# Patient Record
Sex: Female | Born: 1970 | Race: Black or African American | Hispanic: No | Marital: Single
Health system: Midwestern US, Academic
[De-identification: ages and names within clinical notes are randomized; demographics above are authoritative.]

## PROBLEM LIST (undated history)

## (undated) DIAGNOSIS — Z939 Artificial opening status, unspecified: Secondary | ICD-10-CM

## (undated) DIAGNOSIS — C181 Malignant neoplasm of appendix: Secondary | ICD-10-CM

## (undated) DIAGNOSIS — N159 Renal tubulo-interstitial disease, unspecified: Secondary | ICD-10-CM

## (undated) DIAGNOSIS — C19 Malignant neoplasm of rectosigmoid junction: Secondary | ICD-10-CM

## (undated) DIAGNOSIS — C189 Malignant neoplasm of colon, unspecified: Principal | ICD-10-CM

## (undated) HISTORY — PX: RECTOPERITONEAL FISTULA CLOSURE: SHX2314

## (undated) HISTORY — PX: ABDOMINAL SURGERY: SHX537

## (undated) HISTORY — PX: RENAL ARTERY STENT: SHX2321

## (undated) HISTORY — PX: OSTOMY TAKE DOWN: SHX2142

## (undated) HISTORY — DX: Malignant neoplasm of appendix: C18.1

---

## 2009-03-08 ENCOUNTER — Ambulatory Visit: Payer: Self-pay | Admitting: Diagnostic Radiology

## 2009-03-08 ENCOUNTER — Encounter: Payer: Self-pay | Admitting: Emergency Medicine

## 2009-03-09 ENCOUNTER — Inpatient Hospital Stay (HOSPITAL_COMMUNITY): Admission: EM | Admit: 2009-03-09 | Discharge: 2009-03-17 | Payer: Self-pay | Admitting: Emergency Medicine

## 2009-03-20 ENCOUNTER — Ambulatory Visit (HOSPITAL_COMMUNITY): Admission: RE | Admit: 2009-03-20 | Discharge: 2009-03-20 | Payer: Self-pay | Admitting: Surgery

## 2009-03-25 ENCOUNTER — Ambulatory Visit (HOSPITAL_COMMUNITY): Admission: RE | Admit: 2009-03-25 | Discharge: 2009-03-25 | Payer: Self-pay | Admitting: Surgery

## 2009-05-08 ENCOUNTER — Inpatient Hospital Stay (HOSPITAL_COMMUNITY): Admission: RE | Admit: 2009-05-08 | Discharge: 2009-05-18 | Payer: Self-pay | Admitting: Surgery

## 2009-05-08 ENCOUNTER — Encounter (INDEPENDENT_AMBULATORY_CARE_PROVIDER_SITE_OTHER): Payer: Self-pay | Admitting: Surgery

## 2009-05-20 ENCOUNTER — Ambulatory Visit: Payer: Self-pay | Admitting: Hematology and Oncology

## 2009-05-24 ENCOUNTER — Ambulatory Visit: Payer: Self-pay | Admitting: Hematology and Oncology

## 2009-05-30 ENCOUNTER — Ambulatory Visit (HOSPITAL_COMMUNITY): Admission: RE | Admit: 2009-05-30 | Discharge: 2009-05-30 | Payer: Self-pay | Admitting: Hematology and Oncology

## 2009-05-30 LAB — CBC WITH DIFFERENTIAL/PLATELET
HGB: 11.6 g/dL (ref 11.6–15.9)
MCH: 29.1 pg (ref 25.1–34.0)
MCHC: 33.6 g/dL (ref 31.5–36.0)
MCV: 86.4 fL (ref 79.5–101.0)
RBC: 3.98 10*6/uL (ref 3.70–5.45)
RDW: 14.7 % — ABNORMAL HIGH (ref 11.2–14.5)
lymph#: 2.2 10*3/uL (ref 0.9–3.3)

## 2009-05-30 LAB — COMPREHENSIVE METABOLIC PANEL
CO2: 28 mEq/L (ref 19–32)
Creatinine, Ser: 0.68 mg/dL (ref 0.40–1.20)
Glucose, Bld: 93 mg/dL (ref 70–99)
Total Bilirubin: 0.5 mg/dL (ref 0.3–1.2)

## 2009-05-30 LAB — CEA: CEA: 16.6 ng/mL — ABNORMAL HIGH (ref 0.0–5.0)

## 2009-06-03 ENCOUNTER — Ambulatory Visit (HOSPITAL_COMMUNITY): Admission: RE | Admit: 2009-06-03 | Discharge: 2009-06-03 | Payer: Self-pay | Admitting: Hematology and Oncology

## 2009-09-13 ENCOUNTER — Ambulatory Visit: Payer: Self-pay | Admitting: Hematology and Oncology

## 2009-09-17 LAB — COMPREHENSIVE METABOLIC PANEL
ALT: 18 U/L (ref 0–35)
AST: 20 U/L (ref 0–37)
Alkaline Phosphatase: 65 U/L (ref 39–117)
CO2: 26 mEq/L (ref 19–32)
Sodium: 142 mEq/L (ref 135–145)
Total Bilirubin: 0.4 mg/dL (ref 0.3–1.2)
Total Protein: 7.1 g/dL (ref 6.0–8.3)

## 2009-09-17 LAB — CBC WITH DIFFERENTIAL/PLATELET
BASO%: 0.4 % (ref 0.0–2.0)
EOS%: 1.8 % (ref 0.0–7.0)
LYMPH%: 21.6 % (ref 14.0–49.7)
MCH: 28.3 pg (ref 25.1–34.0)
MCHC: 33.1 g/dL (ref 31.5–36.0)
MONO#: 0.5 10*3/uL (ref 0.1–0.9)
MONO%: 10.9 % (ref 0.0–14.0)
Platelets: 398 10*3/uL (ref 145–400)
RBC: 4.32 10*6/uL (ref 3.70–5.45)
WBC: 4.7 10*3/uL (ref 3.9–10.3)

## 2009-09-28 ENCOUNTER — Emergency Department (HOSPITAL_BASED_OUTPATIENT_CLINIC_OR_DEPARTMENT_OTHER): Admission: EM | Admit: 2009-09-28 | Discharge: 2009-09-28 | Payer: Self-pay | Admitting: Emergency Medicine

## 2009-10-02 LAB — COMPREHENSIVE METABOLIC PANEL
Albumin: 4.6 g/dL (ref 3.5–5.2)
BUN: 13 mg/dL (ref 6–23)
CO2: 27 mEq/L (ref 19–32)
Calcium: 10.9 mg/dL — ABNORMAL HIGH (ref 8.4–10.5)
Chloride: 101 mEq/L (ref 96–112)
Creatinine, Ser: 0.85 mg/dL (ref 0.40–1.20)
Glucose, Bld: 122 mg/dL — ABNORMAL HIGH (ref 70–99)
Potassium: 3.8 mEq/L (ref 3.5–5.3)

## 2009-10-03 ENCOUNTER — Ambulatory Visit (HOSPITAL_BASED_OUTPATIENT_CLINIC_OR_DEPARTMENT_OTHER): Admission: RE | Admit: 2009-10-03 | Discharge: 2009-10-03 | Payer: Self-pay | Admitting: Surgery

## 2009-10-05 ENCOUNTER — Emergency Department (HOSPITAL_BASED_OUTPATIENT_CLINIC_OR_DEPARTMENT_OTHER): Admission: EM | Admit: 2009-10-05 | Discharge: 2009-10-05 | Payer: Self-pay | Admitting: Emergency Medicine

## 2009-10-07 ENCOUNTER — Emergency Department (HOSPITAL_BASED_OUTPATIENT_CLINIC_OR_DEPARTMENT_OTHER): Admission: EM | Admit: 2009-10-07 | Discharge: 2009-10-08 | Payer: Self-pay | Admitting: Emergency Medicine

## 2009-10-07 LAB — COMPREHENSIVE METABOLIC PANEL
AST: 12 U/L (ref 0–37)
Albumin: 4.2 g/dL (ref 3.5–5.2)
Alkaline Phosphatase: 61 U/L (ref 39–117)
BUN: 9 mg/dL (ref 6–23)
Calcium: 10.1 mg/dL (ref 8.4–10.5)
Chloride: 101 mEq/L (ref 96–112)
Glucose, Bld: 101 mg/dL — ABNORMAL HIGH (ref 70–99)
Potassium: 4 mEq/L (ref 3.5–5.3)
Sodium: 135 mEq/L (ref 135–145)
Total Protein: 7.5 g/dL (ref 6.0–8.3)

## 2009-10-07 LAB — CBC WITH DIFFERENTIAL/PLATELET
BASO%: 0.4 % (ref 0.0–2.0)
Basophils Absolute: 0 10*3/uL (ref 0.0–0.1)
EOS%: 5 % (ref 0.0–7.0)
HCT: 42 % (ref 34.8–46.6)
HGB: 13.9 g/dL (ref 11.6–15.9)
MONO#: 1 10*3/uL — ABNORMAL HIGH (ref 0.1–0.9)
NEUT%: 60.8 % (ref 38.4–76.8)
RDW: 16.4 % — ABNORMAL HIGH (ref 11.2–14.5)
WBC: 8.4 10*3/uL (ref 3.9–10.3)
lymph#: 1.9 10*3/uL (ref 0.9–3.3)

## 2009-10-15 ENCOUNTER — Ambulatory Visit: Payer: Self-pay | Admitting: Hematology and Oncology

## 2009-10-15 LAB — CBC WITH DIFFERENTIAL/PLATELET
BASO%: 0 % (ref 0.0–2.0)
EOS%: 5.7 % (ref 0.0–7.0)
MCH: 27.7 pg (ref 25.1–34.0)
MCHC: 32.9 g/dL (ref 31.5–36.0)
RDW: 16.2 % — ABNORMAL HIGH (ref 11.2–14.5)
lymph#: 1 10*3/uL (ref 0.9–3.3)

## 2009-10-22 LAB — CBC WITH DIFFERENTIAL/PLATELET
Basophils Absolute: 0 10*3/uL (ref 0.0–0.1)
EOS%: 10.6 % — ABNORMAL HIGH (ref 0.0–7.0)
Eosinophils Absolute: 0.5 10*3/uL (ref 0.0–0.5)
HGB: 12.1 g/dL (ref 11.6–15.9)
NEUT#: 2.8 10*3/uL (ref 1.5–6.5)
RDW: 18.3 % — ABNORMAL HIGH (ref 11.2–14.5)
WBC: 4.3 10*3/uL (ref 3.9–10.3)
lymph#: 0.4 10*3/uL — ABNORMAL LOW (ref 0.9–3.3)

## 2009-10-29 LAB — CBC WITH DIFFERENTIAL/PLATELET
Eosinophils Absolute: 0.2 10*3/uL (ref 0.0–0.5)
LYMPH%: 7.1 % — ABNORMAL LOW (ref 14.0–49.7)
MCV: 83.8 fL (ref 79.5–101.0)
MONO%: 19.2 % — ABNORMAL HIGH (ref 0.0–14.0)
NEUT#: 3.5 10*3/uL (ref 1.5–6.5)
NEUT%: 68.8 % (ref 38.4–76.8)
Platelets: 235 10*3/uL (ref 145–400)
RBC: 4.02 10*6/uL (ref 3.70–5.45)
nRBC: 0 % (ref 0–0)

## 2009-10-29 LAB — BASIC METABOLIC PANEL
BUN: 10 mg/dL (ref 6–23)
Chloride: 104 mEq/L (ref 96–112)
Glucose, Bld: 106 mg/dL — ABNORMAL HIGH (ref 70–99)
Potassium: 3.7 mEq/L (ref 3.5–5.3)

## 2009-11-04 ENCOUNTER — Emergency Department (HOSPITAL_BASED_OUTPATIENT_CLINIC_OR_DEPARTMENT_OTHER): Admission: EM | Admit: 2009-11-04 | Discharge: 2009-11-05 | Payer: Self-pay | Admitting: Emergency Medicine

## 2009-11-05 LAB — CBC WITH DIFFERENTIAL/PLATELET
BASO%: 0.1 % (ref 0.0–2.0)
EOS%: 8.2 % — ABNORMAL HIGH (ref 0.0–7.0)
HCT: 33.5 % — ABNORMAL LOW (ref 34.8–46.6)
LYMPH%: 9.1 % — ABNORMAL LOW (ref 14.0–49.7)
MCH: 29.6 pg (ref 25.1–34.0)
MCHC: 33.7 g/dL (ref 31.5–36.0)
MCV: 87.8 fL (ref 79.5–101.0)
MONO%: 13.5 % (ref 0.0–14.0)
NEUT%: 69.1 % (ref 38.4–76.8)
Platelets: 310 10*3/uL (ref 145–400)
lymph#: 0.5 10*3/uL — ABNORMAL LOW (ref 0.9–3.3)

## 2009-11-05 LAB — COMPREHENSIVE METABOLIC PANEL
ALT: 42 U/L — ABNORMAL HIGH (ref 0–35)
CO2: 24 mEq/L (ref 19–32)
Calcium: 8.6 mg/dL (ref 8.4–10.5)
Chloride: 108 mEq/L (ref 96–112)
Sodium: 138 mEq/L (ref 135–145)
Total Bilirubin: 0.7 mg/dL (ref 0.3–1.2)
Total Protein: 6 g/dL (ref 6.0–8.3)

## 2009-11-09 ENCOUNTER — Emergency Department (HOSPITAL_BASED_OUTPATIENT_CLINIC_OR_DEPARTMENT_OTHER): Admission: EM | Admit: 2009-11-09 | Discharge: 2009-11-09 | Payer: Self-pay | Admitting: Emergency Medicine

## 2009-11-09 ENCOUNTER — Ambulatory Visit: Payer: Self-pay | Admitting: Diagnostic Radiology

## 2009-11-12 ENCOUNTER — Emergency Department (HOSPITAL_BASED_OUTPATIENT_CLINIC_OR_DEPARTMENT_OTHER)
Admission: EM | Admit: 2009-11-12 | Discharge: 2009-11-12 | Payer: Self-pay | Source: Home / Self Care | Admitting: Emergency Medicine

## 2009-11-14 ENCOUNTER — Inpatient Hospital Stay (HOSPITAL_COMMUNITY): Admission: EM | Admit: 2009-11-14 | Discharge: 2009-11-18 | Payer: Self-pay | Admitting: Emergency Medicine

## 2009-12-14 ENCOUNTER — Emergency Department (HOSPITAL_BASED_OUTPATIENT_CLINIC_OR_DEPARTMENT_OTHER): Admission: EM | Admit: 2009-12-14 | Discharge: 2009-12-14 | Payer: Self-pay | Admitting: Emergency Medicine

## 2009-12-19 ENCOUNTER — Ambulatory Visit: Payer: Self-pay | Admitting: Hematology and Oncology

## 2009-12-24 LAB — COMPREHENSIVE METABOLIC PANEL
ALT: 11 U/L (ref 0–35)
CO2: 20 mEq/L (ref 19–32)
Calcium: 10.3 mg/dL (ref 8.4–10.5)
Chloride: 104 mEq/L (ref 96–112)
Creatinine, Ser: 0.81 mg/dL (ref 0.40–1.20)
Glucose, Bld: 129 mg/dL — ABNORMAL HIGH (ref 70–99)
Total Protein: 7.9 g/dL (ref 6.0–8.3)

## 2009-12-24 LAB — CBC WITH DIFFERENTIAL/PLATELET
BASO%: 0.2 % (ref 0.0–2.0)
Eosinophils Absolute: 0 10*3/uL (ref 0.0–0.5)
HCT: 39.5 % (ref 34.8–46.6)
HGB: 13 g/dL (ref 11.6–15.9)
MCHC: 32.8 g/dL (ref 31.5–36.0)
MONO#: 0.8 10*3/uL (ref 0.1–0.9)
NEUT#: 4.4 10*3/uL (ref 1.5–6.5)
NEUT%: 65.8 % (ref 38.4–76.8)
WBC: 6.6 10*3/uL (ref 3.9–10.3)
lymph#: 1.5 10*3/uL (ref 0.9–3.3)

## 2009-12-24 LAB — CEA: CEA: 2.2 ng/mL (ref 0.0–5.0)

## 2010-01-14 LAB — COMPREHENSIVE METABOLIC PANEL
ALT: 12 U/L (ref 0–35)
AST: 15 U/L (ref 0–37)
Albumin: 4 g/dL (ref 3.5–5.2)
Alkaline Phosphatase: 98 U/L (ref 39–117)
BUN: 5 mg/dL — ABNORMAL LOW (ref 6–23)
CO2: 27 mEq/L (ref 19–32)
Calcium: 9.2 mg/dL (ref 8.4–10.5)
Chloride: 103 mEq/L (ref 96–112)
Creatinine, Ser: 0.82 mg/dL (ref 0.40–1.20)
Glucose, Bld: 87 mg/dL (ref 70–99)
Potassium: 3 mEq/L — ABNORMAL LOW (ref 3.5–5.3)
Sodium: 142 mEq/L (ref 135–145)
Total Bilirubin: 0.3 mg/dL (ref 0.3–1.2)
Total Protein: 6.7 g/dL (ref 6.0–8.3)

## 2010-01-14 LAB — CBC WITH DIFFERENTIAL/PLATELET
BASO%: 0 % (ref 0.0–2.0)
Basophils Absolute: 0 10*3/uL (ref 0.0–0.1)
EOS%: 0.3 % (ref 0.0–7.0)
Eosinophils Absolute: 0 10*3/uL (ref 0.0–0.5)
HCT: 37.8 % (ref 34.8–46.6)
HGB: 12.7 g/dL (ref 11.6–15.9)
LYMPH%: 8.8 % — ABNORMAL LOW (ref 14.0–49.7)
MCH: 30.3 pg (ref 25.1–34.0)
MCHC: 33.6 g/dL (ref 31.5–36.0)
MCV: 89.9 fL (ref 79.5–101.0)
MONO#: 0.4 10*3/uL (ref 0.1–0.9)
MONO%: 4.9 % (ref 0.0–14.0)
NEUT#: 7.8 10*3/uL — ABNORMAL HIGH (ref 1.5–6.5)
NEUT%: 86 % — ABNORMAL HIGH (ref 38.4–76.8)
Platelets: 214 10*3/uL (ref 145–400)
RBC: 4.2 10*6/uL (ref 3.70–5.45)
RDW: 14.6 % — ABNORMAL HIGH (ref 11.2–14.5)
WBC: 9.1 10*3/uL (ref 3.9–10.3)
lymph#: 0.8 10*3/uL — ABNORMAL LOW (ref 0.9–3.3)

## 2010-01-21 ENCOUNTER — Ambulatory Visit: Payer: Self-pay | Admitting: Hematology and Oncology

## 2010-01-22 ENCOUNTER — Emergency Department (HOSPITAL_BASED_OUTPATIENT_CLINIC_OR_DEPARTMENT_OTHER): Admission: EM | Admit: 2010-01-22 | Discharge: 2010-01-22 | Payer: Self-pay | Admitting: Emergency Medicine

## 2010-01-29 LAB — COMPREHENSIVE METABOLIC PANEL
ALT: 25 U/L (ref 0–35)
AST: 25 U/L (ref 0–37)
Albumin: 3.7 g/dL (ref 3.5–5.2)
Alkaline Phosphatase: 103 U/L (ref 39–117)
BUN: 7 mg/dL (ref 6–23)
CO2: 29 mEq/L (ref 19–32)
Calcium: 9.4 mg/dL (ref 8.4–10.5)
Chloride: 101 mEq/L (ref 96–112)
Creatinine, Ser: 0.7 mg/dL (ref 0.40–1.20)
Glucose, Bld: 76 mg/dL (ref 70–99)
Potassium: 2.8 mEq/L — ABNORMAL LOW (ref 3.5–5.3)
Sodium: 141 mEq/L (ref 135–145)
Total Bilirubin: 0.4 mg/dL (ref 0.3–1.2)
Total Protein: 5.7 g/dL — ABNORMAL LOW (ref 6.0–8.3)

## 2010-01-29 LAB — CBC WITH DIFFERENTIAL/PLATELET
BASO%: 0.1 % (ref 0.0–2.0)
Basophils Absolute: 0 10*3/uL (ref 0.0–0.1)
EOS%: 0.3 % (ref 0.0–7.0)
Eosinophils Absolute: 0 10*3/uL (ref 0.0–0.5)
HCT: 36.3 % (ref 34.8–46.6)
HGB: 11.6 g/dL (ref 11.6–15.9)
LYMPH%: 8.8 % — ABNORMAL LOW (ref 14.0–49.7)
MCH: 29.8 pg (ref 25.1–34.0)
MCHC: 32 g/dL (ref 31.5–36.0)
MCV: 93.3 fL (ref 79.5–101.0)
MONO#: 0.8 10*3/uL (ref 0.1–0.9)
MONO%: 8.4 % (ref 0.0–14.0)
NEUT#: 7.7 10*3/uL — ABNORMAL HIGH (ref 1.5–6.5)
NEUT%: 82.4 % — ABNORMAL HIGH (ref 38.4–76.8)
Platelets: 156 10*3/uL (ref 145–400)
RBC: 3.89 10*6/uL (ref 3.70–5.45)
RDW: 15.5 % — ABNORMAL HIGH (ref 11.2–14.5)
WBC: 9.4 10*3/uL (ref 3.9–10.3)
lymph#: 0.8 10*3/uL — ABNORMAL LOW (ref 0.9–3.3)

## 2010-02-12 ENCOUNTER — Emergency Department (HOSPITAL_COMMUNITY)
Admission: EM | Admit: 2010-02-12 | Discharge: 2010-02-12 | Payer: Self-pay | Source: Home / Self Care | Admitting: Emergency Medicine

## 2010-02-12 LAB — CBC WITH DIFFERENTIAL/PLATELET
BASO%: 0.2 % (ref 0.0–2.0)
Basophils Absolute: 0 10*3/uL (ref 0.0–0.1)
EOS%: 1.2 % (ref 0.0–7.0)
HGB: 12.1 g/dL (ref 11.6–15.9)
MCH: 30.3 pg (ref 25.1–34.0)
MCHC: 32.3 g/dL (ref 31.5–36.0)
MCV: 93.8 fL (ref 79.5–101.0)
MONO%: 16.9 % — ABNORMAL HIGH (ref 0.0–14.0)
RDW: 15.8 % — ABNORMAL HIGH (ref 11.2–14.5)
lymph#: 0.7 10*3/uL — ABNORMAL LOW (ref 0.9–3.3)

## 2010-02-12 LAB — COMPREHENSIVE METABOLIC PANEL
ALT: 22 U/L (ref 0–35)
AST: 24 U/L (ref 0–37)
Albumin: 3.8 g/dL (ref 3.5–5.2)
Alkaline Phosphatase: 77 U/L (ref 39–117)
BUN: 5 mg/dL — ABNORMAL LOW (ref 6–23)
Calcium: 9.4 mg/dL (ref 8.4–10.5)
Chloride: 104 mEq/L (ref 96–112)
Potassium: 3.4 mEq/L — ABNORMAL LOW (ref 3.5–5.3)
Sodium: 141 mEq/L (ref 135–145)
Total Protein: 6.3 g/dL (ref 6.0–8.3)

## 2010-02-21 ENCOUNTER — Ambulatory Visit: Payer: Self-pay | Admitting: Hematology and Oncology

## 2010-02-25 LAB — CBC WITH DIFFERENTIAL/PLATELET
EOS%: 0.7 % (ref 0.0–7.0)
Eosinophils Absolute: 0 10*3/uL (ref 0.0–0.5)
HGB: 13.2 g/dL (ref 11.6–15.9)
MCH: 31.4 pg (ref 25.1–34.0)
MCV: 92.1 fL (ref 79.5–101.0)
MONO%: 7.8 % (ref 0.0–14.0)
NEUT#: 3.6 10*3/uL (ref 1.5–6.5)
RBC: 4.2 10*6/uL (ref 3.70–5.45)
RDW: 15.8 % — ABNORMAL HIGH (ref 11.2–14.5)
lymph#: 0.4 10*3/uL — ABNORMAL LOW (ref 0.9–3.3)

## 2010-02-25 LAB — COMPREHENSIVE METABOLIC PANEL
ALT: 12 U/L (ref 0–35)
AST: 17 U/L (ref 0–37)
Albumin: 3.8 g/dL (ref 3.5–5.2)
Alkaline Phosphatase: 81 U/L (ref 39–117)
Calcium: 9.5 mg/dL (ref 8.4–10.5)
Chloride: 101 mEq/L (ref 96–112)
Potassium: 3.4 mEq/L — ABNORMAL LOW (ref 3.5–5.3)
Sodium: 139 mEq/L (ref 135–145)
Total Protein: 6.4 g/dL (ref 6.0–8.3)

## 2010-03-11 LAB — CBC WITH DIFFERENTIAL/PLATELET
Basophils Absolute: 0 10*3/uL (ref 0.0–0.1)
EOS%: 0.7 % (ref 0.0–7.0)
HCT: 38.4 % (ref 34.8–46.6)
HGB: 12.8 g/dL (ref 11.6–15.9)
LYMPH%: 15.5 % (ref 14.0–49.7)
MCH: 30.8 pg (ref 25.1–34.0)
MCV: 92.5 fL (ref 79.5–101.0)
MONO%: 12.7 % (ref 0.0–14.0)
NEUT%: 70.9 % (ref 38.4–76.8)
Platelets: 217 10*3/uL (ref 145–400)
lymph#: 0.8 10*3/uL — ABNORMAL LOW (ref 0.9–3.3)

## 2010-03-11 LAB — COMPREHENSIVE METABOLIC PANEL
AST: 20 U/L (ref 0–37)
BUN: 6 mg/dL (ref 6–23)
Calcium: 9.7 mg/dL (ref 8.4–10.5)
Chloride: 102 mEq/L (ref 96–112)
Creatinine, Ser: 0.79 mg/dL (ref 0.40–1.20)
Total Bilirubin: 0.4 mg/dL (ref 0.3–1.2)

## 2010-03-21 ENCOUNTER — Ambulatory Visit: Payer: Self-pay | Admitting: Hematology and Oncology

## 2010-03-25 LAB — COMPREHENSIVE METABOLIC PANEL
AST: 18 U/L (ref 0–37)
Albumin: 4.1 g/dL (ref 3.5–5.2)
BUN: 5 mg/dL — ABNORMAL LOW (ref 6–23)
CO2: 28 mEq/L (ref 19–32)
Calcium: 9.7 mg/dL (ref 8.4–10.5)
Chloride: 100 mEq/L (ref 96–112)
Creatinine, Ser: 0.64 mg/dL (ref 0.40–1.20)
Glucose, Bld: 107 mg/dL — ABNORMAL HIGH (ref 70–99)
Potassium: 3.3 mEq/L — ABNORMAL LOW (ref 3.5–5.3)

## 2010-03-25 LAB — CBC WITH DIFFERENTIAL/PLATELET
Basophils Absolute: 0 10*3/uL (ref 0.0–0.1)
EOS%: 0.3 % (ref 0.0–7.0)
Eosinophils Absolute: 0 10*3/uL (ref 0.0–0.5)
HCT: 42.1 % (ref 34.8–46.6)
HGB: 13.9 g/dL (ref 11.6–15.9)
MCH: 30.3 pg (ref 25.1–34.0)
NEUT#: 4.4 10*3/uL (ref 1.5–6.5)
NEUT%: 80 % — ABNORMAL HIGH (ref 38.4–76.8)
RDW: 16.6 % — ABNORMAL HIGH (ref 11.2–14.5)
lymph#: 0.6 10*3/uL — ABNORMAL LOW (ref 0.9–3.3)

## 2010-03-26 LAB — UA PROTEIN, DIPSTICK - CHCC: Protein, Urine: 300 mg/dL

## 2010-04-15 ENCOUNTER — Ambulatory Visit: Payer: Self-pay | Admitting: Hematology and Oncology

## 2010-04-15 LAB — CBC WITH DIFFERENTIAL/PLATELET
BASO%: 0.5 % (ref 0.0–2.0)
Basophils Absolute: 0 10*3/uL (ref 0.0–0.1)
EOS%: 0.9 % (ref 0.0–7.0)
Eosinophils Absolute: 0 10*3/uL (ref 0.0–0.5)
HCT: 37.9 % (ref 34.8–46.6)
HGB: 12.7 g/dL (ref 11.6–15.9)
LYMPH%: 14.8 % (ref 14.0–49.7)
MCH: 30.5 pg (ref 25.1–34.0)
MCHC: 33.5 g/dL (ref 31.5–36.0)
MCV: 91 fL (ref 79.5–101.0)
MONO#: 0.5 10*3/uL (ref 0.1–0.9)
MONO%: 12.1 % (ref 0.0–14.0)
NEUT#: 2.8 10*3/uL (ref 1.5–6.5)
NEUT%: 71.7 % (ref 38.4–76.8)
Platelets: 326 10*3/uL (ref 145–400)
RBC: 4.16 10*6/uL (ref 3.70–5.45)
RDW: 16.2 % — ABNORMAL HIGH (ref 11.2–14.5)
WBC: 3.9 10*3/uL (ref 3.9–10.3)
lymph#: 0.6 10*3/uL — ABNORMAL LOW (ref 0.9–3.3)

## 2010-04-15 LAB — COMPREHENSIVE METABOLIC PANEL
ALT: 9 U/L (ref 0–35)
AST: 15 U/L (ref 0–37)
Albumin: 3.9 g/dL (ref 3.5–5.2)
Alkaline Phosphatase: 75 U/L (ref 39–117)
BUN: 8 mg/dL (ref 6–23)
CO2: 27 mEq/L (ref 19–32)
Calcium: 10.2 mg/dL (ref 8.4–10.5)
Chloride: 102 mEq/L (ref 96–112)
Creatinine, Ser: 0.62 mg/dL (ref 0.40–1.20)
Glucose, Bld: 105 mg/dL — ABNORMAL HIGH (ref 70–99)
Potassium: 3.8 mEq/L (ref 3.5–5.3)
Sodium: 140 mEq/L (ref 135–145)
Total Bilirubin: 0.5 mg/dL (ref 0.3–1.2)
Total Protein: 6.8 g/dL (ref 6.0–8.3)

## 2010-04-15 LAB — UA PROTEIN, DIPSTICK - CHCC: Protein, Urine: <30 mg/dL

## 2010-04-26 ENCOUNTER — Emergency Department (HOSPITAL_COMMUNITY)
Admission: EM | Admit: 2010-04-26 | Discharge: 2010-04-26 | Payer: Self-pay | Source: Home / Self Care | Admitting: Emergency Medicine

## 2010-04-28 LAB — URINE MICROSCOPIC-ADD ON

## 2010-04-28 LAB — URINALYSIS, ROUTINE W REFLEX MICROSCOPIC
Bilirubin Urine: NEGATIVE
Hgb urine dipstick: NEGATIVE
Ketones, ur: NEGATIVE mg/dL
Leukocytes, UA: NEGATIVE
Nitrite: NEGATIVE
Protein, ur: 30 mg/dL — AB
Specific Gravity, Urine: 1.025 (ref 1.005–1.030)
Urine Glucose, Fasting: NEGATIVE mg/dL
Urobilinogen, UA: 0.2 mg/dL (ref 0.0–1.0)
pH: 6 (ref 5.0–8.0)

## 2010-04-28 LAB — COMPREHENSIVE METABOLIC PANEL
ALT: 11 U/L (ref 0–35)
AST: 20 U/L (ref 0–37)
Albumin: 3.8 g/dL (ref 3.5–5.2)
Alkaline Phosphatase: 99 U/L (ref 39–117)
BUN: 6 mg/dL (ref 6–23)
CO2: 23 mEq/L (ref 19–32)
Calcium: 9.6 mg/dL (ref 8.4–10.5)
Chloride: 103 mEq/L (ref 96–112)
Creatinine, Ser: 0.56 mg/dL (ref 0.4–1.2)
GFR calc Af Amer: >60 mL/min (ref >60)
GFR calc non Af Amer: >60 mL/min (ref >60)
Glucose, Bld: 85 mg/dL (ref 70–99)
Potassium: 3.1 mEq/L — ABNORMAL LOW (ref 3.5–5.1)
Sodium: 140 mEq/L (ref 135–145)
Total Bilirubin: 0.6 mg/dL (ref 0.3–1.2)
Total Protein: 7.5 g/dL (ref 6.0–8.3)

## 2010-04-28 LAB — OCCULT BLOOD, POC DEVICE: Fecal Occult Bld: NEGATIVE

## 2010-04-28 LAB — CBC
HCT: 44.8 % (ref 36.0–46.0)
Hemoglobin: 14.6 g/dL (ref 12.0–15.0)
MCH: 30.5 pg (ref 26.0–34.0)
MCHC: 32.6 g/dL (ref 30.0–36.0)
MCV: 93.7 fL (ref 78.0–100.0)
Platelets: 214 10*3/uL (ref 150–400)
RBC: 4.78 MIL/uL (ref 3.87–5.11)
RDW: 15.2 % (ref 11.5–15.5)
WBC: 6 10*3/uL (ref 4.0–10.5)

## 2010-04-28 LAB — DIFFERENTIAL
Basophils Absolute: 0 10*3/uL (ref 0.0–0.1)
Basophils Relative: 0 % (ref 0–1)
Eosinophils Absolute: 0.1 10*3/uL (ref 0.0–0.7)
Eosinophils Relative: 1 % (ref 0–5)
Lymphocytes Relative: 17 % (ref 12–46)
Lymphs Abs: 1 10*3/uL (ref 0.7–4.0)
Monocytes Absolute: 0.7 10*3/uL (ref 0.1–1.0)
Monocytes Relative: 11 % (ref 3–12)
Neutro Abs: 4.2 10*3/uL (ref 1.7–7.7)
Neutrophils Relative %: 71 % (ref 43–77)

## 2010-04-30 LAB — COMPREHENSIVE METABOLIC PANEL
ALT: <8 U/L (ref 0–35)
AST: 15 U/L (ref 0–37)
Albumin: 3.7 g/dL (ref 3.5–5.2)
Alkaline Phosphatase: 69 U/L (ref 39–117)
BUN: 4 mg/dL — ABNORMAL LOW (ref 6–23)
CO2: 27 mEq/L (ref 19–32)
Calcium: 9.1 mg/dL (ref 8.4–10.5)
Chloride: 101 mEq/L (ref 96–112)
Creatinine, Ser: 0.59 mg/dL (ref 0.40–1.20)
Glucose, Bld: 101 mg/dL — ABNORMAL HIGH (ref 70–99)
Potassium: 3.4 mEq/L — ABNORMAL LOW (ref 3.5–5.3)
Sodium: 139 mEq/L (ref 135–145)
Total Bilirubin: 0.4 mg/dL (ref 0.3–1.2)
Total Protein: 6.3 g/dL (ref 6.0–8.3)

## 2010-04-30 LAB — CBC WITH DIFFERENTIAL/PLATELET
BASO%: 0 % (ref 0.0–2.0)
Basophils Absolute: 0 10*3/uL (ref 0.0–0.1)
EOS%: 0.2 % (ref 0.0–7.0)
Eosinophils Absolute: 0 10*3/uL (ref 0.0–0.5)
HCT: 36.2 % (ref 34.8–46.6)
HGB: 11.7 g/dL (ref 11.6–15.9)
LYMPH%: 8.3 % — ABNORMAL LOW (ref 14.0–49.7)
MCH: 29.4 pg (ref 25.1–34.0)
MCHC: 32.3 g/dL (ref 31.5–36.0)
MCV: 91 fL (ref 79.5–101.0)
MONO#: 0.8 10*3/uL (ref 0.1–0.9)
MONO%: 17 % — ABNORMAL HIGH (ref 0.0–14.0)
NEUT#: 3.6 10*3/uL (ref 1.5–6.5)
NEUT%: 74.5 % (ref 38.4–76.8)
Platelets: 139 10*3/uL — ABNORMAL LOW (ref 145–400)
RBC: 3.98 10*6/uL (ref 3.70–5.45)
RDW: 15 % — ABNORMAL HIGH (ref 11.2–14.5)
WBC: 4.8 10*3/uL (ref 3.9–10.3)
lymph#: 0.4 10*3/uL — ABNORMAL LOW (ref 0.9–3.3)

## 2010-04-30 LAB — UA PROTEIN, DIPSTICK - CHCC: Protein, Urine: 30 mg/dL

## 2010-05-04 ENCOUNTER — Encounter: Payer: Self-pay | Admitting: Hematology and Oncology

## 2010-05-13 LAB — CBC WITH DIFFERENTIAL/PLATELET
Basophils Absolute: 0 10*3/uL (ref 0.0–0.1)
Eosinophils Absolute: 0 10*3/uL (ref 0.0–0.5)
HCT: 36 % (ref 34.8–46.6)
LYMPH%: 12.2 % — ABNORMAL LOW (ref 14.0–49.7)
MCV: 90.7 fL (ref 79.5–101.0)
MONO#: 0.3 10*3/uL (ref 0.1–0.9)
MONO%: 10.2 % (ref 0.0–14.0)
NEUT#: 2.6 10*3/uL (ref 1.5–6.5)
NEUT%: 76.4 % (ref 38.4–76.8)
Platelets: 261 10*3/uL (ref 145–400)
WBC: 3.3 10*3/uL — ABNORMAL LOW (ref 3.9–10.3)

## 2010-05-13 LAB — COMPREHENSIVE METABOLIC PANEL
ALT: 8 U/L (ref 0–35)
BUN: 8 mg/dL (ref 6–23)
CO2: 26 mEq/L (ref 19–32)
Calcium: 9.3 mg/dL (ref 8.4–10.5)
Chloride: 102 mEq/L (ref 96–112)
Creatinine, Ser: 0.61 mg/dL (ref 0.40–1.20)
Glucose, Bld: 91 mg/dL (ref 70–99)

## 2010-05-14 ENCOUNTER — Encounter (HOSPITAL_BASED_OUTPATIENT_CLINIC_OR_DEPARTMENT_OTHER): Payer: Self-pay | Admitting: Hematology and Oncology

## 2010-05-14 DIAGNOSIS — Z5111 Encounter for antineoplastic chemotherapy: Secondary | ICD-10-CM

## 2010-05-14 DIAGNOSIS — C20 Malignant neoplasm of rectum: Secondary | ICD-10-CM

## 2010-05-16 ENCOUNTER — Encounter (HOSPITAL_BASED_OUTPATIENT_CLINIC_OR_DEPARTMENT_OTHER): Payer: Medicaid Other | Admitting: Hematology and Oncology

## 2010-05-16 DIAGNOSIS — Z5189 Encounter for other specified aftercare: Secondary | ICD-10-CM

## 2010-05-16 DIAGNOSIS — C20 Malignant neoplasm of rectum: Secondary | ICD-10-CM

## 2010-05-17 ENCOUNTER — Encounter (HOSPITAL_BASED_OUTPATIENT_CLINIC_OR_DEPARTMENT_OTHER): Payer: Medicaid Other | Admitting: Hematology and Oncology

## 2010-05-17 DIAGNOSIS — C20 Malignant neoplasm of rectum: Secondary | ICD-10-CM

## 2010-05-17 DIAGNOSIS — Z5189 Encounter for other specified aftercare: Secondary | ICD-10-CM

## 2010-05-18 ENCOUNTER — Encounter (HOSPITAL_BASED_OUTPATIENT_CLINIC_OR_DEPARTMENT_OTHER): Payer: Medicaid Other | Admitting: Hematology and Oncology

## 2010-05-18 DIAGNOSIS — Z5189 Encounter for other specified aftercare: Secondary | ICD-10-CM

## 2010-05-18 DIAGNOSIS — C20 Malignant neoplasm of rectum: Secondary | ICD-10-CM

## 2010-05-19 ENCOUNTER — Encounter (HOSPITAL_BASED_OUTPATIENT_CLINIC_OR_DEPARTMENT_OTHER): Payer: Self-pay | Admitting: Hematology and Oncology

## 2010-05-19 DIAGNOSIS — C20 Malignant neoplasm of rectum: Secondary | ICD-10-CM

## 2010-05-19 DIAGNOSIS — Z5189 Encounter for other specified aftercare: Secondary | ICD-10-CM

## 2010-05-20 ENCOUNTER — Encounter (HOSPITAL_BASED_OUTPATIENT_CLINIC_OR_DEPARTMENT_OTHER): Payer: Medicaid Other | Admitting: Hematology and Oncology

## 2010-05-20 DIAGNOSIS — C20 Malignant neoplasm of rectum: Secondary | ICD-10-CM

## 2010-05-20 DIAGNOSIS — Z5189 Encounter for other specified aftercare: Secondary | ICD-10-CM

## 2010-06-06 ENCOUNTER — Emergency Department (HOSPITAL_BASED_OUTPATIENT_CLINIC_OR_DEPARTMENT_OTHER)
Admission: EM | Admit: 2010-06-06 | Discharge: 2010-06-06 | Disposition: A | Discharge status: Home / Self Care | Payer: Medicaid Other | Attending: Emergency Medicine | Admitting: Emergency Medicine

## 2010-06-06 DIAGNOSIS — Z9889 Other specified postprocedural states: Secondary | ICD-10-CM | POA: Insufficient documentation

## 2010-06-06 DIAGNOSIS — Z932 Ileostomy status: Secondary | ICD-10-CM | POA: Insufficient documentation

## 2010-06-06 DIAGNOSIS — K6289 Other specified diseases of anus and rectum: Secondary | ICD-10-CM | POA: Insufficient documentation

## 2010-06-06 DIAGNOSIS — Z85038 Personal history of other malignant neoplasm of large intestine: Secondary | ICD-10-CM | POA: Insufficient documentation

## 2010-06-13 ENCOUNTER — Emergency Department (HOSPITAL_COMMUNITY)
Admission: EM | Admit: 2010-06-13 | Discharge: 2010-06-13 | Disposition: A | Discharge status: Home / Self Care | Payer: Medicaid Other | Attending: Emergency Medicine | Admitting: Emergency Medicine

## 2010-06-13 DIAGNOSIS — Z933 Colostomy status: Secondary | ICD-10-CM | POA: Insufficient documentation

## 2010-06-13 DIAGNOSIS — K6289 Other specified diseases of anus and rectum: Secondary | ICD-10-CM | POA: Insufficient documentation

## 2010-06-13 DIAGNOSIS — C189 Malignant neoplasm of colon, unspecified: Secondary | ICD-10-CM | POA: Insufficient documentation

## 2010-06-13 DIAGNOSIS — Z9221 Personal history of antineoplastic chemotherapy: Secondary | ICD-10-CM | POA: Insufficient documentation

## 2010-06-19 ENCOUNTER — Emergency Department (HOSPITAL_BASED_OUTPATIENT_CLINIC_OR_DEPARTMENT_OTHER)
Admission: EM | Admit: 2010-06-19 | Discharge: 2010-06-19 | Disposition: A | Discharge status: Home / Self Care | Payer: Medicaid Other | Attending: Emergency Medicine | Admitting: Emergency Medicine

## 2010-06-19 DIAGNOSIS — R5383 Other fatigue: Secondary | ICD-10-CM | POA: Insufficient documentation

## 2010-06-19 DIAGNOSIS — A499 Bacterial infection, unspecified: Secondary | ICD-10-CM | POA: Insufficient documentation

## 2010-06-19 DIAGNOSIS — N390 Urinary tract infection, site not specified: Secondary | ICD-10-CM | POA: Insufficient documentation

## 2010-06-19 DIAGNOSIS — N760 Acute vaginitis: Secondary | ICD-10-CM | POA: Insufficient documentation

## 2010-06-19 DIAGNOSIS — D649 Anemia, unspecified: Secondary | ICD-10-CM | POA: Insufficient documentation

## 2010-06-19 DIAGNOSIS — B9689 Other specified bacterial agents as the cause of diseases classified elsewhere: Secondary | ICD-10-CM | POA: Insufficient documentation

## 2010-06-19 DIAGNOSIS — R5381 Other malaise: Secondary | ICD-10-CM | POA: Insufficient documentation

## 2010-06-19 LAB — DIFFERENTIAL
Basophils Absolute: 0 10*3/uL (ref 0.0–0.1)
Basophils Relative: 0 % (ref 0–1)
Eosinophils Absolute: 0 10*3/uL (ref 0.0–0.7)
Neutro Abs: 6.4 10*3/uL (ref 1.7–7.7)
Neutrophils Relative %: 78 % — ABNORMAL HIGH (ref 43–77)

## 2010-06-19 LAB — URINALYSIS, ROUTINE W REFLEX MICROSCOPIC
Bilirubin Urine: NEGATIVE
Glucose, UA: NEGATIVE mg/dL
Ketones, ur: NEGATIVE mg/dL
Protein, ur: NEGATIVE mg/dL
pH: 6 (ref 5.0–8.0)

## 2010-06-19 LAB — BASIC METABOLIC PANEL
BUN: 13 mg/dL (ref 6–23)
CO2: 27 mEq/L (ref 19–32)
Calcium: 9.5 mg/dL (ref 8.4–10.5)
Creatinine, Ser: 0.8 mg/dL (ref 0.4–1.2)
GFR calc Af Amer: >60 mL/min (ref >60)

## 2010-06-19 LAB — CBC
Hemoglobin: 10.2 g/dL — ABNORMAL LOW (ref 12.0–15.0)
Platelets: 398 10*3/uL (ref 150–400)
RBC: 3.72 MIL/uL — ABNORMAL LOW (ref 3.87–5.11)

## 2010-06-19 LAB — URINE MICROSCOPIC-ADD ON

## 2010-06-19 LAB — WET PREP, GENITAL
Trich, Wet Prep: NONE SEEN
Yeast Wet Prep HPF POC: NONE SEEN

## 2010-06-19 LAB — PREGNANCY, URINE: Preg Test, Ur: NEGATIVE

## 2010-06-20 LAB — GC/CHLAMYDIA PROBE AMP, GENITAL: Chlamydia, DNA Probe: NEGATIVE

## 2010-06-23 LAB — URINE CULTURE: Culture  Setup Time: 201203090607

## 2010-06-24 ENCOUNTER — Encounter (HOSPITAL_COMMUNITY): Payer: Self-pay | Admitting: Radiology

## 2010-06-24 ENCOUNTER — Emergency Department (HOSPITAL_COMMUNITY): Payer: Medicaid Other

## 2010-06-24 ENCOUNTER — Inpatient Hospital Stay (HOSPITAL_COMMUNITY)
Admission: EM | Admit: 2010-06-24 | Discharge: 2010-06-29 | DRG: 389 | Disposition: A | Discharge status: Home / Self Care | Payer: Medicaid Other | Attending: Internal Medicine | Admitting: Internal Medicine

## 2010-06-24 DIAGNOSIS — Z923 Personal history of irradiation: Secondary | ICD-10-CM

## 2010-06-24 DIAGNOSIS — R195 Other fecal abnormalities: Secondary | ICD-10-CM | POA: Diagnosis present

## 2010-06-24 DIAGNOSIS — C20 Malignant neoplasm of rectum: Secondary | ICD-10-CM | POA: Diagnosis present

## 2010-06-24 DIAGNOSIS — Z932 Ileostomy status: Secondary | ICD-10-CM

## 2010-06-24 DIAGNOSIS — E876 Hypokalemia: Secondary | ICD-10-CM | POA: Diagnosis present

## 2010-06-24 DIAGNOSIS — K5289 Other specified noninfective gastroenteritis and colitis: Secondary | ICD-10-CM | POA: Diagnosis present

## 2010-06-24 DIAGNOSIS — K560 Paralytic ileus: Principal | ICD-10-CM | POA: Diagnosis present

## 2010-06-24 DIAGNOSIS — N1330 Unspecified hydronephrosis: Secondary | ICD-10-CM | POA: Diagnosis present

## 2010-06-24 DIAGNOSIS — D630 Anemia in neoplastic disease: Secondary | ICD-10-CM | POA: Diagnosis present

## 2010-06-24 DIAGNOSIS — K56609 Unspecified intestinal obstruction, unspecified as to partial versus complete obstruction: Secondary | ICD-10-CM | POA: Diagnosis present

## 2010-06-24 HISTORY — DX: Malignant neoplasm of rectosigmoid junction: C19

## 2010-06-24 LAB — URINALYSIS, ROUTINE W REFLEX MICROSCOPIC
Glucose, UA: NEGATIVE mg/dL
Nitrite: NEGATIVE
Specific Gravity, Urine: 1.017 (ref 1.005–1.030)
pH: 6 (ref 5.0–8.0)

## 2010-06-24 LAB — URINE MICROSCOPIC-ADD ON

## 2010-06-24 LAB — COMPREHENSIVE METABOLIC PANEL
AST: 11 U/L (ref 0–37)
Albumin: 2.4 g/dL — ABNORMAL LOW (ref 3.5–5.2)
Alkaline Phosphatase: 69 U/L (ref 39–117)
BUN: 7 mg/dL (ref 6–23)
Creatinine, Ser: 0.64 mg/dL (ref 0.4–1.2)
GFR calc Af Amer: >60 mL/min (ref >60)
Potassium: 3.3 mEq/L — ABNORMAL LOW (ref 3.5–5.1)
Total Protein: 6.1 g/dL (ref 6.0–8.3)

## 2010-06-24 LAB — CBC
MCV: 87 fL (ref 78.0–100.0)
Platelets: 393 10*3/uL (ref 150–400)
RBC: 3.22 MIL/uL — ABNORMAL LOW (ref 3.87–5.11)
RDW: 16.4 % — ABNORMAL HIGH (ref 11.5–15.5)
WBC: 8 10*3/uL (ref 4.0–10.5)

## 2010-06-24 LAB — DIFFERENTIAL
Basophils Absolute: 0 10*3/uL (ref 0.0–0.1)
Basophils Relative: 0 % (ref 0–1)
Eosinophils Absolute: 0 10*3/uL (ref 0.0–0.7)
Eosinophils Relative: 0 % (ref 0–5)
Lymphs Abs: 0.7 10*3/uL (ref 0.7–4.0)
Neutrophils Relative %: 79 % — ABNORMAL HIGH (ref 43–77)

## 2010-06-24 LAB — PREGNANCY, URINE: Preg Test, Ur: NEGATIVE

## 2010-06-24 MED ORDER — IOHEXOL 300 MG/ML  SOLN
100.0000 mL | Freq: Once | INTRAMUSCULAR | Status: AC | PRN
  Administered 2010-06-24: 100 mL via INTRAVENOUS

## 2010-06-25 ENCOUNTER — Inpatient Hospital Stay (HOSPITAL_COMMUNITY): Payer: Medicaid Other

## 2010-06-25 DIAGNOSIS — C20 Malignant neoplasm of rectum: Secondary | ICD-10-CM

## 2010-06-25 LAB — CBC
HCT: 26.9 % — ABNORMAL LOW (ref 36.0–46.0)
MCH: 26.6 pg (ref 26.0–34.0)
MCV: 87.3 fL (ref 78.0–100.0)
RBC: 3.08 MIL/uL — ABNORMAL LOW (ref 3.87–5.11)
WBC: 7 10*3/uL (ref 4.0–10.5)

## 2010-06-25 LAB — BASIC METABOLIC PANEL
BUN: 5 mg/dL — ABNORMAL LOW (ref 6–23)
Chloride: 106 mEq/L (ref 96–112)
Creatinine, Ser: 0.71 mg/dL (ref 0.4–1.2)
Glucose, Bld: 85 mg/dL (ref 70–99)
Potassium: 3.9 mEq/L (ref 3.5–5.1)

## 2010-06-25 LAB — URINE CULTURE
Colony Count: NO GROWTH
Culture  Setup Time: 201203140113

## 2010-06-25 MED ORDER — GADOBENATE DIMEGLUMINE 529 MG/ML IV SOLN
15.0000 mL | Freq: Once | INTRAVENOUS | Status: AC | PRN
  Administered 2010-06-25: 15 mL via INTRAVENOUS

## 2010-06-25 NOTE — Consult Note (Signed)
Heather Mayer, Heather Mayer                 ACCOUNT NO.:  [id number]  MEDICAL RECORD NO.:  [id number]           PATIENT TYPE:  I  LOCATION:  1621                         FACILITY:  [facility]  PHYSICIAN:  Bertram Millard. Carleena Mires, M.D.DATE OF BIRTH:  [birth year]  DATE OF CONSULTATION:  06/24/2010 DATE OF DISCHARGE:                                CONSULTATION   REASON FOR CONSULTATION:  Right hydronephrosis.  BRIEF HISTORY:  This 40 year old female was admitted today for partial small-bowel obstruction.  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  She initially had a diverting ileostomy, followed by debulking surgery.  She is followed by Dr. [name], Surgical Oncologist at [facility], as well as [name] here.  The patient is status post chemotherapy and radiation. Her radiation was completed around August 2011.  At that time, stents were removed, as they were quite bothersome to the patient.  She has had several CT scans since that time, which she says one was performed in September or October and another one in February.  She does not have the current CT records, however.  Apparently, bilateral stents were placed. She thinks she still has a left-sided stent.  On recent CT scan, today which was performed for her abdominal symptoms, she was noted to have right hydronephrosis.  No left hydronephrosis was noted.  The ureter was seen dilated down to the pelvis, around some surgical clips which had been previously placed.  The patient has a normal creatinine of 0.64.  Her urinalysis is essentially clear.  Urologic consultation is requested for followup/management of the hydronephrosis.  PAST MEDICAL HISTORY:  Her medical history is significant for appendectomy with small-bowel resection, fibroma removal and diverting ileostomy in January of 2011.  In April, she at [facility] underwent her debulking procedure.  She is status post chemo and  radiation.  MEDICATIONS:  Include Zofran p.r.n. nausea, Percocet.  ALLERGIES:  She is allergic to COMPAZINE.  SOCIAL HISTORY:  The patient is not married.  She has a 9-year-old son, Heather Mayer, he is a fourth [employer] attorney.  She does not use tobacco.  FAMILY HISTORY:  Unremarkable.  REVIEW OF SYSTEMS:  The patient has had recent nausea and vomiting.  She also has some abdominal pain.  She denies any flank pain.  She denies any gross hematuria.  She has not had fever or chills.  Otherwise, her review of systems is negative.  PHYSICAL EXAMINATION:  GENERAL:  Exam revealed a pleasant, somewhat listless adult female. VITAL SIGNS:  Blood pressure 125/86, temperature 98.2, pulse was 115, respiratory rate 28. HEENT:  Normal.  Sclerae were anicteric.  Mucous membranes were moist. NECK:  Supple. HEART:  Increased rate, normal rhythm. LUNGS:  Clear. ABDOMEN:  Soft, tender but without specific rebound.  Slightly distended.  She had no CVA tenderness.  LABORATORY DATA:  Review of the patient's admission data is as per HPI.  IMPRESSION: 1. Right hydronephrosis.  I think that this is most likely chronic in     nature.  It seems like it may have been followed at [facility].  Overall, her renal function is normal.  She does not     have an infection or significant flank pain. 2. 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  PLAN: 1. At this point, I do not think there is an urgent need to place the     stent.  Her urine is clear and she is having no flank symptoms. 2. If Oncology feels this is necessary to place a stent in the near     future, I would be more than happy to oblige.  If she is going to     have a laparotomy either here or at [facility], that might be the     best time to place an elective stent. 3. I will be happy to reconsult this lady at your leisure.     Bertram Millard. Sanchez Hemmer, [name]     SMD/MEDQ  D:  06/24/2010   T:  06/25/2010  Job:  [id number]  cc:   [name], [name] Fax: [phone]  Electronically Signed by Marcine Matar [name] on 06/25/2010 10:35:48 AM

## 2010-06-26 LAB — COMPREHENSIVE METABOLIC PANEL
ALT: <6 U/L (ref 0–35)
AST: 19 U/L (ref 0–37)
Alkaline Phosphatase: 77 U/L (ref 39–117)
CO2: 24 mEq/L (ref 19–32)
Calcium: 10.2 mg/dL (ref 8.4–10.5)
GFR calc Af Amer: >60 mL/min (ref >60)
Glucose, Bld: 94 mg/dL (ref 70–99)
Potassium: 3.8 mEq/L (ref 3.5–5.1)
Sodium: 142 mEq/L (ref 135–145)
Total Protein: 7.6 g/dL (ref 6.0–8.3)

## 2010-06-26 LAB — DIFFERENTIAL
Basophils Absolute: 0 10*3/uL (ref 0.0–0.1)
Basophils Relative: 1 % (ref 0–1)
Eosinophils Absolute: 0 10*3/uL (ref 0.0–0.7)
Eosinophils Relative: 0 % (ref 0–5)
Eosinophils Relative: 1 % (ref 0–5)
Lymphs Abs: 1.6 10*3/uL (ref 0.7–4.0)
Monocytes Absolute: 3.6 10*3/uL — ABNORMAL HIGH (ref 0.1–1.0)
Monocytes Relative: 14 % — ABNORMAL HIGH (ref 3–12)
Neutrophils Relative %: 88 % — ABNORMAL HIGH (ref 43–77)
Neutrophils Relative %: 59 % (ref 43–77)

## 2010-06-26 LAB — URINALYSIS, ROUTINE W REFLEX MICROSCOPIC
Bilirubin Urine: NEGATIVE
Glucose, UA: NEGATIVE mg/dL
Glucose, UA: NEGATIVE mg/dL
Ketones, ur: NEGATIVE mg/dL
Ketones, ur: NEGATIVE mg/dL
Protein, ur: 100 mg/dL — AB
Protein, ur: 100 mg/dL — AB
Urobilinogen, UA: 0.2 mg/dL (ref 0.0–1.0)

## 2010-06-26 LAB — URINE MICROSCOPIC-ADD ON

## 2010-06-26 LAB — CBC
HCT: 35.3 % — ABNORMAL LOW (ref 36.0–46.0)
Hemoglobin: 13.4 g/dL (ref 12.0–15.0)
Hemoglobin: 11.9 g/dL — ABNORMAL LOW (ref 12.0–15.0)
MCHC: 34.2 g/dL (ref 30.0–36.0)
MCV: 86.8 fL (ref 78.0–100.0)
Platelets: 384 10*3/uL (ref 150–400)
RBC: 4.32 MIL/uL (ref 3.87–5.11)
RBC: 3.03 MIL/uL — ABNORMAL LOW (ref 3.87–5.11)
WBC: 5.2 10*3/uL (ref 4.0–10.5)
WBC: 6.3 10*3/uL (ref 4.0–10.5)

## 2010-06-26 LAB — BASIC METABOLIC PANEL
BUN: 1 mg/dL — ABNORMAL LOW (ref 6–23)
Calcium: 10.1 mg/dL (ref 8.4–10.5)
Chloride: 106 mEq/L (ref 96–112)
GFR calc Af Amer: >60 mL/min (ref >60)
GFR calc Af Amer: >60 mL/min (ref >60)
GFR calc non Af Amer: >60 mL/min (ref >60)
Potassium: 3.1 mEq/L — ABNORMAL LOW (ref 3.5–5.1)
Sodium: 138 mEq/L (ref 135–145)
Sodium: 140 mEq/L (ref 135–145)

## 2010-06-26 LAB — IRON AND TIBC
Iron: 34 ug/dL — ABNORMAL LOW (ref 42–135)
Saturation Ratios: 22 % (ref 20–55)
TIBC: 156 ug/dL — ABNORMAL LOW (ref 250–470)
UIBC: 122 ug/dL

## 2010-06-26 LAB — URINE CULTURE

## 2010-06-26 LAB — ABO/RH: ABO/RH(D): B POS

## 2010-06-26 LAB — VITAMIN B12: Vitamin B-12: 417 pg/mL (ref 211–911)

## 2010-06-27 LAB — COMPREHENSIVE METABOLIC PANEL WITH GFR
AST: 27 U/L (ref 0–37)
Albumin: 3.7 g/dL (ref 3.5–5.2)
Alkaline Phosphatase: 94 U/L (ref 39–117)
Chloride: 100 meq/L (ref 96–112)
Creatinine, Ser: 0.92 mg/dL (ref 0.4–1.2)
GFR calc Af Amer: >60 mL/min (ref >60)
Potassium: 3.8 meq/L (ref 3.5–5.1)
Total Bilirubin: 1.1 mg/dL (ref 0.3–1.2)

## 2010-06-27 LAB — COMPREHENSIVE METABOLIC PANEL
ALT: 34 U/L (ref 0–35)
Albumin: 3.5 g/dL (ref 3.5–5.2)
BUN: 3 mg/dL — ABNORMAL LOW (ref 6–23)
BUN: 5 mg/dL — ABNORMAL LOW (ref 6–23)
CO2: 26 mEq/L (ref 19–32)
CO2: 28 mEq/L (ref 19–32)
Calcium: 10 mg/dL (ref 8.4–10.5)
Calcium: 9.6 mg/dL (ref 8.4–10.5)
Chloride: 101 mEq/L (ref 96–112)
Creatinine, Ser: 0.7 mg/dL (ref 0.4–1.2)
GFR calc non Af Amer: >60 mL/min (ref >60)
GFR calc non Af Amer: >60 mL/min (ref >60)
Glucose, Bld: 101 mg/dL — ABNORMAL HIGH (ref 70–99)
Sodium: 141 mEq/L (ref 135–145)
Total Bilirubin: 0.9 mg/dL (ref 0.3–1.2)
Total Protein: 7.3 g/dL (ref 6.0–8.3)

## 2010-06-27 LAB — CULTURE, BLOOD (ROUTINE X 2)

## 2010-06-27 LAB — CBC
HCT: 37.2 % (ref 36.0–46.0)
HCT: 30.1 % — ABNORMAL LOW (ref 36.0–46.0)
HCT: 38.8 % (ref 36.0–46.0)
Hemoglobin: 12.4 g/dL (ref 12.0–15.0)
Hemoglobin: 10.1 g/dL — ABNORMAL LOW (ref 12.0–15.0)
Hemoglobin: 13.1 g/dL (ref 12.0–15.0)
MCH: 28.2 pg (ref 26.0–34.0)
MCH: 29.9 pg (ref 26.0–34.0)
MCH: 30.2 pg (ref 26.0–34.0)
MCH: 30.4 pg (ref 26.0–34.0)
MCHC: 33.3 g/dL (ref 30.0–36.0)
MCHC: 33.3 g/dL (ref 30.0–36.0)
MCHC: 33.6 g/dL (ref 30.0–36.0)
MCV: 84.5 fL (ref 78.0–100.0)
MCV: 88.8 fL (ref 78.0–100.0)
MCV: 89.5 fL (ref 78.0–100.0)
MCV: 89.6 fL (ref 78.0–100.0)
MCV: 89.7 fL (ref 78.0–100.0)
Platelets: 268 10*3/uL (ref 150–400)
Platelets: 273 10*3/uL (ref 150–400)
Platelets: 330 10*3/uL (ref 150–400)
Platelets: 377 K/uL (ref 150–400)
RBC: 3.03 MIL/uL — ABNORMAL LOW (ref 3.87–5.11)
RBC: 3.18 MIL/uL — ABNORMAL LOW (ref 3.87–5.11)
RBC: 4.32 MIL/uL (ref 3.87–5.11)
RBC: 4.75 MIL/uL (ref 3.87–5.11)
RDW: 17.5 % — ABNORMAL HIGH (ref 11.5–15.5)
RDW: 20.3 % — ABNORMAL HIGH (ref 11.5–15.5)
WBC: 2.9 10*3/uL — ABNORMAL LOW (ref 4.0–10.5)
WBC: 3.4 10*3/uL — ABNORMAL LOW (ref 4.0–10.5)
WBC: 3.9 10*3/uL — ABNORMAL LOW (ref 4.0–10.5)
WBC: 6.3 K/uL (ref 4.0–10.5)

## 2010-06-27 LAB — DIFFERENTIAL
Basophils Absolute: 0 K/uL (ref 0.0–0.1)
Basophils Absolute: 0.1 10*3/uL (ref 0.0–0.1)
Basophils Relative: 0 % (ref 0–1)
Eosinophils Absolute: 0.2 10*3/uL (ref 0.0–0.7)
Eosinophils Relative: 4 % (ref 0–5)
Lymphocytes Relative: 14 % (ref 12–46)
Lymphocytes Relative: 8 % — ABNORMAL LOW (ref 12–46)
Lymphs Abs: 0.4 10*3/uL — ABNORMAL LOW (ref 0.7–4.0)
Lymphs Abs: 0.9 10*3/uL (ref 0.7–4.0)
Monocytes Absolute: 0.9 K/uL (ref 0.1–1.0)
Monocytes Relative: 15 % — ABNORMAL HIGH (ref 3–12)
Neutro Abs: 3.9 10*3/uL (ref 1.7–7.7)
Neutro Abs: 4.2 10*3/uL (ref 1.7–7.7)
Neutrophils Relative %: 67 % (ref 43–77)

## 2010-06-27 LAB — BASIC METABOLIC PANEL
BUN: 1 mg/dL — ABNORMAL LOW (ref 6–23)
BUN: <1 mg/dL — ABNORMAL LOW (ref 6–23)
CO2: 27 mEq/L (ref 19–32)
CO2: 27 mEq/L (ref 19–32)
Calcium: 8.4 mg/dL (ref 8.4–10.5)
Calcium: 8.2 mg/dL — ABNORMAL LOW (ref 8.4–10.5)
Chloride: 101 mEq/L (ref 96–112)
Chloride: 104 mEq/L (ref 96–112)
Creatinine, Ser: 0.57 mg/dL (ref 0.4–1.2)
Creatinine, Ser: 0.72 mg/dL (ref 0.4–1.2)
GFR calc Af Amer: >60 mL/min (ref >60)
GFR calc Af Amer: >60 mL/min (ref >60)
GFR calc Af Amer: >60 mL/min (ref >60)
GFR calc non Af Amer: >60 mL/min (ref >60)
Glucose, Bld: 98 mg/dL (ref 70–99)
Glucose, Bld: 116 mg/dL — ABNORMAL HIGH (ref 70–99)
Potassium: 2.9 mEq/L — ABNORMAL LOW (ref 3.5–5.1)
Potassium: 3.3 mEq/L — ABNORMAL LOW (ref 3.5–5.1)
Sodium: 140 mEq/L (ref 135–145)
Sodium: 139 mEq/L (ref 135–145)

## 2010-06-27 LAB — URINE MICROSCOPIC-ADD ON

## 2010-06-27 LAB — URINALYSIS, ROUTINE W REFLEX MICROSCOPIC
Glucose, UA: NEGATIVE mg/dL
Ketones, ur: 15 mg/dL — AB
Nitrite: POSITIVE — AB
Protein, ur: >300 mg/dL — AB
Specific Gravity, Urine: 1.034 — ABNORMAL HIGH (ref 1.005–1.030)
Urobilinogen, UA: 1 mg/dL (ref 0.0–1.0)
pH: 6 (ref 5.0–8.0)

## 2010-06-27 LAB — CROSSMATCH: Unit division: 0

## 2010-06-27 LAB — URINE CULTURE
Colony Count: NO GROWTH
Culture  Setup Time: 201108061145
Culture: NO GROWTH

## 2010-06-27 LAB — LIPASE, BLOOD: Lipase: 22 U/L (ref 11–59)

## 2010-06-27 NOTE — Consult Note (Signed)
NAMEVANETA, Heather Mayer                 ACCOUNT NO.:  [id number]  MEDICAL RECORD NO.:  [id number]           PATIENT TYPE:  E  LOCATION:  WLED                         FACILITY:  [facility]  PHYSICIAN:  Khylah Kendra A. Damarea Merkel, M.D.DATE OF BIRTH:  02/17/1971  DATE OF CONSULTATION:  06/24/2010 DATE OF DISCHARGE:                                CONSULTATION   PRIMARY CARE PHYSICIAN:  Mainly Lauretta I. Odogwu, [name] who is her hematologist/oncologist.  PRIMARY SURGEON:  Herbie Saxon, [name] at [facility].  REASON FOR CONSULTATION:  Abdominal pain.  HISTORY OF PRESENT ILLNESS:  [name] is a very unfortunate but very pleasant 39 year old black female with a history of metastatic mucinous adenocarcinoma.  She was initially thought to have a chronic appendicitis.  However, upon excision she was found to have mucinous adenocarcinoma.  The patient was subsequently given an ileostomy and referred to Dr. [name] at [facility] where she underwent a major debulking surgery including a posterior pelvic exoneration, rectal resection, total abdominal hysterectomy, salpingo-oophorectomy, omentectomy and diverting loop ileostomy.  She has been doing fairly well from this since last year.  She last saw Dr. [name] on February 15 of this year. At that time she had a CT scan that revealed essentially stable changes. However, there was a small air-fluid collection along the superior margin of the rectal pouch at the suture line which opacified with rectal contrast.  However, the size of this was unchanged since the prior study, likely indicating a chronic contained leak.  There was also increasing right hydroureteronephrosis with narrowing of the distal ureter at the point of an ill-defined soft tissue which encompasses it as well as internal iliac artery branches.  This could potentially represent scar tissue, although very difficult to exclude residual recurrent carcinoma given the increasing impingement on the  ureter. 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  Last night the patient was sent home.  She noticed that she had a stomachache and some decrease in her ileostomy output.  She complained of a little bit of nausea, but no emesis.  She also complained of some low back pain and difficulty urinating for the last 1 to 2 weeks.  She also noticed some thin, watery, yellow to orange-type vaginal discharge where she is having to change a sanitary napkin approximately every 30 minutes.  The patient denies any sick contacts.  She presented here to [facility] where she had a CT scan, which essentially shows a lot of the same type of things as her prior CT scan with the exception of mild enteritis and possible low-grade bowel obstruction.  Because of these findings we were asked to evaluate the patient.  REVIEW OF SYSTEMS:  Please see HPI.  Otherwise all other systems have been reviewed and are negative.  PAST MEDICAL HISTORY:  Mucinous adenocarcinoma of metastatic disease.  PAST SURGICAL HISTORY: 1. Placement of Port-A-Cath. 2. Laparoscopic appendectomy with open small bowel resection, repair     of rectum, excision of fibroma of the uterus and ileostomy.  She     has also had major debulking surgery  with pelvic exoneration,     omentectomy, total abdominal hysterectomy with bilateral salpingo-     oophorectomy and diverting loop ileostomy along with the rectal     resection by Dr. [name] along with intraperitoneal hyperthermic     chemotherapy.  SOCIAL HISTORY:  The patient is single.  She does have one son who is present with her.  She is currently not able to work.  She denies any alcohol, tobacco or illicit drug abuse.  ALLERGIES:  COMPAZINE.  MEDICATIONS: 1. Zofran 4 mg p.o. p.r.n. 2. Percocet p.r.n. 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  PHYSICAL EXAMINATION:  GENERAL:  Ms.  Goertz is a pleasant 40 year old black female who is very skinny and otherwise in no acute distress. VITAL SIGNS:  Temperature 98.2, pulse between 74 and 114, blood pressure 112/68, respirations 28. HEENT:  Head is normocephalic, atraumatic.  Sclerae are noninjected. Pupils are equal, round and reactive to light.  Ears and nose without any obvious masses or lesions.  No rhinorrhea.  Mouth is pink.  Throat shows no exudate. HEART:  Regular rate and rhythm.  Normal S1, S2.  No murmurs, gallops or rubs are noted.  She does have palpable carotid, radial and pedal pulses bilaterally. LUNGS:  Clear to auscultation bilaterally.  There are no wheezes, rhonchi or rales noted.  Respiratory effort is nonlabored. ABDOMEN:  Soft and nondistended.  She is quite tender in the left lower quadrant, but no peritoneal signs are noted.  She has decreased hypoactive bowel sounds.  Her stoma is pink and viable.  Her ileostomy has currently no output.  No masses or hernias are noted.  She does have a midline scar present from her prior surgery. MUSCULOSKELETAL:  All four extremities are symmetrical with no cyanosis, clubbing or edema. PSYCHIATRIC:  The patient is alert and oriented x3 with an appropriate affect.  LABORATORY DATA:  White blood cell count 8000, hemoglobin 8.7, hematocrit 28.0, platelet count of 393,000.  Sodium 137, potassium 3.3, glucose 108, BUN 7, creatinine 0.64.  LFTs are all normal.  DIAGNOSTICS:  CT scan of the abdomen and pelvis reveals new mucinous adenocarcinoma with cystic 4-feet enhancing mass lesion again seen in the dependent portion of the pelvis, which is increased in size compared to the most recent examination concerning for progressive malignancy. There is also an air-fluid level seen in the mass for which secondary infection cannot be excluded.  There is also some dilated abnormal small bowel loops seen in the central portion of the abdomen with small bowel wall  thickening suggesting enteritis.  These loops are also dilated with decompressed loops of the bowel in the right lower quadrant adjacent to the ileostomy, suggesting underlying obstruction or adhesions.  She also has recurrence of a right-sided hydronephrosis to the level of the pelvic mass.  She also has unchanged hepatic masses.  When compared to her prior CT scan, a lot of these changes are stable.  IMPRESSION: 1. Metastatic mucinous adenocarcinoma status post debulking and     intraperitoneal hyperthermic chemotherapy with enteritis. 2. Right hydronephrosis. 3. Possible low-grade bowel obstruction. 4. Vaginal discharge.  PLAN:  We agree at this time with medical admission.  We would recommend bowel rest initially, as the patient has not had any emesis and I do not feel like requires an NG tube at this time.  If anything surgical were needed, the patient would likely require a transfer to [facility] where her primary  surgeon is.  I will discuss this patient with Dr. Luisa Hart and look at the CT scan to decide if this fluid collection on CT scan is appropriate for drainage percutaneously or not. The patient may also need a Urology consultation given her worsening of her right hydronephrosis.  We will follow along with you.     Letha Cape, PA   ______________________________ Clovis Pu Halli Equihua, [name]    KEO/MEDQ  D:  06/24/2010  T:  06/24/2010  Job:  [id number]  cc:   Bethann Berkshire, MD [address] [place], [place] [address]  [name], [name] Fax: [phone]  Herbie Saxon, [name]  Electronically Signed by Barnetta Chapel PA on 06/25/2010 01:45:21 PM Electronically Signed by Harriette Bouillon [name] on 06/27/2010 09:25:15 AM

## 2010-06-28 LAB — URINALYSIS, ROUTINE W REFLEX MICROSCOPIC
Bilirubin Urine: NEGATIVE
Bilirubin Urine: NEGATIVE
Glucose, UA: NEGATIVE mg/dL
Glucose, UA: NEGATIVE mg/dL
Protein, ur: 30 mg/dL — AB
Specific Gravity, Urine: 1.007 (ref 1.005–1.030)
Urobilinogen, UA: 0.2 mg/dL (ref 0.0–1.0)
pH: 6 (ref 5.0–8.0)

## 2010-06-28 LAB — CBC
HCT: 32.8 % — ABNORMAL LOW (ref 36.0–46.0)
Hemoglobin: 10.5 g/dL — ABNORMAL LOW (ref 12.0–15.0)
MCV: 85.9 fL (ref 78.0–100.0)
MCV: 88.7 fL (ref 78.0–100.0)
Platelets: 318 10*3/uL (ref 150–400)
Platelets: 339 10*3/uL (ref 150–400)
RBC: 3.82 MIL/uL — ABNORMAL LOW (ref 3.87–5.11)
RBC: 3.77 MIL/uL — ABNORMAL LOW (ref 3.87–5.11)
RBC: 4.88 MIL/uL (ref 3.87–5.11)
RDW: 18.6 % — ABNORMAL HIGH (ref 11.5–15.5)
WBC: 6.3 10*3/uL (ref 4.0–10.5)
WBC: 4.2 10*3/uL (ref 4.0–10.5)
WBC: 5.7 10*3/uL (ref 4.0–10.5)

## 2010-06-28 LAB — COMPREHENSIVE METABOLIC PANEL
ALT: 30 U/L (ref 0–35)
AST: 20 U/L (ref 0–37)
AST: 57 U/L — ABNORMAL HIGH (ref 0–37)
Albumin: 3.2 g/dL — ABNORMAL LOW (ref 3.5–5.2)
Albumin: 4.1 g/dL (ref 3.5–5.2)
Alkaline Phosphatase: 107 U/L (ref 39–117)
Alkaline Phosphatase: 93 U/L (ref 39–117)
Chloride: 100 mEq/L (ref 96–112)
Chloride: 102 mEq/L (ref 96–112)
Creatinine, Ser: 0.8 mg/dL (ref 0.4–1.2)
GFR calc Af Amer: >60 mL/min (ref >60)
GFR calc Af Amer: >60 mL/min (ref >60)
Potassium: 2.7 mEq/L — CL (ref 3.5–5.1)
Potassium: 3.3 mEq/L — ABNORMAL LOW (ref 3.5–5.1)
Sodium: 140 mEq/L (ref 135–145)
Total Bilirubin: 0.7 mg/dL (ref 0.3–1.2)
Total Bilirubin: 0.7 mg/dL (ref 0.3–1.2)
Total Protein: 8.2 g/dL (ref 6.0–8.3)

## 2010-06-28 LAB — URINE CULTURE

## 2010-06-28 LAB — DIFFERENTIAL
Basophils Absolute: 0 10*3/uL (ref 0.0–0.1)
Basophils Absolute: 0 10*3/uL (ref 0.0–0.1)
Basophils Relative: 0 % (ref 0–1)
Eosinophils Relative: 10 % — ABNORMAL HIGH (ref 0–5)
Eosinophils Relative: 7 % — ABNORMAL HIGH (ref 0–5)
Lymphocytes Relative: 7 % — ABNORMAL LOW (ref 12–46)
Monocytes Absolute: 0.5 10*3/uL (ref 0.1–1.0)
Monocytes Absolute: 0.8 10*3/uL (ref 0.1–1.0)
Monocytes Relative: 11 % (ref 3–12)
Monocytes Relative: 14 % — ABNORMAL HIGH (ref 3–12)

## 2010-06-28 LAB — URINE MICROSCOPIC-ADD ON

## 2010-06-29 LAB — URINE CULTURE
Colony Count: 30000
Colony Count: NO GROWTH
Culture: NO GROWTH

## 2010-06-29 LAB — URINALYSIS, ROUTINE W REFLEX MICROSCOPIC
Glucose, UA: NEGATIVE mg/dL
Ketones, ur: NEGATIVE mg/dL
Nitrite: NEGATIVE
Nitrite: POSITIVE — AB
Protein, ur: 100 mg/dL — AB
Protein, ur: >300 mg/dL — AB
Specific Gravity, Urine: 1.028 (ref 1.005–1.030)
Urobilinogen, UA: 0.2 mg/dL (ref 0.0–1.0)
Urobilinogen, UA: 1 mg/dL (ref 0.0–1.0)

## 2010-06-29 LAB — CBC
HCT: 33.3 % — ABNORMAL LOW (ref 36.0–46.0)
Hemoglobin: 10.4 g/dL — ABNORMAL LOW (ref 12.0–15.0)
Hemoglobin: 13.2 g/dL (ref 12.0–15.0)
MCHC: 31.2 g/dL (ref 30.0–36.0)
MCHC: 33.3 g/dL (ref 30.0–36.0)
RDW: 17 % — ABNORMAL HIGH (ref 11.5–15.5)
RDW: 17.4 % — ABNORMAL HIGH (ref 11.5–15.5)
WBC: 6.4 10*3/uL (ref 4.0–10.5)
WBC: 7.8 10*3/uL (ref 4.0–10.5)

## 2010-06-29 LAB — BASIC METABOLIC PANEL
BUN: 11 mg/dL (ref 6–23)
Calcium: 8.1 mg/dL — ABNORMAL LOW (ref 8.4–10.5)
Calcium: 10.5 mg/dL (ref 8.4–10.5)
GFR calc Af Amer: >60 mL/min (ref >60)
GFR calc non Af Amer: >60 mL/min (ref >60)
GFR calc non Af Amer: >60 mL/min (ref >60)
Glucose, Bld: 92 mg/dL (ref 70–99)
Glucose, Bld: 87 mg/dL (ref 70–99)
Potassium: 3 mEq/L — ABNORMAL LOW (ref 3.5–5.1)
Potassium: 4.4 mEq/L (ref 3.5–5.1)
Sodium: 138 mEq/L (ref 135–145)
Sodium: 139 mEq/L (ref 135–145)

## 2010-06-29 LAB — DIFFERENTIAL
Basophils Absolute: 0 10*3/uL (ref 0.0–0.1)
Basophils Relative: 0 % (ref 0–1)
Lymphocytes Relative: 19 % (ref 12–46)
Neutro Abs: 5.2 10*3/uL (ref 1.7–7.7)
Neutrophils Relative %: 67 % (ref 43–77)

## 2010-06-29 LAB — URINE MICROSCOPIC-ADD ON

## 2010-06-29 LAB — PREGNANCY, URINE: Preg Test, Ur: NEGATIVE

## 2010-06-29 LAB — MAGNESIUM: Magnesium: 1.4 mg/dL — ABNORMAL LOW (ref 1.5–2.5)

## 2010-06-30 LAB — COMPREHENSIVE METABOLIC PANEL
ALT: 18 U/L (ref 0–35)
AST: 17 U/L (ref 0–37)
Alkaline Phosphatase: 51 U/L (ref 39–117)
CO2: 28 mEq/L (ref 19–32)
Calcium: 9.7 mg/dL (ref 8.4–10.5)
Chloride: 105 mEq/L (ref 96–112)
GFR calc Af Amer: >60 mL/min (ref >60)
GFR calc non Af Amer: >60 mL/min (ref >60)
Potassium: 4.5 mEq/L (ref 3.5–5.1)
Sodium: 139 mEq/L (ref 135–145)
Total Bilirubin: 0.2 mg/dL — ABNORMAL LOW (ref 0.3–1.2)

## 2010-06-30 LAB — CBC
HCT: 35 % — ABNORMAL LOW (ref 36.0–46.0)
Hemoglobin: 14 g/dL (ref 12.0–15.0)
MCV: 86.6 fL (ref 78.0–100.0)
RBC: 4.04 MIL/uL (ref 3.87–5.11)
RBC: 4.86 MIL/uL (ref 3.87–5.11)
WBC: 15.5 10*3/uL — ABNORMAL HIGH (ref 4.0–10.5)
WBC: 6.9 10*3/uL (ref 4.0–10.5)

## 2010-06-30 LAB — DIFFERENTIAL
Basophils Relative: 0 % (ref 0–1)
Eosinophils Absolute: 0.1 10*3/uL (ref 0.0–0.7)
Eosinophils Relative: 1 % (ref 0–5)
Lymphs Abs: 1.7 10*3/uL (ref 0.7–4.0)

## 2010-06-30 LAB — URINE MICROSCOPIC-ADD ON

## 2010-06-30 LAB — BASIC METABOLIC PANEL
Chloride: 101 mEq/L (ref 96–112)
Creatinine, Ser: 0.89 mg/dL (ref 0.4–1.2)
GFR calc Af Amer: >60 mL/min (ref >60)
Potassium: 4.6 mEq/L (ref 3.5–5.1)

## 2010-06-30 LAB — CULTURE, ROUTINE-ABSCESS: Gram Stain: NONE SEEN

## 2010-06-30 LAB — PREGNANCY, URINE: Preg Test, Ur: NEGATIVE

## 2010-06-30 LAB — ANAEROBIC CULTURE

## 2010-06-30 LAB — URINALYSIS, ROUTINE W REFLEX MICROSCOPIC
Bilirubin Urine: NEGATIVE
Ketones, ur: NEGATIVE mg/dL
Nitrite: NEGATIVE
pH: 5.5 (ref 5.0–8.0)

## 2010-07-03 LAB — CBC
HCT: 34.8 % — ABNORMAL LOW (ref 36.0–46.0)
HCT: 38.5 % (ref 36.0–46.0)
MCHC: 33.6 g/dL (ref 30.0–36.0)
MCHC: 33.8 g/dL (ref 30.0–36.0)
MCHC: 34 g/dL (ref 30.0–36.0)
MCV: 86.9 fL (ref 78.0–100.0)
MCV: 87 fL (ref 78.0–100.0)
Platelets: 409 10*3/uL — ABNORMAL HIGH (ref 150–400)
Platelets: 421 10*3/uL — ABNORMAL HIGH (ref 150–400)
RBC: 4.01 MIL/uL (ref 3.87–5.11)
RBC: 4.42 MIL/uL (ref 3.87–5.11)
WBC: 12.8 10*3/uL — ABNORMAL HIGH (ref 4.0–10.5)

## 2010-07-03 LAB — BASIC METABOLIC PANEL
BUN: 4 mg/dL — ABNORMAL LOW (ref 6–23)
CO2: 26 mEq/L (ref 19–32)
Calcium: 8.9 mg/dL (ref 8.4–10.5)
Chloride: 100 mEq/L (ref 96–112)
Creatinine, Ser: 0.58 mg/dL (ref 0.4–1.2)
Creatinine, Ser: 1.09 mg/dL (ref 0.4–1.2)
GFR calc Af Amer: >60 mL/min (ref >60)
GFR calc Af Amer: >60 mL/min (ref >60)

## 2010-07-03 LAB — URINALYSIS, ROUTINE W REFLEX MICROSCOPIC
Bilirubin Urine: NEGATIVE
Glucose, UA: NEGATIVE mg/dL
Ketones, ur: NEGATIVE mg/dL
Nitrite: NEGATIVE
pH: 6.5 (ref 5.0–8.0)

## 2010-07-03 LAB — URINE CULTURE
Colony Count: NO GROWTH
Special Requests: NEGATIVE

## 2010-07-03 LAB — URINE MICROSCOPIC-ADD ON

## 2010-07-03 LAB — CEA: CEA: 11.2 ng/mL — ABNORMAL HIGH (ref 0.0–5.0)

## 2010-07-06 ENCOUNTER — Emergency Department (HOSPITAL_COMMUNITY)
Admission: EM | Admit: 2010-07-06 | Discharge: 2010-07-07 | Disposition: A | Discharge status: Home / Self Care | Payer: Medicaid Other | Attending: Emergency Medicine | Admitting: Emergency Medicine

## 2010-07-06 DIAGNOSIS — K6289 Other specified diseases of anus and rectum: Secondary | ICD-10-CM | POA: Insufficient documentation

## 2010-07-06 DIAGNOSIS — Z85048 Personal history of other malignant neoplasm of rectum, rectosigmoid junction, and anus: Secondary | ICD-10-CM | POA: Insufficient documentation

## 2010-07-07 LAB — COMPREHENSIVE METABOLIC PANEL WITH GFR
ALT: 8 U/L (ref 0–35)
AST: 12 U/L (ref 0–37)
Albumin: 2.5 g/dL — ABNORMAL LOW (ref 3.5–5.2)
Alkaline Phosphatase: 72 U/L (ref 39–117)
BUN: 5 mg/dL — ABNORMAL LOW (ref 6–23)
CO2: 27 meq/L (ref 19–32)
Calcium: 9 mg/dL (ref 8.4–10.5)
Chloride: 101 meq/L (ref 96–112)
Creatinine, Ser: 0.6 mg/dL (ref 0.4–1.2)
GFR calc non Af Amer: >60 mL/min
Glucose, Bld: 92 mg/dL (ref 70–99)
Potassium: 3.1 meq/L — ABNORMAL LOW (ref 3.5–5.1)
Sodium: 137 meq/L (ref 135–145)
Total Bilirubin: 0.4 mg/dL (ref 0.3–1.2)
Total Protein: 6.3 g/dL (ref 6.0–8.3)

## 2010-07-07 LAB — CBC
Hemoglobin: 11.2 g/dL — ABNORMAL LOW (ref 12.0–15.0)
MCH: 26.4 pg (ref 26.0–34.0)
Platelets: 459 10*3/uL — ABNORMAL HIGH (ref 150–400)
RBC: 4.24 MIL/uL (ref 3.87–5.11)
WBC: 5 10*3/uL (ref 4.0–10.5)

## 2010-07-07 LAB — DIFFERENTIAL
Basophils Relative: 0 % (ref 0–1)
Lymphs Abs: 1 10*3/uL (ref 0.7–4.0)
Monocytes Relative: 23 % — ABNORMAL HIGH (ref 3–12)
Neutro Abs: 2.8 10*3/uL (ref 1.7–7.7)
Neutrophils Relative %: 56 % (ref 43–77)

## 2010-07-08 NOTE — Discharge Summary (Signed)
Heather Mayer, Heather Mayer                 ACCOUNT NO.:  [id number]  MEDICAL RECORD NO.:  [id number]           PATIENT TYPE:  I  LOCATION:  1621                         FACILITY:  [facility]  PHYSICIAN:  Erick Blinks, MD     DATE OF BIRTH:  01-Oct-1970  DATE OF ADMISSION:  06/24/2010 DATE OF DISCHARGE:  06/29/2010                              DISCHARGE SUMMARY   PRIMARY CARE PHYSICIAN:  [name]. [name], [name]  GENERAL SURGEON:  Dr. [name] at [facility].  ONCOLOGIST:  [name], [name]  DISCHARGE DIAGNOSES: 1. Ileus versus partial small bowel obstruction, resolved. 2. Abdominal pain secondary to ileus versus partial small bowel     obstruction, improving. 3. Enteritis. 4. Progressive mucinous adenocarcinoma of the rectum, status post     debulking, chemo and radiation to pelvis. 5. Right-sided hydronephrosis. 6. Anemia of chronic disease. 7. Hypokalemia. 8. Hypomagnesemia.  DISCHARGE MEDICATIONS: 1. Bentyl 20 mg p.o. q.6 h. 2. Oxycodone/APAP 5/325 mg 2 tablets p.o. q.4 h. p.r.n. 3. Zofran 8 mg p.o. q.6 h. p.r.n. 4. Potassium chloride 40 mEq by mouth b.i.d. for 7 days. 5. Magnesium oxide 400 mg 1 tablet by mouth b.i.d. for 7 days.  ADMISSION HISTORY:  This is a 40 year old female with a history of rectal cancer, status post debulking surgery and who currently has ileostomy.  The patient received radiation and chemotherapy for this. She comes to the emergency room with complaints of abdominal pain, which she states it is diffuse with worsening in the left lower part of the abdomen.  She had noticed decreased output from her ostomy.  The patient had a CT scan of the abdomen done, which showed a new mucinous adenocarcinoma, but according to her oncologist [name], there was no significant change from the CT scan she had done on the 15th at [facility] and was also noted to have air-fluid levels in the mass and the patient was subsequently was admitted for further  evaluation.  For further details, please refer to the history and physical dictated by [name] on March 13th.  HOSPITAL COURSE: 1. Ileus versus partial small bowel obstruction.  The patient was seen     in consultation from [facility].  She was placed     initially on bowel rest and then started on clear liquids with     conservative management.  Her partial small bowel obstruction     resolved.  She is having a fair amount of output from her ostomy     now and does not have any nausea or vomiting and her abdominal pain     is also improved. 2. Enteritis.  The patient was placed empirically on Unasyn.  She has     completed her antibiotics here in the hospital, is afebrile, and     has a normal WBC count. 3. Progressive adenocarcinoma.  The patient has an MRI of the abdomen     done here in the hospital, which showed peripherally enhancing     fluid gas collection within the posterior pelvis.  This shows     communication with  rectum as well as could not exclude fistulas     communication to the vagina.  This was discussed with surgical     service and it was recommended that the patient follow up with her     primary surgeon at [facility], there did not feel to be any emergent     need to have the patient transfer to [facility]. 4. Right-sided hydroureteronephrosis.  The patient was seen in     consultation by Dr. [name] from Urology and no emergent need for     stent placement was felt to be necessary.  It was recommended that     the patient will follow up with Dr. [name] on elective basis as     an outpatient for possible stent placement.  She may also follow up     at [facility] where she has had a stent placed in the past by     urologist over there.  It will be best if the stent could be placed     during another procedure where she is already under general     anesthesia, we will give her the contact information for Dr.     [name] as well. 5. Abdominal pain.   The patient was initially placed on a PCA.  This     has been transitioned to oral Percocet as well as Bentyl. 6. Anemia of chronic disease.  The patient was transfused a total of 2     units of packed red cells.  Her hemoglobin essentially remained     stable. 7. Electrolyte abnormalities.  The patient is significantly     hypokalemic as well as hypomagnesemic.  It has been replaced during     the hospital and she will be discharged on oral supplementation. 8. Heme-positive stools.  This was felt to be secondary to oozing from     the raw surface of her sigmoid colon.  Her hemoglobin has since     been stable.  She will follow up with her surgeon.  CONSULTATIONS: 1. Oncology, Lauretta I. Odogwu, [name] 2. [facility] Surgery. 3. Bertram Millard. Dahlstedt, [name], Urology.  PROCEDURES:  None.  DIAGNOSTIC IMAGING: 1. Acute abdominal series on March 13th shows no acute cardiopulmonary     disease, air-fluid levels seen in the midportion of the abdomen and     nondilated small bowel, which could be reflective with developing     obstruction or ileus. 2. CT of the abdomen and pelvis on March 13th shows new mucinous     adenocarcinoma with cystic 4 feet enhancing mass lesion.  Again,     seen in the dependent portion of pelvis, increased in size compared     to most recent exam concerning for progressive malignancy.  There     is an air-fluid level seen in the mass, for which secondary     infection cannot be excluded, dilated abnormal small bowel loops     seen in the central portion of the abdomen with small bowel     thickening, suggesting enteritis.  These loops were also dilated     with decompressed loops of bowel in the right lower quadrant     adjacent to the ileoileostomy, suggesting underlying     obstruction/adhesions, recurrence of right-sided hydronephrosis to     the level of the pelvic mass. 3. Acute abdominal series on March 14th, slightly prominent small     bowel loops  in the midabdomen, suggesting small bowel obstruction  because minimally progressing to prior exam. 4. MRI of the abdomen on March 15th shows findings of enteritis and     adynamic ileus versus partial small bowel obstruction, bladder     finding is better evaluated on yesterday's CT, new or increased     small volume abdominal ascites, numerous liver lesions similar to     February 21st, favor to be hemangioma, mild technique degraded     exam, moderate right-sided hydroureteronephrosis. 5. MRI of the pelvis shows peripherally enhancing fluid gas collection     within the posterior pelvis, which shows communication with the     rectum, presumably secondary to breakdown of rectal stump sutures,     cannot exclude fistulous communication to vagina of findings could     all be infectious for residual necrotic tumor cannot be excluded.     This report will be called.  Apparent soft tissue nodularity within     the right lower extremity.  This could be partially due to     prominent veins.  Recommended attention on followup to exclude an     atypical appearance of notable metastases, moderate right-sided     hydronephrosis,  presumably secondary to the pelvic fluid     collection.  DISCHARGE INSTRUCTIONS:  The patient should follow up with [name] as previously scheduled on 20th of this month.  She will need to see her primary care physician within the next 1 week.  She was also recommended to follow up with her surgeon at [facility] in the next 1-2 weeks as well as the Dr. [name] in the next 2-3 weeks.  Should continue on a low- fiber diet, conduct her activities tolerated.  Plan was discussed with the patient who is in agreement.  Condition at the time of discharge is improved.  Time spent on this discharge is 45 minutes.     Erick Blinks, MD     JM/MEDQ  D:  06/29/2010  T:  06/30/2010  Job:  [id number]  cc:   Juanita Laster, MD  [name], [name] Fax:  [phone]  Dr. [name] [facility]  Electronically Signed by [facility]  on 07/08/2010 06:41:03 PM

## 2010-07-09 ENCOUNTER — Other Ambulatory Visit: Payer: Self-pay | Admitting: Hematology and Oncology

## 2010-07-09 ENCOUNTER — Encounter (HOSPITAL_BASED_OUTPATIENT_CLINIC_OR_DEPARTMENT_OTHER): Payer: Self-pay | Admitting: Hematology and Oncology

## 2010-07-09 DIAGNOSIS — Z5189 Encounter for other specified aftercare: Secondary | ICD-10-CM

## 2010-07-09 DIAGNOSIS — Z5111 Encounter for antineoplastic chemotherapy: Secondary | ICD-10-CM

## 2010-07-09 DIAGNOSIS — C20 Malignant neoplasm of rectum: Secondary | ICD-10-CM

## 2010-07-09 LAB — CBC WITH DIFFERENTIAL/PLATELET
BASO%: 0.2 % (ref 0.0–2.0)
Basophils Absolute: 0 10*3/uL (ref 0.0–0.1)
EOS%: 0.5 % (ref 0.0–7.0)
HGB: 12.6 g/dL (ref 11.6–15.9)
MCH: 27.5 pg (ref 25.1–34.0)
MCHC: 32.4 g/dL (ref 31.5–36.0)
MCV: 84.7 fL (ref 79.5–101.0)
MONO%: 12.3 % (ref 0.0–14.0)
RBC: 4.59 10*6/uL (ref 3.70–5.45)
RDW: 17.1 % — ABNORMAL HIGH (ref 11.2–14.5)
lymph#: 0.9 10*3/uL (ref 0.9–3.3)

## 2010-07-09 LAB — COMPREHENSIVE METABOLIC PANEL
ALT: <8 U/L (ref 0–35)
AST: 11 U/L (ref 0–37)
Albumin: 3.5 g/dL (ref 3.5–5.2)
Alkaline Phosphatase: 94 U/L (ref 39–117)
BUN: 6 mg/dL (ref 6–23)
Calcium: 9.4 mg/dL (ref 8.4–10.5)
Chloride: 100 mEq/L (ref 96–112)
Potassium: 3.4 mEq/L — ABNORMAL LOW (ref 3.5–5.3)
Sodium: 140 mEq/L (ref 135–145)

## 2010-07-15 LAB — CULTURE, ROUTINE-ABSCESS

## 2010-07-15 LAB — CBC
MCHC: 34.1 g/dL (ref 30.0–36.0)
RBC: 4.06 MIL/uL (ref 3.87–5.11)
RDW: 14.2 % (ref 11.5–15.5)

## 2010-07-15 LAB — APTT: aPTT: 35 seconds (ref 24–37)

## 2010-07-16 LAB — DIFFERENTIAL
Basophils Absolute: 0 10*3/uL (ref 0.0–0.1)
Basophils Relative: 0 % (ref 0–1)
Basophils Relative: 1 % (ref 0–1)
Eosinophils Absolute: 0.1 10*3/uL (ref 0.0–0.7)
Eosinophils Relative: 1 % (ref 0–5)
Lymphocytes Relative: 26 % (ref 12–46)
Lymphocytes Relative: 27 % (ref 12–46)
Lymphs Abs: 1.6 10*3/uL (ref 0.7–4.0)
Lymphs Abs: 2.9 10*3/uL (ref 0.7–4.0)
Monocytes Absolute: 0.7 10*3/uL (ref 0.1–1.0)
Monocytes Relative: 10 % (ref 3–12)
Monocytes Relative: 12 % (ref 3–12)
Neutro Abs: 3.8 10*3/uL (ref 1.7–7.7)
Neutro Abs: 6.6 10*3/uL (ref 1.7–7.7)
Neutrophils Relative %: 61 % (ref 43–77)
Neutrophils Relative %: 62 % (ref 43–77)

## 2010-07-16 LAB — URINE MICROSCOPIC-ADD ON

## 2010-07-16 LAB — COMPREHENSIVE METABOLIC PANEL
Albumin: 2.9 g/dL — ABNORMAL LOW (ref 3.5–5.2)
Alkaline Phosphatase: 78 U/L (ref 39–117)
BUN: 12 mg/dL (ref 6–23)
BUN: 7 mg/dL (ref 6–23)
Calcium: 9.5 mg/dL (ref 8.4–10.5)
Creatinine, Ser: 0.75 mg/dL (ref 0.4–1.2)
Creatinine, Ser: 0.8 mg/dL (ref 0.4–1.2)
Glucose, Bld: 110 mg/dL — ABNORMAL HIGH (ref 70–99)
Glucose, Bld: 95 mg/dL (ref 70–99)
Total Protein: 6.1 g/dL (ref 6.0–8.3)
Total Protein: 8.1 g/dL (ref 6.0–8.3)

## 2010-07-16 LAB — CBC
HCT: 34.6 % — ABNORMAL LOW (ref 36.0–46.0)
HCT: 37.8 % — ABNORMAL LOW (ref 36.0–46.0)
Hemoglobin: 11.4 g/dL — ABNORMAL LOW (ref 12.0–15.0)
Hemoglobin: 11.6 g/dL — ABNORMAL LOW (ref 12.0–15.0)
Hemoglobin: 12.7 g/dL — ABNORMAL LOW (ref 12.0–15.0)
MCHC: 33.5 g/dL (ref 30.0–36.0)
MCHC: 33.6 g/dL (ref 30.0–36.0)
MCHC: 34.2 g/dL (ref 30.0–36.0)
MCV: 86.7 fL (ref 78.0–100.0)
RBC: 4 MIL/uL (ref 3.87–5.11)
RDW: 13.8 % (ref 11.5–15.5)
RDW: 14.3 % (ref 11.5–15.5)
RDW: 14.6 % (ref 11.5–15.5)

## 2010-07-16 LAB — BASIC METABOLIC PANEL
BUN: <1 mg/dL — ABNORMAL LOW (ref 6–23)
CO2: 27 mEq/L (ref 19–32)
Calcium: 8.8 mg/dL (ref 8.4–10.5)
Chloride: 108 mEq/L (ref 96–112)
Creatinine, Ser: 0.75 mg/dL (ref 0.4–1.2)
GFR calc Af Amer: >60 mL/min (ref >60)
GFR calc non Af Amer: >60 mL/min (ref >60)
Glucose, Bld: 89 mg/dL (ref 70–99)
Potassium: 3.4 mEq/L — ABNORMAL LOW (ref 3.5–5.1)
Sodium: 140 mEq/L (ref 135–145)

## 2010-07-16 LAB — URINALYSIS, ROUTINE W REFLEX MICROSCOPIC
Ketones, ur: NEGATIVE mg/dL
Nitrite: NEGATIVE
Protein, ur: NEGATIVE mg/dL

## 2010-07-16 LAB — WET PREP, GENITAL
Trich, Wet Prep: NONE SEEN
Yeast Wet Prep HPF POC: NONE SEEN

## 2010-07-16 LAB — GC/CHLAMYDIA PROBE AMP, GENITAL: GC Probe Amp, Genital: NEGATIVE

## 2010-07-16 LAB — PREGNANCY, URINE: Preg Test, Ur: NEGATIVE

## 2010-07-20 NOTE — H&P (Signed)
Heather Mayer, SHORE                 ACCOUNT NO.:  [id number]  MEDICAL RECORD NO.:  [id number]           PATIENT TYPE:  E  LOCATION:  WLED                         FACILITY:  [facility]  PHYSICIAN:  Calvert Cantor, [name]     DATE OF BIRTH:  [birth year]  DATE OF ADMISSION:  06/24/2010 DATE OF DISCHARGE:                             HISTORY & PHYSICAL   PRIMARY CARE PHYSICIAN:  [name]. [name], MD but she has not seen him in quite a while.  She essentially sees [name] and her oncologist at [facility].  The patient has a surgical oncologist by the name of Dr. [name] at [facility].  PRESENTING COMPLAINT:  Abdominal pain.  HISTORY OF PRESENT ILLNESS:  This is a 40 year old female with rectal cancer discovered in April 2011.  The patient had debulking surgery for this and has in the ileostomy.  The patient has received radiation and chemotherapy for this.  Last radiation was in August 2011.  Last chemo was on May 14, 2010.  She states that she is in remission.  The patient comes in with a complaint of abdominal pain which she states is diffuse but worse in the left lower part of her abdomen.  This started last night around 9-9:30 p.m.  She also noticed that she has had decreased output from her ostomy since then.  She did eat last night but has not eaten anything today.  She has had nausea but has not had any vomiting.  Pain is quite severe and she has required numerous doses of Dilaudid and fentanyl and is still in quite a bit of pain.  The patient also states that she was seen at [facility] and diagnosed with a UTI.  According to the lab work in the computer this was on March 8.  She was placed on an antibiotic but has not had the opportunity to pick this up yet and therefore has not started it.  She is found to have a urinary tract infection.  She also has a right-sided hydronephrosis, which was present on a previous CT done at [facility] on February 15.  When asked about  having a urologist, she admits to having a stent placed by a urologist in [place].  She actually had bilateral stents in her ureters but the right ureteric stent was removed as it was being irritated by her radiation.  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  The ER PA actually discussed these CT findings with [name] who has reviewed a CT that was performed at [facility] on February 15.  According to [name], there is no significant change from the 15th and she does not believe this is a recurrence of the cancer but rather scar tissue.  In addition, there are air-fluid levels noted in the mass and I have requested surgery to further evaluate this.  The patient may need a PET scan to completely exclude recurrent malignancy.  PAST MEDICAL HISTORY: 1. The patient initially had appendectomy with small bowel resection,  excision of a fibroma of the uterus, and ileostomy in January 2011.     Subsequently in April, the patient had a major debulking surgery     for what was then diagnosed to be a rectal cancer.  She started     chemo and radiation soon afterwards.  The patient also states that she has had stents in bilateral ureters with removal of stent of the right ureter.  She has had a total abdominal hysterectomy.  ALLERGIES:  COMPAZINE causes numbness of her mouth.  HOME MEDICATIONS:  She is on Zofran 8 mg as needed and oxycodone/acetaminophen 7.5/325 as needed.  FAMILY HISTORY:  Mother is healthy.  Father's history is unknown.  SOCIAL HISTORY:  The patient is currently on disability.  She used to be a Engineer, technical sales.  She has a 72-year-old son whom she takes care of. Does not drink or smoke.  Lives alone with her son.  REVIEW OF SYSTEMS:  No recent weight loss or weight gain.  No fever, chills, or sweats.  No significant fatigue.  HEENT:  No frequent headaches.  No blurred vision, double vision.   No sore throat, sinus trouble, earache.  RESPIRATORY:  No cough or shortness of breath. CARDIAC:  No chest pain, palpitations, or pedal edema.  GI:  Positive for nausea and decreased output through her ileostomy and abdominal pain.  GU:  No dysuria, hematuria.  HEMATOLOGIC:  Bruises easily.  SKIN: No rash.  MUSCULOSKELETAL:  No joint pain or back pain.  NEUROLOGIC:  No focal numbness, weakness, tingling.  No history of strokes or seizures. PSYCHOLOGIC:  No anxiety or depression.  PHYSICAL EXAMINATION:  VITAL SIGNS:  Blood pressure 125/86, pulse 115, respiratory rate 28, temperature 98.2, oxygen saturation 98% on room air. HEENT:  Pupils equal, round, reactive to light.  Extraocular movements are intact.  Conjunctivae are pink.  No scleral icterus.  Oral mucosa is moist.  Oropharynx is clear. NECK:  Supple.  No thyromegaly, lymphadenopathy, carotid bruits. HEART:  Regular rate and rhythm.  No murmurs, rubs or gallops. LUNGS:  Clear bilaterally.  Normal respiratory effort.  No use of accessory muscles. ABDOMEN:  Soft.  It is diffusely tender.  Maximum point of tenderness is in the left lower quadrant.  It is actually quite tender to the touch. Bowel sounds are not present.  There is no distention noted. EXTREMITIES:  No cyanosis, clubbing or edema.  Pedal pulses positive. NEUROLOGIC:  Cranial nerves II-XII are intact.  Strength intact in all 4 extremities. PSYCHOLOGIC:  Awake, alert, oriented x3, is in discomfort from pain. SKIN:  Warm, dry.  No rash or bruising.  BLOOD WORK:  Hemoglobin is low at 8.7, hematocrit 28.  Rest of her CBC is normal.  Neutrophil count is barely elevated at 79.  Metabolic panel reveals a potassium of 3.3.  The rest her metabolic panel is normal. Lipase is 18.  UA reveals large leukocyte esterase, 7-10 wbc's, rare bacteria.  CT of the abdomen and pelvis is read as: 1. A new mucinous adenocarcinoma with cystic 4-feet enhancing mass     lesions seen in the  dependent portion of the pelvis.  Increase in     size compared to most recent exam concerning for progressive     malignancy.  There is air-fluid levels in the mass for which     secondary infection cannot be excluded. 2. Dilated abnormal small bowel loops seen in the central portion of     the abdomen with small  bowel wall thickening suggesting enteritis.     These loops are also dilated with decompressed loops of bowel in     the right lower quadrant adjacent to the ileostomy suggest     underlying obstruction/adhesions. 3. Recurrence a right-sided hydronephrosis to the level of the pelvic     mass. 4. Unchanged hepatic masses.  Chest x-ray does not show any acute findings.  Port-A-Cath is in place with tip in the upper SVC.  ASSESSMENT AND PLAN: 1. Abdominal pain.  I suspect this may be due to an obstruction,     although the CT also questions enteritis.  At this point, I will     start her on Unasyn.  I have requested a surgery eval for possible     obstruction and also for the gas and fluid collection in the     pelvis.  As mentioned above, the patient may need a PET scan to     completely rule out recurrence of malignancy.  At this point, she     is in quite a lot of pain and I will have to start a PCA pump. 2. Urinary tract infection with right-sided hydronephrosis up to the     level of the mass.  I requested a urology consult.  She will be     seen by Dr. [name].  We will send a urine culture and keep her     on Unasyn for now. 3. History of rectal cancer. 4. Nausea.  We will control this with Zofran.  The patient is a full code.  DVT prophylaxis with Lovenox.  She will be n.p.o. except for ice chips.  I will give her normal saline at 100 mL an hour.  Time on admission was 65 minutes.     Calvert Cantor, [name]     SR/MEDQ  D:  06/24/2010  T:  06/24/2010  Job:  [id number]  cc:   [name], [name] Fax: [phone]  Juanita Laster, MD  Electronically Signed  by Calvert Cantor [name] on 07/20/2010 03:47:04 PM

## 2010-09-19 ENCOUNTER — Emergency Department (HOSPITAL_COMMUNITY)
Admission: EM | Admit: 2010-09-19 | Discharge: 2010-09-19 | Disposition: A | Discharge status: Home / Self Care | Payer: Medicaid Other | Attending: Emergency Medicine | Admitting: Emergency Medicine

## 2010-09-19 DIAGNOSIS — Z9889 Other specified postprocedural states: Secondary | ICD-10-CM | POA: Insufficient documentation

## 2010-09-19 DIAGNOSIS — R109 Unspecified abdominal pain: Secondary | ICD-10-CM | POA: Insufficient documentation

## 2010-09-19 DIAGNOSIS — R110 Nausea: Secondary | ICD-10-CM | POA: Insufficient documentation

## 2010-09-19 DIAGNOSIS — Z85048 Personal history of other malignant neoplasm of rectum, rectosigmoid junction, and anus: Secondary | ICD-10-CM | POA: Insufficient documentation

## 2010-09-19 DIAGNOSIS — R10819 Abdominal tenderness, unspecified site: Secondary | ICD-10-CM | POA: Insufficient documentation

## 2010-09-19 LAB — CBC
HCT: 29 % — ABNORMAL LOW (ref 36.0–46.0)
MCH: 25.3 pg — ABNORMAL LOW (ref 26.0–34.0)
MCHC: 30.7 g/dL (ref 30.0–36.0)
MCV: 82.4 fL (ref 78.0–100.0)
Platelets: 328 10*3/uL (ref 150–400)
RDW: 15.8 % — ABNORMAL HIGH (ref 11.5–15.5)

## 2010-09-19 LAB — DIFFERENTIAL
Eosinophils Absolute: 0.2 10*3/uL (ref 0.0–0.7)
Eosinophils Relative: 3 % (ref 0–5)
Lymphocytes Relative: 12 % (ref 12–46)
Lymphs Abs: 0.7 10*3/uL (ref 0.7–4.0)
Monocytes Absolute: 0.8 10*3/uL (ref 0.1–1.0)

## 2010-09-19 LAB — COMPREHENSIVE METABOLIC PANEL
AST: 34 U/L (ref 0–37)
Albumin: 2.2 g/dL — ABNORMAL LOW (ref 3.5–5.2)
BUN: 13 mg/dL (ref 6–23)
Calcium: 9.1 mg/dL (ref 8.4–10.5)
Creatinine, Ser: <0.47 mg/dL (ref 0.4–1.2)
Total Bilirubin: 0.5 mg/dL (ref 0.3–1.2)
Total Protein: 7.4 g/dL (ref 6.0–8.3)

## 2010-09-19 LAB — URINALYSIS, ROUTINE W REFLEX MICROSCOPIC
Glucose, UA: NEGATIVE mg/dL
Protein, ur: NEGATIVE mg/dL
Specific Gravity, Urine: 1.013 (ref 1.005–1.030)
pH: 7.5 (ref 5.0–8.0)

## 2010-09-19 LAB — URINE MICROSCOPIC-ADD ON

## 2010-09-21 ENCOUNTER — Emergency Department (HOSPITAL_BASED_OUTPATIENT_CLINIC_OR_DEPARTMENT_OTHER)
Admission: EM | Admit: 2010-09-21 | Discharge: 2010-09-21 | Disposition: A | Discharge status: Home / Self Care | Payer: Medicaid Other | Attending: Emergency Medicine | Admitting: Emergency Medicine

## 2010-09-21 DIAGNOSIS — R109 Unspecified abdominal pain: Secondary | ICD-10-CM | POA: Insufficient documentation

## 2010-09-21 LAB — DIFFERENTIAL
Basophils Relative: 0 % (ref 0–1)
Eosinophils Absolute: 0.3 10*3/uL (ref 0.0–0.7)
Eosinophils Relative: 5 % (ref 0–5)
Lymphs Abs: 0.8 10*3/uL (ref 0.7–4.0)
Monocytes Relative: 14 % — ABNORMAL HIGH (ref 3–12)

## 2010-09-21 LAB — CBC
MCH: 25.5 pg — ABNORMAL LOW (ref 26.0–34.0)
MCV: 82.9 fL (ref 78.0–100.0)
Platelets: 402 10*3/uL — ABNORMAL HIGH (ref 150–400)
RDW: 15.4 % (ref 11.5–15.5)
WBC: 4.8 10*3/uL (ref 4.0–10.5)

## 2010-09-21 LAB — BASIC METABOLIC PANEL
Calcium: 9.8 mg/dL (ref 8.4–10.5)
Creatinine, Ser: <0.47 mg/dL (ref 0.4–1.2)
Sodium: 136 mEq/L (ref 135–145)

## 2010-09-25 ENCOUNTER — Emergency Department (HOSPITAL_COMMUNITY): Payer: Medicaid Other

## 2010-09-25 ENCOUNTER — Inpatient Hospital Stay (HOSPITAL_COMMUNITY)
Admission: EM | Admit: 2010-09-25 | Discharge: 2010-09-26 | DRG: 641 | Disposition: A | Discharge status: Home / Self Care | Payer: Medicaid Other | Attending: Family Medicine | Admitting: Family Medicine

## 2010-09-25 ENCOUNTER — Encounter (HOSPITAL_COMMUNITY): Payer: Self-pay | Admitting: Radiology

## 2010-09-25 DIAGNOSIS — E860 Dehydration: Principal | ICD-10-CM | POA: Diagnosis present

## 2010-09-25 DIAGNOSIS — T82598A Other mechanical complication of other cardiac and vascular devices and implants, initial encounter: Secondary | ICD-10-CM | POA: Diagnosis present

## 2010-09-25 DIAGNOSIS — R0789 Other chest pain: Secondary | ICD-10-CM | POA: Diagnosis present

## 2010-09-25 DIAGNOSIS — R002 Palpitations: Secondary | ICD-10-CM | POA: Diagnosis present

## 2010-09-25 DIAGNOSIS — R0602 Shortness of breath: Secondary | ICD-10-CM | POA: Diagnosis present

## 2010-09-25 DIAGNOSIS — R799 Abnormal finding of blood chemistry, unspecified: Secondary | ICD-10-CM | POA: Diagnosis present

## 2010-09-25 DIAGNOSIS — Y840 Cardiac catheterization as the cause of abnormal reaction of the patient, or of later complication, without mention of misadventure at the time of the procedure: Secondary | ICD-10-CM | POA: Diagnosis present

## 2010-09-25 DIAGNOSIS — Z9221 Personal history of antineoplastic chemotherapy: Secondary | ICD-10-CM

## 2010-09-25 DIAGNOSIS — C20 Malignant neoplasm of rectum: Secondary | ICD-10-CM | POA: Diagnosis present

## 2010-09-25 DIAGNOSIS — D72829 Elevated white blood cell count, unspecified: Secondary | ICD-10-CM | POA: Diagnosis present

## 2010-09-25 DIAGNOSIS — R000 Tachycardia, unspecified: Secondary | ICD-10-CM | POA: Diagnosis present

## 2010-09-25 DIAGNOSIS — Z923 Personal history of irradiation: Secondary | ICD-10-CM

## 2010-09-25 DIAGNOSIS — D638 Anemia in other chronic diseases classified elsewhere: Secondary | ICD-10-CM | POA: Diagnosis present

## 2010-09-25 LAB — URINALYSIS, ROUTINE W REFLEX MICROSCOPIC
Hgb urine dipstick: NEGATIVE
Ketones, ur: NEGATIVE mg/dL
Protein, ur: NEGATIVE mg/dL
Urobilinogen, UA: 0.2 mg/dL (ref 0.0–1.0)

## 2010-09-25 LAB — CBC
MCH: 24.7 pg — ABNORMAL LOW (ref 26.0–34.0)
MCHC: 29.9 g/dL — ABNORMAL LOW (ref 30.0–36.0)
Platelets: 404 10*3/uL — ABNORMAL HIGH (ref 150–400)
RDW: 15.6 % — ABNORMAL HIGH (ref 11.5–15.5)

## 2010-09-25 LAB — DIFFERENTIAL
Basophils Relative: 0 % (ref 0–1)
Eosinophils Absolute: 0 10*3/uL (ref 0.0–0.7)
Eosinophils Relative: 0 % (ref 0–5)
Monocytes Absolute: 1.2 10*3/uL — ABNORMAL HIGH (ref 0.1–1.0)
Monocytes Relative: 9 % (ref 3–12)

## 2010-09-25 LAB — CK TOTAL AND CKMB (NOT AT ARMC)
CK, MB: 1.9 ng/mL (ref 0.3–4.0)
Relative Index: INVALID (ref 0.0–2.5)

## 2010-09-25 LAB — URINE MICROSCOPIC-ADD ON

## 2010-09-25 LAB — BASIC METABOLIC PANEL
Calcium: 10.3 mg/dL (ref 8.4–10.5)
Glucose, Bld: 92 mg/dL (ref 70–99)
Sodium: 135 mEq/L (ref 135–145)

## 2010-09-25 LAB — TYPE AND SCREEN: ABO/RH(D): B POS

## 2010-09-25 LAB — MAGNESIUM: Magnesium: 1.7 mg/dL (ref 1.5–2.5)

## 2010-09-25 LAB — PROTIME-INR: Prothrombin Time: 17.2 seconds — ABNORMAL HIGH (ref 11.6–15.2)

## 2010-09-25 MED ORDER — IOHEXOL 300 MG/ML  SOLN
80.0000 mL | Freq: Once | INTRAMUSCULAR | Status: AC | PRN
  Administered 2010-09-25: 80 mL via INTRAVENOUS

## 2010-09-26 ENCOUNTER — Inpatient Hospital Stay (HOSPITAL_COMMUNITY): Payer: Medicaid Other

## 2010-09-26 LAB — CARDIAC PANEL(CRET KIN+CKTOT+MB+TROPI)
CK, MB: 1.8 ng/mL (ref 0.3–4.0)
CK, MB: 1.6 ng/mL (ref 0.3–4.0)
Relative Index: INVALID (ref 0.0–2.5)
Relative Index: INVALID (ref 0.0–2.5)
Total CK: 24 U/L (ref 7–177)
Troponin I: 0.36 ng/mL (ref <0.30)
Troponin I: <0.3 ng/mL (ref <0.30)

## 2010-09-26 LAB — HEPARIN LEVEL (UNFRACTIONATED)
Heparin Unfractionated: <0.1 IU/mL — ABNORMAL LOW (ref 0.30–0.70)
Heparin Unfractionated: 1.37 IU/mL — ABNORMAL HIGH (ref 0.30–0.70)

## 2010-09-26 LAB — PRO B NATRIURETIC PEPTIDE: Pro B Natriuretic peptide (BNP): 105.5 pg/mL (ref 0–125)

## 2010-09-26 LAB — COMPREHENSIVE METABOLIC PANEL
AST: 88 U/L — ABNORMAL HIGH (ref 0–37)
Albumin: 2.1 g/dL — ABNORMAL LOW (ref 3.5–5.2)
BUN: 13 mg/dL (ref 6–23)
Calcium: 9.6 mg/dL (ref 8.4–10.5)
Chloride: 98 mEq/L (ref 96–112)
Creatinine, Ser: <0.47 mg/dL — ABNORMAL LOW (ref 0.50–1.10)
Total Bilirubin: 0.5 mg/dL (ref 0.3–1.2)
Total Protein: 7 g/dL (ref 6.0–8.3)

## 2010-09-26 LAB — CBC
Hemoglobin: 8.8 g/dL — ABNORMAL LOW (ref 12.0–15.0)
MCH: 25 pg — ABNORMAL LOW (ref 26.0–34.0)
RBC: 3.52 MIL/uL — ABNORMAL LOW (ref 3.87–5.11)
WBC: 5.7 10*3/uL (ref 4.0–10.5)

## 2010-09-26 LAB — LIPID PANEL: Cholesterol: 114 mg/dL (ref 0–200)

## 2010-09-26 LAB — TSH: TSH: 0.339 u[IU]/mL — ABNORMAL LOW (ref 0.350–4.500)

## 2010-09-27 NOTE — Discharge Summary (Signed)
NAMETANARA, TURVEY NO.:  [id number]  MEDICAL RECORD NO.:  [id number]  LOCATION:  1411                         FACILITY:  [facility]  PHYSICIAN:  Brendia Sacks, MD    DATE OF BIRTH:  [birth year]  DATE OF ADMISSION:  09/25/2010 DATE OF DISCHARGE:  09/26/2010                              DISCHARGE SUMMARY   PRIMARY SURGEON:  Herbie Saxon, MD, at [facility].  PRIMARY ONCOLOGIST:  Jamie Brookes. Odogwu, MD  CONDITION ON DISCHARGE:  Improved.  DISPOSITION:  Home with resuming her home health, wound care, and TPN.  DISCHARGE DIAGNOSES: 1. Acute chest pain with shortness of breath, resolved, noncardiac. 2. Dehydration, resolved. 3. Malfunctioning PICC line, replaced. 4. Elevated troponins, clinically insignificant.  HISTORY OF PRESENT ILLNESS:  This is a 40 year old woman who developed chest pain, was sent to the emergency room.  Whe labs were drawn at home from her PICC line she noticed air in the tubing and it seemed to be leaking.   She became quite distressed about this and developed chest pain and shortness  of breath.  In the emergency room, she was found to be tachycardic and to have  a mild elevation of her troponin.  She was admitted for further evaluation.  HOSPITAL COURSE: 1. Elevated troponin.  The patient was admitted, subsequent troponins     did return back to normal.  She had no further chest pain or     shortness of breath.  CT scan was negative for pulmonary embolism.     Her history is very suggestive of anxiety as the etiology, given the onset     of her symptoms in the context of his malfunctioning PICC line.  She was seen      in consultation with     Cardiology.  She declined any invasive evaluation and it was felt     that the sudden rise and then fall of her troponin was not     consistent with ACS.  No further recommendations were made.  The     patient desires no further workup and would like to be  discharged     home. 2. Noncardiac chest pain.  As described above, the patient developed     anxiety after her PICC was noted to be malfunctioning.  She has had     no further chest pain and is stable for discharge. 3. Malfunctioning of PICC line.  This was exchanged today by     Interventional Radiology and it is okay for immediate use. 4. Complex surgical history.  I did discuss the case with the     patient's primary surgeon, Dr. Herbie Saxon.  He did advise that     the patient can follow up with him in the office next week for     drain removal.  He was in agreement with discharge home with     outpatient followup with him in the office.  CONSULTATIONS: 1. Interventional Radiology for PICC line exchange. 2. [facility] recommendations as     above. 3. The patient's oncologist, [name], saw the patient in  consultation.  PROCEDURES:  Exchange of PICC line as described above.  IMAGING: 1. Chest x-ray, June 14th:  No active disease.  PICC line in     appropriate position. 2. CT angiogram of the chest, June 14th:  No evidence of pulmonary     embolism given motion degraded exam.  Development of     borderline/mild right hilar adenopathy indeterminate.  Follow up     with [name] as clinically indicated.  MICROBIOLOGY:  None.  ANCILLARY STUDIES: 1. A 2-D echocardiogram showed left ventricular ejection fraction 55%     to 60%.  Wall motion was normal.  There were no regional wall     motion abnormalities. 2. EKG showed sinus tachycardia with no acute changes.  PERTINENT LABORATORY STUDIES: 1. TSH was mildly depressed.  This can be further evaluated and repeated     in the outpatient setting.  Clinical significance of this is     unclear at this point. 2. Lipid panel was essentially unremarkable. 3. BNP was within normal limits. 4. Urine pregnancy was negative. 5. Hemoglobin was stable at 8.8. 6. Basic metabolic panel was  essentially unremarkable. 7. Hepatic function panel was notable for an AST of 88, ALT of 164,     alkaline phosphatase 195.  This is likely secondary to the     patient's TPN.  Follow up in the outpatient setting as clinically     indicated. 8. Cardiac enzymes notable for troponin of 0.67 on admission and less     than 0.30 (which is within normal limits) on discharge.  PHYSICAL EXAMINATION:  GENERAL:  On discharge, the patient is feeling well.  She has had no chest pain or shortness breath.  She very much wants to go home. VITAL SIGNS:  Temperature is 98.4, pulse 86, respirations 18, blood pressure ranging from 91 to 102 over 61 to 64.  Saturation 99% on room air. CARDIOVASCULAR:  Regular rate and rhythm.  No murmur, rub, or gallop. RESPIRATORY:  Clear to auscultation bilaterally.  No wheezes, rales, or rhonchi.  Normal respiratory effort. EXTREMITIES:  No lower extremity edema.  DISCHARGE INSTRUCTIONS:  The patient will be discharged home.  She should continue her home health wound care, drain care, and TPN as directed.  Follow up with Dr. Herbie Saxon in 3-5 days and follow up with [name] as per her recommendations.  DISCHARGE MEDICATIONS: 1. No changes, may continue TPN as directed. 2. Gabapentin 300 mg p.o. t.i.d. 3. Methadone 5 mg p.o. t.i.d. 4. Zofran 8 mg every 6 hours as needed for nausea or vomiting. 5. Oxycodone 5 mg 5 tablets by mouth every 3 hours as needed for pain.  Time coordinating discharge including discussion with the patient's oncologist and her surgical oncologist is 35 minutes.     Brendia Sacks, MD     DG/MEDQ  D:  09/26/2010  T:  09/27/2010  Job:  [id number]  cc:   Herbie Saxon, MD Fax: [phone]  Electronically Signed by Brendia Sacks  on 09/27/2010 04:25:29 PM

## 2010-09-29 NOTE — Consult Note (Signed)
[name], [name] NO.:  [id number]  MEDICAL RECORD NO.:  [id number]  LOCATION:  1411                         FACILITY:  [facility]  PHYSICIAN:  Landry Corporal, MDDATE OF BIRTH:  1970-06-04  DATE OF CONSULTATION:  09/26/2010 DATE OF DISCHARGE:  09/26/2010                                CONSULTATION   REASON FOR CONSULTATION:  Palpitations, shortness of breath, and mildly elevated troponin.  HISTORY OF PRESENT ILLNESS:  Heather Mayer is very unfortunate 40 year old woman with a history of metastatic rectal cancer, status post multiple debulking surgeries most recently in earlier March 2012 where she still has drains.  She has had a diverting loop ileostomy.  She has had TAH- BSO associated with this, and she has also had chemo-radiation and is still concerned about the possibility of being metastatic still.  She was doing relatively okay, in a normal state of health, getting TPN feeds and when her home health nurse was out visiting her last night, she was working on the access in her PICC and she developed sudden onset of shortness of breath, could not catch her breath, had to open the door to try to get some fresh air.  She knows her heart was racing.  Did not feel like, she is really pass out, woozy or dizzy, but she, associated with this, started feeling some heaviness in her chest that radiated to her back.  This was in conjunction with her other abdominal pains that she has from her operations.  She was quite anxious and stressed out by this, but she does not have any history of panic attacks and has relatively comfortable with having her PICC access.  Upon arrival to the [facility] Emergency Room was noted to be somewhat hemoconcentrated on labs with a increase of 1 unit of her hemoglobin in a couple of days which should be suggestive of dehydration.  She also had a PE protocol CT which had some motion artifact and so therefore not able to really see too  far down, but no large PE noted.  We were contacted because in the setting of this episode, she has some mildly elevated troponin to 0.67, which over the course of the evening have actually gone back down to where the most recent set was still being less than 0.3.  She now is feeling fine.  She had been given some IV fluids and is feeling much better.  She actually felt that her symptoms felt much better after she got some oxygen in the ambulance and has not been having issues with that since.  She was still a little tachycardia when she got to the emergency room, but it has been more down to the 90s now. Upon initial eval in the emergency room, her heart rate was in the 120s, but apparently the EMS said that her heart rate is up in the 140s to 150s.  Her blood pressure were borderline anyway with baseline in the high 90s to low 100s over 50s to 60s.  PAST MEDICAL HISTORY: 1. 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     a.     She  has had initially thought to be chronic appendicitis,      history of appendectomy in the process of doing this, had a      fibroma resection, that was initially in January 11 .  In April      2012, she had debulking surgery which peak of this time, she had      total pelvic excoriation and TAH-BSO, rectal resection,      omentectomy and diverting loop ileostomy.     b.     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     c.     She has had likely small bowel obstruction versus ileus      versus adhesions and a most recent hospitalization was for      enteritis with hypomagnesemia and hypokalemia. 2. Anemia of chronic disease, but her last hemoglobin was not     extremely well.  ALLERGIES:  COMPAZINE causes numbness in the mouth.  HOME MEDICATIONS:  Methadone, Neurontin, oxycodone, TPN.  In the hospital, she is on aspirin and heparin as well as IV fluids.  She is also  on Protonix, Dilaudid, albuterol, Atrovent as well as Zofran.  SOCIAL HISTORY:  She lives in [place] with her 55-year-old son.  She does not smoke or drink, use IV drugs.  She has home health.  She is scheduled to have a very important in mind appointment with Dr. [name] who is her surgical oncologist in [facility] where she is supposed to be evaluated to potentially have her drains pulled.  She would really like to get to do this even if it means transferring of care to [facility].  FAMILY HISTORY:  No significant cardiac history that she can tell of. No premature diabetes, etc.  REVIEW OF SYSTEMS:  No further chest discomfort.  She just has abdominal pain.  All other review of systems essentially by the HPI except for last night, she has not had any chest pain with rest or exertion leading up to this.  No significant dyspnea on exertion besides that she is just kind of exhausted, but no PND, orthopnea, edema, no lightheadedness, dizziness, wooziness, syncope or near syncope and never had any palpitations before.  No claudication symptoms.  Her most of her problems are due to generalized wasting and dehydration, abdominal pains from her surgeries and drains.  PHYSICAL EXAMINATION:  GENERAL:  She is very frail, cachectic-appearing young woman who just seems exhausted and emotionally distress, but not in any acute physical medical distress.  She is awake, alert, and oriented x3.  She is just uncomfortable due to her abdominal pain from her surgeries and back pain from lying in bed. VITAL SIGNS:  Her blood pressures are 99/62, heart rate is now down into the 90s.  She is on 2 liters satting 99%, respiratory rate is 12-16. HEENT:  She has got sunken temporal wasting as well as periorbital wasting.  Extraocular muscles are intact.  No jaundice.  No icterus. NECK:  Supple.  No JVD.  No lymphadenopathy and no carotid bruit. LUNGS:  Clear to auscultation  bilaterally.  Normal respiratory effort. Good air movement. CARDIAC:  Mildly tachycardic, but normal S1 and S2.  No murmurs, rubs, or gallops.  Normal placed PMI. ABDOMEN:  Very thin and frail, mild diffuse discomfort due to all her surgeries.  Did get a colostomy bag in the right lower quadrant.  She has got 2  drains in the left lower quadrant, they are extremely tender. EXTREMITIES:  No clubbing, cyanosis, or edema.  Frail, very thin legs. SKIN:  She is overall is cachectic-appearing, but rashes or ulcers.  LABORATORY DATA: 1. Urinalysis negative, just mild leukocyte esterase. 2. CBC, white count 5.7, hemoglobin is 8.8, and platelets 315. 3. Electrolytes, sodium 134, potassium 4.3, chloride 98 bicarb 27, BUN     and creatinine 13 over 0.47.  Alk phos low at 195, AST is 90 is a little bit of 88 and ALT is 164.  Albumin is 2.1.  Cardiac enzymes, CK-MB and troponin CK has been 24, 26, 24, MB has been 1.9, 1.8 and 1.6.  Troponin went from 0.6 to 0.36 to less than 0.3, which is a rather rapid rise in decline for acute coronary syndrome. 1. EKG is sinus tachy with nonspecific ST-T changes with possible U-     waves. 2. Chest x-ray, which I personally reviewed and had no acute     cardiopulmonary disease, nothing to suggest pulmonary edema, which     causes dyspnea. 3. CTA, was noted to have some motion artifacts, No large PEs, but     there is also right hilar lymphadenopathy, which was concern for     possible nodal metastasis.  IMPRESSION:  Mildly elevated troponin in the setting of rapid heart rate likely due to dehydration and no signs of infection.  I do not think this was a panic attack.  I do not know what it was.  I am still concern that this could possibly be a small PE especially when it was involved accessing her PICC.  Could she have had some embolis from there even an air embolus that could have made her temporarily have rapid heart rate. Certainly, ACS could be  possibility, but talked to her about her wishes and she would really prefer for early conservative therapy.  RECOMMENDATIONS:  Check an echocardiogram just to make sure there are no gross abnormalities and wall motion.  I agree with dehydrating her as you are and would recommend continuing heparin for at least 48 hours and consider checking a V/Q scan to see if there is any subsegmental PE, which would warrant prolonged anticoagulation in a patient with metastatic cancer, which makes PE a much higher likely diagnosis than ACS.  She has no risk factors for ACS.  Also, continue aspirin. 1. We can consider probably doing outpatient stress testing is to just     better evaluate the coronaries, but I referred for an ischemia, but     think that she has tolerated the large operations without any     untoward events.  I think the rapid decline of the troponin would     argue against a true myocardial infarction.  Maybe if there was     ischemia, it was due to demand ischemia with rapid heart rate.  If she would stay in patient at [facility], I would recommend 48-72 hours of heparin as long as she has no bleeding just to be sure and as well as support management.  She does not interested in heart catheterization, so doing a stress test would simply be for risk stratification, so that can be done as an outpatient unless she has any decompensation.  She also did reiterate her desire to make it to this appointment with Dr. [name] and I think if we could facilitate possibly transferring her to [facility] where she could have the rest of her care continued  then may be the best option for her to still see them and be continued to be cared for this current event most likely recent event.  Please do not hesitate to contact if you have any questions or concerns. We will monitor and await the echocardiogram to read.  I did ask the emergency room doctor to get this ordered so hopefully that should be done.   Please do not hesitate to contact [place] with any questions.          ______________________________ Landry Corporal, MD     DWH/MEDQ  D:  09/26/2010  T:  09/27/2010  Job:  [id number]  cc:   [employer].  Electronically Signed by Bryan Lemma MD on 09/29/2010 07:29:18 AM

## 2010-10-03 DIAGNOSIS — C20 Malignant neoplasm of rectum: Secondary | ICD-10-CM

## 2010-10-12 NOTE — H&P (Signed)
Heather Mayer, Heather Mayer                 ACCOUNT NO.:  [id number]  MEDICAL RECORD NO.:  [id number]  LOCATION:  1411                         FACILITY:  [facility]  PHYSICIAN:  Lonia Blood, [name]      DATE OF BIRTH:  06-06-1970  DATE OF ADMISSION:  09/25/2010 DATE OF DISCHARGE:                             HISTORY & PHYSICAL   PRIMARY CARE PHYSICIAN:  Unassigned.  ONCOLOGIST:  Laurice Record, [name]  PRESENTING COMPLAINTS:  Sudden onset of shortness of breath and palpitations.  HISTORY OF PRESENT ILLNESS:  The patient is a 40 year old female with metastatic rectal cancer status post multiple surgeries who was at home with her home health care aide when she suddenly developed shortness of breath and palpitations.  She was also having some chest pain, substernal, radiating to her back.  Her home health nurse did some labs at that time, she was on TPN as they noticed slight leakage from the TPN line.  She was EMS which arrived 50 minutes later and put some oxygen on her and she started feeling better.  On arrival in the ED, she was still tachycardic, however, she was not having any problem, she was not having any hypoxia or any shortness of breath.  She has had continued pain and she is stressed out.  Her last chemo was apparently in February 2012. 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 [name] recently on April 2012 and she has two drains placed which are still active.  The patient was seen in the ED and initially was worried about possibility of PE which has so far been ruled out.  The patient was however noted to have elevated troponin and some nonspecific ST-T wave changes.  She is being admitted for further management.  PAST MEDICAL HISTORY:  Significant mainly for metastatic rectal cancer.  PAST SURGICAL HISTORY:  Status post appendectomy, status post colostomy, status post hysterectomy, status post chemo, status post  Port-A-Cath insertion.  ALLERGIES:  COMPAZINE that causes some numbness in her mouth.  CURRENT MEDICATIONS:  Include methadone, Neurontin, oxycodone and TPN  SOCIAL HISTORY:  She lives in [place].  She denied tobacco, alcohol or IV drug use.  She has home health coming in to help her.  FAMILY HISTORY:  Denied any family history of colon cancer or any significant disease.  REVIEW OF SYSTEMS:  Denied any ongoing chest pain.  Otherwise, all systems reviewed are negative except per HPI.  PHYSICAL EXAMINATION:  VITAL SIGNS:  On her exam, temperature is 98.2 orally.  Initial blood pressure 99/62 with pulse of 113, respiratory rate is 12.  Sats 99% on 2 L. GENERAL:  The patient is awake, alert, oriented.  She seemed to be in mild distress due to pain. HEENT:  PERRL.  EOMI.  No pallor, no jaundice, no rhinorrhea. NECK:  Supple.  No visible JVD, no lymphadenopathy. RESPIRATORY:  She has good air entry bilaterally.  No wheezes, no rales, no crackles. CARDIOVASCULAR SYSTEM:  She is tachycardic. ABDOMEN:  Scaphoid.  Mild diffuse discomfort.  She has a colostomy bag on the right lower quadrant and 2 drains on the left lower quadrants. EXTREMITIES:  No edema, cyanosis or clubbing. SKIN EXAM:  The patient is slightly cachectic, otherwise, no significant rashes or ulcers. MUSCULOSKELETAL:  Again she has some muscle wasting.  Otherwise, no joint swelling, tenderness or disfigurement.  LABORATORY DATA:  Her urinalysis is essentially negative.  Troponin 0.67 first set, second set 0.60.  Her total CK is only 24 with MB of 1.4. Her sodium is sodium 135, potassium 4.6, chloride 95, CO2 of 30, glucose 92, BUN 15, creatinine less than 0.47, calcium 10.3, magnesium 1.7. White count is 12.4 with hemoglobin of 9.6, platelet count 404.  Chest x- ray showed new left PICC line in appropriate position but no active disease otherwise.  Head CT without contrast showed motion degraded exam.  Chest CT with  contrast, no evidence of pulmonary embolism.  There is some borderline mild right hilar adenopathy noted, metastasis cannot not be excluded.  ASSESSMENT:  This is a 40 year old female with metastatic rectal cancer presenting with some dehydration, transient shortness of breath and palpitation.  The palpitation is also associated with mild troponin increase.  More than likely coming from the dehydration.  PLAN: 1. Dehydration.  We will admit the patient and gently hydrate her, use     saline, and follow her BUN and creatinine closely.  Also put her on     tele monitor, heart rate. 2. Palpitations.  Again we will monitor the patient on tele, hydrate     her and continue to follow her heart rate until it is improved.     Anxiety may be playing a role and so we will try some anxiolytics     also. 3. Metastatic rectal cancer.  This will be followed by [name] who     has been her oncologist. 4. Anemia of chronic disease, more than likely from her rectal cancer.     We will follow her H and H closely especially after hydration. 5. Elevated troponins.  Cardiology has been contacted, [facility] to     be exact.  They shall follow the patient closely.  Further     treatment will depend on how the patient responds to these     measures.     Lonia Blood, [name]     Verlin Grills  D:  09/26/2010  T:  09/26/2010  Job:  [id number]  Electronically Signed by Lonia Blood [name] on 10/12/2010 12:32:24 AM

## 2011-01-11 ENCOUNTER — Encounter (HOSPITAL_BASED_OUTPATIENT_CLINIC_OR_DEPARTMENT_OTHER): Payer: Self-pay | Admitting: *Deleted

## 2011-01-11 ENCOUNTER — Emergency Department (HOSPITAL_BASED_OUTPATIENT_CLINIC_OR_DEPARTMENT_OTHER)
Admission: EM | Admit: 2011-01-11 | Discharge: 2011-01-11 | Disposition: A | Discharge status: Home / Self Care | Payer: Medicaid Other | Attending: Emergency Medicine | Admitting: Emergency Medicine

## 2011-01-11 DIAGNOSIS — K5289 Other specified noninfective gastroenteritis and colitis: Secondary | ICD-10-CM | POA: Insufficient documentation

## 2011-01-11 DIAGNOSIS — R110 Nausea: Secondary | ICD-10-CM | POA: Insufficient documentation

## 2011-01-11 DIAGNOSIS — K529 Noninfective gastroenteritis and colitis, unspecified: Secondary | ICD-10-CM

## 2011-01-11 DIAGNOSIS — R109 Unspecified abdominal pain: Secondary | ICD-10-CM | POA: Insufficient documentation

## 2011-01-11 LAB — BASIC METABOLIC PANEL
CO2: 30 mEq/L (ref 19–32)
Calcium: 10.3 mg/dL (ref 8.4–10.5)
Chloride: 100 mEq/L (ref 96–112)
Creatinine, Ser: 0.7 mg/dL (ref 0.50–1.10)
GFR calc Af Amer: >60 mL/min (ref >60)
Sodium: 139 mEq/L (ref 135–145)

## 2011-01-11 LAB — CBC
MCH: 27.6 pg (ref 26.0–34.0)
MCV: 85 fL (ref 78.0–100.0)
Platelets: 314 10*3/uL (ref 150–400)
RBC: 4.46 MIL/uL (ref 3.87–5.11)
RDW: 15.1 % (ref 11.5–15.5)
WBC: 7.8 10*3/uL (ref 4.0–10.5)

## 2011-01-11 LAB — URINALYSIS, ROUTINE W REFLEX MICROSCOPIC
Hgb urine dipstick: NEGATIVE
Nitrite: NEGATIVE
Protein, ur: NEGATIVE mg/dL
Specific Gravity, Urine: 1.015 (ref 1.005–1.030)
Urobilinogen, UA: 0.2 mg/dL (ref 0.0–1.0)

## 2011-01-11 LAB — PREGNANCY, URINE: Preg Test, Ur: NEGATIVE

## 2011-01-11 MED ORDER — HYDROMORPHONE HCL 1 MG/ML IJ SOLN
1.0000 mg | Freq: Once | INTRAMUSCULAR | Status: AC
  Administered 2011-01-11: 1 mg via INTRAVENOUS
  Filled 2011-01-11: qty 1

## 2011-01-11 MED ORDER — SODIUM CHLORIDE 0.9 % IV BOLUS (SEPSIS): 500.0000 mL | Freq: Once | INTRAVENOUS | Status: DC

## 2011-01-11 MED ORDER — ONDANSETRON HCL 4 MG PO TABS: 8.0000 mg | ORAL_TABLET | Freq: Three times a day (TID) | ORAL | Status: AC | PRN

## 2011-01-11 MED ORDER — ONDANSETRON HCL 4 MG/2ML IJ SOLN
4.0000 mg | Freq: Once | INTRAMUSCULAR | Status: AC
  Administered 2011-01-11: 4 mg via INTRAVENOUS
  Filled 2011-01-11: qty 2

## 2011-01-11 NOTE — ED Notes (Signed)
Asked by Nettie Elm, RN to attempt IV access after she was unsuccessful x2 attempts.  Upon inserting the catheter in the skin, the patient began to flail on the stretcher and crying stating, "It hurts too bad, it feels like you are stabbing me."  Catheter was removed.  I did not attempt IV access a fourth time.  Nettie Elm, RN notified.

## 2011-01-11 NOTE — ED Notes (Signed)
Patient states about an hour ago began having sharp abd pain.

## 2011-01-11 NOTE — ED Provider Notes (Signed)
History     CSN: [id number] Arrival date & time: 01/11/2011  3:15 AM  Chief Complaint  Patient presents with  . Abdominal Pain    (Consider location/radiation/quality/duration/timing/severity/associated sxs/prior treatment) Patient is a 40 y.o. female presenting with abdominal pain.  Abdominal Pain The primary symptoms of the illness include abdominal pain.   patient reports the development of acute sharp abdominal pain as well as crampy abdominal pain approximately 2-3 hours ago.  She has a history of rectal carcinoma status post colostomy.  She reports significant increase in colostomy output in the last hour or 2 that has been watery in nature.  There is no evidence of blood in the colostomy.  Her colostomy output is green.  She reports nausea she denies vomiting.  She denies fever or recent sick contacts.  She denies headache.  She denies urinary symptoms and denies vaginal bleeding as well as vaginal discharge.  Nothing improves her symptoms are worsens her symptoms.  Her symptoms are described as mild in severity.  She requests something to treat her abdominal cramping.   Past Medical History  Diagnosis Date  . Colorectal cancer     colo-rectal ca     Past Surgical History  Procedure Date  . Abdominal surgery   . Ostomy take down   . Rectoperitoneal fistula closure     No family history on file.  History  Substance Use Topics  . Smoking status: Never Smoker   . Smokeless tobacco: Not on file  . Alcohol Use: No    OB History    Grav Para Term Preterm Abortions TAB SAB Ect Mult Living                  Review of Systems  Gastrointestinal: Positive for abdominal pain.  All other systems reviewed and are negative.    Allergies  Compazine  Home Medications   Current Outpatient Rx  Name Route Sig Dispense Refill  . METHADONE HCL 10 MG PO TABS Oral Take 5 mg by mouth every 8 (eight) hours as needed.      [name] ONDANSETRON HCL 8 MG PO TABS Oral Take by mouth every 8  (eight) hours as needed.      . OXYCODONE HCL 10 MG PO TB12 Oral Take 10 mg by mouth every 12 (twelve) hours.        BP 98/48  Pulse 77  Temp(Src) 98 F (36.7 C) (Oral)  Resp 18  SpO2 99%  Physical Exam  Nursing note and vitals reviewed. Constitutional: She is oriented to person, place, and time. She appears well-developed and well-nourished. No distress.  HENT:  Head: Normocephalic and atraumatic.  Eyes: EOM are normal.  Neck: Normal range of motion.  Cardiovascular: Normal rate, regular rhythm and normal heart sounds.   Pulmonary/Chest: Effort normal and breath sounds normal.  Abdominal: Soft. She exhibits no distension. There is no tenderness.       Well-healed midline abdominal scar present.  There is a colostomy in her right lower quadrant with green watery output.  The skin surrounding her colostomy is intact without evidence of erythema or tenderness.  Her abdomen is nontender on exam  Musculoskeletal: Normal range of motion.  Neurological: She is alert and oriented to person, place, and time.  Skin: Skin is warm and dry.  Psychiatric: She has a normal mood and affect. Judgment normal.    ED Course  Procedures (including critical care time)   Labs Reviewed  PREGNANCY, URINE  URINALYSIS, ROUTINE W  REFLEX MICROSCOPIC  CBC  BASIC METABOLIC PANEL   No results found.   1. Gastroenteritis       MDM  Suspect viral gastroenteritis at this time with nausea and increased watery colostomy output.  Abdomen is benign on exam.  Baseline labs and fluids at this time.  Will treat abdominal cramping with pain medicine and antiemetics given  6:18 AM Patient feels much better at this time.  She is able to ambulate to the bathroom without difficulty.  Her urine and urine pregnancy are negative.  Her labs are normal as well DC home at this time.  Home with a prescription for anti-emetics        Lyanne Co, MD 01/11/11 [phone]

## 2011-01-14 ENCOUNTER — Encounter (HOSPITAL_BASED_OUTPATIENT_CLINIC_OR_DEPARTMENT_OTHER): Payer: Medicaid Other | Admitting: Hematology and Oncology

## 2011-01-14 ENCOUNTER — Other Ambulatory Visit: Payer: Self-pay | Admitting: Hematology and Oncology

## 2011-01-14 DIAGNOSIS — C20 Malignant neoplasm of rectum: Secondary | ICD-10-CM

## 2011-01-14 LAB — CBC WITH DIFFERENTIAL/PLATELET
BASO%: 0.2 % (ref 0.0–2.0)
EOS%: 0.7 % (ref 0.0–7.0)
HCT: 39.9 % (ref 34.8–46.6)
LYMPH%: 13.6 % — ABNORMAL LOW (ref 14.0–49.7)
MCH: 28.6 pg (ref 25.1–34.0)
MCHC: 33.2 g/dL (ref 31.5–36.0)
MONO#: 0.7 10*3/uL (ref 0.1–0.9)
NEUT%: 75 % (ref 38.4–76.8)
Platelets: 296 10*3/uL (ref 145–400)
RBC: 4.64 10*6/uL (ref 3.70–5.45)
WBC: 6.4 10*3/uL (ref 3.9–10.3)
lymph#: 0.9 10*3/uL (ref 0.9–3.3)

## 2011-01-14 LAB — COMPREHENSIVE METABOLIC PANEL
ALT: 12 U/L (ref 0–35)
AST: 16 U/L (ref 0–37)
Creatinine, Ser: 0.78 mg/dL (ref 0.50–1.10)
Sodium: 139 mEq/L (ref 135–145)
Total Bilirubin: 0.5 mg/dL (ref 0.3–1.2)
Total Protein: 7.4 g/dL (ref 6.0–8.3)

## 2011-02-05 ENCOUNTER — Other Ambulatory Visit (HOSPITAL_COMMUNITY): Payer: Self-pay | Admitting: Hematology and Oncology

## 2011-02-05 DIAGNOSIS — C19 Malignant neoplasm of rectosigmoid junction: Secondary | ICD-10-CM | POA: Insufficient documentation

## 2011-02-14 ENCOUNTER — Other Ambulatory Visit: Payer: Self-pay | Admitting: Hematology and Oncology

## 2011-02-18 ENCOUNTER — Ambulatory Visit (HOSPITAL_BASED_OUTPATIENT_CLINIC_OR_DEPARTMENT_OTHER): Payer: Medicaid Other

## 2011-02-18 DIAGNOSIS — Z452 Encounter for adjustment and management of vascular access device: Secondary | ICD-10-CM

## 2011-02-18 DIAGNOSIS — C20 Malignant neoplasm of rectum: Secondary | ICD-10-CM

## 2011-02-18 MED ORDER — HEPARIN SOD (PORK) LOCK FLUSH 100 UNIT/ML IV SOLN
500.0000 [IU] | Freq: Once | INTRAVENOUS | Status: AC
  Administered 2011-02-18: 500 [IU] via INTRAVENOUS
  Filled 2011-02-18: qty 5

## 2011-02-18 MED ORDER — SODIUM CHLORIDE 0.9 % IJ SOLN
10.0000 mL | INTRAMUSCULAR | Status: DC | PRN
  Administered 2011-02-18: 10 mL via INTRAVENOUS
  Filled 2011-02-18: qty 10

## 2011-03-08 ENCOUNTER — Encounter (HOSPITAL_BASED_OUTPATIENT_CLINIC_OR_DEPARTMENT_OTHER): Payer: Self-pay | Admitting: Emergency Medicine

## 2011-03-08 ENCOUNTER — Emergency Department (HOSPITAL_BASED_OUTPATIENT_CLINIC_OR_DEPARTMENT_OTHER)
Admission: EM | Admit: 2011-03-08 | Discharge: 2011-03-08 | Disposition: A | Discharge status: Home / Self Care | Payer: Self-pay | Attending: Emergency Medicine | Admitting: Emergency Medicine

## 2011-03-08 DIAGNOSIS — F1123 Opioid dependence with withdrawal: Secondary | ICD-10-CM

## 2011-03-08 DIAGNOSIS — F1120 Opioid dependence, uncomplicated: Secondary | ICD-10-CM | POA: Insufficient documentation

## 2011-03-08 DIAGNOSIS — M549 Dorsalgia, unspecified: Secondary | ICD-10-CM | POA: Insufficient documentation

## 2011-03-08 DIAGNOSIS — F19939 Other psychoactive substance use, unspecified with withdrawal, unspecified: Secondary | ICD-10-CM | POA: Insufficient documentation

## 2011-03-08 MED ORDER — OXYCODONE HCL 10 MG PO TB12: 10.0000 mg | ORAL_TABLET | Freq: Two times a day (BID) | ORAL | Status: AC

## 2011-03-08 MED ORDER — OXYCODONE-ACETAMINOPHEN 5-325 MG PO TABS: 2.0000 | ORAL_TABLET | ORAL | Status: DC | PRN

## 2011-03-08 NOTE — ED Provider Notes (Signed)
History     CSN: [id number] Arrival date & time: 03/08/2011  9:55 AM   First MD Initiated Contact with Patient 03/08/11 1037      No chief complaint on file.   (Consider location/radiation/quality/duration/timing/severity/associated sxs/prior treatment) HPI Comments: Has history of rectal ca, s/p surgery, chemo, radiation.  Stopped taking percocet 4 days ago after running out.  Now feels horrible.  Has pain all over, nausea, and no energy.  No fevers, changes in bowel habits or other complaints.  The history is provided by the patient.    Past Medical History  Diagnosis Date  . Colorectal cancer     colo-rectal ca     Past Surgical History  Procedure Date  . Abdominal surgery   . Ostomy take down   . Rectoperitoneal fistula closure     No family history on file.  History  Substance Use Topics  . Smoking status: Never Smoker   . Smokeless tobacco: Not on file  . Alcohol Use: No    OB History    Grav Para Term Preterm Abortions TAB SAB Ect Mult Living                  Review of Systems  All other systems reviewed and are negative.    Allergies  Compazine  Home Medications   Current Outpatient Rx  Name Route Sig Dispense Refill  . METHADONE HCL 10 MG PO TABS Oral Take 5 mg by mouth every 8 (eight) hours as needed.      [name] ONDANSETRON HCL 8 MG PO TABS Oral Take by mouth every 8 (eight) hours as needed.      . OXYCODONE HCL 10 MG PO TB12 Oral Take 10 mg by mouth every 12 (twelve) hours.        There were no vitals taken for this visit.  Physical Exam  Constitutional: She is oriented to person, place, and time. No distress.       Thin cachectic.  HENT:  Head: Normocephalic and atraumatic.  Mouth/Throat: Oropharynx is clear and moist.  Neck: Normal range of motion. Neck supple.  Cardiovascular: Normal rate and regular rhythm.   Pulmonary/Chest: Effort normal and breath sounds normal.  Abdominal: Soft. She exhibits no distension. There is no  tenderness.  Musculoskeletal: Normal range of motion.  Neurological: She is alert and oriented to person, place, and time.  Skin: Skin is warm and dry.    ED Course  Procedures (including critical care time)  Labs Reviewed - No data to display No results found.   No diagnosis found.    MDM  I suspect this is narcotic withdrawal.  I offered to check other tests, however patient does not want to be stuck.  Will prescribe percocet, follow up prn.        Geoffery Lyons, MD 03/08/11 1054

## 2011-03-08 NOTE — ED Notes (Signed)
Upper scapular back pain

## 2011-03-08 NOTE — ED Notes (Signed)
Pt reports upper back pain with stopping Meds oxycodone on weds

## 2011-03-08 NOTE — ED Notes (Signed)
Care plan and safe use of meds reviewed 

## 2011-03-21 DIAGNOSIS — M549 Dorsalgia, unspecified: Secondary | ICD-10-CM | POA: Insufficient documentation

## 2011-03-22 ENCOUNTER — Encounter (HOSPITAL_COMMUNITY): Payer: Self-pay | Admitting: *Deleted

## 2011-03-22 ENCOUNTER — Emergency Department (HOSPITAL_COMMUNITY)
Admission: EM | Admit: 2011-03-22 | Discharge: 2011-03-22 | Discharge status: Against Medical Advice | Payer: Self-pay | Attending: Emergency Medicine | Admitting: Emergency Medicine

## 2011-03-22 NOTE — ED Notes (Signed)
Pt c/o upper back pain radiating to lower back;

## 2011-04-10 ENCOUNTER — Telehealth: Payer: Self-pay | Admitting: Hematology and Oncology

## 2011-04-10 NOTE — Telephone Encounter (Signed)
Pt already on schedule for ct @ [facility] 1/3. Added lb for 1/3 @ 8 am due to pt will arrive @ [facility] 8:30 am for water prep. Next available f/u w/LO is 1/31. Pt given lb appt for 1/3 @ 8 am + f/u for 1/31 @ 11:30 am.

## 2011-04-16 ENCOUNTER — Other Ambulatory Visit: Payer: Self-pay | Admitting: Hematology and Oncology

## 2011-04-16 ENCOUNTER — Ambulatory Visit (HOSPITAL_COMMUNITY)
Admission: RE | Admit: 2011-04-16 | Discharge: 2011-04-16 | Disposition: A | Discharge status: Home / Self Care | Payer: Medicaid Other | Source: Ambulatory Visit | Attending: Hematology and Oncology | Admitting: Hematology and Oncology

## 2011-04-16 ENCOUNTER — Other Ambulatory Visit (HOSPITAL_BASED_OUTPATIENT_CLINIC_OR_DEPARTMENT_OTHER): Payer: Self-pay | Admitting: Lab

## 2011-04-16 DIAGNOSIS — K7689 Other specified diseases of liver: Secondary | ICD-10-CM | POA: Insufficient documentation

## 2011-04-16 DIAGNOSIS — Z9049 Acquired absence of other specified parts of digestive tract: Secondary | ICD-10-CM | POA: Insufficient documentation

## 2011-04-16 DIAGNOSIS — Z932 Ileostomy status: Secondary | ICD-10-CM | POA: Insufficient documentation

## 2011-04-16 DIAGNOSIS — Z9221 Personal history of antineoplastic chemotherapy: Secondary | ICD-10-CM | POA: Insufficient documentation

## 2011-04-16 DIAGNOSIS — L989 Disorder of the skin and subcutaneous tissue, unspecified: Secondary | ICD-10-CM | POA: Insufficient documentation

## 2011-04-16 DIAGNOSIS — D739 Disease of spleen, unspecified: Secondary | ICD-10-CM | POA: Insufficient documentation

## 2011-04-16 DIAGNOSIS — C181 Malignant neoplasm of appendix: Secondary | ICD-10-CM

## 2011-04-16 DIAGNOSIS — C20 Malignant neoplasm of rectum: Secondary | ICD-10-CM

## 2011-04-16 DIAGNOSIS — Z923 Personal history of irradiation: Secondary | ICD-10-CM | POA: Insufficient documentation

## 2011-04-16 DIAGNOSIS — N134 Hydroureter: Secondary | ICD-10-CM | POA: Insufficient documentation

## 2011-04-16 LAB — CMP (CANCER CENTER ONLY)
Albumin: 3.1 g/dL — ABNORMAL LOW (ref 3.3–5.5)
Alkaline Phosphatase: 104 U/L — ABNORMAL HIGH (ref 26–84)
BUN, Bld: 13 mg/dL (ref 7–22)
CO2: 30 mEq/L (ref 18–33)
Calcium: 9.8 mg/dL (ref 8.0–10.3)
Chloride: 102 mEq/L (ref 98–108)
Glucose, Bld: 99 mg/dL (ref 73–118)
Potassium: 3.4 mEq/L (ref 3.3–4.7)
Sodium: 144 mEq/L (ref 128–145)
Total Protein: 7.6 g/dL (ref 6.4–8.1)

## 2011-04-16 LAB — CBC WITH DIFFERENTIAL/PLATELET
Basophils Absolute: 0 10*3/uL (ref 0.0–0.1)
Eosinophils Absolute: 0.1 10*3/uL (ref 0.0–0.5)
HGB: 12.1 g/dL (ref 11.6–15.9)
MCV: 88.7 fL (ref 79.5–101.0)
MONO#: 0.5 10*3/uL (ref 0.1–0.9)
MONO%: 13.6 % (ref 0.0–14.0)
NEUT#: 2 10*3/uL (ref 1.5–6.5)
RBC: 4.13 10*6/uL (ref 3.70–5.45)
RDW: 16.6 % — ABNORMAL HIGH (ref 11.2–14.5)
WBC: 3.7 10*3/uL — ABNORMAL LOW (ref 3.9–10.3)

## 2011-04-16 LAB — CEA: CEA: 1.5 ng/mL (ref 0.0–5.0)

## 2011-04-16 MED ORDER — IOHEXOL 300 MG/ML  SOLN
100.0000 mL | Freq: Once | INTRAMUSCULAR | Status: AC | PRN
  Administered 2011-04-16: 80 mL via INTRAVENOUS

## 2011-04-21 ENCOUNTER — Other Ambulatory Visit: Payer: Self-pay | Admitting: *Deleted

## 2011-04-22 ENCOUNTER — Telehealth: Payer: Self-pay | Admitting: Hematology and Oncology

## 2011-04-22 NOTE — Telephone Encounter (Signed)
lmonvm for pt re appt for 1/15 @ 9:45 am. appt added per 1/8 elec pof to be w/RJ 1/15.

## 2011-04-27 ENCOUNTER — Encounter: Payer: Self-pay | Admitting: *Deleted

## 2011-04-28 ENCOUNTER — Telehealth: Payer: Self-pay | Admitting: Hematology and Oncology

## 2011-04-28 ENCOUNTER — Ambulatory Visit (HOSPITAL_BASED_OUTPATIENT_CLINIC_OR_DEPARTMENT_OTHER): Payer: Self-pay | Admitting: Physician Assistant

## 2011-04-28 VITALS — BP 115/77 | HR 77 | Temp 97.1°F | Ht 62.0 in | Wt 90.2 lb

## 2011-04-28 DIAGNOSIS — C20 Malignant neoplasm of rectum: Secondary | ICD-10-CM

## 2011-04-28 DIAGNOSIS — R109 Unspecified abdominal pain: Secondary | ICD-10-CM

## 2011-04-28 DIAGNOSIS — Z9221 Personal history of antineoplastic chemotherapy: Secondary | ICD-10-CM

## 2011-04-28 NOTE — Progress Notes (Signed)
This office note has been dictated.

## 2011-04-28 NOTE — Telephone Encounter (Signed)
Gv pt appt for [id number].  scheduled pt for ct scan on 09/22/2011 @ [facility]

## 2011-04-28 NOTE — Progress Notes (Signed)
CC:   Herbie Saxon, MD Currie Paris, [name] Caryn Section, MD  IDENTIFYING STATEMENT:  Ms. 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  INTERIM HISTORY:  Ms. Furney reports since her last clinic visit in October 2012 she has had overall normal energy level.  No fevers, chills, or night sweats.  No dyspnea or cough.  She has improved appetite and states that she has been gaining weight.  She has had no issues with nausea or vomiting.  She does have occasional abdominal discomfort but feels this is well controlled.  She is not having any issues with constipation or diarrhea.  No difficulty urinating.  She states her next followup appointment with Dr. [name] at [facility] is in March and she will have repeat scans at that time.  She also just had Port-A-Cath flush when she had her CT scan on April 16, 2011.  CURRENT MEDICATIONS:  Reviewed and recorded.  PHYSICAL EXAMINATION:  Vital Signs:  Temperature was 97.1, heart rate 77, respirations 20, blood pressure 115/77, weight 90.2 pounds. General:  This is a thin white female in no acute distress.  HEENT: Sclerae are nonicteric.  There is no oral thrush or mucositis.  Skin: No rashes or lesions.  Lymph:  No cervical, supraclavicular, axillary, or inguinal lymphadenopathy.  Cardiac:  Regular rate and rhythm without murmurs or gallops.  Peripheral pulses are 2+.  There is a right subclavian Port-A-Cath without signs of infection.  Chest:  Lungs clear to auscultation.  Abdomen:  Positive bowel sounds, soft, nontender, nondistended, no organomegaly.  There is a right-sided ostomy with no stool present in drainage bag.  Extremities:  Without edema, cyanosis, or calf tenderness.  Neuro:  Alert and oriented x3.  Strength, sensation, and coordination all grossly intact.  RADIOGRAPHIC STUDIES:  CT of the chest,  abdomen, and pelvis with contrast from April 16, 2011:  In the chest, no evidence of thoracic metastasis.  Abdomen and pelvis:  Apparent communication between a presacral gas collection and the vaginal cuff.  The gas collection has increased in size compared to prior but there is no longer fluid within the collection as seen on comparison CT.  Right hydronephrosis and hydroureter are stable compared to prior.  Stable hepatic lesions appear benign.  Right lower quadrant ileostomy without evidence of obstruction and no evidence of metastasis.  LABS:  Laboratory data from April 16, 2011:  CBC with diff reveals white blood cell count of 3.7, hemoglobin 12.1, hematocrit 36.7, platelet count 356.  ANC of 2.0 and MCV of 88.7.  Chemistries reveal a sodium of 144, potassium 3.4, chloride 102, BUN 13, creatinine 0.5, glucose of 99, bilirubin 0.4, alkaline phosphatase 104, AST 16, ALT 15, total protein 7.6, albumin 3.1, and calcium 9.8.  Tumor marker CEA of 1.5.  IMPRESSION/PLAN: 1. 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  She     received pelvic external radiation therapy with continuous infusion  5-FU between June 27th and November 10, 2009, followed by adjuvant     FOLFOX between October 22, 2009, with Avastin.  However, Avastin     discontinued with her 9th cycle due to rectal bleeding.  She     completed her 10th cycle on May 14, 2010.  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 Herbie Saxon at [facility]  She is due for repeat followup     appointment with Dr. [name] in March 2013 at which time she states     she will also have restaging CT scans. 2. The patient last had Port-A-Cath flush on April 16, 2011, and will     continue to maintain port flushes q.6 weeks. 3. Per [name], the patient will be scheduled for followup     appointment in June 2013.  A few days before this, we will reassess     CBC with diff, CMET, CEA, and the patient will have Port-A-Cath     flushed.  She will also have CT of the chest, abdomen, and pelvis     with contrast.  The patient was advised to call in the interim if     any questions or problems. 4. Of note, the patient's above labs and radiographic studies have     been reviewed by [name] and were reviewed with the patient     today.    ______________________________ Michail Sermon, NP RH/MEDQ  D:  04/28/2011  T:  04/28/2011  Job:  [id number]

## 2011-04-28 NOTE — Telephone Encounter (Signed)
pt left after new orders for port flush in [id number].  mailed appt to her home

## 2011-04-30 ENCOUNTER — Emergency Department (HOSPITAL_COMMUNITY)
Admission: EM | Admit: 2011-04-30 | Discharge: 2011-04-30 | Disposition: A | Discharge status: Home / Self Care | Payer: Medicaid Other | Attending: Emergency Medicine | Admitting: Emergency Medicine

## 2011-04-30 ENCOUNTER — Emergency Department (HOSPITAL_COMMUNITY): Payer: Medicaid Other

## 2011-04-30 ENCOUNTER — Encounter (HOSPITAL_COMMUNITY): Payer: Self-pay | Admitting: Emergency Medicine

## 2011-04-30 DIAGNOSIS — M542 Cervicalgia: Secondary | ICD-10-CM | POA: Insufficient documentation

## 2011-04-30 DIAGNOSIS — Z85038 Personal history of other malignant neoplasm of large intestine: Secondary | ICD-10-CM | POA: Insufficient documentation

## 2011-04-30 DIAGNOSIS — M549 Dorsalgia, unspecified: Secondary | ICD-10-CM | POA: Insufficient documentation

## 2011-04-30 DIAGNOSIS — Z79899 Other long term (current) drug therapy: Secondary | ICD-10-CM | POA: Insufficient documentation

## 2011-04-30 DIAGNOSIS — Z85048 Personal history of other malignant neoplasm of rectum, rectosigmoid junction, and anus: Secondary | ICD-10-CM | POA: Insufficient documentation

## 2011-04-30 MED ORDER — HYDROMORPHONE HCL PF 1 MG/ML IJ SOLN
1.0000 mg | Freq: Once | INTRAMUSCULAR | Status: AC
  Administered 2011-04-30: 1 mg via INTRAMUSCULAR
  Filled 2011-04-30: qty 1

## 2011-04-30 MED ORDER — HYDROCODONE-ACETAMINOPHEN 5-325 MG PO TABS: 1.0000 | ORAL_TABLET | ORAL | Status: AC | PRN

## 2011-04-30 MED ORDER — DIAZEPAM 5 MG PO TABS: 5.0000 mg | ORAL_TABLET | Freq: Three times a day (TID) | ORAL | Status: AC | PRN

## 2011-04-30 NOTE — ED Notes (Signed)
Rx given x2 D/c instructions reviewed w/ pt and family - pt and family deny any further questions or concerns at present.  

## 2011-04-30 NOTE — ED Notes (Signed)
Pt alert, nad, c/o mid back pain, chronic in nature, hx of rectal cancer, denies trauma or injury, denies changes in bowel or bladder, resp even unlabored skin pwd

## 2011-04-30 NOTE — ED Notes (Signed)
Pt reports mid/upper back pain progressively worse for the past few days - pt w/ hx of colon CA and hx of back pain - pt states she had been on vicodin while receiving aggressive chemotherapy. Pt has not received chemotherapy for approx 15yr. Pt has seen her oncologist in the past few days and was clear of CA at present. Pt in no acute distress.

## 2011-04-30 NOTE — ED Notes (Signed)
Patient transported to X-ray 

## 2011-05-01 NOTE — ED Provider Notes (Signed)
History     CSN: [id number]  Arrival date & time 04/30/11  1919   First MD Initiated Contact with Patient 04/30/11 2017      Chief Complaint  Patient presents with  . Back Pain    (Consider location/radiation/quality/duration/timing/severity/associated sxs/prior treatment) Patient is a 41 y.o. female presenting with back pain. The history is provided by the patient.  Back Pain  This is a chronic problem.  Pt with chronic back pain since intensive surgery for rectal cancer. Pain to entire back. Last chemo 11 months ago. Saw oncology 2 days ago and was given results of chest/abd/pelvic CT scans that indicated to mets to spine in those areas. Reports she has been taking less narcotic than in the past and feels her pain may be secondary to this. No new injury. Pain sharp and twisting, worse with trunk rotation, flexion, extension. Constant, moderate in severity, non-radiating. Denies any fever, new numbness or weakness, perianal numbness, or bowel/bladder incontinence. No tx today for her pain.  Past Medical History  Diagnosis Date  . Colorectal cancer     colo-rectal ca   . Primary appendiceal adenocarcinoma     Past Surgical History  Procedure Date  . Abdominal surgery   . Ostomy take down   . Rectoperitoneal fistula closure     No family history on file.  History  Substance Use Topics  . Smoking status: Never Smoker   . Smokeless tobacco: Not on file  . Alcohol Use: No     Review of Systems  Musculoskeletal: Positive for back pain.  10 systems reviewed and are negative for acute change except as noted in the HPI.   Allergies  Compazine  Home Medications   Current Outpatient Rx  Name Route Sig Dispense Refill  . HYDROCODONE-ACETAMINOPHEN 5-500 MG PO TABS Oral Take 1 tablet by mouth every 6 (six) hours as needed.    [name] DIAZEPAM 5 MG PO TABS Oral Take 1 tablet (5 mg total) by mouth every 8 (eight) hours as needed (for muscle spasm). 12 tablet 0  .  HYDROCODONE-ACETAMINOPHEN 5-325 MG PO TABS Oral Take 1-2 tablets by mouth every 4 (four) hours as needed for pain. 15 tablet 0    BP 118/72  Pulse 83  Temp(Src) 98.7 F (37.1 C) (Oral)  Resp 18  Wt 90 lb (40.824 kg)  SpO2 100%  Physical Exam  Nursing note and vitals reviewed. Constitutional: She is oriented to person, place, and time. She appears well-developed and well-nourished.       Uncomfortable appearing  HENT:  Head: Normocephalic and atraumatic.  Right Ear: External ear normal.  Left Ear: External ear normal.  Eyes: Pupils are equal, round, and reactive to light.  Neck: Normal range of motion. Neck supple.  Cardiovascular: Normal rate, regular rhythm, normal heart sounds and intact distal pulses.   Pulmonary/Chest: Effort normal and breath sounds normal. No respiratory distress. She has no wheezes. She exhibits no tenderness.  Abdominal: Soft. Bowel sounds are normal. She exhibits no distension. There is no tenderness.  Musculoskeletal: Normal range of motion. She exhibits no edema.       Tenderness to palpation of entire spine and all muscles of the back.  Lymphadenopathy:    She has no cervical adenopathy.  Neurological: She is alert and oriented to person, place, and time. She displays normal reflexes. No cranial nerve deficit. Coordination normal.       Gait steady without ataxia.  Skin: Skin is warm and dry. No rash noted.  No erythema.    ED Course  Procedures (including critical care time)  Labs Reviewed - No data to display Dg Cervical Spine Complete  04/30/2011  *RADIOLOGY REPORT*  Clinical Data: Neck pain.  CERVICAL SPINE - 4+ VIEWS  Comparison:  None.  Findings:  There is no evidence of cervical spine fracture or prevertebral soft tissue swelling.  Alignment is normal.  No other significant bone abnormalities are identified.  IMPRESSION: Negative cervical spine radiographs.  Original Report Authenticated By: Reola Calkins, [name]     1. Back pain        MDM  Chronic back pain, no new injury. Recent CT scans with no mets to any areas of chest/abd/pelvis. Imaging of c-spine ordered, no lesions seen on x-ray. Pain controlled in ED. Pt requests no new narcotic rx but has run out of her hydrocodone, would like to try a muscle relaxer.        [address] [place], [place] 05/01/11 [phone]

## 2011-05-04 NOTE — ED Provider Notes (Signed)
Medical screening examination/treatment/procedure(s) were performed by non-physician practitioner and as supervising physician I was immediately available for consultation/collaboration.  Caden Fatica R. Chinyere Galiano, MD 05/04/11 1452 

## 2011-05-09 ENCOUNTER — Encounter (HOSPITAL_BASED_OUTPATIENT_CLINIC_OR_DEPARTMENT_OTHER): Payer: Self-pay | Admitting: *Deleted

## 2011-05-09 DIAGNOSIS — Z79899 Other long term (current) drug therapy: Secondary | ICD-10-CM | POA: Insufficient documentation

## 2011-05-09 DIAGNOSIS — M62838 Other muscle spasm: Secondary | ICD-10-CM | POA: Insufficient documentation

## 2011-05-09 NOTE — ED Notes (Signed)
Pt states she has had mid-lower back pain x 1 year. Bilat ear pain and cold s/s since yesterday.

## 2011-05-10 ENCOUNTER — Emergency Department (HOSPITAL_BASED_OUTPATIENT_CLINIC_OR_DEPARTMENT_OTHER)
Admission: EM | Admit: 2011-05-10 | Discharge: 2011-05-10 | Disposition: A | Discharge status: Home / Self Care | Payer: Medicaid Other | Attending: Emergency Medicine | Admitting: Emergency Medicine

## 2011-05-10 DIAGNOSIS — M62838 Other muscle spasm: Secondary | ICD-10-CM

## 2011-05-10 MED ORDER — METHOCARBAMOL 500 MG PO TABS: 500.0000 mg | ORAL_TABLET | Freq: Two times a day (BID) | ORAL | Status: DC | PRN

## 2011-05-10 MED ORDER — METHOCARBAMOL 500 MG PO TABS: 500.0000 mg | ORAL_TABLET | Freq: Two times a day (BID) | ORAL | Status: AC | PRN

## 2011-05-10 MED ORDER — KETOROLAC TROMETHAMINE 60 MG/2ML IM SOLN
60.0000 mg | Freq: Once | INTRAMUSCULAR | Status: AC
  Administered 2011-05-10: 60 mg via INTRAMUSCULAR
  Filled 2011-05-10: qty 2

## 2011-05-10 MED ORDER — METHOCARBAMOL 500 MG PO TABS
500.0000 mg | ORAL_TABLET | Freq: Once | ORAL | Status: AC
  Administered 2011-05-10: 500 mg via ORAL
  Filled 2011-05-10: qty 1

## 2011-05-10 MED ORDER — NAPROXEN 500 MG PO TABS: 500.0000 mg | ORAL_TABLET | Freq: Two times a day (BID) | ORAL | Status: DC

## 2011-05-10 MED ORDER — AZITHROMYCIN 250 MG PO TABS: 250.0000 mg | ORAL_TABLET | Freq: Every day | ORAL | Status: AC

## 2011-05-10 NOTE — ED Notes (Signed)
MD at bedside. 

## 2011-05-10 NOTE — ED Provider Notes (Signed)
History     CSN: [id number]  Arrival date & time 05/09/11  2256   First MD Initiated Contact with Patient 05/10/11 0136      Chief Complaint  Patient presents with  . Back Pain    (Consider location/radiation/quality/duration/timing/severity/associated sxs/prior treatment) HPI Comments: Patient with a history of rectal cancer status post surgery, chemotherapy, radiation. She presents with the complaint of chronic back pain in the upper left back. This is overall a daily problem for her, sometimes is worse than others and today it is particularly bad. It is worse with movement of her shoulder and palpation of her mid upper back, left under the shoulder blade.  Symptoms are persistent, fluctuating in intensity and are not associated with chest pain, dysuria, diarrhea. She has had Valium prior to arrival other with no improvement. This is a chronic medication for her. In addition she has been having a cough with copious amounts of nasal drainage and phlegm, the cough is productive of yellow sputum. This going on for several days she has bilateral ear pain with this.  Patient is a 41 y.o. female presenting with back pain. The history is provided by the patient and medical records.  Back Pain     Past Medical History  Diagnosis Date  . Colorectal cancer     colo-rectal ca   . Primary appendiceal adenocarcinoma     Past Surgical History  Procedure Date  . Abdominal surgery   . Ostomy take down   . Rectoperitoneal fistula closure     No family history on file.  History  Substance Use Topics  . Smoking status: Never Smoker   . Smokeless tobacco: Not on file  . Alcohol Use: No    OB History    Grav Para Term Preterm Abortions TAB SAB Ect Mult Living                  Review of Systems  Musculoskeletal: Positive for back pain.  All other systems reviewed and are negative.    Allergies  Compazine  Home Medications   Current Outpatient Rx  Name Route Sig Dispense  Refill  . ACETAMINOPHEN 325 MG PO TABS Oral Take 650 mg by mouth once as needed. For headache    . DIAZEPAM 5 MG PO TABS Oral Take 1 tablet (5 mg total) by mouth every 8 (eight) hours as needed (for muscle spasm). 12 tablet 0  . HYDROCODONE-ACETAMINOPHEN 5-325 MG PO TABS Oral Take 1-2 tablets by mouth every 4 (four) hours as needed for pain. 15 tablet 0  . PSEUDOEPH-DOXYLAMINE-DM-APAP 60-12.09-09-998 MG/30ML PO LIQD Oral Take 30 mLs by mouth once as needed. For cold symptoms    . AZITHROMYCIN 250 MG PO TABS Oral Take 1 tablet (250 mg total) by mouth daily. 500mg  PO day 1, then 250mg  PO days 205 6 tablet 0  . METHOCARBAMOL 500 MG PO TABS Oral Take 1 tablet (500 mg total) by mouth 2 (two) times daily as needed. 20 tablet 0  . NAPROXEN 500 MG PO TABS Oral Take 1 tablet (500 mg total) by mouth 2 (two) times daily with a meal. 30 tablet 0    BP 95/59  Pulse 78  Temp(Src) 97.9 F (36.6 C) (Oral)  Resp 18  Ht 5' 2.5" (1.588 m)  Wt 90 lb (40.824 kg)  BMI 16.20 kg/m2  SpO2 100%  Physical Exam  Nursing note and vitals reviewed. Constitutional: She appears well-developed and well-nourished. No distress.  HENT:  Head: Normocephalic and  atraumatic.  Mouth/Throat: Oropharynx is clear and moist. No oropharyngeal exudate.       Mucous memory is moist, nasal drainage present bilaterally, tympanic membranes clear bilaterally  Eyes: Conjunctivae and EOM are normal. Pupils are equal, round, and reactive to light. Right eye exhibits no discharge. Left eye exhibits no discharge. No scleral icterus.  Neck: Normal range of motion. Neck supple. No JVD present. No thyromegaly present.  Cardiovascular: Normal rate, regular rhythm, normal heart sounds and intact distal pulses.  Exam reveals no gallop and no friction rub.   No murmur heard. Pulmonary/Chest: Effort normal and breath sounds normal. No respiratory distress. She has no wheezes. She has no rales.  Abdominal: Soft. Bowel sounds are normal. She exhibits  no distension and no mass. There is no tenderness.  Musculoskeletal: Normal range of motion. She exhibits tenderness ( Tender to palpation in the left rhomboid muscle. Tenderness with range of motion of the shoulder and stretching as muscle.). She exhibits no edema.       No tenderness over the spinal bones of the lumbar, sacral, thoracic, cervical spines  Lymphadenopathy:    She has no cervical adenopathy.  Neurological: She is alert. Coordination normal.  Skin: Skin is warm and dry. No rash noted. No erythema.  Psychiatric: She has a normal mood and affect. Her behavior is normal.    ED Course  Procedures (including critical care time)  Labs Reviewed - No data to display No results found.   1. Muscle spasm       MDM  Patient has muscle spasm in the left rhomboid area. She also has a likely upper respiratory infection with possible sinusitis. We'll treat for same, prescriptions as below. Intramuscular Toradol and oral Robaxin given prior to discharge  Discharge prescriptions  #1 Robaxin #2 Naprosyn #3 Zithromax        Vida Roller, MD 05/10/11 [phone]

## 2011-05-11 ENCOUNTER — Other Ambulatory Visit: Payer: Self-pay

## 2011-05-14 ENCOUNTER — Ambulatory Visit: Payer: Self-pay | Admitting: Hematology and Oncology

## 2011-05-22 ENCOUNTER — Emergency Department (HOSPITAL_COMMUNITY)
Admission: EM | Admit: 2011-05-22 | Discharge: 2011-05-23 | Disposition: A | Discharge status: Home / Self Care | Payer: Medicaid Other | Attending: Emergency Medicine | Admitting: Emergency Medicine

## 2011-05-22 ENCOUNTER — Encounter (HOSPITAL_COMMUNITY): Payer: Self-pay | Admitting: *Deleted

## 2011-05-22 DIAGNOSIS — M62830 Muscle spasm of back: Secondary | ICD-10-CM

## 2011-05-22 DIAGNOSIS — M5380 Other specified dorsopathies, site unspecified: Secondary | ICD-10-CM | POA: Insufficient documentation

## 2011-05-22 DIAGNOSIS — M546 Pain in thoracic spine: Secondary | ICD-10-CM | POA: Insufficient documentation

## 2011-05-22 MED ORDER — HYDROMORPHONE HCL PF 1 MG/ML IJ SOLN
1.0000 mg | Freq: Once | INTRAMUSCULAR | Status: AC
  Administered 2011-05-23: 1 mg via INTRAMUSCULAR
  Filled 2011-05-22: qty 1

## 2011-05-22 NOTE — ED Notes (Signed)
Pt alert, nad, c/o upper back pain, chronic in nature, denies truama or injury, ambulates to room, steady gait noted

## 2011-05-22 NOTE — ED Notes (Signed)
Pt in c/o back pain, states she has had back pain for awhile and has been seen for same, states that pain medication given is not helping

## 2011-05-23 MED ORDER — HYDROCODONE-ACETAMINOPHEN 5-325 MG PO TABS: 2.0000 | ORAL_TABLET | ORAL | Status: DC | PRN

## 2011-05-23 NOTE — ED Provider Notes (Signed)
History     CSN: [id number]  Arrival date & time 05/22/11  2256   First MD Initiated Contact with Patient 05/22/11 2312      Chief Complaint  Patient presents with  . Back Pain    (Consider location/radiation/quality/duration/timing/severity/associated sxs/prior treatment) HPI Comments: Patient with a history of chronic upper back pain - specifically left rhomboid pain - states has been on many different medications, states the only one that seems to work is the hydrocodone - denies weakness, numbness, loss of control of bowels or bladder.  Patient is a 41 y.o. female presenting with back pain. The history is provided by the patient. No language interpreter was used.  Back Pain  This is a recurrent problem. The current episode started 3 to 5 hours ago. The problem occurs constantly. The problem has not changed since onset.The pain is associated with no known injury. The pain is present in the thoracic spine. The quality of the pain is described as stabbing. The pain does not radiate. The pain is at a severity of 6/10. The pain is moderate. The symptoms are aggravated by bending, twisting and certain positions. The pain is the same all the time. Stiffness is present all day. Pertinent negatives include no chest pain, no fever, no numbness, no weight loss, no headaches, no abdominal pain, no abdominal swelling, no bowel incontinence, no perianal numbness, no bladder incontinence, no dysuria, no pelvic pain, no leg pain, no paresthesias, no paresis, no tingling and no weakness. She has tried NSAIDs for the symptoms. The treatment provided no relief.    Past Medical History  Diagnosis Date  . Colorectal cancer     colo-rectal ca   . Primary appendiceal adenocarcinoma     Past Surgical History  Procedure Date  . Abdominal surgery   . Ostomy take down   . Rectoperitoneal fistula closure     History reviewed. No pertinent family history.  History  Substance Use Topics  . Smoking  status: Never Smoker   . Smokeless tobacco: Not on file  . Alcohol Use: No    OB History    Grav Para Term Preterm Abortions TAB SAB Ect Mult Living                  Review of Systems  Constitutional: Negative for fever and weight loss.  Cardiovascular: Negative for chest pain.  Gastrointestinal: Negative for abdominal pain and bowel incontinence.  Genitourinary: Negative for bladder incontinence, dysuria and pelvic pain.  Musculoskeletal: Positive for back pain.  Neurological: Negative for tingling, weakness, numbness, headaches and paresthesias.  All other systems reviewed and are negative.    Allergies  Compazine  Home Medications   Current Outpatient Rx  Name Route Sig Dispense Refill  . ACETAMINOPHEN 325 MG PO TABS Oral Take 650 mg by mouth once as needed. For headache    . DIAZEPAM 5 MG PO TABS Oral Take 5 mg by mouth every 6 (six) hours as needed. Anxiety    . METHOCARBAMOL 500 MG PO TABS Oral Take 500 mg by mouth 4 (four) times daily.    [name] NAPROXEN 500 MG PO TABS Oral Take 1 tablet (500 mg total) by mouth 2 (two) times daily with a meal. 30 tablet 0    BP 96/56  Pulse 98  Temp(Src) 98 F (36.7 C) (Oral)  Resp 20  SpO2 100%  Physical Exam  Nursing note and vitals reviewed. Constitutional: She is oriented to person, place, and time. She appears well-developed and  well-nourished. No distress.  HENT:  Head: Normocephalic and atraumatic.  Right Ear: External ear normal.  Left Ear: External ear normal.  Nose: Nose normal.  Mouth/Throat: Oropharynx is clear and moist. No oropharyngeal exudate.  Eyes: Conjunctivae are normal. Pupils are equal, round, and reactive to light. No scleral icterus.  Neck: Normal range of motion. Neck supple.  Cardiovascular: Normal rate, regular rhythm and normal heart sounds.  Exam reveals no gallop and no friction rub.   No murmur heard. Pulmonary/Chest: Effort normal and breath sounds normal. No respiratory distress. She exhibits  no tenderness.  Abdominal: Soft. Bowel sounds are normal. She exhibits no distension. There is no tenderness.  Musculoskeletal:       Thoracic back: She exhibits tenderness. She exhibits normal range of motion, no bony tenderness, no swelling and no edema.       Back:  Lymphadenopathy:    She has no cervical adenopathy.  Neurological: She is alert and oriented to person, place, and time. No cranial nerve deficit. She exhibits normal muscle tone. Coordination normal.       Bilateral UE 5/5 strength  Skin: Skin is warm and dry. No rash noted. No erythema. No pallor.  Psychiatric: She has a normal mood and affect. Her behavior is normal. Judgment and thought content normal.    ED Course  Procedures (including critical care time)  Labs Reviewed - No data to display No results found.   Chronic upper back pain   MDM  Patient with long history of chronic mid back pain - states she has had evaluations and this is not metatasis of cancer but they believe it to be more MSK back pain - denies any change in the pain and there are no alarming signs for cauda equina or epidural abscess.  Will give short course of hydrocodone.        Izola Price [place], [place] 05/23/11 [phone]

## 2011-05-24 NOTE — ED Provider Notes (Signed)
Medical screening examination/treatment/procedure(s) were performed by non-physician practitioner and as supervising physician I was immediately available for consultation/collaboration.  Larrissa Stivers T Aric Jost, MD 05/24/11 0238 

## 2011-05-29 ENCOUNTER — Emergency Department (HOSPITAL_BASED_OUTPATIENT_CLINIC_OR_DEPARTMENT_OTHER)
Admission: EM | Admit: 2011-05-29 | Discharge: 2011-05-29 | Disposition: A | Discharge status: Home / Self Care | Payer: Medicaid Other | Attending: Emergency Medicine | Admitting: Emergency Medicine

## 2011-05-29 ENCOUNTER — Encounter (HOSPITAL_BASED_OUTPATIENT_CLINIC_OR_DEPARTMENT_OTHER): Payer: Self-pay | Admitting: *Deleted

## 2011-05-29 DIAGNOSIS — IMO0001 Reserved for inherently not codable concepts without codable children: Secondary | ICD-10-CM | POA: Insufficient documentation

## 2011-05-29 DIAGNOSIS — Z85038 Personal history of other malignant neoplasm of large intestine: Secondary | ICD-10-CM | POA: Insufficient documentation

## 2011-05-29 DIAGNOSIS — R109 Unspecified abdominal pain: Secondary | ICD-10-CM | POA: Insufficient documentation

## 2011-05-29 DIAGNOSIS — Z79899 Other long term (current) drug therapy: Secondary | ICD-10-CM | POA: Insufficient documentation

## 2011-05-29 DIAGNOSIS — M791 Myalgia, unspecified site: Secondary | ICD-10-CM

## 2011-05-29 DIAGNOSIS — N390 Urinary tract infection, site not specified: Secondary | ICD-10-CM | POA: Insufficient documentation

## 2011-05-29 LAB — URINE MICROSCOPIC-ADD ON

## 2011-05-29 LAB — URINALYSIS, ROUTINE W REFLEX MICROSCOPIC
Glucose, UA: NEGATIVE mg/dL
Protein, ur: 100 mg/dL — AB
pH: 6 (ref 5.0–8.0)

## 2011-05-29 MED ORDER — OXYCODONE-ACETAMINOPHEN 5-325 MG PO TABS
2.0000 | ORAL_TABLET | Freq: Once | ORAL | Status: AC
  Administered 2011-05-29: 2 via ORAL
  Filled 2011-05-29: qty 2

## 2011-05-29 MED ORDER — HYDROCODONE-ACETAMINOPHEN 5-325 MG PO TABS: 2.0000 | ORAL_TABLET | Freq: Three times a day (TID) | ORAL | Status: AC | PRN

## 2011-05-29 MED ORDER — DIAZEPAM 5 MG PO TABS: 5.0000 mg | ORAL_TABLET | Freq: Every evening | ORAL | Status: AC | PRN

## 2011-05-29 MED ORDER — CIPROFLOXACIN HCL 500 MG PO TABS: 500.0000 mg | ORAL_TABLET | Freq: Two times a day (BID) | ORAL | Status: AC

## 2011-05-29 NOTE — ED Notes (Signed)
Pt seen by [facility], and diagnosed with a UTI a couple days ago. Pt was seen for low back pain that she had for "weeks". Pt was told she had "inflammation around my kidney". Denies urinary symptoms. Denies fevers. Pt reports she is here tonight for worsening pain "and i feel like the antibiotics aren't touching it"

## 2011-05-29 NOTE — ED Provider Notes (Signed)
History     CSN: [id number]  Arrival date & time 05/29/11  0202   First MD Initiated Contact with Patient 05/29/11 [phone]      Chief Complaint  Patient presents with  . Abdominal Pain   This female with a notable history of rectal cancer, with ostomy now presents with abdominal and back pain.  Notably, the patient has a history of chronic back pain as well as persistent discomfort in her abdomen.  She notes that her pain began during therapy, one year ago, has become worse over the past few days.  The pain in her back is described as throbbing, sore, focally in her left upper back.  The abdominal pain is diffuse, greater in the lower abdomen.  She notes that she had emesis 3 or 4 days ago, but has had not no vomiting today or yesterday.  She denies any dysuria vaginal bleeding, or discharge.  She denies any fevers, chills.  She does endorse anorexia.  No relief with ibuprofen.  No clear exacerbating factors. Notably, the patient was at [facility] within the past 24 hours where she had CT scan of her chest, abdomen, pelvis.  She was diagnosed with urinary tract infection, informed that there were no notable findings on her CAT scan beyond peri-nephric inflammation. HPI  Past Medical History  Diagnosis Date  . Colorectal cancer     colo-rectal ca   . Primary appendiceal adenocarcinoma     Past Surgical History  Procedure Date  . Abdominal surgery   . Ostomy take down   . Rectoperitoneal fistula closure     No family history on file.  History  Substance Use Topics  . Smoking status: Never Smoker   . Smokeless tobacco: Not on file  . Alcohol Use: No    OB History    Grav Para Term Preterm Abortions TAB SAB Ect Mult Living                  Review of Systems  Constitutional:       HPI  HENT:       HPI otherwise negative  Eyes: Negative.   Respiratory:       HPI, otherwise negative  Cardiovascular:       HPI, otherwise nmegative  Gastrointestinal: Positive for  vomiting.  Genitourinary:       HPI, otherwise negative  Musculoskeletal:       HPI, otherwise negative  Skin: Negative.   Neurological: Negative for syncope.    Allergies  Compazine  Home Medications   Current Outpatient Rx  Name Route Sig Dispense Refill  . CEFPODOXIME PROXETIL 200 MG PO TABS Oral Take 200 mg by mouth 2 (two) times daily.    [name] NAPROXEN 500 MG PO TABS Oral Take 1 tablet (500 mg total) by mouth 2 (two) times daily with a meal. 30 tablet 0  . ACETAMINOPHEN 325 MG PO TABS Oral Take 650 mg by mouth once as needed. For headache    . DIAZEPAM 5 MG PO TABS Oral Take 5 mg by mouth every 6 (six) hours as needed. Anxiety    . HYDROCODONE-ACETAMINOPHEN 5-325 MG PO TABS Oral Take 2 tablets by mouth every 4 (four) hours as needed for pain. 20 tablet 0  . METHOCARBAMOL 500 MG PO TABS Oral Take 500 mg by mouth 4 (four) times daily.      BP 107/60  Pulse 93  Temp(Src) 97.6 F (36.4 C) (Oral)  Resp 18  Ht 5\' 2"  (  1.575 m)  Wt 92 lb (41.731 kg)  BMI 16.83 kg/m2  SpO2 100%  Physical Exam  Nursing note and vitals reviewed. Constitutional: She is oriented to person, place, and time. She appears well-developed and well-nourished. No distress.  HENT:  Head: Normocephalic and atraumatic.  Eyes: Conjunctivae and EOM are normal.  Cardiovascular: Normal rate and regular rhythm.   Pulmonary/Chest: Effort normal and breath sounds normal. No stridor. No respiratory distress.  Abdominal:       Ostomy in place in the lower abdomen.  Abdomen is soft, with mild discomfort diffusely, no guarding, no rebound tenderness, no peritoneal findings  Musculoskeletal: She exhibits no edema.       Arms: Neurological: She is alert and oriented to person, place, and time. No cranial nerve deficit.  Skin: Skin is warm and dry.  Psychiatric: She has a normal mood and affect.    ED Course  Procedures (including critical care time)  Labs Reviewed  URINALYSIS, ROUTINE W REFLEX MICROSCOPIC -  Abnormal; Notable for the following:    APPearance CLOUDY (*)    Specific Gravity, Urine 1.045 (*)    Hgb urine dipstick SMALL (*)    Ketones, ur 15 (*)    Protein, ur 100 (*)    Leukocytes, UA LARGE (*)    All other components within normal limits  URINE MICROSCOPIC-ADD ON - Abnormal; Notable for the following:    Squamous Epithelial / LPF MANY (*)    Bacteria, UA MANY (*)    All other components within normal limits   No results found.   No diagnosis found.    MDM  This 41 year old female with a notable history of rectal cancer, now with an ostomy presents with worsening of her somewhat typical pain.  On exam the patient is a distress with unremarkable vital signs.  The patient's abdomen is soft.  There is drainage in her ostomy.  Given the patient's endorsement of a negative CAT scan within the past 24 hours, she will not be scanned again.  The patient's urine demonstrates continued urinary tract infection.  The patient has recent history of antibiotics, but notes that this is a new antibiotic for her.  She will be switched to an antibiotic she has been sensitive to in the past.  The patient also requests refill of her medications, Valium and Norco.  This will be provided as well.  Patient has oncology follow up will be discharged to follow up as scheduled.       Gerhard Munch, MD 05/29/11 [phone]

## 2011-05-29 NOTE — ED Notes (Signed)
Pt is currently in remission from rectal cancer, and states that they performed a CT chest/abdomen and back two nights ago "and they looked for mets and other causes of the pain and said they couldn't find anything"

## 2011-07-21 ENCOUNTER — Emergency Department (INDEPENDENT_AMBULATORY_CARE_PROVIDER_SITE_OTHER): Payer: Medicaid Other

## 2011-07-21 ENCOUNTER — Encounter (HOSPITAL_BASED_OUTPATIENT_CLINIC_OR_DEPARTMENT_OTHER): Payer: Self-pay

## 2011-07-21 ENCOUNTER — Emergency Department (HOSPITAL_BASED_OUTPATIENT_CLINIC_OR_DEPARTMENT_OTHER)
Admission: EM | Admit: 2011-07-21 | Discharge: 2011-07-21 | Disposition: A | Discharge status: Home / Self Care | Payer: Medicaid Other | Attending: Emergency Medicine | Admitting: Emergency Medicine

## 2011-07-21 DIAGNOSIS — N390 Urinary tract infection, site not specified: Secondary | ICD-10-CM | POA: Insufficient documentation

## 2011-07-21 DIAGNOSIS — Z85048 Personal history of other malignant neoplasm of rectum, rectosigmoid junction, and anus: Secondary | ICD-10-CM

## 2011-07-21 DIAGNOSIS — R109 Unspecified abdominal pain: Secondary | ICD-10-CM | POA: Insufficient documentation

## 2011-07-21 DIAGNOSIS — Z79899 Other long term (current) drug therapy: Secondary | ICD-10-CM | POA: Insufficient documentation

## 2011-07-21 DIAGNOSIS — Z939 Artificial opening status, unspecified: Secondary | ICD-10-CM

## 2011-07-21 LAB — URINALYSIS, ROUTINE W REFLEX MICROSCOPIC
Ketones, ur: NEGATIVE mg/dL
Nitrite: NEGATIVE
Protein, ur: 30 mg/dL — AB
Urobilinogen, UA: 0.2 mg/dL (ref 0.0–1.0)
pH: 6.5 (ref 5.0–8.0)

## 2011-07-21 LAB — DIFFERENTIAL
Basophils Relative: 0 % (ref 0–1)
Eosinophils Absolute: 0.2 10*3/uL (ref 0.0–0.7)
Lymphs Abs: 1.4 10*3/uL (ref 0.7–4.0)
Monocytes Absolute: 0.8 10*3/uL (ref 0.1–1.0)
Monocytes Relative: 12 % (ref 3–12)

## 2011-07-21 LAB — CBC
HCT: 31.5 % — ABNORMAL LOW (ref 36.0–46.0)
Hemoglobin: 9.9 g/dL — ABNORMAL LOW (ref 12.0–15.0)
MCH: 27.6 pg (ref 26.0–34.0)
MCHC: 31.4 g/dL (ref 30.0–36.0)
MCV: 87.7 fL (ref 78.0–100.0)
RBC: 3.59 MIL/uL — ABNORMAL LOW (ref 3.87–5.11)

## 2011-07-21 LAB — BASIC METABOLIC PANEL
BUN: 13 mg/dL (ref 6–23)
Creatinine, Ser: 0.7 mg/dL (ref 0.50–1.10)
GFR calc Af Amer: >90 mL/min (ref >90)
GFR calc non Af Amer: >90 mL/min (ref >90)
Glucose, Bld: 98 mg/dL (ref 70–99)

## 2011-07-21 MED ORDER — CIPROFLOXACIN HCL 500 MG PO TABS: 500.0000 mg | ORAL_TABLET | Freq: Two times a day (BID) | ORAL | Status: DC

## 2011-07-21 MED ORDER — SODIUM CHLORIDE 0.9 % IV SOLN
1000.0000 mL | Freq: Once | INTRAVENOUS | Status: AC
  Administered 2011-07-21: 1000 mL via INTRAVENOUS

## 2011-07-21 MED ORDER — HEPARIN SOD (PORK) LOCK FLUSH 100 UNIT/ML IV SOLN
INTRAVENOUS | Status: AC
  Filled 2011-07-21: qty 5

## 2011-07-21 MED ORDER — CIPROFLOXACIN HCL 500 MG PO TABS
ORAL_TABLET | ORAL | Status: AC
  Filled 2011-07-21: qty 1

## 2011-07-21 MED ORDER — CIPROFLOXACIN HCL 500 MG PO TABS
500.0000 mg | ORAL_TABLET | Freq: Once | ORAL | Status: AC
  Administered 2011-07-21: 500 mg via ORAL

## 2011-07-21 MED ORDER — HYDROMORPHONE HCL PF 1 MG/ML IJ SOLN
INTRAMUSCULAR | Status: AC
  Filled 2011-07-21: qty 1

## 2011-07-21 MED ORDER — OXYCODONE-ACETAMINOPHEN 5-325 MG PO TABS: 2.0000 | ORAL_TABLET | ORAL | Status: AC | PRN

## 2011-07-21 MED ORDER — HYDROMORPHONE HCL PF 1 MG/ML IJ SOLN
1.0000 mg | Freq: Once | INTRAMUSCULAR | Status: AC
  Administered 2011-07-21: 1 mg via INTRAVENOUS

## 2011-07-21 MED ORDER — CIPROFLOXACIN HCL 500 MG PO TABS: 500.0000 mg | ORAL_TABLET | Freq: Two times a day (BID) | ORAL | Status: AC

## 2011-07-21 MED ORDER — HYDROMORPHONE HCL PF 1 MG/ML IJ SOLN
1.0000 mg | Freq: Once | INTRAMUSCULAR | Status: AC
  Administered 2011-07-21: 1 mg via INTRAVENOUS
  Filled 2011-07-21: qty 1

## 2011-07-21 MED ORDER — ONDANSETRON HCL 4 MG/2ML IJ SOLN
4.0000 mg | Freq: Once | INTRAMUSCULAR | Status: AC
  Administered 2011-07-21: 4 mg via INTRAVENOUS
  Filled 2011-07-21: qty 2

## 2011-07-21 NOTE — ED Notes (Signed)
deaccessed port, flushed with heparin. Pt tolearted well

## 2011-07-21 NOTE — ED Notes (Signed)
Pt c/o R flank pain.  Pt states she had Kidney Stents placed on 3/21 and has had pain since.  Pt attempted to call [facility] but unable to see her for 2 weeks.  Pt taking Ibuprofen, Naproxen with no relief.  Pt denies dysuria, hematuria or increased frequency.

## 2011-07-21 NOTE — Discharge Instructions (Signed)

## 2011-07-21 NOTE — ED Provider Notes (Signed)
History     CSN: [id number]  Arrival date & time 07/21/11  4098   First MD Initiated Contact with Patient 07/21/11 1910      Chief Complaint  Patient presents with  . Flank Pain    (Consider location/radiation/quality/duration/timing/severity/associated sxs/prior treatment) Patient is a 41 y.o. female presenting with flank pain. The history is provided by the patient.  Flank Pain This is a chronic problem. The current episode started more than 1 month ago. The problem occurs constantly. The problem has been unchanged. Associated symptoms include abdominal pain. The symptoms are aggravated by nothing. She has tried nothing for the symptoms. The treatment provided moderate relief.  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  Pt reports continued pain.  Pt reports she feels sick.  Pt reports pain and feeling aggitatted.  Past Medical History  Diagnosis Date  . Colorectal cancer     colo-rectal ca   . Primary appendiceal adenocarcinoma     Past Surgical History  Procedure Date  . Abdominal surgery   . Ostomy take down   . Rectoperitoneal fistula closure   . Renal artery stent     No family history on file.  History  Substance Use Topics  . Smoking status: Never Smoker   . Smokeless tobacco: Not on file  . Alcohol Use: No    OB History    Grav Para Term Preterm Abortions TAB SAB Ect Mult Living                  Review of Systems  Gastrointestinal: Positive for abdominal pain.  Genitourinary: Positive for flank pain.  All other systems reviewed and are negative.    Allergies  Compazine  Home Medications   Current Outpatient Rx  Name Route Sig Dispense Refill  . ACETAMINOPHEN 500 MG PO TABS Oral Take 1,000 mg by mouth every 6 (six) hours as needed. For pain    . DIAZEPAM 5 MG PO TABS Oral Take 5 mg by mouth once as needed. Anxiety    . HYDROCODONE-ACETAMINOPHEN 5-325 MG PO TABS Oral Take 2 tablets by mouth every 6 (six) hours  as needed. For pain    . NAPROXEN 500 MG PO TABS Oral Take 500 mg by mouth 2 (two) times daily with a meal. For pain    . CIPROFLOXACIN HCL 500 MG PO TABS Oral Take 1 tablet (500 mg total) by mouth every 12 (twelve) hours. 14 tablet 0  . OXYCODONE-ACETAMINOPHEN 5-325 MG PO TABS Oral Take 2 tablets by mouth every 4 (four) hours as needed for pain. 16 tablet 0    BP 108/69  Pulse 88  Temp(Src) 98.2 F (36.8 C) (Oral)  Resp 16  Ht 5\' 2"  (1.575 m)  Wt 92 lb (41.731 kg)  BMI 16.83 kg/m2  SpO2 100%  Physical Exam  Nursing note and vitals reviewed. Constitutional: She is oriented to person, place, and time. She appears well-developed and well-nourished.  HENT:  Head: Normocephalic and atraumatic.  Right Ear: External ear normal.  Left Ear: External ear normal.  Nose: Nose normal.  Mouth/Throat: Oropharynx is clear and moist.  Eyes: Conjunctivae and EOM are normal. Pupils are equal, round, and reactive to light.  Neck: Normal range of motion. Neck supple.  Cardiovascular: Normal rate and normal heart sounds.   Pulmonary/Chest: Effort normal and breath sounds normal.  Abdominal: Soft. Bowel sounds are normal.  Musculoskeletal: Normal range of motion.  Neurological: She is alert and  oriented to person, place, and time. She has normal reflexes.  Skin: Skin is warm.  Psychiatric: She has a normal mood and affect.    ED Course  Procedures (including critical care time)  Labs Reviewed  URINALYSIS, ROUTINE W REFLEX MICROSCOPIC - Abnormal; Notable for the following:    APPearance CLOUDY (*)    Hgb urine dipstick MODERATE (*)    Protein, ur 30 (*)    Leukocytes, UA MODERATE (*)    All other components within normal limits  URINE MICROSCOPIC-ADD ON - Abnormal; Notable for the following:    Squamous Epithelial / LPF FEW (*)    Bacteria, UA FEW (*)    All other components within normal limits  CBC - Abnormal; Notable for the following:    RBC 3.59 (*)    Hemoglobin 9.9 (*)    HCT  31.5 (*)    RDW 16.4 (*)    All other components within normal limits  DIFFERENTIAL  BASIC METABOLIC PANEL   Ct Abdomen Pelvis Wo Contrast  07/21/2011  *RADIOLOGY REPORT*  Clinical Data:  Right-sided flank pain.  Renal stents placed on 03/21 and has had pain since.  History of rectal cancer with radiation treatment.  Hysterectomy.  CT ABDOMEN AND PELVIS WITHOUT CONTRAST (CT UROGRAM)  Technique: Contiguous axial images of the abdomen and pelvis without oral or intravenous contrast were obtained.  Comparison: Contrast enhanced CT of 04/16/2011.  Findings:   Clear lung bases.  Normal heart size without pericardial or pleural effusion.  Exam is severely degraded for evaluation of entities other than hydronephrosis and urinary tract calculi, given lack of IV contrast and paucity of abdominal pelvic fat.  There also ill-defined fat planes within the pelvis, secondary prior radiation therapy.  Grossly similar right hepatic lobe subcapsular low density lesion on image 10.  Far lateral segment left liver lobe is grossly similar to on the prior.  Normal uninfused appearance of the spleen.  A small hiatal hernia.  Pancreas, gallbladder, and adrenal glands poorly evaluated.  The left kidney demonstrates no gross hydronephrosis or calculi.  The right kidney demonstrates interval placement of a ureteric stent (since 04/16/2011).  The right-sided urinary tract obstruction has resolved.  There are no right-sided renal calculi. The ureteric stent terminates in the urinary bladder.  The proximal portion of the stent has an atypical course.  Appears looped both within the right renal pelvis and the right upper pole collecting system.  See scout or coronal reformatted series five.  Retroperitoneum not well evaluated.  There is right lower quadrant ostomy, without gross bowel obstruction. No free intraperitoneal air.  Numerous tiny foci of anterior abdominal gas are felt to be intraluminal, but poorly evaluated. The bladder is  thick-walled with surrounding edema, not significantly changed; likely radiation induced.  Surgical changes within the posterior pelvis.  Gas and fluid collection within the presacral space is also suboptimally evaluated.  Grossly similar to the prior, 5.2 x 6.2 cm on image 52. The previously described probable fistulous communication to the vagina is not well evaluated.  No acute osseous abnormality.  IMPRESSION:  1.  Placement of a right ureteric stent since 04/16/2011. Resolution of right-sided hydronephrosis.  Atypical course of proximal portion of stent, appearing to be looped within the right renal pelvis and upper pole collecting system. 2.  No urinary tract calculi. 3.  Otherwise, moderate to severely degraded exam, secondary to multiple factors detailed above. 4.  Grossly similar presacral gas and fluid collection.  Likely postoperative infection.  Previously described fistulous communication to the vagina is not well evaluated today. 5.  Right lower quadrant ostomy, without gross obstruction. 6.  If other causes of right-sided pain (other than the urinary tract obstruction) are suspected, dedicated oral and IV postcontrast CT is recommended.  Original Report Authenticated By: Consuello Bossier, [name]     1. UTI (lower urinary tract infection)       MDM  Pt given iv fluids and pain medication.  UA shows infection.  I will treat with cipro.  Pt advised to see her Md for recheck        Lonia Skinner [place], [place] 07/21/11 2140

## 2011-07-21 NOTE — ED Notes (Signed)
Patient transported to CT 

## 2011-07-22 NOTE — ED Provider Notes (Signed)
Medical screening examination/treatment/procedure(s) were performed by non-physician practitioner and as supervising physician I was immediately available for consultation/collaboration.  Kassidie Hendriks, MD 07/22/11 0024 

## 2011-07-28 IMAGING — CT CT ABD-PELV W/ CM
2 of 4 series · 15 of 46 positions shown, 17 images · IV contrast (agent unspecified)
Comparison: 05/14/2009

***ADDENDUM*** CREATED: 07/02/2010 [DATE]

COMPARISON is made to CT of the abdomen and pelvis dated 05/28/10.
Images show irregular, thick walled pelvic collection filled with
fluid, gas and contrast material unchanged in size.  Adjacent
stranding and presacral edema is unchanged.  No definite fistula is
seen on the prior exam.
No small bowel wall thickening or obstruction is present on the
prior exam.
The right sided hydronephrosis is present but has increased
compared to the exam of [DATE].  The bladder wall is thick but
incompletely distended.
***END ADDENDUM*** SIGNED BY: Elson Daud, [name]
CLINICAL DATA: Pain, reduced ostomy output.  History of mucinous
adenocarcinoma of the rectum with chemotherapy.
CT ABDOMEN AND PELVIS WITH CONTRAST
TECHNIQUE: Multidetector CT imaging of the abdomen and pelvis was
performed following the standard protocol during bolus
administration of intravenous contrast.
Contrast: 100 ml of 6mnipaque-PNN

[Series 2: rtn a/p with · axial · 0.63mm/px · z∈[-394,-58]mm · 12 of 81 slices shown, 14 images]
[im 7/81  soft-tissue]
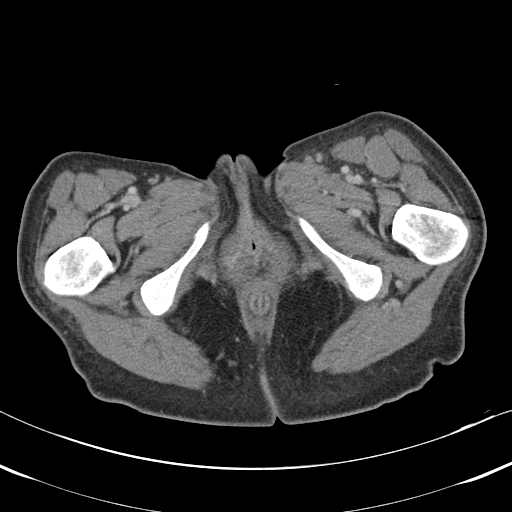
[im 7/81  bone]
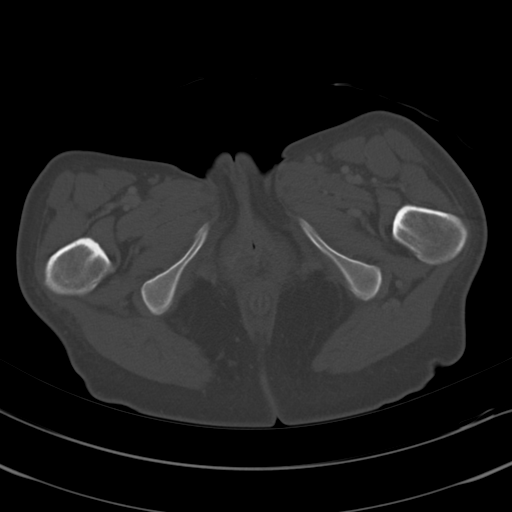
[im 13/81  soft-tissue]
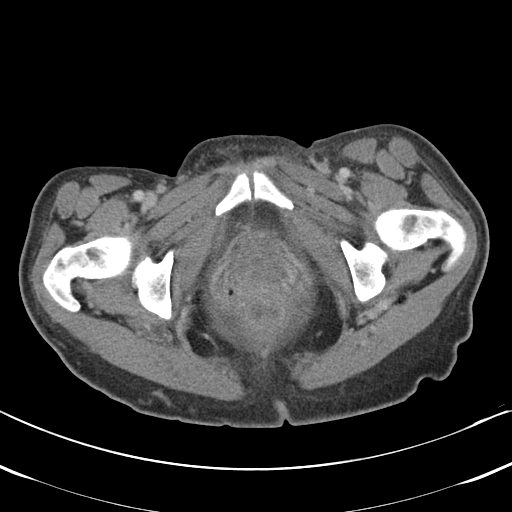
[im 19/81  soft-tissue]
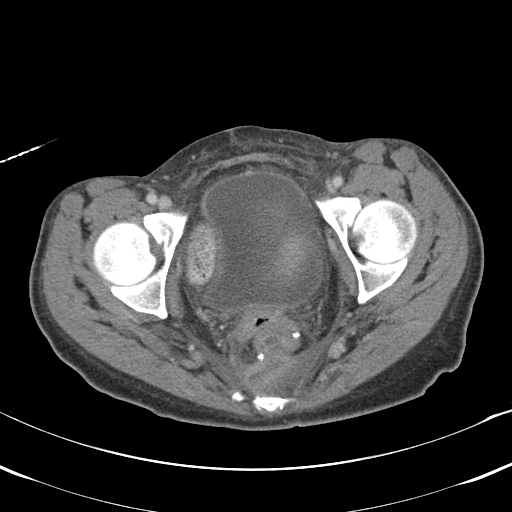
[im 25/81  soft-tissue]
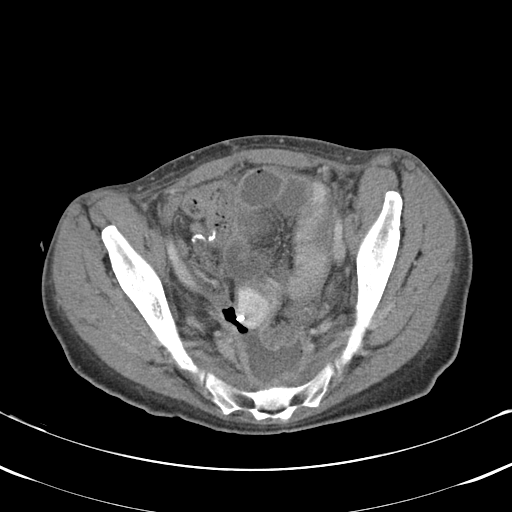
[im 31/81  soft-tissue]
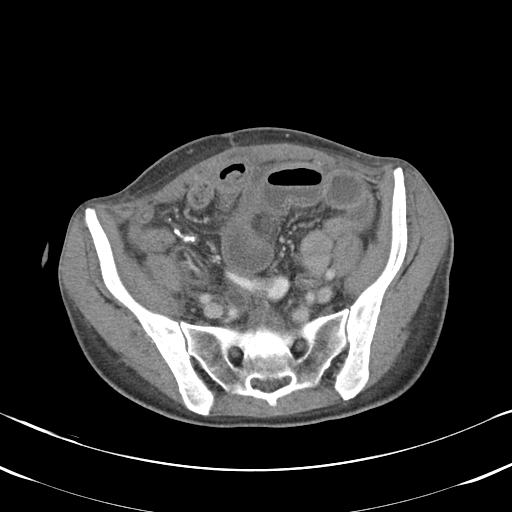
[im 37/81  soft-tissue]
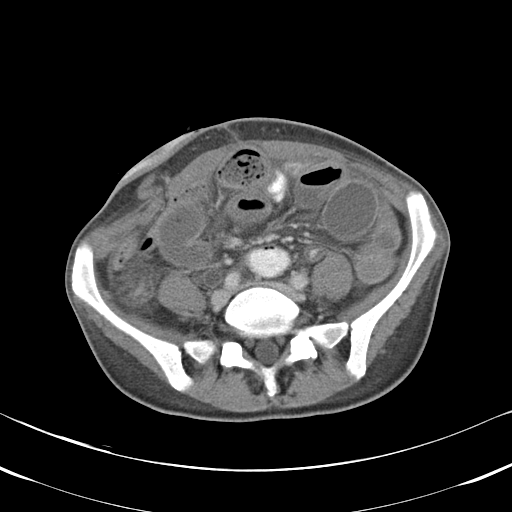
[im 44/81  soft-tissue]
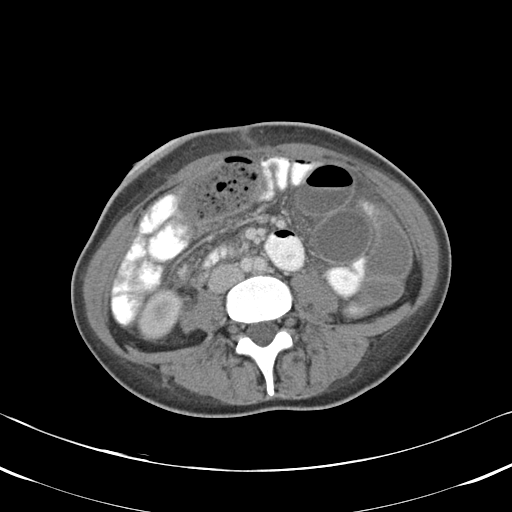
[im 50/81  soft-tissue]
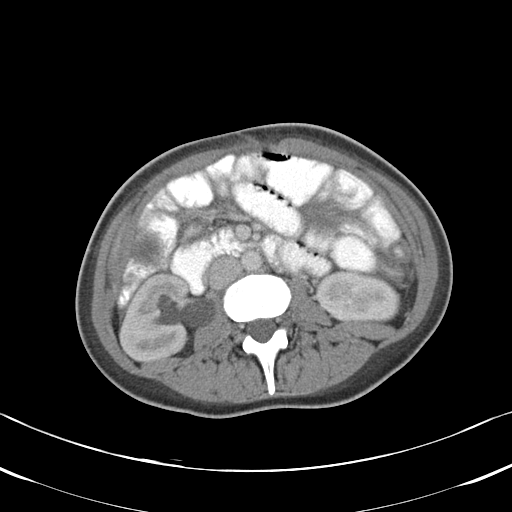
[im 56/81  soft-tissue]
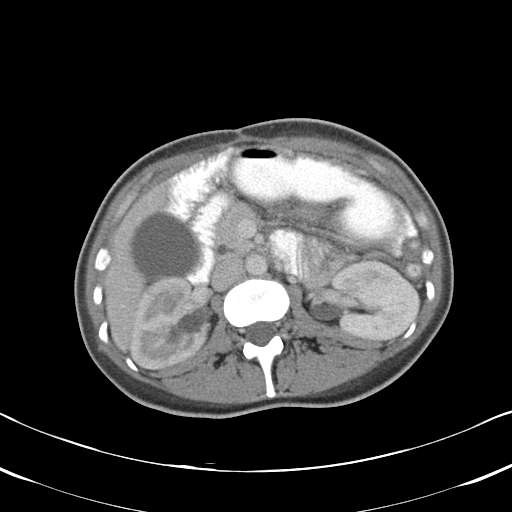
[im 56/81  bone]
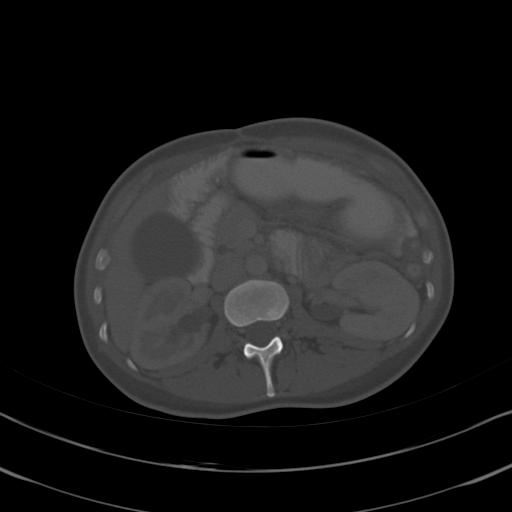
[im 62/81  soft-tissue]
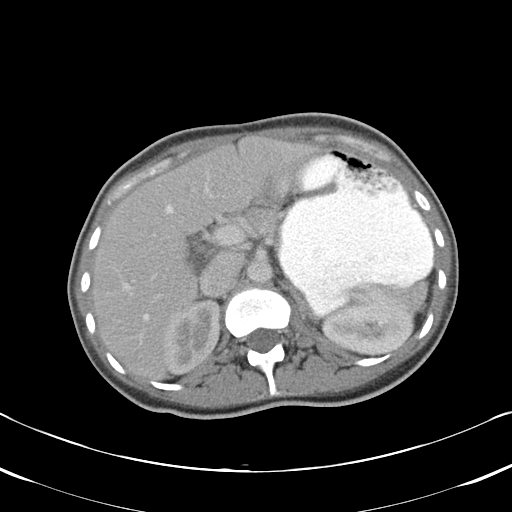
[im 68/81  soft-tissue]
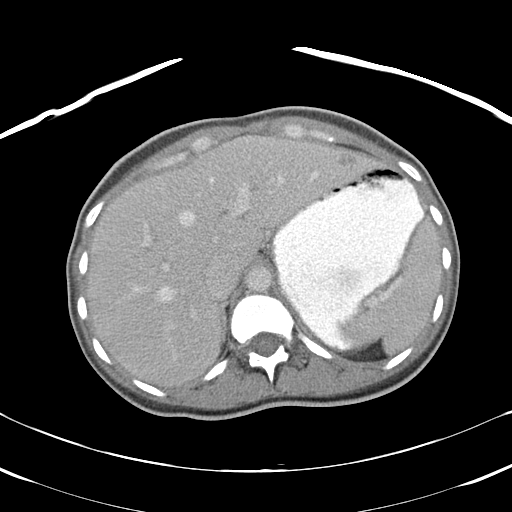
[im 74/81  soft-tissue]
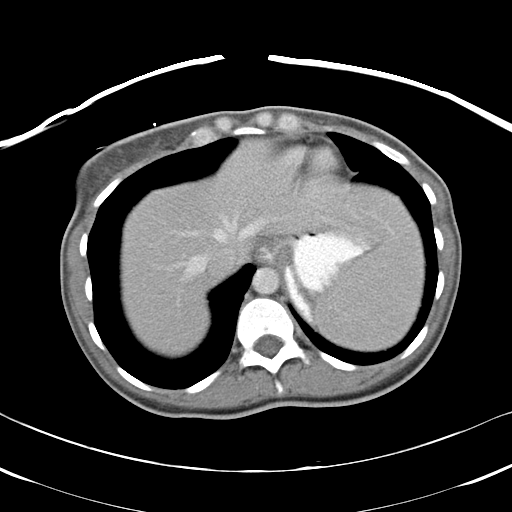

[Series 602: <mpr thick range> · coronal · 0.78mm/px · 3 of 63 slices shown]
[im 21/63  soft-tissue]
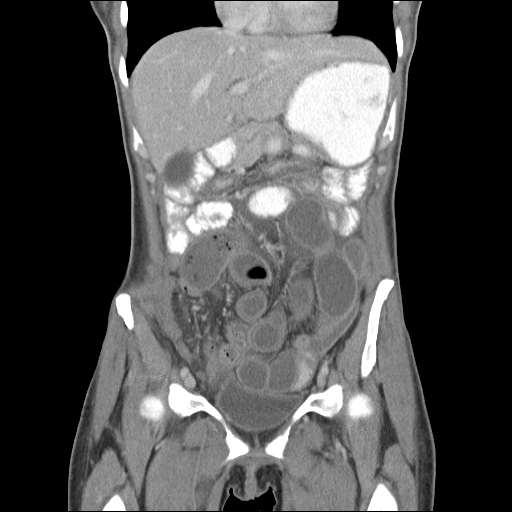
[im 28/63  soft-tissue]
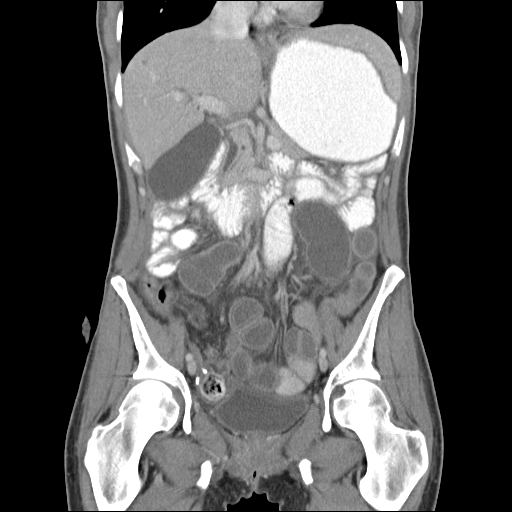
[im 35/63  soft-tissue]
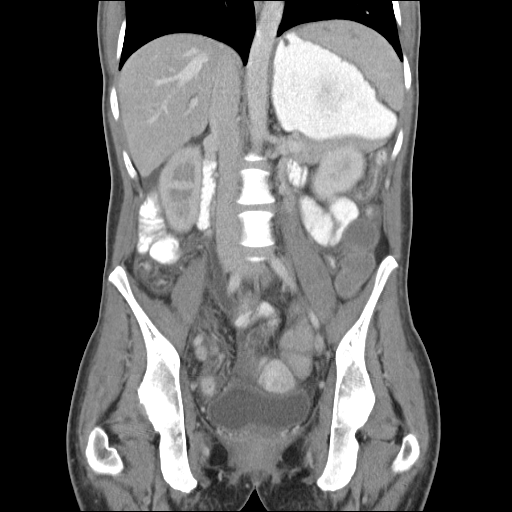

[15 of 46 positions shown; findings below may reference images not displayed]

FINDINGS: The lung bases are clear.  The heart is normal in size
without pericardial effusion.  Several liver lesions are again
identified with the largest in the lateral segment of the left
hepatic lobe measuring 1.7 cm which are incompletely characterized
but unchanged.  The spleen, adrenal glands and pancreas are
unchanged.  The gallbladder is within normal limits.  The left
kidney is normal.  There is a new right-sided hydronephrosis which
is seen into the pelvis, terminating at the level of the surgical
clips in the pelvis.

Oral contrast material is seen extending to the left lower quadrant
and loops of proximal ileum.  There are loops of dilated ileum is
seen in the central portion of the abdomen with submucosal edema
and adjacent stranding.  There is sacralization of the intraluminal
material suggesting slow transit time. The loops of small bowel in
the right lower quadrant are as closely adherent to the intra-
abdominal wall with angulation suggesting adhesions.  Right lower
quadrant ostomy is again noted which is decompressed.  The colon is
decompressed.  A gas and fluid containing collection is again seen
in the dependent portion of the pelvis which measures 6.0 x 4.4 cm
in the adjacent soft tissue stranding. New presacral edema is seen.
The no definite lymphadenopathy is seen.

The uterus is absent and no adnexal mass is seen.  No aggressive
lytic or blastic bone lesions are seen.
IMPRESSION: 1.  No new mucinous adenocarcinoma with cystic, 4 feet enhancing
mass lesion again seen in the dependent portion the pelvis,
increased in size compared to most recent exam concerning for
progressive malignancy.  There is an air-fluid levels seen in the
mass for which secondary infection cannot be excluded.
2.  Dilated, abnormal small bowel loops seen in the central portion
of the abdomen with small bowel wall thickening suggesting
enteritis. These loops are also dilated with decompressed loops of
bowel in the right lower quadrant adjacent to the ileostomy
suggesting underlying obstruction/adhesions.
3.  Recurrence of a right-sided hydronephrosis to the level of the
pelvic mass.

4.  Unchanged hepatic masses.

## 2011-07-29 IMAGING — CR DG ABDOMEN ACUTE W/ 1V CHEST
3 series · 3 of 3 positions shown · non-contrast
Comparison: 06/24/2010

CLINICAL DATA: Lower abdominal pain and nausea. No rectal cancer.

ACUTE ABDOMEN SERIES (ABDOMEN 2 VIEW & CHEST 1 VIEW)

[w chest pa]
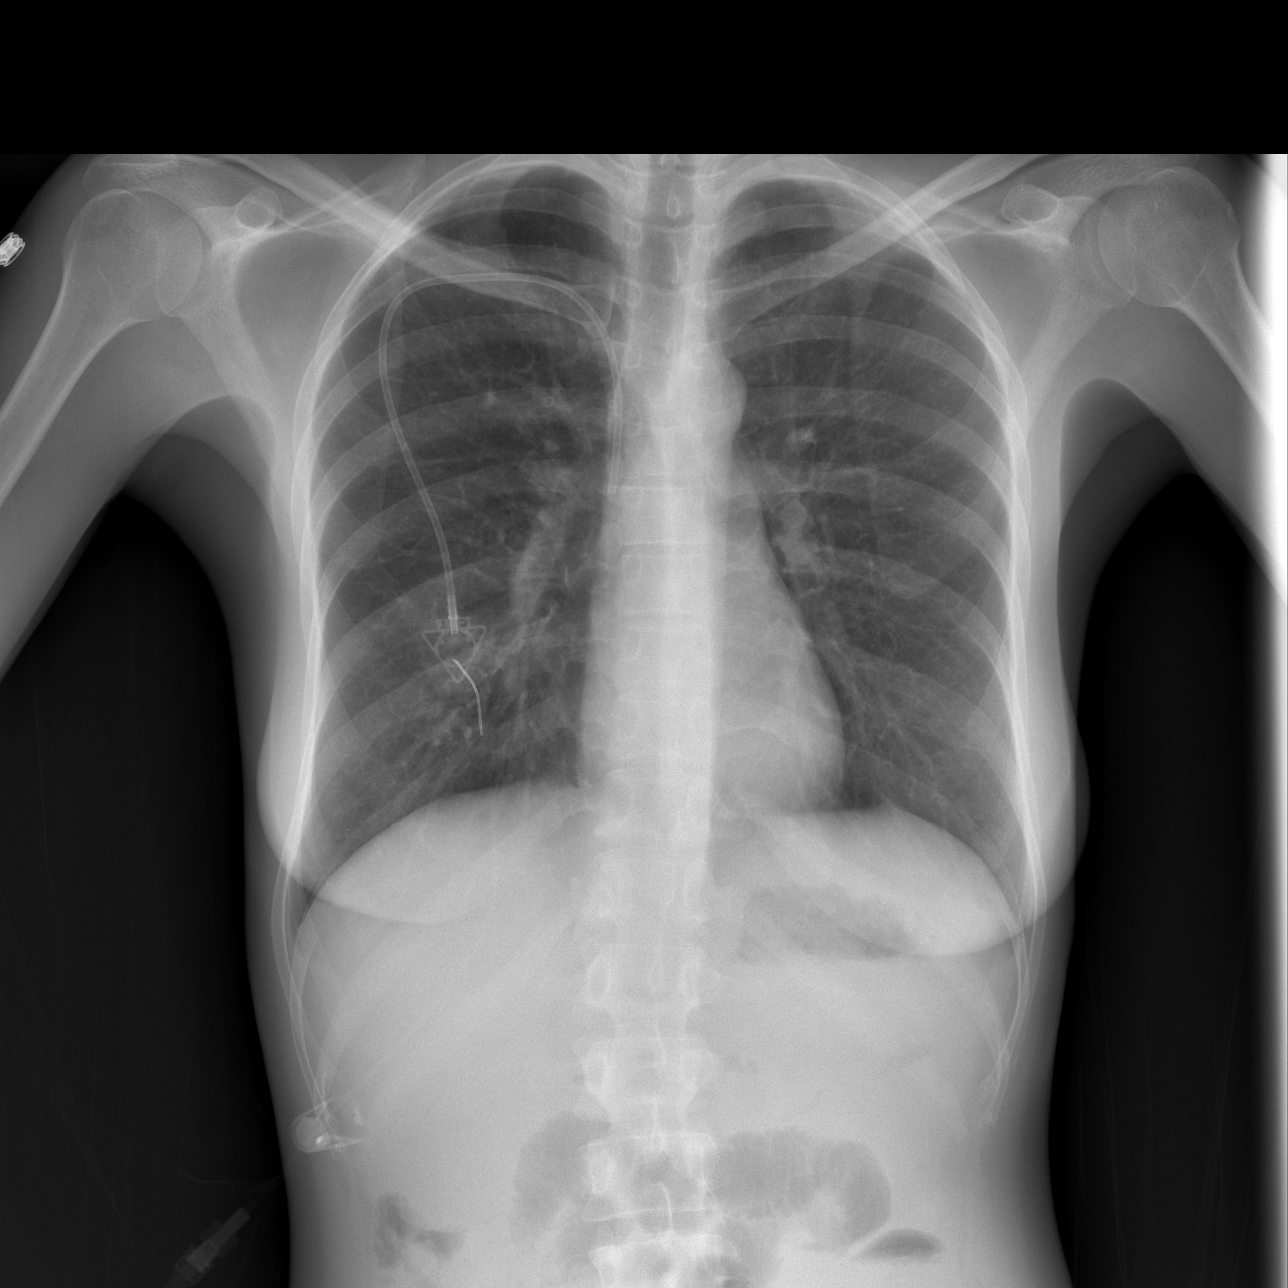

[w abdomen upright *]
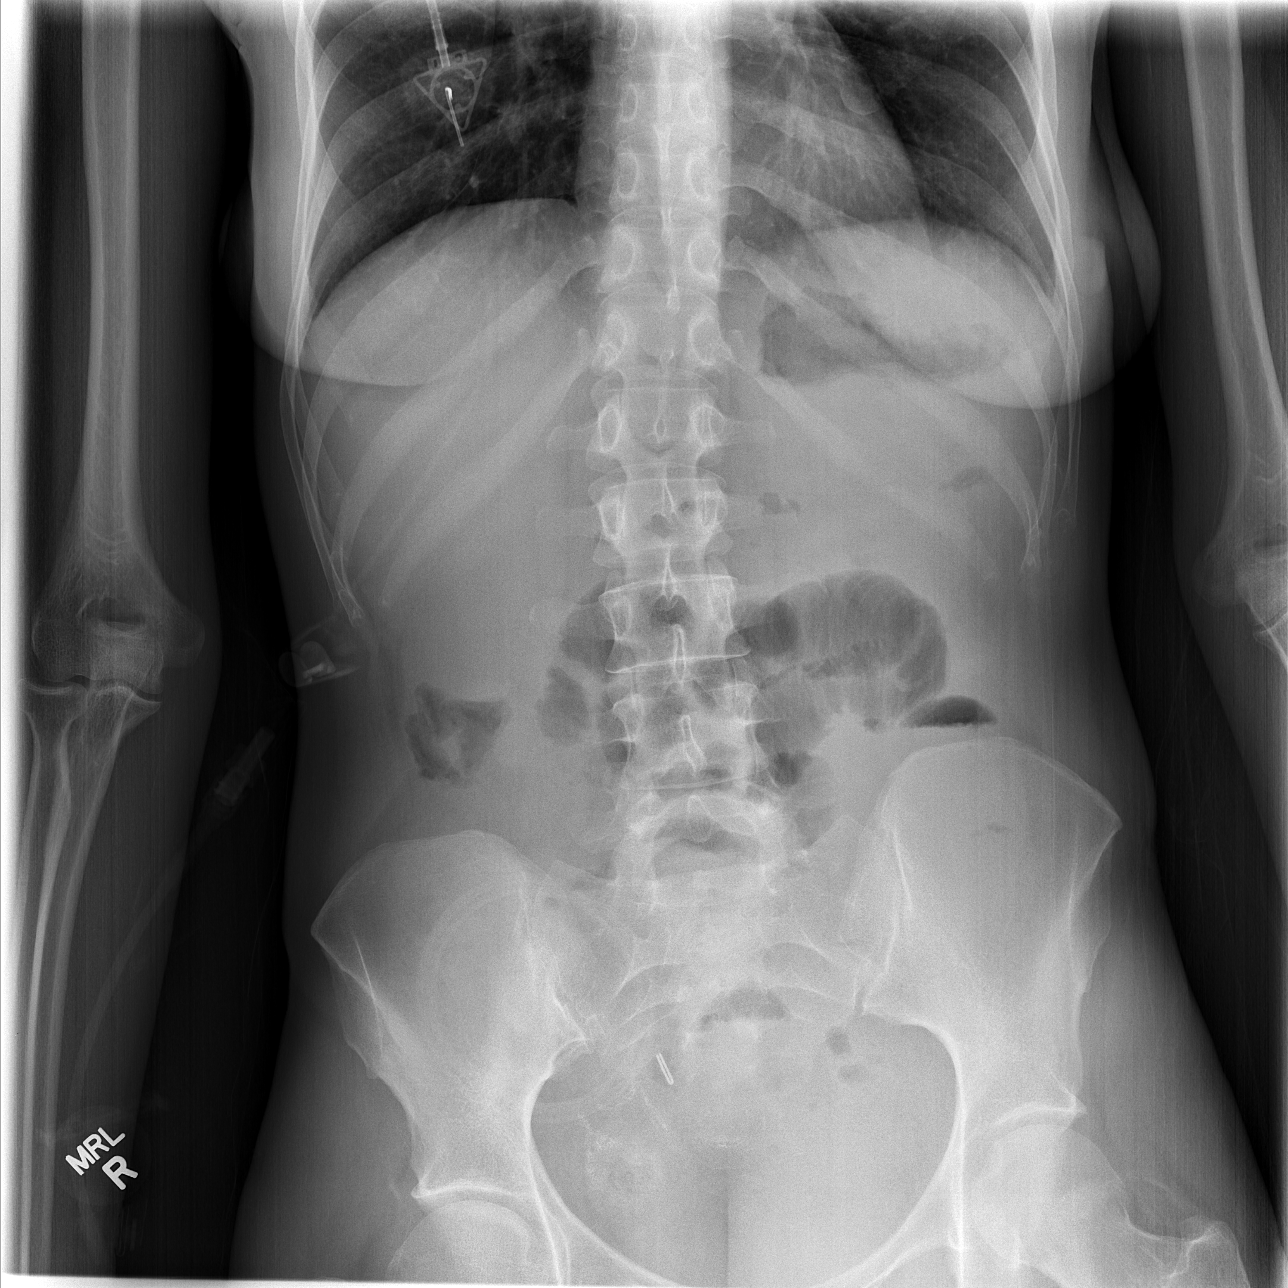

[t abdomen supine]
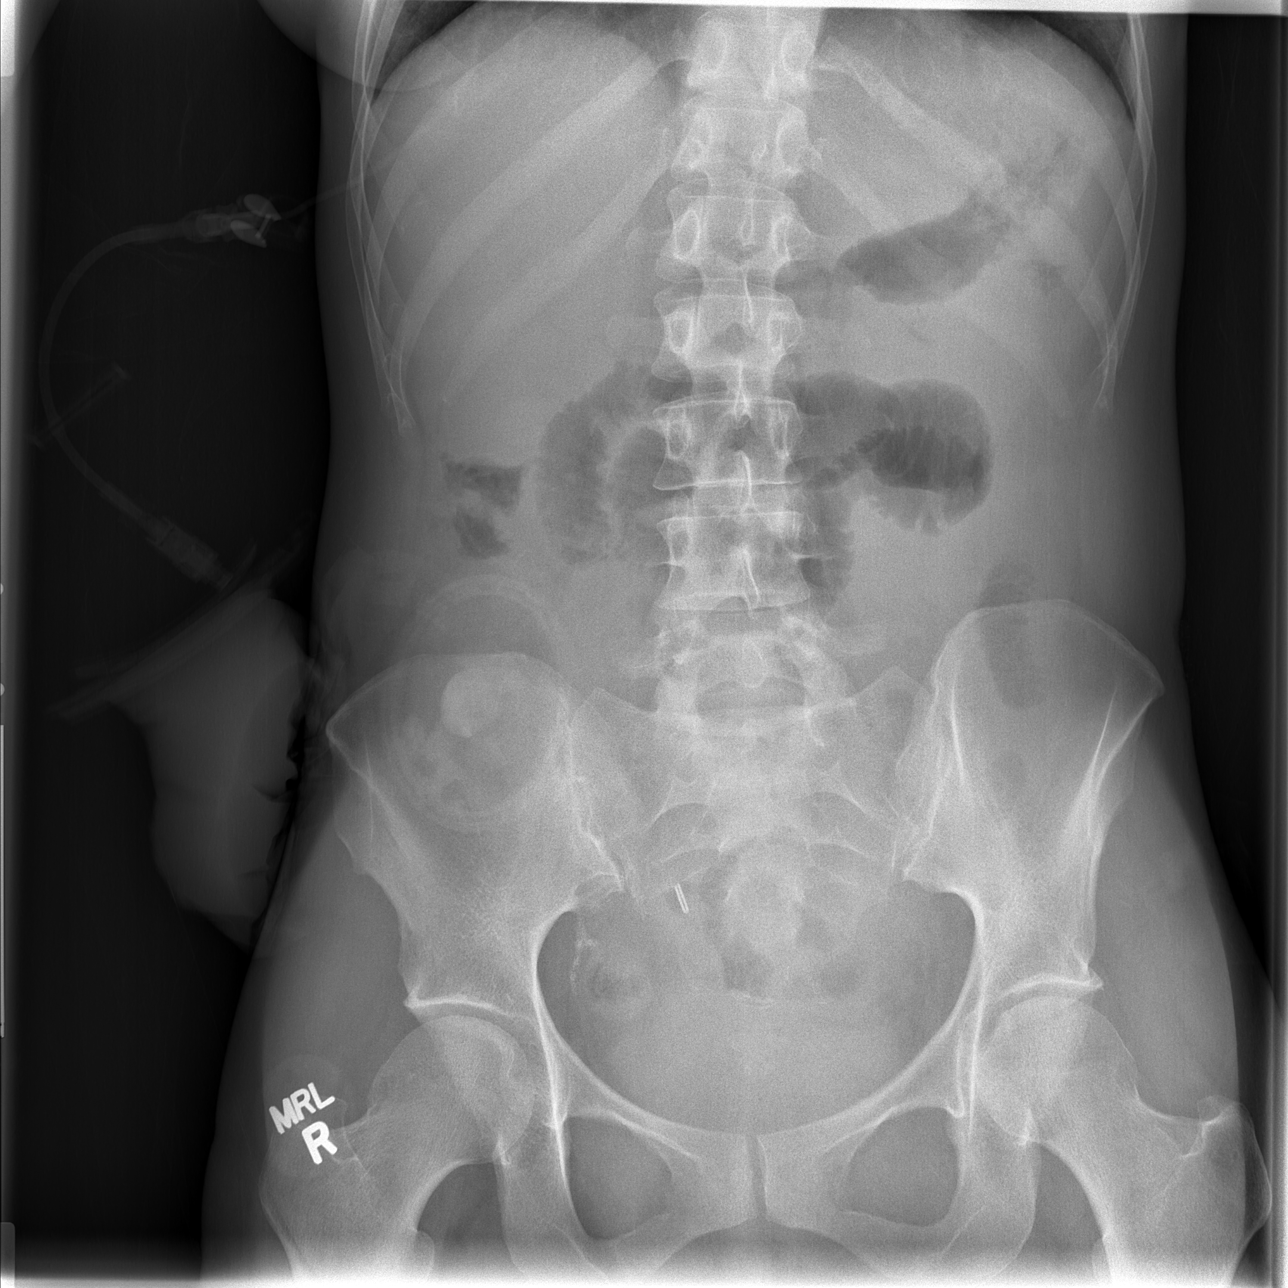

[3 of 3 positions shown; findings below may reference images not displayed]

FINDINGS: The heart and lungs appear normal.  Port-A-Cath is in
place.  There are slightly distended loops of small bowel in the
mid abdomen.  There is only minimal air elsewhere in the bowel.  No
colon distention.  There is an ostomy in the right lower quadrant.
Surgical clips and staples are seen in the pelvis.
IMPRESSION: Slightly prominent small bowel loops in the mid abdomen suggesting
small bowel obstruction.  This has minimally progressed since the
prior exam.

## 2011-08-01 ENCOUNTER — Encounter (HOSPITAL_COMMUNITY): Payer: Self-pay | Admitting: Family Medicine

## 2011-08-01 ENCOUNTER — Emergency Department (HOSPITAL_COMMUNITY)
Admission: EM | Admit: 2011-08-01 | Discharge: 2011-08-02 | Disposition: A | Discharge status: Home / Self Care | Payer: Medicaid Other | Attending: Emergency Medicine | Admitting: Emergency Medicine

## 2011-08-01 DIAGNOSIS — R8290 Unspecified abnormal findings in urine: Secondary | ICD-10-CM

## 2011-08-01 DIAGNOSIS — Z932 Ileostomy status: Secondary | ICD-10-CM | POA: Insufficient documentation

## 2011-08-01 DIAGNOSIS — R109 Unspecified abdominal pain: Secondary | ICD-10-CM | POA: Insufficient documentation

## 2011-08-01 DIAGNOSIS — Z85038 Personal history of other malignant neoplasm of large intestine: Secondary | ICD-10-CM | POA: Insufficient documentation

## 2011-08-01 DIAGNOSIS — R82998 Other abnormal findings in urine: Secondary | ICD-10-CM | POA: Insufficient documentation

## 2011-08-01 LAB — URINALYSIS, ROUTINE W REFLEX MICROSCOPIC
Bilirubin Urine: NEGATIVE
Ketones, ur: NEGATIVE mg/dL
Nitrite: NEGATIVE
pH: 6 (ref 5.0–8.0)

## 2011-08-01 LAB — BASIC METABOLIC PANEL
Chloride: 103 mEq/L (ref 96–112)
GFR calc Af Amer: >90 mL/min (ref >90)
GFR calc non Af Amer: 83 mL/min — ABNORMAL LOW (ref >90)
Potassium: 3.8 mEq/L (ref 3.5–5.1)
Sodium: 140 mEq/L (ref 135–145)

## 2011-08-01 LAB — URINE MICROSCOPIC-ADD ON

## 2011-08-01 MED ORDER — HYDROMORPHONE HCL PF 1 MG/ML IJ SOLN
1.0000 mg | Freq: Once | INTRAMUSCULAR | Status: AC
  Administered 2011-08-01: 1 mg via INTRAVENOUS
  Filled 2011-08-01: qty 1

## 2011-08-01 MED ORDER — SODIUM CHLORIDE 0.9 % IJ SOLN
10.0000 mL | INTRAMUSCULAR | Status: DC | PRN
Start: 2011-08-01 — End: 2011-08-02
  Administered 2011-08-01 – 2011-08-02 (×2): 10 mL

## 2011-08-01 MED ORDER — HYDROMORPHONE HCL PF 1 MG/ML IJ SOLN
1.0000 mg | Freq: Once | INTRAMUSCULAR | Status: AC
  Administered 2011-08-02: 1 mg via INTRAVENOUS
  Filled 2011-08-01: qty 1

## 2011-08-01 NOTE — ED Notes (Signed)
Pt reports having stents placed in right kidney x1 month ago. Reports pain to right flank since surgery.  Reports "it feels a little funny when I urinate."

## 2011-08-01 NOTE — ED Notes (Signed)
RN request to obtain labs via pt port cath

## 2011-08-01 NOTE — ED Provider Notes (Signed)
History     CSN: [id number]  Arrival date & time 08/01/11  4782   First MD Initiated Contact with Patient 08/01/11 2008      Chief Complaint  Patient presents with  . Flank Pain    (Consider location/radiation/quality/duration/timing/severity/associated sxs/prior treatment) Patient is a 41 y.o. female presenting with flank pain. The history is provided by the patient.  Flank Pain This is a chronic problem. The current episode started more than 1 month ago. The problem occurs constantly. The problem has been unchanged. Pertinent negatives include no fever. Associated symptoms comments: The patient has a history of rectal cancer s/p resection, ileostomy, radiation and chemo with right flank pain that has become chronic. She is followed by urology for ureteral stent placement one month ago secondary to post-radiation obstruction and reports the plan is to change stent every 3 months. She states pain in flank has not been relieved as hoped after stent placement. No change to her pain, no fever, N, V. . The symptoms are aggravated by nothing. Treatments tried: She is followed regularly by urology as well as oncology.    Past Medical History  Diagnosis Date  . Colorectal cancer     colo-rectal ca   . Primary appendiceal adenocarcinoma     Past Surgical History  Procedure Date  . Abdominal surgery   . Ostomy take down   . Rectoperitoneal fistula closure   . Renal artery stent     History reviewed. No pertinent family history.  History  Substance Use Topics  . Smoking status: Never Smoker   . Smokeless tobacco: Not on file  . Alcohol Use: No    OB History    Grav Para Term Preterm Abortions TAB SAB Ect Mult Living                  Review of Systems  Constitutional: Negative.  Negative for fever.  Respiratory: Negative.  Negative for shortness of breath.   Gastrointestinal: Negative.   Genitourinary: Positive for flank pain. Negative for dysuria.    Allergies    Compazine  Home Medications   Current Outpatient Rx  Name Route Sig Dispense Refill  . DIAZEPAM 5 MG PO TABS Oral Take 5 mg by mouth once as needed. Anxiety    . IBUPROFEN 200 MG PO TABS Oral Take 800 mg by mouth every 6 (six) hours as needed. For pain    . CIPROFLOXACIN HCL 500 MG PO TABS Oral Take 1 tablet (500 mg total) by mouth every 12 (twelve) hours. 14 tablet 0  . OXYCODONE-ACETAMINOPHEN 5-325 MG PO TABS Oral Take 2 tablets by mouth every 4 (four) hours as needed for pain. 16 tablet 0    BP 117/72  Pulse 98  Temp(Src) 97.7 F (36.5 C) (Oral)  Resp 18  Ht 5' 2.5" (1.588 m)  Wt 92 lb (41.731 kg)  BMI 16.56 kg/m2  SpO2 100%  Physical Exam  Constitutional: She appears well-developed and well-nourished.  Cardiovascular: Normal rate.   No murmur heard. Pulmonary/Chest: Effort normal and breath sounds normal.  Abdominal: Soft. There is no tenderness.       Ileostomy in place. Site unremarkable and nontender.   Skin: Skin is warm and dry.    ED Course  Procedures (including critical care time)  Labs Reviewed  URINALYSIS, ROUTINE W REFLEX MICROSCOPIC - Abnormal; Notable for the following:    APPearance CLOUDY (*)    Hgb urine dipstick SMALL (*)    Protein, ur 100 (*)  Leukocytes, UA MODERATE (*)    All other components within normal limits  URINE MICROSCOPIC-ADD ON - Abnormal; Notable for the following:    Squamous Epithelial / LPF FEW (*)    All other components within normal limits  BASIC METABOLIC PANEL   No results found.  Results for orders placed during the hospital encounter of 08/01/11  URINALYSIS, ROUTINE W REFLEX MICROSCOPIC      Component Value Range   Color, Urine YELLOW  YELLOW    APPearance CLOUDY (*) CLEAR    Specific Gravity, Urine 1.025  1.005 - 1.030    pH 6.0  5.0 - 8.0    Glucose, UA NEGATIVE  NEGATIVE (mg/dL)   Hgb urine dipstick SMALL (*) NEGATIVE    Bilirubin Urine NEGATIVE  NEGATIVE    Ketones, ur NEGATIVE  NEGATIVE (mg/dL)    Protein, ur 161 (*) NEGATIVE (mg/dL)   Urobilinogen, UA 0.2  0.0 - 1.0 (mg/dL)   Nitrite NEGATIVE  NEGATIVE    Leukocytes, UA MODERATE (*) NEGATIVE   URINE MICROSCOPIC-ADD ON      Component Value Range   Squamous Epithelial / LPF FEW (*) RARE    WBC, UA 21-50  <3 (WBC/hpf)   RBC / HPF 0-2  <3 (RBC/hpf)   Bacteria, UA RARE  RARE    Urine-Other MUCOUS PRESENT    BASIC METABOLIC PANEL      Component Value Range   Sodium 140  135 - 145 (mEq/L)   Potassium 3.8  3.5 - 5.1 (mEq/L)   Chloride 103  96 - 112 (mEq/L)   CO2 28  19 - 32 (mEq/L)   Glucose, Bld 86  70 - 99 (mg/dL)   BUN 17  6 - 23 (mg/dL)   Creatinine, Ser 0.96  0.50 - 1.10 (mg/dL)   Calcium 9.5  8.4 - 04.5 (mg/dL)   GFR calc non Af Amer 83 (*) >90 (mL/min)   GFR calc Af Amer >90  >90 (mL/min)    No diagnosis found.  1. Flank pain 2. Abnormal UA  MDM  Pain is managed. Labs are essentially normal. Urinalysis with leukocytosis but same as previous with rare bacteria and patient symptomatic with ureteral stents. Recommended she take 3 days of Cipro she has and allow urologist to follow up on cultures. Will send home with Percocet.        Rodena Medin, PA-C 08/02/11 0121

## 2011-08-02 MED ORDER — HEPARIN SOD (PORK) LOCK FLUSH 100 UNIT/ML IV SOLN: 500.0000 [IU] | INTRAVENOUS | Status: DC | PRN

## 2011-08-02 MED ORDER — OXYCODONE-ACETAMINOPHEN 5-325 MG PO TABS: 1.0000 | ORAL_TABLET | ORAL | Status: AC | PRN

## 2011-08-02 MED ORDER — HEPARIN SOD (PORK) LOCK FLUSH 100 UNIT/ML IV SOLN
500.0000 [IU] | INTRAVENOUS | Status: DC
  Administered 2011-08-02: 500 [IU]

## 2011-08-02 NOTE — Discharge Instructions (Signed)
TAKE YOUR CIPRO MEDICATION TWICE DAILY FOR 3 DAYS. YOUR UROLOGIST CAN FOLLOW UP ON THE URINARY CULTURES DONE TONIGHT AND RECOMMEND FURTHER MANAGEMENT. TAKE PERCOCET FOR FLANK PAIN. RETURN HERE AS NEEDED.

## 2011-08-03 LAB — URINE CULTURE
Colony Count: NO GROWTH
Culture  Setup Time: 201304211131

## 2011-08-03 NOTE — ED Provider Notes (Signed)
Medical screening examination/treatment/procedure(s) were performed by non-physician practitioner and as supervising physician I was immediately available for consultation/collaboration.   Laray Anger, DO 08/03/11 0117

## 2011-08-25 ENCOUNTER — Ambulatory Visit (HOSPITAL_BASED_OUTPATIENT_CLINIC_OR_DEPARTMENT_OTHER): Payer: Medicaid Other

## 2011-08-25 DIAGNOSIS — Z452 Encounter for adjustment and management of vascular access device: Secondary | ICD-10-CM

## 2011-08-25 DIAGNOSIS — C20 Malignant neoplasm of rectum: Secondary | ICD-10-CM

## 2011-08-25 MED ORDER — SODIUM CHLORIDE 0.9 % IJ SOLN
10.0000 mL | INTRAMUSCULAR | Status: DC | PRN
  Administered 2011-08-25: 10 mL via INTRAVENOUS
  Filled 2011-08-25: qty 10

## 2011-08-25 MED ORDER — HEPARIN SOD (PORK) LOCK FLUSH 100 UNIT/ML IV SOLN
500.0000 [IU] | Freq: Once | INTRAVENOUS | Status: AC
  Administered 2011-08-25: 500 [IU] via INTRAVENOUS
  Filled 2011-08-25: qty 5

## 2011-09-07 ENCOUNTER — Encounter (HOSPITAL_BASED_OUTPATIENT_CLINIC_OR_DEPARTMENT_OTHER): Payer: Self-pay | Admitting: *Deleted

## 2011-09-07 ENCOUNTER — Emergency Department (HOSPITAL_BASED_OUTPATIENT_CLINIC_OR_DEPARTMENT_OTHER)
Admission: EM | Admit: 2011-09-07 | Discharge: 2011-09-07 | Disposition: A | Discharge status: Home / Self Care | Payer: Medicaid Other | Attending: Emergency Medicine | Admitting: Emergency Medicine

## 2011-09-07 DIAGNOSIS — Z85038 Personal history of other malignant neoplasm of large intestine: Secondary | ICD-10-CM | POA: Insufficient documentation

## 2011-09-07 DIAGNOSIS — M549 Dorsalgia, unspecified: Secondary | ICD-10-CM | POA: Insufficient documentation

## 2011-09-07 MED ORDER — OXYCODONE-ACETAMINOPHEN 5-325 MG PO TABS: 2.0000 | ORAL_TABLET | ORAL | Status: AC | PRN

## 2011-09-07 MED ORDER — OXYCODONE-ACETAMINOPHEN 5-325 MG PO TABS
2.0000 | ORAL_TABLET | Freq: Once | ORAL | Status: AC
  Administered 2011-09-07: 2 via ORAL
  Filled 2011-09-07: qty 2

## 2011-09-07 NOTE — ED Notes (Addendum)
Patient is having back pain. Has seen a pain management clinic. Currently uses a fentanyl 25mg  patch, but is having break through pain.

## 2011-09-07 NOTE — ED Provider Notes (Signed)
History     CSN: [id number]  Arrival date & time 09/07/11  0208   First MD Initiated Contact with Patient 09/07/11 0359      Chief Complaint  Patient presents with  . Back Pain    (Consider location/radiation/quality/duration/timing/severity/associated sxs/prior treatment) HPI Is a 41 year old black female with a history of rectal cancer status post colectomy and ileostomy. She has a history of chronic pain in her left upper back. She is currently on a pain contract and taking a fentanyl by patch. She has had an episode of breakthrough pain that began yesterday that is severe. It is causing her to have difficulty sleeping. It is a sharp pain, again located in the left upper back. It is somewhat exacerbated with movement. She is requesting breakthrough pain medication until she can contact the pain clinic.  Past Medical History  Diagnosis Date  . Colorectal cancer     colo-rectal ca   . Primary appendiceal adenocarcinoma     Past Surgical History  Procedure Date  . Abdominal surgery   . Ostomy take down   . Rectoperitoneal fistula closure   . Renal artery stent     No family history on file.  History  Substance Use Topics  . Smoking status: Never Smoker   . Smokeless tobacco: Not on file  . Alcohol Use: No    OB History    Grav Para Term Preterm Abortions TAB SAB Ect Mult Living                  Review of Systems  All other systems reviewed and are negative.    Allergies  Compazine  Home Medications   Current Outpatient Rx  Name Route Sig Dispense Refill  . FENTANYL 25 MCG/HR TD PT72 Transdermal Place 1 patch onto the skin every 3 (three) days.    [name] DIAZEPAM 5 MG PO TABS Oral Take 5 mg by mouth once as needed. Anxiety    . IBUPROFEN 200 MG PO TABS Oral Take 800 mg by mouth every 6 (six) hours as needed. For pain      BP 109/72  Pulse 62  Temp(Src) 98.1 F (36.7 C) (Oral)  Resp 20  SpO2 100%  Physical Exam General: Well-developed, well-nourished  female in no acute distress; appearance consistent with age of record HENT: normocephalic, atraumatic Eyes: pupils equal round and reactive to light; extraocular muscles intact Neck: supple Heart: regular rate and rhythm Lungs: clear to auscultation bilaterally Abdomen: soft; nondistended; ileostomy right lower quadrant Back: No deformity; mild tenderness right upper back Extremities: No deformity; full range of motion Neurologic: Awake, alert and oriented; motor function intact in all extremities and symmetric; no facial droop Skin: Warm and dry Psychiatric: Normal mood and affect    ED Course  Procedures (including critical care time)     MDM          Hanley Seamen, MD 09/07/11 4098

## 2011-09-21 ENCOUNTER — Encounter: Payer: Self-pay | Admitting: Hematology and Oncology

## 2011-09-21 NOTE — Progress Notes (Signed)
09/21/2011  I just spoke with Ms. Mayol, RE: Her chest, abd and pelvis ct scans are still in pending status.  I have rescheduled the scans to Thursday, with an arrival time of 9:00 am.  Patient voiced understanding.  Bonita Quin [phone]

## 2011-09-22 ENCOUNTER — Inpatient Hospital Stay (HOSPITAL_COMMUNITY)
Admission: RE | Admit: 2011-09-22 | Discharge: 2011-09-22 | Payer: Self-pay | Source: Ambulatory Visit | Attending: Physician Assistant | Admitting: Physician Assistant

## 2011-09-22 ENCOUNTER — Other Ambulatory Visit: Payer: Self-pay

## 2011-09-23 ENCOUNTER — Encounter: Payer: Self-pay | Admitting: Hematology and Oncology

## 2011-09-23 ENCOUNTER — Other Ambulatory Visit: Payer: Self-pay | Admitting: Hematology and Oncology

## 2011-09-23 DIAGNOSIS — C19 Malignant neoplasm of rectosigmoid junction: Secondary | ICD-10-CM

## 2011-09-23 NOTE — Progress Notes (Signed)
09/23/2011 I left a voicemail for this patient to come in tomorrow for her chest x-ray and abd and pelvis ct scan.  She will report @ 12:00 for the scan.  I left my direct number for [name] to call me back before 5 today or tomorrow morning.  Heather Mayer  [phone]

## 2011-09-23 NOTE — Progress Notes (Signed)
09/23/2011 Good Morning [name], [place] Medicaid has denied this patient's chest, abd and pelvis ct scans.  The reason given: [employer] [facility] Guidelines support Ct Chest, Abdomen and Pelvis with contrast every year for 5 years in the follow-up surveillance of treated colorectal cancer.  I submitted to them the last two clinical notes and the last two sets of scans.  The chest, abd and pelvis ct scans done on 04/16/2011 and abd and pelvis ct scans done 07/21/2011.  I have already rescheduled these scans for tomorrow because of the appt. with you on Friday.   Listed below is the neccessary information if you would like to do a peer-to-peer.   CASE# [id number]  [phone] opt. 4 I await your decision.  Bonita Quin [phone]

## 2011-09-23 NOTE — Progress Notes (Signed)
09/23/2011 I spoke with Ms. Mittman, RE: Chest, Abd and Pelvis CT Scans were cancelled until approved by patient's insurance company.  Ms. Conkle voiced understanding and will discuss with MD during next office visit.  Bonita Quin [phone]

## 2011-09-24 ENCOUNTER — Ambulatory Visit (HOSPITAL_COMMUNITY)
Admission: RE | Admit: 2011-09-24 | Discharge: 2011-09-24 | Disposition: A | Discharge status: Home / Self Care | Payer: Medicaid Other | Source: Ambulatory Visit | Attending: Physician Assistant | Admitting: Physician Assistant

## 2011-09-24 ENCOUNTER — Ambulatory Visit (HOSPITAL_COMMUNITY)
Admission: RE | Admit: 2011-09-24 | Discharge: 2011-09-24 | Disposition: A | Discharge status: Home / Self Care | Payer: Medicaid Other | Source: Ambulatory Visit | Attending: Hematology and Oncology | Admitting: Hematology and Oncology

## 2011-09-24 ENCOUNTER — Inpatient Hospital Stay (HOSPITAL_COMMUNITY): Admission: RE | Admit: 2011-09-24 | Payer: Self-pay | Source: Ambulatory Visit

## 2011-09-24 ENCOUNTER — Encounter: Payer: Self-pay | Admitting: Hematology and Oncology

## 2011-09-24 DIAGNOSIS — Z9221 Personal history of antineoplastic chemotherapy: Secondary | ICD-10-CM | POA: Insufficient documentation

## 2011-09-24 DIAGNOSIS — C19 Malignant neoplasm of rectosigmoid junction: Secondary | ICD-10-CM

## 2011-09-24 DIAGNOSIS — Z933 Colostomy status: Secondary | ICD-10-CM | POA: Insufficient documentation

## 2011-09-24 DIAGNOSIS — K7689 Other specified diseases of liver: Secondary | ICD-10-CM | POA: Insufficient documentation

## 2011-09-24 DIAGNOSIS — C189 Malignant neoplasm of colon, unspecified: Secondary | ICD-10-CM | POA: Insufficient documentation

## 2011-09-24 DIAGNOSIS — C20 Malignant neoplasm of rectum: Secondary | ICD-10-CM

## 2011-09-24 DIAGNOSIS — Z923 Personal history of irradiation: Secondary | ICD-10-CM | POA: Insufficient documentation

## 2011-09-24 MED ORDER — IOHEXOL 300 MG/ML  SOLN
100.0000 mL | Freq: Once | INTRAMUSCULAR | Status: AC | PRN
  Administered 2011-09-24: 100 mL via INTRAVENOUS

## 2011-09-24 NOTE — Progress Notes (Signed)
09/24/2011 I called this patient again this morning just to make sure she had received my message from 09/23/2011.  She confined the message and will proceed to radiology   @ [facility] to drink the water based contrast.  Bonita Quin [phone]

## 2011-09-25 ENCOUNTER — Ambulatory Visit (HOSPITAL_BASED_OUTPATIENT_CLINIC_OR_DEPARTMENT_OTHER): Payer: Medicaid Other | Admitting: Hematology and Oncology

## 2011-09-25 ENCOUNTER — Encounter: Payer: Self-pay | Admitting: Hematology and Oncology

## 2011-09-25 VITALS — BP 112/74 | HR 77 | Temp 97.0°F | Ht 62.5 in | Wt 91.2 lb

## 2011-09-25 DIAGNOSIS — M549 Dorsalgia, unspecified: Secondary | ICD-10-CM

## 2011-09-25 DIAGNOSIS — C20 Malignant neoplasm of rectum: Secondary | ICD-10-CM

## 2011-09-25 NOTE — Progress Notes (Signed)
CC:   Herbie Saxon, MD Currie Paris, [name] Fredderick Severance, MD, Fax [phone]  IDENTIFYING STATEMENT:  The patient is a 41 year old woman with rectal cancer who presents for followup.  INTERVAL HISTORY:  Ms. Carvell notes that rectal discharge has decreased. She also reports that she was diagnosed with right hydronephrosis and had a stent placed by Dr. Kathrynn Running at [facility] on 07/02/2011.  She notes that chronic back pain associated with the symptoms is somewhat persistent and she is currently on fentanyl 25 mcg every 3 days.  At that time she had lost some weight but she has gained some back.  She is still having difficulty with gaining more weight. She had no nausea, vomiting.  She is afebrile.  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  The spleen, gallbladder, biliary system and pancreas are unremarkable. There was a right ureteral stent in place.  There was a right lower quadrant ileostomy.  There was no evidence of bowel obstruction.  There was persistent air collection within the pelvis with dependent fluid level was noted and located in the presacral space, wrapping around the bladder and vaginal cuff.  This was seen to communicate with the vagina which also contained air and measured 7.4 x 6.5 cm.  There were no suspicious osseous findings.  MEDICATIONS:  Reviewed and updated.  ALLERGIES:  Compazine.  Past medical history, family history and social history unchanged.  PHYSICAL EXAMINATION:  General:  The patient is a well-appearing, well- nourished woman in no distress.  Vitals:  Pulse 77, blood pressure 120/74, temperature 97, respirations 20, weight 91.2 pounds.  HEENT: Head is atraumatic, normocephalic.  Sclerae anicteric.  Mouth moist. Chest:  Clear.  CVS:  Unremarkable.  Abdomen:  Soft, nontender. Ileostomy pouch noted with semiformed  stool.  Bowel sounds present. Extremities:  No edema or calf tenderness.  Lymph nodes:  No adenopathy. Port:  No signs of infection.  CNS:  Nonfocal.   Labs on 09/25/2011 notes a white cell count of 4.9, hemoglobin 12.9, hematocrit 38.5, platelets 207.  Chest x-ray on 09/24/2011 showed no evidence of acute cardiopulmonary abnormality.  CMET and CEA level is pending.   IMPRESSION AND PLAN:  Ms. 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  She had also received pelvic external radiation therapy with continuous infusion 5FU between 06/27 and 11/10/2009 followed by adjuvant FOLFOX between 10/22/2009 with Avastin.  However, Avastin was discontinued with 9th cycle due to rectal bleeding.  She completed her 10th cycle on 05/14/2010.  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 [name].  More recently she had ureteral stent placed in the right ureter for hydronephrosis.  She otherwise feels well besides chronic low back pain.  Her staging scans remain stable.  I understand that she follows up with Dr. [name] in 3 months' time at which time he will be getting scans.  I plan to see her in 6 months' time with blood work including a CEA level.     ______________________________ Laurice Record, [name] LIO/MEDQ  D:  09/25/2011  T:  09/25/2011  Job:  [id number]

## 2011-09-25 NOTE — Progress Notes (Signed)
This office note has been dictated.

## 2011-09-25 NOTE — Patient Instructions (Signed)
[place] [employer]  [id number]  [facility] Discharge Instructions  RECOMMENDATIONS MADE BY THE CONSULTANT AND ANY TEST RESULTS WILL BE SENT TO YOUR REFERRING DOCTOR.   EXAM FINDINGS BY MD TODAY AND SIGNS AND SYMPTOMS TO REPORT TO CLINIC OR PRIMARY MD:   Your current list of medications are: Current Outpatient Prescriptions  Medication Sig Dispense Refill  . diazepam (VALIUM) 5 MG tablet Take 5 mg by mouth once as needed. Anxiety      . fentaNYL (DURAGESIC - DOSED MCG/HR) 25 MCG/HR Place 1 patch onto the skin every 3 (three) days.      [name] ibuprofen (ADVIL,MOTRIN) 200 MG tablet Take 800 mg by mouth every 6 (six) hours as needed. For pain       No current facility-administered medications for this visit.   Facility-Administered Medications Ordered in Other Visits  Medication Dose Route Frequency Provider Last Rate Last Dose  . iohexol (OMNIPAQUE) 300 MG/ML solution 100 mL  100 mL Intravenous Once PRN Medication Radiologist, MD   100 mL at 09/24/11 1338     INSTRUCTIONS GIVEN AND DISCUSSED:   SPECIAL INSTRUCTIONS/FOLLOW-UP:  See above.  I acknowledge that I have been informed and understand all the instructions given to me and received a copy. I do not have any more questions at this time, but understand that I may call the [facility] at [phone] during business hours should I have any further questions or need assistance in obtaining follow-up care.

## 2011-09-28 ENCOUNTER — Telehealth: Payer: Self-pay | Admitting: Hematology and Oncology

## 2011-09-28 NOTE — Telephone Encounter (Signed)
lmonvm for pt re appt for 12/6 and 12/11. Schedule mailed today.

## 2011-10-02 ENCOUNTER — Emergency Department (HOSPITAL_COMMUNITY)
Admission: EM | Admit: 2011-10-02 | Discharge: 2011-10-02 | Disposition: A | Discharge status: Home / Self Care | Payer: Medicaid Other | Attending: Emergency Medicine | Admitting: Emergency Medicine

## 2011-10-02 ENCOUNTER — Encounter (HOSPITAL_COMMUNITY): Payer: Self-pay | Admitting: *Deleted

## 2011-10-02 DIAGNOSIS — Z85038 Personal history of other malignant neoplasm of large intestine: Secondary | ICD-10-CM | POA: Insufficient documentation

## 2011-10-02 DIAGNOSIS — G8929 Other chronic pain: Secondary | ICD-10-CM | POA: Insufficient documentation

## 2011-10-02 DIAGNOSIS — M546 Pain in thoracic spine: Secondary | ICD-10-CM | POA: Insufficient documentation

## 2011-10-02 MED ORDER — HYDROCODONE-ACETAMINOPHEN 5-325 MG PO TABS: 1.0000 | ORAL_TABLET | Freq: Four times a day (QID) | ORAL | Status: AC | PRN

## 2011-10-02 MED ORDER — OXYCODONE-ACETAMINOPHEN 5-325 MG PO TABS
1.0000 | ORAL_TABLET | Freq: Once | ORAL | Status: AC
  Administered 2011-10-02: 1 via ORAL
  Filled 2011-10-02: qty 1

## 2011-10-02 NOTE — ED Provider Notes (Signed)
History     CSN: [id number]  Arrival date & time 10/02/11  1849   First MD Initiated Contact with Patient 10/02/11 2020    9:40 PM HPI Patient reports a history of chronic back pain and multiple surgeries colorectal cancer. It dates back pain has been persistent since cancer went into remission. Reports she is prescribed fentanyl patches by Dr. Etta Quill. States she has had MRIs with no acute findings. Points to pain located on the medial sides of bilateral scapula. Denies change in pain. Denies numbness, tingling, weakness, incontinence, abdominal pain, nausea, vomiting, chest pain, SOB, fever.    Patient is a 41 y.o. female presenting with back pain. The history is provided by the patient.  Back Pain  This is a chronic problem. The problem occurs constantly. The problem has been gradually worsening. The quality of the pain is described as aching. The pain does not radiate. The pain is severe. The symptoms are aggravated by bending, twisting and certain positions. Pertinent negatives include no chest pain, no fever, no numbness, no headaches, no abdominal pain, no bowel incontinence, no perianal numbness, no bladder incontinence, no dysuria, no pelvic pain, no leg pain, no paresthesias, no paresis, no tingling and no weakness. She has tried ice, NSAIDs, heat, muscle relaxants, anxiolytics, bed rest and analgesics for the symptoms. The treatment provided mild relief. Risk factors include a history of cancer.    Past Medical History  Diagnosis Date  . Colorectal cancer     colo-rectal ca   . Primary appendiceal adenocarcinoma     Past Surgical History  Procedure Date  . Abdominal surgery   . Ostomy take down   . Rectoperitoneal fistula closure   . Renal artery stent     History reviewed. No pertinent family history.  History  Substance Use Topics  . Smoking status: Never Smoker   . Smokeless tobacco: Not on file  . Alcohol Use: No    OB History    Grav Para Term Preterm  Abortions TAB SAB Ect Mult Living                  Review of Systems  Constitutional: Negative for fever and chills.  HENT: Negative for neck pain and neck stiffness.   Cardiovascular: Negative for chest pain.  Gastrointestinal: Negative for abdominal pain and bowel incontinence.  Genitourinary: Negative for bladder incontinence, dysuria, urgency, frequency, hematuria, flank pain and pelvic pain.  Musculoskeletal: Positive for back pain.       Denies saddle anesthesias, or perineal numbness, urinary or bowel incontinence  Neurological: Negative for tingling, weakness, numbness, headaches and paresthesias.  All other systems reviewed and are negative.    Allergies  Compazine  Home Medications   Current Outpatient Rx  Name Route Sig Dispense Refill  . FENTANYL 25 MCG/HR TD PT72 Transdermal Place 1 patch onto the skin every 3 (three) days.      BP 110/65  Pulse 88  Temp 98.6 F (37 C) (Oral)  Resp 18  Wt 90 lb 2 oz (40.88 kg)  SpO2 100%  Physical Exam  Vitals reviewed. Constitutional: She is oriented to person, place, and time. Vital signs are normal. She appears well-developed and well-nourished. No distress.  HENT:  Head: Normocephalic and atraumatic.  Eyes: Pupils are equal, round, and reactive to light.  Neck: Neck supple.  Pulmonary/Chest: Effort normal.  Abdominal: Soft. She exhibits no distension and no mass. There is no tenderness. There is no rebound and no guarding.  Musculoskeletal:  Thoracic back: She exhibits tenderness. She exhibits normal range of motion, no bony tenderness, no swelling, no edema, no spasm and normal pulse.       Back:  Neurological: She is alert and oriented to person, place, and time.  Skin: Skin is warm and dry. No rash noted. No erythema. No pallor.  Psychiatric: She has a normal mood and affect. Her behavior is normal.    ED Course  Procedures   MDM    Patient has no signs or symptoms of spinal cord compression.  Patient's pain is chronic. Reports that she's doing on her fentanyl patches and her appointment is for another week. Advised patient that we could only get the medication to last until Monday and she should followup sooner with her primary care doctor for further pain medication. Patient has had no changes in her pain so I have a  low suspicion for metastatic cancer however I did recommend patient followup with his physician for further evaluation. Patient voices understanding and is ready for discharge      Thomasene Lot, Cordelia Poche 10/02/11 2147

## 2011-10-02 NOTE — ED Notes (Signed)
Pt c/o breakthrough pain and is out of her fentanyl patches.

## 2011-10-02 NOTE — Discharge Instructions (Signed)
Back Exercises Back exercises help treat and prevent back injuries. The goal of back exercises is to increase the strength of your abdominal and back muscles and the flexibility of your back. These exercises should be started when you no longer have back pain. Back exercises include:  Pelvic Tilt. Lie on your back with your knees bent. Tilt your pelvis until the lower part of your back is against the floor. Hold this position 5 to 10 sec and repeat 5 to 10 times.   Knee to Chest. Pull first 1 knee up against your chest and hold for 20 to 30 seconds, repeat this with the other knee, and then both knees. This may be done with the other leg straight or bent, whichever feels better.   Sit-Ups or Curl-Ups. Bend your knees 90 degrees. Start with tilting your pelvis, and do a partial, slow sit-up, lifting your trunk only 30 to 45 degrees off the floor. Take at least 2 to 3 seconds for each sit-up. Do not do sit-ups with your knees out straight. If partial sit-ups are difficult, simply do the above but with only tightening your abdominal muscles and holding it as directed.   Hip-Lift. Lie on your back with your knees flexed 90 degrees. Push down with your feet and shoulders as you raise your hips a couple inches off the floor; hold for 10 seconds, repeat 5 to 10 times.   Back arches. Lie on your stomach, propping yourself up on bent elbows. Slowly press on your hands, causing an arch in your low back. Repeat 3 to 5 times. Any initial stiffness and discomfort should lessen with repetition over time.   Shoulder-Lifts. Lie face down with arms beside your body. Keep hips and torso pressed to floor as you slowly lift your head and shoulders off the floor.  Do not overdo your exercises, especially in the beginning. Exercises may cause you some mild back discomfort which lasts for a few minutes; however, if the pain is more severe, or lasts for more than 15 minutes, do not continue exercises until you see your  caregiver. Improvement with exercise therapy for back problems is slow.  See your caregivers for assistance with developing a proper back exercise program. Document Released: 05/07/2004 Document Revised: 03/19/2011 Document Reviewed: 03/30/2005 [facility] Patient Information 2012 [place], [place].Acupuncture Acupuncture began in [place] some 3,000 years ago. It has been widely used in [place] since the early 1900s to relieve pain. A growing number of doctors find this method to be useful but cannot medically explain how it works. Patients are asking about acupuncture more often, especially when standard treatments have not made their pain go away. The theory that, in part, acupuncture works through the release of brain neurotransmitters is gaining scientific support. Some evidence shows that acupuncture causes a release of endorphins in the brain. Endorphins are chemicals the body produces to feel good or to feel less pain. PROCEDURE To treat pain, caregivers choose exact points to place needles. Points are chosen from within the same nerve (neural) segment next to the area of pain. Points may also be chosen from the arms and legs where many muscles are located. One of the most effective acupuncture points, Hoku, is located at the base of the thumb. The thumb has the largest number of connections to the brain. TREATMENT Chronic Back Pain When back pain lasts longer than 3 months, it is called chronic back pain.This pain can be frustrating, but the cause of the pain is rarely dangerous.People with  chronic back pain often go through certain periods that are more intense (flare-ups). CAUSES Chronic back pain can be caused by wear and tear (degeneration) on different structures in your back. These structures may include bones, ligaments, or discs. This degeneration may result in more pressure being placed on the nerves that travel to your legs and feet. This can lead to pain traveling from the low back down the  back of the legs. When pain lasts longer than 3 months, it is not unusual for people to experience anxiety or depression. Anxiety and depression can also contribute to low back pain. TREATMENT  Establish a regular exercise plan. This is critical to improving your functional level.   Have a self-management plan for when you flare-up. Flare-ups rarely require a medical visit. Regular exercise will help reduce the intensity and frequency of your flare-ups.   Manage how you feel about your back pain and the rest of your life. Anxiety, depression, and feeling that you cannot alter your back pain have been shown to make back pain more intense and debilitating.   Medicines should never be your only treatment. They should be used along with other treatments to help you return to a more active lifestyle.   Procedures such as injections or surgery may be helpful but are rarely necessary. You may be able to get the same results with physical therapy or chiropractic care.  HOME CARE INSTRUCTIONS  Avoid bending, heavy lifting, prolonged sitting, and activities which make the problem worse.   Continue normal activity as much as possible.   Take brief periods of rest throughout the day to reduce your pain during flare-ups.   Follow your back exercise rehabilitation program. This can help reduce symptoms and prevent more pain.   Only take over-the-counter or prescription medicines as directed by your caregiver. Muscle relaxants are sometimes prescribed. Narcotic pain medicine is discouraged for long-term pain, since addiction is a possible outcome.   If you smoke, quit.   Eat healthy foods and maintain a recommended body weight.  SEEK IMMEDIATE MEDICAL CARE IF:   You have weakness or numbness in one of your legs or feet.   You have trouble controlling your bladder or bowels.   You develop nausea, vomiting, abdominal pain, shortness of breath, or fainting.  Document Released: 05/07/2004 Document  Revised: 03/19/2011 Document Reviewed: 03/14/2011 [facility] Patient Information 2012 [place], [place].  Acupuncture needles are placed deeply into a muscle. This causes changes in the central nervous system that make pain go away. The relief from pain lasts a long time.   Other forms of treatment such as moxibustion may be used at the acupuncture point. This is a form of heat therapy.   A variety of massage and movement techniques may also be used to relieve pain.  [employer] [facility]) Document Released: 04/02/2003 Document Revised: 03/19/2011 Document Reviewed: 09/13/2008 [facility] Patient Information 2012 [place], [place].

## 2011-10-03 NOTE — ED Provider Notes (Signed)
Medical screening examination/treatment/procedure(s) were performed by non-physician practitioner and as supervising physician I was immediately available for consultation/collaboration.   [name], MD 10/03/11 0036 

## 2011-10-06 ENCOUNTER — Encounter (HOSPITAL_BASED_OUTPATIENT_CLINIC_OR_DEPARTMENT_OTHER): Payer: Self-pay | Admitting: *Deleted

## 2011-10-06 ENCOUNTER — Emergency Department (HOSPITAL_BASED_OUTPATIENT_CLINIC_OR_DEPARTMENT_OTHER)
Admission: EM | Admit: 2011-10-06 | Discharge: 2011-10-06 | Disposition: A | Discharge status: Home / Self Care | Payer: Medicaid Other | Attending: Emergency Medicine | Admitting: Emergency Medicine

## 2011-10-06 DIAGNOSIS — Z85038 Personal history of other malignant neoplasm of large intestine: Secondary | ICD-10-CM | POA: Insufficient documentation

## 2011-10-06 DIAGNOSIS — G8929 Other chronic pain: Secondary | ICD-10-CM | POA: Insufficient documentation

## 2011-10-06 MED ORDER — HYDROCODONE-ACETAMINOPHEN 5-325 MG PO TABS: 2.0000 | ORAL_TABLET | ORAL | Status: AC | PRN

## 2011-10-06 NOTE — ED Notes (Signed)
PA and nurse spoke with patient at great length that ED will not be prescribing narcotics for chronic pain since pt is under contract with pain management clinic. Instructed pt to have pain management MD write a letter stating what medications pt is to receive when coming to ED. Pt verbalized understanding and agreed to discuss this with her pain management doctor at her apt on 10/09/11.

## 2011-10-06 NOTE — Discharge Instructions (Signed)
Pain Management, Chronic You have a painful condition that has required frequent use of narcotic-type pain medicine. We would like to see that you receive the best possible care for your problem. To achieve this, you must have a personal physician who can supervise a treatment plan for you. You may locate a personal physician on your own or contact one of the doctors whose name has been given to you. If your physician determines that you need to visit the Emergency Department for pain control, that doctor should provide you with a pain contract. This is a letter from your doctor which describes what pain medicine you may receive, how much and how often. You sign it agreeing to the terms of the treatment plan. Bring this with each time you come to this facility. It will help the Emergency Physician provide the proper treatment for you with minimal delay. Please Note: In the future you may not be able to receive narcotic pain medicine from this facility without a pain contract or telephone approval from your personal physician. Document Released: 11/21/2003 Document Revised: 03/19/2011 Document Reviewed: 03/26/2008 [employer] Patient Information 2012 [employer]. 

## 2011-10-06 NOTE — ED Notes (Signed)
Pt was ambulatory to discharge without difficulty with a swift and steady gait.

## 2011-10-06 NOTE — ED Notes (Signed)
Back pain. States she has had the same type pain in her back for a year.

## 2011-10-06 NOTE — ED Notes (Signed)
Pt seen at [facility] 6/21 for same and given prescription of vicodin.

## 2011-10-06 NOTE — ED Notes (Signed)
Pt states she has taken all 15 vicodin that she was prescribed on 10/02/11.

## 2011-10-06 NOTE — ED Provider Notes (Signed)
History     CSN: [id number]  Arrival date & time 10/06/11  2011   First MD Initiated Contact with Patient 10/06/11 2052      Chief Complaint  Patient presents with  . Back Pain    (Consider location/radiation/quality/duration/timing/severity/associated sxs/prior treatment) Patient is a 41 y.o. female presenting with back pain. The history is provided by the patient. No language interpreter was used.  Back Pain  This is a chronic problem. The problem occurs constantly. The pain is present in the lumbar spine. The pain is the same all the time. Stiffness is present all day.   patient complains of constant chronic pain. Patient is currently being followed by pain management and Dr. [name] as well as [facility]. Patient is on fentanyl patches for pain. Patient was seen on 6/21 for the same and prescribed hydrocodone. Patient is reporting that her pain management doctor will not see her until next week. Patient has had multiple emergency department visits for pain control. Patient has a history of colon cancer.    Past Medical History  Diagnosis Date  . Colorectal cancer     colo-rectal ca   . Primary appendiceal adenocarcinoma     Past Surgical History  Procedure Date  . Abdominal surgery   . Ostomy take down   . Rectoperitoneal fistula closure   . Renal artery stent     No family history on file.  History  Substance Use Topics  . Smoking status: Never Smoker   . Smokeless tobacco: Not on file  . Alcohol Use: No    OB History    Grav Para Term Preterm Abortions TAB SAB Ect Mult Living                  Review of Systems  Musculoskeletal: Positive for back pain.  All other systems reviewed and are negative.    Allergies  Compazine  Home Medications   Current Outpatient Rx  Name Route Sig Dispense Refill  . FENTANYL 25 MCG/HR TD PT72 Transdermal Place 1 patch onto the skin every 3 (three) days.    [name] HYDROCODONE-ACETAMINOPHEN 5-325 MG PO TABS Oral Take  1 tablet by mouth every 6 (six) hours as needed for pain. 15 tablet 0  . HYDROCODONE-ACETAMINOPHEN 5-325 MG PO TABS Oral Take 2 tablets by mouth every 4 (four) hours as needed for pain. 16 tablet 0    BP 107/73  Pulse 97  Temp 98 F (36.7 C) (Oral)  Resp 20  SpO2 100%  Physical Exam  Nursing note and vitals reviewed. Constitutional: She is oriented to person, place, and time. She appears well-developed.  HENT:  Head: Normocephalic.  Eyes: Pupils are equal, round, and reactive to light.  Neck: Normal range of motion.  Cardiovascular: Normal rate and normal heart sounds.   Pulmonary/Chest: Effort normal.  Abdominal: Soft.  Musculoskeletal: Normal range of motion.       Tender ls spine diffusely  Neurological: She is alert and oriented to person, place, and time. She has normal reflexes.  Skin: Skin is warm and dry.  Psychiatric:       Agitated,     ED Course  Procedures (including critical care time)  Labs Reviewed - No data to display No results found.   1. Chronic pain     I advised pt I would give her a prescription for 16 hydrocodone.   I advised pt she needs to talk to her primary MD or her pain management physician about  narcotic pain management. Pt denies having a pain management contract although she is treated by pain management.  I questioned pt about possible abuse and she denies.  (Pt complains of being offended and I explained that I ask so that if she does have an abuse problem that we could get her help)     MDM  I had charge RN go with me to talk to pt.  I advised her that I reviewed records and that she needs to limit her pain medications to the medicines she is prescribed by pain management.   Pt given information about chronic pain         Lonia Skinner [place], [place] 10/06/11 2154  Lonia Skinner [place], [place] 10/06/11 2202

## 2011-10-06 NOTE — ED Notes (Signed)
Also explained to pt if she continued to receive multiple prescriptions for narcotic medication through multiple providers, she will be discharged from the pain clinic.

## 2011-10-07 NOTE — ED Provider Notes (Signed)
Medical screening examination/treatment/procedure(s) were performed by non-physician practitioner and as supervising physician I was immediately available for consultation/collaboration.   Maudean Hoffmann, MD 10/07/11 1725 

## 2012-01-08 ENCOUNTER — Encounter (HOSPITAL_BASED_OUTPATIENT_CLINIC_OR_DEPARTMENT_OTHER): Payer: Self-pay | Admitting: *Deleted

## 2012-01-08 ENCOUNTER — Emergency Department (HOSPITAL_BASED_OUTPATIENT_CLINIC_OR_DEPARTMENT_OTHER)
Admission: EM | Admit: 2012-01-08 | Discharge: 2012-01-08 | Disposition: A | Discharge status: Home / Self Care | Payer: Medicare Other | Attending: Emergency Medicine | Admitting: Emergency Medicine

## 2012-01-08 DIAGNOSIS — T18108A Unspecified foreign body in esophagus causing other injury, initial encounter: Secondary | ICD-10-CM | POA: Insufficient documentation

## 2012-01-08 DIAGNOSIS — Z85038 Personal history of other malignant neoplasm of large intestine: Secondary | ICD-10-CM | POA: Insufficient documentation

## 2012-01-08 DIAGNOSIS — T17208A Unspecified foreign body in pharynx causing other injury, initial encounter: Secondary | ICD-10-CM

## 2012-01-08 DIAGNOSIS — IMO0002 Reserved for concepts with insufficient information to code with codable children: Secondary | ICD-10-CM | POA: Insufficient documentation

## 2012-01-08 NOTE — ED Notes (Signed)
Pt. Was given H2O and is able to swallow with no drooling or difficulty.  Pt. Reports history of chemo and radiation for colo-rectal cancer.  Pt. Is in no resp. Distress.

## 2012-01-08 NOTE — ED Notes (Signed)
Patient is resting comfortably. 

## 2012-01-08 NOTE — ED Notes (Signed)
States she took a Motrin 800mg  15 mins ago and it got stuck in her throat. Was afraid she was going to choke so she came here. Able to speak in complete sentences. No respiratory distress.

## 2012-01-08 NOTE — ED Provider Notes (Signed)
History     CSN: [id number]  Arrival date & time 01/08/12  1534   First MD Initiated Contact with Patient 01/08/12 1557      Chief Complaint  Patient presents with  . Foreign Body    (Consider location/radiation/quality/duration/timing/severity/associated sxs/prior treatment) HPI Comments: Patient presents with pill stuck in her throat. She took an 800 mg tablet of ibuprofen 30 minutes ago for a headache and felt it get stuck in her throat. She tried drinking water and chewing bread to dislodge the pill however it did not help. She became worried that it would not dissolve on its own so she came to the ED. While here she was able to drink some water and successfully dislodged the pill from her throat. Patient denies a history of difficulty swallowing liquids or solids. Denies difficulty breathing or drooling. Onset acute. Course resolved.   Patient is a 41 y.o. female presenting with foreign body. The history is provided by the patient.  Foreign Body  Associated symptoms include trouble swallowing. Pertinent negatives include no vomiting, no drooling and no choking.    Past Medical History  Diagnosis Date  . Colorectal cancer     colo-rectal ca   . Primary appendiceal adenocarcinoma     Past Surgical History  Procedure Date  . Abdominal surgery   . Ostomy take down   . Rectoperitoneal fistula closure   . Renal artery stent     No family history on file.  History  Substance Use Topics  . Smoking status: Never Smoker   . Smokeless tobacco: Not on file  . Alcohol Use: No    OB History    Grav Para Term Preterm Abortions TAB SAB Ect Mult Living                  Review of Systems  HENT: Positive for trouble swallowing. Negative for drooling.   Respiratory: Negative for choking and shortness of breath.   Gastrointestinal: Negative for nausea and vomiting.    Allergies  Compazine  Home Medications   Current Outpatient Rx  Name Route Sig Dispense Refill  .  FENTANYL 25 MCG/HR TD PT72 Transdermal Place 1 patch onto the skin every 3 (three) days.      BP 114/86  Pulse 104  Temp 98 F (36.7 C) (Oral)  Resp 20  SpO2 100%  Physical Exam  Nursing note and vitals reviewed. Constitutional: She appears well-developed and well-nourished.  HENT:  Head: Normocephalic and atraumatic.  Mouth/Throat: Uvula is midline, oropharynx is clear and moist and mucous membranes are normal. Mucous membranes are not dry.       Swallows water without difficulty, no difficulty handling secretions.   Eyes: Conjunctivae normal are normal.  Neck: Normal range of motion. Neck supple.  Pulmonary/Chest: Effort normal and breath sounds normal. No stridor. No respiratory distress. She has no wheezes.       No stridor  Neurological: She is alert.  Skin: Skin is warm and dry.  Psychiatric: She has a normal mood and affect.    ED Course  Procedures (including critical care time)  Labs Reviewed - No data to display No results found.   1. Foreign body in throat     4:33 PM Patient seen and examined. Symptoms resolved. Urged PCP follow-up if she continues to have trouble swallowing.    Vital signs reviewed and are as follows: Filed Vitals:   01/08/12 1539  BP: 114/86  Pulse: 104  Temp: 98 F (36.7 C)  Resp: 20     MDM  Patient with FB in throat, resolved. Appears well. Tolerating PO's without difficulty.         Renne Crigler, [place] 01/08/12 [phone]

## 2012-01-08 NOTE — ED Provider Notes (Signed)
Medical screening examination/treatment/procedure(s) were performed by non-physician practitioner and as supervising physician I was immediately available for consultation/collaboration.   Charles B. Bernette Mayers, MD 01/08/12 2130

## 2012-03-01 ENCOUNTER — Telehealth: Payer: Self-pay | Admitting: Hematology and Oncology

## 2012-03-01 NOTE — Telephone Encounter (Signed)
Pt stopped by and changed her lab appt ans a new sch was printed for the pt

## 2012-03-18 ENCOUNTER — Other Ambulatory Visit: Payer: Medicaid Other | Admitting: Lab

## 2012-03-18 ENCOUNTER — Other Ambulatory Visit (HOSPITAL_BASED_OUTPATIENT_CLINIC_OR_DEPARTMENT_OTHER): Payer: Medicaid Other | Admitting: Lab

## 2012-03-18 DIAGNOSIS — C20 Malignant neoplasm of rectum: Secondary | ICD-10-CM

## 2012-03-18 LAB — COMPREHENSIVE METABOLIC PANEL (CC13)
ALT: 9 U/L (ref 0–55)
Albumin: 3.4 g/dL — ABNORMAL LOW (ref 3.5–5.0)
Alkaline Phosphatase: 118 U/L (ref 40–150)
CO2: 29 mEq/L (ref 22–29)
Glucose: 97 mg/dl (ref 70–99)
Potassium: 3.7 mEq/L (ref 3.5–5.1)
Sodium: 142 mEq/L (ref 136–145)
Total Protein: 7.5 g/dL (ref 6.4–8.3)

## 2012-03-18 LAB — CBC WITH DIFFERENTIAL/PLATELET
Basophils Absolute: 0 10*3/uL (ref 0.0–0.1)
Eosinophils Absolute: 0.1 10*3/uL (ref 0.0–0.5)
HCT: 35.8 % (ref 34.8–46.6)
MCH: 25.3 pg (ref 25.1–34.0)
MCV: 82.3 fL (ref 79.5–101.0)
Platelets: 327 10*3/uL (ref 145–400)
RDW: 15 % — ABNORMAL HIGH (ref 11.2–14.5)
lymph#: 2 10*3/uL (ref 0.9–3.3)

## 2012-03-19 LAB — CEA: CEA: 3.8 ng/mL (ref 0.0–5.0)

## 2012-03-23 ENCOUNTER — Ambulatory Visit: Payer: Medicare Other

## 2012-03-23 ENCOUNTER — Ambulatory Visit (HOSPITAL_BASED_OUTPATIENT_CLINIC_OR_DEPARTMENT_OTHER): Payer: Medicare Other | Admitting: Hematology and Oncology

## 2012-03-23 ENCOUNTER — Telehealth: Payer: Self-pay | Admitting: Hematology and Oncology

## 2012-03-23 ENCOUNTER — Encounter: Payer: Self-pay | Admitting: Hematology and Oncology

## 2012-03-23 VITALS — BP 127/54 | HR 69 | Temp 96.8°F | Resp 18

## 2012-03-23 VITALS — BP 103/69 | HR 65 | Temp 97.4°F | Resp 20 | Ht 62.25 in | Wt 97.4 lb

## 2012-03-23 DIAGNOSIS — C19 Malignant neoplasm of rectosigmoid junction: Secondary | ICD-10-CM

## 2012-03-23 DIAGNOSIS — C189 Malignant neoplasm of colon, unspecified: Secondary | ICD-10-CM

## 2012-03-23 DIAGNOSIS — C20 Malignant neoplasm of rectum: Secondary | ICD-10-CM

## 2012-03-23 MED ORDER — SODIUM CHLORIDE 0.9 % IJ SOLN
10.0000 mL | INTRAMUSCULAR | Status: DC | PRN
  Administered 2012-03-23: 10 mL via INTRAVENOUS
  Filled 2012-03-23: qty 10

## 2012-03-23 MED ORDER — HEPARIN SOD (PORK) LOCK FLUSH 100 UNIT/ML IV SOLN
500.0000 [IU] | Freq: Once | INTRAVENOUS | Status: AC
  Administered 2012-03-23: 500 [IU] via INTRAVENOUS
  Filled 2012-03-23: qty 5

## 2012-03-23 NOTE — Patient Instructions (Addendum)
Keane Police  [id number]   [facility] - AFTER VISIT SUMMARY   **RECOMMENDATIONS MADE BY THE CONSULTANT AND ANY TEST    RESULTS WILL BE SENT TO YOUR REFERRING DOCTORS.   YOUR EXAM FINDINGS, LABS AND RESULTS WERE DISCUSSED BY YOUR MD TODAY.  YOU CAN GO TO THE  WEB SITE FOR INSTRUCTIONS ON HOW TO ASSESS MY CHART FOR ADDITIONAL INFORMATION AS NEEDED.  Your Updated drug allergies are: Allergies as of 03/23/2012 - Review Complete 03/23/2012  Allergen Reaction Noted  . Compazine Anaphylaxis 06/24/2010    Your current list of medications are: Current Outpatient Prescriptions  Medication Sig Dispense Refill  . fentaNYL (DURAGESIC - DOSED MCG/HR) 25 MCG/HR Place 1 patch onto the skin every 3 (three) days.      . ondansetron (ZOFRAN) 4 MG tablet Take 4 mg by mouth as needed.      . traMADol (ULTRAM) 50 MG tablet Take 50 mg by mouth 2 (two) times daily as needed.         INSTRUCTIONS GIVEN AND DISCUSSED:  See attached schedule   SPECIAL INSTRUCTIONS/FOLLOW-UP:  See above.  I acknowledge that I have been informed and understand all the instructions given to me and received a copy.I know to contact the clinic, my physician, or go to the emergency Department if any problems should occur.   I do not have any more questions at this time, but understand that I may call the [facility] at [phone] during business hours should I have any further questions or need assistance in obtaining follow-up care.

## 2012-03-23 NOTE — Telephone Encounter (Signed)
Gave pt apt for lab and MD on September 2014, lab on February 2014, pt was sent to flush room today per Morey Hummingbird, RN

## 2012-03-23 NOTE — Progress Notes (Signed)
This office note has been dictated.

## 2012-03-24 NOTE — Progress Notes (Signed)
IDENTIFYING STATEMENT:  The patient is a 41 year old woman with rectal adenocarcinoma who presents for followup.  INTERVAL HISTORY:  The patient was last seen 9 months ago.  He feels well.  He has had no issues or concerns.  Weight is stable.  Denies abdominal pain.  She has good energy levels.  MEDICATIONS:  Reviewed and updated.  ALLERGIES:  None.  PHYSICAL EXAM:  General: The patient is alert and oriented x3.  Vitals: Pulse 65, blood pressure 103/69, temperature 97.4, respirations 20. Weight 97.4 pounds.  HEENT:  Head is atraumatic, normocephalic.  Sclerae anicteric.  Mouth moist.  Neck:  Supple.  Chest:  Clear.  Port:  No signs of infection.  Cardiovascular:  First and second heart sounds present.  Abdomen:  Soft, nontender.  Bowel sounds present.  Right-sided ostomy with no stool present in drainage bag.  Extremities:  No edema.  LAB DATA:  03/18/2012, white cell count 4.9, hemoglobin 11, hematocrit 35.8, platelets 327, sodium 142, potassium 3.7 chloride 104, CO2 29, BUN 9, creatinine 0.8, glucose 97.  CEA 3.8.  IMPRESSION AND PLAN:  Ms. Record is a 41 year old woman with rectal cancer status post pelvic exoneration including rectal resection with total abdominal hysterectomy, bilateral salpingo-oophorectomy, omentectomy and immobilization of splenic flexure as well as receiving intraperitoneal hypothermia with chemotherapy (Mitomycin-C), and loop ileostomy.  On August 08, 2009 at [facility] for perforated mucinous adenocarcinoma of the rectum.  None of 17 lymph nodes sampled had evidence of disease.  She then went on to receive pelvic external radiation therapy with continuous infusion of 5- FU between June 27th and November 10, 2009 followed by adjuvant FOLFOX6 from 10/22/2009 with Avastin.  The Avastin was discontinued with 9th cycle due to rectal bleeding.  She completed her 10th cycle on 05/14/2010. 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 Herbie Saxon.  He sees her  in three months time with scans. Her exam and labs are unremarkable.   She will follow up in 9 months' time.    ______________________________ Laurice Record, [name] LIO/MEDQ  D:  03/23/2012  T:  03/24/2012  Job:  [id number]

## 2012-04-12 ENCOUNTER — Encounter (HOSPITAL_COMMUNITY): Payer: Self-pay | Admitting: Emergency Medicine

## 2012-04-12 ENCOUNTER — Ambulatory Visit (HOSPITAL_COMMUNITY): Payer: Medicare Other

## 2012-04-12 ENCOUNTER — Emergency Department (HOSPITAL_COMMUNITY)
Admission: EM | Admit: 2012-04-12 | Discharge: 2012-04-12 | Disposition: A | Discharge status: Home / Self Care | Payer: Medicare Other | Attending: Emergency Medicine | Admitting: Emergency Medicine

## 2012-04-12 DIAGNOSIS — M549 Dorsalgia, unspecified: Secondary | ICD-10-CM | POA: Insufficient documentation

## 2012-04-12 DIAGNOSIS — R630 Anorexia: Secondary | ICD-10-CM | POA: Insufficient documentation

## 2012-04-12 DIAGNOSIS — N939 Abnormal uterine and vaginal bleeding, unspecified: Secondary | ICD-10-CM

## 2012-04-12 DIAGNOSIS — R51 Headache: Secondary | ICD-10-CM | POA: Insufficient documentation

## 2012-04-12 DIAGNOSIS — N898 Other specified noninflammatory disorders of vagina: Secondary | ICD-10-CM | POA: Insufficient documentation

## 2012-04-12 DIAGNOSIS — Z85038 Personal history of other malignant neoplasm of large intestine: Secondary | ICD-10-CM | POA: Insufficient documentation

## 2012-04-12 DIAGNOSIS — R5381 Other malaise: Secondary | ICD-10-CM | POA: Insufficient documentation

## 2012-04-12 DIAGNOSIS — R109 Unspecified abdominal pain: Secondary | ICD-10-CM | POA: Insufficient documentation

## 2012-04-12 LAB — URINALYSIS, ROUTINE W REFLEX MICROSCOPIC
Glucose, UA: NEGATIVE mg/dL
Protein, ur: NEGATIVE mg/dL
Specific Gravity, Urine: 1.019 (ref 1.005–1.030)
Urobilinogen, UA: 0.2 mg/dL (ref 0.0–1.0)

## 2012-04-12 LAB — CBC WITH DIFFERENTIAL/PLATELET
Basophils Relative: 0 % (ref 0–1)
Eosinophils Absolute: 0.1 10*3/uL (ref 0.0–0.7)
Eosinophils Relative: 1 % (ref 0–5)
HCT: 33.2 % — ABNORMAL LOW (ref 36.0–46.0)
Hemoglobin: 10.3 g/dL — ABNORMAL LOW (ref 12.0–15.0)
MCH: 24.8 pg — ABNORMAL LOW (ref 26.0–34.0)
MCHC: 31 g/dL (ref 30.0–36.0)
MCV: 80 fL (ref 78.0–100.0)
Monocytes Absolute: 0.6 10*3/uL (ref 0.1–1.0)
Monocytes Relative: 8 % (ref 3–12)
Neutro Abs: 5.1 10*3/uL (ref 1.7–7.7)
RDW: 15.3 % (ref 11.5–15.5)

## 2012-04-12 LAB — COMPREHENSIVE METABOLIC PANEL
Albumin: 3.1 g/dL — ABNORMAL LOW (ref 3.5–5.2)
BUN: 7 mg/dL (ref 6–23)
Calcium: 10 mg/dL (ref 8.4–10.5)
Chloride: 96 mEq/L (ref 96–112)
Creatinine, Ser: 0.53 mg/dL (ref 0.50–1.10)
Total Bilirubin: 0.3 mg/dL (ref 0.3–1.2)

## 2012-04-12 LAB — WET PREP, GENITAL: Yeast Wet Prep HPF POC: NONE SEEN

## 2012-04-12 LAB — URINE MICROSCOPIC-ADD ON

## 2012-04-12 MED ORDER — SODIUM CHLORIDE 0.9 % IV SOLN
Freq: Once | INTRAVENOUS | Status: AC
  Administered 2012-04-12: 20 mL/h via INTRAVENOUS

## 2012-04-12 MED ORDER — IOHEXOL 300 MG/ML  SOLN
80.0000 mL | Freq: Once | INTRAMUSCULAR | Status: AC | PRN
  Administered 2012-04-12: 80 mL via INTRAVENOUS

## 2012-04-12 MED ORDER — OXYCODONE-ACETAMINOPHEN 10-650 MG PO TABS: 1.0000 | ORAL_TABLET | Freq: Four times a day (QID) | ORAL | Status: DC | PRN

## 2012-04-12 MED ORDER — MORPHINE SULFATE 4 MG/ML IJ SOLN
4.0000 mg | Freq: Once | INTRAMUSCULAR | Status: AC
  Administered 2012-04-12: 4 mg via INTRAVENOUS
  Filled 2012-04-12: qty 1

## 2012-04-12 MED ORDER — ONDANSETRON HCL 4 MG/2ML IJ SOLN
4.0000 mg | INTRAMUSCULAR | Status: AC
  Administered 2012-04-12: 4 mg via INTRAVENOUS
  Filled 2012-04-12: qty 2

## 2012-04-12 MED ORDER — ONDANSETRON HCL 4 MG PO TABS: 4.0000 mg | ORAL_TABLET | Freq: Four times a day (QID) | ORAL | Status: DC

## 2012-04-12 MED ORDER — SODIUM CHLORIDE 0.9 % IV BOLUS (SEPSIS)
1000.0000 mL | Freq: Once | INTRAVENOUS | Status: AC
  Administered 2012-04-12: 1000 mL via INTRAVENOUS

## 2012-04-12 MED ORDER — HEPARIN SOD (PORK) LOCK FLUSH 100 UNIT/ML IV SOLN
INTRAVENOUS | Status: AC
  Administered 2012-04-12: 21:00:00
  Filled 2012-04-12: qty 5

## 2012-04-12 NOTE — ED Notes (Signed)
Per EMS, generalized weakness x 1 week, worse last night and today, abd pain since last night with N/V this am.  Emesis x 1 this am, lower abd tenderness. BP 108/71 HR 86 O2 100%  CBG 91

## 2012-04-12 NOTE — ED Notes (Signed)
IV RN notified of need for port access.

## 2012-04-12 NOTE — ED Provider Notes (Signed)
History     CSN: [id number]  Arrival date & time 04/12/12  1254   First MD Initiated Contact with Patient 04/12/12 1326      Chief Complaint  Patient presents with  . Nausea  . Emesis    (Consider location/radiation/quality/duration/timing/severity/associated sxs/prior treatment) The history is provided by the patient and medical records.    Heather Mayer is a 41 y.o. female  with a hx of abdominal surgery, colon cancer, primary appendiceal adenocarcinoma presents to the Emergency Department complaining of gradual, persistent, progressively worsening abdominal pain onset 3 days ago.  Pain is described as cramping, intermittent, rated at a 8/10, located in the LLQ and in the middle, it radiates to her back, but patient also has chronic back pain.  Pt also with generalized weakness, fatigue, decrease in appetite and malaise x 1 week.  Pt states she vomited x1 this morning. Pt with colostomy but states normal BMs into the colostomy.  Pt also c/o vaginal bleeding x 3 days.  Pt s/p total hysterectomy in April 2011.  A few episodes of vaginal bleeding since then, but nothing this heavy.  Associated symptoms include abdominal pain, nausea, vomiting, general weakness, headache, back pain.  Nothing makes it better and nothing makes it worse.  Pt denies diarrhea, fever, chills, neck pain, chest pain, shortness of breath, syncope.  History of rectovaginal fistula.  Patient sees Dr. Herbie Saxon, at [facility] in the conference the cancer Center.   Past Medical History  Diagnosis Date  . Colorectal cancer     colo-rectal ca   . Primary appendiceal adenocarcinoma     Past Surgical History  Procedure Date  . Abdominal surgery   . Ostomy take down   . Rectoperitoneal fistula closure   . Renal artery stent     History reviewed. No pertinent family history.  History  Substance Use Topics  . Smoking status: Never Smoker   . Smokeless tobacco: Not on file  . Alcohol Use: No    OB  History    Grav Para Term Preterm Abortions TAB SAB Ect Mult Living                  Review of Systems  Constitutional: Positive for appetite change and fatigue. Negative for fever, diaphoresis and unexpected weight change.  HENT: Negative for mouth sores, trouble swallowing, neck pain and neck stiffness.   Eyes: Negative for photophobia and visual disturbance.  Respiratory: Negative for cough, chest tightness, shortness of breath, wheezing and stridor.   Cardiovascular: Negative for chest pain and palpitations.  Gastrointestinal: Positive for nausea, vomiting and abdominal pain. Negative for diarrhea, constipation, blood in stool, abdominal distention and rectal pain.  Genitourinary: Negative for dysuria, urgency, frequency, hematuria, flank pain and difficulty urinating.  Musculoskeletal: Positive for back pain.  Skin: Negative for rash.  Neurological: Positive for weakness and headaches.  Hematological: Negative for adenopathy.  Psychiatric/Behavioral: Negative for confusion.  All other systems reviewed and are negative.    Allergies  Compazine  Home Medications   Current Outpatient Rx  Name  Route  Sig  Dispense  Refill  . FENTANYL 25 MCG/HR TD PT72   Transdermal   Place 1 patch onto the skin every 3 (three) days.         [name] ONDANSETRON HCL 4 MG PO TABS   Oral   Take 4 mg by mouth as needed. Nausea and vomiting         . TRAMADOL HCL 50 MG PO TABS  Oral   Take 50 mg by mouth 2 (two) times daily as needed. pain           BP 101/51  Pulse 77  Temp 98 F (36.7 C) (Oral)  Resp 16  SpO2 100%  Physical Exam  Nursing note and vitals reviewed. Constitutional: She is oriented to person, place, and time. She appears well-developed and well-nourished.  HENT:  Head: Normocephalic and atraumatic.  Mouth/Throat: Oropharynx is clear and moist.  Eyes: Conjunctivae normal are normal. Pupils are equal, round, and reactive to light. No scleral icterus.  Neck: Normal  range of motion.  Cardiovascular: Normal rate, regular rhythm and intact distal pulses.  Exam reveals no gallop and no friction rub.   No murmur heard. Pulmonary/Chest: Effort normal and breath sounds normal. No respiratory distress. She has no wheezes. She has no rales. She exhibits no tenderness.  Abdominal: Soft. Bowel sounds are normal. She exhibits no distension and no mass. There is tenderness in the suprapubic area and left lower quadrant. There is guarding. There is no rigidity, no rebound, no CVA tenderness, no tenderness at McBurney's point and negative Murphy's sign.  Genitourinary: There is no rash, tenderness, lesion or injury on the right labia. There is no rash, tenderness, lesion or injury on the left labia.       Well-healed hysterectomy scar Manson Passey tinged mucus noted in the vaginal vault Small amount of bright red blood located in the vaginal vault No lesions seen Difficult exam as radiation has made extremely painful for the patient  Musculoskeletal: Normal range of motion. She exhibits no tenderness.  Lymphadenopathy:    She has no cervical adenopathy.  Neurological: She is alert and oriented to person, place, and time. She exhibits normal muscle tone. Coordination normal.  Skin: Skin is warm and dry. No rash noted.  Psychiatric: She has a normal mood and affect.    ED Course  Procedures (including critical care time)  Labs Reviewed  CBC WITH DIFFERENTIAL - Abnormal; Notable for the following:    Hemoglobin 10.3 (*)     HCT 33.2 (*)     MCH 24.8 (*)     Platelets 490 (*)     All other components within normal limits  COMPREHENSIVE METABOLIC PANEL - Abnormal; Notable for the following:    Albumin 3.1 (*)     All other components within normal limits  URINALYSIS, ROUTINE W REFLEX MICROSCOPIC - Abnormal; Notable for the following:    Hgb urine dipstick MODERATE (*)     Ketones, ur 40 (*)     Leukocytes, UA MODERATE (*)     All other components within normal limits    WET PREP, GENITAL - Abnormal; Notable for the following:    WBC, Wet Prep HPF POC FEW (*)     All other components within normal limits  URINE MICROSCOPIC-ADD ON  GC/CHLAMYDIA PROBE AMP   Ct Abdomen Pelvis W Contrast  04/12/2012  *RADIOLOGY REPORT*  Clinical Data: History of colon cancer with colostomy.  Abdominal pain.  CT ABDOMEN AND PELVIS WITH CONTRAST  Technique:  Multidetector CT imaging of the abdomen and pelvis was performed following the standard protocol during bolus administration of intravenous contrast.  Contrast: 80mL OMNIPAQUE IOHEXOL 300 MG/ML  SOLN  Comparison: 09/24/2011  Findings: 1.8 cm lesion in the dome of the left liver is unchanged. Small adjacent lesions in the lateral dome of the right liver are stable on the current study.  12 mm lesion in the lateral segment  left liver inferiorly, just anterior to the stomach, is stable. No new liver lesions.  The spleen, stomach, duodenum, and adrenal glands are normal.  Mild prominence the main pancreatic duct is unchanged.  Gallbladder is normal.  Left kidney is unremarkable.  Double-J right internal ureteral stent remains in place with proximal loop formed in the right renal pelvis and distal loop formed in the bladder lumen.  No abdominal aortic aneurysm.  Small retroperitoneal lymph nodes are stable.  Imaging through the pelvis again shows the right lower quadrant presumed ileostomy.  There are some patulous small bowel loops in the central pelvis, as seen on previous studies.  The large collection of fluid in an air in the posterior pelvis appears to communicate with the vaginal cuff.  Bone windows reveal no worrisome lytic or sclerotic osseous lesions.  IMPRESSION: No substantial interval change and exam. No new or progressive disease is evident.  The previously characterized hepatic lesions are unchanged.  Persistent collection of air and fluid in the posterior anatomic pelvis has not changed substantially in the interval.  This appears  to be extraperitoneal and can be seen to clearly communicate with the vaginal cuff on the current exam.  No evidence for air in the bladder.  No evidence for bowel obstruction.   Original Report Authenticated By: Kennith Center, [name]      1. Abdominal  pain, other specified site   2. Vaginal bleeding       MDM  Keane Police with complicated abdominal Hx presents with abdominal pain.  CBC without leukocytosis, mild anemia which is chronic for the patient, CMP unremarkable.     CT with No substantial interval change and exam. No new or progressive disease is evident.  The previously characterized hepatic lesions are unchanged.  Persistent collection of air and fluid in the posterior anatomic pelvis has not changed substantially in the interval. This appears to be extraperitoneal and can be seen to clearly communicate with the vaginal cuff on the current exam. No evidence for air in the bladder. No evidence for bowel obstruction.  Pt pain managed in the department.  Pelvic exam was generally painful but without adnexal tenderness.  Labs are concerning for infection.   Urine sent for culture.   Wet prep without evidence of vaginal infection.   CBC without leukocytosis.   Dr. Anitra Lauth was consulted agrees with the plan.     1. Medications: Percocet, Zofran, usual home medications 2. Treatment: rest, drink plenty of fluids, take medications as prescribed 3. Follow Up: Please followup with your primary doctor for discussion of your diagnoses and further evaluation after today's visit; if you do not have a primary care doctor use the resource guide provided to find one; followup with Dr. [name] at [facility] for further evaluation of your rectovaginal fistula and vaginal bleeding.            Dahlia Client Mahika Vanvoorhis, PA-C 04/12/12 2025

## 2012-04-12 NOTE — ED Notes (Signed)
Kathlene November, CT Tech accessed port.

## 2012-04-12 NOTE — ED Notes (Signed)
Patient denies pain but is requesting medication for nausea while drinking contrast for CT.  PA ordered Zofran which was given.

## 2012-04-12 NOTE — ED Notes (Signed)
Patient drinking contrast.  Reports pain as a 2.

## 2012-04-12 NOTE — ED Notes (Signed)
Patient transported to CT 

## 2012-04-13 NOTE — ED Provider Notes (Signed)
Medical screening examination/treatment/procedure(s) were performed by non-physician practitioner and as supervising physician I was immediately available for consultation/collaboration.    Nelia Shi, MD 04/13/12 [phone]

## 2012-05-20 ENCOUNTER — Other Ambulatory Visit: Payer: Self-pay

## 2012-05-20 DIAGNOSIS — C19 Malignant neoplasm of rectosigmoid junction: Secondary | ICD-10-CM

## 2012-05-24 ENCOUNTER — Other Ambulatory Visit: Payer: Medicare Other

## 2012-05-27 ENCOUNTER — Telehealth: Payer: Self-pay | Admitting: Oncology

## 2012-05-27 NOTE — Telephone Encounter (Signed)
returned pt call and advised on new lab d/t.Marland KitchenMarland KitchenMarland KitchenDone

## 2012-05-31 ENCOUNTER — Other Ambulatory Visit: Payer: Medicare Other

## 2012-06-03 ENCOUNTER — Telehealth: Payer: Self-pay | Admitting: Oncology

## 2012-06-03 NOTE — Telephone Encounter (Signed)
returned pt called and advised on Sept appts

## 2012-09-19 ENCOUNTER — Other Ambulatory Visit: Payer: Self-pay | Admitting: *Deleted

## 2012-09-19 ENCOUNTER — Telehealth: Payer: Self-pay | Admitting: *Deleted

## 2012-09-19 MED ORDER — MEGESTROL ACETATE 625 MG/5ML PO SUSP: 625.0000 mg | Freq: Every day | ORAL | Status: DC

## 2012-09-19 NOTE — Telephone Encounter (Signed)
Patient calling to say she would like a script for megace called  In, d/t having her stint changed to her right kidney and she always loses her appetite. Ok to call in per dr Clelia Croft. E-scribed to [facility], [address], [place]

## 2012-12-15 ENCOUNTER — Telehealth: Payer: Self-pay | Admitting: Oncology

## 2012-12-15 NOTE — Telephone Encounter (Signed)
Talked to pt appt for lab and MD ,September 2014

## 2012-12-19 ENCOUNTER — Other Ambulatory Visit (HOSPITAL_BASED_OUTPATIENT_CLINIC_OR_DEPARTMENT_OTHER): Payer: Medicare Other

## 2012-12-19 DIAGNOSIS — C20 Malignant neoplasm of rectum: Secondary | ICD-10-CM

## 2012-12-19 DIAGNOSIS — C19 Malignant neoplasm of rectosigmoid junction: Secondary | ICD-10-CM

## 2012-12-19 LAB — COMPREHENSIVE METABOLIC PANEL (CC13)
ALT: 10 U/L (ref 0–55)
CO2: 26 mEq/L (ref 22–29)
Calcium: 9.7 mg/dL (ref 8.4–10.4)
Chloride: 105 mEq/L (ref 98–109)
Sodium: 140 mEq/L (ref 136–145)
Total Bilirubin: 0.47 mg/dL (ref 0.20–1.20)
Total Protein: 8.1 g/dL (ref 6.4–8.3)

## 2012-12-19 LAB — CBC WITH DIFFERENTIAL/PLATELET
Eosinophils Absolute: 0.2 10*3/uL (ref 0.0–0.5)
MONO#: 1.2 10*3/uL — ABNORMAL HIGH (ref 0.1–0.9)
NEUT#: 5.3 10*3/uL (ref 1.5–6.5)
RBC: 4.31 10*6/uL (ref 3.70–5.45)
RDW: 18.1 % — ABNORMAL HIGH (ref 11.2–14.5)
WBC: 8.5 10*3/uL (ref 3.9–10.3)
lymph#: 1.8 10*3/uL (ref 0.9–3.3)

## 2012-12-20 LAB — CEA: CEA: 10.7 ng/mL — ABNORMAL HIGH (ref 0.0–5.0)

## 2012-12-22 ENCOUNTER — Telehealth: Payer: Self-pay | Admitting: Oncology

## 2012-12-22 ENCOUNTER — Ambulatory Visit: Payer: Medicare Other | Admitting: Hematology and Oncology

## 2012-12-22 ENCOUNTER — Ambulatory Visit (HOSPITAL_BASED_OUTPATIENT_CLINIC_OR_DEPARTMENT_OTHER): Payer: Medicare Other | Admitting: Oncology

## 2012-12-22 VITALS — BP 120/68 | HR 83 | Temp 98.2°F | Resp 18 | Ht 62.25 in | Wt 119.1 lb

## 2012-12-22 DIAGNOSIS — C19 Malignant neoplasm of rectosigmoid junction: Secondary | ICD-10-CM

## 2012-12-22 MED ORDER — ZOLPIDEM TARTRATE 10 MG PO TABS: 10.0000 mg | ORAL_TABLET | Freq: Every evening | ORAL | Status: DC | PRN

## 2012-12-22 NOTE — Telephone Encounter (Signed)
Gave pt appt for lab before MD visit Decmber 2014

## 2012-12-22 NOTE — Progress Notes (Signed)
Hematology and Oncology Follow Up Visit  Heather Mayer [id number] [birth year] 42 y.o. 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 I was diagnosed in 2011   Prior Therapy: She is status post pelvic exoneration that includes rectal resection, total abdominal hysterectomy, bilateral salpingo-oophorectomy and omentectomy. She also received intraperitoneal hypothermic chemotherapy done on 08/08/2009 at [facility]. He had 0/17 lymph nodes involved. She subsequently received radiation therapy with 5-FU continuous infusion that completed on 11/10/2009. I was followed by adjuvant FOLFOX and a Avastin chemotherapy that concluded in in February of 2012  Current therapy: Observation and surveillance.   Interim History:  This is a pleasant 42 year old woman he is to be followed by Dr [name] with the above diagnosis. She is also followed by Dr. [name] at [facility] and had a CT scan done last Friday which presumably did not show any evidence of disease. Clinically she reports that she is feeling relatively well. She had reported some abdominal pain and weight loss related to complication from a ureteral stent but does have resolved at this time. She is not reporting any abdominal pain or distention. Reporting any diarrhea or hematochezia. Is not reporting any major changes in her performance status. She is reporting some insomnia sleeping difficulty and reports her pain is under reasonable control with fentanyl patch and when necessary pain medications.  Medications: I have reviewed the patient's current medications.  Current Outpatient Prescriptions  Medication Sig Dispense Refill  . fentaNYL (DURAGESIC - DOSED MCG/HR) 25 MCG/HR Place 1 patch onto the skin every 3 (three) days.      . megestrol (MEGACE ES) 625 MG/5ML suspension  Take 5 mLs (625 mg total) by mouth daily.  240 mL  0  . ondansetron (ZOFRAN) 4 MG tablet Take 4 mg by mouth as needed. Nausea and vomiting      . ondansetron (ZOFRAN) 4 MG tablet Take 1 tablet (4 mg total) by mouth every 6 (six) hours.  20 tablet  0  . oxyCODONE-acetaminophen (PERCOCET) 10-325 MG per tablet Take 1 tablet by mouth every 6 (six) hours as needed for pain.      . traMADol (ULTRAM) 50 MG tablet Take 50 mg by mouth 2 (two) times daily as needed. pain      . zolpidem (AMBIEN) 10 MG tablet Take 1 tablet (10 mg total) by mouth at bedtime as needed for sleep.  30 tablet  0   No current facility-administered medications for this visit.     Allergies:  Allergies  Allergen Reactions  . Compazine Anaphylaxis    Past Medical History, Surgical history, Social history, and Family History were reviewed and updated.  Review of Systems: Constitutional:  Negative for fever, chills, night sweats, anorexia, weight loss, pain. Cardiovascular: no chest pain or dyspnea on exertion Respiratory: negative Neurological: no TIA or stroke symptoms Dermatological: negative ENT: negative Skin: Negative. Gastrointestinal: no abdominal pain, change in bowel habits, or black or bloody stools Genito-Urinary: negative Hematological and Lymphatic: negative Breast: negative Musculoskeletal: negative Remaining ROS negative. Physical Exam: Blood pressure 120/68, pulse 83, temperature 98.2 F (36.8 C), resp. rate 18, height 5' 2.25" (1.581 m), weight 119 lb 1.6 oz (54.023 kg), SpO2 100.00%. ECOG: 0 General appearance: alert, cooperative and appears stated age Head: Normocephalic, without obvious abnormality, atraumatic Neck: no adenopathy, no carotid bruit, no JVD, supple, symmetrical, trachea midline and thyroid  not enlarged, symmetric, no tenderness/mass/nodules Lymph nodes: Cervical, supraclavicular, and axillary nodes normal. Heart:regular rate and rhythm, S1, S2 normal, no murmur, click, rub or  gallop Lung:chest clear, no wheezing, rales, normal symmetric air entry Abdomin: soft, non-tender, without masses or organomegaly EXT:no erythema, induration, or nodules Neurological exam: No evidence of any neurological deficits both gait and her mood is stable.  Lab Results: Lab Results  Component Value Date   WBC 8.5 12/19/2012   HGB 10.4* 12/19/2012   HCT 33.1* 12/19/2012   MCV 76.7* 12/19/2012   PLT 461* 12/19/2012     Chemistry      Component Value Date/Time   NA 140 12/19/2012 1338   NA 136 04/12/2012 1500   NA 144 04/16/2011 0843   K 3.5 12/19/2012 1338   K 3.6 04/12/2012 1500   K 3.4 04/16/2011 0843   CL 96 04/12/2012 1500   CL 104 03/18/2012 1116   CL 102 04/16/2011 0843   CO2 26 12/19/2012 1338   CO2 27 04/12/2012 1500   CO2 30 04/16/2011 0843   BUN 7.6 12/19/2012 1338   BUN 7 04/12/2012 1500   BUN 13 04/16/2011 0843   CREATININE 0.9 12/19/2012 1338   CREATININE 0.53 04/12/2012 1500   CREATININE 0.5* 04/16/2011 0843      Component Value Date/Time   CALCIUM 9.7 12/19/2012 1338   CALCIUM 10.0 04/12/2012 1500   CALCIUM 9.8 04/16/2011 0843   ALKPHOS 78 12/19/2012 1338   ALKPHOS 109 04/12/2012 1500   ALKPHOS 104* 04/16/2011 0843   AST 13 12/19/2012 1338   AST 16 04/12/2012 1500   AST 16 04/16/2011 0843   ALT 10 12/19/2012 1338   ALT 19 04/12/2012 1500   ALT 15 04/16/2011 0843   BILITOT 0.47 12/19/2012 1338   BILITOT 0.3 04/12/2012 1500   BILITOT 0.40 04/16/2011 0843     Results for Heather Mayer, Heather Mayer (MRN [id number]) as of 12/22/2012 09:29  Ref. Range 03/18/2012 11:16 12/19/2012 13:38  CEA Latest Range: 0.0-5.0 ng/mL 3.8 10.7 (H)    Impression and Plan: 42 year old woman with the following issues:  1. History of colorectal cancer presented with rather extensive perforated disease and status post extensive surgery done in April 2011. She is no evidence to suggest relapsed disease at this point. She does have an elevated CEA which could be false positive especially in the setting of a normal CT scan that was done  at [facility]. For completeness sake. I will repeat the CEA in about 3 months I will resume active surveillance every 9-12 months after that. CT scan of the abdomen and pelvis done on 12/16/2012 at [facility] was reviewed which showed no abnormalities in the lower chest and no clear-cut findings to suggest cancer relapse.  2. Insomnia: Have given her prescription for Ambien with instructions how to use it as well as complications with this medication.  [facility], MD 9/11/201410:59 AM

## 2012-12-28 ENCOUNTER — Telehealth: Payer: Self-pay | Admitting: *Deleted

## 2012-12-28 NOTE — Telephone Encounter (Signed)
Per dr Clelia Croft, if doctors in [place] want [place] to do a pet scan, we need O.V. Notes and order for PET scan. Patient stated she would just go back to [place] to have PET done, i asked her to have them forward any O.V. Notes and results of PET scan to dr Clelia Croft.

## 2012-12-28 NOTE — Telephone Encounter (Signed)
Patient calling to ask if we can do a PET scan here, instead of her going back to [place] for PET. It is ok with her physicians there.

## 2013-03-01 ENCOUNTER — Other Ambulatory Visit: Payer: Self-pay | Admitting: *Deleted

## 2013-03-01 MED ORDER — ONDANSETRON HCL 8 MG PO TABS: 8.0000 mg | ORAL_TABLET | Freq: Three times a day (TID) | ORAL | Status: DC | PRN

## 2013-03-01 NOTE — Telephone Encounter (Signed)
Patient calling to say she has been getting nauseated with strong odors like perfume for the past week. States she will be working in a [employer] for the holidays and would like zofran 8 mg tabs if dr Clelia Croft would prescribe them. Ok per dr Clelia Croft. E-scribed to [facility] [place] [address] [place]. Patient notified.

## 2013-03-23 ENCOUNTER — Other Ambulatory Visit: Payer: Medicare Other

## 2013-03-29 ENCOUNTER — Ambulatory Visit: Payer: Medicare Other | Admitting: Oncology

## 2013-05-18 ENCOUNTER — Telehealth: Payer: Self-pay | Admitting: Oncology

## 2013-05-18 NOTE — Telephone Encounter (Signed)
PT CALLED TO R/S MISSED APPTS FROM DEC AND NEXT AVAILABLE PORT FLUSH. PT GIVEN FLUSH APPT FOR 2/13 - LB APPT FOR 3/3 AND F/U APPT FOR 3/10

## 2013-05-30 ENCOUNTER — Encounter (HOSPITAL_COMMUNITY): Payer: Self-pay | Admitting: Emergency Medicine

## 2013-05-30 ENCOUNTER — Emergency Department (HOSPITAL_COMMUNITY)
Admission: EM | Admit: 2013-05-30 | Discharge: 2013-05-31 | Disposition: A | Discharge status: Other Acute Inpatient Hospital | Payer: Medicare Other | Attending: Emergency Medicine | Admitting: Emergency Medicine

## 2013-05-30 DIAGNOSIS — Z90710 Acquired absence of both cervix and uterus: Secondary | ICD-10-CM | POA: Insufficient documentation

## 2013-05-30 DIAGNOSIS — N824 Other female intestinal-genital tract fistulae: Secondary | ICD-10-CM | POA: Insufficient documentation

## 2013-05-30 DIAGNOSIS — Z9889 Other specified postprocedural states: Secondary | ICD-10-CM | POA: Insufficient documentation

## 2013-05-30 DIAGNOSIS — Z933 Colostomy status: Secondary | ICD-10-CM | POA: Insufficient documentation

## 2013-05-30 DIAGNOSIS — N823 Fistula of vagina to large intestine: Secondary | ICD-10-CM

## 2013-05-30 DIAGNOSIS — Z79899 Other long term (current) drug therapy: Secondary | ICD-10-CM | POA: Insufficient documentation

## 2013-05-30 DIAGNOSIS — Z85038 Personal history of other malignant neoplasm of large intestine: Secondary | ICD-10-CM | POA: Insufficient documentation

## 2013-05-30 DIAGNOSIS — K625 Hemorrhage of anus and rectum: Secondary | ICD-10-CM | POA: Insufficient documentation

## 2013-05-30 NOTE — ED Notes (Signed)
Pt states she was having rectal bleeding for one week then she started having the vaginal bleeding for the past week  Pt states she has an ostomy and has a portacath on the right side

## 2013-05-30 NOTE — ED Notes (Signed)
Pt states she is having vaginal bleeding  Pt states she has been bleeding for the past two weeks  Pt states she had a hysterectomy in 2011  Pt states she is having pain in her rectal area on the right side

## 2013-05-31 ENCOUNTER — Encounter (HOSPITAL_COMMUNITY): Payer: Self-pay

## 2013-05-31 ENCOUNTER — Emergency Department (HOSPITAL_COMMUNITY): Payer: Medicare Other

## 2013-05-31 LAB — CBC WITH DIFFERENTIAL/PLATELET
BASOS ABS: 0 10*3/uL (ref 0.0–0.1)
Basophils Relative: 0 % (ref 0–1)
EOS PCT: 1 % (ref 0–5)
Eosinophils Absolute: 0.1 10*3/uL (ref 0.0–0.7)
HCT: 28.8 % — ABNORMAL LOW (ref 36.0–46.0)
Hemoglobin: 9.2 g/dL — ABNORMAL LOW (ref 12.0–15.0)
Lymphocytes Relative: 23 % (ref 12–46)
Lymphs Abs: 2.1 10*3/uL (ref 0.7–4.0)
MCH: 25.5 pg — ABNORMAL LOW (ref 26.0–34.0)
MCHC: 31.9 g/dL (ref 30.0–36.0)
MCV: 79.8 fL (ref 78.0–100.0)
Monocytes Absolute: 1.1 10*3/uL — ABNORMAL HIGH (ref 0.1–1.0)
Monocytes Relative: 12 % (ref 3–12)
Neutro Abs: 6 10*3/uL (ref 1.7–7.7)
Neutrophils Relative %: 64 % (ref 43–77)
Platelets: 515 10*3/uL — ABNORMAL HIGH (ref 150–400)
RBC: 3.61 MIL/uL — ABNORMAL LOW (ref 3.87–5.11)
RDW: 15.3 % (ref 11.5–15.5)
WBC: 9.4 10*3/uL (ref 4.0–10.5)

## 2013-05-31 LAB — URINE MICROSCOPIC-ADD ON

## 2013-05-31 LAB — URINALYSIS, ROUTINE W REFLEX MICROSCOPIC
Bilirubin Urine: NEGATIVE
Glucose, UA: NEGATIVE mg/dL
Ketones, ur: NEGATIVE mg/dL
Nitrite: NEGATIVE
PROTEIN: NEGATIVE mg/dL
Specific Gravity, Urine: 1.009 (ref 1.005–1.030)
Urobilinogen, UA: 1 mg/dL (ref 0.0–1.0)
pH: 7 (ref 5.0–8.0)

## 2013-05-31 LAB — COMPREHENSIVE METABOLIC PANEL
ALT: 11 U/L (ref 0–35)
AST: 9 U/L (ref 0–37)
Albumin: 2.6 g/dL — ABNORMAL LOW (ref 3.5–5.2)
Alkaline Phosphatase: 95 U/L (ref 39–117)
BUN: 9 mg/dL (ref 6–23)
CALCIUM: 9.2 mg/dL (ref 8.4–10.5)
CO2: 23 meq/L (ref 19–32)
Chloride: 99 mEq/L (ref 96–112)
Creatinine, Ser: 0.88 mg/dL (ref 0.50–1.10)
GFR calc Af Amer: >90 mL/min (ref >90)
GFR, EST NON AFRICAN AMERICAN: 81 mL/min — AB (ref >90)
Glucose, Bld: 100 mg/dL — ABNORMAL HIGH (ref 70–99)
Potassium: 3.3 mEq/L — ABNORMAL LOW (ref 3.7–5.3)
Sodium: 137 mEq/L (ref 137–147)
TOTAL PROTEIN: 7.3 g/dL (ref 6.0–8.3)
Total Bilirubin: <0.2 mg/dL — ABNORMAL LOW (ref 0.3–1.2)

## 2013-05-31 LAB — WET PREP, GENITAL
CLUE CELLS WET PREP: NONE SEEN
Trich, Wet Prep: NONE SEEN
Yeast Wet Prep HPF POC: NONE SEEN

## 2013-05-31 LAB — TYPE AND SCREEN
ABO/RH(D): B POS
Antibody Screen: NEGATIVE

## 2013-05-31 LAB — GC/CHLAMYDIA PROBE AMP
CT Probe RNA: NEGATIVE
GC PROBE AMP APTIMA: NEGATIVE

## 2013-05-31 MED ORDER — DEXTROSE 5 % IV SOLN
1.0000 g | Freq: Once | INTRAVENOUS | Status: AC
  Administered 2013-05-31: 1 g via INTRAVENOUS
  Filled 2013-05-31: qty 10

## 2013-05-31 MED ORDER — MORPHINE SULFATE 4 MG/ML IJ SOLN
4.0000 mg | Freq: Once | INTRAMUSCULAR | Status: AC
  Administered 2013-05-31: 4 mg via INTRAVENOUS
  Filled 2013-05-31: qty 1

## 2013-05-31 MED ORDER — IOHEXOL 300 MG/ML  SOLN
50.0000 mL | Freq: Once | INTRAMUSCULAR | Status: AC | PRN
  Administered 2013-05-31: 50 mL via ORAL

## 2013-05-31 MED ORDER — IOHEXOL 300 MG/ML  SOLN
100.0000 mL | Freq: Once | INTRAMUSCULAR | Status: AC | PRN
  Administered 2013-05-31: 100 mL via INTRAVENOUS

## 2013-05-31 MED ORDER — SODIUM CHLORIDE 0.9 % IV BOLUS (SEPSIS)
1000.0000 mL | Freq: Once | INTRAVENOUS | Status: AC
  Administered 2013-05-31: 1000 mL via INTRAVENOUS

## 2013-05-31 NOTE — ED Provider Notes (Signed)
Patient has a history of colorectal cancer. She has had radiation therapy and has a colostomy. 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 She reports she started having rectal bleeding about 2 weeks ago. She states initially it seemed to be coming from her rectal area but the past week it seems to be coming more from her vaginal area. She reports at times she has clots specifically 2 nights ago. Other times she's had brown/red bleeding and at times when she urinates she sees bright red blood. She has not had any bleeding from her ostomy. Patient has had her surgery done at [facility] by Dr. Clovis Riley.  Patient is alert and cooperative. Pelvic exam was done by my PA.   Medical screening examination/treatment/procedure(s) were conducted as a shared visit with non-physician practitioner(s) and myself.  I personally evaluated the patient during the encounter.  EKG Interpretation   None         Rolland Porter, MD, Abram Sander   Janice Norrie, MD 05/31/13 [phone]

## 2013-05-31 NOTE — ED Provider Notes (Signed)
CSN: [id number]     Arrival date & time 05/30/13  2336 History   First MD Initiated Contact with Patient 05/30/13 2351     Chief Complaint  Patient presents with  . Vaginal Bleeding     (Consider location/radiation/quality/duration/timing/severity/associated sxs/prior Treatment) HPI History provided by pt.   Pt has h/o colorectal cancer, in remission since 2011, has a colostomy and is s/p rectoperitoneal fistula repair, renal artery stents and total hysterectomy.  Presents w/ c/o heavy vaginal bleeding x 1 week that is aggravated by standing/walking.  Onset preceded by week of rectal bleeding.  Associated w/ constant pressure of pelvis, rectum and buttocks.  Denies fever, vomiting, other GU sx, epistaxis, easy bruising.  She is not sexually active.  She is not anti-coagulated.  Has CT abd/pelvis q6 months, most recently in 12/2012, and had a clean PET scan at that time as well.  Past Medical History  Diagnosis Date  . Colorectal cancer     colo-rectal ca   . Primary appendiceal adenocarcinoma    Past Surgical History  Procedure Laterality Date  . Abdominal surgery    . Ostomy take down    . Rectoperitoneal fistula closure    . Renal artery stent     History reviewed. No pertinent family history. History  Substance Use Topics  . Smoking status: Never Smoker   . Smokeless tobacco: Not on file  . Alcohol Use: No   OB History   Grav Para Term Preterm Abortions TAB SAB Ect Mult Living                 Review of Systems  All other systems reviewed and are negative.      Allergies  Compazine  Home Medications   Current Outpatient Rx  Name  Route  Sig  Dispense  Refill  . fentaNYL (DURAGESIC - DOSED MCG/HR) 25 MCG/HR   Transdermal   Place 25 mcg onto the skin every 3 (three) days.          . megestrol (MEGACE ES) 625 MG/5ML suspension   Oral   Take 625 mg by mouth daily.         [name] oxyCODONE-acetaminophen (PERCOCET) 10-325 MG per tablet   Oral   Take 1 tablet by  mouth every 6 (six) hours as needed for pain.         . tamsulosin (FLOMAX) 0.4 MG CAPS capsule   Oral   Take 0.4 mg by mouth daily as needed (soreness).         . zolpidem (AMBIEN) 10 MG tablet   Oral   Take 10 mg by mouth at bedtime as needed for sleep.         . traMADol (ULTRAM) 50 MG tablet   Oral   Take 50 mg by mouth 2 (two) times daily as needed. pain         . EXPIRED: zolpidem (AMBIEN) 10 MG tablet   Oral   Take 1 tablet (10 mg total) by mouth at bedtime as needed for sleep.   30 tablet   0    BP 126/55  Pulse 126  Temp(Src) 98.5 F (36.9 C) (Oral)  Resp 22  SpO2 94% Physical Exam  Nursing note and vitals reviewed. Constitutional: She is oriented to person, place, and time. She appears well-developed and well-nourished. No distress.  HENT:  Head: Normocephalic and atraumatic.  Eyes:  Normal appearance  Neck: Normal range of motion.  Cardiovascular: Normal rate and regular rhythm.  Pulmonary/Chest: Effort normal and breath sounds normal. No respiratory distress.  Abdominal: Soft. Bowel sounds are normal. She exhibits no distension and no mass. There is no rebound and no guarding.  Diffuse ttp, worst across lower abdomen and pelvis.  Colostomy bag RLQ.   Genitourinary:  No CVA tenderness.  Nml external genitalia.  Cervix surgically absent.  There is what appears to be a fistula on posterior wall of vagina that drains blood and yellow-brown tinged fluid when pressure applied to area.  Severe tenderness on speculum exam.  Bimanual exam deferred.   Musculoskeletal: Normal range of motion.  Neurological: She is alert and oriented to person, place, and time.  Skin: Skin is warm and dry. No rash noted.  Psychiatric: She has a normal mood and affect. Her behavior is normal.    ED Course  Procedures (including critical care time) Labs Review Labs Reviewed  WET PREP, GENITAL - Abnormal; Notable for the following:    WBC, Wet Prep HPF POC MANY (*)    All  other components within normal limits  CBC WITH DIFFERENTIAL - Abnormal; Notable for the following:    RBC 3.61 (*)    Hemoglobin 9.2 (*)    HCT 28.8 (*)    MCH 25.5 (*)    Platelets 515 (*)    Monocytes Absolute 1.1 (*)    All other components within normal limits  COMPREHENSIVE METABOLIC PANEL - Abnormal; Notable for the following:    Potassium 3.3 (*)    Glucose, Bld 100 (*)    Albumin 2.6 (*)    Total Bilirubin <0.2 (*)    GFR calc non Af Amer 81 (*)    All other components within normal limits  URINALYSIS, ROUTINE W REFLEX MICROSCOPIC - Abnormal; Notable for the following:    Hgb urine dipstick SMALL (*)    Leukocytes, UA MODERATE (*)    All other components within normal limits  GC/CHLAMYDIA PROBE AMP  URINE MICROSCOPIC-ADD ON  TYPE AND SCREEN   Imaging Review Ct Abdomen Pelvis W Contrast  05/31/2013   CLINICAL DATA:  Pelvic pain, vaginal bleeding, rectal bleeding. History of chole rectal cancer. 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  EXAM: CT ABDOMEN AND PELVIS WITH CONTRAST  TECHNIQUE: Multidetector CT imaging of the abdomen and pelvis was performed using the standard protocol following bolus administration of intravenous contrast.  CONTRAST:  188mL OMNIPAQUE IOHEXOL 300 MG/ML  SOLN  COMPARISON:  CT ABD/PELVIS W CM dated 04/12/2012  FINDINGS: Lung bases are clear. No effusions. Heart is normal size.  Small hypodensities peripherally in the dome of the liver are stable. 2.0 cm low-density lesion on the left was previously thought to be within the left hepatic dome, but on coronal imaging appears to be within the spleen on today's study. 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 This is not significantly changed.  Pancreas, adrenals and gallbladder are unremarkable.  Right ureteral stent is in place, unchanged. New mild right hydronephrosis. Mild fullness of the left renal collecting system and ureter as well.  Right lower quadrant ostomy again noted. Large pelvic  gas and fluid collection in the presacral space again noted. This measures 7.5 x 6.3 cm, unchanged in size. The collection now contains more fluid than it did previously. This continues to the vaginal cuff an was shown on prior study to communicate with the vaginal cuff.  Urinary bladder is thick walled. Scattered small retroperitoneal lymph nodes, not significantly changed since prior study.  No acute bony abnormality.  IMPRESSION: Postsurgical changes from prior colon surgery. Right lower quadrant ostomy noted.  Fluid and gas collection in the posterior pelvis/ presacral space. Overall, size is similar to prior study. Increasing fluid component.  Right ureteral stent remains in place, but there is increasing right hydronephrosis. Mild new fullness of the left renal collecting system and ureter, possibly related to the gas and fluid collection in the pelvis.  Diffuse bladder wall thickening.  Cannot exclude cystitis.   Electronically Signed   By: Rolm Baptise [name]   On: 05/31/2013 03:25    EKG Interpretation   None       MDM   Final diagnoses:  Rectal bleeding  Rectovaginal fistula    43yo F w/ h/o colorectal cancer, in remission since 2011 and most recently imaged in 12/2012, presents w/ heavy vaginal bleeding x 1 week, preceded by one week of rectal bleeding, and associated w/ severe pelvic and rectal pressure.  Pt s/p total hysterectomy.  Exam concerning for rectovaginal fistula.  CT abd/pelvis and labs are pending.  Hemodynamically stable currently.  Pt to receive 1L NS bolus as well as 4mg  morphine for pain.  1:03 AM   Pain improved and pt otherwise stable.  1:57 AM   CT shows increased fluid but stable gas collection in posterior pelvis/presacral space w/ secondary, bilateral hydronephrosis, and bladder wall thickening (likely because patient has a UTI).  Other than U/A, labs unremarkable.  All results have been discussed w/ patient.  Pain currently controlled.  She has received 1g IV  rocephin.  Dr[facility] Johney Maine at [facility] has accepted patient's transfer to ED for admission.  She will see Dr. Clovis Riley, her surgical oncologist, tomorrow.  4:33 AM   Remer Macho, PA-C 05/31/13 [phone]

## 2013-05-31 NOTE — ED Notes (Signed)
Bed: WA25 Expected date:  Expected time:  Means of arrival:  Comments: Room 5 

## 2013-06-13 ENCOUNTER — Other Ambulatory Visit: Payer: Medicare Other

## 2013-06-13 ENCOUNTER — Telehealth: Payer: Self-pay | Admitting: Oncology

## 2013-06-13 NOTE — Telephone Encounter (Signed)
pt called to r/s lab to friday...done....pt aware of new d.t °

## 2013-06-16 ENCOUNTER — Other Ambulatory Visit: Payer: Medicare Other

## 2013-06-19 ENCOUNTER — Telehealth: Payer: Self-pay | Admitting: Oncology

## 2013-06-19 NOTE — Telephone Encounter (Signed)
pt called to r/s all appts....done pt aware of all appts

## 2013-06-20 ENCOUNTER — Ambulatory Visit: Payer: Medicare Other | Admitting: Oncology

## 2013-06-23 ENCOUNTER — Other Ambulatory Visit: Payer: Medicare Other

## 2013-06-30 ENCOUNTER — Telehealth: Payer: Self-pay | Admitting: Oncology

## 2013-06-30 NOTE — Telephone Encounter (Signed)
pt called to r/s missed appt...done....pt aware of new d.t °

## 2013-07-04 ENCOUNTER — Other Ambulatory Visit (HOSPITAL_BASED_OUTPATIENT_CLINIC_OR_DEPARTMENT_OTHER): Payer: Medicare Other

## 2013-07-04 DIAGNOSIS — C19 Malignant neoplasm of rectosigmoid junction: Secondary | ICD-10-CM

## 2013-07-04 LAB — COMPREHENSIVE METABOLIC PANEL (CC13)
ALBUMIN: 3.7 g/dL (ref 3.5–5.0)
ALT: 11 U/L (ref 0–55)
ANION GAP: 12 meq/L — AB (ref 3–11)
AST: 16 U/L (ref 5–34)
Alkaline Phosphatase: 129 U/L (ref 40–150)
BUN: 10.2 mg/dL (ref 7.0–26.0)
CO2: 26 mEq/L (ref 22–29)
Calcium: 10 mg/dL (ref 8.4–10.4)
Chloride: 106 mEq/L (ref 98–109)
Creatinine: 0.9 mg/dL (ref 0.6–1.1)
GLUCOSE: 83 mg/dL (ref 70–140)
POTASSIUM: 3.9 meq/L (ref 3.5–5.1)
Sodium: 143 mEq/L (ref 136–145)
Total Bilirubin: 0.4 mg/dL (ref 0.20–1.20)
Total Protein: 7.8 g/dL (ref 6.4–8.3)

## 2013-07-04 LAB — CBC WITH DIFFERENTIAL/PLATELET
BASO%: 0.3 % (ref 0.0–2.0)
BASOS ABS: 0 10*3/uL (ref 0.0–0.1)
EOS ABS: 0.1 10*3/uL (ref 0.0–0.5)
EOS%: 1.6 % (ref 0.0–7.0)
HCT: 35.5 % (ref 34.8–46.6)
HEMOGLOBIN: 10.9 g/dL — AB (ref 11.6–15.9)
LYMPH%: 22.7 % (ref 14.0–49.7)
MCH: 25 pg — ABNORMAL LOW (ref 25.1–34.0)
MCHC: 30.8 g/dL — ABNORMAL LOW (ref 31.5–36.0)
MCV: 81.1 fL (ref 79.5–101.0)
MONO#: 0.4 10*3/uL (ref 0.1–0.9)
MONO%: 6.1 % (ref 0.0–14.0)
NEUT%: 69.3 % (ref 38.4–76.8)
NEUTROS ABS: 4.9 10*3/uL (ref 1.5–6.5)
PLATELETS: 372 10*3/uL (ref 145–400)
RBC: 4.38 10*6/uL (ref 3.70–5.45)
RDW: 18.9 % — AB (ref 11.2–14.5)
WBC: 7.1 10*3/uL (ref 3.9–10.3)
lymph#: 1.6 10*3/uL (ref 0.9–3.3)

## 2013-07-05 LAB — CEA

## 2013-07-10 ENCOUNTER — Telehealth: Payer: Self-pay | Admitting: Oncology

## 2013-07-10 NOTE — Telephone Encounter (Signed)
pt called to r/s appt dut to work sched....pt aware of new d.t

## 2013-07-12 ENCOUNTER — Ambulatory Visit: Payer: Medicare Other | Admitting: Oncology

## 2013-07-27 ENCOUNTER — Ambulatory Visit (HOSPITAL_BASED_OUTPATIENT_CLINIC_OR_DEPARTMENT_OTHER): Payer: Medicare Other | Admitting: Oncology

## 2013-07-27 ENCOUNTER — Encounter: Payer: Self-pay | Admitting: Oncology

## 2013-07-27 ENCOUNTER — Telehealth: Payer: Self-pay | Admitting: Oncology

## 2013-07-27 VITALS — BP 114/64 | HR 70 | Temp 97.4°F | Resp 18 | Ht 62.0 in | Wt 124.3 lb

## 2013-07-27 DIAGNOSIS — D509 Iron deficiency anemia, unspecified: Secondary | ICD-10-CM

## 2013-07-27 DIAGNOSIS — G4700 Insomnia, unspecified: Secondary | ICD-10-CM

## 2013-07-27 DIAGNOSIS — C19 Malignant neoplasm of rectosigmoid junction: Secondary | ICD-10-CM

## 2013-07-27 DIAGNOSIS — D500 Iron deficiency anemia secondary to blood loss (chronic): Secondary | ICD-10-CM | POA: Insufficient documentation

## 2013-07-27 DIAGNOSIS — Z85038 Personal history of other malignant neoplasm of large intestine: Secondary | ICD-10-CM

## 2013-07-27 MED ORDER — ZOLPIDEM TARTRATE 10 MG PO TABS: 10.0000 mg | ORAL_TABLET | Freq: Every evening | ORAL | Status: AC | PRN

## 2013-07-27 NOTE — Progress Notes (Signed)
Hematology and Oncology Follow Up Visit  Heather Mayer [id number] 10/21/1970 43 y.o. 07/27/2013 4:37 PM Heather Mayer, MD[place], Heather Mayer, Heather Mayer   Principle Diagnosis: 43 year old woman diagnosed with rectal cancer in 2011 presented with perforated use of this adenocarcinoma. It was diagnosed in 2011   Prior Therapy: She is status post pelvic exoneration that includes rectal resection, total abdominal hysterectomy, bilateral salpingo-oophorectomy and omentectomy. She also received intraperitoneal hypothermic chemotherapy done on 08/08/2009 at [facility]. He had 0/17 lymph nodes involved. She subsequently received radiation therapy with 5-FU continuous infusion that completed on 11/10/2009. I was followed by adjuvant FOLFOX and a Avastin chemotherapy that concluded in in February of 2012  Current therapy: Observation and surveillance.   Interim History:  This is a pleasant 4-year-old woman used to be followed by Dr Jamse Arn with the above diagnosis. She is also followed by Dr. Clovis Mayer at [facility]. Since her last visit she have developed rectal/vaginal bleeding that was attributed to a fistula. She has currently a Vac for an intra-abdominal fluid collection. Clinically she reports feeling weak and lethargic. She has reported occasional dyspnea on exertion as well as increase ice cravings.She is not reporting any abdominal pain or distention. Reporting any diarrhea or hematochezia. Is not reporting any major changes in her performance status. She still reports occasional insomnia issues.  Medications: I have reviewed the patient's current medications.  Current Outpatient Prescriptions  Medication Sig Dispense Refill  . fentaNYL (DURAGESIC - DOSED MCG/HR) 25 MCG/HR Place 25 mcg onto the skin every 3 (three) days.       [name] gabapentin (NEURONTIN) 300 MG capsule Take 300 mg by mouth 3 (three) times daily.      . megestrol (MEGACE ES) 625 MG/5ML suspension Take 625 mg by  mouth daily.      [name] oxyCODONE-acetaminophen (PERCOCET) 10-325 MG per tablet Take 1 tablet by mouth every 6 (six) hours as needed for pain.      . tamsulosin (FLOMAX) 0.4 MG CAPS capsule Take 0.4 mg by mouth daily as needed (soreness).      . zolpidem (AMBIEN) 10 MG tablet Take 1 tablet (10 mg total) by mouth at bedtime as needed for sleep.  30 tablet  1  . zolpidem (AMBIEN) 10 MG tablet Take 1 tablet (10 mg total) by mouth at bedtime as needed for sleep.  30 tablet  0   No current facility-administered medications for this visit.     Allergies:  Allergies  Allergen Reactions  . Compazine Anaphylaxis    Past Medical History, Surgical history, Social history, and Family History were reviewed and updated.  Review of Systems: Constitutional:  Negative for fever, chills, night sweats, anorexia, weight loss, pain. Cardiovascular: no chest pain or dyspnea on exertion Respiratory: negative Neurological: no TIA or stroke symptoms Dermatological: negative ENT: negative Skin: Negative. Gastrointestinal: no abdominal pain, change in bowel habits, or black or bloody stools Genito-Urinary: negative Hematological and Lymphatic: negative Breast: negative Musculoskeletal: negative Remaining ROS negative. Physical Exam: Blood pressure 114/64, pulse 70, temperature 97.4 F (36.3 C), temperature source Oral, resp. rate 18, height 5\' 2"  (1.575 m), weight 124 lb 4.8 oz (56.382 kg), SpO2 100.00%. ECOG: 0 General appearance: alert, cooperative and appears stated age Head: Normocephalic, without obvious abnormality, atraumatic Neck: no adenopathy, no carotid bruit, no JVD, supple, symmetrical, trachea midline and thyroid not enlarged, symmetric, no tenderness/mass/nodules Lymph nodes: Cervical, supraclavicular, and axillary nodes normal. Heart:regular rate and rhythm, S1, S2 normal, no murmur, click, rub  or gallop Lung:chest clear, no wheezing, rales, normal symmetric air entry Abdomin: soft,  non-tender, without masses or organomegaly EXT:no erythema, induration, or nodules Neurological exam: No evidence of any neurological deficits both gait and her mood is stable.  Lab Results: Lab Results  Component Value Date   WBC 7.1 07/04/2013   HGB 10.9* 07/04/2013   HCT 35.5 07/04/2013   MCV 81.1 07/04/2013   PLT 372 07/04/2013     Chemistry      Component Value Date/Time   NA 143 07/04/2013 1022   NA 137 05/31/2013 0049   NA 144 04/16/2011 0843   K 3.9 07/04/2013 1022   K 3.3* 05/31/2013 0049   K 3.4 04/16/2011 0843   CL 99 05/31/2013 0049   CL 104 03/18/2012 1116   CL 102 04/16/2011 0843   CO2 26 07/04/2013 1022   CO2 23 05/31/2013 0049   CO2 30 04/16/2011 0843   BUN 10.2 07/04/2013 1022   BUN 9 05/31/2013 0049   BUN 13 04/16/2011 0843   CREATININE 0.9 07/04/2013 1022   CREATININE 0.88 05/31/2013 0049   CREATININE 0.5* 04/16/2011 0843      Component Value Date/Time   CALCIUM 10.0 07/04/2013 1022   CALCIUM 9.2 05/31/2013 0049   CALCIUM 9.8 04/16/2011 0843   ALKPHOS 129 07/04/2013 1022   ALKPHOS 95 05/31/2013 0049   ALKPHOS 104* 04/16/2011 0843   AST 16 07/04/2013 1022   AST 9 05/31/2013 0049   AST 16 04/16/2011 0843   ALT 11 07/04/2013 1022   ALT 11 05/31/2013 0049   ALT 15 04/16/2011 0843   BILITOT 0.40 07/04/2013 1022   BILITOT <0.2* 05/31/2013 0049   BILITOT 0.40 04/16/2011 0843      Results for Heather Mayer (MRN [id number]) as of 07/27/2013 16:23  Ref. Range 03/18/2012 11:16 12/19/2012 13:38 07/04/2013 10:22  CEA Latest Range: 0.0-5.0 ng/mL 3.8 10.7 (H) <0.5    Impression and Plan: 43 year old woman with the following issues:  1. History of colorectal cancer presented with rather extensive perforated disease and status post extensive surgery done in April 2011. She is no evidence to suggest relapsed disease at this point. She has had extensive imaging studies done at [facility] as well as a repeat CEA in March of 2015 which was all normal.  2. Anemia: She has  microcytosis an elevated RDW likely representing iron deficiency anemia. Risks and benefits of IV area and was discussed today. Complications from Feraheme includes infusion-related toxicity such as arthralgias, myalgias and possible serious infusion related reactions were discussed today and she is agreeable to proceed. Alternative therapy such as oral iron were suggested the patient wanted to proceed with IV.  3. Insomnia: Have given her prescription for Ambien with instructions how to use it as well as complications with this medication.  4. followup: Will be in 3 months to assess her hemoglobin and iron levels. At her hemoglobin have normalize, then the next followup will be in 6 months with CBC see med and a CEA.  Wyatt Portela, Heather Mayer 4/16/20154:37 PM

## 2013-07-27 NOTE — Telephone Encounter (Signed)
gave pt appt for lab, md and IV Iron for tomorrow, per Chevis Pretty, RN, IV iron is 2 pm

## 2013-07-28 ENCOUNTER — Ambulatory Visit (HOSPITAL_BASED_OUTPATIENT_CLINIC_OR_DEPARTMENT_OTHER): Payer: Medicare Other

## 2013-07-28 ENCOUNTER — Other Ambulatory Visit (HOSPITAL_BASED_OUTPATIENT_CLINIC_OR_DEPARTMENT_OTHER): Payer: Medicare Other

## 2013-07-28 VITALS — BP 118/66 | HR 70 | Temp 98.0°F | Resp 18

## 2013-07-28 DIAGNOSIS — D509 Iron deficiency anemia, unspecified: Secondary | ICD-10-CM

## 2013-07-28 DIAGNOSIS — D500 Iron deficiency anemia secondary to blood loss (chronic): Secondary | ICD-10-CM

## 2013-07-28 DIAGNOSIS — C19 Malignant neoplasm of rectosigmoid junction: Secondary | ICD-10-CM

## 2013-07-28 MED ORDER — METHYLPREDNISOLONE SODIUM SUCC 125 MG IJ SOLR
125.0000 mg | Freq: Once | INTRAMUSCULAR | Status: AC
Start: 2013-07-28 — End: 2013-07-28
  Administered 2013-07-28: 125 mg via INTRAVENOUS

## 2013-07-28 MED ORDER — FAMOTIDINE IN NACL 20-0.9 MG/50ML-% IV SOLN
20.0000 mg | Freq: Once | INTRAVENOUS | Status: AC
  Administered 2013-07-28: 20 mg via INTRAVENOUS

## 2013-07-28 MED ORDER — SODIUM CHLORIDE 0.9 % IV SOLN
1020.0000 mg | Freq: Once | INTRAVENOUS | Status: AC
  Administered 2013-07-28 (×2): 1020 mg via INTRAVENOUS
  Filled 2013-07-28: qty 34

## 2013-07-28 MED ORDER — SODIUM CHLORIDE 0.9 % IV SOLN
Freq: Once | INTRAVENOUS | Status: AC
  Administered 2013-07-28: 15:00:00 via INTRAVENOUS

## 2013-07-28 MED ORDER — DIPHENHYDRAMINE HCL 50 MG/ML IJ SOLN
50.0000 mg | Freq: Once | INTRAMUSCULAR | Status: AC
  Administered 2013-07-28: 50 mg via INTRAVENOUS

## 2013-07-28 NOTE — Progress Notes (Signed)
1505 Feraheme started, 5 minutes into infusion patient stated that she could not breathe. Infusion stopped and normal saline started wide open, Solumedrol 125 mg, Benadryl 50 mg and Pepcid 20 mg all given IV.  Vs 1511 150/90 104 O2 sat 100% on 2L.   1514 120/72 91 O2 sat 100% 1519 119/64 87 Od sat 100% Dr. Alen Blew here to see patient, patient also c/o head throbbing.  1524 120/57 83 O2 sat 100% 1L   1530 Patient states that breathing is easier, denies any pain.

## 2013-07-31 LAB — IRON AND TIBC CHCC
%SAT: 9 % — ABNORMAL LOW (ref 21–57)
Iron: 32 ug/dL — ABNORMAL LOW (ref 41–142)
TIBC: 368 ug/dL (ref 236–444)
UIBC: 335 ug/dL (ref 120–384)

## 2013-07-31 LAB — FERRITIN CHCC: Ferritin: 13 ng/ml (ref 9–269)

## 2013-10-26 ENCOUNTER — Other Ambulatory Visit: Payer: Medicare Other

## 2013-10-26 ENCOUNTER — Ambulatory Visit: Payer: Medicare Other | Admitting: Physician Assistant

## 2013-10-26 ENCOUNTER — Telehealth: Payer: Self-pay | Admitting: Oncology

## 2013-10-26 NOTE — Telephone Encounter (Signed)
returned pt call line busy °

## 2013-12-15 ENCOUNTER — Telehealth: Payer: Self-pay | Admitting: Medical Oncology

## 2013-12-15 NOTE — Telephone Encounter (Signed)
Patient LVMOM stating having increased weakness, decreased appetite and inaudible message regarding megace. Attempted return call to patient, no answer, lvmom for her to call office.

## 2014-09-18 ENCOUNTER — Telehealth: Payer: Self-pay | Admitting: *Deleted

## 2014-09-18 NOTE — Telephone Encounter (Signed)
VM message from patient. Call back to identified phone # and spoke with patient.  She states she has been hospitalized at least 3 times @ [facility] (Dr. 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  She was seen here in April of 2015 for feraheme infusion.  She states she is feeling very fatigued and , has malaise and decreased appetite. Advised her to contact Dr. Clovis Riley as he has been actively involved in her care or her PCP.  Patient voiced understanding and will contact Dr. Georgina Snell office today.

## 2014-12-07 ENCOUNTER — Emergency Department (HOSPITAL_BASED_OUTPATIENT_CLINIC_OR_DEPARTMENT_OTHER)
Admission: EM | Admit: 2014-12-07 | Discharge: 2014-12-07 | Disposition: A | Discharge status: Home / Self Care | Payer: Medicare Other | Attending: Emergency Medicine | Admitting: Emergency Medicine

## 2014-12-07 ENCOUNTER — Emergency Department (HOSPITAL_BASED_OUTPATIENT_CLINIC_OR_DEPARTMENT_OTHER): Payer: Medicare Other

## 2014-12-07 ENCOUNTER — Encounter (HOSPITAL_BASED_OUTPATIENT_CLINIC_OR_DEPARTMENT_OTHER): Payer: Self-pay | Admitting: Emergency Medicine

## 2014-12-07 DIAGNOSIS — Z85038 Personal history of other malignant neoplasm of large intestine: Secondary | ICD-10-CM | POA: Insufficient documentation

## 2014-12-07 DIAGNOSIS — R109 Unspecified abdominal pain: Secondary | ICD-10-CM | POA: Diagnosis present

## 2014-12-07 DIAGNOSIS — R1031 Right lower quadrant pain: Secondary | ICD-10-CM

## 2014-12-07 DIAGNOSIS — K9409 Other complications of colostomy: Secondary | ICD-10-CM | POA: Diagnosis not present

## 2014-12-07 DIAGNOSIS — Z85048 Personal history of other malignant neoplasm of rectum, rectosigmoid junction, and anus: Secondary | ICD-10-CM | POA: Diagnosis not present

## 2014-12-07 DIAGNOSIS — Z91040 Latex allergy status: Secondary | ICD-10-CM | POA: Diagnosis not present

## 2014-12-07 DIAGNOSIS — Z87440 Personal history of urinary (tract) infections: Secondary | ICD-10-CM | POA: Insufficient documentation

## 2014-12-07 DIAGNOSIS — Z79899 Other long term (current) drug therapy: Secondary | ICD-10-CM | POA: Insufficient documentation

## 2014-12-07 DIAGNOSIS — Z792 Long term (current) use of antibiotics: Secondary | ICD-10-CM | POA: Insufficient documentation

## 2014-12-07 DIAGNOSIS — K9400 Colostomy complication, unspecified: Secondary | ICD-10-CM

## 2014-12-07 HISTORY — DX: Renal tubulo-interstitial disease, unspecified: N15.9

## 2014-12-07 HISTORY — DX: Artificial opening status, unspecified: Z93.9

## 2014-12-07 LAB — URINALYSIS, ROUTINE W REFLEX MICROSCOPIC
BILIRUBIN URINE: NEGATIVE
Glucose, UA: NEGATIVE mg/dL
Ketones, ur: NEGATIVE mg/dL
Nitrite: NEGATIVE
PROTEIN: 100 mg/dL — AB
Specific Gravity, Urine: 1.012 (ref 1.005–1.030)
UROBILINOGEN UA: 0.2 mg/dL (ref 0.0–1.0)
pH: 6.5 (ref 5.0–8.0)

## 2014-12-07 LAB — CBC WITH DIFFERENTIAL/PLATELET
Basophils Absolute: 0 10*3/uL (ref 0.0–0.1)
Basophils Relative: 0 % (ref 0–1)
EOS ABS: 0.1 10*3/uL (ref 0.0–0.7)
EOS PCT: 1 % (ref 0–5)
HCT: 27.4 % — ABNORMAL LOW (ref 36.0–46.0)
Hemoglobin: 8 g/dL — ABNORMAL LOW (ref 12.0–15.0)
LYMPHS ABS: 1.9 10*3/uL (ref 0.7–4.0)
LYMPHS PCT: 16 % (ref 12–46)
MCH: 23.4 pg — AB (ref 26.0–34.0)
MCHC: 29.2 g/dL — ABNORMAL LOW (ref 30.0–36.0)
MCV: 80.1 fL (ref 78.0–100.0)
Monocytes Absolute: 1.1 10*3/uL — ABNORMAL HIGH (ref 0.1–1.0)
Monocytes Relative: 9 % (ref 3–12)
Neutro Abs: 9.2 10*3/uL — ABNORMAL HIGH (ref 1.7–7.7)
Neutrophils Relative %: 74 % (ref 43–77)
Platelets: 884 10*3/uL — ABNORMAL HIGH (ref 150–400)
RBC: 3.42 MIL/uL — AB (ref 3.87–5.11)
RDW: 18.3 % — AB (ref 11.5–15.5)
WBC: 12.4 10*3/uL — AB (ref 4.0–10.5)

## 2014-12-07 LAB — COMPREHENSIVE METABOLIC PANEL
ALT: 28 U/L (ref 14–54)
ANION GAP: 11 (ref 5–15)
AST: 37 U/L (ref 15–41)
Albumin: 2.7 g/dL — ABNORMAL LOW (ref 3.5–5.0)
Alkaline Phosphatase: 91 U/L (ref 38–126)
BUN: 19 mg/dL (ref 6–20)
CHLORIDE: 97 mmol/L — AB (ref 101–111)
CO2: 26 mmol/L (ref 22–32)
Calcium: 9.4 mg/dL (ref 8.9–10.3)
Creatinine, Ser: 1.98 mg/dL — ABNORMAL HIGH (ref 0.44–1.00)
GFR, EST AFRICAN AMERICAN: 35 mL/min — AB (ref >60)
GFR, EST NON AFRICAN AMERICAN: 30 mL/min — AB (ref >60)
Glucose, Bld: 117 mg/dL — ABNORMAL HIGH (ref 65–99)
POTASSIUM: 4.2 mmol/L (ref 3.5–5.1)
SODIUM: 134 mmol/L — AB (ref 135–145)
Total Bilirubin: 0.3 mg/dL (ref 0.3–1.2)
Total Protein: 8.1 g/dL (ref 6.5–8.1)

## 2014-12-07 LAB — URINE MICROSCOPIC-ADD ON

## 2014-12-07 MED ORDER — HYDROMORPHONE HCL 1 MG/ML IJ SOLN
1.0000 mg | Freq: Once | INTRAMUSCULAR | Status: AC
  Administered 2014-12-07: 1 mg via INTRAVENOUS
  Filled 2014-12-07: qty 1

## 2014-12-07 MED ORDER — FENTANYL CITRATE (PF) 100 MCG/2ML IJ SOLN: 100.0000 ug | Freq: Once | INTRAMUSCULAR | Status: DC

## 2014-12-07 MED ORDER — FENTANYL CITRATE (PF) 100 MCG/2ML IJ SOLN
100.0000 ug | Freq: Once | INTRAMUSCULAR | Status: AC
  Administered 2014-12-07: 100 ug via INTRAVENOUS
  Filled 2014-12-07: qty 2

## 2014-12-07 MED ORDER — ONDANSETRON HCL 4 MG/2ML IJ SOLN
4.0000 mg | Freq: Once | INTRAMUSCULAR | Status: AC
  Administered 2014-12-07: 4 mg via INTRAVENOUS
  Filled 2014-12-07: qty 2

## 2014-12-07 MED ORDER — SODIUM CHLORIDE 0.9 % IV SOLN
INTRAVENOUS | Status: DC
  Administered 2014-12-07: 04:00:00 via INTRAVENOUS

## 2014-12-07 MED ORDER — IOHEXOL 300 MG/ML  SOLN
25.0000 mL | Freq: Once | INTRAMUSCULAR | Status: AC | PRN
  Administered 2014-12-07: 25 mL via ORAL

## 2014-12-07 MED ORDER — SODIUM CHLORIDE 0.9 % IV BOLUS (SEPSIS)
500.0000 mL | Freq: Once | INTRAVENOUS | Status: AC
  Administered 2014-12-07: 500 mL via INTRAVENOUS

## 2014-12-07 NOTE — ED Notes (Signed)
Dr. Florina Ou and RN in to speak with pt about results and plan. Pt agreeable.

## 2014-12-07 NOTE — ED Notes (Signed)
To CT via stretcher, NAD, calm, interactive, "feel better".

## 2014-12-07 NOTE — ED Notes (Signed)
Pt states she was recently d/c'd from [facility] with kidney infection, yesterday she noticed no output from ostomy and this am awoke in severe pain

## 2014-12-07 NOTE — ED Notes (Signed)
C/o diffuse abd pain, pinpoints to around stoma, ostomy bag empty, "concern for blockage d/t recent admission with lots of zofran and pain meds", states, "I have a port but they have problems using it and getting blood from it", also reports nausea. (denies: nausea, bleeding, fever, dizziness, urinary or vaginal sx, or other sx)

## 2014-12-07 NOTE — ED Notes (Signed)
Back from CT, tolerated well, no changes, "feel OK", rates pain 6/10. "No nausea at this time, trying to stay still", IVF bolus initiated, CBIR. Pt aware of need for urine sample.

## 2014-12-07 NOTE — ED Provider Notes (Addendum)
CSN: [id number]     Arrival date & time 12/07/14  0253 History   First MD Initiated Contact with Patient 12/07/14 0303     Chief Complaint  Patient presents with  . Flank Pain     (Consider location/radiation/quality/duration/timing/severity/associated sxs/prior Treatment) HPI  This is a 44 year old female with a history of colon cancer status post colostomy. 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 She was admitted to [facility] on August 12 for urosepsis and was discharged home August 18; her urine culture grew out Escherichia coli sensitive to most antibiotics tested including the Augmentin on which she was discharged home. She is here with lack of colostomy output since yesterday morning accompanied by severe pain deep to her colostomy. Pain is worse with movement or palpation. She has not gotten relief with laxatives. Her urinary symptoms are resolved. She has been nauseated but has not been vomiting.  The patient is a chronic pain patient of [facility] and has been forthcoming about it and has expressed a desire to remain compliant with her contract.  Past Medical History  Diagnosis Date  . Colorectal cancer     colo-rectal ca   . Primary appendiceal adenocarcinoma   . Kidney infection   . History of creation of ostomy    Past Surgical History  Procedure Laterality Date  . Abdominal surgery    . Ostomy take down    . Rectoperitoneal fistula closure    . Renal artery stent     History reviewed. No pertinent family history. Social History  Substance Use Topics  . Smoking status: Never Smoker   . Smokeless tobacco: None  . Alcohol Use: No   OB History    No data available     Review of Systems  All other systems reviewed and are negative.   Allergies  Compazine and Latex  Home Medications   Prior to Admission medications    Medication Sig Start Date End Date Taking? Authorizing Provider  amoxicillin (AMOXIL) 125 MG chewable tablet Chew 125 mg by mouth 3 (three) times daily.    Yes Historical Provider, MD  fentaNYL (DURAGESIC - DOSED MCG/HR) 25 MCG/HR Place 25 mcg onto the skin every 3 (three) days.    Yes Historical Provider, MD  gabapentin (NEURONTIN) 300 MG capsule Take 300 mg by mouth 3 (three) times daily. 07/13/13  Yes Historical Provider, MD  oxybutynin (DITROPAN) 5 MG tablet Take 5 mg by mouth 3 (three) times daily.   Yes Historical Provider, MD  tamsulosin (FLOMAX) 0.4 MG CAPS capsule Take 0.4 mg by mouth daily as needed (soreness).    Historical Provider, MD  zolpidem (AMBIEN) 10 MG tablet Take 1 tablet (10 mg total) by mouth at bedtime as needed for sleep. 12/22/12 01/21/13  Wyatt Portela, MD  zolpidem (AMBIEN) 10 MG tablet Take 1 tablet (10 mg total) by mouth at bedtime as needed for sleep. 07/27/13   Wyatt Portela, MD   BP 99/60 mmHg  Pulse 84  Temp(Src) 98.6 F (37 C) (Oral)  Resp 18  Ht 5\' 2"  (1.575 m)  Wt 122 lb (55.339 kg)  BMI 22.31 kg/m2  SpO2 100%   Physical Exam  General: Well-developed, well-nourished female in no acute distress; appearance consistent with age of record HENT: normocephalic; atraumatic Eyes: pupils equal, round and reactive to light; extraocular muscles intact Neck: supple Heart: regular rate  and rhythm; tachycardic Lungs: clear to auscultation bilaterally Abdomen: soft; nondistended; diffusely tender most prominently in the vicinity of the colostomy in right lower quadrant; bowel sounds present GU: Urine grossly purulent, opaque (the patient states that it was likely contaminated with rectovaginal mucosal discharge which is chronic) Extremities: No deformity; full range of motion; pulses normal Neurologic: Awake, alert and oriented; motor function intact in all extremities and symmetric; no facial droop Skin: Warm and dry Psychiatric: Tearful; anxious  ED Course   Procedures (including critical care time)   MDM  Nursing notes and vitals signs, including pulse oximetry, reviewed.  Summary of this visit's results, reviewed by myself:  Labs:  Results for orders placed or performed during the hospital encounter of 12/07/14 (from the past 24 hour(s))  CBC with Differential/Platelet     Status: Abnormal   Collection Time: 12/07/14  3:48 AM  Result Value Ref Range   WBC 12.4 (H) 4.0 - 10.5 K/uL   RBC 3.42 (L) 3.87 - 5.11 MIL/uL   Hemoglobin 8.0 (L) 12.0 - 15.0 g/dL   HCT 27.4 (L) 36.0 - 46.0 %   MCV 80.1 78.0 - 100.0 fL   MCH 23.4 (L) 26.0 - 34.0 pg   MCHC 29.2 (L) 30.0 - 36.0 g/dL   RDW 18.3 (H) 11.5 - 15.5 %   Platelets 884 (H) 150 - 400 K/uL   Neutrophils Relative % 74 43 - 77 %   Neutro Abs 9.2 (H) 1.7 - 7.7 K/uL   Lymphocytes Relative 16 12 - 46 %   Lymphs Abs 1.9 0.7 - 4.0 K/uL   Monocytes Relative 9 3 - 12 %   Monocytes Absolute 1.1 (H) 0.1 - 1.0 K/uL   Eosinophils Relative 1 0 - 5 %   Eosinophils Absolute 0.1 0.0 - 0.7 K/uL   Basophils Relative 0 0 - 1 %   Basophils Absolute 0.0 0.0 - 0.1 K/uL  Comprehensive metabolic panel     Status: Abnormal   Collection Time: 12/07/14  3:48 AM  Result Value Ref Range   Sodium 134 (L) 135 - 145 mmol/L   Potassium 4.2 3.5 - 5.1 mmol/L   Chloride 97 (L) 101 - 111 mmol/L   CO2 26 22 - 32 mmol/L   Glucose, Bld 117 (H) 65 - 99 mg/dL   BUN 19 6 - 20 mg/dL   Creatinine, Ser 1.98 (H) 0.44 - 1.00 mg/dL   Calcium 9.4 8.9 - 10.3 mg/dL   Total Protein 8.1 6.5 - 8.1 g/dL   Albumin 2.7 (L) 3.5 - 5.0 g/dL   AST 37 15 - 41 U/L   ALT 28 14 - 54 U/L   Alkaline Phosphatase 91 38 - 126 U/L   Total Bilirubin 0.3 0.3 - 1.2 mg/dL   GFR calc non Af Amer 30 (L) >60 mL/min   GFR calc Af Amer 35 (L) >60 mL/min   Anion gap 11 5 - 15  Urinalysis, Routine w reflex microscopic (not at [facility])     Status: Abnormal   Collection Time: 12/07/14  6:02 AM  Result Value Ref Range   Color, Urine YELLOW YELLOW   APPearance  TURBID (A) CLEAR   Specific Gravity, Urine 1.012 1.005 - 1.030   pH 6.5 5.0 - 8.0   Glucose, UA NEGATIVE NEGATIVE mg/dL   Hgb urine dipstick LARGE (A) NEGATIVE   Bilirubin Urine NEGATIVE NEGATIVE   Ketones, ur NEGATIVE NEGATIVE mg/dL   Protein, ur 100 (A) NEGATIVE mg/dL   Urobilinogen, UA 0.2  0.0 - 1.0 mg/dL   Nitrite NEGATIVE NEGATIVE   Leukocytes, UA LARGE (A) NEGATIVE  Urine microscopic-add on     Status: Abnormal   Collection Time: 12/07/14  6:02 AM  Result Value Ref Range   Squamous Epithelial / LPF RARE RARE   WBC, UA TOO NUMEROUS TO COUNT <3 WBC/hpf   RBC / HPF TOO NUMEROUS TO COUNT <3 RBC/hpf   Bacteria, UA MANY (A) RARE    Imaging Studies: Ct Abdomen Pelvis Wo Contrast  12/07/2014   CLINICAL DATA:  Right lower quadrant abdominal pain. History of primary appendiceal cancer with ostomy.  EXAM: CT ABDOMEN AND PELVIS WITHOUT CONTRAST  TECHNIQUE: Multidetector CT imaging of the abdomen and pelvis was performed following the standard protocol without IV contrast.  COMPARISON:  CT 05/31/2013  FINDINGS: Lower chest:  The included lung bases are clear.  Liver: The small subcapsular hypodensities on prior exam are not well seen given lack of contrast, however grossly stable.  Hepatobiliary: Gallbladder is physiologically distended. No evidence of biliary dilatation, common bile duct suboptimally defined.  Pancreas: No ductal dilatation or inflammation.  Spleen: Normal in size. Splenic hemangioma is not seen without contrast.  Adrenal glands: No nodule.  Kidneys: Bilateral ureteral stents in place. Despite ureteral stenting, there is bilateral hydronephrosis, left greater than right. No definite perinephric stranding. No urolithiasis or stones along the course of the stent.  Stomach/Bowel: Post colonic resection. Stomach is distended with contrast, oral contrast in the distal esophagus. There is progressive small bowel dilatation with fluid-filled small bowel loops in the lower abdomen measuring  up to 3 cm. Enteric sutures in the left mid abdomen with distended bowel loop, appears similar in degree of distention from prior exam. Additional enteric sutures involving bowel loops in the right abdomen. There is stool distending bowl in the right abdomen that is prominent, stool extends to the ostomy. No significant bowel wall thickening or inflammation.  Vascular/Lymphatic: Abdominal aorta is normal in caliber. Small retroperitoneal soft tissue densities without definite adenopathy. Assessment for mesenteric adenopathy is limited given lack of contrast and paucity of intra-abdominal fat.  Reproductive: The uterus is surgically absent.  Bladder: Physiologically distended mild bladder wall thickening.  Other: Presacral pelvic fluid collection measures approximately 10 x 10 cm, previously 7.5 x 6.3 cm. The air component has resolved. The fluid collection extends to the region of the vaginal cuff. No new intra-abdominal fluid collection.  Musculoskeletal: There are no acute or suspicious osseous abnormalities.  IMPRESSION: 1. Post colonic resection. Ostomy in the right lower quadrant with moderate volume of stool distending the bowel loops just proximal to the ostomy and in the right lower quadrant and pelvis. Multiple enteric anastomoses, with prominent small bowel loops in the central abdomen measuring 2.9 cm. Findings may reflect slow transit versus early obstruction. 2. Chronic presacral fluid collection has increased in size from prior exam from 1 year prior, currently measuring 10 x 10 cm. Resolution of the air component. No new intra-abdominal fluid collection. 3. Bilateral ureteral stents in place, however bilateral hydronephrosis. No definite perinephric stranding. No stones along the course of the stent. Mild bladder wall thickening, correlation with urinalysis recommended.   Electronically Signed   By: Jeb Levering [name]   On: 12/07/2014 06:00   6:49 AM The patient's pain continues to wax and wane.  She has had some soft ostomy output. She was advised of CT findings. I do not believe that admission to the hospital is indicated at this time and the  patient herself does not wish to be admitted. She will take another dose of MiraLAX today and will contact Dr. Georgina Snell office (her surgical oncologist) at [facility] regarding possible irrigation of the ostomy. She has an appointment with her pain management physician this afternoon. We will provide her a copy of her CT scan and advise her that if she needs to be seen again, should her symptoms worsen, that she go to the emergency department at [place], MD 12/07/14 [place], MD 12/07/14 [id number]  [name], MD 12/07/14 [phone]

## 2015-02-24 ENCOUNTER — Emergency Department (HOSPITAL_BASED_OUTPATIENT_CLINIC_OR_DEPARTMENT_OTHER)
Admission: EM | Admit: 2015-02-24 | Discharge: 2015-02-24 | Disposition: A | Discharge status: Home / Self Care | Payer: Medicare Other | Attending: Emergency Medicine | Admitting: Emergency Medicine

## 2015-02-24 ENCOUNTER — Encounter (HOSPITAL_BASED_OUTPATIENT_CLINIC_OR_DEPARTMENT_OTHER): Payer: Self-pay | Admitting: *Deleted

## 2015-02-24 DIAGNOSIS — Z85048 Personal history of other malignant neoplasm of rectum, rectosigmoid junction, and anus: Secondary | ICD-10-CM | POA: Insufficient documentation

## 2015-02-24 DIAGNOSIS — Z79899 Other long term (current) drug therapy: Secondary | ICD-10-CM | POA: Insufficient documentation

## 2015-02-24 DIAGNOSIS — Z91040 Latex allergy status: Secondary | ICD-10-CM | POA: Insufficient documentation

## 2015-02-24 DIAGNOSIS — N390 Urinary tract infection, site not specified: Secondary | ICD-10-CM | POA: Diagnosis not present

## 2015-02-24 DIAGNOSIS — Z792 Long term (current) use of antibiotics: Secondary | ICD-10-CM | POA: Insufficient documentation

## 2015-02-24 DIAGNOSIS — R300 Dysuria: Secondary | ICD-10-CM | POA: Diagnosis present

## 2015-02-24 DIAGNOSIS — Z9889 Other specified postprocedural states: Secondary | ICD-10-CM | POA: Diagnosis not present

## 2015-02-24 DIAGNOSIS — K130 Diseases of lips: Secondary | ICD-10-CM | POA: Insufficient documentation

## 2015-02-24 LAB — CBC WITH DIFFERENTIAL/PLATELET
Basophils Absolute: 0 10*3/uL (ref 0.0–0.1)
Basophils Relative: 0 %
Eosinophils Absolute: 0.1 10*3/uL (ref 0.0–0.7)
Eosinophils Relative: 1 %
HCT: 29.5 % — ABNORMAL LOW (ref 36.0–46.0)
Hemoglobin: 8.4 g/dL — ABNORMAL LOW (ref 12.0–15.0)
LYMPHS ABS: 2.7 10*3/uL (ref 0.7–4.0)
Lymphocytes Relative: 22 %
MCH: 21.7 pg — AB (ref 26.0–34.0)
MCHC: 28.5 g/dL — AB (ref 30.0–36.0)
MCV: 76.2 fL — ABNORMAL LOW (ref 78.0–100.0)
MONO ABS: 1.4 10*3/uL — AB (ref 0.1–1.0)
MONOS PCT: 11 %
Neutro Abs: 8.2 10*3/uL — ABNORMAL HIGH (ref 1.7–7.7)
Neutrophils Relative %: 66 %
PLATELETS: 942 10*3/uL — AB (ref 150–400)
RBC: 3.87 MIL/uL (ref 3.87–5.11)
RDW: 21.5 % — AB (ref 11.5–15.5)
SMEAR REVIEW: INCREASED
WBC: 12.4 10*3/uL — AB (ref 4.0–10.5)

## 2015-02-24 LAB — URINE MICROSCOPIC-ADD ON

## 2015-02-24 LAB — BASIC METABOLIC PANEL
Anion gap: 13 (ref 5–15)
BUN: 20 mg/dL (ref 6–20)
CO2: 24 mmol/L (ref 22–32)
CREATININE: 1.71 mg/dL — AB (ref 0.44–1.00)
Calcium: 9.6 mg/dL (ref 8.9–10.3)
Chloride: 99 mmol/L — ABNORMAL LOW (ref 101–111)
GFR calc Af Amer: 41 mL/min — ABNORMAL LOW (ref >60)
GFR, EST NON AFRICAN AMERICAN: 36 mL/min — AB (ref >60)
Glucose, Bld: 104 mg/dL — ABNORMAL HIGH (ref 65–99)
Potassium: 3.8 mmol/L (ref 3.5–5.1)
SODIUM: 136 mmol/L (ref 135–145)

## 2015-02-24 LAB — URINALYSIS, ROUTINE W REFLEX MICROSCOPIC
Bilirubin Urine: NEGATIVE
Glucose, UA: NEGATIVE mg/dL
KETONES UR: NEGATIVE mg/dL
NITRITE: POSITIVE — AB
PH: 6.5 (ref 5.0–8.0)
Protein, ur: >300 mg/dL — AB
Specific Gravity, Urine: 1.019 (ref 1.005–1.030)
Urobilinogen, UA: 0.2 mg/dL (ref 0.0–1.0)

## 2015-02-24 MED ORDER — ONDANSETRON HCL 4 MG/2ML IJ SOLN
4.0000 mg | Freq: Once | INTRAMUSCULAR | Status: AC
  Administered 2015-02-24: 4 mg via INTRAVENOUS
  Filled 2015-02-24: qty 2

## 2015-02-24 MED ORDER — HYDROMORPHONE HCL 1 MG/ML IJ SOLN
INTRAMUSCULAR | Status: AC
  Filled 2015-02-24: qty 1

## 2015-02-24 MED ORDER — FLUCONAZOLE 50 MG PO TABS
150.0000 mg | ORAL_TABLET | Freq: Once | ORAL | Status: AC
  Administered 2015-02-24: 150 mg via ORAL
  Filled 2015-02-24 (×2): qty 1

## 2015-02-24 MED ORDER — SODIUM CHLORIDE 0.9 % IV SOLN
INTRAVENOUS | Status: DC
  Administered 2015-02-24: 02:00:00 via INTRAVENOUS

## 2015-02-24 MED ORDER — FLUCONAZOLE 150 MG PO TABS: ORAL_TABLET | ORAL | Status: AC

## 2015-02-24 MED ORDER — HYDROMORPHONE HCL 1 MG/ML IJ SOLN
1.0000 mg | Freq: Once | INTRAMUSCULAR | Status: AC
  Administered 2015-02-24: 1 mg via INTRAVENOUS

## 2015-02-24 MED ORDER — DEXTROSE 5 % IV SOLN
1.0000 g | Freq: Once | INTRAVENOUS | Status: AC
  Administered 2015-02-24: 1 g via INTRAVENOUS

## 2015-02-24 MED ORDER — FENTANYL CITRATE (PF) 100 MCG/2ML IJ SOLN
100.0000 ug | Freq: Once | INTRAMUSCULAR | Status: AC
  Administered 2015-02-24: 100 ug via INTRAVENOUS
  Filled 2015-02-24: qty 2

## 2015-02-24 MED ORDER — CEFTRIAXONE SODIUM 1 G IJ SOLR
INTRAMUSCULAR | Status: AC
  Filled 2015-02-24: qty 10

## 2015-02-24 MED ORDER — CEPHALEXIN 250 MG/5ML PO SUSR: 500.0000 mg | Freq: Three times a day (TID) | ORAL | Status: AC

## 2015-02-24 NOTE — ED Provider Notes (Signed)
CSN: [id number]   Arrival date & time 02/24/15 0000  History  By signing my name below, I, Heather Mayer, attest that this documentation has been prepared under the direction and in the presence of [name], MD. Electronically Signed: Altamease Mayer, ED Scribe. 02/24/2015. 12:32 AM.  Chief Complaint  Patient presents with  . Dysuria    HPI The history is provided by the patient. No language interpreter was used.   Heather Mayer is a 44 y.o. 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 Associated symptoms include chills, dysuria, and nausea. She was seen 1 month ago at Urgent Care for the symptoms and discharged with Cipro for a UTI. Pt states that she has had redness and pain in the mouth and throat for approximately 1 month that prevented her from taking more than 1 dose of the Cipro. Pt denies fever or chills. She has generalized malaise and occasional nausea. Symptoms are moderate to severe.   Past Medical History  Diagnosis Date  . Colorectal cancer ([place])     colo-rectal ca   . Primary appendiceal adenocarcinoma ([place])   . Kidney infection   . History of creation of ostomy     Past Surgical History  Procedure Laterality Date  . Abdominal surgery    . Ostomy take down    . Rectoperitoneal fistula closure    . Renal artery stent      History reviewed. No pertinent family history.  Social History  Substance Use Topics  . Smoking status: Never Smoker   . Smokeless tobacco: None  . Alcohol Use: No     Review of Systems  10 Systems reviewed and all are negative for acute change except as noted in the HPI. Home Medications   Prior to Admission medications   Medication Sig Start Date End Date Taking? Authorizing Provider   amoxicillin (AMOXIL) 125 MG chewable tablet Chew 125 mg by mouth 3 (three) times daily.     Historical Provider, MD  fentaNYL (DURAGESIC - DOSED MCG/HR) 25 MCG/HR Place 25 mcg onto the skin every 3 (three) days.     Historical Provider, MD  gabapentin (NEURONTIN) 300 MG capsule Take 300 mg by mouth 3 (three) times daily. 07/13/13   Historical Provider, MD  oxybutynin (DITROPAN) 5 MG tablet Take 5 mg by mouth 3 (three) times daily.    Historical Provider, MD  tamsulosin (FLOMAX) 0.4 MG CAPS capsule Take 0.4 mg by mouth daily as needed (soreness).    Historical Provider, MD  zolpidem (AMBIEN) 10 MG tablet Take 1 tablet (10 mg total) by mouth at bedtime as needed for sleep. 12/22/12 01/21/13  Wyatt Portela, MD  zolpidem (AMBIEN) 10 MG tablet Take 1 tablet (10 mg total) by mouth at bedtime as needed for sleep. 07/27/13   Wyatt Portela, MD    Allergies  Compazine and Latex  Triage Vitals: BP 106/79 mmHg  Pulse 102  Temp(Src) 98.4 F (36.9 C) (Oral)  Resp 18  SpO2 100%  Physical Exam General: Well-developed, well-nourished female in no acute distress; appearance consistent with age of record HENT: normocephalic; atraumatic; no intraoral lesions notes; angular cheilitis  Eyes: pupils equal, round and reactive to light; extraocular muscles intact Neck: supple Heart: regular rate and rhythm Lungs: clear to auscultation bilaterally Abdomen: soft; nondistended; suprapubic tenderness,  colostomy noted in RLQ, ; no masses or hepatosplenomegaly; bowel sounds present GU: Mild bilateral CVA tenderness Extremities: No deformity; full range of motion; pulses normal Neurologic: Awake, alert and oriented; motor function intact in all extremities and symmetric; no facial droop Skin: Warm and dry Psychiatric: Flat affect  ED Course  Procedures   DIAGNOSTIC STUDIES: Oxygen Saturation is 100% on RA, normal by my interpretation.    COORDINATION OF CARE: 12:30 AM Discussed treatment plan which includes  lab work and pain management with pt at bedside and pt agreed to plan.   MDM   Nursing notes and vitals signs, including pulse oximetry, reviewed.  Summary of this visit's results, reviewed by myself:  Labs:  Results for orders placed or performed during the hospital encounter of 02/24/15 (from the past 24 hour(s))  Urinalysis, Routine w reflex microscopic (not at [facility])     Status: Abnormal   Collection Time: 02/24/15 12:10 AM  Result Value Ref Range   Color, Urine YELLOW YELLOW   APPearance TURBID (A) CLEAR   Specific Gravity, Urine 1.019 1.005 - 1.030   pH 6.5 5.0 - 8.0   Glucose, UA NEGATIVE NEGATIVE mg/dL   Hgb urine dipstick LARGE (A) NEGATIVE   Bilirubin Urine NEGATIVE NEGATIVE   Ketones, ur NEGATIVE NEGATIVE mg/dL   Protein, ur >300 (A) NEGATIVE mg/dL   Urobilinogen, UA 0.2 0.0 - 1.0 mg/dL   Nitrite POSITIVE (A) NEGATIVE   Leukocytes, UA LARGE (A) NEGATIVE  Urine microscopic-add on     Status: Abnormal   Collection Time: 02/24/15 12:10 AM  Result Value Ref Range   Squamous Epithelial / LPF FEW (A) RARE   WBC, UA TOO NUMEROUS TO COUNT <3 WBC/hpf   RBC / HPF TOO NUMEROUS TO COUNT <3 RBC/hpf   Bacteria, UA MANY (A) RARE   Urine-Other MANY YEAST   CBC with Differential/Platelet     Status: Abnormal (Preliminary result)   Collection Time: 02/24/15  1:23 AM  Result Value Ref Range   WBC 12.4 (H) 4.0 - 10.5 K/uL   RBC 3.87 3.87 - 5.11 MIL/uL   Hemoglobin 8.4 (L) 12.0 - 15.0 g/dL   HCT 29.5 (L) 36.0 - 46.0 %   MCV 76.2 (L) 78.0 - 100.0 fL   MCH 21.7 (L) 26.0 - 34.0 pg   MCHC 28.5 (L) 30.0 - 36.0 g/dL   RDW 21.5 (H) 11.5 - 15.5 %   Platelets PENDING 150 - 400 K/uL   Neutrophils Relative % 66 %   Lymphocytes Relative 22 %   Monocytes Relative 11 %   Eosinophils Relative 1 %   Basophils Relative 0 %   Neutro Abs 8.2 (H) 1.7 - 7.7 K/uL   Lymphs Abs 2.7 0.7 - 4.0 K/uL   Monocytes Absolute 1.4 (H) 0.1 - 1.0 K/uL   Eosinophils Absolute 0.1 0.0 - 0.7 K/uL   Basophils  Absolute 0.0 0.0 - 0.1 K/uL   RBC Morphology POLYCHROMASIA PRESENT    WBC Morphology TOXIC GRANULATION    Smear Review PLATELETS APPEAR INCREASED   Basic metabolic panel     Status: Abnormal   Collection Time: 02/24/15  1:23 AM  Result Value Ref Range   Sodium 136 135 - 145 mmol/L   Potassium 3.8 3.5 - 5.1 mmol/L   Chloride 99 (L) 101 - 111 mmol/L   CO2 24 22 - 32 mmol/L   Glucose, Bld 104 (H) 65 - 99 mg/dL   BUN 20 6 - 20 mg/dL   Creatinine, Ser  1.71 (H) 0.44 - 1.00 mg/dL   Calcium 9.6 8.9 - 10.3 mg/dL   GFR calc non Af Amer 36 (L) >60 mL/min   GFR calc Af Amer 41 (L) >60 mL/min   Anion gap 13 5 - 15   2:06 AM The patient was unable to tolerate her Cipro. She crush the tablets and found the taste unbearable and she vomited it back up. Her UTI is likely chronic in the context of bilateral ureteral stents so we will treat for prolonged course. We will use Keflex liquid as she is having difficulty swallowing tablets or capsules. Urine has been sent for culture.  I personally performed the services described in this documentation, which was scribed in my presence. The recorded information has been reviewed and is accurate.   [name], MD 02/24/15 [phone]

## 2015-02-24 NOTE — ED Notes (Signed)
C/o pain with urination, blood in urine, back pain R>L, sore throat, can't eat, drink or swallow, chills and nausea. (denies: known fever, v,d), seen last month for the same, rates pain 10/10. States, "haven't been able to take my gabapentin percocet or cipro appropriately (d/t difficulty swallowing).

## 2015-02-24 NOTE — ED Notes (Signed)
MD aware of elevated platelet count.

## 2015-02-25 LAB — URINE CULTURE: Culture: NO GROWTH

## 2015-02-25 LAB — PATHOLOGIST SMEAR REVIEW

## 2015-02-26 ENCOUNTER — Encounter (HOSPITAL_COMMUNITY): Payer: Self-pay | Admitting: Emergency Medicine

## 2015-02-26 ENCOUNTER — Emergency Department (HOSPITAL_COMMUNITY)
Admission: EM | Admit: 2015-02-26 | Discharge: 2015-02-27 | Disposition: A | Discharge status: Other Acute Inpatient Hospital | Payer: Medicare Other | Attending: Emergency Medicine | Admitting: Emergency Medicine

## 2015-02-26 DIAGNOSIS — Z87898 Personal history of other specified conditions: Secondary | ICD-10-CM | POA: Insufficient documentation

## 2015-02-26 DIAGNOSIS — Z79899 Other long term (current) drug therapy: Secondary | ICD-10-CM | POA: Diagnosis not present

## 2015-02-26 DIAGNOSIS — N1330 Unspecified hydronephrosis: Secondary | ICD-10-CM | POA: Diagnosis not present

## 2015-02-26 DIAGNOSIS — N739 Female pelvic inflammatory disease, unspecified: Secondary | ICD-10-CM | POA: Insufficient documentation

## 2015-02-26 DIAGNOSIS — N390 Urinary tract infection, site not specified: Secondary | ICD-10-CM | POA: Insufficient documentation

## 2015-02-26 DIAGNOSIS — D649 Anemia, unspecified: Secondary | ICD-10-CM | POA: Insufficient documentation

## 2015-02-26 DIAGNOSIS — Z91040 Latex allergy status: Secondary | ICD-10-CM | POA: Diagnosis not present

## 2015-02-26 DIAGNOSIS — Z79891 Long term (current) use of opiate analgesic: Secondary | ICD-10-CM | POA: Insufficient documentation

## 2015-02-26 DIAGNOSIS — R300 Dysuria: Secondary | ICD-10-CM | POA: Diagnosis present

## 2015-02-26 DIAGNOSIS — Z85038 Personal history of other malignant neoplasm of large intestine: Secondary | ICD-10-CM | POA: Diagnosis not present

## 2015-02-26 MED ORDER — SODIUM CHLORIDE 0.9 % IV BOLUS (SEPSIS)
500.0000 mL | Freq: Once | INTRAVENOUS | Status: AC
  Administered 2015-02-27: 500 mL via INTRAVENOUS

## 2015-02-26 MED ORDER — MORPHINE SULFATE (PF) 4 MG/ML IV SOLN
4.0000 mg | Freq: Once | INTRAVENOUS | Status: AC
  Administered 2015-02-27: 4 mg via INTRAVENOUS

## 2015-02-26 MED ORDER — ONDANSETRON HCL 4 MG/2ML IJ SOLN
4.0000 mg | Freq: Once | INTRAMUSCULAR | Status: AC
  Administered 2015-02-27: 4 mg via INTRAVENOUS

## 2015-02-26 NOTE — ED Provider Notes (Signed)
CSN: [id number]     Arrival date & time 02/26/15  2235 History  By signing my name below, I, Eustaquio Maize, attest that this documentation has been prepared under the direction and in the presence of Orpah Greek, MD. Electronically Signed: Eustaquio Maize, ED Scribe. 02/26/2015. 11:59 PM.  Chief Complaint  Patient presents with  . Dysuria   The history is provided by the patient. No language interpreter was used.     HPI Comments: Heather Mayer is a 44 y.o. female who presents to the Emergency Department complaining of gradual onset, constant, suprapubic pain, bilateral flank pain, dysuria, and hematuria x 3 weeks, gradually worsening. Pt reports that 4 months ago she was uroseptic and was in the ICU for a week. Since then she has had intermittent episodes of UTI's. Pt was seen 2 days ago at [facility] for same symptoms and was given Rx for Keflex. She reports that since taking the Keflex her symptoms have only worsened. She denies fever, chills, nausea, vomiting, diarrhea, or any other associated symptoms. Pt does have renal artery stents that she gets changed every 3 months at [place] states she has not contacted her doctor at [facility] about her symptoms.   Past Medical History  Diagnosis Date  . 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 Kidney infection   . History of creation of ostomy    Past Surgical History  Procedure Laterality Date  . Abdominal surgery    . Ostomy take down    . Rectoperitoneal fistula closure    . Renal artery stent     History reviewed. No pertinent family history. Social History  Substance Use Topics  . Smoking status: Never Smoker   . Smokeless tobacco: None  . Alcohol Use: No   OB History    No data available     Review of Systems  Constitutional: Negative for fever and chills.  Gastrointestinal: Positive for abdominal pain. Negative for nausea, vomiting and diarrhea.   Genitourinary: Positive for dysuria, hematuria and flank pain.  All other systems reviewed and are negative.     Allergies  Compazine and Latex  Home Medications   Prior to Admission medications   Medication Sig Start Date End Date Taking? Authorizing Provider  calcium carbonate (TUMS - DOSED IN MG ELEMENTAL CALCIUM) 500 MG chewable tablet Chew 1 tablet by mouth daily.   Yes Historical Provider, MD  fentaNYL (DURAGESIC - DOSED MCG/HR) 25 MCG/HR Place 25 mcg onto the skin every 3 (three) days.    Yes Historical Provider, MD  gabapentin (NEURONTIN) 300 MG capsule Take 300 mg by mouth 3 (three) times daily. 07/13/13  Yes Historical Provider, MD  oxyCODONE-acetaminophen (PERCOCET) 10-325 MG tablet Take 1 tablet by mouth every 4 (four) hours as needed for pain.   Yes Historical Provider, MD  simethicone (MYLICON) 0000000 MG chewable tablet Chew 125 mg by mouth every 6 (six) hours as needed for flatulence.   Yes Historical Provider, MD  tamsulosin (FLOMAX) 0.4 MG CAPS capsule Take 0.4 mg by mouth daily as needed (soreness).   Yes Historical Provider, MD  cephALEXin (KEFLEX) 250 MG/5ML suspension Take 10 mLs (500 mg total) by mouth 3 (three) times daily. Patient not taking: Reported on 02/26/2015 02/24/15   [name], MD  fluconazole (DIFLUCAN) 150 MG tablet Take 1 tablet as needed for vaginal yeast infection. May repeat in 3 days if necessary. Patient not  taking: Reported on 02/26/2015 02/24/15   [name], MD  zolpidem (AMBIEN) 10 MG tablet Take 1 tablet (10 mg total) by mouth at bedtime as needed for sleep. 12/22/12 01/21/13  Wyatt Portela, MD  zolpidem (AMBIEN) 10 MG tablet Take 1 tablet (10 mg total) by mouth at bedtime as needed for sleep. Patient not taking: Reported on 02/26/2015 07/27/13   Wyatt Portela, MD   Triage Vitals: BP 100/57 mmHg  Pulse 101  Temp(Src) 97.9 F (36.6 C) (Oral)  Resp 22  SpO2 97%   Physical Exam  Constitutional: She is oriented to person, place, and time.  She appears well-developed and well-nourished. No distress.  HENT:  Head: Normocephalic and atraumatic.  Right Ear: Hearing normal.  Left Ear: Hearing normal.  Nose: Nose normal.  Mouth/Throat: Oropharynx is clear and moist and mucous membranes are normal.  Eyes: Conjunctivae and EOM are normal. Pupils are equal, round, and reactive to light.  Neck: Normal range of motion. Neck supple.  Cardiovascular: Regular rhythm, S1 normal and S2 normal.  Exam reveals no gallop and no friction rub.   No murmur heard. Pulmonary/Chest: Effort normal and breath sounds normal. No respiratory distress. She exhibits no tenderness.  Abdominal: Soft. Normal appearance and bowel sounds are normal. There is no hepatosplenomegaly. There is tenderness. There is no rebound, no guarding, no tenderness at McBurney's point and negative Murphy's sign. No hernia.  Suprapubic tenderness  Musculoskeletal: Normal range of motion.  Neurological: She is alert and oriented to person, place, and time. She has normal strength. No cranial nerve deficit or sensory deficit. Coordination normal. GCS eye subscore is 4. GCS verbal subscore is 5. GCS motor subscore is 6.  Skin: Skin is warm, dry and intact. No rash noted. No cyanosis.  Psychiatric: She has a normal mood and affect. Her speech is normal and behavior is normal. Thought content normal.  Nursing note and vitals reviewed.   ED Course  Procedures (including critical care time)  DIAGNOSTIC STUDIES: Oxygen Saturation is 97% on RA, normal by my interpretation.    COORDINATION OF CARE: 11:27 PM-Discussed treatment plan which includes CBC, CMP, Lactic acid, UA with pt at bedside and pt agreed to plan.   Labs Review Labs Reviewed  CBC WITH DIFFERENTIAL/PLATELET - Abnormal; Notable for the following:    WBC 13.9 (*)    RBC 3.48 (*)    Hemoglobin 7.6 (*)    HCT 26.5 (*)    MCV 76.1 (*)    MCH 21.8 (*)    MCHC 28.7 (*)    RDW 21.0 (*)    Platelets 811 (*)    Neutro  Abs 10.5 (*)    Monocytes Absolute 1.3 (*)    All other components within normal limits  COMPREHENSIVE METABOLIC PANEL - Abnormal; Notable for the following:    Potassium 3.3 (*)    Glucose, Bld 107 (*)    Creatinine, Ser 1.84 (*)    Albumin 2.5 (*)    AST 14 (*)    ALT 13 (*)    Total Bilirubin 0.1 (*)    GFR calc non Af Amer 33 (*)    GFR calc Af Amer 38 (*)    All other components within normal limits  URINALYSIS, ROUTINE W REFLEX MICROSCOPIC (NOT AT [facility]) - Abnormal; Notable for the following:    APPearance TURBID (*)    Hgb urine dipstick LARGE (*)    Protein, ur 100 (*)    Leukocytes, UA LARGE (*)  All other components within normal limits  URINE MICROSCOPIC-ADD ON - Abnormal; Notable for the following:    Squamous Epithelial / LPF 6-30 (*)    Bacteria, UA MANY (*)    All other components within normal limits  I-STAT CG4 LACTIC ACID, ED  I-STAT CG4 LACTIC ACID, ED    Imaging Review Ct Renal Stone Study  02/27/2015  CLINICAL DATA:  Acute onset of suprapubic abdominal pain and leukocytosis. Red blood cells and white blood cells in the urine. Dysuria and gross hematuria. Initial encounter. EXAM: CT ABDOMEN AND PELVIS WITHOUT CONTRAST TECHNIQUE: Multidetector CT imaging of the abdomen and pelvis was performed following the standard protocol without IV contrast. COMPARISON:  CT of the abdomen and pelvis performed 12/07/2014 FINDINGS: The minimally visualized lung bases are clear. The liver and spleen are unremarkable in appearance. Sludge or stones are suggested within the gallbladder. The gallbladder is otherwise unremarkable. The pancreas and adrenal glands are unremarkable. There is chronic moderate right-sided and mild left-sided hydronephrosis. Bilateral ureteral stents are noted in expected positions. The hydronephrosis is mildly more prominent than on the prior study. Mild nonspecific perinephric stranding is noted bilaterally. No renal or ureteral stones are identified. The  proximal small bowel is unremarkable in appearance. The stomach is within normal limits. No acute vascular abnormalities are seen. The patient's right lower quadrant colostomy is unremarkable in appearance. Postoperative change is noted about the lower quadrants bilaterally. New foci of air are noted within the chronic collection of fluid within the pelvis, which measures 10.8 x 10.8 cm, with mildly worsened surrounding soft tissue inflammation. This raises suspicion for recurrent infection, or possibly underlying fistula to the bowel. The patient is status post colonic resection. The bladder is diffusely thick walled, somewhat worsened from the prior study. This may reflect acute cystitis. The bladder and courses of both ureters are directly adjacent to the chronic fluid collection. No inguinal lymphadenopathy is seen. No acute osseous abnormalities are identified. IMPRESSION: 1. New foci of air noted within the chronic collection of fluid within the pelvis, which measures 10.8 x 10.8 cm, with mildly worsened surrounding soft tissue inflammation. This raises suspicion for recurrent infection, or possibly underlying fistula to the bowel. 2. Worsening diffuse bladder wall thickening may reflect acute cystitis, given the patient's symptoms. The bladder and courses of both ureters are directly adjacent to the chronic fluid collection. 3. Mildly worsened chronic moderate right-sided and mild left-sided hydronephrosis. Bilateral ureteral stents noted in expected positions. 4. Sludge or stones suggested within the gallbladder. Gallbladder otherwise unremarkable in appearance. Electronically Signed   By: Garald Balding [name]   On: 02/27/2015 03:34   I have personally reviewed and evaluated these lab results as part of my medical decision-making.   EKG Interpretation None      MDM   Final diagnoses:  None  hydronephrosis UTI Pelvic abscess   Complicated patient with very Complicated past medical history.  She presented to the ER for evaluation of dysuria, lower pelvic, abdominal and back pain. Patient reports a history of chronic ureteral stents and recurrent urinary tract infection. Patient reports that she did have urosepsis in August. She is starting to feel that she might be getting symptoms again. Patient was seen at urgent care several weeks ago and diagnosed with urinary tract infection, placed on Cipro. Patient had difficulty swallowing the pills and stopped. She was seen again on November 13 at [facility]. She was switched to Keflex liquid because she was having trouble swallowing the  Cipro pills. Patient reports that she has been taking the Keflex but it has not helped.  At arrival, patient complaining of severe pain in the lower pelvic area and in her bilateral flank regions. She is afebrile. Blood pressure ranging from 95 systolic to [id number] systolic. Reviewing multiple previous visit reveals that this is her normal blood pressure. She does not appear to be septic at this point. Urine culture from November 13 has no growth at 1 day.  CT scan performed to further evaluate the patient's stents. There is mild worsening of her chronic bilateral nephrosis, right greater than left. Stents are in expected positions. She does have worsening diffuse bladder wall thickening that could be consistent with cystitis.  More importantly, however, patient shows new foci of air within the collection of fluid that has been in her pelvis. Size is now 10.8 x 10.8 which is larger than previous and there is surrounding soft tissue inflammation. This is likely consistent with recurrent infection. 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  Patient also found to have worsening anemia. She does have a history of chronic anemia, but hemoglobin now down to 7.6. She was a point for 3 days ago, but it appears that her baseline is around  8. She has had hematuria and has in the past had intermittent rectal bleeding as well.  Patient is followed by urology and general surgery at [facility]. I believe patient will require hospitalization and she would benefit from being admitted to [facility] where her specialists are familiar with her.  Discussed with hospitalist at [facility], Dr. Dorna Bloom. She agreed that the patient would be best served being transferred to [facility], however, there are no beds currently available. It was agreed that the patient be initiated on antibiotics, kept in the ER until later this morning, will be transferred when bed available.  I personally performed the services described in this documentation, which was scribed in my presence. The recorded information has been reviewed and is accurate.      Orpah Greek, MD 02/27/15 [phone]

## 2015-02-26 NOTE — ED Notes (Addendum)
Pt states that she was uroseptic in August and feels like her bladder infection won't go away. 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 R sided flank pain. Was seen at [facility] on Saturday. Alert and oriented.

## 2015-02-27 ENCOUNTER — Emergency Department (HOSPITAL_COMMUNITY): Payer: Medicare Other

## 2015-02-27 DIAGNOSIS — Z87898 Personal history of other specified conditions: Secondary | ICD-10-CM | POA: Diagnosis not present

## 2015-02-27 DIAGNOSIS — R300 Dysuria: Secondary | ICD-10-CM | POA: Diagnosis present

## 2015-02-27 DIAGNOSIS — Z79899 Other long term (current) drug therapy: Secondary | ICD-10-CM | POA: Diagnosis not present

## 2015-02-27 DIAGNOSIS — N390 Urinary tract infection, site not specified: Secondary | ICD-10-CM | POA: Diagnosis not present

## 2015-02-27 DIAGNOSIS — D649 Anemia, unspecified: Secondary | ICD-10-CM | POA: Diagnosis not present

## 2015-02-27 DIAGNOSIS — Z79891 Long term (current) use of opiate analgesic: Secondary | ICD-10-CM | POA: Diagnosis not present

## 2015-02-27 DIAGNOSIS — Z91040 Latex allergy status: Secondary | ICD-10-CM | POA: Diagnosis not present

## 2015-02-27 DIAGNOSIS — N739 Female pelvic inflammatory disease, unspecified: Secondary | ICD-10-CM | POA: Diagnosis not present

## 2015-02-27 DIAGNOSIS — N1330 Unspecified hydronephrosis: Secondary | ICD-10-CM | POA: Diagnosis not present

## 2015-02-27 DIAGNOSIS — Z85038 Personal history of other malignant neoplasm of large intestine: Secondary | ICD-10-CM | POA: Diagnosis not present

## 2015-02-27 LAB — URINALYSIS, ROUTINE W REFLEX MICROSCOPIC
BILIRUBIN URINE: NEGATIVE
Glucose, UA: NEGATIVE mg/dL
Ketones, ur: NEGATIVE mg/dL
Nitrite: NEGATIVE
PROTEIN: 100 mg/dL — AB
Specific Gravity, Urine: 1.006 (ref 1.005–1.030)
pH: 6.5 (ref 5.0–8.0)

## 2015-02-27 LAB — CBC WITH DIFFERENTIAL/PLATELET
Basophils Absolute: 0 10*3/uL (ref 0.0–0.1)
Basophils Relative: 0 %
EOS PCT: 1 %
Eosinophils Absolute: 0.1 10*3/uL (ref 0.0–0.7)
HCT: 26.5 % — ABNORMAL LOW (ref 36.0–46.0)
Hemoglobin: 7.6 g/dL — ABNORMAL LOW (ref 12.0–15.0)
LYMPHS ABS: 1.9 10*3/uL (ref 0.7–4.0)
LYMPHS PCT: 14 %
MCH: 21.8 pg — AB (ref 26.0–34.0)
MCHC: 28.7 g/dL — AB (ref 30.0–36.0)
MCV: 76.1 fL — AB (ref 78.0–100.0)
MONO ABS: 1.3 10*3/uL — AB (ref 0.1–1.0)
MONOS PCT: 9 %
Neutro Abs: 10.5 10*3/uL — ABNORMAL HIGH (ref 1.7–7.7)
Neutrophils Relative %: 76 %
PLATELETS: 811 10*3/uL — AB (ref 150–400)
RBC: 3.48 MIL/uL — AB (ref 3.87–5.11)
RDW: 21 % — AB (ref 11.5–15.5)
WBC: 13.9 10*3/uL — ABNORMAL HIGH (ref 4.0–10.5)

## 2015-02-27 LAB — COMPREHENSIVE METABOLIC PANEL
ALT: 13 U/L — AB (ref 14–54)
AST: 14 U/L — ABNORMAL LOW (ref 15–41)
Albumin: 2.5 g/dL — ABNORMAL LOW (ref 3.5–5.0)
Alkaline Phosphatase: 85 U/L (ref 38–126)
Anion gap: 11 (ref 5–15)
BUN: 14 mg/dL (ref 6–20)
CALCIUM: 9.3 mg/dL (ref 8.9–10.3)
CHLORIDE: 103 mmol/L (ref 101–111)
CO2: 22 mmol/L (ref 22–32)
CREATININE: 1.84 mg/dL — AB (ref 0.44–1.00)
GFR, EST AFRICAN AMERICAN: 38 mL/min — AB (ref >60)
GFR, EST NON AFRICAN AMERICAN: 33 mL/min — AB (ref >60)
Glucose, Bld: 107 mg/dL — ABNORMAL HIGH (ref 65–99)
Potassium: 3.3 mmol/L — ABNORMAL LOW (ref 3.5–5.1)
Sodium: 136 mmol/L (ref 135–145)
Total Bilirubin: 0.1 mg/dL — ABNORMAL LOW (ref 0.3–1.2)
Total Protein: 8.1 g/dL (ref 6.5–8.1)

## 2015-02-27 LAB — URINE MICROSCOPIC-ADD ON

## 2015-02-27 LAB — I-STAT CG4 LACTIC ACID, ED
LACTIC ACID, VENOUS: 0.57 mmol/L (ref 0.5–2.0)
LACTIC ACID, VENOUS: 0.92 mmol/L (ref 0.5–2.0)
LACTIC ACID, VENOUS: 1.1 mmol/L (ref 0.5–2.0)

## 2015-02-27 MED ORDER — PIPERACILLIN-TAZOBACTAM 3.375 G IVPB
INTRAVENOUS | Status: AC
  Filled 2015-02-27: qty 50

## 2015-02-27 MED ORDER — MORPHINE SULFATE (PF) 4 MG/ML IV SOLN
INTRAVENOUS | Status: AC
  Filled 2015-02-27: qty 1

## 2015-02-27 MED ORDER — ONDANSETRON HCL 4 MG/2ML IJ SOLN
INTRAMUSCULAR | Status: AC
  Filled 2015-02-27: qty 2

## 2015-02-27 MED ORDER — HYDROMORPHONE HCL 1 MG/ML IJ SOLN
1.0000 mg | Freq: Once | INTRAMUSCULAR | Status: AC
  Administered 2015-02-27: 1 mg via INTRAVENOUS
  Filled 2015-02-27: qty 1

## 2015-02-27 MED ORDER — HYDROMORPHONE HCL 2 MG/ML IJ SOLN
INTRAMUSCULAR | Status: AC
  Filled 2015-02-27: qty 1

## 2015-02-27 MED ORDER — PIPERACILLIN-TAZOBACTAM 3.375 G IVPB
3.3750 g | Freq: Three times a day (TID) | INTRAVENOUS | Status: DC
  Administered 2015-02-27: 3.375 g via INTRAVENOUS
  Filled 2015-02-27 (×2): qty 50

## 2015-02-27 MED ORDER — SODIUM CHLORIDE 0.9 % IV BOLUS (SEPSIS)
1000.0000 mL | Freq: Once | INTRAVENOUS | Status: AC
  Administered 2015-02-27: 1000 mL via INTRAVENOUS

## 2015-02-27 MED ORDER — PIPERACILLIN-TAZOBACTAM 3.375 G IVPB 30 MIN
3.3750 g | Freq: Once | INTRAVENOUS | Status: AC
  Administered 2015-02-27: 3.375 g via INTRAVENOUS

## 2015-02-27 MED ORDER — HYDROMORPHONE HCL 2 MG/ML IJ SOLN
2.0000 mg | Freq: Once | INTRAMUSCULAR | Status: AC
  Administered 2015-02-27: 2 mg via INTRAVENOUS

## 2015-02-27 MED ORDER — SODIUM CHLORIDE 0.9 % IV SOLN
INTRAVENOUS | Status: DC
  Administered 2015-02-27: 14:00:00 via INTRAVENOUS

## 2015-02-27 MED ORDER — HYDROMORPHONE HCL 1 MG/ML IJ SOLN
1.0000 mg | Freq: Once | INTRAMUSCULAR | Status: AC
  Administered 2015-02-27: 1 mg via INTRAVENOUS

## 2015-02-27 MED ORDER — HYDROMORPHONE HCL 1 MG/ML IJ SOLN
INTRAMUSCULAR | Status: AC
  Filled 2015-02-27: qty 1

## 2015-02-27 NOTE — Progress Notes (Signed)
ANTIBIOTIC CONSULT NOTE - INITIAL  Pharmacy Consult for Zosyn Indication: intra-abdominal infection  Allergies  Allergen Reactions  . Compazine Anaphylaxis  . Latex Rash    Patient Measurements: Weight: 121 lb 4.1 oz (55 kg) Adjusted Body Weight:   Vital Signs: Temp: 99.2 F (37.3 C) (11/16 0054) Temp Source: Oral (11/16 0054) BP: 101/50 mmHg (11/16 0416) Pulse Rate: 85 (11/16 0416) Intake/Output from previous day:   Intake/Output from this shift:    Labs:  Recent Labs  02/27/15 0015  WBC 13.9*  HGB 7.6*  PLT 811*  CREATININE 1.84*   Estimated Creatinine Clearance: 31.5 mL/min (by C-G formula based on Cr of 1.84). No results for input(s): VANCOTROUGH, VANCOPEAK, VANCORANDOM, GENTTROUGH, GENTPEAK, GENTRANDOM, TOBRATROUGH, TOBRAPEAK, TOBRARND, AMIKACINPEAK, AMIKACINTROU, AMIKACIN in the last 72 hours.   Microbiology: Recent Results (from the past 720 hour(s))  Urine culture     Status: None   Collection Time: 02/24/15 12:10 AM  Result Value Ref Range Status   Specimen Description URINE, CATHETERIZED  Final   Special Requests NONE  Final   Culture   Final    NO GROWTH 1 DAY Performed at [facility]    Report Status 02/25/2015 FINAL  Final    Medical History: Past Medical History  Diagnosis Date  . 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 Kidney infection   . History of creation of ostomy     Medications:  Anti-infectives    Start     Dose/Rate Route Frequency Ordered Stop   02/27/15 1400  piperacillin-tazobactam (ZOSYN) IVPB 3.375 g     3.375 g 12.5 mL/hr over 240 Minutes Intravenous 3 times per day 02/27/15 0511     02/27/15 0535  piperacillin-tazobactam (ZOSYN) 3.375 GM/50ML IVPB    Comments:  Ebony Hail   : cabinet override      02/27/15 0535 02/27/15 1744   02/27/15 0515  piperacillin-tazobactam (ZOSYN) IVPB 3.375 g     3.375 g 100 mL/hr over 30 Minutes Intravenous  Once 02/27/15  0507       Assessment: Patient with intra-abdominal infection.  First dose of antibiotics already given in ED.  Goal of Therapy:  Zosyn based on renal function Appropriate antibiotic dosing for renal function; eradication of infection   Plan:  Follow up culture results  Zosyn 3.375g IV Q8H infused over 4hrs.   Tyler Deis, Shea Stakes [place]ford 02/27/2015,5:59 AM

## 2015-02-27 NOTE — ED Notes (Signed)
Patient transported to CT 

## 2015-02-27 NOTE — ED Notes (Signed)
Spoke to [employer] about the delay with the patient getting a bed and was told that a room should be open shortly, I also explained to her that the patient had been waiting  For a bed all day .

## 2015-02-27 NOTE — ED Provider Notes (Signed)
  Physical Exam  BP 108/51 mmHg  Pulse 76  Temp(Src) 99 F (37.2 C) (Oral)  Resp 17  Wt 121 lb 4.1 oz (55 kg)  SpO2 100%  Physical Exam  ED Course  Procedures  MDM Patient now has available bed at [facility]. Patient reevaluated. Transfer put in.    Davonna Belling, MD 02/27/15 [phone]

## 2015-02-27 NOTE — ED Notes (Signed)
Report received from Leslie, RN.

## 2015-02-27 NOTE — ED Notes (Signed)
Pollina, MD at bedside 

## 2015-02-27 NOTE — ED Notes (Signed)
[facility] states they should have ambulance here for transport in 1 hour.

## 2015-02-27 NOTE — ED Notes (Signed)
Pt c/o severe back pain and requesting to speak with the doctor. Betsey Holiday, MD notified.

## 2015-02-27 NOTE — ED Notes (Signed)
Pt c/o pain and wanting to see the doctor. Betsey Holiday, MD notified.

## 2015-02-27 NOTE — ED Notes (Signed)
Pt requesting pain medication and states pain is out of control. Betsey Holiday, MD notified.

## 2015-02-27 NOTE — ED Provider Notes (Signed)
Patient updated that still awaiting bed placement at [facility]. After calling to check on bed assignment, I was rerouted to discuss the case again with the accepting service. By the note of the previous physician, Dr. Wyvonna Plum (with whom Dr. Waverly Ferrari had planned the transfer arrangements), the oncoming physician with whom I spoke, Dr. Ree Kida, believed that the patient had not definitively been accepted for transfer. By the understanding of Dr. Ree Kida and the transfer center, the case had been discussed in consultation but not accepted for transfer due to them not having a bed available at the time. After reviewing the case and discussing the plan, Dr.Polsani excess patient for transfer and Mina Marble will call back with a bed assignment when available. They do continue to have full bed status.  The patient reports that she is feeling improved. She reports her pain although still present, is much better than when she began treatment. She has been up and ambulatory to the bathroom without difficulty. The patient is taking ice water. She has nontoxic alert appearance. She has been getting antibiotics and fluid without deteriorating condition. Plan is still for transfer based on CT findings and potential risk of complications of complex underlying urologic condition with hydro-nephrosis and possible increase in fluid collection\abscess relative to prior CT scans.  Charlesetta Shanks, MD 02/27/15 [phone]

## 2015-02-27 NOTE — ED Notes (Signed)
Pt c/o increasing pain 9/10. Betsey Holiday, MD notified.

## 2015-02-28 LAB — URINE CULTURE

## 2015-03-04 LAB — CULTURE, BLOOD (ROUTINE X 2)
CULTURE: NO GROWTH
CULTURE: NO GROWTH

## 2015-03-07 ENCOUNTER — Emergency Department (HOSPITAL_BASED_OUTPATIENT_CLINIC_OR_DEPARTMENT_OTHER)
Admission: EM | Admit: 2015-03-07 | Discharge: 2015-03-07 | Disposition: A | Discharge status: Home / Self Care | Payer: Medicare Other | Attending: Emergency Medicine | Admitting: Emergency Medicine

## 2015-03-07 ENCOUNTER — Encounter (HOSPITAL_BASED_OUTPATIENT_CLINIC_OR_DEPARTMENT_OTHER): Payer: Self-pay

## 2015-03-07 DIAGNOSIS — Z87448 Personal history of other diseases of urinary system: Secondary | ICD-10-CM | POA: Diagnosis not present

## 2015-03-07 DIAGNOSIS — Z85048 Personal history of other malignant neoplasm of rectum, rectosigmoid junction, and anus: Secondary | ICD-10-CM | POA: Diagnosis not present

## 2015-03-07 DIAGNOSIS — Z859 Personal history of malignant neoplasm, unspecified: Secondary | ICD-10-CM | POA: Insufficient documentation

## 2015-03-07 DIAGNOSIS — Z79899 Other long term (current) drug therapy: Secondary | ICD-10-CM | POA: Diagnosis not present

## 2015-03-07 DIAGNOSIS — Z452 Encounter for adjustment and management of vascular access device: Secondary | ICD-10-CM | POA: Insufficient documentation

## 2015-03-07 MED ORDER — ONDANSETRON HCL 4 MG/2ML IJ SOLN: 4.0000 mg | Freq: Once | INTRAMUSCULAR | Status: DC

## 2015-03-07 MED ORDER — PIPERACILLIN-TAZOBACTAM 3.375 G IVPB 30 MIN
3.3750 g | Freq: Once | INTRAVENOUS | Status: DC
  Filled 2015-03-07: qty 50

## 2015-03-07 MED ORDER — HYDROMORPHONE HCL 1 MG/ML IJ SOLN: 1.0000 mg | Freq: Once | INTRAMUSCULAR | Status: DC

## 2015-03-07 NOTE — ED Notes (Signed)
Pt concerned about length of time awaiting a bed assignment at [facility] does not want to stay in ED "all day".  Explained to pt that we were told to expect an update from [facility] room assignment team by 0800 today.  Pt expressed desire to go home and "take care of things there" before going to [facility] herself later today.  Pt decided to stay in this ED awaiting update from [facility] and transport team.

## 2015-03-07 NOTE — ED Notes (Signed)
Pt states home health rn was unable to access porta cath has not had antibiotics and pain meds since Tuesday pm

## 2015-03-07 NOTE — ED Notes (Signed)
[facility] team for an update on room assignment for patient.  No bed available at [facility] at this time.  Pt made aware.

## 2015-03-07 NOTE — ED Notes (Signed)
Peripheral IV inserted by MD will not flush.  Removed per MD verbal order.  Pt portacath access attempted by previous RN Mortimer Fries) x2 without success.  No IV access established at this time.  Pt updated on plan of care to be admitted to [facility] for insertion of new Portacath for vascular access and medication administration.

## 2015-03-07 NOTE — ED Notes (Signed)
Pt states d/c from [facility] 11/22 after admitted for a week; had stents placed in rt kidney and then a nephrostomy 3 days ago; states unable to get her Rx's filled, homehealth RN told her to come to the ER to have her portacath access d/t many attempts and no success; pt states has not had her meds today

## 2015-03-07 NOTE — ED Notes (Signed)
Pt wants to leave AMA.  Risks of leaving AMA explained and discussed with patient.  Pt verbalized understanding of risks and option to return to ED at any time.

## 2015-03-07 NOTE — ED Provider Notes (Signed)
After extensive attempts by Dr. Leonides Schanz to successfully arrange for inpatient transfer to [facility] for definitive care, the patient has decided to leave [place]. I spoke briefly with her. She expresses understanding that her condition may worsen. This could result in worsening condition leading to permanent disability or death. She expresses understanding of these risks and still wishes to proceed with leaving against advice of her health care provider today. She is in a sound mind and appropriate to make this decision on her own.behalf.  Tanna Furry, MD 03/07/15 [phone]

## 2015-03-07 NOTE — Discharge Instructions (Signed)
You are leaving against the advice of Dr. Leonides Schanz.  Dr. Leonides Schanz had arranged for you to be transferred to [facility] for further care.  By leaving, you absolve Dr. Leonides Schanz of any responsibility for your health condition.

## 2015-03-07 NOTE — ED Provider Notes (Signed)
TIME SEEN: 5:30 AM  CHIEF COMPLAINT: Unable to access Port-A-Cath, "I feel like I'm deteriorating"  HPI: Pt is a 44 y.o. 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  She was recently admitted to [facility] for urosepsis. She had a left double-J ureteral stent replaced by urology and a right ureteral stent removed with a right nephrostomy tube placed. She was also transfused 1 unit of packed red blood cells due to anemia. Was discharged from the hospital on November 22 on Zosyn 3.375 g IV every 8 hours via a Port-A-Cath. She had his Port-A-Cath placed in 2011. She states that during this hospitalization and they had difficulty accessing the port. It was flushed prior to discharge and prior to being de-accessed. She states that her home health nurse Nira Conn with [facility] (cell [phone]) was unable to access her port several times. I confirmed this with this nurse who states that she felt the port was movable. Another nurse with home health also tried access the patient's poor but was unable to. Nira Conn states that she called a pharmacist at [facility] to stated the patient should come back to [facility] to have her port access or another option for long-term IV placement. Instead patient came here to [facility]. She states that she is not feeling well but is unable to give me any specifics. There is no fever. No vomiting the patient does have chronic nausea. She has chronic pain and states she has not been able to have her IV pain medication either.  Last given Zosyn 11pm 11/22 per her report.  Report she is having normal output from her ostomy, nephrostomy. Urinating normally.  ROS: See HPI Constitutional: no fever  Eyes: no drainage  ENT: no runny nose   Cardiovascular:  no chest pain  Resp: no SOB  GI: no  vomiting GU: no dysuria Integumentary: no rash  Allergy: no hives  Musculoskeletal: no leg swelling  Neurological: no slurred speech ROS otherwise negative  PAST MEDICAL HISTORY/PAST SURGICAL HISTORY:  Past Medical History  Diagnosis Date  . Colorectal cancer ([place])     colo-rectal ca   . 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 Kidney infection   . History of creation of ostomy     MEDICATIONS:  Prior to Admission medications   Medication Sig Start Date End Date Taking? Authorizing Provider  calcium carbonate (TUMS - DOSED IN MG ELEMENTAL CALCIUM) 500 MG chewable tablet Chew 1 tablet by mouth daily.    Historical Provider, MD  cephALEXin (KEFLEX) 250 MG/5ML suspension Take 10 mLs (500 mg total) by mouth 3 (three) times daily. Patient not taking: Reported on 02/26/2015 02/24/15   [name], MD  fentaNYL (DURAGESIC - DOSED MCG/HR) 25 MCG/HR Place 25 mcg onto the skin every 3 (three) days.     Historical Provider, MD  fluconazole (DIFLUCAN) 150 MG tablet Take 1 tablet as needed for vaginal yeast infection. May repeat in 3 days if necessary. Patient not taking: Reported on 02/26/2015 02/24/15   [name], MD  gabapentin (NEURONTIN) 300 MG capsule Take 300 mg by mouth 3 (three) times daily. 07/13/13   Historical Provider, MD  oxyCODONE-acetaminophen (PERCOCET) 10-325 MG tablet Take 1 tablet by mouth every 4 (four) hours as needed for pain.    Historical Provider, MD  simethicone (MYLICON) 0000000 MG chewable tablet Chew 125 mg by  mouth every 6 (six) hours as needed for flatulence.    Historical Provider, MD  tamsulosin (FLOMAX) 0.4 MG CAPS capsule Take 0.4 mg by mouth daily as needed (soreness).    Historical Provider, MD  zolpidem (AMBIEN) 10 MG tablet Take 1 tablet (10 mg total) by mouth at bedtime as needed for sleep. 12/22/12 01/21/13  Wyatt Portela, MD  zolpidem (AMBIEN) 10 MG tablet Take 1 tablet (10 mg total) by mouth at bedtime as needed for sleep. Patient not taking: Reported  on 02/26/2015 07/27/13   Wyatt Portela, MD    ALLERGIES:  Allergies  Allergen Reactions  . Compazine Anaphylaxis  . Latex Rash    SOCIAL HISTORY:  Social History  Substance Use Topics  . Smoking status: Never Smoker   . Smokeless tobacco: Not on file  . Alcohol Use: No    FAMILY HISTORY: No family history on file.  EXAM: BP 114/73 mmHg  Pulse 68  Temp(Src) 98.3 F (36.8 C) (Oral)  Resp 20  SpO2 100% CONSTITUTIONAL: Alert and oriented and responds appropriately to questions. Chronically ill-appearing, in no distress HEAD: Normocephalic EYES: Conjunctivae clear, PERRL ENT: normal nose; no rhinorrhea; moist mucous membranes; pharynx without lesions noted NECK: Supple, no meningismus, no LAD  CARD: RRR; S1 and S2 appreciated; no murmurs, no clicks, no rubs, no gallops CHEST:  Port-A-Cath noted in the right chest wall there is tender palpation without overlying erythema, warmth, fluctuance or induration, no subcutaneous air RESP: Normal chest excursion without splinting or tachypnea; breath sounds clear and equal bilaterally; no wheezes, no rhonchi, no rales, no hypoxia or respiratory distress, speaking full sentences ABD/GI: Normal bowel sounds; non-distended; soft, non-tender, no rebound, no guarding, no peritoneal signs; ostomy in place with good output BACK:  The back appears normal, there is no CVA tenderness; nephrostomy tube noted in the right lower back with no swelling erythema, warmth or drainage, she is tender around this area EXT: Normal ROM in all joints; non-tender to palpation; no edema; normal capillary refill; no cyanosis, no calf tenderness or swelling    SKIN: Normal color for age and race; warm NEURO: Moves all extremities equally, sensation to light touch intact diffusely, cranial nerves II through XII intact PSYCH: The patient's mood and manner are appropriate. Grooming and personal hygiene are appropriate.  MEDICAL DECISION MAKING: Patient here requesting  that we access her Port-A-Cath and sent her home with an accessed Port-A-Cath so she can receive IV antibiotics. Discussed with patient that this is against nursing policy. Discussed that we can access her Port-A-Cath and give her dose of IV antibiotics but we cannot send her home with her Port-A-Cath being access. I have discussed patient's care with Nira Conn who is her home health nurse. Nira Conn states that she feels uncomfortable attempting to access the Port-A-Cath again and they wanted her to be sent to [place] to have other IV access placed or to have her Port-A-Cath accessed and discharged home with this access. We have attempted to access her Port-A-Cath in the emergency department here and were unsuccessful. Nursing staff feels that the Port-A-Cath is not anchored correctly and is movable. Given patient has had all of her care which has been very complex at [facility] will talk to urologist and hospitalist at [facility] for recommendations.  ED PROGRESS: 6:30 AM  Discussed with Dr. Sampson Goon who is on-call for urology at [facility]. She agrees that patient needs to be transferred back to [facility] for  further management given her extremely complex case. She recommends talking to the hospitalist service to see if patient can be directly admitted.  They will see pt in consult.   6:50 AM  Discussed with Dr. Genene Churn with hospitalist service at [facility] who agrees to except patient in transfer. Transfer center states they do have a bed available within the next hour. Patient is stable. We are attempting to obtain peripheral IV access and basic labs.   7:15 AM  Obtained PIV access which pt is now asking we remove because it is hurting her.  No infiltration or hematoma.  Flushes easily but will not give blood.  Patient refuses to allow anyone else to place peripheral IV. Given she is chronically ill and not acutely ill and feel she can be transferred to [facility] and  that they can obtain access and labs.    Angiocath insertion Performed by: Nyra Jabs  Consent: Verbal consent obtained. Risks and benefits: risks, benefits and alternatives were discussed Time out: Immediately prior to procedure a "time out" was called to verify the correct patient, procedure, equipment, support staff and site/side marked as required.  Preparation: Patient was prepped and draped in the usual sterile fashion.  Vein Location: Right AC  Ultrasound Guided  Gauge: 22  Normal blood return and flush without difficulty Patient tolerance: Patient tolerated the procedure well with no immediate complications.     [place], DO 03/07/15 [phone]

## 2015-03-07 NOTE — ED Notes (Signed)
rn attempted to flush iv,  After about 0.5 cc ns pt stated to stop,  Will not attempt to infuse anything into iv,  No swelling  Noted at site

## 2015-05-10 ENCOUNTER — Encounter (HOSPITAL_COMMUNITY): Payer: Self-pay | Admitting: Emergency Medicine

## 2015-05-10 ENCOUNTER — Emergency Department (HOSPITAL_COMMUNITY): Payer: Medicare Other

## 2015-05-10 ENCOUNTER — Emergency Department (HOSPITAL_COMMUNITY)
Admission: EM | Admit: 2015-05-10 | Discharge: 2015-05-11 | Disposition: A | Discharge status: Other Acute Inpatient Hospital | Payer: Medicare Other | Attending: Emergency Medicine | Admitting: Emergency Medicine

## 2015-05-10 DIAGNOSIS — R000 Tachycardia, unspecified: Secondary | ICD-10-CM | POA: Diagnosis not present

## 2015-05-10 DIAGNOSIS — E872 Acidosis, unspecified: Secondary | ICD-10-CM

## 2015-05-10 DIAGNOSIS — Z79899 Other long term (current) drug therapy: Secondary | ICD-10-CM | POA: Diagnosis not present

## 2015-05-10 DIAGNOSIS — Z85038 Personal history of other malignant neoplasm of large intestine: Secondary | ICD-10-CM | POA: Insufficient documentation

## 2015-05-10 DIAGNOSIS — Z91040 Latex allergy status: Secondary | ICD-10-CM | POA: Diagnosis not present

## 2015-05-10 DIAGNOSIS — N179 Acute kidney failure, unspecified: Secondary | ICD-10-CM | POA: Diagnosis not present

## 2015-05-10 DIAGNOSIS — Z933 Colostomy status: Secondary | ICD-10-CM | POA: Diagnosis not present

## 2015-05-10 DIAGNOSIS — N739 Female pelvic inflammatory disease, unspecified: Secondary | ICD-10-CM | POA: Diagnosis not present

## 2015-05-10 DIAGNOSIS — A419 Sepsis, unspecified organism: Secondary | ICD-10-CM | POA: Diagnosis not present

## 2015-05-10 DIAGNOSIS — N390 Urinary tract infection, site not specified: Secondary | ICD-10-CM

## 2015-05-10 DIAGNOSIS — R4182 Altered mental status, unspecified: Secondary | ICD-10-CM | POA: Diagnosis present

## 2015-05-10 DIAGNOSIS — E875 Hyperkalemia: Secondary | ICD-10-CM | POA: Diagnosis not present

## 2015-05-10 LAB — AMMONIA: Ammonia: 28 umol/L (ref 9–35)

## 2015-05-10 LAB — I-STAT CG4 LACTIC ACID, ED
LACTIC ACID, VENOUS: 8.69 mmol/L — AB (ref 0.5–2.0)
Lactic Acid, Venous: 3.26 mmol/L (ref 0.5–2.0)

## 2015-05-10 LAB — URINE MICROSCOPIC-ADD ON

## 2015-05-10 LAB — BLOOD GAS, VENOUS
ACID-BASE DEFICIT: 13.9 mmol/L — AB (ref 0.0–2.0)
BICARBONATE: 13.6 meq/L — AB (ref 20.0–24.0)
O2 SAT: 65.6 %
PH VEN: 7.173 — AB (ref 7.250–7.300)
PO2 VEN: 47.2 mmHg — AB (ref 30.0–45.0)
Patient temperature: 98.6
TCO2: 13.2 mmol/L (ref 0–100)
pCO2, Ven: 38.6 mmHg — ABNORMAL LOW (ref 45.0–50.0)

## 2015-05-10 LAB — CBC
HEMATOCRIT: 38.5 % (ref 36.0–46.0)
Hemoglobin: 11.7 g/dL — ABNORMAL LOW (ref 12.0–15.0)
MCH: 26 pg (ref 26.0–34.0)
MCHC: 30.4 g/dL (ref 30.0–36.0)
MCV: 85.6 fL (ref 78.0–100.0)
Platelets: 1264 10*3/uL (ref 150–400)
RBC: 4.5 MIL/uL (ref 3.87–5.11)
RDW: 19.4 % — AB (ref 11.5–15.5)
WBC: 46.9 10*3/uL — ABNORMAL HIGH (ref 4.0–10.5)

## 2015-05-10 LAB — URINALYSIS, ROUTINE W REFLEX MICROSCOPIC
Bilirubin Urine: NEGATIVE
Glucose, UA: NEGATIVE mg/dL
Ketones, ur: NEGATIVE mg/dL
Nitrite: NEGATIVE
PH: 5.5 (ref 5.0–8.0)
Protein, ur: 30 mg/dL — AB
SPECIFIC GRAVITY, URINE: 1.018 (ref 1.005–1.030)

## 2015-05-10 LAB — I-STAT TROPONIN, ED: TROPONIN I, POC: 0.01 ng/mL (ref 0.00–0.08)

## 2015-05-10 LAB — I-STAT CHEM 8, ED
BUN: 122 mg/dL — AB (ref 6–20)
CALCIUM ION: 1.1 mmol/L — AB (ref 1.12–1.23)
CHLORIDE: 110 mmol/L (ref 101–111)
Creatinine, Ser: 4.3 mg/dL — ABNORMAL HIGH (ref 0.44–1.00)
Glucose, Bld: 162 mg/dL — ABNORMAL HIGH (ref 65–99)
HCT: 34 % — ABNORMAL LOW (ref 36.0–46.0)
Hemoglobin: 11.6 g/dL — ABNORMAL LOW (ref 12.0–15.0)
POTASSIUM: 5.6 mmol/L — AB (ref 3.5–5.1)
SODIUM: 138 mmol/L (ref 135–145)
TCO2: 17 mmol/L (ref 0–100)

## 2015-05-10 LAB — PROTIME-INR
INR: 1.79 — AB (ref 0.00–1.49)
Prothrombin Time: 20.1 seconds — ABNORMAL HIGH (ref 11.6–15.2)

## 2015-05-10 LAB — CBG MONITORING, ED: Glucose-Capillary: 173 mg/dL — ABNORMAL HIGH (ref 65–99)

## 2015-05-10 LAB — LIPASE, BLOOD: Lipase: 36 U/L (ref 11–51)

## 2015-05-10 MED ORDER — SODIUM CHLORIDE 0.9 % IV SOLN
Freq: Once | INTRAVENOUS | Status: AC
  Administered 2015-05-10: via INTRAVENOUS

## 2015-05-10 MED ORDER — ALTEPLASE 2 MG IJ SOLR
2.0000 mg | Freq: Once | INTRAMUSCULAR | Status: AC
  Administered 2015-05-10: 2 mg
  Filled 2015-05-10: qty 2

## 2015-05-10 MED ORDER — SODIUM CHLORIDE 0.9 % IV BOLUS (SEPSIS)
1000.0000 mL | INTRAVENOUS | Status: AC
  Administered 2015-05-10 (×2): 1000 mL via INTRAVENOUS

## 2015-05-10 MED ORDER — PIPERACILLIN-TAZOBACTAM 3.375 G IVPB 30 MIN
3.3750 g | Freq: Once | INTRAVENOUS | Status: AC
  Administered 2015-05-10: 3.375 g via INTRAVENOUS
  Filled 2015-05-10: qty 50

## 2015-05-10 MED ORDER — VANCOMYCIN HCL IN DEXTROSE 1-5 GM/200ML-% IV SOLN
1000.0000 mg | Freq: Once | INTRAVENOUS | Status: AC
  Administered 2015-05-10: 1000 mg via INTRAVENOUS
  Filled 2015-05-10: qty 200

## 2015-05-10 MED ORDER — LORAZEPAM 2 MG/ML IJ SOLN
1.0000 mg | Freq: Once | INTRAMUSCULAR | Status: AC
  Administered 2015-05-10: 1 mg via INTRAVENOUS
  Filled 2015-05-10: qty 1

## 2015-05-10 MED ORDER — STERILE WATER FOR INJECTION IJ SOLN
INTRAMUSCULAR | Status: AC
  Administered 2015-05-11: 01:00:00
  Filled 2015-05-10: qty 10

## 2015-05-10 MED ORDER — SODIUM CHLORIDE 0.9% FLUSH
10.0000 mL | INTRAVENOUS | Status: DC | PRN
  Administered 2015-05-11: 30 mL
  Filled 2015-05-10: qty 40

## 2015-05-10 NOTE — ED Provider Notes (Signed)
8:09 PM Lactic acid significantly elevated. Dr Billy Fischer currently placing central line in another critically ill patient.  Pt breifly assessed by myself. Has two PIVs, although only able to obtain 22g. Empiric abx already ordered. Pt appears ill. Dry mucus membranes. Encephalopathic. Sinus tach in 130-140s. Normotensive though. Advised nursing to bolus a 2nd liter which would make >30cc/kg.   Virgel Manifold, MD 05/10/15 2014

## 2015-05-10 NOTE — ED Notes (Signed)
PT STATES SHE WANTS HER PORT ACCESSED FOR RECOLLECT OF LABS NOTIFIED RN

## 2015-05-10 NOTE — ED Notes (Signed)
Per EMS pt. Is from home who was reported to have altered mental status x3 weeks, pt. Has colon CA, pt. Also reported of pulling out her colostomy bag. Pt.'s friends called EMS at 6pm upon visiting pt.'a place and reported that pt. Had been lying on her couch for about 3 weeks now with colostomy bag pulled. Pt. Has a son who reported to EMS and friends that his mom told him " not to tell anybody of her condition."

## 2015-05-10 NOTE — ED Notes (Signed)
[place], EDP made aware of patient I-stat lactic result.

## 2015-05-10 NOTE — Progress Notes (Signed)
ANTIBIOTIC CONSULT NOTE - INITIAL  Pharmacy Consult for Vancomycin & Zosyn Indication: rule out sepsis  Allergies  Allergen Reactions  . Compazine Anaphylaxis  . Latex Rash    Patient Measurements: Weight: 126 lb (57.153 kg)  Vital Signs: Temp: 101.7 F (38.7 C) (01/27 1914) Temp Source: Rectal (01/27 1914) BP: 126/82 mmHg (01/27 1914) Pulse Rate: 150 (01/27 1914) Intake/Output from previous day:   Intake/Output from this shift:    Labs: No results for input(s): WBC, HGB, PLT, LABCREA, CREATININE in the last 72 hours. CrCl cannot be calculated (Patient has no serum creatinine result on file.). No results for input(s): VANCOTROUGH, VANCOPEAK, VANCORANDOM, GENTTROUGH, GENTPEAK, GENTRANDOM, TOBRATROUGH, TOBRAPEAK, TOBRARND, AMIKACINPEAK, AMIKACINTROU, AMIKACIN in the last 72 hours.   Microbiology: No results found for this or any previous visit (from the past 720 hour(s)).  Medical History: Past Medical History  Diagnosis Date  . Colorectal cancer ([place])     colo-rectal ca   . 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 Kidney infection   . History of creation of ostomy     Medications:  Anti-infectives    Start     Dose/Rate Route Frequency Ordered Stop   05/10/15 1945  piperacillin-tazobactam (ZOSYN) IVPB 3.375 g     3.375 g 100 mL/hr over 30 Minutes Intravenous  Once 05/10/15 1939     05/10/15 1945  vancomycin (VANCOCIN) IVPB 1000 mg/200 mL premix     1,000 mg 200 mL/hr over 60 Minutes Intravenous  Once 05/10/15 1939       Assessment: 45 yo F with hx colon CA s/p colostomy who presented with report of altered mental status and self-removal of colostomy bag x 3 weeks.  Code sepsis was called.  Pharmacy asked to dose Vancomycin & Zosyn.    05/10/2015:  Febrile- Tm 101.7 F  WBC elevated (46.9)  Lactic acid elevated (8.69)  Scr- to be collected  Goal of Therapy:  Vancomycin trough level 15-20 mcg/ml  Eradicate infection.  Plan:  Zosyn 3.375gm IV  x1 Vancomycin 1gm IV x1 F/U Scr for scheduled doses  Biagio Borg 05/10/2015,7:46 PM

## 2015-05-10 NOTE — ED Notes (Signed)
MD at bedside. 

## 2015-05-10 NOTE — ED Notes (Signed)
Dr. Wilson Singer and Dr. Billy Fischer both aware of critical WBC of 46.9 as well as platelets of 1264.  Both MD's verbalized acknowledgement.

## 2015-05-11 ENCOUNTER — Emergency Department (HOSPITAL_COMMUNITY): Payer: Medicare Other

## 2015-05-11 LAB — COMPREHENSIVE METABOLIC PANEL
ALT: 18 U/L (ref 14–54)
AST: 56 U/L — ABNORMAL HIGH (ref 15–41)
Albumin: 2.1 g/dL — ABNORMAL LOW (ref 3.5–5.0)
Alkaline Phosphatase: 116 U/L (ref 38–126)
Anion gap: 15 (ref 5–15)
BUN: 131 mg/dL — AB (ref 6–20)
CALCIUM: 8.5 mg/dL — AB (ref 8.9–10.3)
CO2: 16 mmol/L — AB (ref 22–32)
Chloride: 108 mmol/L (ref 101–111)
Creatinine, Ser: 4.18 mg/dL — ABNORMAL HIGH (ref 0.44–1.00)
GFR calc Af Amer: 14 mL/min — ABNORMAL LOW (ref >60)
GFR, EST NON AFRICAN AMERICAN: 12 mL/min — AB (ref >60)
GLUCOSE: 173 mg/dL — AB (ref 65–99)
POTASSIUM: 5.7 mmol/L — AB (ref 3.5–5.1)
Sodium: 139 mmol/L (ref 135–145)
TOTAL PROTEIN: 7.6 g/dL (ref 6.5–8.1)
Total Bilirubin: 0.8 mg/dL (ref 0.3–1.2)

## 2015-05-11 MED ORDER — INSULIN ASPART 100 UNIT/ML ~~LOC~~ SOLN
5.0000 [IU] | Freq: Once | SUBCUTANEOUS | Status: AC
  Administered 2015-05-11: 5 [IU] via INTRAVENOUS
  Filled 2015-05-11: qty 1

## 2015-05-11 MED ORDER — DEXTROSE 50 % IV SOLN
1.0000 | Freq: Once | INTRAVENOUS | Status: AC
  Administered 2015-05-11: 50 mL via INTRAVENOUS
  Filled 2015-05-11: qty 50

## 2015-05-11 MED ORDER — STERILE WATER FOR INJECTION IV SOLN
INTRAVENOUS | Status: DC
  Administered 2015-05-11: 02:00:00 via INTRAVENOUS
  Filled 2015-05-11: qty 850

## 2015-05-11 MED ORDER — VANCOMYCIN HCL IN DEXTROSE 750-5 MG/150ML-% IV SOLN: 750.0000 mg | INTRAVENOUS | Status: DC

## 2015-05-11 MED ORDER — SODIUM BICARBONATE 8.4 % IV SOLN
150.0000 meq | Freq: Once | INTRAVENOUS | Status: DC
  Filled 2015-05-11: qty 50

## 2015-05-11 MED ORDER — PIPERACILLIN-TAZOBACTAM IN DEX 2-0.25 GM/50ML IV SOLN
2.2500 g | Freq: Three times a day (TID) | INTRAVENOUS | Status: DC
  Filled 2015-05-11: qty 50

## 2015-05-11 NOTE — ED Provider Notes (Signed)
CSN: [id number]     Arrival date & time 05/10/15  1902 History   First MD Initiated Contact with Patient 05/10/15 1935     Chief Complaint  Patient presents with  . Altered Mental Status  . cancer pt   . pulled out ostomy      (Consider location/radiation/quality/duration/timing/severity/associated sxs/prior Treatment) HPI Comments: Patient with altered mental status reportedly for 3 weeks. Per report, patient removed her ostomy bag, and has been laying on the couch for the last 3 weeks, and was covered in stool on EMS arrival.  Patient's son reports that patient had told him not to tell anyone about her condition. She's not been eating or drinking. Reportedly she has just been laying on the couch for the last several weeks. She missed her appointment last week for right nephrostomy tube exchange.    On EMS arrival, patient was found to be altered, covered in stool, sores, and malnourished  History is limited. Son not present at time of evaluation  Patient is a 45 y.o. female presenting with altered mental status. The history is provided by the EMS personnel and a friend. The history is limited by the condition of the patient.  Altered Mental Status Presenting symptoms: confusion   Severity:  Severe Duration:  3 weeks Timing:  Unable to specify Progression:  Worsening Chronicity:  New   Past Medical History  Diagnosis Date  . 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 Kidney infection   . History of creation of ostomy    Past Surgical History  Procedure Laterality Date  . Abdominal surgery    . Ostomy take down    . Rectoperitoneal fistula closure    . Renal artery stent     History reviewed. No pertinent family history. Social History  Substance Use Topics  . Smoking status: Never Smoker   . Smokeless tobacco: None  . Alcohol Use: No   OB History    No data available     Review of Systems  Unable to perform ROS:  Mental status change  Psychiatric/Behavioral: Positive for confusion.      Allergies  Compazine and Latex  Home Medications   Prior to Admission medications   Medication Sig Start Date End Date Taking? Authorizing Provider  calcium carbonate (TUMS - DOSED IN MG ELEMENTAL CALCIUM) 500 MG chewable tablet Chew 1 tablet by mouth daily.    Historical Provider, MD  cephALEXin (KEFLEX) 250 MG/5ML suspension Take 10 mLs (500 mg total) by mouth 3 (three) times daily. Patient not taking: Reported on 02/26/2015 02/24/15   [name], MD  fentaNYL (DURAGESIC - DOSED MCG/HR) 25 MCG/HR Place 25 mcg onto the skin every 3 (three) days.     Historical Provider, MD  fluconazole (DIFLUCAN) 150 MG tablet Take 1 tablet as needed for vaginal yeast infection. May repeat in 3 days if necessary. Patient not taking: Reported on 02/26/2015 02/24/15   [name], MD  gabapentin (NEURONTIN) 300 MG capsule Take 300 mg by mouth 3 (three) times daily. 07/13/13   Historical Provider, MD  oxyCODONE-acetaminophen (PERCOCET) 10-325 MG tablet Take 1 tablet by mouth every 4 (four) hours as needed for pain.    Historical Provider, MD  simethicone (MYLICON) 0000000 MG chewable tablet Chew 125 mg by mouth every 6 (six) hours as needed for flatulence.    Historical Provider, MD  tamsulosin (FLOMAX) 0.4 MG CAPS capsule Take 0.4 mg by mouth daily  as needed (soreness).    Historical Provider, MD  zolpidem (AMBIEN) 10 MG tablet Take 1 tablet (10 mg total) by mouth at bedtime as needed for sleep. 12/22/12 01/21/13  Wyatt Portela, MD  zolpidem (AMBIEN) 10 MG tablet Take 1 tablet (10 mg total) by mouth at bedtime as needed for sleep. Patient not taking: Reported on 02/26/2015 07/27/13   Wyatt Portela, MD   BP 123/81 mmHg  Pulse 114  Temp(Src) 101.7 F (38.7 C) (Rectal)  Resp 22  Wt 126 lb (57.153 kg)  SpO2 100% Physical Exam  Constitutional: She is oriented to person, place, and time. She appears cachectic. She appears toxic. She has  a sickly appearance. She appears ill. No distress.  Agitated, pulling at IVs, confused, looking around room, beginning sentences then trailing off, off topic  HENT:  Head: Normocephalic and atraumatic.  Mouth/Throat: Mucous membranes are dry.  Very dry mucous membranes Teeth covered with brown material   Eyes: Conjunctivae and EOM are normal. Right conjunctiva has no hemorrhage. Left conjunctiva has no hemorrhage. Right eye exhibits normal extraocular motion.  Neck: Normal range of motion.  Cardiovascular: Regular rhythm, normal heart sounds and intact distal pulses.  Tachycardia present.  Exam reveals no gallop and no friction rub.   No murmur heard. Pulmonary/Chest: Effort normal and breath sounds normal. No respiratory distress. She has no wheezes. She has no rales.  Abdominal: Soft. She exhibits no distension. There is generalized tenderness. There is guarding (diffuse). There is no rebound.  Ostomy in place Right nephrostomy tube minimal fluid in bag, urine leaking around tube   Musculoskeletal: She exhibits no edema or tenderness.  Neurological: She is oriented to person, place, and time. GCS eye subscore is 4. GCS verbal subscore is 3. GCS motor subscore is 6.  States name, in hospital, month/year, unable to answer questions, disordered response, states wants IV out repeatedly  Skin: Skin is warm and dry. Abrasion (early skin ulceration right hip/bed sore, linear scratches surrounding area right hip) noted. No rash noted. She is not diaphoretic. No erythema.  Nursing note and vitals reviewed.   ED Course  Procedures (including critical care time) Labs Review Labs Reviewed  CBC - Abnormal; Notable for the following:    WBC 46.9 (*)    Hemoglobin 11.7 (*)    RDW 19.4 (*)    Platelets 1264 (*)    All other components within normal limits  URINALYSIS, ROUTINE W REFLEX MICROSCOPIC (NOT AT [facility]) - Abnormal; Notable for the following:    APPearance TURBID (*)    Hgb urine dipstick  MODERATE (*)    Protein, ur 30 (*)    Leukocytes, UA LARGE (*)    All other components within normal limits  PROTIME-INR - Abnormal; Notable for the following:    Prothrombin Time 20.1 (*)    INR 1.79 (*)    All other components within normal limits  BLOOD GAS, VENOUS - Abnormal; Notable for the following:    pH, Ven 7.173 (*)    pCO2, Ven 38.6 (*)    pO2, Ven 47.2 (*)    Bicarbonate 13.6 (*)    Acid-base deficit 13.9 (*)    All other components within normal limits  URINE MICROSCOPIC-ADD ON - Abnormal; Notable for the following:    Squamous Epithelial / LPF 0-5 (*)    Bacteria, UA MANY (*)    All other components within normal limits  COMPREHENSIVE METABOLIC PANEL - Abnormal; Notable for the following:  Potassium 5.7 (*)    CO2 16 (*)    Glucose, Bld 173 (*)    BUN 131 (*)    Creatinine, Ser 4.18 (*)    Calcium 8.5 (*)    Albumin 2.1 (*)    AST 56 (*)    GFR calc non Af Amer 12 (*)    GFR calc Af Amer 14 (*)    All other components within normal limits  CBG MONITORING, ED - Abnormal; Notable for the following:    Glucose-Capillary 173 (*)    All other components within normal limits  I-STAT CG4 LACTIC ACID, ED - Abnormal; Notable for the following:    Lactic Acid, Venous 8.69 (*)    All other components within normal limits  I-STAT CG4 LACTIC ACID, ED - Abnormal; Notable for the following:    Lactic Acid, Venous 3.26 (*)    All other components within normal limits  I-STAT CHEM 8, ED - Abnormal; Notable for the following:    Potassium 5.6 (*)    BUN 122 (*)    Creatinine, Ser 4.30 (*)    Glucose, Bld 162 (*)    Calcium, Ion 1.10 (*)    Hemoglobin 11.6 (*)    HCT 34.0 (*)    All other components within normal limits  CULTURE, BLOOD (ROUTINE X 2)  CULTURE, BLOOD (ROUTINE X 2)  URINE CULTURE  LIPASE, BLOOD  AMMONIA  PATHOLOGIST SMEAR REVIEW  I-STAT TROPOININ, ED    Imaging Review Ct Abdomen Pelvis Wo Contrast  05/11/2015  CLINICAL DATA:  Altered mental  status for 3 weeks. History of colon cancer. Pulled out the colostomy bag. Sepsis. EXAM: CT ABDOMEN AND PELVIS WITHOUT CONTRAST TECHNIQUE: Multidetector CT imaging of the abdomen and pelvis was performed following the standard protocol without IV contrast. COMPARISON:  02/27/2015 FINDINGS: Mild dependent atelectasis in the lung bases. Evaluation of solid organs and vascular structures is limited without IV contrast material. There is a subcutaneous abscess in the left anterior abdominal wall extending from midline towards the flank muscles. This measures about 2.6 x 8.2 cm in transverse diameter and extends from about the level the pelvis to about the level of the anterior ribs. Gas and fluid consistent with abscess extending into the rectus abdominus muscle into the anterior intraperitoneal space. Loculated gas and fluid collections are demonstrated throughout the pelvis, measuring up to 9.9 x 12.2 cm in diameter. Loculated fluid collections extend up along the right pericolic gutter and para psoas region as well as in the right lower quadrant. These are likely additional abscesses. Since the previous study, there has been progression enlargement of the pelvic abscesses and the left abdominal wall abscesses are new. The unenhanced appearance of the liver, spleen, pancreas, adrenal glands, abdominal aorta, inferior vena cava, and retroperitoneal lymph nodes is unremarkable. There is a right percutaneous nephrostomy tube with tip in the renal pelvis. The right renal pelvis appears to be dilated. There is a left ureteral stent extending from the renal pelvis the bladder. The renal pelvis on the left appears mildly dilated as well. Stent functioning should be questioned. The bladder is mostly decompressed by the large loculated pelvic fluid collections. The gallbladder is distended with layering increased density suggesting sludge. There is an air-fluid level in the stomach without gastric distention. Scattered gas in  the small and large bowel. Small and large bowel are mostly decompressed. Right lower quadrant ostomy. No destructive bone lesions. IMPRESSION: Large loculated fluid collections in the pelvis and right lower  quadrant extending along the right anterior psoas region and associated with gas and fluid collections in the left anterior abdominal wall and left anterior retroperitoneum. Changes are consistent with multiple abscesses. Diffuse abdominal peritonitis is not excluded. Pelvic and right lower quadrant abscess collections are increased since previous study. The left abdominal wall collection has developed since that study. There is a right percutaneous nephrostomy tube and a left ureteral stent in place. There is persistent dilatation of the renal collecting systems. These results were called by telephone at the time of interpretation on 05/11/2015 at 1:32 am to Dr. Gareth Morgan , who verbally acknowledged these results. Electronically Signed   By: Lucienne Capers [name]   On: 05/11/2015 01:38   Dg Chest Port 1 View  05/10/2015  CLINICAL DATA:  Colon cancer with mental status changes. EXAM: PORTABLE CHEST 1 VIEW COMPARISON:  09/24/2011. FINDINGS: 1945 hrs. The lungs are clear wiithout focal pneumonia, edema, pneumothorax or pleural effusion. The cardiopericardial silhouette is within normal limits for size. Right Port-A-Cath tip is positioned over the mid SVC. The visualized bony structures of the thorax are intact. Telemetry leads overlie the chest. IMPRESSION: No acute cardiopulmonary findings. Electronically Signed   By: Misty Stanley [name]   On: 05/10/2015 20:26   I have personally reviewed and evaluated these images and lab results as part of my medical decision-making.   EKG Interpretation   Date/Time:  Friday May 10 2015 20:23:47 EST Ventricular Rate:  126 PR Interval:  120 QRS Duration: 76 QT Interval:  282 QTC Calculation: 408 R Axis:   72 Text Interpretation:  Sinus tachycardia Repol  abnormality inferior and  alteral leads, new from prior [place] Confirmed by [facility] MD, [place]  ([id number]) on 05/11/2015 12:03:20 AM       CRITICAL CARE: Sepsis secondary to intraabdominal abscess, AKI, metabolic acidosis Performed by: Alvino Chapel   Total critical care time: 30 minutes  Critical care time was exclusive of separately billable procedures and treating other patients.  Critical care was necessary to treat or prevent imminent or life-threatening deterioration.  Critical care was time spent personally by me on the following activities: development of treatment plan with patient and/or surrogate as well as nursing, discussions with consultants, evaluation of patient's response to treatment, examination of patient, obtaining history from patient or surrogate, ordering and performing treatments and interventions, ordering and review of laboratory studies, ordering and review of radiographic studies, pulse oximetry and re-evaluation of patient's condition.   Emergency Ultrasound:  US Guidance for needle guidance  Performed by Dr. Billy Fischer Indication: Difficulty obtaining labs, multiple attempts by nursing Linear probe used in real-time to visualize location of needle entry through skin. 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 Brendia Sacks) presents with concern for altered mental status.  History limited by patient's mental status, however per report, she has been at home for 3 weeks with son, had removed her ostomy, and on EMS arrival she was found  to be covered in stool, altered, and very dehydrated/malnourished appearance.   Patient febrile and tachycardic on arrival and code sepsis initiated.  Lactic acid of 8.  Given vanc/zosyn (19:39) UA concerning for UTI.  CXR shows no pneumonia.  There was a delay in CMP given hemolysis, difficult access. Discussed preference with accepting physicians of emergent transfer with CT at [facility] or obtaining here at [facility], and will obtain here.  CT abdomen noncontrast shows severe intraabdominal fluid collections.  Patient with AKI, Cr of 4, K 5.7, BUN 130s, pH 7.1 with pCO2 of 38, WBC 46.9, platelets 1264.  Altered mental status may be secondary to sepsis or uremia. AKI may be secondary to dehydration, sepsis, or obstruction given hx of bilateral ureteral stricture.  Patient given 30cc/kg IVF with repeat lactic acid trending down from 8 to 3.  Given additional 1L of NS. Patient with improved tachycardia, normal blood pressures, normal O2 on room air.  Given 5U insulin, d50, bicarb gtt for hyperkalemia and . No signs of hyperkalemic changes on ECG.   Discussed with Urology Dr. Bevelyn Ngo given hx prior to CT and Dr. Camelia Phenes of ED.  CT abdomen shows large loculated fluid collections. Continued dilation of renal collecting system. Updated [facility] ED regarding change and attempted to contact Emergency General Surgery at [facility] prior to transfer to [facility] ED.    Gareth Morgan, MD 05/11/15 [phone]

## 2015-05-11 NOTE — Progress Notes (Signed)
PHARMACY - VANCOMYCIN/ZOSYN  Full note written earlier, at which time labs were pending.  Scr resulted = 4.3 CrCl ~ 13.5 ml/min  Plan: Zosyn 2.25gm IV q8h          Vancomycin 750mg  IV q48h          Follow renal function  Leone Haven, PharmD 05/11/15 @ 00:37

## 2015-05-13 LAB — URINE CULTURE: Culture: >=100000

## 2015-05-13 LAB — PATHOLOGIST SMEAR REVIEW

## 2015-05-14 ENCOUNTER — Telehealth (HOSPITAL_BASED_OUTPATIENT_CLINIC_OR_DEPARTMENT_OTHER): Payer: Self-pay | Admitting: Emergency Medicine

## 2015-05-14 LAB — CULTURE, BLOOD (ROUTINE X 2)

## 2015-05-14 NOTE — Telephone Encounter (Signed)
Post ED Visit - Positive Culture Follow-up  Culture report reviewed by antimicrobial stewardship pharmacist:  []  Elenor Quinones, Pharm.D. [x]  Heide Guile, Pharm.D., BCPS []  Parks Neptune, Pharm.D. []  Alycia Rossetti, Pharm.D., BCPS []  [place], [place].D., BCPS, AAHIVP []  Legrand Como, Pharm.D., BCPS, AAHIVP []  Milus Glazier, Pharm.D. []  Stephens November, Pharm.D.  Positive urine culture E. coli Treated with none, patient transferred to [place], no further patient follow-up is required at this time.  Hazle Nordmann 05/14/2015, 9:03 AM

## 2015-05-15 LAB — CULTURE, BLOOD (ROUTINE X 2): Culture: NO GROWTH

## 2015-06-16 ENCOUNTER — Encounter (HOSPITAL_COMMUNITY): Payer: Self-pay | Admitting: Family Medicine

## 2015-06-16 ENCOUNTER — Emergency Department (HOSPITAL_COMMUNITY)
Admission: EM | Admit: 2015-06-16 | Discharge: 2015-06-17 | Disposition: A | Discharge status: Home / Self Care | Payer: Medicare Other | Attending: Emergency Medicine | Admitting: Emergency Medicine

## 2015-06-16 ENCOUNTER — Emergency Department (HOSPITAL_COMMUNITY): Payer: Medicare Other

## 2015-06-16 DIAGNOSIS — Z9221 Personal history of antineoplastic chemotherapy: Secondary | ICD-10-CM | POA: Insufficient documentation

## 2015-06-16 DIAGNOSIS — K449 Diaphragmatic hernia without obstruction or gangrene: Secondary | ICD-10-CM | POA: Diagnosis not present

## 2015-06-16 DIAGNOSIS — Z79899 Other long term (current) drug therapy: Secondary | ICD-10-CM | POA: Insufficient documentation

## 2015-06-16 DIAGNOSIS — K229 Disease of esophagus, unspecified: Secondary | ICD-10-CM | POA: Diagnosis not present

## 2015-06-16 DIAGNOSIS — T18128A Food in esophagus causing other injury, initial encounter: Secondary | ICD-10-CM | POA: Insufficient documentation

## 2015-06-16 DIAGNOSIS — G8929 Other chronic pain: Secondary | ICD-10-CM | POA: Diagnosis not present

## 2015-06-16 DIAGNOSIS — K222 Esophageal obstruction: Secondary | ICD-10-CM | POA: Diagnosis not present

## 2015-06-16 DIAGNOSIS — R1314 Dysphagia, pharyngoesophageal phase: Secondary | ICD-10-CM | POA: Insufficient documentation

## 2015-06-16 DIAGNOSIS — R69 Illness, unspecified: Secondary | ICD-10-CM

## 2015-06-16 DIAGNOSIS — Z85048 Personal history of other malignant neoplasm of rectum, rectosigmoid junction, and anus: Secondary | ICD-10-CM | POA: Diagnosis not present

## 2015-06-16 DIAGNOSIS — X58XXXA Exposure to other specified factors, initial encounter: Secondary | ICD-10-CM | POA: Insufficient documentation

## 2015-06-16 MED ORDER — PANTOPRAZOLE SODIUM 40 MG IV SOLR
40.0000 mg | Freq: Once | INTRAVENOUS | Status: AC
  Administered 2015-06-16: 40 mg via INTRAVENOUS
  Filled 2015-06-16: qty 40

## 2015-06-16 MED ORDER — SODIUM CHLORIDE 0.9 % IV SOLN
INTRAVENOUS | Status: DC
  Administered 2015-06-16: via INTRAVENOUS

## 2015-06-16 MED ORDER — HYDROMORPHONE HCL 1 MG/ML IJ SOLN
1.0000 mg | Freq: Once | INTRAMUSCULAR | Status: AC
  Administered 2015-06-16: 1 mg via INTRAVENOUS
  Filled 2015-06-16: qty 1

## 2015-06-16 NOTE — ED Provider Notes (Signed)
CSN: [id number]     Arrival date & time 06/16/15  2311 History  By signing my name below, I, Doran Stabler, attest that this documentation has been prepared under the direction and in the presence of No att. providers found. Electronically Signed: Doran Stabler, ED Scribe. 06/22/2015. 12:29 AM.   Chief Complaint  Patient presents with  . Airway Obstruction    The history is provided by the patient. No language interpreter was used.   HPI Comments: Heather Mayer is a 45 y.o. female with a PMHx of colon and rectal cancer who presents to the Emergency Department complaining of a possible airway obstruction that occurred 8 am this morning. Pt reports she is unable to take any of her medications nor is she able to keep her own saliva down because she thinks she has food stuck in her throat. She ate steak for breakfast at 8 AM. It stuck in her throat. She reports she has been able to bring a couple very small pieces up but it has persisted with pain and inability to take medications or fluids. Pt states she was recently discharged from [facility]. Pt denies any other symptoms at this time.   Pt last chemotherapy was in 2012 Past Medical History  Diagnosis Date  . 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 Kidney infection   . History of creation of ostomy    Past Surgical History  Procedure Laterality Date  . Abdominal surgery    . Ostomy take down    . Rectoperitoneal fistula closure    . Renal artery stent    . Esophagogastroduodenoscopy N/A 06/17/2015    Procedure: ESOPHAGOGASTRODUODENOSCOPY (EGD);  Surgeon: Ladene Artist, MD;  Location: [facility];  Service: Endoscopy;  Laterality: N/A;   History reviewed. No pertinent family history. Social History  Substance Use Topics  . Smoking status: Never Smoker   . Smokeless tobacco: None  . Alcohol Use: No   OB History    No data available     Review of Systems A  complete 10 system review of systems was obtained and all systems are negative except as noted in the HPI and PMH.   Allergies  Compazine and Latex  Home Medications   Prior to Admission medications   Medication Sig Start Date End Date Taking? Authorizing Provider  morphine (MS CONTIN) 30 MG 12 hr tablet Take 30 mg by mouth every 12 (twelve) hours. Reported on 06/17/2015 06/12/15 07/12/15 Yes Historical Provider, MD  Oxycodone HCl 10 MG TABS Take 10-15 mg by mouth every 4 (four) hours as needed (breakthrough pain).  06/13/15  Yes Historical Provider, MD  cephALEXin (KEFLEX) 250 MG/5ML suspension Take 10 mLs (500 mg total) by mouth 3 (three) times daily. Patient not taking: Reported on 02/26/2015 02/24/15   [name], MD  fluconazole (DIFLUCAN) 100 MG tablet Take 1 tablet (100 mg total) by mouth daily. 06/17/15 06/27/15  Ladene Artist, MD  fluconazole (DIFLUCAN) 150 MG tablet Take 1 tablet as needed for vaginal yeast infection. May repeat in 3 days if necessary. Patient not taking: Reported on 02/26/2015 02/24/15   [name], MD  zolpidem (AMBIEN) 10 MG tablet Take 1 tablet (10 mg total) by mouth at bedtime as needed for sleep. Patient not taking: Reported on 02/26/2015 07/27/13   Wyatt Portela, MD   BP 92/66 mmHg  Pulse 106  Temp(Src) 97.4 F (36.3 C) (Oral)  Resp 8  Ht 5\' 2"  (1.575 m)  Wt 100 lb (45.36 kg)  BMI 18.29 kg/m2  SpO2 100%   Physical Exam  Constitutional: She is oriented to person, place, and time. She appears well-developed and well-nourished.  HENT:  Head: Normocephalic and atraumatic.  Posterior oropharynx is widely patent.  A few white plaque  On tongue.  Neck: Neck supple.  Cardiovascular: Tachycardia present.  Exam reveals no gallop and no friction rub.   No murmur heard. Pulmonary/Chest: Effort normal. No stridor.  Abdominal: She exhibits no distension.   urostomy; colostomy and packed wound. Dressings cover most of abdomen.  Nondistended.  Musculoskeletal: She  exhibits no edema or tenderness.  No peripheral edema; calfs are soft and nontender  Neurological: She is alert and oriented to person, place, and time. She exhibits normal muscle tone. Coordination normal.  Skin: Skin is warm and dry. There is pallor.  Psychiatric: She has a normal mood and affect.  Nursing note and vitals reviewed.   ED Course  Procedures  DIAGNOSTIC STUDIES: Oxygen Saturation is 100% on room air, normal by my interpretation.    COORDINATION OF CARE: 11:37 PM Will give fluids, dilaudid and protonix. Will order CXR and blood work. Discussed treatment plan with pt at bedside and pt agreed to plan.  Labs Review Labs Reviewed  BASIC METABOLIC PANEL - Abnormal; Notable for the following:    Sodium 129 (*)    Chloride 92 (*)    CO2 21 (*)    Glucose, Bld 103 (*)    Creatinine, Ser 1.06 (*)    Calcium 7.7 (*)    Anion gap 16 (*)    All other components within normal limits  CBC WITH DIFFERENTIAL/PLATELET - Abnormal; Notable for the following:    WBC 18.7 (*)    RBC 3.61 (*)    Hemoglobin 9.5 (*)    HCT 31.2 (*)    RDW 20.7 (*)    Neutro Abs 14.6 (*)    Monocytes Absolute 1.9 (*)    All other components within normal limits    Imaging Review No results found. I have personally reviewed and evaluated these images and lab results as part of my medical decision-making.   EKG Interpretation None     Consultation: Patient's case reviewed with [facility] (23:55) will assemble team for EGD MDM   Final diagnoses:  Esophageal obstruction due to food impaction  Severe comorbid illness    Upper endoscopy done by GI. The patient is stable for discharge with recommendations as per GI for follow-up.   Charlesetta Shanks, MD 06/22/15 [phone]

## 2015-06-16 NOTE — ED Notes (Signed)
Patient reports she has food stuck in her throat. Pt ate steak at 8:00am this morning. Pt has been unable to take her medication and drink/eat any food today. Pt reports she is unable to keep her own saliva down. Pt is talking in full sentences with no acute respiratory distress. Pt recently had abd surgery and discharged from [facility].

## 2015-06-17 ENCOUNTER — Encounter (HOSPITAL_COMMUNITY): Payer: Self-pay | Admitting: *Deleted

## 2015-06-17 ENCOUNTER — Encounter (HOSPITAL_COMMUNITY)
Admission: EM | Disposition: A | Discharge status: Home / Self Care | Payer: Self-pay | Source: Home / Self Care | Attending: Emergency Medicine

## 2015-06-17 DIAGNOSIS — K222 Esophageal obstruction: Secondary | ICD-10-CM | POA: Diagnosis not present

## 2015-06-17 DIAGNOSIS — R1314 Dysphagia, pharyngoesophageal phase: Secondary | ICD-10-CM

## 2015-06-17 DIAGNOSIS — T18128A Food in esophagus causing other injury, initial encounter: Secondary | ICD-10-CM

## 2015-06-17 HISTORY — PX: ESOPHAGOGASTRODUODENOSCOPY: SHX5428

## 2015-06-17 LAB — CBC WITH DIFFERENTIAL/PLATELET
BASOS PCT: 0 %
Basophils Absolute: 0 10*3/uL (ref 0.0–0.1)
EOS PCT: 0 %
Eosinophils Absolute: 0 10*3/uL (ref 0.0–0.7)
HEMATOCRIT: 31.2 % — AB (ref 36.0–46.0)
HEMOGLOBIN: 9.5 g/dL — AB (ref 12.0–15.0)
LYMPHS ABS: 2.2 10*3/uL (ref 0.7–4.0)
Lymphocytes Relative: 12 %
MCH: 26.3 pg (ref 26.0–34.0)
MCHC: 30.4 g/dL (ref 30.0–36.0)
MCV: 86.4 fL (ref 78.0–100.0)
MONOS PCT: 10 %
Monocytes Absolute: 1.9 10*3/uL — ABNORMAL HIGH (ref 0.1–1.0)
Neutro Abs: 14.6 10*3/uL — ABNORMAL HIGH (ref 1.7–7.7)
Neutrophils Relative %: 78 %
Platelets: 307 10*3/uL (ref 150–400)
RBC: 3.61 MIL/uL — AB (ref 3.87–5.11)
RDW: 20.7 % — ABNORMAL HIGH (ref 11.5–15.5)
WBC: 18.7 10*3/uL — AB (ref 4.0–10.5)

## 2015-06-17 LAB — BASIC METABOLIC PANEL
Anion gap: 16 — ABNORMAL HIGH (ref 5–15)
BUN: 18 mg/dL (ref 6–20)
CHLORIDE: 92 mmol/L — AB (ref 101–111)
CO2: 21 mmol/L — AB (ref 22–32)
Calcium: 7.7 mg/dL — ABNORMAL LOW (ref 8.9–10.3)
Creatinine, Ser: 1.06 mg/dL — ABNORMAL HIGH (ref 0.44–1.00)
GFR calc Af Amer: >60 mL/min (ref >60)
GFR calc non Af Amer: >60 mL/min (ref >60)
GLUCOSE: 103 mg/dL — AB (ref 65–99)
POTASSIUM: 4.4 mmol/L (ref 3.5–5.1)
SODIUM: 129 mmol/L — AB (ref 135–145)

## 2015-06-17 SURGERY — EGD (ESOPHAGOGASTRODUODENOSCOPY)
Anesthesia: Moderate Sedation

## 2015-06-17 MED ORDER — FENTANYL CITRATE (PF) 100 MCG/2ML IJ SOLN
INTRAMUSCULAR | Status: AC
  Filled 2015-06-17: qty 4

## 2015-06-17 MED ORDER — FLUCONAZOLE 100 MG PO TABS: 100.0000 mg | ORAL_TABLET | Freq: Every day | ORAL | Status: AC

## 2015-06-17 MED ORDER — FENTANYL CITRATE (PF) 100 MCG/2ML IJ SOLN
INTRAMUSCULAR | Status: DC | PRN
  Administered 2015-06-17 (×3): 25 ug via INTRAVENOUS

## 2015-06-17 MED ORDER — DIPHENHYDRAMINE HCL 50 MG/ML IJ SOLN
INTRAMUSCULAR | Status: AC
  Filled 2015-06-17: qty 1

## 2015-06-17 MED ORDER — MIDAZOLAM HCL 10 MG/2ML IJ SOLN
INTRAMUSCULAR | Status: DC | PRN
  Administered 2015-06-17 (×3): 2 mg via INTRAVENOUS

## 2015-06-17 MED ORDER — MIDAZOLAM HCL 5 MG/ML IJ SOLN
INTRAMUSCULAR | Status: AC
  Filled 2015-06-17: qty 3

## 2015-06-17 MED ORDER — HEPARIN SOD (PORK) LOCK FLUSH 100 UNIT/ML IV SOLN
500.0000 [IU] | Freq: Once | INTRAVENOUS | Status: AC
  Administered 2015-06-17: 500 [IU]
  Filled 2015-06-17: qty 5

## 2015-06-17 MED ORDER — BUTAMBEN-TETRACAINE-BENZOCAINE 2-2-14 % EX AERO
INHALATION_SPRAY | CUTANEOUS | Status: DC | PRN
  Administered 2015-06-17: 2 via TOPICAL

## 2015-06-17 MED ORDER — DIPHENHYDRAMINE HCL 50 MG/ML IJ SOLN
INTRAMUSCULAR | Status: DC | PRN
  Administered 2015-06-17: 25 mg via INTRAVENOUS

## 2015-06-17 NOTE — Discharge Instructions (Signed)
Esophagitis °Esophagitis is inflammation of the esophagus. The esophagus is the tube that carries food and liquids from your mouth to your stomach. Esophagitis can cause soreness or pain in the esophagus. This condition can make it difficult and painful to swallow.  °CAUSES °Most causes of esophagitis are not serious. Common causes of this condition include: °· Gastroesophageal reflux disease (GERD). This is when stomach contents move back up into the esophagus (reflux). °· Repeated vomiting. °· An allergic-type reaction, especially caused by food allergies (eosinophilic esophagitis). °· Injury to the esophagus by swallowing large pills with or without water, or swallowing certain types of medicines. °· Swallowing (ingesting) harmful chemicals, such as household cleaning products. °· Heavy alcohol use. °· An infection of the esophagus. This most often occurs in people who have a weakened immune system. °· Radiation or chemotherapy treatment for cancer. °· Certain diseases such as sarcoidosis, Crohn disease, and scleroderma. °SYMPTOMS °Symptoms of this condition include: °· Difficult or painful swallowing. °· Pain with swallowing acidic liquids, such as citrus juices. °· Pain with burping. °· Chest pain. °· Difficulty breathing. °· Nausea. °· Vomiting. °· Pain in the abdomen. °· Weight loss. °· Ulcers in the mouth. °· Patches of white material in the mouth (candidiasis). °· Fever. °· Coughing up blood or vomiting blood. °· Stool that is black, tarry, or bright red. °DIAGNOSIS °Your health care provider will take a medical history and perform a physical exam. You may also have other tests, including: °· An endoscopy to examine your stomach and esophagus with a small camera. °· A test that measures the acidity level in your esophagus. °· A test that measures how much pressure is on your esophagus. °· A barium swallow or modified barium swallow to show the shape, size, and functioning of your esophagus. °· Allergy  tests. °TREATMENT °Treatment for this condition depends on the cause of your esophagitis. In some cases, steroids or other medicines may be given to help relieve your symptoms or to treat the underlying cause of your condition. You may have to make some lifestyle changes, such as: °· Avoiding alcohol. °· Quitting smoking. °· Changing your diet. °· Exercising. °· Changing your sleep habits and your sleep environment. °HOME CARE INSTRUCTIONS °Take these actions to decrease your discomfort and to help avoid complications. °Diet °· Follow a diet as recommended by your health care provider. This may involve avoiding foods and drinks such as: °¨ Coffee and tea (with or without caffeine). °¨ Drinks that contain alcohol. °¨ Energy drinks and sports drinks. °¨ Carbonated drinks or sodas. °¨ Chocolate and cocoa. °¨ Peppermint and mint flavorings. °¨ Garlic and onions. °¨ Horseradish. °¨ Spicy and acidic foods, including peppers, chili powder, curry powder, vinegar, hot sauces, and barbecue sauce. °¨ Citrus fruit juices and citrus fruits, such as oranges, lemons, and limes. °¨ Tomato-based foods, such as red sauce, chili, salsa, and pizza with red sauce. °¨ Fried and fatty foods, such as donuts, french fries, potato chips, and high-fat dressings. °¨ High-fat meats, such as hot dogs and fatty cuts of red and white meats, such as rib eye steak, sausage, ham, and bacon. °¨ High-fat dairy items, such as whole milk, butter, and cream cheese. °· Eat small, frequent meals instead of large meals. °· Avoid drinking large amounts of liquid with your meals. °· Avoid eating meals during the 2-3 hours before bedtime. °· Avoid lying down right after you eat. °· Do not exercise right after you eat. °· Avoid foods and drinks that seem to   make your symptoms worse. °General Instructions °· Pay attention to any changes in your symptoms. °· Take over-the-counter and prescription medicines only as told by your health care provider. Do not take  aspirin, ibuprofen, or other NSAIDs unless your health care provider told you to do so. °· If you have trouble taking pills, use a pill splitter to decrease the size of the pill. This will decrease the chance of the pill getting stuck or injuring your esophagus on the way down. Also, drink water after you take a pill. °· Do not use any tobacco products, including cigarettes, chewing tobacco, and e-cigarettes. If you need help quitting, ask your health care provider. °· Wear loose-fitting clothing. Do not wear anything tight around your waist that causes pressure on your abdomen. °· Raise (elevate) the head of your bed about 6 inches (15 cm). °· Try to reduce your stress, such as with yoga or meditation. If you need help reducing stress, ask your health care provider. °· If you are overweight, reduce your weight to an amount that is healthy for you. Ask your health care provider for guidance about a safe weight loss goal. °· Keep all follow-up visits as told by your health care provider. This is important. °SEEK MEDICAL CARE IF: °· You have new symptoms. °· You have unexplained weight loss. °· You have difficulty swallowing, or it hurts to swallow. °· You have wheezing or a persistent cough. °· Your symptoms do not improve with treatment. °· You have frequent heartburn for more than two weeks. °SEEK IMMEDIATE MEDICAL CARE IF: °· You have severe pain in your arms, neck, jaw, teeth, or back. °· You feel sweaty, dizzy, or light-headed. °· You have chest pain or shortness of breath. °· You vomit and your vomit looks like blood or coffee grounds. °· Your stool is bloody or black. °· You have a fever. °· You cannot swallow, drink, or eat. °  °This information is not intended to replace advice given to you by your health care provider. Make sure you discuss any questions you have with your health care provider. °  °Document Released: 05/07/2004 Document Revised: 12/19/2014 Document Reviewed: 07/25/2014 °Elsevier Interactive  Patient Education ©2016 [employer] ° °

## 2015-06-17 NOTE — H&P (Addendum)
    History of Present Illness: This is a 45 year old female referred by Dr. Ezzie Dural in the [facility] ED for the evaluation of dysphagia. She is followed closely at [facility] and was recently hospitalized at [facility]. Pt has intermittent heartburn taking OTC Zantac prn. She was eating steak about 8 am yesterday and it became lodged and she has been unable to swallow or handle secretion since then. No breathing difficulty or choking. No history of dysphagia, stricture, ulcer. Complicated history of perforated rectal adenocarcinoma in 2011 S/P pelvic exoneration including TAH, BSO, rectal resection, colostomy, omentectomy. Received intraperitoneal hypothermic chemotherapy. Chronic pain followed at a pain clinic.   Review of Systems: Pertinent positive and negative review of systems were noted in the above HPI section. All other review of systems were otherwise negative.  Current Medications, Allergies, Past Medical History, Past Surgical History, Family History and Social History were reviewed in [employer] record.  Physical Exam: General: Well developed, well nourished, thin, no acute distress Head: Normocephalic and atraumatic Eyes:  sclerae anicteric, EOMI Ears: Normal auditory acuity Mouth: No deformity or lesions Neck: Supple, no masses or thyromegaly Lungs: Clear throughout to auscultation Heart: Regular rate and rhythm; no murmurs, rubs or bruits Abdomen: Soft, non tender and non distended. Colostomy in RLQ. No masses, hepatosplenomegaly or hernias noted. Normal Bowel sounds Musculoskeletal: Symmetrical with no gross deformities  Skin: No lesions on visible extremities Pulses:  Normal pulses noted Extremities: No clubbing, cyanosis, edema or deformities noted Neurological: Alert oriented x 4, grossly nonfocal Cervical Nodes:  No significant cervical adenopathy Inguinal Nodes: No significant inguinal adenopathy Psychological:  Alert and cooperative. Anxious  Assessment  and Recommendations:  1. Acute esophageal food impaction unable to handle secretions. Urgent EGD. The risks (including bleeding, perforation, infection, missed lesions, medication reactions and possible hospitalization or surgery if complications occur), benefits, and alternatives to endoscopy with possible biopsy and possible dilation were discussed with the patient and they consent to proceed. Pt advised to have follow up GI care at [facility].    cc: Ezzie Dural, MD

## 2015-06-17 NOTE — Op Note (Signed)
[facility] [place], [address]   ENDOSCOPY PROCEDURE REPORT  PATIENT: Heather Mayer, Heather Mayer  MR#: [id number] BIRTHDATE: 1970/12/25 , 44  yrs. old GENDER: female ENDOSCOPIST: Ladene Artist, MD, [facility] REFERRED BY:  Dirk Dress EDP PROCEDURE DATE:  06/17/2015 PROCEDURE:  EGD w/ fb removal ASA CLASS:     Class III INDICATIONS:  dysphagia and foreign body removal from esophagus. MEDICATIONS: Fentanyl 75 mcg IV, Versed 6 mg IV, and Benadryl 25 mg IV TOPICAL ANESTHETIC: Cetacaine Spray DESCRIPTION OF PROCEDURE: After the risks benefits and alternatives of the procedure were thoroughly explained, informed consent was obtained.  The Pentax Gastroscope [id number] endoscope was introduced through the mouth and advanced to the second portion of the duodenum , Without limitations.  The instrument was slowly withdrawn as the mucosa was fully examined.  ESOPHAGUS: There was a meat impaction in the proximal esophagus. It was gently advanced into the stomach. Subtle narrowing in proximal esophagus however this area was not well visualized due to extensive white exudates consistent with candidiasis found in the proximal and mid esophagus.  There were several benign appearing strictures in the distal esophagus with superficial linear mucosal tears noted after passage of the endoscope, query eosinophilic esophagits.  The strictures were traversable. The meat temorarily held up at the distal esophageal strictures and was then advanced into the stomach. STOMACH: The mucosa and folds of the stomach appeared normal. DUODENUM: The duodenal mucosa showed no abnormalities in the bulb and 2nd part of the duodenum.  Retroflexed views revealed a small hiatal hernia.  The scope was then withdrawn from the patient and the procedure completed.  COMPLICATIONS: There were no immediate complications.  ENDOSCOPIC IMPRESSION: 1.   Meat impaction in the proximal esophagus which was cleared 2.    White exudates consistent with candidiasis in the mid and proximal esophagus 3.   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   Small hiatal hernia  RECOMMENDATIONS: 1.  Anti-reflux regimen 2.  Diflucan 100 mg po qam for 10 days 3.  Zantac OTC 150 mg po bid 4.  GI appt at [facility] for ongoing GI care 5.  Soft foods only until GI follow up, avoid all meat and bread  eSigned:  Ladene Artist, MD, [facility] 06/17/2015 1:59 AM

## 2015-06-25 ENCOUNTER — Inpatient Hospital Stay
Admit: 2015-06-25 | Discharge: 2015-07-04 | Disposition: A | Discharge status: Skilled Nursing Facility | Payer: MEDICARE | Source: Home / Self Care | Attending: Internal Medicine | Admitting: Internal Medicine

## 2015-06-25 ENCOUNTER — Encounter: Admit: 2015-06-25 | Primary: Internal Medicine

## 2015-06-25 DIAGNOSIS — R64 Cachexia: Secondary | ICD-10-CM

## 2015-06-25 LAB — COMPREHENSIVE METABOLIC PANEL
ALT: 30 U/L (ref 10–40)
ALT: 25 U/L (ref 10–40)
ALT: 30 U/L (ref 10–40)
AST: 20 U/L (ref 15–37)
AST: 18 U/L (ref 15–37)
AST: 34 U/L (ref 15–37)
Albumin/Globulin Ratio: 0.4 — ABNORMAL LOW (ref 1.1–2.2)
Albumin/Globulin Ratio: 0.4 — ABNORMAL LOW (ref 1.1–2.2)
Albumin/Globulin Ratio: 0.3 — ABNORMAL LOW (ref 1.1–2.2)
Albumin: 1.7 g/dL — ABNORMAL LOW (ref 3.4–5.0)
Albumin: 1.3 g/dL — ABNORMAL LOW (ref 3.4–5.0)
Albumin: 1.3 g/dL — ABNORMAL LOW (ref 3.4–5.0)
Alkaline Phosphatase: 151 U/L — ABNORMAL HIGH (ref 40–129)
Alkaline Phosphatase: 119 U/L (ref 40–129)
Alkaline Phosphatase: 143 U/L — ABNORMAL HIGH (ref 40–129)
Anion Gap: 18 — ABNORMAL HIGH (ref 3–16)
Anion Gap: 14 (ref 3–16)
Anion Gap: 19 — ABNORMAL HIGH (ref 3–16)
BUN: 50 mg/dL — ABNORMAL HIGH (ref 7–20)
BUN: 43 mg/dL — ABNORMAL HIGH (ref 7–20)
BUN: 44 mg/dL — ABNORMAL HIGH (ref 7–20)
CO2: 20 mmol/L — ABNORMAL LOW (ref 21–32)
CO2: 22 mmol/L (ref 21–32)
CO2: 16 mmol/L — ABNORMAL LOW (ref 21–32)
Calcium: 7.3 mg/dL — ABNORMAL LOW (ref 8.3–10.6)
Calcium: 6.5 mg/dL — ABNORMAL LOW (ref 8.3–10.6)
Calcium: 7.3 mg/dL — ABNORMAL LOW (ref 8.3–10.6)
Chloride: 93 mmol/L — ABNORMAL LOW (ref 99–110)
Chloride: 99 mmol/L (ref 99–110)
Chloride: 97 mmol/L — ABNORMAL LOW (ref 99–110)
Creatinine: 1.5 mg/dL — ABNORMAL HIGH (ref 0.6–1.1)
Creatinine: 1.4 mg/dL — ABNORMAL HIGH (ref 0.6–1.1)
Creatinine: 1.2 mg/dL — ABNORMAL HIGH (ref 0.6–1.1)
GFR African American: 46 — AB (ref >60)
GFR African American: 50 — AB (ref >60)
GFR African American: 59 — AB (ref >60)
GFR Non-African American: 38 — AB (ref >60)
GFR Non-African American: 41 — AB (ref >60)
GFR Non-African American: 49 — AB (ref >60)
Globulin: 4 g/dL
Globulin: 3.4 g/dL
Globulin: 3.8 g/dL
Glucose: 99 mg/dL (ref 70–99)
Glucose: 142 mg/dL — ABNORMAL HIGH (ref 70–99)
Glucose: 93 mg/dL (ref 70–99)
Potassium: 6.1 mmol/L (ref 3.5–5.1)
Potassium: 3.9 mmol/L (ref 3.5–5.1)
Potassium: 5.2 mmol/L — ABNORMAL HIGH (ref 3.5–5.1)
Sodium: 131 mmol/L — ABNORMAL LOW (ref 136–145)
Sodium: 135 mmol/L — ABNORMAL LOW (ref 136–145)
Sodium: 132 mmol/L — ABNORMAL LOW (ref 136–145)
Total Bilirubin: 0.7 mg/dL (ref 0.0–1.0)
Total Bilirubin: 0.6 mg/dL (ref 0.0–1.0)
Total Bilirubin: 0.7 mg/dL (ref 0.0–1.0)
Total Protein: 5.7 g/dL — ABNORMAL LOW (ref 6.4–8.2)
Total Protein: 4.7 g/dL — ABNORMAL LOW (ref 6.4–8.2)
Total Protein: 5.1 g/dL — ABNORMAL LOW (ref 6.4–8.2)

## 2015-06-25 LAB — CBC WITH AUTO DIFFERENTIAL
Bands Relative: 18 % — ABNORMAL HIGH (ref 0–7)
Basophils %: 0 %
Basophils Absolute: 0 10*3/uL (ref 0.0–0.2)
Eosinophils %: 0 %
Eosinophils Absolute: 0 10*3/uL (ref 0.0–0.6)
Hematocrit: 31.6 % — ABNORMAL LOW (ref 36.0–48.0)
Hemoglobin: 9.4 g/dL — ABNORMAL LOW (ref 12.0–16.0)
Lymphocytes %: 2 %
Lymphocytes Absolute: 1.1 10*3/uL (ref 1.0–5.1)
MCH: 25.3 pg — ABNORMAL LOW (ref 26.0–34.0)
MCHC: 29.7 g/dL — ABNORMAL LOW (ref 31.0–36.0)
MCV: 85.2 fL (ref 80.0–100.0)
MPV: 7.7 fL (ref 5.0–10.5)
Metamyelocytes Relative: 1 % — AB
Monocytes %: 4 %
Monocytes Absolute: 2.2 10*3/uL — ABNORMAL HIGH (ref 0.0–1.3)
Neutrophils %: 75 %
Neutrophils Absolute: 52.3 10*3/uL — ABNORMAL HIGH (ref 1.7–7.7)
Platelets: 187 10*3/uL (ref 135–450)
RBC: 3.71 M/uL — ABNORMAL LOW (ref 4.00–5.20)
RDW: 23.3 % — ABNORMAL HIGH (ref 12.4–15.4)
WBC: 55.6 10*3/uL (ref 4.0–11.0)

## 2015-06-25 LAB — APTT: aPTT: 49.9 s — ABNORMAL HIGH (ref 21.0–31.8)

## 2015-06-25 LAB — PROTIME-INR
INR: 1.56 — ABNORMAL HIGH (ref 0.85–1.15)
Protime: 17.6 s — ABNORMAL HIGH (ref 9.6–13.0)

## 2015-06-25 LAB — POCT GLUCOSE: POC Glucose: 193 mg/dl — ABNORMAL HIGH (ref 70–99)

## 2015-06-25 LAB — PERIPHERAL BLOOD SMEAR, PATH REVIEW

## 2015-06-25 LAB — LACTIC ACID
Lactic Acid: 2.2 mmol/L — ABNORMAL HIGH (ref 0.4–2.0)
Lactic Acid: 4 mmol/L (ref 0.4–2.0)

## 2015-06-25 LAB — LIPASE: Lipase: 11 U/L — ABNORMAL LOW (ref 13.0–60.0)

## 2015-06-25 MED ORDER — VANCOMYCIN HCL IN DEXTROSE 500-5 MG/100ML-% IV SOLN
500-5 MG/100ML-% | INTRAVENOUS | Status: DC
Start: 2015-06-25 — End: 2015-07-04
  Administered 2015-06-26 – 2015-07-04 (×9): 500 mg via INTRAVENOUS

## 2015-06-25 MED ORDER — ACETAMINOPHEN 325 MG PO TABS
325 MG | ORAL | Status: DC | PRN
Start: 2015-06-25 — End: 2015-07-04

## 2015-06-25 MED ORDER — NORMAL SALINE FLUSH 0.9 % IV SOLN
0.9 % | INTRAVENOUS | Status: DC | PRN
Start: 2015-06-25 — End: 2015-06-27

## 2015-06-25 MED ORDER — FENTANYL PATCH REMOVAL
Status: DC
Start: 2015-06-25 — End: 2015-06-25

## 2015-06-25 MED ORDER — NORMAL SALINE FLUSH 0.9 % IV SOLN
0.9 % | Freq: Two times a day (BID) | INTRAVENOUS | Status: DC
Start: 2015-06-25 — End: 2015-07-04
  Administered 2015-06-25 – 2015-07-04 (×18): 10 mL via INTRAVENOUS

## 2015-06-25 MED ORDER — HYDROMORPHONE HCL 1 MG/ML IJ SOLN
1 MG/ML | INTRAMUSCULAR | Status: DC | PRN
Start: 2015-06-25 — End: 2015-07-04
  Administered 2015-06-26 – 2015-07-04 (×51): 1 mg via INTRAVENOUS

## 2015-06-25 MED ORDER — HYDROMORPHONE HCL 1 MG/ML IJ SOLN
1 MG/ML | Freq: Once | INTRAMUSCULAR | Status: AC
Start: 2015-06-25 — End: 2015-06-25

## 2015-06-25 MED ORDER — HYDROMORPHONE 0.5MG/0.5ML IJ SOLN
1 MG/ML | Status: DC | PRN
Start: 2015-06-25 — End: 2015-06-25

## 2015-06-25 MED ORDER — SODIUM CHLORIDE 0.9 % IV SOLN
0.9 % | INTRAVENOUS | Status: AC
Start: 2015-06-25 — End: 2015-06-25
  Administered 2015-06-25: 16:00:00 1000

## 2015-06-25 MED ORDER — ENOXAPARIN SODIUM 30 MG/0.3ML SC SOLN
30 MG/0.3ML | Freq: Every day | SUBCUTANEOUS | Status: DC
Start: 2015-06-25 — End: 2015-06-27
  Administered 2015-06-27: 15:00:00 30 mg via SUBCUTANEOUS

## 2015-06-25 MED ORDER — DEXTROSE 50 % IV SOLN
50 % | Freq: Once | INTRAVENOUS | Status: AC
Start: 2015-06-25 — End: 2015-06-25
  Administered 2015-06-25: 12:00:00 25 g via INTRAVENOUS

## 2015-06-25 MED ORDER — METRONIDAZOLE IN NACL 5-0.79 MG/ML-% IV SOLN
5 mg/mL | Freq: Once | INTRAVENOUS | Status: DC
Start: 2015-06-25 — End: 2015-06-25

## 2015-06-25 MED ORDER — VANCOMYCIN HCL 1000 MG IV SOLR
1000 MG | Freq: Once | INTRAVENOUS | Status: AC
Start: 2015-06-25 — End: 2015-06-25
  Administered 2015-06-25: 13:00:00 750 mg via INTRAVENOUS

## 2015-06-25 MED ORDER — HYDROMORPHONE 0.5MG/0.5ML IJ SOLN
1 MG/ML | Status: DC | PRN
Start: 2015-06-25 — End: 2015-07-04
  Administered 2015-06-26 – 2015-06-30 (×2): 0.5 mg via INTRAVENOUS

## 2015-06-25 MED ORDER — MAGNESIUM SULFATE IN D5W 10-5 MG/ML-% IV SOLN
10-5 MG/ML-% | INTRAVENOUS | Status: DC | PRN
Start: 2015-06-25 — End: 2015-07-04

## 2015-06-25 MED ORDER — SODIUM BICARBONATE 8.4 % IV SOLN
8.4 % | Freq: Once | INTRAVENOUS | Status: AC
Start: 2015-06-25 — End: 2015-06-25
  Administered 2015-06-25: 12:00:00 50 meq via INTRAVENOUS

## 2015-06-25 MED ORDER — FLUCONAZOLE IN SODIUM CHLORIDE 200-0.9 MG/100ML-% IV SOLN
INTRAVENOUS | Status: DC
Start: 2015-06-25 — End: 2015-07-04
  Administered 2015-06-26 – 2015-07-04 (×9): 200 mg via INTRAVENOUS

## 2015-06-25 MED ORDER — LORAZEPAM 2 MG/ML IJ SOLN
2 MG/ML | Freq: Once | INTRAMUSCULAR | Status: DC
Start: 2015-06-25 — End: 2015-06-25

## 2015-06-25 MED ORDER — POTASSIUM CHLORIDE 20 MEQ/50ML IV SOLN
20 MEQ/50ML | INTRAVENOUS | Status: DC | PRN
Start: 2015-06-25 — End: 2015-07-04
  Administered 2015-06-26 – 2015-07-02 (×10): 20 meq via INTRAVENOUS

## 2015-06-25 MED ORDER — IPRATROPIUM-ALBUTEROL 0.5-2.5 (3) MG/3ML IN SOLN
RESPIRATORY_TRACT | Status: DC | PRN
Start: 2015-06-25 — End: 2015-06-26

## 2015-06-25 MED ORDER — DEXTROSE 5 % IV SOLN
5 % | Freq: Once | INTRAVENOUS | Status: AC
Start: 2015-06-25 — End: 2015-06-25
  Administered 2015-06-25: 23:00:00 1 g via INTRAVENOUS

## 2015-06-25 MED ORDER — INSULIN REGULAR HUMAN 100 UNIT/ML IJ SOLN
100 UNIT/ML | Freq: Once | INTRAMUSCULAR | Status: AC
Start: 2015-06-25 — End: 2015-06-25
  Administered 2015-06-25: 23:00:00 10 [IU] via INTRAVENOUS

## 2015-06-25 MED ORDER — SODIUM CHLORIDE 0.9 % IV BOLUS
0.9 % | Freq: Once | INTRAVENOUS | Status: AC
Start: 2015-06-25 — End: 2015-06-25
  Administered 2015-06-25: 12:00:00 1000 mL via INTRAVENOUS

## 2015-06-25 MED ORDER — SODIUM CHLORIDE 0.9 % IV BOLUS
0.9 % | Freq: Once | INTRAVENOUS | Status: AC
Start: 2015-06-25 — End: 2015-06-25
  Administered 2015-06-25: 10:00:00 2000 mL via INTRAVENOUS

## 2015-06-25 MED ORDER — CALCIUM GLUCONATE 10 % IV SOLN
10 % | Freq: Once | INTRAVENOUS | Status: AC
Start: 2015-06-25 — End: 2015-06-25
  Administered 2015-06-25: 17:00:00 1 g via INTRAVENOUS

## 2015-06-25 MED ORDER — NORMAL SALINE FLUSH 0.9 % IV SOLN
0.9 % | Freq: Two times a day (BID) | INTRAVENOUS | Status: DC
Start: 2015-06-25 — End: 2015-07-04
  Administered 2015-06-26 – 2015-07-04 (×15): 10 mL via INTRAVENOUS

## 2015-06-25 MED ORDER — FENTANYL 25 MCG/HR TD PT72
25 MCG/HR | TRANSDERMAL | Status: DC
Start: 2015-06-25 — End: 2015-07-04
  Administered 2015-06-26 – 2015-07-04 (×4): 1 via TRANSDERMAL

## 2015-06-25 MED ORDER — FLUCONAZOLE IN SODIUM CHLORIDE 200-0.9 MG/100ML-% IV SOLN
Freq: Once | INTRAVENOUS | Status: AC
Start: 2015-06-25 — End: 2015-06-25
  Administered 2015-06-25: 12:00:00 200 mg via INTRAVENOUS

## 2015-06-25 MED ORDER — DEXTROSE 50 % IV SOLN
50 % | Freq: Once | INTRAVENOUS | Status: AC
Start: 2015-06-25 — End: 2015-06-25
  Administered 2015-06-25: 23:00:00 25 g via INTRAVENOUS

## 2015-06-25 MED ORDER — POTASSIUM CHLORIDE 10 MEQ/100ML IV SOLN
10 MEQ/0ML | INTRAVENOUS | Status: DC | PRN
Start: 2015-06-25 — End: 2015-07-04

## 2015-06-25 MED ORDER — PIPERACILLIN-TAZOBACTAM IN DEX 2-0.25 GM/50ML IV SOLN
Freq: Four times a day (QID) | INTRAVENOUS | Status: DC
Start: 2015-06-25 — End: 2015-06-25

## 2015-06-25 MED ORDER — HYDROMORPHONE 0.5MG/0.5ML IJ SOLN
1 MG/ML | Status: DC | PRN
Start: 2015-06-25 — End: 2015-06-25
  Administered 2015-06-25: 10:00:00 0.5 mg via INTRAVENOUS

## 2015-06-25 MED ORDER — ONDANSETRON HCL 4 MG/2ML IJ SOLN
4 MG/2ML | Freq: Once | INTRAMUSCULAR | Status: AC
Start: 2015-06-25 — End: 2015-06-25
  Administered 2015-06-25: 10:00:00 4 mg via INTRAVENOUS

## 2015-06-25 MED ORDER — DEXTROSE 5 % IV SOLN
5 % | Freq: Two times a day (BID) | INTRAVENOUS | Status: DC
Start: 2015-06-25 — End: 2015-06-25

## 2015-06-25 MED ORDER — HYDROMORPHONE HCL 1 MG/ML IJ SOLN
1 MG/ML | INTRAMUSCULAR | Status: AC
Start: 2015-06-25 — End: 2015-06-25
  Administered 2015-06-25: 13:00:00 1 via INTRAVENOUS

## 2015-06-25 MED ORDER — HYDROMORPHONE 0.5MG/0.5ML IJ SOLN
1 MG/ML | Status: DC | PRN
Start: 2015-06-25 — End: 2015-06-25
  Administered 2015-06-25 (×2): 0.5 mg via INTRAVENOUS

## 2015-06-25 MED ORDER — PIPERACILLIN-TAZOBACTAM 3.375 G IN 50 ML (PREMIX) IVPB EXTENDED
Freq: Three times a day (TID) | INTRAVENOUS | Status: DC
Start: 2015-06-25 — End: 2015-07-02
  Administered 2015-06-25 – 2015-07-02 (×20): 3.375 g via INTRAVENOUS

## 2015-06-25 MED ORDER — METRONIDAZOLE IN NACL 5-0.79 MG/ML-% IV SOLN
5 mg/mL | Freq: Three times a day (TID) | INTRAVENOUS | Status: DC
Start: 2015-06-25 — End: 2015-07-01
  Administered 2015-06-25 – 2015-07-01 (×18): 500 mg via INTRAVENOUS

## 2015-06-25 MED ORDER — PIPERACILLIN-TAZOBACTAM IN DEX 4-0.5 GM/100ML IV SOLN
Freq: Once | INTRAVENOUS | Status: AC
Start: 2015-06-25 — End: 2015-06-25
  Administered 2015-06-25: 12:00:00 4.5 g via INTRAVENOUS

## 2015-06-25 MED ORDER — INSULIN REGULAR HUMAN 100 UNIT/ML IJ SOLN
100 UNIT/ML | Freq: Once | INTRAMUSCULAR | Status: AC
Start: 2015-06-25 — End: 2015-06-25
  Administered 2015-06-25: 12:00:00 10 [IU] via INTRAVENOUS

## 2015-06-25 MED ORDER — ONDANSETRON HCL 4 MG/2ML IJ SOLN
4 MG/2ML | Freq: Four times a day (QID) | INTRAMUSCULAR | Status: DC | PRN
Start: 2015-06-25 — End: 2015-07-04

## 2015-06-25 MED ORDER — FAMOTIDINE 20 MG/2ML IV SOLN
20 MG/2ML | Freq: Every day | INTRAVENOUS | Status: DC
Start: 2015-06-25 — End: 2015-07-04
  Administered 2015-06-25 – 2015-07-04 (×10): 20 mg via INTRAVENOUS

## 2015-06-25 MED ORDER — IPRATROPIUM-ALBUTEROL 0.5-2.5 (3) MG/3ML IN SOLN
Freq: Four times a day (QID) | RESPIRATORY_TRACT | Status: DC
Start: 2015-06-25 — End: 2015-06-25
  Administered 2015-06-25: 17:00:00 1 via RESPIRATORY_TRACT

## 2015-06-25 MED ORDER — SODIUM BICARBONATE 8.4 % IV SOLN
8.4 % | Freq: Once | INTRAVENOUS | Status: AC
Start: 2015-06-25 — End: 2015-06-25
  Administered 2015-06-25: 23:00:00 50 meq via INTRAVENOUS

## 2015-06-25 MED ORDER — LIDOCAINE HCL (PF) 1 % IJ SOLN
1 % | Freq: Once | INTRAMUSCULAR | Status: AC | PRN
Start: 2015-06-25 — End: 2015-06-25

## 2015-06-25 MED FILL — DEXTROSE 50 % IV SOLN: 50 % | INTRAVENOUS | Qty: 50

## 2015-06-25 MED FILL — ZOSYN 4-0.5 GM/100ML IV SOLN: INTRAVENOUS | Qty: 100

## 2015-06-25 MED FILL — HYDROMORPHONE HCL 1 MG/ML IJ SOLN: 1 MG/ML | INTRAMUSCULAR | Qty: 1

## 2015-06-25 MED FILL — SODIUM CHLORIDE 0.9 % IV SOLN: 0.9 % | INTRAVENOUS | Qty: 1000

## 2015-06-25 MED FILL — CALCIUM GLUCONATE 10 % IV SOLN: 10 % | INTRAVENOUS | Qty: 10

## 2015-06-25 MED FILL — HUMULIN R 100 UNIT/ML IJ SOLN: 100 UNIT/ML | INTRAMUSCULAR | Qty: 3

## 2015-06-25 MED FILL — HYDROMORPHONE HCL 1 MG/ML IJ SOLN: 1 MG/ML | INTRAMUSCULAR | Qty: 0.5

## 2015-06-25 MED FILL — FAMOTIDINE 20 MG/2ML IV SOLN: 20 MG/2ML | INTRAVENOUS | Qty: 2

## 2015-06-25 MED FILL — LOVENOX 30 MG/0.3ML SC SOLN: 30 MG/0.3ML | SUBCUTANEOUS | Qty: 0.3

## 2015-06-25 MED FILL — FLUCONAZOLE IN SODIUM CHLORIDE 200-0.9 MG/100ML-% IV SOLN: INTRAVENOUS | Qty: 100

## 2015-06-25 MED FILL — ZOSYN 3-0.375 GM/50ML IV SOLN: INTRAVENOUS | Qty: 50

## 2015-06-25 MED FILL — METRONIDAZOLE IN NACL 5-0.79 MG/ML-% IV SOLN: 5 mg/mL | INTRAVENOUS | Qty: 100

## 2015-06-25 MED FILL — ONDANSETRON HCL 4 MG/2ML IJ SOLN: 4 MG/2ML | INTRAMUSCULAR | Qty: 2

## 2015-06-25 MED FILL — FENTANYL 25 MCG/HR TD PT72: 25 MCG/HR | TRANSDERMAL | Qty: 1

## 2015-06-25 MED FILL — NORMAL SALINE FLUSH 0.9 % IV SOLN: 0.9 % | INTRAVENOUS | Qty: 60

## 2015-06-25 MED FILL — VANCOMYCIN HCL IN DEXTROSE 500-5 MG/100ML-% IV SOLN: 500-5 MG/100ML-% | INTRAVENOUS | Qty: 100

## 2015-06-25 MED FILL — SODIUM BICARBONATE 8.4 % IV SOLN: 8.4 % | INTRAVENOUS | Qty: 50

## 2015-06-25 MED FILL — SODIUM CHLORIDE 0.9 % IV SOLN: 0.9 % | INTRAVENOUS | Qty: 2000

## 2015-06-25 MED FILL — VANCOMYCIN HCL 1000 MG IV SOLR: 1000 MG | INTRAVENOUS | Qty: 750

## 2015-06-25 NOTE — ED Provider Notes (Signed)
[facility] ED PROVIDER NOTE    Patient Identification  Pt Name: Erica Harding  MRN: [id number]  [place] January 30, 1971  Date of evaluation: 06/25/2015  Provider: Martin Majestic, MD  PCP: Referring Not In System (Inactive)    Chief Complaint  Fatigue (Patient's sister brought patient to [place] to live with her after recovering from colon and rectal cancer. Since arriving to  on 06/15/15 patient hasn't been eating, has had increased confusion, and weakness. )      HPI  (History provided by patient and sister)  This is a 45 y.o. female with a PMH of colorectal cancer who was brought in by EMS transportation with the chief complaint of weakness.     History is limited, as the patient is altered on arrival to the ED.  Patient's sister states that the patient recently had sepsis and a large abdominal surgery and has multiple drains and open wounds in her abdomen now.  Patient came to live with her sister in , as this patient's sister is a Merchandiser, retail, , but the patient has been steadily declining since getting here on 06/15/15.  Patient has had poor p.o. Intake, and increasing confusion.  Patient is also just weak all over.  Patient complains of significant pain in her abdomen.  Sister Notes the wound has been swelling worse and worse, and that she has purulent drainage, as well as urine and stool leaking from her wounds.    ROS  Unable to obtain due to AMS      I have reviewed the following nursing documentation:  Allergies: Latex and Compazine [prochlorperazine maleate]    Past medical history:   Past Medical History   Diagnosis Date   ??? Colorectal cancer [facility])      Past surgical history:   Past Surgical History   Procedure Laterality Date   ??? Colonoscopy     ??? Abdomen surgery     ??? Hysterectomy (cervix not removed)         Home medications:   Previous Medications    GABAPENTIN (NEURONTIN) 300 MG CAPSULE    Take 300 mg by mouth 2 times daily    MEGESTROL ACETATE 800 MG/20ML SUSP    Take by  mouth    MORPHINE (KADIAN) 30 MG EXTENDED RELEASE CAPSULE    Take 30 mg by mouth daily .    OXYCODONE HCL (OXY-IR) 10 MG IMMEDIATE RELEASE TABLET    Take 10 mg by mouth every 4 hours as needed for Pain .    TAMSULOSIN (FLOMAX) 0.4 MG CAPSULE    Take 0.4 mg by mouth daily       Social history:  reports that she has never smoked. She does not have any smokeless tobacco history on file. She reports that she does not drink alcohol or use illicit drugs.    Family history:  History reviewed. No pertinent family history.    Exam  ED Triage Vitals   BP Temp Temp Source Pulse Resp SpO2 Height Weight   06/25/15 0541 06/25/15 0541 06/25/15 0541 06/25/15 0541 06/25/15 0541 06/25/15 0541 06/25/15 0529 06/25/15 0529   103/75 97.1 ??F (36.2 ??C) Oral 118 16 100 % 5\' 2"  (1.575 m) 90 lb (40.8 kg)     Vital signs and nursing notes reviewed.  General: cachetic, frail-appearing female lying in bed and appears uncomfortable  HEENT: Normocephalic, atraumatic, temporal wasting, PERRL, dry mucous membranes, posterior oropharynx without erythema or exudate  Neck: supple, trachea midline  CV:  tachycardic but regular rate and rhythm, normal S1/S2,  intact distal pulses, no peripheral edema  Pulm: lungs clear to auscultation bilaterally without wheezes, rales, rhonchi.  Abd: there is a large open wound in the middle of the patient's abdomen that smells gangrenous.  There is stool and urine leaking from this wound, as well as purulent drainage.  Patient also has multiple drains in her abdomen.  MSK: normal range of motion, muscle wasting  Neuro: drowsy, oriented to person, [place], but not to date or current events.      Procedures: N/A        Radiology  CT ABDOMEN PELVIS WO IV CONTRAST Additional Contrast? None   Final Result   1. Moderate amount of complex intraperitoneal and extraperitoneal fluid with   multiple locules of air concerning for an infectious process including   possible anastomotic leak in the absence of recent surgery,  limited by the   lack of intravenous and enteric contrast.         CT Head WO Contrast   Final Result   No acute intracranial abnormality.         XR Chest Portable   Final Result   The lungs are hyperinflated, but there is no acute finding in the chest.             Labs  Results for orders placed or performed during the hospital encounter of 06/25/15   CBC Auto Differential   Result Value Ref Range    WBC 55.6 (HH) 4.0 - 11.0 K/uL    RBC 3.71 (L) 4.00 - 5.20 M/uL    Hemoglobin 9.4 (L) 12.0 - 16.0 g/dL    Hematocrit 31.6 (L) 36.0 - 48.0 %    MCV 85.2 80.0 - 100.0 fL    MCH 25.3 (L) 26.0 - 34.0 pg    MCHC 29.7 (L) 31.0 - 36.0 g/dL    RDW 23.3 (H) 12.4 - 15.4 %    Platelets 187 135 - 450 K/uL    MPV 7.7 5.0 - 10.5 fL    Neutrophils % 75.0 %    Lymphocytes Relative 2.0 %    Monocytes % 4.0 %    Eosinophils Relative Percent 0.0 %    Basophils % 0.0 %    Neutrophils # 52.3 (H) 1.7 - 7.7 K/uL    Lymphocytes # 1.1 1.0 - 5.1 K/uL    Monocytes # 2.2 (H) 0.0 - 1.3 K/uL    Eosinophils # 0.0 0.0 - 0.6 K/uL    Basophils # 0.0 0.0 - 0.2 K/uL    Bands Relative 18 (H) 0 - 7 %    Metamyelocytes Relative 1 (A) %    Toxic Granulation Present (A)     Macrocytes 2+ (A)     Microcytes Occasional (A)     Polychromasia 1+ (A)     Hypochromia 2+ (A)     Poikilocytes Occasional (A)    Comprehensive Metabolic Panel   Result Value Ref Range    Sodium 131 (L) 136 - 145 mmol/L    Potassium 6.1 (HH) 3.5 - 5.1 mmol/L    Chloride 93 (L) 99 - 110 mmol/L    CO2 20 (L) 21 - 32 mmol/L    Anion Gap 18 (H) 3 - 16    Glucose 99 70 - 99 mg/dL    BUN 50 (H) 7 - 20 mg/dL    CREATININE 1.5 (H) 0.6 - 1.1 mg/dL    GFR Non-African American 38 (A) >  60    GFR African American 46 (A) >60    Calcium 7.3 (L) 8.3 - 10.6 mg/dL    Total Protein 5.7 (L) 6.4 - 8.2 g/dL    Alb 1.7 (L) 3.4 - 5.0 g/dL    Albumin/Globulin Ratio 0.4 (L) 1.1 - 2.2    Total Bilirubin 0.7 0.0 - 1.0 mg/dL    Alkaline Phosphatase 151 (H) 40 - 129 U/L    ALT 30 10 - 40 U/L    AST 20 15 - 37 U/L     Globulin 4.0 g/dL   Lipase   Result Value Ref Range    Lipase 11.0 (L) 13.0 - 60.0 U/L   Lactic Acid, Plasma   Result Value Ref Range    Lactic Acid 2.2 (H) 0.4 - 2.0 mmol/L   Blood gas, arterial   Result Value Ref Range    O2 Therapy Unknown        Screenings           MDM and ED Course  Vitals:    06/25/15 0529 06/25/15 0541 06/25/15 0601   BP:  103/75 112/77   Pulse:  118    Resp:  16    Temp:  97.1 ??F (36.2 ??C)    TempSrc:  Oral    SpO2:  100% 100%   Weight: 90 lb (40.8 kg)     Height: 5\' 2"  (1.575 m)         45 year old female presents to the ED with weakness in the setting of an open abdominal wound and colon cancer.  Physical exam reveals a frail, cachectic appearing female with an open abdominal wound that smells gangrenous.  Immediately upon my evaluation, IV fluids were ordered, greater than 30 mL/kg bolus, as well as broad-spectrum antibiotics including vancomycin, Zosyn, and Flagyl.    I did discuss the patient's code status with the patient's sister at bedside.  As the patient is not alert and oriented, she cannot participate in the discussion.  Sister Notes the patient would not want to be resuscitated and really wishes just to keep her comfortable.  Patient had initially declined hospice, but the patient's sister is hoping that during this admission she can be placed into hospice.  The patient's sister wishes ultimately to take her home.  However, pt's mother would be her DPOA and we have not been able to reach her yet.  Pt does not have a living will.     Labs are notable for leukocytosis of 55.6, lactic acidosis, and acute kidney injury, hyperkalemia.  CT head shows no acute pathology.  CT abdomen pelvis shows multiple fluid collections and locules of air concerning for infection.  Blood culture pending.    Patient was given IV antibiotics, IV fluids, Zofran, and Dilaudid for symptom control.  Patient was given calcium, insulin, glucose, and bicarb for her hyperkalemia, although I think most of this  is due to her dehydration.  Fluconazole was also added onto the patient's antibiotic regimen.  Patient did become more sleepy with the Dilaudid, however she continues to awaken and although she is confused, she is maintaining oxygen saturations of 100% on 2 L of oxygen and is protecting her airway.  I do not feel she requires intubation at this time.  An ABG was added on at the intensivist request.    Patient will be admitted for further treatment of her intra-abdominal sepsis.  Patient discussed with the intensivist who accepted patient for admission.  Patient admitted in critical  condition.      Final Impression  1. Cachexia (HCC)    2. Leukocytosis, unspecified type    3. Postprocedural intra-abdominal sepsis, initial encounter ([place])    4. Dehydration    5. Open wnd ant abdomen-comp, initial encounter    6. Lactic acidosis    7. Hyperkalemia    8. Hypocalcemia    9. Encephalopathies    10. AKI (acute kidney injury) ([place])          Disposition:  DISPOSITION Admitted      The total Critical Care time caring for this patient with a life threatening illness is 35 minutes.  This includes frequent reassessments, review of data including imaging and laboratory studies, and discussions with other team members and physicians, and excludes separately billable procedures.       This chart was generated using the [employer] dictation system. I created this record but it may contain dictation errors given the limitations of this technology.       Martin Majestic, MD  06/25/15 0800

## 2015-06-25 NOTE — Progress Notes (Signed)
Pt seen by wound care nurse Thereasa Solo.  Abdominal incisions assessed and cleaned with saline, wet to wet dressings applied.  Pt colostomy bag changed.  Pt nephrostomy tubes assessed, coccyx wound assessed and new dressing applied.  Pt with unstageable wound to right lateral thigh assessed.  See wound documentation.  Pt pre medicated with Dilaudid 0.5 mg ivp prior to treatment.  Pt tolerated poorly.  Currently resting quietly appears to be asleep.  VS

## 2015-06-25 NOTE — Plan of Care (Signed)
Problem: Risk for Impaired Skin Integrity  Goal: Tissue integrity - skin and mucous membranes  Structural intactness and normal physiological function of skin and  mucous membranes.      - foam dressing to pressure ulcers  -moist saline dressing to abd wounds-dry dressing to LLQ ?fistula/old drain site  -routine ostomy care  - will attempt to get wound manager sample from [place]. Should be here tomorrow morning. Will plan to pouch over both abd wounds.

## 2015-06-25 NOTE — Plan of Care (Signed)
Problem: Risk for Impaired Skin Integrity  Goal: Tissue integrity - skin and mucous membranes  Structural intactness and normal physiological function of skin and  mucous membranes.   Outcome: Ongoing  At risk for skin breakdown. See Braden scale. Remains on bedrest. Patient's skin assessed. Pt with multiple areas of open wounds, stage 2 to pt coccyx and unstagable wound to right lateral thigh.  Pt ween and evaluated by wound care nurse see documentation. Turned and repositioned every two hours and as needed using LIFT equipment. Heels elevated off bed with bilateral heel elevators. Protective barrier placed as needed. Patient kept clean and dry. Pillows used for positioning. Will continue to monitor for skin breakdown        Problem: Falls - Risk of  Goal: Absence of falls  Outcome: Ongoing  Patient placed on fall Precautions per Leamon Arnt Fall Risk Assessment Scale (Score> 45).  Fall risk armband on, orange blanket placed on foot of bed, S.A.F.E. Sign displayed at door.  Bed in locked and low position, bed alarm armed and audible, call light and bedside table in reach.  Patient acknowledges the need to call before getting out of bed. Q2 monitoring, and toileting performed.     Problem: Infection, Septic Shock:  Goal: Will show no infection signs and symptoms  Will show no infection signs and symptoms   Outcome: Ongoing  Pt admitted with elevated lactic acid and WBC pt with history of colon CA, tumor removed then pt received radiation.  Pt with post procedure complications from radiation.    Pt receiving IV antibiotics.  Continue with plan of care

## 2015-06-25 NOTE — Progress Notes (Signed)
Pt assessment complete, see flowsheets for details. VSS. Pt A&O x4. Pt c/o abdominal pain 10/10. Spoke to hospitalist regarding Fentanyl patch in conjunction with PRN Dilaudid. MD encouraged to administer both. PRN Dilaudid 1mg  given. Pain now 8/10. Abdominal dressings C/D/I. Stoma pink and moist, no stool noted in colostomy bag at this time. Bowel sounds hypoactive. Left and right nephrostomy tubes in place and draining. Lungs clear and diminished in bases. Fentanyl patch applied to L upper back. Pt tearful and appears upset- states "no one will listen to me." RN attempted to console pt and spent 10 minutes educating pt. Pt now appears more comfortable and calm. Reposition declined by pt. Will monitor. Lenard Forth

## 2015-06-25 NOTE — ED Notes (Signed)
Call placed to patient mother who is reported to be patient POA. Dr. Roanna Raider speaking with patient mother at this time.      Emerson Monte, RN  06/25/15 [phone]

## 2015-06-25 NOTE — Consults (Signed)
[facility]  ICU Note    Subjective:   CC / HPI: sepsis, intraabdominal abscess  Patient is 45 yo female who was admitted through ED with abdominal pain and altered mental status  She has very complex medical hx and just moved from [place] brought by her sister  She did have recent abdominal surgery last month and she was discharged home  She has prior hx of colorectal ca treated in the past with radiation  She has renal failure and sepsis on admission  She was seen by general surgery on admission    PMH: Reviewed  Past Medical History   Diagnosis Date   ??? Colorectal cancer ([place])          PSH: Reviewed  Past Surgical History   Procedure Laterality Date   ??? Colonoscopy     ??? Abdomen surgery     ??? Hysterectomy (cervix not removed)           Review of Systems  Constitutional: Negative for fever, chills, diaphoresis.  Respiratory: See HPI  Cardiovascular: Negative for chest pain, palpitations and leg swelling.   Gastrointestinal: positive for abdominal pain   Genitourinary: Negative for dysuria, urgency, frequency, hematuria, decreased urine volume, and enuresis.   Skin: Negative.  Negative for color change and pallor.   Neurological: No dizziness, lightheaded, loss of consciousness, HA or neck stiffness     Objective:   Data Review:  Vital Signs:   Visit Vitals   ??? BP 102/66   ??? Pulse 112   ??? Temp 97.1 ??F (36.2 ??C) (Oral)   ??? Resp 14   ??? Ht 5\' 2"  (1.575 m)   ??? Wt 90 lb (40.8 kg)   ??? SpO2 97%   ??? BMI 16.46 kg/m2       24 Hour I&O Review: No intake or output data in the 24 hours ending 06/25/15 1101  Continuous Infusions:   Current Medications: Current Facility-Administered Medications: HYDROmorphone (DILAUDID) injection 0.5 mg, 0.5 mg, Intravenous, Q30 Min PRN  metronidazole (FLAGYL) 500 mg in NaCl 100 mL IVPB premix, 500 mg, Intravenous, Once  sodium chloride flush 0.9 % injection 10 mL, 10 mL, Intravenous, 2 times per day  sodium chloride flush 0.9 % injection 10 mL, 10 mL, Intravenous, PRN  potassium  chloride 10 mEq/100 mL IVPB (Peripheral Line), 10 mEq, Intravenous, PRN  potassium chloride 20 mEq/50 mL IVPB (Central Line), 20 mEq, Intravenous, PRN  magnesium sulfate 1 g in dextrose 5% 100 mL IVPB premix, 1 g, Intravenous, PRN  acetaminophen (TYLENOL) tablet 650 mg, 650 mg, Oral, Q4H PRN  ondansetron (ZOFRAN) injection 4 mg, 4 mg, Intravenous, Q6H PRN  famotidine (PEPCID) injection 20 mg, 20 mg, Intravenous, Daily  enoxaparin (LOVENOX) injection 30 mg, 30 mg, Subcutaneous, Daily  ipratropium-albuterol (DUONEB) nebulizer solution 1 ampule, 1 ampule, Inhalation, 4x daily  calcium gluconate 1 g in dextrose 5 % 100 mL IVPB, 1 g, Intravenous, Once    CBC:  Recent Labs      06/25/15   0705   WBC  55.6*   RBC  3.71*   HGB  9.4*   HCT  31.6*   PLT  187   MCV  85.2   MCH  25.3*   MCHC  29.7*   RDW  23.3*   BANDSPCT  18*      BMP:  Recent Labs      06/25/15   0705   NA  131*   K  6.1*  CL  93*   CO2  20*   BUN  50*   CREATININE  1.5*   CALCIUM  7.3*   GLUCOSE  99        Radiology Review:  Pertinent images / reports were reviewed as a part of this visit.     Physical Exam   Constitutional: Awake but confused  HENT: No oropharyngeal exudate.   Eyes: Pupils are equal, round, and reactive to light.   Neck: No JVD present. No tracheal deviation present.   Cardiovascular: regular rhythm, S1&S2 and intact distal pulses.    Pulmonary/Chest: bilateral breath sounds. No wheezes, rhonchi, rales or tenderness.   Abdominal: previous abdominal wounds noted with exposure of intra abdominal fluids   Musculoskeletal: No edema and no tenderness.   Neurological: Non focal.    Assessment:     1. Intra abdominal infection with abscess  2. Sepsis 2/2 above  3. AKI  4. Hx of colorectal ca  Plan:     VS reviewed as above. Stress ulcer, DVT, and line protocols are being followed if not contraindicated.  Seen by surgical team and plan for supportive care now with repeat CT abdomin planned for later today  Continue IVF  Continue abx  Pain  control  Spoke to sister at bedside  Discussed with ICU team      Total critical care time caring for this patient with life threatening illness, including direct patient contact, management of life support systems, review of data including imaging and labs, discussions with other team members and physicians is at least 33 minutes so far today, excluding procedures.  ??  ??

## 2015-06-25 NOTE — Progress Notes (Signed)
Updated pt mother on inability to place PICC line.

## 2015-06-25 NOTE — ED Notes (Signed)
Patient abdominal dressings removed and evaluated per Dr. Roanna Raider and Magda Paganini surgical NP. Patient wounds left open to air with "chucks" pad covering and towels per MD Roanna Raider. Verbal order for dilaudid received from Dr. Roanna Raider and placed. Aware of current vitals including Blood pressure and ok with administration at this time. Will continue to monitor. Per MD Roanna Raider to complete antibiotic regimen prior to calcium administration.      Emerson Monte, RN  06/25/15 [phone]

## 2015-06-25 NOTE — H&P (Addendum)
[facility] HISTORY AND PHYSICAL      06/25/2015 8:54 AM    Patient Information:  Erica Harding is a 45 y.o. female [id number]  PCP:  Referring Not In System (Inactive) (Tel: None )    Chief complaint:    Chief Complaint   Patient presents with   ??? Fatigue     Patient's sister brought patient to [place] to live with her after recovering from colon and rectal cancer. Since arriving to [place] on 06/15/15 patient hasn't been eating, has had increased confusion, and weakness.         History of Present Illness:  Erica Harding is a 45 y.o. female who presented with feeling weak and debilitated. Noted to have large abdominal wound with fecal content coming out of it. Pt is confused and not able to provide any hx. All her care has been in [place].    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 She recently presented several weeks prior to admission with recurrent presacral abscess that was treated with IR drainage x2 of both the presacral space and her left abdominal wall. She was ultimately discharged home and was readmitted on 05/31/2015 when she presented with a white count of 38,000 and a CT scan showing interval enlargement of a complex abscess in her presacral space that extended into her abdominal wall and left flank. She was taken to the OR on 05/31/2015 with Oncology Surgery and Urology for incision and drainage of complex abdominal wall abscess, rectal EUA, cystourethroscopy, bilateral retrograde pyelograms and removal old stent and  placement of left 6x24 firm stent. She tolerated the procedure well without intraoperative complications. Her postoperative course was complicated by difficult pain control, patient refusal of dressing changes combined with inadequately packed wounds and patient refusal of lovenox. Her pain regimen was modified several times during admission to accomodate patient's desire for increased pain control. Eventually, dressing changes were tolerated by the patient by specific staff members. During intraoperative pyelogram, contrast was noted to be leaking from the right ureter into the pelvic fluid collection. On recommendations of Urology, patient went for right ureter embolization, right nephrostomy tube exchange, left nephrostomy tube placement and pelvic drain exchange on 06/06/2015. The rest of the patient's hospital stay was unremarkable. She was tolerating a regular diet, ambulating appropriately, tolerating dressing changes, having adequate bowel function and urine output.    Plan for discharge was discussed with the patient and she was told to return to the emergency department with any of the following symptoms: fever > 101.5, redness, warmth, or purulent drainage from skin wound, inability to control pain with prescribed pain medication, inability to tolerate liquids, intractable nausea and/or vomiting, or with any additional concerns.    Patient was agreeable with plant of discharge and will follow up with Surgical Oncology, Urology and Interventional Radiology within the next month.'        Patient unable to provide history/review of system due to mental status     REVIEW OF SYSTEMS: unable to obtain     Past Medical History:   has a past medical history of Colorectal cancer ([place]).  Past Surgical History:   has a past surgical history that includes Colonoscopy; Abdomen surgery; and Hysterectomy.     Medications:  No current facility-administered medications on file prior to encounter.      No current  outpatient prescriptions on file prior to encounter.       Allergies:  Allergies   Allergen Reactions   ??? Latex Itching   ??? Compazine [Prochlorperazine Maleate] Swelling        Social History:   reports that she has never smoked. She does not have any smokeless tobacco history on file. She reports that she does not drink alcohol or use illicit drugs.     Family History:  family history is not on file.     Physical Exam:  Visit Vitals   ??? BP (!) 98/56   ??? Pulse 112   ??? Temp 97.1 ??F (36.2 ??C) (Oral)   ??? Resp 16   ??? Ht 5\' 2"  (1.575 m)   ??? Wt 90 lb (40.8 kg)   ??? SpO2 100%   ??? BMI 16.46 kg/m2       General appearance:  Appears severe distress   Eyes: Sclera clear, pupils equal  ENT: Moist mucus membranes, no thrush. Trachea midline.  Cardiovascular: Regular rhythm, normal S1, S2. No murmur, gallop, rub. No edema in lower extremities  Respiratory: Clear to auscultation bilaterally, no wheeze, good inspiratory effort  Gastrointestinal: multiple open abdominal wounds with fecal material drainage   Musculoskeletal: No cyanosis in digits, neck supple  Neurology: Cranial nerves grossly intact. Alert and oriented in time, place and person. No speech or motor deficits  Psychiatry: Appropriate affect. Not agitated  Skin: Warm, dry, normal turgor, no rash    Labs:  CBC:   Lab Results   Component Value Date    WBC 55.6 06/25/2015    RBC 3.71 06/25/2015    HGB 9.4 06/25/2015    HCT 31.6 06/25/2015    MCV 85.2 06/25/2015    MCH 25.3 06/25/2015    MCHC 29.7 06/25/2015    RDW 23.3 06/25/2015    PLT 187 06/25/2015    MPV 7.7 06/25/2015     BMP:    Lab Results   Component Value Date    NA 131 06/25/2015    K 6.1 06/25/2015    CL 93 06/25/2015    CO2 20 06/25/2015    BUN 50 06/25/2015    CREATININE 1.5 06/25/2015    CALCIUM 7.3 06/25/2015    GFRAA 46 06/25/2015    LABGLOM 38 06/25/2015    GLUCOSE 99 06/25/2015         Discussed POC at length with ED physician and Surgeon      Problem List  Severe sepsis       Assessment/Plan:     Severe  sepsis 2/2 peritonitis   Severe metabolic acidosis   Multiple open abdominal wounds   AKI  Hyperkalemia   Severe abdominal pain   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   Treat hyperkalemia medically   Palliative care consult -- pt is hospice appropriate however family and pt seem oblivious to poor prognosis   Pain control  w fentanyl patch and IV dilaudid       Diet:   NPO  Prophylaxis:   DVT: SCD's   GI: Pepcid   Code:FULL   Disposition: ICU          Total critical care time was 42 minutes, excluding separately reportable procedures. Services included in critical care time were chart data review, documentation time, obtaining information from patient, review of nursing notes, and vital sign assessment. There was a high probability of clinically significant life-threatening deterioration in the patient's condition, which required my urgent intervention.      Admit as inpatient. I anticipate hospitalization spanning at least two midnights for investigation and treatment of the above medically necessary diagnoses.    Brigitte Pulse, MD   06/25/2015 8:54 AM

## 2015-06-25 NOTE — ED Notes (Signed)
Patient noted to have stage 2 pressure ulcer to coccyx. mepilex applied.      Emerson Monte, RN  06/25/15 1005

## 2015-06-25 NOTE — Progress Notes (Signed)
Lollie Marrow RN is checking patient's renal status and will call back.

## 2015-06-25 NOTE — ED Notes (Signed)
Spoke with Dr. Lenoard Aden regarding foley catheter placement. Prior to patient sister leaving she mentioned no foley. Ok per [place] to hold on foley until sister returns for confirmation. Attempted to palpate for ABG. Respiratory called.      Emerson Monte, RN  06/25/15 [phone]

## 2015-06-25 NOTE — ED Notes (Signed)
Bed: 09-01  Expected date:   Expected time:   Means of arrival:   Comments:  squad     Liliana Cline  06/25/15 0518

## 2015-06-25 NOTE — Plan of Care (Signed)
Problem: Nutrition  Goal: Optimal nutrition therapy  Outcome: Ongoing  Nutrition Problem: Inadequate oral intake  Intervention: Food and/or Nutrient Delivery: Continue NPO, Start Parenteral Nutrition  Nutritional Goals: Pt will tolerate TPN at goal rate

## 2015-06-25 NOTE — Progress Notes (Signed)
Pt arrived from ED pt disoriented x4, pt experiencing increase pain with movement and nursing care.  Pt with multiple open incisions to abdomen and back, drains draining yellowish fluid, pt linen and gown saturated with drainage, warm blankets provided after linen change.  Pt VS afebrile.  IV vancomycin infusing on arrival to ICU.  Pt sister arrived to speak with Dr. Jeb Levering and Dr Roanna Raider.  Pt history and story with increase confusion and loose ends.  Sister states pt does not have LW or DPA.  Explained mother would need to be decision maker.  See pt flow sheets.

## 2015-06-25 NOTE — ED Notes (Signed)
Patient transferred to ICU with RN and tech. Medications continued on transfer. Patient transferred on "lifepack"     Emerson Monte, RN  06/25/15 1217

## 2015-06-25 NOTE — Progress Notes (Signed)
Pt mother Jahniah Haidar contacted at [phone] for consent for PICC LINE PLACEMENT.  Updated pt mother on status and pt tolerance.

## 2015-06-25 NOTE — Consults (Signed)
Nutrition Assessment    Type and Reason for Visit: Consult (wounds)    Nutrition Recommendations:   1. If TPN is desired, recommend Clinimix 5/20 at initial goal rate 50 mL per hour to provide 1200 mL total volume, 1056 calories, 60 grams protein, dextrose load 4.49 mg/kg/min.   2. Recommend check TG, Mg, Phos, CMP now if not done in last 24 hours.  3. Physician/LIP to monitor closely and correct lytes (Phos,Mg,K+) d/t risk of refeeding syndrome. D/w PharmD, plan to start at 10 mL/hr.   4. Recommend 250 mL 20% lipids two times per week 3/20 if TG WNL  5. Recommend FSBS, monitor glucose, need for insulin  6. Pharmacy to adjust MVI and Trace Elements as needed     Malnutrition Assessment:  ?? Malnutrition Status: Meets the criteria for severe malnutrition  ?? Context: Chronic illness  ?? Findings of the 6 clinical characteristics of malnutrition (Minimum of 2 out of 6 clinical characteristics is required to make the diagnosis of moderate or severe Protein Calorie Malnutrition based on AND/ASPEN Guidelines):  1. Energy Intake-Not available,      2. Weight Loss-10% loss or greater, in 1 month  3. Fat Loss-Moderate subcutaneous fat loss, Triceps  4. Muscle Loss-Moderate muscle mass loss, Clavicles (pectoralis and deltoids), Thigh (quadriceps)  5. Fluid Accumulation-Unable to assess,    6. Grip Strength-Not measured    Nutrition Diagnosis:   ?? Problem: Inadequate oral intake  ?? Etiology: related to Insufficient energy/nutrient consumption    ??? Signs and symptoms:  as evidenced by NPO status due to medical condition    Nutrition Assessment:  ?? Subjective Assessment: Pt confused and unable to provide hx. Pt just moved here from [place]. Per chart review, pt had surgery 6 years ago for colorectal cancer, had chemo and radiation and developed multiple fistulas. Pt has had multiple surgeries since. General surgery following; recommending TPN and bowel rest. Per care everywhere, pt weighed 100 lbs on 05/31/2015. CBW 81  lbs. Possible 19 lb (19%) weight loss x 1 month. Pt at high risk for refeeding syndrome.   ?? Nutrition-Focused Physical Findings: Refer to malnutrition assessment   ?? Wound Type: Stage II, Surgical Wound, Pressure Ulcer, Unstageable  ?? Current Nutrition Therapies:  ?? Oral Diet Orders: NPO   ?? Oral Diet intake: NPO  ?? Oral Nutrition Supplement (ONS) Orders: None  ?? ONS intake: NPO  ?? Anthropometric Measures:  ?? Ht: 5\' 2"  (157.5 cm)   ?? Current Body Wt: 81 lb (36.7 kg)  ?? Admission Body Wt: 81 lb (36.7 kg)  ?? Ideal Body Wt: 110 lb (49.9 kg)   ?? BMI Classification: BMI <18.5 Underweight  ?? Comparative Standards (Estimated Nutrition Needs):  ?? Estimated Daily Total Kcal: [phone]   ?? Estimated Daily Protein (g): 44-74 grams     Estimated Intake vs Estimated Needs: Intake Less Than Needs    Nutrition Risk Level: High    Nutrition Interventions:   Continue NPO, Start Parenteral Nutrition  Continued Inpatient Monitoring, Education not appropriate at this time    Nutrition Evaluation:   ?? Evaluation: Goals set   ?? Goals: Pt will tolerate TPN at goal rate     ?? Monitoring: NPO Status, PN Intake, PN Tolerance, Skin Integrity, Wound Healing, Mental Status/Confusion, Weight, Comparative Standards, Pertinent Labs    See Adult Nutrition Doc Flowsheet for more detail.     Electronically signed by Greer Ee, RD, LD on 06/25/15 at 2:33 PM    Contact Number:  [phone]

## 2015-06-25 NOTE — Progress Notes (Signed)
Lab tech unsuccessful at drawing complete lab ordered.  Dr. Roanna Raider updated ABG ordered for lactac Erica Harding stuck x2 unsuccessful.  Erica Harding tolerated poorly.

## 2015-06-25 NOTE — ED Notes (Signed)
Report called to ICU RN states needs bed in room prior to able to transfer.      Emerson Monte, RN  06/25/15 [phone]

## 2015-06-25 NOTE — ED Notes (Signed)
Patient back from CT scan.      Nichola Sizer, RN  06/25/15 [phone]

## 2015-06-25 NOTE — ED Notes (Addendum)
Patient brought in via squad from home for c/o AMS. Sister reports patient was living in [place], was dx, treated, and is a survivor of colorectal cancer. Sister reports she went to get patient from [place] so that she can care for her because the mother couldn't. Sister reports patient was recently hospitalized d/t sepsis. Patient has abd incision and various abd drains. Sister is unsure what was all done with patient. Patient is altered and is poor historian. Malodorous smell coming from abdominal drains and incisions and what it looks like stool draining from abd incision. Port accessed but unable to get blood return from port. Sister said when patient was hospitalized they were having problems getting blood from port. Lab was called to draw blood, lab tech currently waiting for patient to return from CT scan. IV NS infusing, patient medicated per [facility], patient placed on monitor with b/p cuff set to cycle q15 minutes, continuous pulse ox, RR even and unlabored, skin warm and dry to touch, sister at bedside, will continue to monitor.      Nichola Sizer, RN  06/25/15 [place] Shakema Surita, RN  06/25/15 [phone]

## 2015-06-25 NOTE — Care Coordination-Inpatient (Signed)
[facility] Continence Nurse  Consult Note       NAME:  Erica Harding  MEDICAL RECORD NUMBER:  [id number]  AGE: 45 y.o.   GENDER: female  DOB: Nov 15, 1970  TODAY'S DATE:  06/25/2015    Subjective   Reason for WOCN Evaluation and Assessment: multiple wounds, pressure and surgical      Erica Harding is a 45 y.o. female referred by:   []  Physician  [x]  Nursing  []  Other:     Wound Identification:  Wound Type: pressure and non-healing surgical  Contributing Factors: chronic pressure, decreased mobility and shear force    Wound History: pt has hx of colon cancer, apparently had radiation and per report that is why she has wounds/fistula  Current Wound Care Treatment:  Open to air, pt reprots that she just cleans wounds. However she is confused and disoriented at times.    Patient Goal of Care:  [x]  Wound Healing  []  Odor Control  []  Palliative Care  []  Pain Control   []  Other:         PAST MEDICAL HISTORY        Diagnosis Date   ??? Colorectal cancer ([place])        PAST SURGICAL HISTORY    Past Surgical History   Procedure Laterality Date   ??? Colonoscopy     ??? Abdomen surgery     ??? Hysterectomy (cervix not removed)         FAMILY HISTORY    History reviewed. No pertinent family history.    SOCIAL HISTORY    Social History   Substance Use Topics   ??? Smoking status: Never Smoker   ??? Smokeless tobacco: None   ??? Alcohol use No       ALLERGIES    Allergies   Allergen Reactions   ??? Latex Itching   ??? Compazine [Prochlorperazine Maleate] Swelling       MEDICATIONS    No current facility-administered medications on file prior to encounter.      No current outpatient prescriptions on file prior to encounter.       Objective      Visit Vitals   ??? BP (!) 109/91   ??? Pulse 111   ??? Temp 98.7 ??F (37.1 ??C) (Temporal)   ??? Resp 19   ??? Ht 5\' 2"  (1.575 m)   ??? Wt 81 lb 12.7 oz (37.1 kg)   ??? SpO2 97%   ??? BMI 14.96 kg/m2       LABS:  WBC:    Lab Results   Component Value Date    WBC 55.6 06/25/2015     H/H:    Lab Results   Component Value  Date    HGB 9.4 06/25/2015    HCT 31.6 06/25/2015     PTT:    Lab Results   Component Value Date    APTT 49.9 06/25/2015   [APTT}  PT/INR:    Lab Results   Component Value Date    PROTIME 17.6 06/25/2015    INR 1.56 06/25/2015     HgBA1c:  No results found for: LABA1C    Assessment   Braden Risk Score: Braden Scale Score: 10    Patient Active Problem List   Diagnosis Code   ??? AKI (acute kidney injury) ([place]) N17.9   ??? Hyperkalemia E87.5   ??? Open wnd ant abdomen-comp S31.109A   ??? Postprocedural intra-abdominal sepsis (HCC) T81.4XXA, A41.9   ??? 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  Measurements:        Pressure Ulcer 06/25/15 Sacrum Left (Active)   Peri-wound Assessment Clean;Dry 06/25/2015  1:15 PM   Pressure Ulcer Staging Stage II 06/25/2015  1:15 PM   Assessment Drainage 06/25/2015  1:15 PM   Shape irreg x2; see note 06/25/2015  1:15 PM   Dressing Changed Changed/New 06/25/2015  1:15 PM   Dressing/Treatment Foam 06/25/2015  1:15 PM   Wound Cleansed Rinsed/Irrigated with saline 06/25/2015  1:15 PM   Dressing Change Due 06/28/15 06/25/2015  1:15 PM   Drainage Amount None 06/25/2015  1:15 PM   Odor None 06/25/2015  1:15 PM   Pink%Wound Bed 100 06/25/2015  1:15 PM   Margins Defined edges 06/25/2015  1:15 PM   Number of days:0           Pressure Ulcer 06/25/15 Hip Right (Active)   Peri-wound Assessment Clean;Dry 06/25/2015  1:15 PM   Pressure Ulcer Staging Unstagable 06/25/2015  1:15 PM   Assessment Yellow;Brown 06/25/2015  1:15 PM   Shape oval 06/25/2015  1:15 PM   Wound Length (cm) 1.5 cm 06/25/2015  1:15 PM   Wound Width (cm) 1 cm 06/25/2015  1:15 PM   Calculated Wound Size (cm^2) (l*w) 1.5 cm^2 06/25/2015  1:15 PM   Dressing Changed Changed/New 06/25/2015  1:15 PM   Dressing/Treatment Foam 06/25/2015  1:15 PM   Wound Cleansed Rinsed/Irrigated with saline 06/25/2015  1:15 PM   Dressing Change Due 06/28/15 06/25/2015  1:15 PM   Drainage Amount None 06/25/2015  1:15 PM   Odor None 06/25/2015  1:15 PM   Number of days:0           Wound 06/25/15 Other (Comment)  Abdomen Anterior;Mid midline abdominal surgical incision (Active)   Peri-wound Assessment Clean;Excoriated;Painful 06/25/2015  1:15 PM   Peri-Wound Texture Localized edema 06/25/2015  1:15 PM   Peri-Wound Moisture Weeping 06/25/2015  1:15 PM   Peri-Wound Color Red 06/25/2015  1:15 PM   Non-staged Wound Description Full thickness 06/25/2015  1:15 PM   Assessment Clean;Drainage;Fragile 06/25/2015  1:15 PM   Wound Length (cm) 12 cm 06/25/2015  1:15 PM   Wound Width (cm) 4 cm 06/25/2015  1:15 PM   Depth (cm)  3 06/25/2015  1:15 PM   Calculated Wound Size (cm^2) (l*w) 48 cm^2 06/25/2015  1:15 PM   Closure None 06/25/2015  1:15 PM   Dressing Changed Changed/New 06/25/2015  1:15 PM   Dressing/Treatment Moist to moist 06/25/2015  1:15 PM   Wound Cleansed Rinsed/Irrigated with saline 06/25/2015  1:15 PM   Dressing Change Due 06/26/15 06/25/2015  1:15 PM   Drainage Amount Large 06/25/2015  1:15 PM   Drainage Description Foul purulent 06/25/2015  1:15 PM   Odor Strong 06/25/2015  1:15 PM   Margins Defined edges 06/25/2015  1:15 PM   Procedural Pain 10 06/25/2015  1:15 PM   Number of days:0       Wound 06/25/15 Other (Comment) Abdomen Left;Lateral open incision to left lateral abdominal wall (Active)   Peri-wound Assessment Clean 06/25/2015  1:15 PM   Peri-Wound Moisture Painful 06/25/2015  1:15 PM   Assessment Clean 06/25/2015  1:15 PM   Shape oval 06/25/2015  1:15 PM   Wound Length (cm) 6 cm 06/25/2015  1:15 PM   Wound Width (cm) 1 cm 06/25/2015  1:15 PM   Depth (cm)  3 06/25/2015  1:15 PM   Calculated Wound Size (cm^2) (l*w) 6 cm^2 06/25/2015  1:15 PM   Dressing  Changed Changed/New 06/25/2015  1:15 PM   Dressing/Treatment Moist to moist 06/25/2015  1:15 PM   Wound Cleansed Rinsed/Irrigated with saline 06/25/2015  1:15 PM   Dressing Change Due 06/26/15 06/25/2015  1:15 PM   Drainage Amount Large 06/25/2015  1:15 PM   Drainage Description Tan 06/25/2015  1:15 PM   Odor Mild 06/25/2015  1:15 PM   Margins Defined edges 06/25/2015  1:15 PM   Number of days:0      Wounds to coccyx area measure 2x1.5x0.1cm and 1.5x1x0.1cm.  Has right and left nephrostomy tubes. Insertion sites are clean and intact. No erythema.  Has either fistula or old drain site to LLQ, pouch removed. And covered with dry dressing.    Midline abd incision draining tan to feculent material. When moved, drainage appears to move from lateral wound to midline wound.   Unable to tell if they communicate because pt is anxious and having pain. Awaiting CT scan for further work-up.  Looks like exposed bowel with fistula in the midline.    Has RLQ stoma, according to CT report is a colostomy. Given RLQ location, would have suspected ileostomy. There is no output to be able to help discern between the two.     Pouch changed, peristomal skin is intact, stoma is pink, moist, protrudes well. Again, no output.    Response to treatment:  With complaints of pain.     Pain Assessment:  Severity:  10 / 10  Premedicated: Yes    Plan   Plan of Care: Wound 06/25/15 Other (Comment) Abdomen Anterior;Mid midline abdominal surgical incision-Dressing/Treatment: Moist to moist  Wound 06/25/15 Other (Comment) Abdomen Left;Lateral open incision to left lateral abdominal wall-Dressing/Treatment: Moist to moist  Pressure Ulcer 06/25/15 Sacrum Left-Dressing/Treatment: Foam  [REMOVED] Wound 06/25/15 Other (Comment) Leg Lower;Right unstageable lateral right thigh -  see pressure ulcer LDA-Dressing/Treatment: Open to air  Pressure Ulcer 06/25/15 Hip Right-Dressing/Treatment: Foam    - foam dressing to pressure ulcers  -moist saline dressing to abd wounds-dry dressing to LLQ ?fistula/old drain site  -routine ostomy care  - will attempt to get wound manager sample from [facility]. Should be here tomorrow morning. Will plan to pouch over both abd wounds.    Specialty Bed Required : No   [x]  Low Air Loss on ICU mattress  []  Pressure Redistribution  []  Fluid Immersion  []  Bariatric  []  Total Pressure Relief  []  Other:     Current Diet: Diet  NPO Effective Now  Dietician consult:  Yes    Discharge Plan:  Placement for patient upon discharge: TBD  Patient appropriate for Outpatient [facility]: Yes    Referrals:  [x]  Social Worker  []  [place]  []  Supplies  []  Other    Patient/Caregiver Teaching:  Level of patient/caregiver understanding able to:   []  Indicates understanding       []  Needs reinforcement  []  Unsuccessful      []  Verbal Understanding  []  Demonstrated understanding       []  No evidence of learning  []  Refused teaching         [x]  N/A       Electronically signed by Carollee Herter, CWOCN on 06/25/2015 at 3:21 PM

## 2015-06-25 NOTE — Consults (Signed)
[facility]        Consult    Pt Name: Erica Harding  MRN: [id number]  [place]: [birth year]  Admission date: 06/25/2015 10:16 AM   Date of evaluation: 06/25/2015  Reason for consult: abd pain, wounds  Patient evaluated at the request of Dr. Roanna Raider          SUBJECTIVE:    History of Present Illness:      Erica Harding is a 45 y.o. female who presents with abdominal pain, who we were asked to see for abdominal pain and abdominal wounds. She complains of generalized abdominal pain She is confused and unable to provide accurate history.     She has a complex medical and surgical history.  She reports she had surgery last week in [place] for cancer, and was dishcarged and came to [place] with her son to be near family.  There is no family at bedside during exam,Dr. Roanna Raider contacted POA, pts mother, who stated she underwent surgery 6 years ago for colorectal cancer (suspect LAR), had chemo and radiation, she developed multiple fistulas, and has had multiple abdominal surgeries since.   Attempting to get records from OSH.          Hx of abdominal surgery: multiple    Past Medical History   has a past medical history of Colorectal cancer ([place]).    Past Surgical History   has a past surgical history that includes Colonoscopy; Abdomen surgery; and Hysterectomy.    Medications  Prior to Admission medications    Medication Sig Start Date End Date Taking? Authorizing Provider   Megestrol Acetate 800 MG/20ML SUSP Take by mouth   Yes Historical Provider, MD   morphine (KADIAN) 30 MG extended release capsule Take 30 mg by mouth daily .   Yes Historical Provider, MD   oxyCODONE HCl (OXY-IR) 10 MG immediate release tablet Take 10 mg by mouth every 4 hours as needed for Pain .   Yes Historical Provider, MD   gabapentin (NEURONTIN) 300 MG capsule Take 300 mg by mouth 2 times daily   Yes Historical Provider, MD   tamsulosin (FLOMAX) 0.4 MG capsule Take 0.4 mg by mouth daily   Yes Historical  Provider, MD    Scheduled Meds:  ??? metroNIDAZOLE  500 mg Intravenous Once   ??? sodium chloride flush  10 mL Intravenous 2 times per day   ??? famotidine (PEPCID) injection  20 mg Intravenous Daily   ??? enoxaparin  30 mg Subcutaneous Daily   ??? ipratropium-albuterol  1 ampule Inhalation 4x daily   ??? calcium gluconate IVPB  1 g Intravenous Once     Continuous Infusions:   PRN Meds:.HYDROmorphone, sodium chloride flush, potassium chloride, potassium chloride, magnesium sulfate, acetaminophen, ondansetron    Allergies  is allergic to latex and compazine [prochlorperazine maleate].    Family History  family history is not on file.    Social History   reports that she has never smoked. She does not have any smokeless tobacco history on file. She reports that she does not drink alcohol or use illicit drugs.      REVIEW OF SYSTEMS:  CONSTITUTIONAL:  negative  HEENT:  negative  RESPIRATORY:  negative  CARDIOVASCULAR:  negative  GASTROINTESTINAL:  negative except for abdominal pain  GENITOURINARY:  negative  HEMATOLOGIC/LYMPHATIC:  negative  NEUROLOGICAL:  negative  SKIN: negative    PHYSICAL EXAM:  VITALS:    Visit Vitals   ??? BP 102/66   ???  Pulse 112   ??? Temp 97.1 ??F (36.2 ??C) (Oral)   ??? Resp 14   ??? Ht 5\' 2"  (1.575 m)   ??? Wt 90 lb (40.8 kg)   ??? SpO2 97%   ??? BMI 16.46 kg/m2     24HR INTAKE/OUTPUT:  No intake or output data in the 24 hours ending 06/25/15 1115  DRAIN/TUBE OUTPUT:     CONSTITUTIONAL:  alert, moderated distress and thin  EYES:  sclera clear  ENT:  normocepalic, without obvious abnormality  NECK:  supple, symmetrical, trachea midline  LUNGS:  clear to auscultation  CARDIOVASCULAR:  regular rate and rhythm - tachy  ABDOMEN:  midline abdominal wound open, bowel exposed, fistula with active stool.  LLQ wound open with feculent drainage.  Stoma pink - no enteric output.  Nephrostomy tubes in place.    MUSCULOSKELETAL:  0+ pitting edema lower extremities  NEUROLOGIC:  Mental Status Exam:  Level of Alertness:    awake  Orientation:   person, place  SKIN:  no lesions, no abnormal moles and no jaundice    DATA:    CBC:   Recent Labs      06/25/15   0705   WBC  55.6*   HGB  9.4*   HCT  31.6*   PLT  187     BMP:    Recent Labs      06/25/15   0705   NA  131*   K  6.1*   CL  93*   CO2  20*   BUN  50*   CREATININE  1.5*   GLUCOSE  99     Hepatic:   Recent Labs      06/25/15   0705   AST  20   ALT  30   BILITOT  0.7   ALKPHOS  151*     Mag:    No results for input(s): MG in the last 72 hours.   Phos:   No results for input(s): PHOS in the last 72 hours.   INR: No results for input(s): INR in the last 72 hours.      CULTURES:    Anaerobic culture  No results for input(s): LABANAE in the last 72 hours.    Blood culture  No results for input(s): BC in the last 72 hours.    Blood culture 2  No results for input(s): BLOODCULT2 in the last 72 hours.    Body fluid culture  No results for input(s): BLOODCULT2 in the last 72 hours.    Surgical culture  No results for input(s): CXSURG in the last 72 hours.    Fecal occult  No results for input(s): OCCULTBLDFEC in the last 72 hours.    Gram stain  No results for input(s): LABGRAM in the last 72 hours.    Stool culture 1  No results for input(s): CXST in the last 72 hours.    Stool culture 2  No results for input(s): STOOLCULT2 in the last 72 hours.    Urine culture  No results for input(s): LABURIN in the last 72 hours.    Wound abscess  No results for input(s): WNDABS in the last 72 hours.      IMAGING:  I have personally reviewed the following films:  CT scan abd/pelvis:   CT ABDOMEN PELVIS WO IV CONTRAST Additional Contrast? None [id number] Collected: 06/25/15 0658    ?? Updated: 06/25/15 [id number]    ?? Narrative: ??   ?? EXAMINATION:  CT  OF THE ABDOMEN AND PELVIS WITHOUT CONTRAST 06/25/2015 6:51 am    TECHNIQUE:  CT of the abdomen and pelvis was performed without the administration of  intravenous contrast. Multiplanar reformatted images are provided for review.  Dose modulation, iterative  reconstruction, and/or weight based adjustment of  the mA/kV was utilized to reduce the radiation dose to as low as reasonably  achievable.    COMPARISON:  None.    HISTORY:  ORDERING SYSTEM PROVIDED HISTORY: recent surgery, mult open wounds  TECHNOLOGIST PROVIDED HISTORY:  Additional Contrast?->None  Acuity: Chronic  Type of Encounter: Ongoing    FINDINGS:  Lower Chest: ??Motion artifact degrades image quality. ??The lung bases are  clear.    Organs: The unenhanced liver, spleen, pancreas, and adrenal glands are  unremarkable. ??Status post bilateral percutaneous nephrostomy tubes and left  ureteral stent. ??Addition, there are multiple radiopaque foreign bodies along  the course of the right mid ureter which likely represent coils; correlate  with surgical history.    GI/Bowel: The lack of intravenous and enteric contrast significantly limits  evaluation of the peritoneum. ??Suture material is present within the inferior  peritoneum likely from low anterior resection. ??A right lower quadrant  colostomy is noted. ??Status post multiple small bowel resections with  reanastomosie.    Pelvis: Status post hysterectomy. ??There is present within the lumen of the  bladder likely from recent manipulation.    Peritoneum/Retroperitoneum: A moderate amount of complex fluid with multiple  locules of air are present throughout intraperitoneal and extraperitoneal  space of the pelvis concerning for infectious process there is been no recent  surgery.    Bones/Soft Tissues: The osseous structures are unremarkable.    There 2 open wounds within the midline of the lower abdomen and pelvis and  left lower quadrant with debris, possibly due to packing material. ??A  moderate amount of subcutaneous emphysema is present within the left lower  quadrant.     ?? Impression: ??   ?? 1. Moderate amount of complex intraperitoneal and extraperitoneal fluid with  multiple locules of air concerning for an infectious process including  possible  anastomotic leak in the absence of recent surgery, limited by the  lack of intravenous and enteric contrast.         ASSESSMENT:  1. abd pain-  Patient Active Problem List   Diagnosis   ??? AKI (acute kidney injury) ([place])   ??? Hyperkalemia   ??? Open wnd ant abdomen-comp   ??? Postprocedural intra-abdominal sepsis ([place])   2.     PLAN:  1. Repeat CT with oral contrast - concern for pelvic abscess, may be able to have IR drain  2. Wound care  3. Abx: as ordered  4. Recommend PICC and TPN - strict bowel rest  5. IVF   6. PPI  7. FU labs  8. Supportive care  9.       Thank you for the interesting evaluation. Will review and discuss with Dr.Joyleen Haselton.  Further recommendations to follow.      Electronically signed by Delorse Limber, APRN on 06/25/2015 at 11:15 AM     Patient is a poor historian. History was obtained per chart. The patient has a history of a colon resection (probably an LAR) for cancer with subsequent radiation several years ago. 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 She is S/P exploration in [place] within the last few months. She presents today with abdominal pain and feculent abdominal wound  drainage. 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 There is a healthy appearing ileostomy in the RLQ which is having no output. CT scan reveals a complex pelvic fluid collection. It appears that she has a staple line of the mid rectum. The CT was reviewed with IR. The plan will be to repeat the CT scan with oral contrast to better delineate the pelvic anatomy and to evaluate for other intra-abdominal collections.  Will then make plans for percutaneous drainage of all collections. Will have PICC line placed and start patient on complete bowel rest and TPN.  Broad spectrum antibiotics have been ordered. Would eventually plan for transfer to LTAC which is the only appropriate disposition for this patient if she  wishes to pursue aggressive care. Palliative care/hospice is the other option.

## 2015-06-25 NOTE — Progress Notes (Signed)
RESPIRATORY THERAPY ASSESSMENT    Erica Harding   ICU-[id number]  [birth year]      Recent Surgical Procedures:  None  Pulmonary History: None  Home Respiratory Therapy: None  Home Oxygen Therapy: None  Current Respiratory Therapy: Duoneb Q4 wa PRN  Vitals:    06/25/15 1030   BP:    Pulse: 108   Resp: 23   Temp: 98.3 ??F (36.8 ??C)   SpO2:        MPVC: patient unable  Actual VC: Patient unable  Sputum:  ,  ,      ASSESSMENT OF RESPIRATORY SEVERITY INDEX (RSI)    Patients with orders for inhalation medications, oxygen, or any therapeutic treatment modality will be placed on Respiratory Protocol.  They will be assessed with the first treatment and at least every 72 hours thereafter.  The following severity scale will be used to determine frequency of treatment intervention.    Respiratory History: No Smoking History = 0    Recent Surgical History: None = 0    Level of Consciousness: Alert, Oriented, and Cooperative = 0    Level of Activity: Walking with assistance = 1    Respiratory Pattern: Regular Pattern; RR 8-20 = 0    Breath Sounds: Diminshed bilaterally and/or crackles = 2    Cough: Strong, spontaneous, non-productive = 0    SPO2 (COPD values may differ): Greater than or equal to 92% on room air = 0    Peak Flow (asthma only): not applicable = 0    RSI: 0-5 = See once and convert to home regimen or discontinue  PLAN    Goals: volume expansion and improve oxygenation    Patient educated on: Oxygenation     Comment / Outcomes: Support patient needs    Plan of Care: Duoneb Q4 PRN    Is patient being placed on Home Treatment Regimen? No      Respiratory Protocol Guidelines    1. Assessment and treatment by Respiratory Therapy will be initiated for medication and therapeutic interventions upon initiation of aerosolized medication.  2. Physician will be contacted for respiratory rate (RR) greater than 35 breaths per minute. Therapy will be held for heart rate (HR) greater than 140 beats per minute, pending direction from  physician.  3. Bronchodilators will be administered via Metered Dose Inhaler (MDI) or Hydrofluoroalkane (HFA) with spacer when the following criteria are met:  a. Alert and cooperative     b. HR < 140 bpm  c. RR < 30 bpm                d. Can demonstrate a 2???3 second inspiratory hold  4. Bronchodilators will be administered via Hand Held Nebulizer [facility]) to patients when ANY of the following criteria are met  a. Incogizant or uncooperative          b. Patients treated with HHN at Home        c. Unable to demonstrate proper MDI or HFA technique     d. RR > 30 bpm   5. Bronchodilators will be delivered via Metered Dose Inhaler (MDI) or Hydrofluoroalkane (HFA) with spacer to intubated patients on mechanical ventilation.  6. Inhalation medication orders will be delivered and/or substituted as outlined below.    Aerosolized Medications Ordering and Administration Guidelines:    1. All Medications will be ordered by a physician, and their frequency and/or modality will be adjusted as defined by the patients Respiratory Severity Index (RSI) score.  2. If  the patient does not have documented COPD, consider discontinuing anticholinergics when RSI is less than 9.  3. If the bronchospasm worsens (increased RSI), then the bronchodilator frequency can be increased to a maximum of every 4 hours.  If greater than every 4 hours is required, the physician will be contacted.  4. If the bronchospasm improves, the frequency of the bronchodilator can be decreased, based on the patient's RSI, but not less than home treatment regimen frequency.  5. Bronchodilator(s) will be discontinued if patient has a RSI less than 9 and has received no scheduled or as needed treatment for 72  Hrs.    Patients Ordered on a Mucolytic Agent:    1. Must always be administered with a bronchodilator.    2. Discontinue if patient experiences worsened bronchospasm, or secretions have lessened to the point that the patient is able to clear them with a  cough.    Anti-inflammatory and Combination Medications:    1. If the patient lacks prior history of lung disease, is not using inhaled anti-inflammatory medication at home, and lacks wheezing by examination or by history for at least 24 hours, contact physician for possible discontinuation.      Pharmacy formulary interchanges for Respiratory medications can also be found by using the Smart Phrase .[email or web address] in any EPIC comment field.

## 2015-06-25 NOTE — Progress Notes (Signed)
Vascular Assessment Note:  Assessed bilateral upper arms for veins of adequate size to place a PICC. Bilateral veins were extremely small.  Basilic veins were = to a 31fr size in total diameter. And brachial veins were possibly = to a 17fr size in total diameter. Cephalic veins were not visualized. The smallest PICC available at [facility] is a 4 fr and would not allow for blood flow around catheter. This patient is not a good candidate for a PICC as it would lead to a thrombus.

## 2015-06-26 ENCOUNTER — Encounter: Admit: 2015-06-26 | Primary: Internal Medicine

## 2015-06-26 LAB — COMPREHENSIVE METABOLIC PANEL
ALT: 29 U/L (ref 10–40)
ALT: 34 U/L (ref 10–40)
AST: 24 U/L (ref 15–37)
AST: 27 U/L (ref 15–37)
Albumin/Globulin Ratio: 0.4 — ABNORMAL LOW (ref 1.1–2.2)
Albumin/Globulin Ratio: 0.5 — ABNORMAL LOW (ref 1.1–2.2)
Albumin: 1.4 g/dL — ABNORMAL LOW (ref 3.4–5.0)
Albumin: 1.4 g/dL — ABNORMAL LOW (ref 3.4–5.0)
Alkaline Phosphatase: 122 U/L (ref 40–129)
Alkaline Phosphatase: 126 U/L (ref 40–129)
Anion Gap: 16 (ref 3–16)
Anion Gap: 19 — ABNORMAL HIGH (ref 3–16)
BUN: 40 mg/dL — ABNORMAL HIGH (ref 7–20)
BUN: 37 mg/dL — ABNORMAL HIGH (ref 7–20)
CO2: 23 mmol/L (ref 21–32)
CO2: 20 mmol/L — ABNORMAL LOW (ref 21–32)
Calcium: 7.8 mg/dL — ABNORMAL LOW (ref 8.3–10.6)
Calcium: 8.4 mg/dL (ref 8.3–10.6)
Chloride: 97 mmol/L — ABNORMAL LOW (ref 99–110)
Chloride: 99 mmol/L (ref 99–110)
Creatinine: 1.2 mg/dL — ABNORMAL HIGH (ref 0.6–1.1)
Creatinine: 1.1 mg/dL (ref 0.6–1.1)
GFR African American: 59 — AB (ref >60)
GFR African American: >60 (ref >60)
GFR Non-African American: 49 — AB (ref >60)
GFR Non-African American: 54 — AB (ref >60)
Globulin: 3.3 g/dL
Globulin: 3 g/dL
Glucose: 40 mg/dL — CL (ref 70–99)
Glucose: 96 mg/dL (ref 70–99)
Potassium: 3.2 mmol/L — ABNORMAL LOW (ref 3.5–5.1)
Potassium: 2.9 mmol/L — CL (ref 3.5–5.1)
Sodium: 136 mmol/L (ref 136–145)
Sodium: 138 mmol/L (ref 136–145)
Total Bilirubin: 0.6 mg/dL (ref 0.0–1.0)
Total Bilirubin: 0.5 mg/dL (ref 0.0–1.0)
Total Protein: 4.7 g/dL — ABNORMAL LOW (ref 6.4–8.2)
Total Protein: 4.4 g/dL — ABNORMAL LOW (ref 6.4–8.2)

## 2015-06-26 LAB — CBC WITH AUTO DIFFERENTIAL
Atypical Lymphocytes Relative: 2 % (ref 0–6)
Bands Relative: 1 % (ref 0–7)
Bands Relative: 1 % (ref 0–7)
Bands Relative: 6 % (ref 0–7)
Basophils %: 0 %
Basophils %: 0 %
Basophils %: 0 %
Basophils Absolute: 0 10*3/uL (ref 0.0–0.2)
Basophils Absolute: 0 10*3/uL (ref 0.0–0.2)
Basophils Absolute: 0 10*3/uL (ref 0.0–0.2)
Eosinophils %: 0 %
Eosinophils %: 0 %
Eosinophils %: 0 %
Eosinophils Absolute: 0 10*3/uL (ref 0.0–0.6)
Eosinophils Absolute: 0 10*3/uL (ref 0.0–0.6)
Eosinophils Absolute: 0 10*3/uL (ref 0.0–0.6)
Hematocrit: 26 % — ABNORMAL LOW (ref 36.0–48.0)
Hematocrit: 26 % — ABNORMAL LOW (ref 36.0–48.0)
Hematocrit: 26.6 % — ABNORMAL LOW (ref 36.0–48.0)
Hemoglobin: 7.8 g/dL — ABNORMAL LOW (ref 12.0–16.0)
Hemoglobin: 7.8 g/dL — ABNORMAL LOW (ref 12.0–16.0)
Hemoglobin: 7.9 g/dL — ABNORMAL LOW (ref 12.0–16.0)
Lymphocytes %: 4 %
Lymphocytes %: 12 %
Lymphocytes %: 5 %
Lymphocytes Absolute: 1.2 10*3/uL (ref 1.0–5.1)
Lymphocytes Absolute: 3.6 10*3/uL (ref 1.0–5.1)
Lymphocytes Absolute: 2.2 10*3/uL (ref 1.0–5.1)
MCH: 26.1 pg (ref 26.0–34.0)
MCH: 26.3 pg (ref 26.0–34.0)
MCH: 26 pg (ref 26.0–34.0)
MCHC: 30 g/dL — ABNORMAL LOW (ref 31.0–36.0)
MCHC: 30 g/dL — ABNORMAL LOW (ref 31.0–36.0)
MCHC: 29.6 g/dL — ABNORMAL LOW (ref 31.0–36.0)
MCV: 87.1 fL (ref 80.0–100.0)
MCV: 87.8 fL (ref 80.0–100.0)
MCV: 87.6 fL (ref 80.0–100.0)
MPV: 7 fL (ref 5.0–10.5)
MPV: 7.1 fL (ref 5.0–10.5)
MPV: 7.4 fL (ref 5.0–10.5)
Metamyelocytes Relative: 1 % — AB
Monocytes %: 2 %
Monocytes %: 4 %
Monocytes %: 1 %
Monocytes Absolute: 0.6 10*3/uL (ref 0.0–1.3)
Monocytes Absolute: 1.2 10*3/uL (ref 0.0–1.3)
Monocytes Absolute: 0.3 10*3/uL (ref 0.0–1.3)
Myelocyte Percent: 1 % — AB
Neutrophils %: 93 %
Neutrophils %: 81 %
Neutrophils %: 86 %
Neutrophils Absolute: 28.8 10*3/uL — ABNORMAL HIGH (ref 1.7–7.7)
Neutrophils Absolute: 25.2 10*3/uL — ABNORMAL HIGH (ref 1.7–7.7)
Neutrophils Absolute: 28.9 10*3/uL — ABNORMAL HIGH (ref 1.7–7.7)
PLATELET SLIDE REVIEW: ADEQUATE
PLATELET SLIDE REVIEW: DECREASED
PLATELET SLIDE REVIEW: DECREASED
Platelets: 146 10*3/uL (ref 135–450)
Platelets: 136 10*3/uL (ref 135–450)
Platelets: 122 10*3/uL — ABNORMAL LOW (ref 135–450)
RBC: 2.99 M/uL — ABNORMAL LOW (ref 4.00–5.20)
RBC: 2.96 M/uL — ABNORMAL LOW (ref 4.00–5.20)
RBC: 3.03 M/uL — ABNORMAL LOW (ref 4.00–5.20)
RDW: 24.1 % — ABNORMAL HIGH (ref 12.4–15.4)
RDW: 24.1 % — ABNORMAL HIGH (ref 12.4–15.4)
RDW: 24 % — ABNORMAL HIGH (ref 12.4–15.4)
WBC: 30.6 10*3/uL — ABNORMAL HIGH (ref 4.0–11.0)
WBC: 30 10*3/uL — ABNORMAL HIGH (ref 4.0–11.0)
WBC: 31.4 10*3/uL — ABNORMAL HIGH (ref 4.0–11.0)

## 2015-06-26 LAB — BASIC METABOLIC PANEL
Anion Gap: 13 (ref 3–16)
BUN: 38 mg/dL — ABNORMAL HIGH (ref 7–20)
CO2: 23 mmol/L (ref 21–32)
Calcium: 7.5 mg/dL — ABNORMAL LOW (ref 8.3–10.6)
Chloride: 102 mmol/L (ref 99–110)
Creatinine: 1 mg/dL (ref 0.6–1.1)
GFR African American: >60 (ref >60)
GFR Non-African American: >60 (ref >60)
Glucose: 73 mg/dL (ref 70–99)
Potassium: 3.6 mmol/L (ref 3.5–5.1)
Sodium: 138 mmol/L (ref 136–145)

## 2015-06-26 LAB — POCT GLUCOSE
POC Glucose: 48 mg/dl — CL (ref 70–99)
POC Glucose: 131 mg/dl — ABNORMAL HIGH (ref 70–99)

## 2015-06-26 LAB — CALCIUM, IONIZED
Calcium, Ionized: 0.97 mmol/L — ABNORMAL LOW (ref 1.12–1.32)
pH, Ven: 7.41 (ref 7.35–7.45)

## 2015-06-26 LAB — TYPE AND SCREEN: ABO/Rh: B POS

## 2015-06-26 LAB — ANTIBODY SCREEN: Antibody Screen: NEGATIVE

## 2015-06-26 LAB — MAGNESIUM: Magnesium: 1.4 mg/dL — ABNORMAL LOW (ref 1.80–2.40)

## 2015-06-26 LAB — LACTIC ACID: Lactic Acid: 5 mmol/L (ref 0.4–2.0)

## 2015-06-26 MED ORDER — IPRATROPIUM BROMIDE HFA 17 MCG/ACT IN AERS
17 MCG/ACT | RESPIRATORY_TRACT | Status: DC | PRN
Start: 2015-06-26 — End: 2015-06-28

## 2015-06-26 MED ORDER — DEXTROSE 50 % IV SOLN
50 % | INTRAVENOUS | Status: AC
Start: 2015-06-26 — End: 2015-06-26

## 2015-06-26 MED ORDER — SODIUM CHLORIDE 0.9 % IV SOLN
0.9 % | Freq: Once | INTRAVENOUS | Status: AC
Start: 2015-06-26 — End: 2015-06-26
  Administered 2015-06-26: 13:00:00 1 g via INTRAVENOUS

## 2015-06-26 MED ORDER — INSULIN LISPRO 100 UNIT/ML SC SOPN
100 UNIT/ML | Freq: Every evening | SUBCUTANEOUS | Status: DC
Start: 2015-06-26 — End: 2015-06-26

## 2015-06-26 MED ORDER — MAGNESIUM SULFATE 2000 MG/50 ML IVPB PREMIX
2 GM/50ML | Freq: Once | INTRAVENOUS | Status: AC
Start: 2015-06-26 — End: 2015-06-26
  Administered 2015-06-26: 11:00:00 2 g via INTRAVENOUS

## 2015-06-26 MED ORDER — SODIUM CHLORIDE 0.9 % IV SOLN
0.9 % | INTRAVENOUS | Status: AC
Start: 2015-06-26 — End: 2015-06-26
  Administered 2015-06-26: 11:00:00 500

## 2015-06-26 MED ORDER — ALBUTEROL SULFATE HFA 108 (90 BASE) MCG/ACT IN AERS
108 (90 Base) MCG/ACT | RESPIRATORY_TRACT | Status: DC | PRN
Start: 2015-06-26 — End: 2015-06-28

## 2015-06-26 MED ORDER — LIDOCAINE HCL (PF) 1 % IJ SOLN
1 | INTRAMUSCULAR | Status: AC
Start: 2015-06-26 — End: 2015-06-26

## 2015-06-26 MED ORDER — DEXTROSE 5 % IV SOLN
5 % | INTRAVENOUS | Status: DC | PRN
Start: 2015-06-26 — End: 2015-07-04

## 2015-06-26 MED ORDER — SODIUM CHLORIDE 0.9 % IV SOLN
0.9 % | Freq: Every day | INTRAVENOUS | Status: AC
Start: 2015-06-26 — End: 2015-06-28
  Administered 2015-06-27 – 2015-06-28 (×3): 300 mg via INTRAVENOUS

## 2015-06-26 MED ORDER — CLINIMIX E/DEXTROSE (5/20) 5 % IV SOLN
5 % | INTRAVENOUS | Status: AC
Start: 2015-06-26 — End: 2015-06-27
  Administered 2015-06-27: 01:00:00 via INTRAVENOUS

## 2015-06-26 MED ORDER — INSULIN LISPRO 100 UNIT/ML SC SOPN
100 UNIT/ML | SUBCUTANEOUS | Status: DC
Start: 2015-06-26 — End: 2015-07-04
  Administered 2015-06-27 – 2015-06-29 (×12): 1 [IU] via SUBCUTANEOUS
  Administered 2015-06-30: 2 [IU] via SUBCUTANEOUS
  Administered 2015-06-30 (×4): 1 [IU] via SUBCUTANEOUS
  Administered 2015-06-30 – 2015-07-01 (×2): 2 [IU] via SUBCUTANEOUS
  Administered 2015-07-01 – 2015-07-04 (×8): 1 [IU] via SUBCUTANEOUS

## 2015-06-26 MED ORDER — GLUCAGON HCL RDNA (DIAGNOSTIC) 1 MG IJ SOLR
1 MG | INTRAMUSCULAR | Status: DC | PRN
Start: 2015-06-26 — End: 2015-07-04

## 2015-06-26 MED ORDER — FENTANYL CITRATE (PF) 100 MCG/2ML IJ SOLN
100 MCG/2ML | Freq: Once | INTRAMUSCULAR | Status: AC
Start: 2015-06-26 — End: 2015-06-26
  Administered 2015-06-26: 17:00:00 100 ug via INTRAVENOUS

## 2015-06-26 MED ORDER — SODIUM BICARBONATE 4.2 % IV SOLN
4.2 | INTRAVENOUS | Status: AC
Start: 2015-06-26 — End: 2015-06-26

## 2015-06-26 MED ORDER — PROPOFOL 1000 MG/100ML IV EMUL
1000 MG/100ML | INTRAVENOUS | Status: DC
Start: 2015-06-26 — End: 2015-06-29
  Administered 2015-06-26: 21:00:00 70 ug/kg/min via INTRAVENOUS
  Administered 2015-06-26: 17:00:00 5 ug/kg/min via INTRAVENOUS
  Administered 2015-06-27 – 2015-06-28 (×2): 10 ug/kg/min via INTRAVENOUS

## 2015-06-26 MED ORDER — SODIUM CHLORIDE 0.9 % IV BOLUS
0.9 % | Freq: Once | INTRAVENOUS | Status: AC
Start: 2015-06-26 — End: 2015-06-26
  Administered 2015-06-26: 05:00:00 1000 mL via INTRAVENOUS

## 2015-06-26 MED ORDER — INSULIN LISPRO 100 UNIT/ML SC SOPN
100 UNIT/ML | Freq: Three times a day (TID) | SUBCUTANEOUS | Status: DC
Start: 2015-06-26 — End: 2015-06-26

## 2015-06-26 MED ORDER — PROPOFOL 1000 MG/100ML IV EMUL
1000 MG/100ML | INTRAVENOUS | Status: AC
Start: 2015-06-26 — End: 2015-06-26
  Administered 2015-06-26: 23:00:00 20

## 2015-06-26 MED ORDER — THIAMINE HCL 100 MG/ML IJ SOLN
100 MG/ML | Freq: Every day | INTRAMUSCULAR | Status: DC
Start: 2015-06-26 — End: 2015-06-26

## 2015-06-26 MED ORDER — GLUCOSE 40 % PO GEL
40 % | ORAL | Status: DC | PRN
Start: 2015-06-26 — End: 2015-07-04

## 2015-06-26 MED ORDER — BACITRACIN-NEOMYCIN-POLYMYXIN 400-5-5000 EX OINT
400-5-5000 | CUTANEOUS | Status: AC
Start: 2015-06-26 — End: 2015-06-26

## 2015-06-26 MED ORDER — DEXTROSE 50 % IV SOLN
50 % | INTRAVENOUS | Status: DC | PRN
Start: 2015-06-26 — End: 2015-07-04
  Administered 2015-06-26: 04:00:00 12.5 g via INTRAVENOUS

## 2015-06-26 MED ORDER — FENTANYL CITRATE (PF) 100 MCG/2ML IJ SOLN
100 MCG/2ML | INTRAMUSCULAR | Status: AC
Start: 2015-06-26 — End: 2015-06-26
  Administered 2015-06-26: 23:00:00 100

## 2015-06-26 MED ORDER — FAT EMULSION 20 % IV EMUL
20 % | INTRAVENOUS | Status: DC
Start: 2015-06-26 — End: 2015-07-01

## 2015-06-26 MED FILL — TRIPLE ANTIBIOTIC 3.5-400-5000 EX OINT: CUTANEOUS | Qty: 0.9

## 2015-06-26 MED FILL — FENTANYL CITRATE (PF) 100 MCG/2ML IJ SOLN: 100 MCG/2ML | INTRAMUSCULAR | Qty: 2

## 2015-06-26 MED FILL — METRONIDAZOLE IN NACL 5-0.79 MG/ML-% IV SOLN: 5 mg/mL | INTRAVENOUS | Qty: 100

## 2015-06-26 MED FILL — FLUCONAZOLE IN SODIUM CHLORIDE 200-0.9 MG/100ML-% IV SOLN: INTRAVENOUS | Qty: 100

## 2015-06-26 MED FILL — HUMALOG KWIKPEN 100 UNIT/ML SC SOPN: 100 UNIT/ML | SUBCUTANEOUS | Qty: 3

## 2015-06-26 MED FILL — POTASSIUM CHLORIDE 20 MEQ/50ML IV SOLN: 20 MEQ/50ML | INTRAVENOUS | Qty: 150

## 2015-06-26 MED FILL — HYDROMORPHONE HCL 1 MG/ML IJ SOLN: 1 MG/ML | INTRAMUSCULAR | Qty: 1

## 2015-06-26 MED FILL — ZOSYN 3-0.375 GM/50ML IV SOLN: INTRAVENOUS | Qty: 50

## 2015-06-26 MED FILL — NORMAL SALINE FLUSH 0.9 % IV SOLN: 0.9 % | INTRAVENOUS | Qty: 10

## 2015-06-26 MED FILL — THIAMINE HCL 100 MG/ML IJ SOLN: 100 MG/ML | INTRAMUSCULAR | Qty: 3

## 2015-06-26 MED FILL — DEXTROSE 50 % IV SOLN: 50 % | INTRAVENOUS | Qty: 50

## 2015-06-26 MED FILL — SODIUM CHLORIDE 0.9 % IV SOLN: 0.9 % | INTRAVENOUS | Qty: 500

## 2015-06-26 MED FILL — PROPOFOL 1000 MG/100ML IV EMUL: 1000 MG/100ML | INTRAVENOUS | Qty: 100

## 2015-06-26 MED FILL — VANCOMYCIN HCL IN DEXTROSE 500-5 MG/100ML-% IV SOLN: 500-5 MG/100ML-% | INTRAVENOUS | Qty: 100

## 2015-06-26 MED FILL — PROAIR HFA 108 (90 BASE) MCG/ACT IN AERS: 108 (90 Base) MCG/ACT | RESPIRATORY_TRACT | Qty: 8.5

## 2015-06-26 MED FILL — CLINIMIX E/DEXTROSE (5/20) 5 % IV SOLN: 5 % | INTRAVENOUS | Qty: 1000

## 2015-06-26 MED FILL — ATROVENT HFA 17 MCG/ACT IN AERS: 17 MCG/ACT | RESPIRATORY_TRACT | Qty: 12.9

## 2015-06-26 MED FILL — FAMOTIDINE 20 MG/2ML IV SOLN: 20 MG/2ML | INTRAVENOUS | Qty: 2

## 2015-06-26 MED FILL — LIDOCAINE HCL (PF) 1 % IJ SOLN: 1 % | INTRAMUSCULAR | Qty: 30

## 2015-06-26 MED FILL — SODIUM CHLORIDE 0.9 % IV SOLN: 0.9 % | INTRAVENOUS | Qty: 1000

## 2015-06-26 MED FILL — CALCIUM CHLORIDE 10 % IV SOLN: 10 % | INTRAVENOUS | Qty: 10

## 2015-06-26 MED FILL — SODIUM BICARBONATE 4.2 % IV SOLN: 4.2 % | INTRAVENOUS | Qty: 5

## 2015-06-26 MED FILL — MAGNESIUM SULFATE 2 GM/50ML IV SOLN: 2 GM/50ML | INTRAVENOUS | Qty: 50

## 2015-06-26 MED FILL — NORMAL SALINE FLUSH 0.9 % IV SOLN: 0.9 % | INTRAVENOUS | Qty: 60

## 2015-06-26 MED FILL — HYDROMORPHONE HCL 1 MG/ML IJ SOLN: 1 MG/ML | INTRAMUSCULAR | Qty: 0.5

## 2015-06-26 NOTE — Consults (Signed)
Discharge planning-    Patient is on the vent. Conferred with Surveyor, quantity. She will assess and continue to follow for potential LTAC needs.    Plan- Unclear at this time, patient remains on vent. [facility] following.    Will continue to follow for support and discharge planning.    -Gershon Mussel, MSW, LSW

## 2015-06-26 NOTE — Progress Notes (Signed)
06/26/15 1221   Vent Information   Mean Airway Pressure 8 cmH20   Plateau Pressure 17 cmH20   Static Compliance 67.5 mL/cmH2O   Dynamic Compliance 39.41 mL/cmH2O

## 2015-06-26 NOTE — Care Coordination-Inpatient (Addendum)
[facility] Contin[facility] Nurse  Follow-up Progress Note       NAME:  Erica Harding  MEDICAL RECORD NUMBER:  [id number]  AGE:  45 y.o.   GENDER:  female  DOB:  12-12-1970  TODAY'S DATE:  06/26/2015    Subjective:   Pt seen for placement of large fistula manager.     Objective:      Visit Vitals   ??? BP 103/71   ??? Pulse 104   ??? Temp 97.7 ??F (36.5 ??C) (Temporal)   ??? Resp 12   ??? Ht 5\' 2"  (1.575 m)   ??? Wt 88 lb 2.9 oz (40 kg)   ??? SpO2 100%   ??? BMI 16.13 kg/m2     Braden Risk Score: Braden Scale Score: 11  Assessment:             Left lateral wound draining copious amounts of liquid stool.  Midline wound with small amount of liquid stool drainage. Odor seems to be less today.    Placed in 1 pc ostomy pouch. Coloplast fistula manager placed over midline and left lateral wounds.    Response to treatment:  Poorly tolerated by patient.     Pain Assessment:  Premedicated: Yes  Plan:   Plan of Care: Wound 06/25/15 Other (Comment) Abdomen Anterior;Mid midline abdominal surgical incision-Dressing/Treatment: Moist to moist  Wound 06/25/15 Other (Comment) Abdomen Left;Lateral open incision to left lateral abdominal wall-Dressing/Treatment: Moist to moist  Pressure Ulcer 06/25/15 Sacrum Left-Dressing/Treatment: Foam  [REMOVED] Wound 06/25/15 Other (Comment) Leg Lower;Right unstageable lateral right thigh -  see pressure ulcer LDA-Dressing/Treatment: Open to air  Pressure Ulcer 06/25/15 Hip Right-Dressing/Treatment: Foam    Use moist saline dressing to midline dressing.  Place dry gauze into left lateral wound.   Would change dressings four times a day.  Ok to apply paste around edges of wound each shift to help increase wear time.      Dressing changed to left nephrostomy tube. Site intact and dry. Suspect drainage has been from leaking fistula on the left lateral, color is the same green to brown on dressing ans output from fistula.    Specialty Bed Required : No   []  Low Air Loss   []  Pressure  Redistribution  []  Fluid Immersion  []  Bariatric  []  Total Pressure Relief  []  Other:     Current Diet: Diet NPO Effective Now  Dietician consult:  Yes    Discharge Plan:  Placement for patient upon discharge: TBD  Patient appropriate for Outpatient [facility]: No    Referrals:  []  Social Worker  []  [place]  []  Supplies  []  Other    Patient/Caregiver Teaching:  Level of patient/caregiver understanding able to:   []  Indicates understanding       []  Needs reinforcement  []  Unsuccessful      []  Verbal Understanding  []  Demonstrated understanding       [x]  No evidence of learning  []  Refused teaching         []  N/A       Electronically signed by Carollee Herter, CWOCN on 06/26/2015 at 10:37 AM

## 2015-06-26 NOTE — Progress Notes (Addendum)
Hospitalist updated regarding low Mg 1.4 and Ion Ca 0.97. See new order. Will give Mag Sulfate 2g and Calcium Chloride 1g IVPB. Lenard Forth     [phone]- Paged hospitalist regarding low UO, 175 through nephrostomy tubes. Awaiting call back.

## 2015-06-26 NOTE — Progress Notes (Signed)
Pt electively intubated for procedures, intubated with size 8 ETT LL 22, pt pre medicated with fentanyl 100 mcg and received 50 mcg of Propofol pushed by Dr. Roanna Raider.  Pt experienced drop in blood pressure and was started on levophed drip for short time.  See pt flow sheets.

## 2015-06-26 NOTE — Progress Notes (Signed)
[facility]  ICU F/U Note    Subjective:   CC / HPI: sepsis, intraabdominal abscess  Patient is very anxious and complains of pain on and off. Could not have PICC line    PMH: Reviewed, no changes    PSH: Reviewed, no changes      Review of Systems  Constitutional: Negative for fever, chills, diaphoresis.  Respiratory: See HPI  Cardiovascular: Negative for chest pain, palpitations and leg swelling.   Gastrointestinal: positive for abdominal pain   Genitourinary: Negative for dysuria, urgency, frequency, hematuria, decreased urine volume, and enuresis.   Skin: Negative.  Negative for color change and pallor.   Neurological: No dizziness, lightheaded, loss of consciousness, HA or neck stiffness     Objective:   Data Review:  Vital Signs:   Visit Vitals   ??? BP 103/71   ??? Pulse 104   ??? Temp 97.7 ??F (36.5 ??C) (Temporal)   ??? Resp 12   ??? Ht 5\' 2"  (1.575 m)   ??? Wt 88 lb 2.9 oz (40 kg)   ??? SpO2 100%   ??? BMI 16.13 kg/m2       24 Hour I&O Review:     Intake/Output Summary (Last 24 hours) at 06/26/15 1004  Last data filed at 06/26/15 0626   Gross per 24 hour   Intake             1606 ml   Output              825 ml   Net              781 ml     Continuous Infusions:  ??? dextrose       Current Medications: Current Facility-Administered Medications: glucose (GLUTOSE) 40 % oral gel 15 g, 15 g, Oral, PRN  dextrose 50 % solution 12.5 g, 12.5 g, Intravenous, PRN  glucagon (rDNA) injection 1 mg, 1 mg, Intramuscular, PRN  dextrose 5 % solution, 100 mL/hr, Intravenous, PRN  dextrose 50 % solution, , ,   sodium chloride flush 0.9 % injection 10 mL, 10 mL, Intravenous, 2 times per day  sodium chloride flush 0.9 % injection 10 mL, 10 mL, Intravenous, PRN  potassium chloride 10 mEq/100 mL IVPB (Peripheral Line), 10 mEq, Intravenous, PRN  potassium chloride 20 mEq/50 mL IVPB (Central Line), 20 mEq, Intravenous, PRN  magnesium sulfate 1 g in dextrose 5% 100 mL IVPB premix, 1 g, Intravenous, PRN  acetaminophen (TYLENOL)  tablet 650 mg, 650 mg, Oral, Q4H PRN  ondansetron (ZOFRAN) injection 4 mg, 4 mg, Intravenous, Q6H PRN  famotidine (PEPCID) injection 20 mg, 20 mg, Intravenous, Daily  enoxaparin (LOVENOX) injection 30 mg, 30 mg, Subcutaneous, Daily  fluconazole (DIFLUCAN) 200 mg in 0.9 % NaCl 100 mL premix IVPB, 200 mg, Intravenous, Q24H  metronidazole (FLAGYL) 500 mg in NaCl 100 mL IVPB premix, 500 mg, Intravenous, Q8H  piperacillin-tazobactam (ZOSYN) 3.375 g in dextrose 50 mL IVPB extended infusion (premix), 3.375 g, Intravenous, Q8H  sodium chloride flush 0.9 % injection 10 mL, 10 mL, Intravenous, 2 times per day  sodium chloride flush 0.9 % injection 10 mL, 10 mL, Intravenous, PRN  ipratropium-albuterol (DUONEB) nebulizer solution 1 ampule, 1 ampule, Inhalation, Q4H PRN  vancomycin (VANCOCIN) 500 mg in dextrose 5% 100 mL IVPB, 500 mg, Intravenous, Q24H  fentaNYL (DURAGESIC) 25 MCG/HR 1 patch, 1 patch, Transdermal, Q72H  HYDROmorphone (DILAUDID) injection 0.5 mg, 0.5 mg, Intravenous, Q2H PRN **OR** HYDROmorphone (DILAUDID) injection 1 mg,  1 mg, Intravenous, Q2H PRN    CBC:  Recent Labs      06/25/15   0705  06/25/15   2331  06/26/15   0449   WBC  55.6*  30.6*  30.0*   RBC  3.71*  2.99*  2.96*   HGB  9.4*  7.8*  7.8*   HCT  31.6*  26.0*  26.0*   PLT  187  146  136   MCV  85.2  87.1  87.8   MCH  25.3*  26.1  26.3   MCHC  29.7*  30.0*  30.0*   RDW  23.3*  24.1*  24.1*   BANDSPCT  18*  1  1      BMP:  Recent Labs      06/25/15   1647  06/25/15   2331  06/26/15   0449   NA  132*  136  138   K  5.2*  3.2*  3.6   CL  97*  97*  102   CO2  16*  23  23   BUN  44*  40*  38*   CREATININE  1.2*  1.2*  1.0   CALCIUM  7.3*  7.8*  7.5*   GLUCOSE  93  40*  73        Radiology Review:  Pertinent images / reports were reviewed as a part of this visit.     Physical Exam   Constitutional: Awake but confused  HENT: No oropharyngeal exudate.   Eyes: Pupils are equal, round, and reactive to light.   Neck: No JVD present. No tracheal deviation present.    Cardiovascular: regular rhythm, S1&S2 and intact distal pulses.    Pulmonary/Chest: bilateral breath sounds. No wheezes, rhonchi, rales or tenderness.   Abdominal: previous abdominal wounds noted with exposure of intra abdominal fluids   Musculoskeletal: No edema and no tenderness.   Neurological: Non focal.    Assessment:     1. Intra abdominal infection with abscess  2. Sepsis 2/2 above  3. AKI  4. Hx of colorectal ca  Plan:     VS reviewed as above. Stress ulcer, DVT, and line protocols are being followed if not contraindicated.  Discussed with surgery, plan for IV access for TPN and perc drainage per IR. She might need general anesthesia for this to happen and I explained that to patient  Continue IVF  Continue abx  Pain control  Discussed with ICU team      Total critical care time caring for this patient with life threatening illness, including direct patient contact, management of life support systems, review of data including imaging and labs, discussions with other team members and physicians is at least 31 minutes so far today, excluding procedures.  ??  ??

## 2015-06-26 NOTE — Progress Notes (Signed)
Pt taken to IR after intubation for placement of central venous catheter and drainage of abscess.  Pt tolerated procedures and transfer well, VS remained stable, levophed drip titrated as needed during procedure.  Drain placed to pt left lower quadrant draining similar drainage as abdominal wounds, drain connecter to JP bulb for transport then connected to LIWS after return to room.  Drain to be to suction until drainage subsides then return to JP bulb.

## 2015-06-26 NOTE — Progress Notes (Signed)
Received panic Glucose of 40. POCT 48 obtained. Paged hospitalist. See new orders. Lenard Forth

## 2015-06-26 NOTE — Brief Op Note (Signed)
Brief Postoperative Note    Vedanshi Higginbotham  Date of Birth:  06-28-1970  [id number]    Pre-operative Diagnosis: Abdominal abscess.  Need for IV access    Post-operative Diagnosis: Same    Procedure:1.   Central venous line placement.  2.  LLQ abscess drainage.    Anesthesia: Deep Sedation-- per ICU protocol.    Surgeons/Assistants: Lambert Mody    Estimated Blood Loss: less than 50     Complications: None    Specimens: Was Obtained: 140 cc purulent material aspirated.      Findings: 1.  Successful right IJ triple lumen cental line placement.  Tip at RA.  Ready for immediate use.  2.  14-F pigtail drain placed to LLQ/ pelvis,  posterior to bladder into fecal collection.  Electronically signed by Alvino Blood, MD on 06/26/2015 at 4:00 PM

## 2015-06-26 NOTE — Progress Notes (Signed)
Pt intubated with 8.0ETT 24cm at lip positive color change. ACVC 14/400/peep 5/100% O2,  SpO2 98%, pt tolerated well will continue to follow.

## 2015-06-26 NOTE — Progress Notes (Signed)
Pt assessment complete, see flowsheets for details. VSS. Pt remains A&O x4, confused at times. Drowsy but awakens to verbal stimuli. Continues with abd pain. Dressing to L nephrostomy tube saturated- dressing changed. Site appears clean. Repositioned for comfort. Pt tearful and anxious at times. Will monitor. Erica Harding

## 2015-06-26 NOTE — Progress Notes (Signed)
06/26/15 1936   Vent Information   Mean Airway Pressure 9.4 cmH20   Plateau Pressure 20 cmH20   Static Compliance 27 mL/cmH2O   Dynamic Compliance 25 mL/cmH2O

## 2015-06-26 NOTE — Plan of Care (Signed)
Problem: Infection, Septic Shock:  Goal: Will show no infection signs and symptoms  Will show no infection signs and symptoms   Outcome: Ongoing  Increased lactic acid. Afebrile. Pain 10/10 in abdomen. VSS. Received NS bolus in ED. Additional bolus in ICU.     Problem: Nutrition  Goal: Optimal nutrition therapy  Outcome: Ongoing  Pt currently NPO for bowel rest. Unable to begin TPN d/t lack of central line access.     Problem: Pressure Ulcer:  Goal: Absence of new pressure ulcer  Absence of new pressure ulcer   Outcome: Ongoing  Offered to reposition every 2 hours. Often refused by pt d/t comfort in current position. Pt able to move on own at times.

## 2015-06-26 NOTE — Progress Notes (Signed)
Paged Hospitalist regarding lactic 5.0. See new orders. Will give 1L NS bolus @ 566ml/hr. Updated MD on K 3.2. Ordered not to replace at this time d/t hyperkalemia on admission. Will recheck in AM. Lenard Forth

## 2015-06-26 NOTE — Progress Notes (Signed)
Patient received the following medications intra-procedure:  4ml Lidocaine 1%  0.5ml Sodium Bicarbonate 4%

## 2015-06-26 NOTE — Progress Notes (Signed)
Reassessment complete, no acute changes. Pt continues with drainage around L nephrostomy site. Dressing changed. Pt tolerated fairly well. Pt resting comfortably following dressing change. Remains weak. Lungs clear/diminished. Bowel sounds hypoactive. Abd dressing C/D/I, edges reinforced. Will monitor. Lenard Forth

## 2015-06-26 NOTE — Progress Notes (Signed)
12.5 g Dextrose administered. 89min recheck BS 131. Will monitor. Lenard Forth

## 2015-06-26 NOTE — Progress Notes (Signed)
Contacted pt mother Azion Jalbert, updated on new orders for possible blood transfusion, abscess drainage and placement of central line under fluoroscopy.  Consent granted for blood, mother will contact daughter that is nurse and call back regarding cat scan and IR procedures.

## 2015-06-26 NOTE — Progress Notes (Signed)
[facility]        PATIENT NAME: Erica Harding     TODAY'S DATE: 06/26/2015    CHIEF COMPLAINT:  abd wounds, abd pain     SUBJECTIVE:    Pt in bed, moaning in pain.  Did not hav CT yesterday because could not tolerated contrast, orally, NG attempted unsuccessful x2, unable to place PICC d/t small veins    She complains of abd pain, stool in wounds wound care nurse at bedside     OBJECTIVE:  VITALS:    Visit Vitals   ??? BP 103/71   ??? Pulse 104   ??? Temp 97.7 ??F (36.5 ??C) (Temporal)   ??? Resp 12   ??? Ht 5\' 2"  (1.575 m)   ??? Wt 88 lb 2.9 oz (40 kg)   ??? SpO2 100%   ??? BMI 16.13 kg/m2       INTAKE/OUTPUT:    I/O last 3 completed shifts:  In: [id number] [I.V.:1356; IV Piggyback:250]  Out: 825 [Urine:625; Stool:200]       Drain/tube Output:           CONSTITUTIONAL:  awake and alert  ABDOMEN:  midline abdominal wound open, bowel exposed, fistula with active stool. LLQ wound open with feculent drainage. Stoma pink - no enteric output. Nephrostomy tubes in place.   Skin: no jaundice     Data:  CBC:   Recent Labs      06/25/15   0705  06/25/15   2331  06/26/15   0449   WBC  55.6*  30.6*  30.0*   HGB  9.4*  7.8*  7.8*   HCT  31.6*  26.0*  26.0*   PLT  187  146  136     BMP:    Recent Labs      06/25/15   1647  06/25/15   2331  06/26/15   0449   NA  132*  136  138   K  5.2*  3.2*  3.6   CL  97*  97*  102   CO2  16*  23  23   BUN  44*  40*  38*   CREATININE  1.2*  1.2*  1.0   GLUCOSE  93  40*  73     Hepatic:   Recent Labs      06/25/15   1113  06/25/15   1647  06/25/15   2331   AST  18  34  24   ALT  25  30  29    BILITOT  0.6  0.7  0.6   ALKPHOS  119  143*  122     Mag:      Recent Labs      06/26/15   0449   MG  1.40*      Phos:   No results for input(s): PHOS in the last 72 hours.   INR:   Recent Labs      06/25/15   1113   INR  1.56*       CULTURES:   Anaerobic culture  No results for input(s): LABANAE in the last 72 hours.    Blood culture  No results for input(s): BC in the last 72 hours.    Blood  culture 2  Recent Labs      06/25/15   0705   BLOODCULT2  No Growth to date.  Any change in status will be called.  Body fluid culture  Recent Labs      06/25/15   0705   BLOODCULT2  No Growth to date.  Any change in status will be called.       Surgical culture  No results for input(s): CXSURG in the last 72 hours.    Fecal occult  No results for input(s): OCCULTBLDFEC in the last 72 hours.    Gram stain  No results for input(s): LABGRAM in the last 72 hours.    Stool culture 1  No results for input(s): CXST in the last 72 hours.    Stool culture 2  No results for input(s): STOOLCULT2 in the last 72 hours.    Urine culture  No results for input(s): LABURIN in the last 72 hours.    Wound abscess  No results for input(s): WNDABS in the last 72 hours.      ABX: as ordered      ASSESSMENT/PLAN:      45 y.o. female admitted with   1. Cachexia (HCC)    2. Leukocytosis, unspecified type    3. Postprocedural intra-abdominal sepsis, initial encounter ([place])    4. Dehydration    5. Open wnd ant abdomen-comp, initial encounter    6. Lactic acidosis    7. Hyperkalemia    8. Hypocalcemia    9. Encephalopathies    10. AKI (acute kidney injury) ([place])       Unable to get CT or PICC    Discussed care with Dr. Jamse Belfast in radiology  CL placement in IR  Will plan for CT guided drain today, rectal contrast   TPN, bowel rest  IVF  Replete lytes per medicine  Anemia, no s/s of active bleed, transfuse as needed per medicine    Discussed with patient and nursing.  Discussed with Dr. Rodney Booze, Dr. Jeb Levering, and Dr. Billee Cashing, CNP    Patient placed on vent for procedures today. Her WBC is down with antibiotics. A drain was placed in the LLQ per Dr. Jamse Belfast and is currently to intermittent low wall suction. Appreciate the effort of Lillia Mountain who did a beautiful job of pouching fistulas. This will allow [place] to control drainage and protect the patient's skin. Continue IV antibiotics. TPN to be started tomorrow. Would  begin making arrangements for transfer to [facility].

## 2015-06-26 NOTE — Plan of Care (Signed)
Problem: Restraint Use - Nonviolent/Non-Self-Destructive Behavior:  Goal: Absence of restraint indications  Absence of restraint indications   Outcome: Ongoing  Pt in restraints secondary to sedation/confusion and pulling out of medical lines.  Restraints necessary to keep patient safe from harm.  See restraint flowsheets for documentation.

## 2015-06-26 NOTE — Progress Notes (Signed)
Call placed to attending MD, made aware of vascular assessment, low urinary output and current BP. New order received for NS bolus. Will administer and monitor results.

## 2015-06-26 NOTE — Plan of Care (Signed)
Problem: Risk for Impaired Skin Integrity  Goal: Tissue integrity - skin and mucous membranes  Structural intactness and normal physiological function of skin and  mucous membranes.   Outcome: Ongoing  At risk for skin breakdown. See Braden scale. Remains on bedrest. Patient's skin assessed. Unable to position self in bed due to weakness requires assistance. Turned and repositioned every two hours pt attempts to assist.  Protective barrier placed as needed. Patient kept clean and dry dressings reinforced prn. Pillows used for positioning. Will continue to monitor for skin breakdown        Problem: Falls - Risk of  Goal: Absence of falls  Outcome: Ongoing  Patient placed on fall Precautions per Leamon Arnt Fall Risk Assessment Scale (Score> 45).  Fall risk armband on, orange blanket placed on foot of bed, S.A.F.E. Sign displayed at door.  Bed in locked and low position, bed alarm armed and audible, call light and bedside table in reach.   Q2 monitoring, and toileting performed.     Problem: Venous Thromboembolism:  Goal: Will show no signs or symptoms of venous thromboembolism  Will show no signs or symptoms of venous thromboembolism   Outcome: Ongoing  SCDs in place unable to administer lovenox secondary to open abdominal wounds.    Problem: Pressure Ulcer:  Goal: Signs of wound healing will improve  Signs of wound healing will improve   Outcome: Ongoing  Dressing remain intact to patient coccyx wound and right thigh.

## 2015-06-26 NOTE — Progress Notes (Signed)
Pt to special via travel vent on acvc 14/ 400/ 100%/ peep 5, pt tolerated well and upon arrival to ICU 3312 placed bvack on original vent settings acvc 14/400/ 100%/peep 5.  SpO2 above 90%

## 2015-06-26 NOTE — Procedures (Signed)
Erica Harding is a 45 y.o. female patient.  1. Cachexia (HCC)    2. Leukocytosis, unspecified type    3. Postprocedural intra-abdominal sepsis, initial encounter ([place])    4. Dehydration    5. Open wnd ant abdomen-comp, initial encounter    6. Lactic acidosis    7. Hyperkalemia    8. Hypocalcemia    9. Encephalopathies    10. AKI (acute kidney injury) [facility])      Past Medical History   Diagnosis Date   ??? Colorectal cancer ([place])      Blood pressure 103/71, pulse 109, temperature 97.7 ??F (36.5 ??C), temperature source Temporal, resp. rate 16, height 5\' 2"  (1.575 m), weight 88 lb 2.9 oz (40 kg), SpO2 97 %.    Intubation  Date/Time: 06/26/2015 12:36 PM  Performed by: Brigitte Pulse  Authorized by: Brigitte Pulse   Consent: Verbal consent obtained. Written consent obtained.  Consent given by: patient  Patient understanding: patient states understanding of the procedure being performed  Patient consent: the patient's understanding of the procedure matches consent given  Procedure consent: procedure consent matches procedure scheduled  Relevant documents: relevant documents present and verified  Test results: test results available and properly labeled  Site marked: the operative site was marked  Imaging studies: imaging studies available  Patient identity confirmed: arm band  Time out: Immediately prior to procedure a "time out" was called to verify the correct patient, procedure, equipment, support staff and site/side marked as required.  Indications: respiratory distress and  respiratory failure  Intubation method: direct  Pretreatment medications: fentanyl  Sedatives: propofol  Laryngoscope size: Miller 3  Tube size: 8.0 mm  Tube type: cuffed  Number of attempts: 1  Ventilation between attempts: BVM  Cricoid pressure: no  Cords visualized: yes  Post-procedure assessment: chest rise and ETCO2 monitor  Breath sounds: equal  Cuff inflated: yes  ETT to lip: 24 cm  Tube secured with: ETT holder  Patient tolerance: Patient  tolerated the procedure well with no immediate complications  Comments: CXR ordered           Brigitte Pulse, MD  06/26/2015  12:37 PM

## 2015-06-26 NOTE — Progress Notes (Signed)
[place] [facility] PROGRESS NOTE    06/26/2015 5:07 PM     Name: Erica Harding .              Admitted: 06/25/2015  Primary Care Provider: Referring Not In System (Inactive) (Tel: None)                                                                                                                                          Subjective:  [name]    Pt seen and examined   Labs and records reviewed     Admitted due to open abdominal wounds and peritonitis   Currently with better controlled pain     Reviewed interval ancillary notes    Current Medications    glucose (GLUTOSE) 40 % oral gel 15 g PRN   dextrose 50 % solution 12.5 g PRN   glucagon (rDNA) injection 1 mg PRN   dextrose 5 % solution PRN   propofol 1000 MG/100ML injection Titrated   propofol 1000 MG/100ML injection    fentaNYL (SUBLIMAZE) 100 MCG/2ML injection    albuterol sulfate HFA 108 (90 BASE) MCG/ACT inhaler 6 puff Q4H PRN   ipratropium (ATROVENT HFA) 17 MCG/ACT inhaler 6 puff Q4H PRN   PN-ADULT PREMIXED (CENTRAL) SOLUTION WITH ELECTROLYTES Continuous TPN   [START ON 07/03/2015] fat emulsion 20 % infusion 250 mL Once per day on Mon Thu   insulin lispro (HUMALOG) injection pen 0-6 Units Q4H   thiamine (B-1) injection 300 mg Daily   sodium chloride flush 0.9 % injection 10 mL 2 times per day   sodium chloride flush 0.9 % injection 10 mL PRN   potassium chloride 10 mEq/100 mL IVPB (Peripheral Line) PRN   potassium chloride 20 mEq/50 mL IVPB (Central Line) PRN   magnesium sulfate 1 g in dextrose 5% 100 mL IVPB premix PRN   acetaminophen (TYLENOL) tablet 650 mg Q4H PRN   ondansetron (ZOFRAN) injection 4 mg Q6H PRN   famotidine (PEPCID) injection 20 mg Daily   enoxaparin (LOVENOX) injection 30 mg Daily   fluconazole (DIFLUCAN) 200 mg in 0.9 % NaCl 100 mL premix IVPB Q24H   metronidazole (FLAGYL) 500 mg in NaCl 100 mL IVPB premix Q8H   piperacillin-tazobactam (ZOSYN) 3.375 g in dextrose 50 mL IVPB  extended infusion (premix) Q8H   sodium chloride flush 0.9 % injection 10 mL 2 times per day   sodium chloride flush 0.9 % injection 10 mL PRN   vancomycin (VANCOCIN) 500 mg in dextrose 5% 100 mL IVPB Q24H   fentaNYL (DURAGESIC) 25 MCG/HR 1 patch Q72H   HYDROmorphone (DILAUDID) injection 0.5 mg Q2H PRN   Or    HYDROmorphone (DILAUDID) injection 1 mg Q2H PRN       Objective:  Visit Vitals   ??? BP (!) 78/53   ??? Pulse 72   ??? Temp 97.7 ??F (36.5 ??C) (Temporal)   ???  Resp 14   ??? Ht 5\' 2"  (1.575 m)   ??? Wt 88 lb 2.9 oz (40 kg)   ??? SpO2 94%   ??? BMI 16.13 kg/m2           Intake/Output Summary (Last 24 hours) at 06/26/15 1707  Last data filed at 06/26/15 [id number]   Gross per 24 hour   Intake             1513 ml   Output              475 ml   Net             1038 ml     Wt Readings from Last 3 Encounters:   06/26/15 88 lb 2.9 oz (40 kg)         General appearance:  Appears distress, cachectic   Eyes: Sclera clear, pupils equal  ENT: Moist mucus membranes, no thrush. Trachea midline.  Cardiovascular: Regular rhythm, normal S1, S2. No murmur, gallop, rub. No edema in lower extremities  Respiratory: Clear to auscultation bilaterally, no wheeze, good inspiratory effort  Gastrointestinal: multiple open wounds with clean dressings   Musculoskeletal: No cyanosis in digits, neck supple  Neurology: Cranial nerves grossly intact. Alert and oriented in time, place and person. No speech or motor deficits  Psychiatry: Appropriate affect. Not agitated  Skin: Warm, dry, normal turgor, no rash      Labs and Tests:  CBC:   Recent Labs      06/25/15   2331  06/26/15   0449  06/26/15   1110   WBC  30.6*  30.0*  31.4*   HGB  7.8*  7.8*  7.9*   PLT  146  136  122*     BMP:  Recent Labs      06/25/15   2331  06/26/15   0449  06/26/15   1110   NA  136  138  138   K  3.2*  3.6  2.9*   CL  97*  102  99   CO2  23  23  20*   BUN  40*  38*  37*   CREATININE  1.2*  1.0  1.1   GLUCOSE  40*  73  96     Hepatic: Recent Labs      06/25/15   1647  06/25/15   2331   06/26/15   1110   AST  34  24  27   ALT  30  29  34   BILITOT  0.7  0.6  0.5   ALKPHOS  143*  122  126     INR:   Recent Labs      06/25/15   1113   INR  1.56*       Discussed care with ICU RN     Discussed POC with Dr. 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  ----------------------  ??  Admit to ICU for IVF resuscitation and supportive care   GS and intensivist have been consulted   Will proceed with elective intubation to facilitate drainage of pelvis abscess   Continue antibiotics   Cultures pending   Correction of electrolytes.   Palliative care consult -- pt is hospice appropriate however family and pt seem oblivious to poor prognosis   Pain control w fentanyl patch and IV dilaudid   ??                                              Diet: Diet NPO Effective Now  PN-ADULT PREMIXED (CENTRAL) SOLUTION WITH ELECTROLYTES  Prophylaxis:   DVT: lovenox    GI: pepcid   Code:Full Code  Disposition: ICU      Total critical care time was 34 minutes, excluding separately reportable procedures. Services included in critical care time were chart data review, documentation time, obtaining information from patient, review of nursing notes, and vital sign assessment. There was a high probability of clinically significant life-threatening deterioration in the patient's condition, which required my urgent intervention.          Brigitte Pulse, MD   06/26/2015 5:07 PM

## 2015-06-26 NOTE — Progress Notes (Signed)
Shift assessment complete and as charted in doc flow. Pt intubated and sedated at this time, tolerating ventilator well at current settings: A/C 14, 400, 100%, 5, RASS -2. VSS, afebrile, regular HR, NSR on cardiac monitor. IVF infusing without difficulty with ordered propofol, TPN, antibiotics, levophed, and potassium replacement at documented rates-see eMAR. Pt tolerating infusion well. Foley cathter in place to bedside with minimal amount UO, amber, sediment noted. Abdominal incision x2 intact with drainage, bilateral nephrostomy tubes draining to gravity with minimal output, ostomy to RLQ with no output noted. SCDs in place to BLE. Right foot cold with cyanosis noted to toes 2-5, capillary refill >3 seconds. Will notify attending MD. No family at bedside at time of handoff or initial shift assessment. Pt repositioned in bed with pillow support for comfort and skin integrity-appears comfortable. Call light is within reach. Bed alarm engaged. Will continue to monitor.  ??

## 2015-06-27 ENCOUNTER — Inpatient Hospital Stay: Admit: 2015-06-27 | Primary: Internal Medicine

## 2015-06-27 DIAGNOSIS — R64 Cachexia: Secondary | ICD-10-CM

## 2015-06-27 LAB — CBC WITH AUTO DIFFERENTIAL
Bands Relative: 16 % — ABNORMAL HIGH (ref 0–7)
Basophils %: 0 %
Basophils Absolute: 0 10*3/uL (ref 0.0–0.2)
Eosinophils %: 0 %
Eosinophils Absolute: 0 10*3/uL (ref 0.0–0.6)
Hematocrit: 28.4 % — ABNORMAL LOW (ref 36.0–48.0)
Hemoglobin: 8.2 g/dL — ABNORMAL LOW (ref 12.0–16.0)
Lymphocytes %: 5 %
Lymphocytes Absolute: 1.7 10*3/uL (ref 1.0–5.1)
MCH: 25.3 pg — ABNORMAL LOW (ref 26.0–34.0)
MCHC: 28.9 g/dL — ABNORMAL LOW (ref 31.0–36.0)
MCV: 87.8 fL (ref 80.0–100.0)
MPV: 8.6 fL (ref 5.0–10.5)
Metamyelocytes Relative: 3 % — AB
Monocytes %: 1 %
Monocytes Absolute: 0.3 10*3/uL (ref 0.0–1.3)
Neutrophils %: 75 %
Neutrophils Absolute: 32.7 10*3/uL — ABNORMAL HIGH (ref 1.7–7.7)
Platelets: 101 10*3/uL — ABNORMAL LOW (ref 135–450)
RBC: 3.24 M/uL — ABNORMAL LOW (ref 4.00–5.20)
RDW: 24.9 % — ABNORMAL HIGH (ref 12.4–15.4)
WBC: 34.8 10*3/uL — ABNORMAL HIGH (ref 4.0–11.0)
nRBC: 1 /100 WBC — AB

## 2015-06-27 LAB — COMPREHENSIVE METABOLIC PANEL
ALT: 29 U/L (ref 10–40)
AST: 21 U/L (ref 15–37)
Albumin/Globulin Ratio: 0.4 — ABNORMAL LOW (ref 1.1–2.2)
Albumin: 1.3 g/dL — ABNORMAL LOW (ref 3.4–5.0)
Alkaline Phosphatase: 147 U/L — ABNORMAL HIGH (ref 40–129)
Anion Gap: 12 (ref 3–16)
BUN: 35 mg/dL — ABNORMAL HIGH (ref 7–20)
CO2: 18 mmol/L — ABNORMAL LOW (ref 21–32)
Calcium: 7.6 mg/dL — ABNORMAL LOW (ref 8.3–10.6)
Chloride: 110 mmol/L (ref 99–110)
Creatinine: 1 mg/dL (ref 0.6–1.1)
GFR African American: >60 (ref >60)
GFR Non-African American: >60 (ref >60)
Globulin: 3.1 g/dL
Glucose: 202 mg/dL — ABNORMAL HIGH (ref 70–99)
Potassium: 4.5 mmol/L (ref 3.5–5.1)
Sodium: 140 mmol/L (ref 136–145)
Total Bilirubin: 0.4 mg/dL (ref 0.0–1.0)
Total Protein: 4.4 g/dL — ABNORMAL LOW (ref 6.4–8.2)

## 2015-06-27 LAB — POCT GLUCOSE
POC Glucose: 95 mg/dl (ref 70–99)
POC Glucose: 142 mg/dl — ABNORMAL HIGH (ref 70–99)
POC Glucose: 169 mg/dl — ABNORMAL HIGH (ref 70–99)
POC Glucose: 178 mg/dl — ABNORMAL HIGH (ref 70–99)
POC Glucose: 178 mg/dl — ABNORMAL HIGH (ref 70–99)
POC Glucose: 111 mg/dl — ABNORMAL HIGH (ref 70–99)

## 2015-06-27 LAB — PREPARE RBC (CROSSMATCH): Dispense Status Blood Bank: TRANSFUSED

## 2015-06-27 LAB — ABO/RH: ABO/Rh: B POS

## 2015-06-27 LAB — CALCIUM, IONIZED
Calcium, Ionized: 1.09 mmol/L — ABNORMAL LOW (ref 1.12–1.32)
pH, Ven: 7.35 (ref 7.35–7.45)

## 2015-06-27 LAB — PHOSPHORUS: Phosphorus: 3 mg/dL (ref 2.5–4.9)

## 2015-06-27 LAB — MAGNESIUM: Magnesium: 2.1 mg/dL (ref 1.80–2.40)

## 2015-06-27 MED ORDER — SODIUM CHLORIDE 0.9 % IV BOLUS
0.9 % | Freq: Once | INTRAVENOUS | Status: AC
Start: 2015-06-27 — End: 2015-06-27
  Administered 2015-06-27: 03:00:00 500 mL via INTRAVENOUS

## 2015-06-27 MED ORDER — MAGNESIUM HYDROXIDE 400 MG/5ML PO SUSP
400 MG/5ML | Freq: Every day | ORAL | Status: DC | PRN
Start: 2015-06-27 — End: 2015-07-04

## 2015-06-27 MED ORDER — HEPARIN SOD (PORCINE) IN D5W 100 UNIT/ML IV SOLN
100 UNIT/ML | INTRAVENOUS | Status: DC
Start: 2015-06-27 — End: 2015-06-27

## 2015-06-27 MED ORDER — ONDANSETRON HCL 4 MG/2ML IJ SOLN
4 MG/2ML | Freq: Four times a day (QID) | INTRAMUSCULAR | Status: DC | PRN
Start: 2015-06-27 — End: 2015-06-27

## 2015-06-27 MED ORDER — HEPARIN SODIUM (PORCINE) 1000 UNIT/ML IJ SOLN
1000 | INTRAMUSCULAR | Status: AC
Start: 2015-06-27 — End: 2015-06-27

## 2015-06-27 MED ORDER — LIDOCAINE HCL (PF) 1 % IJ SOLN
1 | INTRAMUSCULAR | Status: AC
Start: 2015-06-27 — End: 2015-06-27

## 2015-06-27 MED ORDER — SODIUM CHLORIDE 0.9 % IV SOLN
0.9 | INTRAVENOUS | Status: AC
Start: 2015-06-27 — End: 2015-06-27

## 2015-06-27 MED ORDER — HEPARIN (PORCINE) IN NACL 2-0.9 UNIT/ML-% IJ SOLN
2-0.9 | INTRAMUSCULAR | Status: AC
Start: 2015-06-27 — End: 2015-06-27

## 2015-06-27 MED ORDER — HEPARIN SODIUM (PORCINE) 1000 UNIT/ML IJ SOLN
1000 UNIT/ML | INTRAMUSCULAR | Status: DC | PRN
Start: 2015-06-27 — End: 2015-06-27

## 2015-06-27 MED ORDER — NORMAL SALINE FLUSH 0.9 % IV SOLN
0.9 % | INTRAVENOUS | Status: DC | PRN
Start: 2015-06-27 — End: 2015-06-27

## 2015-06-27 MED ORDER — IOPAMIDOL 76 % IV SOLN
76 % | Freq: Once | INTRAVENOUS | Status: AC | PRN
Start: 2015-06-27 — End: 2015-06-27
  Administered 2015-06-27: 17:00:00 100 mL via INTRAVENOUS

## 2015-06-27 MED ORDER — SODIUM CHLORIDE 0.9 % IV SOLN
0.9 % | INTRAVENOUS | Status: AC
Start: 2015-06-27 — End: 2015-06-27

## 2015-06-27 MED ORDER — HEPARIN SOD (PORCINE) IN D5W 100 UNIT/ML IV SOLN
100 | INTRAVENOUS | Status: AC
Start: 2015-06-27 — End: 2015-06-27

## 2015-06-27 MED ORDER — PROTAMINE SULFATE 10 MG/ML IV SOLN
10 | INTRAVENOUS | Status: AC
Start: 2015-06-27 — End: 2015-06-27

## 2015-06-27 MED ORDER — SODIUM CHLORIDE 0.9 % IV SOLN
0.9 % | INTRAVENOUS | Status: AC
Start: 2015-06-27 — End: 2015-06-27
  Administered 2015-06-27: 15:00:00 500

## 2015-06-27 MED ORDER — CLINIMIX E/DEXTROSE (5/20) 5 % IV SOLN
5 % | INTRAVENOUS | Status: AC
Start: 2015-06-27 — End: 2015-06-28
  Administered 2015-06-27: 22:00:00 via INTRAVENOUS

## 2015-06-27 MED ORDER — SODIUM CHLORIDE 0.9 % IV SOLN
0.9 % | Freq: Once | INTRAVENOUS | Status: AC
Start: 2015-06-27 — End: 2015-06-27
  Administered 2015-06-28: 01:00:00 via INTRAVENOUS

## 2015-06-27 MED ORDER — SODIUM CHLORIDE 0.9 % IV BOLUS
0.9 % | Freq: Once | INTRAVENOUS | Status: AC | PRN
Start: 2015-06-27 — End: 2015-06-27

## 2015-06-27 MED ORDER — FENTANYL 0.05 MG/ML SOLN (MIXTURES ONLY)
0.9 % | INTRAVENOUS | Status: DC
Start: 2015-06-27 — End: 2015-06-29

## 2015-06-27 MED ORDER — SODIUM CHLORIDE 0.9 % IV SOLN
0.9 % | INTRAVENOUS | Status: AC
Start: 2015-06-27 — End: 2015-06-26
  Administered 2015-06-27: 01:00:00 250

## 2015-06-27 MED ORDER — NORMAL SALINE FLUSH 0.9 % IV SOLN
0.9 % | Freq: Two times a day (BID) | INTRAVENOUS | Status: DC
Start: 2015-06-27 — End: 2015-06-27

## 2015-06-27 MED FILL — SODIUM CHLORIDE 0.9 % IV SOLN: 0.9 % | INTRAVENOUS | Qty: 250

## 2015-06-27 MED FILL — FENTANYL CITRATE (PF) 1000 MCG/20ML IJ SOLN: 1000 MCG/20ML | INTRAMUSCULAR | Qty: 20

## 2015-06-27 MED FILL — LIDOCAINE HCL (PF) 1 % IJ SOLN: 1 % | INTRAMUSCULAR | Qty: 30

## 2015-06-27 MED FILL — VANCOMYCIN HCL IN DEXTROSE 500-5 MG/100ML-% IV SOLN: 500-5 MG/100ML-% | INTRAVENOUS | Qty: 100

## 2015-06-27 MED FILL — LOVENOX 30 MG/0.3ML SC SOLN: 30 MG/0.3ML | SUBCUTANEOUS | Qty: 0.3

## 2015-06-27 MED FILL — SODIUM CHLORIDE 0.9 % IV SOLN: 0.9 % | INTRAVENOUS | Qty: 500

## 2015-06-27 MED FILL — ISOVUE-370 76 % IV SOLN: 76 % | INTRAVENOUS | Qty: 100

## 2015-06-27 MED FILL — FAMOTIDINE 20 MG/2ML IV SOLN: 20 MG/2ML | INTRAVENOUS | Qty: 2

## 2015-06-27 MED FILL — PROPOFOL 1000 MG/100ML IV EMUL: 1000 MG/100ML | INTRAVENOUS | Qty: 100

## 2015-06-27 MED FILL — ZOSYN 3-0.375 GM/50ML IV SOLN: INTRAVENOUS | Qty: 50

## 2015-06-27 MED FILL — HYDROMORPHONE HCL 1 MG/ML IJ SOLN: 1 MG/ML | INTRAMUSCULAR | Qty: 1

## 2015-06-27 MED FILL — CLINIMIX E/DEXTROSE (5/20) 5 % IV SOLN: 5 % | INTRAVENOUS | Qty: 1000

## 2015-06-27 MED FILL — HEPARIN (PORCINE) IN NACL 2-0.9 UNIT/ML-% IJ SOLN: INTRAMUSCULAR | Qty: 1000

## 2015-06-27 MED FILL — METRONIDAZOLE IN NACL 5-0.79 MG/ML-% IV SOLN: 5 mg/mL | INTRAVENOUS | Qty: 100

## 2015-06-27 MED FILL — HEPARIN SODIUM (PORCINE) 1000 UNIT/ML IJ SOLN: 1000 UNIT/ML | INTRAMUSCULAR | Qty: 10

## 2015-06-27 MED FILL — SODIUM CHLORIDE 0.9 % IV SOLN: 0.9 % | INTRAVENOUS | Qty: 1000

## 2015-06-27 MED FILL — THIAMINE HCL 100 MG/ML IJ SOLN: 100 MG/ML | INTRAMUSCULAR | Qty: 3

## 2015-06-27 MED FILL — HEPARIN SOD (PORCINE) IN D5W 100 UNIT/ML IV SOLN: 100 UNIT/ML | INTRAVENOUS | Qty: 250

## 2015-06-27 MED FILL — PROTAMINE SULFATE 10 MG/ML IV SOLN: 10 MG/ML | INTRAVENOUS | Qty: 5

## 2015-06-27 MED FILL — FLUCONAZOLE IN SODIUM CHLORIDE 200-0.9 MG/100ML-% IV SOLN: INTRAVENOUS | Qty: 100

## 2015-06-27 MED FILL — NORMAL SALINE FLUSH 0.9 % IV SOLN: 0.9 % | INTRAVENOUS | Qty: 30

## 2015-06-27 NOTE — Other (Signed)
Dr Lucendia Herrlich called regarding baseline PTT 66; order to start Heparin @ 12 units/kg/hr 1 hour post sheath removal.

## 2015-06-27 NOTE — Progress Notes (Signed)
[place] [facility] PROGRESS NOTE    06/27/2015 3:03 PM     Name: Erica Harding .              Admitted: 06/25/2015  Primary Care Provider: Referring Not In System (Inactive) (Tel: None)                                                                                                                                          Subjective:  Erica Harding    Pt seen and examined   Labs and records reviewed     Admitted due to open abdominal wounds and peritonitis   Got intubated for procedures  Currently intubated and sedated     Reviewed interval ancillary notes    Current Medications    PN-ADULT PREMIXED (CENTRAL) SOLUTION WITH ELECTROLYTES Continuous TPN   fentaNYL (SUBLIMAZE) 1,000 mcg in sodium chloride 0.9 % 100 mL infusion Continuous   glucose (GLUTOSE) 40 % oral gel 15 g PRN   dextrose 50 % solution 12.5 g PRN   glucagon (rDNA) injection 1 mg PRN   dextrose 5 % solution PRN   propofol 1000 MG/100ML injection Titrated   albuterol sulfate HFA 108 (90 BASE) MCG/ACT inhaler 6 puff Q4H PRN   ipratropium (ATROVENT HFA) 17 MCG/ACT inhaler 6 puff Q4H PRN   PN-ADULT PREMIXED (CENTRAL) SOLUTION WITH ELECTROLYTES Continuous TPN   [START ON 07/03/2015] fat emulsion 20 % infusion 250 mL Once per day on Mon Thu   insulin lispro (HUMALOG) injection pen 0-6 Units Q4H   thiamine (B-1) 300 mg in sodium chloride 0.9 % 50 mL IVPB Daily   sodium chloride flush 0.9 % injection 10 mL 2 times per day   sodium chloride flush 0.9 % injection 10 mL PRN   potassium chloride 10 mEq/100 mL IVPB (Peripheral Line) PRN   potassium chloride 20 mEq/50 mL IVPB (Central Line) PRN   magnesium sulfate 1 g in dextrose 5% 100 mL IVPB premix PRN   acetaminophen (TYLENOL) tablet 650 mg Q4H PRN   ondansetron (ZOFRAN) injection 4 mg Q6H PRN   famotidine (PEPCID) injection 20 mg Daily   enoxaparin (LOVENOX) injection 30 mg Daily   fluconazole (DIFLUCAN) 200 mg in 0.9 % NaCl 100 mL premix IVPB Q24H    metronidazole (FLAGYL) 500 mg in NaCl 100 mL IVPB premix Q8H   piperacillin-tazobactam (ZOSYN) 3.375 g in dextrose 50 mL IVPB extended infusion (premix) Q8H   sodium chloride flush 0.9 % injection 10 mL 2 times per day   sodium chloride flush 0.9 % injection 10 mL PRN   vancomycin (VANCOCIN) 500 mg in dextrose 5% 100 mL IVPB Q24H   fentaNYL (DURAGESIC) 25 MCG/HR 1 patch Q72H   HYDROmorphone (DILAUDID) injection 0.5 mg Q2H PRN   Or    HYDROmorphone (DILAUDID) injection 1 mg Q2H PRN       Objective:  Visit Vitals   ???  BP (!) 88/58   ??? Pulse 101   ??? Temp 96.4 ??F (35.8 ??C)   ??? Resp 18   ??? Ht 5\' 2"  (1.575 m)   ??? Wt 87 lb 4.8 oz (39.6 kg)   ??? SpO2 100%   ??? BMI 15.97 kg/m2           Intake/Output Summary (Last 24 hours) at 06/27/15 1503  Last data filed at 06/27/15 0548   Gross per 24 hour   Intake          2412.61 ml   Output             2150 ml   Net           262.61 ml     Wt Readings from Last 3 Encounters:   06/27/15 87 lb 4.8 oz (39.6 kg)         General appearance:  Intubated and sedated   Eyes: Sclera clear, pupils equal  ENT: Moist mucus membranes, no thrush. Trachea midline.  Cardiovascular: Regular rhythm, normal S1, S2. No murmur, gallop, rub. No edema in lower extremities  Respiratory: Clear to auscultation bilaterally, no wheeze, good inspiratory effort  Gastrointestinal: multiple open wounds with clean dressings   Musculoskeletal: No cyanosis in digits, neck supple  Neurology: sedated     Skin: Warm, dry, normal turgor, no rash      Labs and Tests:  CBC:   Recent Labs      06/26/15   0449  06/26/15   1110  06/27/15   0540   WBC  30.0*  31.4*  34.8*   HGB  7.8*  7.9*  8.2*   PLT  136  122*  101*     BMP:    Recent Labs      06/26/15   0449  06/26/15   1110  06/27/15   0540   NA  138  138  140   K  3.6  2.9*  4.5   CL  102  99  110   CO2  23  20*  18*   BUN  38*  37*  35*   CREATININE  1.0  1.1  1.0   GLUCOSE  73  96  202*     Hepatic:   Recent Labs      06/25/15   2331  06/26/15   1110  06/27/15   0540   AST   24  27  21    ALT  29  34  29   BILITOT  0.6  0.5  0.4   ALKPHOS  122  126  147*     INR:   Recent Labs      06/25/15   1113   INR  1.56*       Discussed care with ICU RN     Discussed POC with Dr. Jeb Levering and ICU team      I viewed CXR images and EKG tracings      Problem List  Active Problems:    AKI (acute kidney injury) ([place])    Hyperkalemia    Open wnd ant abdomen-comp    Postprocedural intra-abdominal sepsis ([place])    Fistula    Ischemic foot       Assessment & Plan:     Severe sepsis 2/2 peritonitis -- presented w sepsis syndrome and noted to have multiple abdominal wounds open -- continue conservative management w IV antibiotics, bowel rest, TPN, wound care    General  surgeon has been consulted    Poss to repeat CT on Monday    Will consult ID        Suspected LE ischemia/ gangrene -- noted today with L 2-4 toes w black discoloration and all toes discoloration on RLE    Vascular surgeon has been consulted    CTA obtained    Will poss go for A-F bypass w stent    AC after     AKI --POA -- resolved     Acute respiratory failure -- pt electively intubated for procedures strictly    ETT 1   Continue mechanical ventilation until all procedures done    Pulmonologist following     Severe CMP -- will continue TPN     PT/OT  Palliative care                                           Diet: Diet NPO Effective Now  PN-ADULT PREMIXED (CENTRAL) SOLUTION WITH ELECTROLYTES  PN-ADULT PREMIXED (CENTRAL) SOLUTION WITH ELECTROLYTES  Prophylaxis:   DVT: lovenox    GI: pepcid   Code:Full Code  Disposition: ICU      Total critical care time was 32 minutes, excluding separately reportable procedures. Services included in critical care time were chart data review, documentation time, obtaining information from patient, review of nursing notes, and vital sign assessment. There was a high probability of clinically significant life-threatening deterioration in the patient's condition, which required my urgent  intervention.          Brigitte Pulse, MD   06/27/2015 3:03 PM

## 2015-06-27 NOTE — Consults (Signed)
Vascular Surgery   Consult Note      Chief Complaint: pt intubated on ventilator; consult for cold feet    History obtained from: EPIC    History of Present Illness :    Padme Popham is a 45 y.o. 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 Her clinical course was complicated by a rectovaginal fistula status post repair along with ileostomy takedown, end colostomy creation, jejunal injury s/p small bowel resection, and multiple abdominal abscesses requiring IR drainage catheters; one of which caused an iatrogenic bladder injury. She was under the care of [facility] but moved to this area to be closer to her son. On 2/17, she presented with a pelvic abscess with direct extension into the abdominal wall for which she underwent an I&D. 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 She had a leukocytosis of 30k on admission that is up to 34.8k today. She was electively intubated yesterday for central line placement and CT scan. She was on levophed for a short time after intubation yesterday. Her BP has been soft today with SBP 80-90. Per nursing, her toes on bilateral feet turned blued last night around 10 pm. Per EPIC, the patient is a non-smoker.     Past Medical History:        Diagnosis Date   ??? Colorectal cancer [facility])        Past Surgical History:           Procedure Laterality Date   ??? Colonoscopy     ??? Abdomen surgery     ??? Hysterectomy         Allergies:  Latex and Compazine [prochlorperazine maleate]    Medications:   Home Meds  No current facility-administered medications on file prior to encounter.      No current outpatient prescriptions on file prior to encounter.     Current Meds    PN-ADULT PREMIXED (CENTRAL) SOLUTION WITH ELECTROLYTES Continuous TPN   glucose (GLUTOSE) 40 % oral gel 15 g PRN   dextrose 50 %  solution 12.5 g PRN   glucagon (rDNA) injection 1 mg PRN   dextrose 5 % solution PRN   propofol 1000 MG/100ML injection Titrated   albuterol sulfate HFA 108 (90 BASE) MCG/ACT inhaler 6 puff Q4H PRN   ipratropium (ATROVENT HFA) 17 MCG/ACT inhaler 6 puff Q4H PRN   PN-ADULT PREMIXED (CENTRAL) SOLUTION WITH ELECTROLYTES Continuous TPN   [START ON 07/03/2015] fat emulsion 20 % infusion 250 mL Once per day on Mon Thu   insulin lispro (HUMALOG) injection pen 0-6 Units Q4H   thiamine (B-1) 300 mg in sodium chloride 0.9 % 50 mL IVPB Daily   sodium chloride flush 0.9 % injection 10 mL 2 times per day   sodium chloride flush 0.9 % injection 10 mL PRN   potassium chloride 10 mEq/100 mL IVPB (Peripheral Line) PRN   potassium chloride 20 mEq/50 mL IVPB (Central Line) PRN   magnesium sulfate 1 g in dextrose 5% 100 mL IVPB premix PRN   acetaminophen (TYLENOL) tablet 650 mg Q4H PRN   ondansetron (ZOFRAN) injection 4 mg Q6H PRN   famotidine (PEPCID) injection 20 mg Daily   enoxaparin (LOVENOX) injection 30 mg Daily   fluconazole (DIFLUCAN) 200 mg in 0.9 % NaCl 100 mL premix IVPB Q24H   metronidazole (FLAGYL) 500 mg in NaCl 100 mL IVPB premix  Q8H   piperacillin-tazobactam (ZOSYN) 3.375 g in dextrose 50 mL IVPB extended infusion (premix) Q8H   sodium chloride flush 0.9 % injection 10 mL 2 times per day   sodium chloride flush 0.9 % injection 10 mL PRN   vancomycin (VANCOCIN) 500 mg in dextrose 5% 100 mL IVPB Q24H   fentaNYL (DURAGESIC) 25 MCG/HR 1 patch Q72H   HYDROmorphone (DILAUDID) injection 0.5 mg Q2H PRN   Or    HYDROmorphone (DILAUDID) injection 1 mg Q2H PRN       Family History:   History reviewed. No pertinent family history.    Social History:   TOBACCO:   reports that she has never smoked. She does not have any smokeless tobacco history on file.  ETOH:   reports that she does not drink alcohol.  DRUGS:   reports that she does not use illicit drugs.    ROS: A 10 point review of systems was conducted, significant findings as  noted in HPI.    Physical exam:    Vitals:    06/27/15 1352   BP: (!) 88/58   Pulse: 101   Resp: 18   Temp: 96.4 ??F (35.8 ??C)   SpO2: 100%     General appearance: intubated on ventilator  CVS: RRR  Lung: vent sounds, relatively clear  Abdomen: multiple drains and a large ostomy appliance  Neuro: sedated  Ext: blue toes on bilateral feet, feet cool too touch, no wounds/ulcers   carotid brachial radial femoral popliteal DP PT   LEFT +  + -/+ -/- -/+ -/+   RIGHT +  + -/+ -/- -/- -/-       Labs:    CBC: Recent Labs      06/26/15   0449  06/26/15   1110  06/27/15   0540   WBC  30.0*  31.4*  34.8*   HGB  7.8*  7.9*  8.2*   HCT  26.0*  26.6*  28.4*   MCV  87.8  87.6  87.8   PLT  136  122*  101*     BMP: Recent Labs      06/26/15   0449  06/26/15   1110  06/27/15   0540   NA  138  138  140   K  3.6  2.9*  4.5   CL  102  99  110   CO2  23  20*  18*   PHOS   --    --   3.0   BUN  38*  37*  35*   CREATININE  1.0  1.1  1.0     PT/INR:   Recent Labs      06/25/15   1113   PROTIME  17.6*   INR  1.56*     APTT:   Recent Labs      06/25/15   1113   APTT  49.9*     Liver Profile:  Lab Results   Component Value Date    AST 21 06/27/2015    ALT 29 06/27/2015    BILITOT 0.4 06/27/2015    ALKPHOS 147 06/27/2015   No results found for: CHOL, HDL, TRIG  UA: No results found for: NITRITE, COLORU, PHUR, LABCAST, WBCUA, RBCUA, MUCUS, TRICHOMONAS, YEAST, BACTERIA, CLARITYU, SPECGRAV, LEUKOCYTESUR, UROBILINOGEN, BILIRUBINUR, BLOODU, GLUCOSEU, AMORPHOUS    Assessment/Plan:  45 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 Keep feet wrapped in a warm blanket.       Neldon Mc, MD  06/27/2015 2:05 PM    Vascular Staff    Pt seen and evaluated with resident  Acute occlusion of the right leg with ischemic toes.  Unable to assess neuro status given vent and sedation  No appreciable signals on the right.  CTA  reviewed and shows right EIA occlusion with external drain very near that location.  Occlusion possibly related to external compression.  Have recommended RLE angiogram with percutaneous mechanical thrombectomy  Risks discussed with family and consent obtained    Peter Congo

## 2015-06-27 NOTE — Brief Op Note (Signed)
Brief Postoperative Note    Erica Harding  Date of Birth:  1970/11/08  [id number]    Pre-operative Diagnosis: Acute RLE ischemia    Post-operative Diagnosis: Same    Procedure: 1.  3rd order RLE angiogram  2.  Right EIA/CFA thrombectomy (Angiojet)  3.  Right EIA stent    Anesthesia: Local    Surgeons/Assistants: Lucendia Herrlich    Estimated Blood Loss: less than 50     Complications: None    Specimens: Was Not Obtained    Findings: Right EIA occlusion with small contrast blush     Electronically signed by Ulyses Jarred, MD on 06/27/2015 at 5:03 PM

## 2015-06-27 NOTE — Progress Notes (Signed)
Pt transportecd from cath lab to ICU 12 via travel vent and placed back on mv settings acvc 14/400/ 35%/ peep 5 SpO2 96%, pt tolerated well.

## 2015-06-27 NOTE — Progress Notes (Signed)
Pt transported to Cath Lab, via travel vent with settings acvc14/400/60%/peep5. Pt left with travel vent and RT left number with cath lab to call when finished. Pt SpO2 99%, pt tolerated well.

## 2015-06-27 NOTE — Care Coordination-Inpatient (Signed)
 Wound Ostomy Continence Nurse  Follow-up Progress Note       NAME:  Erica Harding  MEDICAL RECORD NUMBER:  [id number]  AGE:  45 y.o.   GENDER:  female  DOB:  1971/03/04  TODAY'S DATE:  06/27/2015    Subjective:   Pt sedated on vent. Was intubated for procedures yesterday - CT scan and Central line placement.  No output from ostomy. Large fistula manager remains in place, intact. Does have small amount of green to purulent drainage.  Right toes 2-5 are purple, cool to touch. Great toenail is also dusky, cap refill delayed >3 secs.    Left toes cool to touch. Fleeting pedal pulse palpable.  Vascular to see.  Electronically signed by Carollee Herter, CWOCN on 06/27/2015 at 3:05 PM

## 2015-06-27 NOTE — Progress Notes (Signed)
Lower lip is bleeding pt actively biting on tube, washcloth used to clean and moisturizer applied.

## 2015-06-27 NOTE — Progress Notes (Signed)
Pt transported to CT via travel vent settings acvc12/400/60%/peep5, upon return to ICU 12 pt placed on mechanical ventilation with the same settings. Pt SpO2 96%, pt tolerated well.

## 2015-06-27 NOTE — Other (Signed)
Left femoral puncture site intact without hematoma or bleeding; drsg applied. Bilateral DP and PT pulses present with doppler.

## 2015-06-27 NOTE — Consults (Signed)
Infectious Diseases   Consult Note      Reason for Consult: peritonitis  Requesting Physician: Brigitte Pulse, MD     Date of Admission: 06/25/2015  Subjective:   CHIEF COMPLAINT:  None given       HPI:    Erica Harding is a 45yoF with history of colon cancer s/p resection, chemo, radiation.  Previous care has been in [place], [place]  She has had a very complicated course and multiple surgeries     Recent admission 2/17-3/3 with sepsis, peritonitis.  From DC summary,   Erica Harding is a 45 year old female with a complex past medical history including modified posterior pelvic exenteration and HIPEC in 2011 for what turned out to be a perforated rectal adenocarcinoma. 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 She recently presented several weeks prior to admission with recurrent presacral abscess that was treated with IR drainage x2 of both the presacral space and her left abdominal wall. She was ultimately discharged home and was readmitted on 05/31/2015 when she presented with a white count of 38,000 and a CT scan showing interval enlargement of a complex abscess in her presacral space that extended into her abdominal wall and left flank. She was taken to the OR on 05/31/2015 with Oncology Surgery and Urology for incision and drainage of complex abdominal wall abscess, rectal EUA, cystourethroscopy, bilateral retrograde pyelograms and removal old stent and placement of left 6x24 firm stent. She tolerated the procedure well without intraoperative complications. Her postoperative course was complicated by difficult pain control, patient refusal of dressing changes combined with inadequately packed wounds and patient refusal of lovenox. Her pain regimen was modified several times during admission to accomodate patient's desire for increased pain control. Eventually, dressing changes were tolerated by the patient by specific staff members. During intraoperative  pyelogram, contrast was noted to be leaking from the right ureter into the pelvic fluid collection. On recommendations of Urology, patient went for right ureter embolization, right nephrostomy tube exchange, left nephrostomy tube placement and pelvic drain exchange on 06/06/2015. The rest of the patient's hospital stay was unremarkable. She was tolerating a regular diet, ambulating appropriately, tolerating dressing changes, having adequate bowel function and urine output.     She was relocated to live with a family member in this area  She was advised for regular diet but intake was poor   She was admitted through the ER 3/14 with cc weakness, confusion, anorexia           On admission, WBC was 55      CT demonstrates complex fluid collections, possible anastomotic leak.  She also has 2 enterocutaneous fistulae.  Conservative management is recommended by Sgy.  She had percutaneous drainage on 3/14.  Feculent material removed.  She was electively intubated yesterday for pain management and to facilitate procedures  She was briefly on pressors around the tome of intubation     In the last 24 hours, she is noted to have ischemic changes of 2-4 toes on right foot and also left foot   She has been evaluated by Vascular Sgy and will have aortogram today  She is on TPN        Current abx:  vanc  Flagyl  Zosyn   Fluconazole        Past Surgical History:       Diagnosis Date   ??? Colorectal cancer ([place])          Procedure Laterality Date   ???  Colonoscopy     ??? Abdomen surgery     ??? Hysterectomy         Social History:    TOBACCO:   reports that she has never smoked. She does not have any smokeless tobacco history on file.  ETOH:   reports that she does not drink alcohol.  There is no history of illicit drug use or other significant epidemiologic exposures.    Family History:   History reviewed. No pertinent family history.  There is no family history of autoimmune diseases or significant infectious diseases.    Current  Medications:    Current Facility-Administered Medications: PN-ADULT PREMIXED (CENTRAL) SOLUTION WITH ELECTROLYTES, , Intravenous, Continuous TPN  glucose (GLUTOSE) 40 % oral gel 15 g, 15 g, Oral, PRN  dextrose 50 % solution 12.5 g, 12.5 g, Intravenous, PRN  glucagon (rDNA) injection 1 mg, 1 mg, Intramuscular, PRN  dextrose 5 % solution, 100 mL/hr, Intravenous, PRN  propofol 1000 MG/100ML injection, 10 mcg/kg/min, Intravenous, Titrated  albuterol sulfate HFA 108 (90 BASE) MCG/ACT inhaler 6 puff, 6 puff, Inhalation, Q4H PRN  ipratropium (ATROVENT HFA) 17 MCG/ACT inhaler 6 puff, 6 puff, Inhalation, Q4H PRN  PN-ADULT PREMIXED (CENTRAL) SOLUTION WITH ELECTROLYTES, , Intravenous, Continuous TPN  [START ON 07/03/2015] fat emulsion 20 % infusion 250 mL, 250 mL, Intravenous, Once per day on Mon Thu  insulin lispro (HUMALOG) injection pen 0-6 Units, 0-6 Units, Subcutaneous, Q4H  thiamine (B-1) 300 mg in sodium chloride 0.9 % 50 mL IVPB, 300 mg, Intravenous, Daily  sodium chloride flush 0.9 % injection 10 mL, 10 mL, Intravenous, 2 times per day  sodium chloride flush 0.9 % injection 10 mL, 10 mL, Intravenous, PRN  potassium chloride 10 mEq/100 mL IVPB (Peripheral Line), 10 mEq, Intravenous, PRN  potassium chloride 20 mEq/50 mL IVPB (Central Line), 20 mEq, Intravenous, PRN  magnesium sulfate 1 g in dextrose 5% 100 mL IVPB premix, 1 g, Intravenous, PRN  acetaminophen (TYLENOL) tablet 650 mg, 650 mg, Oral, Q4H PRN  ondansetron (ZOFRAN) injection 4 mg, 4 mg, Intravenous, Q6H PRN  famotidine (PEPCID) injection 20 mg, 20 mg, Intravenous, Daily  enoxaparin (LOVENOX) injection 30 mg, 30 mg, Subcutaneous, Daily  fluconazole (DIFLUCAN) 200 mg in 0.9 % NaCl 100 mL premix IVPB, 200 mg, Intravenous, Q24H  metronidazole (FLAGYL) 500 mg in NaCl 100 mL IVPB premix, 500 mg, Intravenous, Q8H  piperacillin-tazobactam (ZOSYN) 3.375 g in dextrose 50 mL IVPB extended infusion (premix), 3.375 g, Intravenous, Q8H  sodium chloride flush 0.9 % injection  10 mL, 10 mL, Intravenous, 2 times per day  sodium chloride flush 0.9 % injection 10 mL, 10 mL, Intravenous, PRN  vancomycin (VANCOCIN) 500 mg in dextrose 5% 100 mL IVPB, 500 mg, Intravenous, Q24H  fentaNYL (DURAGESIC) 25 MCG/HR 1 patch, 1 patch, Transdermal, Q72H  HYDROmorphone (DILAUDID) injection 0.5 mg, 0.5 mg, Intravenous, Q2H PRN **OR** HYDROmorphone (DILAUDID) injection 1 mg, 1 mg, Intravenous, Q2H PRN      Allergies   Allergen Reactions   ??? Latex Itching   ??? Compazine [Prochlorperazine Maleate] Swelling        REVIEW OF SYSTEMS:    Unable to obtain         Objective:   PHYSICAL EXAM:      VITALS:    Visit Vitals   ??? BP (!) 77/45   ??? Pulse 115   ??? Temp 97.1 ??F (36.2 ??C)   ??? Resp 27   ??? Ht 5\' 2"  (1.575 m)   ??? Wt 87  lb 4.8 oz (39.6 kg)   ??? SpO2 95%   ??? BMI 15.97 kg/m2        24HR INTAKE/OUTPUT:    Intake/Output Summary (Last 24 hours) at 06/27/15 1215  Last data filed at 06/27/15 0548   Gross per 24 hour   Intake          2412.61 ml   Output             2150 ml   Net           262.61 ml     CONSTITUTIONAL:  Thin, cachectic.  Sedated on vent  HEENT: NCAT, pupils pinpoint, sclera white.  No conjunctival lesions.    NECK:  Supple  LUNGS:  Coarse ventilated breath sounds   CARDIOVASCULAR: RRR  ABDOMEN:  Wound manager with wounds packed   functioning ostomy R side  Perc drain lower left quadrant  MUSCULOSKELETAL: No obvious misalignment or effusion of the joints. No clubbing, cyanosis of the digits.  SKIN: ischemic 2-4 toes R foot, feet are cool   Ischemic changes R plantar heel   ACCESS:  PICC site ok       DATA:    Old records have been reviewed    CBC:  Recent Labs      06/26/15   0449  06/26/15   1110  06/27/15   0540   WBC  30.0*  31.4*  34.8*   RBC  2.96*  3.03*  3.24*   HGB  7.8*  7.9*  8.2*   HCT  26.0*  26.6*  28.4*   PLT  136  122*  101*   MCV  87.8  87.6  87.8   MCH  26.3  26.0  25.3*   MCHC  30.0*  29.6*  28.9*   RDW  24.1*  24.0*  24.9*   NRBC   --    --   1*   BANDSPCT  1  6  16*      BMP:  Recent Labs       06/26/15   0449  06/26/15   1110  06/27/15   0540   NA  138  138  140   K  3.6  2.9*  4.5   CL  102  99  110   CO2  23  20*  18*   BUN  38*  37*  35*   CREATININE  1.0  1.1  1.0   CALCIUM  7.5*  8.4  7.6*   GLUCOSE  73  96  202*        Cultures:   3/14 BC NGTD  3/15 Abscess fluid with mixed flora on gms     Radiology Review:  All pertinent images / reports were reviewed as a part of this visit.       Assessment:     Patient Active Problem List   Diagnosis   ??? AKI (acute kidney injury) ([place])   ??? Hyperkalemia   ??? Open wnd ant abdomen-comp   ??? Postprocedural intra-abdominal sepsis ([place])   ??? Fistula   ??? Ischemic foot       Erica Harding is a 74yoF who is evaluated for the following:    History of colon cancer s/p resection, radiation, chemotherapy  Complicated course with radiation colitis, peritonitis   Recent prolong admission at OSH    Admitted now with sepsis  Feculent peritonitis  Enterocutaneous fistulae  Cachexia   Ischemic changes feet  Will continue broad abx.  May be able to stop the vanc soon   It is not clear this will resolve without surgery  Bowel rest, TPN  Follow cultures    Await vascular evaluation     We will follow            Barry Brunner, [name]    Thank you for the opportunity to participate in the care of your patient.    Please do not hesitate to contact me:   [phone] office  [phone] mobile

## 2015-06-27 NOTE — Consults (Signed)
Palliative Care:      45 year old female with history of colorectal cancer presents to ED with increased confusion, progressive weakness, poor intake from home. Patient noted to have large abdominal wound draining fecal content. Patient recently moved to area from [place] to be with her sister. Patient found to have renal failure and sepsis.Surgeon is following--patient admitted to ICU. WBC 55 K on admission. Started on IV antibiotics for sepsis. Has ischemic foot and post procedural intra abdominal sepsis. Seen by vascular surgeon for CTA of aorta. Wounds draining liquid stool--WOCN following. Patient was electively intubated for procedures yesterday and she did have a central line placed and percutaneous drainage cathter placed.  Patient did have thrombectomy right EIA/CFA and right EIA stent placed today. On Heparin infusion for anticoagulation. Complicated course with radiation colitis, peritonitis, has Feculent peritonitis,  Enterocutaneous fistulae. Vascular is following--Area or distal right foot/toes will soon demarcate and likely need TMA.     Patient has complicated medical history including perforated rectal adenocarcinoma, pelvic surgery complicated with rectovaginal fistula with recurrent pre-sacral abscesses. Patient had surgery 05/31/15 for incision and drainage of complex abdominal wall abscess, rectal EUA, cystourethroscopy, bilateral retrograde pyelograms and removal old stent and placement of left 6x24 firm stent. Patient went for right ureter embolization, right nephrostomy tube exchange, left nephrostomy tube placement and pelvic drain exchange on 06/06/2015. Patient had a recent prolonged hospital stay at OSH.      CT Abdomen and Pelvis: 06/25/15  1. Moderate amount of complex intraperitoneal and extraperitoneal fluid with   multiple locules of air concerning for an infectious process including   possible anastomotic leak in the absence of recent surgery, limited by the   lack of  intravenous and enteric contrast.         Past Medical History:      Colorectal cancer ([place]).   ??  Past Surgical History: Colonoscopy; Abdomen surgery; and Hysterectomy.   ??  Advance Directives:      Code status is full     Problem Severity: Pain/Other Symptoms:      Has chronic abdominal pain and has been on Fentanyl transdermal. Has Hydromorphone available as needed for pain.    Bed Mobility/Toileting/Transfer:    Needs assistance with ADLs      Performance Status:      Weak and deconditioned.     Symptom Assessment: Appetite/Nausea/Bowels/Fatigue:      Patient is malnourished. Dietician is following. Patient is on TPN.     Psychological/Spiritual:      Patient is currently living with her sister. She is a Merchandiser, retail.     Family Discussion:     Left message with sister April. Awaiting return call. Will follow for support.

## 2015-06-27 NOTE — Progress Notes (Signed)
Reassessment complete and as charted in doc flow sheets, see flow sheets for details.  Continues on mechanical ventilation, tolerating well. Bolus infusing per MD orders, will monitor.

## 2015-06-27 NOTE — Progress Notes (Signed)
ACT at 1830 was still 314. Will recheck in two hours.

## 2015-06-27 NOTE — Other (Signed)
Temp 94.1 axillary; Bair Hugger applied.

## 2015-06-27 NOTE — Other (Signed)
ACT 158- left femoral sheath pulled as per order, manual pressure held, maintaining doppler distal pulses.

## 2015-06-27 NOTE — Progress Notes (Signed)
06/27/15 2000   Vent Information   Mean Airway Pressure 9.5 cmH20   Plateau Pressure 22 cmH20   Static Compliance 27 mL/cmH2O   Dynamic Compliance 25 mL/cmH2O   Total PEEP 5.7 cmH20   Auto PEEP 0.7 cmH20

## 2015-06-27 NOTE — Procedures (Signed)
[facility]                       [place]  [address]                                    PROCEDURE NOTE    PATIENT NAME:  Erica Harding, Erica Harding                     DOB:      [birth year]  MED REC NO:    [id number]                       ROOM:     ICF [id number]  ACCOUNT NO:    [id number]                       ADMISSION DATE: 06/25/2015  PHYSICIAN:     Delfino Lovett B. Vick Frees, MD             DATE OF PROCEDURE:  06/27/2015    PREPROCEDURE DIAGNOSIS:  Acute right lower extremity ischemia.    POSTPROCEDURE DIAGNOSIS:  Acute right lower extremity ischemia.    PROCEDURES:  1.  Third order selective right lower extremity angiogram.  2.  Right external iliac and common femoral artery thrombectomy (AngioJet Omni  Solent).  3.  Right external iliac artery PTA (Mustang 5 x 60).  4.  Right external iliac stent (Viabahn 6 x 10).  5.  Right external iliac artery PTA (Sterling 5 x 100).  6.  Completion angiogram.    SURGEON:  Bertram Millard, MD    ANESTHESIA:  Local/IV.    ESTIMATED BLOOD LOSS:  Minimal.    COMPLICATIONS:  None.    INDICATIONS:  The patient is a 45 year old female who was noted to have  ischemia of her right second, third and fourth digits with no appreciable  pulses in her right lower extremity.  CT angiogram revealed complete occlusion  of her right external iliac and common femoral artery.  She has a history of  extensive pelvic surgery as well as radiation.  After discussion with family,  we recommended angiographic evaluation and possible intervention with concerns  for external pressure of her external iliac artery given her frequent  instrumentation.  All risks, benefits, and alternatives were discussed.  All  questions were answered.    DESCRIPTION OF PROCEDURE:  After witnessed informed consent was obtained, the  patient brought to the cath lab where her sedation was continued.  Her left  groin was carefully prepped and draped, 1% lidocaine was infiltrated  locally  into the area overlying the left common femoral artery and, under direct  ultrasound guidance, was entered with a 4-French micropuncture kit.  A  5-French sheath was ultimately placed.  An OmniFlush catheter was used to hook  the aortic bifurcation and a right lower extremity angiogram was performed  showing complete occlusion of her right external iliac artery.  With this then  done, a stiff Glidewire was advanced up and over through the occlusion.  It  was noted to be easily able to pass consistent with soft clot and advanced  down into the SFA.  The OmniFlush and 5-French sheath were removed and a  6-French Pinnacle destination sheath was placed in the left groin up and over  the  bifurcation into the right common iliac artery and 5000 of heparin was  given to the patient.  With this then done, a Glidecath was advanced down into  the distal SFA over the Glidewire.  The Glidewire was removed and a Wholey  wire was then placed.  With this then done, an AngioJet Omni Solent was used  to provide percutaneous mechanical thrombectomy of the external iliac down  into the common femoral artery.  There was complete resolution of the  thrombus; however, on completion angiogram, there did appear to be an area of  blush with contrast extravasation in the proximal external iliac artery.  With  this then done, a Mustang 5 x 60 balloon was placed.  There was some  improvement; however, continued extravasation.  As a result, the Glidecath was  inserted down in the SFA.  The [facility] wire was removed and a V-18 wire was  placed.  Over the V-18 wire, a Viabahn 6 x 10 was placed from the origin of  the external iliac down into the distal external iliac and this was then  postdilated using a Sterling 5 x 100 with excellent angiographic results and  complete resolution of the contrast area of blush.  A short 6-French sheath  was placed in the left groin.  The patient tolerated the procedure well and  was transferred back to the  ICU in guarded condition.    FINDINGS:  The complete occlusion of the right external iliac and proximal  common femoral artery secondary to thrombus.  There did appear to be an area  of contrast extravasation of the proximal external iliac that may be related  to underlying inflammation and trauma from repetitive drain placement.  The  profunda and SFA are patent.  Popliteal is patent.  There are diminutive, but  patent runoff tibial vessels.    PLAN:  The patient will be placed on a heparin drip and anticoagulation as  long as she tolerates.        Keniya Schlotterbeck B. Vick Frees, MD      D: 06/27/2015 17:22:58  T: 06/27/2015 18:27:12  RBF/nts  Job#: [id number]  Doc#: [id number]

## 2015-06-27 NOTE — Plan of Care (Signed)
Problem: Neurological  Goal: Maximum potential motor/sensory/cognitive function  Outcome: Ongoing    Problem: Cardiovascular  Goal: Hemodynamic stability  Outcome: Ongoing    Problem: GU  Goal: Adequate urinary output  Outcome: Ongoing

## 2015-06-27 NOTE — Other (Signed)
Sedated on vent with Propofol @ 10 mcg/kg/min. Arouses easily to verbal stimuli. NS @ 100 ml/hr started per order. Lungs clear.  Right distal foot and toes purple and cold with DP and PT pulses present with doppler. Left foot and toes ruddy with doppler DP and PT pulses. See flow sheet for drains assessment.

## 2015-06-27 NOTE — Consults (Signed)
Discharge planning-    Consult received re: LTAC placement. [facility] is following.    No family in the room. Provided with a list of LTAC facilities, left at the bedside for the family.    Plan- Possible LTAC at discharge. Provided with LTAC list. [facility] already following.    Will continue to follow for support and discharge planning.    -Gershon Mussel, MSW, LSW

## 2015-06-27 NOTE — Progress Notes (Signed)
At beside to check on patient. Right foot still without pulses. There is an order for fentanyl patch but beside RN, Deidre Ala is unable to find this. She is s/p CT which reveals right iliac occlusion, reviewed with Dr. Lucendia Herrlich and Dr. Roanna Raider. Plan is for thrombectomy and anticoagulation with Heparin GTT. If unable to find Fentanyl patch, add Fentanyl GTT for pain control. All of the above communicated with Sande Brothers CNP, General Surgery and Dr. Jeb Levering.     Gaylyn Rong RN MSN ACNP CCRN

## 2015-06-27 NOTE — Progress Notes (Signed)
[facility]  ICU F/U Note    Subjective:   CC / HPI: sepsis, intraabdominal abscess  Patient was electively intubated for procedures yesterday. She did have CVP line placement and perc catheter drainage per IR  She required Levophed for BP support post intubation  Ischemic changes of her LE digits R>L    PMH: Reviewed, no changes    PSH: Reviewed, no changes      Review of Systems  unobtainable    Objective:   Data Review:  Vital Signs:   Visit Vitals   ??? BP (!) 77/45   ??? Pulse 112   ??? Temp 97.1 ??F (36.2 ??C)   ??? Resp 26   ??? Ht 5\' 2"  (1.575 m)   ??? Wt 87 lb 4.8 oz (39.6 kg)   ??? SpO2 100%   ??? BMI 15.97 kg/m2       24 Hour I&O Review:     Intake/Output Summary (Last 24 hours) at 06/27/15 1003  Last data filed at 06/27/15 0548   Gross per 24 hour   Intake          2412.61 ml   Output             2150 ml   Net           262.61 ml     Continuous Infusions:  ??? dextrose     ??? propofol 10 mcg/kg/min (06/27/15 0153)   ??? PN-Adult Premix 5/20 - Standard Electrolytes - Central Line 15 mL/hr at 06/26/15 2121   ??? [START ON 07/03/2015] fat emulsion     ??? sodium chloride       Current Medications: Current Facility-Administered Medications: glucose (GLUTOSE) 40 % oral gel 15 g, 15 g, Oral, PRN  dextrose 50 % solution 12.5 g, 12.5 g, Intravenous, PRN  glucagon (rDNA) injection 1 mg, 1 mg, Intramuscular, PRN  dextrose 5 % solution, 100 mL/hr, Intravenous, PRN  propofol 1000 MG/100ML injection, 10 mcg/kg/min, Intravenous, Titrated  albuterol sulfate HFA 108 (90 BASE) MCG/ACT inhaler 6 puff, 6 puff, Inhalation, Q4H PRN  ipratropium (ATROVENT HFA) 17 MCG/ACT inhaler 6 puff, 6 puff, Inhalation, Q4H PRN  PN-ADULT PREMIXED (CENTRAL) SOLUTION WITH ELECTROLYTES, , Intravenous, Continuous TPN  [START ON 07/03/2015] fat emulsion 20 % infusion 250 mL, 250 mL, Intravenous, Once per day on Mon Thu  insulin lispro (HUMALOG) injection pen 0-6 Units, 0-6 Units, Subcutaneous, Q4H  thiamine (B-1) 300 mg in sodium chloride 0.9 % 50 mL  IVPB, 300 mg, Intravenous, Daily  sodium chloride 0.9 % infusion, , ,   sodium chloride flush 0.9 % injection 10 mL, 10 mL, Intravenous, 2 times per day  sodium chloride flush 0.9 % injection 10 mL, 10 mL, Intravenous, PRN  potassium chloride 10 mEq/100 mL IVPB (Peripheral Line), 10 mEq, Intravenous, PRN  potassium chloride 20 mEq/50 mL IVPB (Central Line), 20 mEq, Intravenous, PRN  magnesium sulfate 1 g in dextrose 5% 100 mL IVPB premix, 1 g, Intravenous, PRN  acetaminophen (TYLENOL) tablet 650 mg, 650 mg, Oral, Q4H PRN  ondansetron (ZOFRAN) injection 4 mg, 4 mg, Intravenous, Q6H PRN  famotidine (PEPCID) injection 20 mg, 20 mg, Intravenous, Daily  enoxaparin (LOVENOX) injection 30 mg, 30 mg, Subcutaneous, Daily  fluconazole (DIFLUCAN) 200 mg in 0.9 % NaCl 100 mL premix IVPB, 200 mg, Intravenous, Q24H  metronidazole (FLAGYL) 500 mg in NaCl 100 mL IVPB premix, 500 mg, Intravenous, Q8H  piperacillin-tazobactam (ZOSYN) 3.375 g in dextrose 50 mL IVPB extended  infusion (premix), 3.375 g, Intravenous, Q8H  sodium chloride flush 0.9 % injection 10 mL, 10 mL, Intravenous, 2 times per day  sodium chloride flush 0.9 % injection 10 mL, 10 mL, Intravenous, PRN  vancomycin (VANCOCIN) 500 mg in dextrose 5% 100 mL IVPB, 500 mg, Intravenous, Q24H  fentaNYL (DURAGESIC) 25 MCG/HR 1 patch, 1 patch, Transdermal, Q72H  HYDROmorphone (DILAUDID) injection 0.5 mg, 0.5 mg, Intravenous, Q2H PRN **OR** HYDROmorphone (DILAUDID) injection 1 mg, 1 mg, Intravenous, Q2H PRN    CBC:  Recent Labs      06/26/15   0449  06/26/15   1110  06/27/15   0540   WBC  30.0*  31.4*  34.8*   RBC  2.96*  3.03*  3.24*   HGB  7.8*  7.9*  8.2*   HCT  26.0*  26.6*  28.4*   PLT  136  122*  101*   MCV  87.8  87.6  87.8   MCH  26.3  26.0  25.3*   MCHC  30.0*  29.6*  28.9*   RDW  24.1*  24.0*  24.9*   NRBC   --    --   1*   BANDSPCT  1  6  16*      BMP:  Recent Labs      06/26/15   0449  06/26/15   1110  06/27/15   0540   NA  138  138  140   K  3.6  2.9*  4.5   CL  102   99  110   CO2  23  20*  18*   BUN  38*  37*  35*   CREATININE  1.0  1.1  1.0   CALCIUM  7.5*  8.4  7.6*   GLUCOSE  73  96  202*        Radiology Review:  Pertinent images / reports were reviewed as a part of this visit.     Physical Exam   Constitutional: Awake  HENT: No oropharyngeal exudate.   Eyes: Pupils are equal, round, and reactive to light.   Neck: No JVD present. No tracheal deviation present.   Cardiovascular: regular rhythm, S1&S2 and intact distal pulses.    Pulmonary/Chest: bilateral breath sounds. No wheezes, rhonchi, rales or tenderness.   Abdominal: previous abdominal wounds noted with exposure of intra abdominal fluids   Musculoskeletal: No edema and no tenderness. ischemic changes of R>L digits with no pulse appreciated on the R foot  Neurological: Non focal.    Assessment:     1. Intra abdominal infection with abscess  2. Sepsis 2/2 above  3. AKI  4. Hx of colorectal ca  5. Ischemic foot, acute   Plan:     VS reviewed as above. Stress ulcer, DVT, and line protocols are being followed if not contraindicated.  STAT vascular consult  Continue vent support for now pending vascular surgery plan  Order 2 D echo  Continue IVF  Continue abx  Discussed with ICU team      Total critical care time caring for this patient with life threatening illness, including direct patient contact, management of life support systems, review of data including imaging and labs, discussions with other team members and physicians is at least 32 minutes so far today, excluding procedures.  ??  ??

## 2015-06-27 NOTE — Progress Notes (Signed)
[facility]        PATIENT NAME: Erica Harding     TODAY'S DATE: 06/27/2015    CHIEF COMPLAINT:  abd wounds, abd pain     SUBJECTIVE:    UTA ROS, pt intubated and sedated.  She was intubated yesterday for IR placed CL and abscess drainage     OBJECTIVE:  VITALS:    Visit Vitals   ??? BP (!) 77/45   ??? Pulse 112   ??? Temp 97.1 ??F (36.2 ??C)   ??? Resp 26   ??? Ht 5\' 2"  (1.575 m)   ??? Wt 87 lb 4.8 oz (39.6 kg)   ??? SpO2 100%   ??? BMI 15.97 kg/m2       INTAKE/OUTPUT:    I/O last 3 completed shifts:  In: 2412.6 [I.V.:2051; IV Piggyback:173.7]  Out: 2150 [Urine:375; Drains:1775]       Drain/tube Output:    Open Drain Left LLQ-Output (ml): 850 ml  Open Drain Anterior Abdomen-Output (ml): 30 ml      CONSTITUTIONAL:  Intubated and seadted  ABDOMEN:  Abdominal wounds cleaner, dressings in place    Skin: no jaundice     Data:  CBC:   Recent Labs      06/26/15   0449  06/26/15   1110  06/27/15   0540   WBC  30.0*  31.4*  34.8*   HGB  7.8*  7.9*  8.2*   HCT  26.0*  26.6*  28.4*   PLT  136  122*  101*     BMP:    Recent Labs      06/26/15   0449  06/26/15   1110  06/27/15   0540   NA  138  138  140   K  3.6  2.9*  4.5   CL  102  99  110   CO2  23  20*  18*   BUN  38*  37*  35*   CREATININE  1.0  1.1  1.0   GLUCOSE  73  96  202*     Hepatic:   Recent Labs      06/25/15   2331  06/26/15   1110  06/27/15   0540   AST  24  27  21    ALT  29  34  29   BILITOT  0.6  0.5  0.4   ALKPHOS  122  126  147*     Mag:      Recent Labs      06/26/15   0449  06/27/15   0540   MG  1.40*  2.10      Phos:     Recent Labs      06/27/15   0540   PHOS  3.0      INR:   Recent Labs      06/25/15   1113   INR  1.56*       CULTURES:   Anaerobic culture  Recent Labs      06/26/15   1554   LABANAE  Further report to follow       Blood culture  No results for input(s): BC in the last 72 hours.    Blood culture 2  Recent Labs      06/25/15   0705   BLOODCULT2  No Growth to date.  Any change in status will be called.       Body fluid  culture  Recent Labs      06/25/15   0705   BLOODCULT2  No Growth to date.  Any change in status will be called.       Surgical culture  No results for input(s): CXSURG in the last 72 hours.    Fecal occult  No results for input(s): OCCULTBLDFEC in the last 72 hours.    Gram stain  Recent Labs      06/26/15   1554   LABGRAM  4+ WBC's (Polymorphonuclear)  4+ Gram positive cocci  in chains and pairs- resembling Strep  4+ Gram negative diplococci         Stool culture 1  No results for input(s): CXST in the last 72 hours.    Stool culture 2  No results for input(s): STOOLCULT2 in the last 72 hours.    Urine culture  No results for input(s): LABURIN in the last 72 hours.    Wound abscess  No results for input(s): WNDABS in the last 72 hours.      ABX: as ordered      ASSESSMENT/PLAN:      45 y.o. female admitted with   1. Cachexia (HCC)    2. Leukocytosis, unspecified type    3. Postprocedural intra-abdominal sepsis, initial encounter ([place])    4. Dehydration    5. Open wnd ant abdomen-comp, initial encounter    6. Lactic acidosis    7. Hyperkalemia    8. Hypocalcemia    9. Encephalopathies    10. AKI (acute kidney injury) ([place])    11. Abscess        Vent management per pulm,?extubate today  Appreciate Derinda Sis and wound care -   TPN  NPO,, bowel rest  DC planning - candidate for LTAC    Discussed with patient and nursing.  Discussed with Dr. Rodney Booze and ICU team        Delorse Limber, CNP    Continue current supportive measures  Anticipate extubation today  TPN started  Will repeat CT scan in 4 days  Working on eventual disposition to [facility]

## 2015-06-27 NOTE — Progress Notes (Signed)
Pt put into spontaneous mode at 0910, pressure support 10, peep 5, oxygen 35%, pt SpO2 100%. Pt tolerating well.

## 2015-06-27 NOTE — Progress Notes (Signed)
Reassessment complete and as charted in doc flow sheets, see flow sheets for details.  Continues on mechanical ventilation, tolerating well. VSS, afebrile, NSR on cardiac monitor.

## 2015-06-27 NOTE — Plan of Care (Signed)
Problem: Infection, Septic Shock:  Goal: Will show no infection signs and symptoms  Will show no infection signs and symptoms   Outcome: Ongoing    Problem: Nutrition  Goal: Optimal nutrition therapy  Outcome: Ongoing  Will tolerate parenteral nutrition at ordered rate; exhibit improvement in nutritional status.     Problem: Wound:  Goal: Will show signs of wound healing; wound closure and no evidence of infection  Will show signs of wound healing; wound closure and no evidence of infection   Outcome: Ongoing    Problem: Pressure Ulcer:  Goal: Signs of wound healing will improve  Signs of wound healing will improve   Outcome: Ongoing

## 2015-06-28 ENCOUNTER — Inpatient Hospital Stay: Primary: Internal Medicine

## 2015-06-28 LAB — CULTURE, ANAEROBIC AND AEROBIC

## 2015-06-28 LAB — POCT GLUCOSE
POC Glucose: 156 mg/dl — ABNORMAL HIGH (ref 70–99)
POC Glucose: 178 mg/dl — ABNORMAL HIGH (ref 70–99)
POC Glucose: 139 mg/dl — ABNORMAL HIGH (ref 70–99)
POC Glucose: 136 mg/dl — ABNORMAL HIGH (ref 70–99)
POC Glucose: 156 mg/dl — ABNORMAL HIGH (ref 70–99)

## 2015-06-28 LAB — CBC
Hematocrit: 31 % — ABNORMAL LOW (ref 36.0–48.0)
Hemoglobin: 9.6 g/dL — ABNORMAL LOW (ref 12.0–16.0)
MCH: 27.1 pg (ref 26.0–34.0)
MCHC: 30.9 g/dL — ABNORMAL LOW (ref 31.0–36.0)
MCV: 87.8 fL (ref 80.0–100.0)
MPV: 9.2 fL (ref 5.0–10.5)
PLATELET SLIDE REVIEW: DECREASED
Platelets: 58 10*3/uL — ABNORMAL LOW (ref 135–450)
RBC: 3.53 M/uL — ABNORMAL LOW (ref 4.00–5.20)
RDW: 20.2 % — ABNORMAL HIGH (ref 12.4–15.4)
WBC: 32.4 10*3/uL — ABNORMAL HIGH (ref 4.0–11.0)

## 2015-06-28 LAB — CBC WITH AUTO DIFFERENTIAL
Basophils %: 0 %
Basophils Absolute: 0 10*3/uL (ref 0.0–0.2)
Eosinophils %: 0 %
Eosinophils Absolute: 0 10*3/uL (ref 0.0–0.6)
Hematocrit: 29.6 % — ABNORMAL LOW (ref 36.0–48.0)
Hemoglobin: 9.2 g/dL — ABNORMAL LOW (ref 12.0–16.0)
Lymphocytes %: 14 %
Lymphocytes Absolute: 4 10*3/uL (ref 1.0–5.1)
MCH: 27.2 pg (ref 26.0–34.0)
MCHC: 31 g/dL (ref 31.0–36.0)
MCV: 87.7 fL (ref 80.0–100.0)
MPV: 8.8 fL (ref 5.0–10.5)
Monocytes %: 2 %
Monocytes Absolute: 0.6 10*3/uL (ref 0.0–1.3)
Neutrophils %: 84 %
Neutrophils Absolute: 23.8 10*3/uL — ABNORMAL HIGH (ref 1.7–7.7)
PLATELET SLIDE REVIEW: DECREASED
Platelets: 77 10*3/uL — ABNORMAL LOW (ref 135–450)
RBC: 3.37 M/uL — ABNORMAL LOW (ref 4.00–5.20)
RDW: 20.4 % — ABNORMAL HIGH (ref 12.4–15.4)
WBC: 28.3 10*3/uL — ABNORMAL HIGH (ref 4.0–11.0)

## 2015-06-28 LAB — MAGNESIUM: Magnesium: 1.8 mg/dL (ref 1.80–2.40)

## 2015-06-28 LAB — BASIC METABOLIC PANEL
Anion Gap: 15 (ref 3–16)
BUN: 35 mg/dL — ABNORMAL HIGH (ref 7–20)
CO2: 15 mmol/L — ABNORMAL LOW (ref 21–32)
Calcium: 6.8 mg/dL — ABNORMAL LOW (ref 8.3–10.6)
Chloride: 110 mmol/L (ref 99–110)
Creatinine: 1.1 mg/dL (ref 0.6–1.1)
GFR African American: >60 (ref >60)
GFR Non-African American: 54 — AB (ref >60)
Glucose: 139 mg/dL — ABNORMAL HIGH (ref 70–99)
Potassium: 3.6 mmol/L (ref 3.5–5.1)
Sodium: 140 mmol/L (ref 136–145)

## 2015-06-28 LAB — APTT
aPTT: 66.2 s — ABNORMAL HIGH (ref 21.0–31.8)
aPTT: 178.9 s (ref 21.0–31.8)

## 2015-06-28 LAB — PHOSPHORUS: Phosphorus: 3.2 mg/dL (ref 2.5–4.9)

## 2015-06-28 LAB — CALCIUM, IONIZED
Calcium, Ionized: 1 mmol/L — ABNORMAL LOW (ref 1.12–1.32)
pH, Ven: 7.43 (ref 7.35–7.45)

## 2015-06-28 MED ORDER — ACETAMINOPHEN 10 MG/ML IV SOLN
10 MG/ML | Freq: Four times a day (QID) | INTRAVENOUS | Status: DC | PRN
Start: 2015-06-28 — End: 2015-07-04
  Administered 2015-07-03: 02:00:00 625 mg/kg via INTRAVENOUS
  Administered 2015-07-03 – 2015-07-04 (×2): 623 mg/kg via INTRAVENOUS

## 2015-06-28 MED ORDER — IPRATROPIUM-ALBUTEROL 0.5-2.5 (3) MG/3ML IN SOLN
RESPIRATORY_TRACT | Status: DC | PRN
Start: 2015-06-28 — End: 2015-07-01

## 2015-06-28 MED ORDER — SODIUM CHLORIDE 0.9 % IV SOLN
0.9 % | INTRAVENOUS | Status: AC
Start: 2015-06-28 — End: 2015-06-28
  Administered 2015-06-28: 11:00:00 1000

## 2015-06-28 MED ORDER — TRACE MINERALS CR-CU-MN-SE-ZN 10-1000-500-60 MCG/ML IV SOLN
10-1000-500-60 MCG/ML | INTRAVENOUS | Status: AC
Start: 2015-06-28 — End: 2015-06-29
  Administered 2015-06-28: 23:00:00 via INTRAVENOUS

## 2015-06-28 MED ORDER — SODIUM CHLORIDE 0.9 % IV SOLN
0.9 % | INTRAVENOUS | Status: DC
Start: 2015-06-28 — End: 2015-06-29
  Administered 2015-06-28 – 2015-06-29 (×3): via INTRAVENOUS

## 2015-06-28 MED ORDER — ASPIRIN 81 MG PO CHEW
81 MG | Freq: Every day | ORAL | Status: DC
Start: 2015-06-28 — End: 2015-07-04
  Administered 2015-07-04: 13:00:00 81 mg via NASOGASTRIC

## 2015-06-28 MED ORDER — ATROPINE SULFATE 0.1 MG/ML IJ SOLN
0.1 MG/ML | INTRAMUSCULAR | Status: DC
Start: 2015-06-28 — End: 2015-06-27

## 2015-06-28 MED ORDER — IPRATROPIUM-ALBUTEROL 0.5-2.5 (3) MG/3ML IN SOLN
RESPIRATORY_TRACT | Status: DC
Start: 2015-06-28 — End: 2015-07-01
  Administered 2015-06-29 – 2015-07-01 (×10): 1 via RESPIRATORY_TRACT

## 2015-06-28 MED ORDER — HEPARIN SODIUM (PORCINE) 1000 UNIT/ML IJ SOLN
1000 UNIT/ML | INTRAMUSCULAR | Status: DC | PRN
Start: 2015-06-28 — End: 2015-06-28

## 2015-06-28 MED ORDER — SODIUM CHLORIDE 0.9 % IV SOLN
0.9 % | INTRAVENOUS | Status: AC
Start: 2015-06-28 — End: 2015-06-28
  Administered 2015-06-28: 11:00:00

## 2015-06-28 MED ORDER — HEPARIN SOD (PORCINE) IN D5W 100 UNIT/ML IV SOLN
100 UNIT/ML | INTRAVENOUS | Status: DC
Start: 2015-06-28 — End: 2015-06-28
  Administered 2015-06-28: 03:00:00 12 [IU]/kg/h via INTRAVENOUS

## 2015-06-28 MED ORDER — CLOPIDOGREL BISULFATE 75 MG PO TABS
75 MG | Freq: Every day | ORAL | Status: DC
Start: 2015-06-28 — End: 2015-07-04
  Administered 2015-07-04: 13:00:00 75 mg via NASOGASTRIC

## 2015-06-28 MED FILL — CLINIMIX E/DEXTROSE (5/20) 5 % IV SOLN: 5 % | INTRAVENOUS | Qty: 1000

## 2015-06-28 MED FILL — PROPOFOL 1000 MG/100ML IV EMUL: 1000 MG/100ML | INTRAVENOUS | Qty: 100

## 2015-06-28 MED FILL — HYDROMORPHONE HCL 1 MG/ML IJ SOLN: 1 MG/ML | INTRAMUSCULAR | Qty: 1

## 2015-06-28 MED FILL — FLUCONAZOLE IN SODIUM CHLORIDE 200-0.9 MG/100ML-% IV SOLN: INTRAVENOUS | Qty: 100

## 2015-06-28 MED FILL — FENTANYL 25 MCG/HR TD PT72: 25 MCG/HR | TRANSDERMAL | Qty: 1

## 2015-06-28 MED FILL — METRONIDAZOLE IN NACL 5-0.79 MG/ML-% IV SOLN: 5 mg/mL | INTRAVENOUS | Qty: 100

## 2015-06-28 MED FILL — ZOSYN 3-0.375 GM/50ML IV SOLN: INTRAVENOUS | Qty: 50

## 2015-06-28 MED FILL — NORMAL SALINE FLUSH 0.9 % IV SOLN: 0.9 % | INTRAVENOUS | Qty: 60

## 2015-06-28 MED FILL — SODIUM CHLORIDE 0.9 % IV SOLN: 0.9 % | INTRAVENOUS | Qty: 1000

## 2015-06-28 MED FILL — IPRATROPIUM-ALBUTEROL 0.5-2.5 (3) MG/3ML IN SOLN: RESPIRATORY_TRACT | Qty: 3

## 2015-06-28 MED FILL — ATROPINE SULFATE 0.1 MG/ML IJ SOLN: 0.1 MG/ML | INTRAMUSCULAR | Qty: 10

## 2015-06-28 MED FILL — VANCOMYCIN HCL IN DEXTROSE 500-5 MG/100ML-% IV SOLN: 500-5 MG/100ML-% | INTRAVENOUS | Qty: 100

## 2015-06-28 MED FILL — NORMAL SALINE FLUSH 0.9 % IV SOLN: 0.9 % | INTRAVENOUS | Qty: 10

## 2015-06-28 MED FILL — FAMOTIDINE 20 MG/2ML IV SOLN: 20 MG/2ML | INTRAVENOUS | Qty: 2

## 2015-06-28 NOTE — Progress Notes (Signed)
Erica Harding is a 45 y.o. female patient.  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 PN-ADULT PREMIXED (CENTRAL) SOLUTION WITH ELECTROLYTES   Intravenous Continuous TPN Elly Modena, MD       ??? aspirin chewable tablet 81 mg  81 mg Per NG tube Daily Neldon Mc, MD       ??? clopidogrel (PLAVIX) tablet 75 mg  75 mg Per NG tube Daily Neldon Mc, MD       ??? 0.9 % sodium chloride infusion   Intravenous Continuous Brayton Mars, MD       ??? acetaminophen (OFIRMEV) infusion 623 mg  15 mg/kg Intravenous Q6H PRN Maureen Ralphs, MD       ??? PN-ADULT PREMIXED (CENTRAL) SOLUTION WITH ELECTROLYTES   Intravenous Continuous TPN Delorse Limber, APRN 25 mL/hr at 06/27/15 1818     ??? fentaNYL (SUBLIMAZE) 1,000 mcg in sodium chloride 0.9 % 100 mL infusion  0.5 mcg/kg/hr Intravenous Continuous Brigitte Pulse, MD       ??? magnesium hydroxide (MILK OF MAGNESIA) 400 MG/5ML suspension 30 mL  30 mL Oral Daily PRN Domenick Bookbinder II, MD       ??? glucose (GLUTOSE) 40 % oral gel 15 g  15 g Oral PRN Siddharth K Mushrif, MD       ??? dextrose 50 % solution 12.5 g  12.5 g Intravenous PRN Siddharth K Mushrif, MD   12.5 g at 06/26/15 0010   ??? glucagon (rDNA) injection 1 mg  1 mg Intramuscular PRN Siddharth K Mushrif, MD       ??? dextrose 5 % solution  100 mL/hr Intravenous PRN Siddharth K Mushrif, MD       ??? propofol 1000 MG/100ML injection  10 mcg/kg/min Intravenous Titrated Brigitte Pulse, MD 2.4 mL/hr at 06/27/15 2304 10 mcg/kg/min at 06/27/15 2304   ??? albuterol sulfate HFA 108 (90 BASE) MCG/ACT inhaler 6 puff  6 puff Inhalation Q4H PRN Brigitte Pulse, MD       ??? ipratropium (ATROVENT HFA) 17 MCG/ACT inhaler 6 puff  6 puff Inhalation Q4H PRN Brigitte Pulse, MD       ??? [START ON 07/03/2015] fat emulsion 20 % infusion 250 mL  250 mL  Intravenous Once per day on Mon Thu Elly Modena, MD       ??? insulin lispro (HUMALOG) injection pen 0-6 Units  0-6 Units Subcutaneous Q4H Elly Modena, MD   1 Units at 06/28/15 0002   ??? sodium chloride flush 0.9 % injection 10 mL  10 mL Intravenous 2 times per day Brigitte Pulse, MD   10 mL at 06/28/15 1109   ??? potassium chloride 10 mEq/100 mL IVPB (Peripheral Line)  10 mEq Intravenous PRN Brigitte Pulse, MD       ??? potassium chloride 20 mEq/50 mL IVPB (Central Line)  20 mEq Intravenous PRN Brigitte Pulse, MD 50 mL/hr at 06/26/15 2112 20 mEq at 06/26/15 2112   ??? magnesium sulfate 1 g in dextrose 5% 100 mL IVPB premix  1 g Intravenous PRN Brigitte Pulse, MD       ??? acetaminophen (TYLENOL) tablet 650 mg  650 mg Oral Q4H PRN Brigitte Pulse, MD       ??? ondansetron (ZOFRAN) injection 4 mg  4 mg Intravenous Q6H PRN Brigitte Pulse, MD       ???  famotidine (PEPCID) injection 20 mg  20 mg Intravenous Daily Brigitte Pulse, MD   20 mg at 06/28/15 1109   ??? fluconazole (DIFLUCAN) 200 mg in 0.9 % NaCl 100 mL premix IVPB  200 mg Intravenous Q24H Brigitte Pulse, MD 100 mL/hr at 06/28/15 0841 200 mg at 06/28/15 0841   ??? metronidazole (FLAGYL) 500 mg in NaCl 100 mL IVPB premix  500 mg Intravenous Q8H Brigitte Pulse, MD   Stopped at 06/28/15 [phone]   ??? piperacillin-tazobactam (ZOSYN) 3.375 g in dextrose 50 mL IVPB extended infusion (premix)  3.375 g Intravenous Q8H Brigitte Pulse, MD 12.5 mL/hr at 06/28/15 0841 3.375 g at 06/28/15 0841   ??? sodium chloride flush 0.9 % injection 10 mL  10 mL Intravenous 2 times per day Delorse Limber, APRN   10 mL at 06/27/15 2036   ??? vancomycin (VANCOCIN) 500 mg in dextrose 5% 100 mL IVPB  500 mg Intravenous Q24H Brigitte Pulse, MD 100 mL/hr at 06/28/15 1109 500 mg at 06/28/15 1109   ??? fentaNYL (DURAGESIC) 25 MCG/HR 1 patch  1 patch Transdermal Q72H Brigitte Pulse, MD   1 patch at 06/25/15 2020   ??? HYDROmorphone (DILAUDID) injection 0.5 mg  0.5 mg Intravenous Q2H PRN Brigitte Pulse, MD   0.5 mg at 06/26/15 [id number]    Or   ??? HYDROmorphone (DILAUDID) injection 1 mg  1 mg Intravenous Q2H PRN Brigitte Pulse, MD   1 mg at 06/28/15 1230     Allergies   Allergen Reactions   ??? Latex Itching   ??? Compazine [Prochlorperazine Maleate] Swelling     Active Problems:    AKI (acute kidney injury) ([place])    Hyperkalemia    Open wnd ant abdomen-comp    Peritonitis and retroperitoneal infections (HCC)    Fistula    Ischemic foot    Cachexia (HCC)    Postprocedural intra-abdominal sepsis (HCC)    Blood pressure 88/64, pulse 124, temperature 99.1 ??F (37.3 ??C), temperature source Temporal, resp. rate 23, height 5\' 2"  (1.575 m), weight 91 lb 7.9 oz (41.5 kg), SpO2 100 %.    Subjective patient on the ventilator  Objective:  General Appearance:  Comfortable.    Vital signs: (most recent): Blood pressure 88/64, pulse 124, temperature 99.1 ??F (37.3 ??C), temperature source Temporal, resp. rate 23, height 5\' 2"  (1.575 m), weight 91 lb 7.9 oz (41.5 kg), SpO2 100 %.    Output: Producing urine.    Abdomen: Abdomen is soft and non-distended.      Assessment & Plan 45 year old female with complex past surgical history and multiple intestinal fistulas. She is S/P IR drainage of pelvic fluid collection. The drain put out almost 700 ml of enteric contents yesterday. The enterocutaneous fistula outputs have decreased since the patient was made NPO and the drain was placed. Continue antibiotics, TPN and bowel rest.    Elly Modena, MD  06/28/2015

## 2015-06-28 NOTE — Progress Notes (Signed)
Pt met rn/rt weaning parameters and was extubated per md order. Pt extubated to 3lpmnc. Vss. Rn at bedside.

## 2015-06-28 NOTE — Progress Notes (Signed)
Nutrition Assessment    Type and Reason for Visit: Reassess    Nutrition Recommendations:   1. Continue to advance Clinimix 5/20 towards initial goal rate 50 mL per hour.  2. Physician/LIP to monitor closely and correct lytes (Phos,Mg,K+) d/t risk of refeeding syndrome  3. Recommend 250 mL 20% lipids two times per week beginning 3/21 if TG is WNL and propofol has been d/c'd.   4. Recommend FSBS, monitor glucose, need for insulin  5. Pharmacy to adjust MVI and Trace Elements as needed     Malnutrition Assessment:  ?? Malnutrition Status: Meets the criteria for severe malnutrition  ?? Context: Chronic illness  ?? Findings of the 6 clinical characteristics of malnutrition (Minimum of 2 out of 6 clinical characteristics is required to make the diagnosis of moderate or severe Protein Calorie Malnutrition based on AND/ASPEN Guidelines):  1. Energy Intake-Not available,      2. Weight Loss-10% loss or greater, in 1 month  3. Fat Loss-Moderate subcutaneous fat loss, Triceps  4. Muscle Loss-Moderate muscle mass loss, Clavicles (pectoralis and deltoids), Thigh (quadriceps)  5. Fluid Accumulation-Mild fluid accumulation, Extremities  6. Grip Strength-Not measured    Nutrition Diagnosis:   ?? Problem: Inadequate oral intake  ?? Etiology: related to Insufficient energy/nutrient consumption    ??? Signs and symptoms:  as evidenced by NPO status due to medical condition    Nutrition Assessment:  ?? Subjective Assessment: Pt seen during IPOC critical care rounds, was electively intubated for procedures. Vascular surgery following for acute R ischemic foot. Pt tolerating TPN well at 25 mL per hour; plan to advance to 40 mL per hour this evening. Slowly advancing rate d/t high risk of refeeding syndrome.  Per ICU team, will try to wake pt up and extubate today.    ?? Nutrition-Focused Physical Findings: Refer to malnutrition assessment   ?? Wound Type: Pressure Ulcer, Stage II, Unstageable, Multiple, Open Wounds  ?? Current Nutrition  Therapies:  ?? Oral Diet Orders: NPO   ?? Oral Diet intake: NPO  ?? Oral Nutrition Supplement (ONS) Orders: None  ?? ONS intake: NPO  ?? Parenteral Nutrition Orders:  ?? Type and Formula: Premix Central (Clinimix 5/20)   ?? Lipids: None  ?? Rate/Volume: 25 mL per hour/ 600 mL   ?? Duration: Continuous 24 hrs  ?? Current PN Order Provides: Clinimix 5/20 at 25 mL per hour to provide 600 mL total volume, 528 calories, 30 grams protein, dextrose load 2.01 mg/kg/min  ?? Goal PN Orders Provides: Clinimix 5/20 at goal rate 50 mL per hour to provide 1200 mL total volume, 1056 calories, 60 grams protein, dextrose load 4.02 mg/kg/min  ?? Additional Calories: Propofol at 2.2 mL/hr to provide 58 calories from fat daily   ?? Anthropometric Measures:  ?? Ht: 5\' 2"  (157.5 cm)   ?? Current Body Wt: 91 lb (41.3 kg)  ?? Admission Body Wt: 81 lb (36.7 kg)  ?? Ideal Body Wt: 110 lb (49.9 kg)   ?? BMI Classification: BMI <18.5 Underweight  ?? Comparative Standards (Estimated Nutrition Needs):  ?? Estimated Daily Total Kcal: [id number]   ?? Estimated Daily Protein (g): 50-83 grams   ?? Estimated Daily Total Fluid (ml/day): 1245-1453 mL     Estimated Intake vs Estimated Needs: Intake Improving (Via TPN)    Nutrition Risk Level: High    Nutrition Interventions:   Continue NPO, Continue Parenteral Nutrition  Continued Inpatient Monitoring, Education Not Indicated    Nutrition Evaluation:   ?? Evaluation: Progressing toward  goals   ?? Goals: Pt will tolerate TPN at goal rate    ?? Monitoring: NPO Status, PN Intake, PN Tolerance, Comparative Standards, Weight, Mental Status/Confusion, Wound Healing, Skin Integrity, Pertinent Labs    See Adult Nutrition Doc Flowsheet for more detail.     Electronically signed by Greer Ee, RD, LD on 06/28/15 at 10:05 AM    Contact Number: [phone]

## 2015-06-28 NOTE — Plan of Care (Signed)
Problem: Falls - Risk of  Goal: Absence of falls  Outcome: Ongoing  Fall risk assessment protocol followed. Fall risk band and orange blanket in place. Bed alarm on. Bed in low position,  Side rails up per policy, call light within reach. Encouraged  to call for assisstance See morse fall scale.     Problem: Discharge Planning:  Goal: Discharged to appropriate level of care  Discharged to appropriate level of care   Outcome: Ongoing    Problem: Infection, Septic Shock:  Goal: Will show no infection signs and symptoms  Will show no infection signs and symptoms   Outcome: Ongoing    Problem: Nutrition  Goal: Optimal nutrition therapy  Outcome: Ongoing    Problem: Wound:  Goal: Will show signs of wound healing; wound closure and no evidence of infection  Will show signs of wound healing; wound closure and no evidence of infection   Outcome: Ongoing    Problem: Pressure Ulcer:  Goal: Signs of wound healing will improve  Signs of wound healing will improve   Outcome: Ongoing  Goal: Absence of new pressure ulcer  Absence of new pressure ulcer   Outcome: Ongoing  Goal: Will show no infection signs and symptoms  Will show no infection signs and symptoms   Outcome: Ongoing    Problem: Restraint Use - Nonviolent/Non-Self-Destructive Behavior:  Goal: Absence of restraint indications  Absence of restraint indications   Outcome: Ongoing  Goal: Absence of restraint-related injury  Absence of restraint-related injury   Outcome: Ongoing    Problem: Anxiety/Stress  Goal: Level of anxiety will decrease  Level of anxiety will decrease   Outcome: Ongoing    Problem: Neurological  Goal: Maximum potential motor/sensory/cognitive function  Outcome: Ongoing    Problem: Cardiovascular  Goal: Hemodynamic stability  Outcome: Ongoing    Problem: GU  Goal: Adequate urinary output  Outcome: Ongoing

## 2015-06-28 NOTE — Other (Signed)
Left femoral puncture site soft without hematoma or bleeding. Right distal foot and toes purple/ cold with doppler DP and PT pulses. Left toes cool and ruddy with doppler DP and PT pulses. Temp 97.2. Bair Hugger off.

## 2015-06-28 NOTE — Progress Notes (Signed)
Infectious Disease Follow up Notes  Chief Complaint :    Rt leg ischemia  Abdominal abscess     Antibiotics:   IV Flagyl   IV Zosyn   IV Vancomycin   IV Fluconazole    Admit Date: 06/25/2015  Hospital Day: 4    Subjective:   Got extubated today feels cold remains under the bear hugger, abd pain+ family at bed side has lot of abdominal pain and Rt foot pain. S/p Revascularization of Rt leg for ischemia  Objective:     Patient Vitals for the past 8 hrs:   BP Pulse Resp SpO2   06/28/15 1800 111/80 119 17 -   06/28/15 1730 100/77 123 21 100 %   06/28/15 1700 95/60 120 14 100 %   06/28/15 1630 117/79 122 18 100 %   06/28/15 1600 (!) 122/91 135 18 100 %   06/28/15 1530 81/64 133 18 -   06/28/15 1500 (!) 81/59 123 17 -   06/28/15 1430 94/64 123 15 -   06/28/15 1400 (!) 89/58 122 14 -   06/28/15 1330 89/67 114 14 -   06/28/15 1300 (!) 86/57 114 15 -   06/28/15 1230 89/63 113 16 -   06/28/15 1220 - 124 23 100 %   06/28/15 1200 89/63 124 21 -   06/28/15 1130 89/63 119 21 -       EXAM:  General Appearance: awake, looks ill, pallor+ tongue dry+, , no icterus   Skin: warm and dry, no rash or erythema  Head: normocephalic and atraumatic  Eyes: pupils equal, round, and reactive to light, conjunctivae normal  ENT: tympanic membrane, external ear and ear canal normal bilaterally, nose without deformity, nasal mucosa and turbinates normal without polyps  Neck: supple and non-tender without mass, no thyromegaly, no cervical lymphadenopathy  Pulmonary/Chest: Bi basal crepts, no wheezes, rales or rhonchi, normal air movement, no respiratory distress  Cardiovascular: normal rate, regular rhythm, normal S1 and S2, no murmurs, rubs, clicks, or gallops,no carotid bruits  Abdomen: Drain+ ostomy+++, no masses or organomegaly  Extremities: no cyanosis, clubbing or edema  Musculoskeletal: normal range of motion, no joint swelling, deformity or tenderness  Neurologic:  reflexes normal and symmetric, no cranial nerve deficit,speech normal  Lines:PICC+   Rt foot ischemia+ reperfusion signs+          Data Review:    Lab Results   Component Value Date    WBC 28.3 (H) 06/28/2015    HGB 9.2 (L) 06/28/2015    HCT 29.6 (L) 06/28/2015    MCV 87.7 06/28/2015    PLT 77 (L) 06/28/2015     Lab Results   Component Value Date    CREATININE 1.1 06/28/2015    BUN 35 (H) 06/28/2015    NA 140 06/28/2015    K 3.6 06/28/2015    CL 110 06/28/2015    CO2 15 (L) 06/28/2015       Hepatic Function Panel: Lab Results   Component Value Date    ALKPHOS 147 06/27/2015    ALT 29 06/27/2015    AST 21 06/27/2015    PROT 4.4 06/27/2015    BILITOT 0.4 06/27/2015    LABALBU 1.3 06/27/2015       MICRO:   3/15 IR abscess fluid :   Gram Stain Result (Abnormal)    ?? 4+ WBC's (Polymorphonuclear)   4+ Gram positive cocci ??in chains and pairs- resembling Strep   4+ Gram negative diplococci      ??  WOUND/ABSCESS (Abnormal)   ?? Heavy growth Mixed enteric flora   Multiple organisms isolated, no predominance. Culture   indicates probable contamination. Please review   clinical indications to determine if a repeat culture   is necessary. No further workup to be done.      ?? Anaerobic Culture   ?? Further report to follow   No anaerobes isolated so far, Further report to follow      ?? Organism (Abnormal)   ?? Enterococcus species   ?? WOUND/ABSCESS   ?? Heavy growth   No further workup   Two colony types      ??   Narrative    ?? ORDER#: [id number] ?? ?? ?? ?? ?? ?? ?? ?? ?? ?? ?? ?? ??ORDERED BY: SUAREZ, GRETCHEN  SOURCE: Abdomen pelvic drain ?? ?? ?? ?? ?? ?? ?? COLLECTED: ??06/26/15 15:54  ANTIBIOTICS AT COLL.: ?? ?? ?? ?? ?? ?? ?? ?? ?? ?? ??RECEIVED : ??06/26/15 19:57   ??     Culture, Blood 2    ?? No Growth to date. ??Any change in status will be called.   ??   Narrative    ?? ORDER#: [id number] ?? ?? ?? ?? ?? ?? ?? ?? ?? ?? ?? ?? ??ORDERED BY: CHEUNG, JENNIFER  SOURCE: Blood ?? ?? ?? ?? ?? ?? ?? ?? ?? ?? ?? ?? ?? ?? ??COLLECTED: ??06/25/15 07:05  ANTIBIOTICS AT COLL.: ?? ?? ?? ?? ?? ?? ?? ?? ?? ??  ??RECEIVED : ??06/25/15 13:06  If child <=2 yrs old please draw pediatric bottle.~Blood Culture #          IMAGING: 3/16 CT abd/pelvis   Impression: ??    ?? Vascular-    Patent abdominal aorta.    The iliac and lower extremity arteries are overall small in caliber. 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 ??The left external iliac is patent,  small in caliber.    Femoral popliteal segments are patent bilaterally.    The right trifurcation vessels are not visualized below the mid tibia,  perhaps due to dilution. ??On the left, there is 3 vessel runoff, although  plantar arch is not visualized.    Nonvascular-    Gastrointestinal. ??Low colonic resection has been performed, with apparent  rectal stump, suggesting abdominal peritoneal resection. ??There is a right  lower quadrant colostomy. ??Suture over the lower descending colon is  indeterminate for stump versus anastomosis.    Peritoneal/retroperitoneal. ??Intraperitoneal fluid is most collected in the  dependent pelvis. ??Pelvic percutaneous drain remains in position in the right  lower pelvis. ??It would be helpful to position the patient right decubitus  for additional drainage.    Abdominal wall. ??Open wounds in the midline and left lower pelvis with likely  fistulous tracts.    Urinary. ??Bilateral percutaneous nephrostomy tubes and left ureteral stent  are normal positions. ??Right ureteral occlusion device is present. ??There is  no hydronephrosis       All the pertinent images and reports for the current Hospitalization were reviewed     Scheduled Meds:  ??? aspirin  81 mg Per NG tube Daily   ??? clopidogrel  75 mg Per NG tube Daily   ??? ipratropium-albuterol  1 ampule Inhalation Q4H WA   ??? insulin lispro  0-6 Units Subcutaneous Q4H   ??? sodium chloride flush  10 mL Intravenous 2 times per day   ??? famotidine (PEPCID) injection  20 mg Intravenous Daily   ??? fluconazole  200  mg Intravenous Q24H   ??? metroNIDAZOLE  500 mg Intravenous Q8H   ??? piperacillin-tazobactam  3.375 g  Intravenous Q8H   ??? sodium chloride flush  10 mL Intravenous 2 times per day   ??? vancomycin  500 mg Intravenous Q24H   ??? fentaNYL  1 patch Transdermal Q72H       Continuous Infusions:  ??? PN-Adult Premix 5/20 - Standard Electrolytes - Central Line 40 mL/hr at 06/28/15 1851   ??? sodium chloride 100 mL/hr at 06/28/15 1847   ??? fentaNYL (SUBLIMAZE) infusion     ??? dextrose     ??? propofol 10 mcg/kg/min (06/27/15 2304)   ??? [START ON 07/03/2015] fat emulsion         PRN Meds:  acetaminophen, ipratropium-albuterol, magnesium hydroxide, glucose, dextrose, glucagon (rDNA), dextrose, potassium chloride, potassium chloride, magnesium sulfate, acetaminophen, ondansetron, HYDROmorphone **OR** HYDROmorphone      Assessment:     S/p  1. 3rd order RLE angiogram 2. Right EIA/CFA thrombectomy (Angiojet) 3. Right EIA stent for Rt foot ischemia and early gangrene  History of colon cancer s/p resection, radiation, chemotherapy  Complicated course with radiation colitis, peritonitis   Recent prolong admission at OSH  ??  Admitted now with sepsis  Feculent peritonitis  Enterocutaneous fistulae  Cachexia   Ischemic changes feet   S/p IR aspiration of abscess    Labs, Microbiology, Radiology and pertinent results from care every where were reviewed as a part of the evaluation   Plan:   1. Cont IV Zosyn  2. Cont IV Fluconazole  3. Cont Flagyl IV  4. Cont IV Vancomycin pending cx  5. IR aspirate bowel flora   6.Remains critically ill     Discussed with patient/Family    Thanks for allowing me to participate in your patient's care and please call me with any questions or concerns.    Loran Senters MD  Infectious Disease  [facility] Physician  Phone: [phone]   Fax : [phone]

## 2015-06-28 NOTE — Other (Signed)
BLE neurovascular assessment unchanged. Temp 96 axillary. Bair Hugger remains on. Left femoral puncture site soft without hematoma or bleeding.

## 2015-06-28 NOTE — Progress Notes (Signed)
RESPIRATORY THERAPY ASSESSMENT    Erica Harding   [id number]  Jan 06, 1971      Recent Surgical Procedures:  abdominal surgery  Pulmonary History:  none  Home Respiratory Therapy:  none  Home Oxygen Therapy:  none  Current Respiratory Therapy:alb/atro mdi prn  Vitals:    06/28/15 1430   BP: 94/64   Pulse: 123   Resp: 15   Temp:    SpO2:        MPVC: 0 mL  Actual VC: 0 mL  Sputum: Sputum Color: Winferd Humphrey: Thick, Sputum How Obtained: Endotracheal, Oral tracheal, Suctioned    ASSESSMENT OF RESPIRATORY SEVERITY INDEX (RSI)    Patients with orders for inhalation medications, oxygen, or any therapeutic treatment modality will be placed on Respiratory Protocol.  They will be assessed with the first treatment and at least every 72 hours thereafter.  The following severity scale will be used to determine frequency of treatment intervention.    Respiratory History: No Smoking History = 0    Recent Surgical History: Lower Abdominal = 2    Level of Consciousness: Alert, Oriented, and Cooperative = 0    Level of Activity: Non weight bearing- transfers bed to chair only = 3    Respiratory Pattern: Regular Pattern; RR 8-20 = 0    Breath Sounds: Diminshed bilaterally and/or crackles = 2    Cough: Strong, spontaneous, non-productive = 0    SPO2 (COPD values may differ): 90-91% on room air or greater than 92% on FiO2 24- 28% = 1    Peak Flow (asthma only): not applicable = 0    RSI: 6-8 = Q4hr PRN (every four hours as needed) for dyspnea  PLAN    Goals: medication delivery, mobilize retained secretions, volume expansion and improve oxygenation    Patient educated on: Disease Process, Medications, Oxygenation and Deep Breath and Cough     Comment / Outcomes: encourage cough and deep breathing    Plan of Care: per rsi prn hhn, however pt extub today and would benefot from hhn q4 wa     Is patient being placed on Home Treatment Regimen? No      Respiratory Protocol Guidelines    1. Assessment and treatment by Respiratory Therapy  will be initiated for medication and therapeutic interventions upon initiation of aerosolized medication.  2. Physician will be contacted for respiratory rate (RR) greater than 35 breaths per minute. Therapy will be held for heart rate (HR) greater than 140 beats per minute, pending direction from physician.  3. Bronchodilators will be administered via Metered Dose Inhaler (MDI) or Hydrofluoroalkane (HFA) with spacer when the following criteria are met:  a. Alert and cooperative     b. HR < 140 bpm  c. RR < 30 bpm                d. Can demonstrate a 2???3 second inspiratory hold  4. Bronchodilators will be administered via Hand Held Nebulizer [facility]) to patients when ANY of the following criteria are met  a. Incogizant or uncooperative          b. Patients treated with HHN at Home        c. Unable to demonstrate proper MDI or HFA technique     d. RR > 30 bpm   5. Bronchodilators will be delivered via Metered Dose Inhaler (MDI) or Hydrofluoroalkane (HFA) with spacer to intubated patients on mechanical ventilation.  6. Inhalation medication orders will be delivered and/or substituted as outlined below.  Aerosolized Medications Ordering and Administration Guidelines:    1. All Medications will be ordered by a physician, and their frequency and/or modality will be adjusted as defined by the patients Respiratory Severity Index (RSI) score.  2. If the patient does not have documented COPD, consider discontinuing anticholinergics when RSI is less than 9.  3. If the bronchospasm worsens (increased RSI), then the bronchodilator frequency can be increased to a maximum of every 4 hours.  If greater than every 4 hours is required, the physician will be contacted.  4. If the bronchospasm improves, the frequency of the bronchodilator can be decreased, based on the patient's RSI, but not less than home treatment regimen frequency.  5. Bronchodilator(s) will be discontinued if patient has a RSI less than 9 and has received no  scheduled or as needed treatment for 72  Hrs.    Patients Ordered on a Mucolytic Agent:    1. Must always be administered with a bronchodilator.    2. Discontinue if patient experiences worsened bronchospasm, or secretions have lessened to the point that the patient is able to clear them with a cough.    Anti-inflammatory and Combination Medications:    1. If the patient lacks prior history of lung disease, is not using inhaled anti-inflammatory medication at home, and lacks wheezing by examination or by history for at least 24 hours, contact physician for possible discontinuation.      Pharmacy formulary interchanges for Respiratory medications can also be found by using the Smart Phrase .[email or web address] in any EPIC comment field.

## 2015-06-28 NOTE — Other (Addendum)
Patient Acct Nbr:  [id number]  Primary AUTH/CERT:    [place] Name:   [place]  Primary Insurance Plan Name:  [facility] HMO/POS  Primary Insurance Group Number:    Primary Insurance Plan Type: [employer] Insurance Policy Number:  [id number]

## 2015-06-28 NOTE — Plan of Care (Signed)
Post procedure site check completed. Surgical site is within normal limits and appears to be free of complications. All concerns were reviewed and questions answered. Patient verbalized understanding.

## 2015-06-28 NOTE — Other (Signed)
Dr Lucendia Herrlich in to see pt- Heparin stopped per order.

## 2015-06-28 NOTE — Other (Signed)
PTT 178.9- Heparin placed on hold- will resume in 1 hour @ 6 units/kg/hr per protocol.

## 2015-06-28 NOTE — Plan of Care (Signed)
Problem: Nutrition  Goal: Optimal nutrition therapy  Outcome: Ongoing  Nutrition Problem: Inadequate oral intake  Intervention: Food and/or Nutrient Delivery: Continue NPO, Continue Parenteral Nutrition  Nutritional Goals: Pt will tolerate TPN at goal rate

## 2015-06-28 NOTE — Progress Notes (Signed)
Surgery Progress Note              PATIENT NAME: Erica Harding     TODAY'S DATE: 06/28/2015    SUBJECTIVE:    Pt underwent thrombectomy with covered stent placement yesterday of right EIA.     OBJECTIVE:   VITALS:    Visit Vitals   ??? BP 96/71   ??? Pulse 133   ??? Temp 97.6 ??F (36.4 ??C) (Axillary)   ??? Resp 27   ??? Ht 5\' 2"  (1.575 m)   ??? Wt 91 lb 7.9 oz (41.5 kg)   ??? SpO2 100%   ??? BMI 16.73 kg/m2     TEMPERATURE:    Current - Temp: 97.6 ??F (36.4 ??C); Max - Temp  Avg: 95.7 ??F (35.4 ??C)  Min: 94.1 ??F (34.5 ??C)  Max: 97.6 ??F (36.4 ??C)  PULSE RANGE:   Pulse  Avg: 104.6  Min: 84  Max: 133  BLOOD PRESSURE RANGE:    Systolic (123XX123), 123456 , Min:72 , Q000111Q   ; Diastolic (123XX123), 0000000, Min:42, Max:77    PULSE OXIMETRY RANGE:   SpO2  Avg: 99.6 %  Min: 95 %  Max: 100 %  RESPIRATIONS RANGE:   Resp  Avg: 20.5  Min: 14  Max: 28    INTAKE/OUTPUT:  In: 2242 [I.V.:1557; Blood:350]  Out: [id number] [Urine:305; Drains:330]  ??? sodium chloride     ??? PN-Adult Premix 5/20 - Standard Electrolytes - Central Line     ??? PN-Adult Premix 5/20 - Standard Electrolytes - Central Line 25 mL/hr at 06/27/15 1818   ??? fentaNYL (SUBLIMAZE) infusion     ??? dextrose     ??? propofol 10 mcg/kg/min (06/27/15 2304)   ??? [START ON 07/03/2015] fat emulsion                                      Data Review:  CBC:   Recent Labs      06/27/15   0540  06/27/15   2115  06/28/15   0450   WBC  34.8*  32.4*  28.3*   HGB  8.2*  9.6*  9.2*   HCT  28.4*  31.0*  29.6*   MCV  87.8  87.8  87.7   PLT  101*  58*  77*     BMP:   Recent Labs      06/26/15   1110  06/27/15   0540  06/28/15   0450   NA  138  140  140   K  2.9*  4.5  3.6   CL  99  110  110   CO2  20*  18*  15*   PHOS   --   3.0  3.2   BUN  37*  35*  35*   CREATININE  1.1  1.0  1.1     Cardiac Enzymes: No results for input(s): CKTOTAL, CKMB, CKMBINDEX, TROPONINI in the last 72 hours.  PT/INR:   Recent Labs      06/25/15   1113   PROTIME  17.6*   INR  1.56*     APTT:   Recent Labs      06/25/15   1113  06/27/15   2115  06/28/15    0450   APTT  49.9*  66.2*  178.9*     Physical Exam:  General appearance: alert, resting in bed  Extremities:  palpable DP bilaterally, area of ischemia to right toes and distal foot      ASSESSMENT AND PLAN:  45 y.o. female status post right EIA and CIA thrombectomy with covered stent placement in right EIA POD #1    Will stop heparin gtt today and start ASA and Plavix  Area or distal right foot/toes will soon demarcate and likely need TMA     Neldon Mc, MD  06/28/2015  10:14 AM

## 2015-06-28 NOTE — Progress Notes (Signed)
[facility]  ICU F/U Note    Subjective:   CC / HPI: sepsis, intraabdominal abscess  Patient is sedated on vent support, events noted and she S/P thrombectomy with stent placement R EIA per vascular surgery    PMH: Reviewed, no changes    PSH: Reviewed, no changes      Review of Systems  unobtainable    Objective:   Data Review:  Vital Signs:   Visit Vitals   ??? BP 96/71   ??? Pulse 133   ??? Temp 97.6 ??F (36.4 ??C) (Axillary)   ??? Resp 27   ??? Ht 5\' 2"  (1.575 m)   ??? Wt 91 lb 7.9 oz (41.5 kg)   ??? SpO2 100%   ??? BMI 16.73 kg/m2       24 Hour I&O Review:     Intake/Output Summary (Last 24 hours) at 06/28/15 1021  Last data filed at 06/28/15 0600   Gross per 24 hour   Intake             2698 ml   Output             1185 ml   Net             1513 ml     Continuous Infusions:  ??? sodium chloride     ??? PN-Adult Premix 5/20 - Standard Electrolytes - Central Line     ??? PN-Adult Premix 5/20 - Standard Electrolytes - Central Line 25 mL/hr at 06/27/15 1818   ??? fentaNYL (SUBLIMAZE) infusion     ??? dextrose     ??? propofol 10 mcg/kg/min (06/27/15 2304)   ??? [START ON 07/03/2015] fat emulsion       Current Medications: Current Facility-Administered Medications: sodium chloride 0.9 % infusion, , ,   PN-ADULT PREMIXED (CENTRAL) SOLUTION WITH ELECTROLYTES, , Intravenous, Continuous TPN  PN-ADULT PREMIXED (CENTRAL) SOLUTION WITH ELECTROLYTES, , Intravenous, Continuous TPN  fentaNYL (SUBLIMAZE) 1,000 mcg in sodium chloride 0.9 % 100 mL infusion, 0.5 mcg/kg/hr, Intravenous, Continuous  magnesium hydroxide (MILK OF MAGNESIA) 400 MG/5ML suspension 30 mL, 30 mL, Oral, Daily PRN  heparin (porcine) injection 2,400 Units, 60 Units/kg, Intravenous, PRN  heparin (porcine) injection 1,200 Units, 30 Units/kg, Intravenous, PRN  glucose (GLUTOSE) 40 % oral gel 15 g, 15 g, Oral, PRN  dextrose 50 % solution 12.5 g, 12.5 g, Intravenous, PRN  glucagon (rDNA) injection 1 mg, 1 mg, Intramuscular, PRN  dextrose 5 % solution, 100 mL/hr, Intravenous,  PRN  propofol 1000 MG/100ML injection, 10 mcg/kg/min, Intravenous, Titrated  albuterol sulfate HFA 108 (90 BASE) MCG/ACT inhaler 6 puff, 6 puff, Inhalation, Q4H PRN  ipratropium (ATROVENT HFA) 17 MCG/ACT inhaler 6 puff, 6 puff, Inhalation, Q4H PRN  [START ON 07/03/2015] fat emulsion 20 % infusion 250 mL, 250 mL, Intravenous, Once per day on Mon Thu  insulin lispro (HUMALOG) injection pen 0-6 Units, 0-6 Units, Subcutaneous, Q4H  thiamine (B-1) 300 mg in sodium chloride 0.9 % 50 mL IVPB, 300 mg, Intravenous, Daily  sodium chloride flush 0.9 % injection 10 mL, 10 mL, Intravenous, 2 times per day  potassium chloride 10 mEq/100 mL IVPB (Peripheral Line), 10 mEq, Intravenous, PRN  potassium chloride 20 mEq/50 mL IVPB (Central Line), 20 mEq, Intravenous, PRN  magnesium sulfate 1 g in dextrose 5% 100 mL IVPB premix, 1 g, Intravenous, PRN  acetaminophen (TYLENOL) tablet 650 mg, 650 mg, Oral, Q4H PRN  ondansetron (ZOFRAN) injection 4 mg, 4 mg, Intravenous, Q6H PRN  famotidine (PEPCID) injection 20 mg, 20 mg, Intravenous, Daily  fluconazole (DIFLUCAN) 200 mg in 0.9 % NaCl 100 mL premix IVPB, 200 mg, Intravenous, Q24H  metronidazole (FLAGYL) 500 mg in NaCl 100 mL IVPB premix, 500 mg, Intravenous, Q8H  piperacillin-tazobactam (ZOSYN) 3.375 g in dextrose 50 mL IVPB extended infusion (premix), 3.375 g, Intravenous, Q8H  sodium chloride flush 0.9 % injection 10 mL, 10 mL, Intravenous, 2 times per day  vancomycin (VANCOCIN) 500 mg in dextrose 5% 100 mL IVPB, 500 mg, Intravenous, Q24H  fentaNYL (DURAGESIC) 25 MCG/HR 1 patch, 1 patch, Transdermal, Q72H  HYDROmorphone (DILAUDID) injection 0.5 mg, 0.5 mg, Intravenous, Q2H PRN **OR** HYDROmorphone (DILAUDID) injection 1 mg, 1 mg, Intravenous, Q2H PRN    CBC:  Recent Labs      06/26/15   0449  06/26/15   1110  06/27/15   0540  06/27/15   2115  06/28/15   0450   WBC  30.0*  31.4*  34.8*  32.4*  28.3*   RBC  2.96*  3.03*  3.24*  3.53*  3.37*   HGB  7.8*  7.9*  8.2*  9.6*  9.2*   HCT  26.0*   26.6*  28.4*  31.0*  29.6*   PLT  136  122*  101*  58*  77*   MCV  87.8  87.6  87.8  87.8  87.7   MCH  26.3  26.0  25.3*  27.1  27.2   MCHC  30.0*  29.6*  28.9*  30.9*  31.0   RDW  24.1*  24.0*  24.9*  20.2*  20.4*   NRBC   --    --   1*   --    --    BANDSPCT  1  6  16*   --    --       BMP:  Recent Labs      06/26/15   1110  06/27/15   0540  06/28/15   0450   NA  138  140  140   K  2.9*  4.5  3.6   CL  99  110  110   CO2  20*  18*  15*   BUN  37*  35*  35*   CREATININE  1.1  1.0  1.1   CALCIUM  8.4  7.6*  6.8*   GLUCOSE  96  202*  139*        Radiology Review:  Pertinent images / reports were reviewed as a part of this visit.     Physical Exam   Constitutional: Awake  HENT: No oropharyngeal exudate.   Eyes: Pupils are equal, round, and reactive to light.   Neck: No JVD present. No tracheal deviation present.   Cardiovascular: regular rhythm, S1&S2 and intact distal pulses.    Pulmonary/Chest: bilateral breath sounds. No wheezes, rhonchi, rales or tenderness.   Abdominal: previous abdominal wounds noted with exposure of intra abdominal fluids   Musculoskeletal: No edema and no tenderness. ischemic changes of R>L digits with no pulse appreciated on the R foot  Neurological: Non focal.    Assessment:     1. Intra abdominal infection with abscess  2. Sepsis 2/2 above  3. AKI  4. Hx of colorectal ca  5. Ischemic foot, acute   Plan:     VS reviewed as above. Stress ulcer, DVT, and line protocols are being followed if not contraindicated.  Wean sedation and try liberating from vent support  Pain  control  Continue IVF  Continue abx  On TPN now  Discussed with ICU team      Total critical care time caring for this patient with life threatening illness, including direct patient contact, management of life support systems, review of data including imaging and labs, discussions with other team members and physicians is at least 32 minutes so far today, excluding procedures.  ??  ??

## 2015-06-28 NOTE — Progress Notes (Addendum)
Hospitalist Progress Note      PCP: Referring Not In System (Inactive)    Date of Admission: 06/25/2015    Chief Complaint: open abd wounds and R foot gangrene    Hospital Course: Admitted due to open abdominal wounds and peritonitis. S/P  Intubation for procedures, remains on vent.       Subjective: stable overnight, no new issues.       Medications:  Reviewed    Infusion Medications   ??? PN-Adult Premix 5/20 - Standard Electrolytes - Central Line 40 mL/hr at 06/28/15 1851   ??? sodium chloride 100 mL/hr at 06/28/15 1847   ??? fentaNYL (SUBLIMAZE) infusion     ??? dextrose     ??? propofol 10 mcg/kg/min (06/27/15 2304)   ??? [START ON 07/03/2015] fat emulsion       Scheduled Medications   ??? aspirin  81 mg Per NG tube Daily   ??? clopidogrel  75 mg Per NG tube Daily   ??? ipratropium-albuterol  1 ampule Inhalation Q4H WA   ??? insulin lispro  0-6 Units Subcutaneous Q4H   ??? sodium chloride flush  10 mL Intravenous 2 times per day   ??? famotidine (PEPCID) injection  20 mg Intravenous Daily   ??? fluconazole  200 mg Intravenous Q24H   ??? metroNIDAZOLE  500 mg Intravenous Q8H   ??? piperacillin-tazobactam  3.375 g Intravenous Q8H   ??? sodium chloride flush  10 mL Intravenous 2 times per day   ??? vancomycin  500 mg Intravenous Q24H   ??? fentaNYL  1 patch Transdermal Q72H     PRN Meds: acetaminophen, ipratropium-albuterol, magnesium hydroxide, glucose, dextrose, glucagon (rDNA), dextrose, potassium chloride, potassium chloride, magnesium sulfate, acetaminophen, ondansetron, HYDROmorphone **OR** HYDROmorphone      Intake/Output Summary (Last 24 hours) at 06/28/15 1936  Last data filed at 06/28/15 1400   Gross per 24 hour   Intake             3757 ml   Output             1085 ml   Net             2672 ml       Exam:    Visit Vitals   ??? BP 111/80   ??? Pulse 119   ??? Temp 99.1 ??F (37.3 ??C) (Temporal)   ??? Resp 17   ??? Ht 5\' 2"  (1.575 m)   ??? Wt 91 lb 7.9 oz (41.5 kg)   ??? SpO2 100%   ??? BMI 16.73 kg/m2       General appearance: No apparent distress,  appears stated age and cooperative.  HEENT: Pupils equal, round, and reactive to light. Conjunctivae/corneas clear.  Neck: Supple, with full range of motion. No jugular venous distention. Trachea midline.  Respiratory:  Normal respiratory effort. Clear to auscultation, bilaterally without Rales/Wheezes/Rhonchi.  Cardiovascular: Regular rate and rhythm with normal S1/S2 without murmurs, rubs or gallops.  Abdomen: multiple open wounds and drains  Musculoskeletal: R foot toes 2-4 black discoloration as well as anterior forefoot. No pulses R foot. Full range of motion.  Skin: Skin color, texture, turgor normal.  No rashes or lesions.  Neurologic:  Neurovascularly intact without any focal sensory/motor deficits. Cranial nerves: II-XII intact, grossly non-focal.  Psychiatric: Alert and oriented, thought content appropriate, normal insight  Capillary Refill: 3 seconds   Peripheral Pulses: +2 palpable, equal bilaterally       Labs:   Recent Labs  06/27/15   0540  06/27/15   2115  06/28/15   0450   WBC  34.8*  32.4*  28.3*   HGB  8.2*  9.6*  9.2*   HCT  28.4*  31.0*  29.6*   PLT  101*  58*  77*     Recent Labs      06/26/15   1110  06/27/15   0540  06/28/15   0450   NA  138  140  140   K  2.9*  4.5  3.6   CL  99  110  110   CO2  20*  18*  15*   BUN  37*  35*  35*   CREATININE  1.1  1.0  1.1   CALCIUM  8.4  7.6*  6.8*   PHOS   --   3.0  3.2     Recent Labs      06/25/15   2331  06/26/15   1110  06/27/15   0540   AST  24  27  21    ALT  29  34  29   BILITOT  0.6  0.5  0.4   ALKPHOS  122  126  147*     No results for input(s): INR in the last 72 hours.  No results for input(s): CKTOTAL, TROPONINI in the last 72 hours.    Urinalysis:    No results found for: NITRU, WBCUA, BACTERIA, RBCUA, BLOODU, [place], [place]    Radiology:  CTA ABDOMINAL AORTA W BILAT RUNOFF [place]   Final Result   Vascular-      Patent abdominal aorta.      The iliac and lower extremity arteries are overall small in caliber.  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  The left external iliac is patent,   small in caliber.      Femoral popliteal segments are patent bilaterally.      The right trifurcation vessels are not visualized below the mid tibia,   perhaps due to dilution.  On the left, there is 3 vessel runoff, although   plantar arch is not visualized.      Nonvascular-      Gastrointestinal.  Low colonic resection has been performed, with apparent   rectal stump, suggesting abdominal peritoneal resection.  There is a right   lower quadrant colostomy.  Suture over the lower descending colon is   indeterminate for stump versus anastomosis.      Peritoneal/retroperitoneal.  Intraperitoneal fluid is most collected in the   dependent pelvis.  Pelvic percutaneous drain remains in position in the right   lower pelvis.  It would be helpful to position the patient right decubitus   for additional drainage.      Abdominal wall.  Open wounds in the midline and left lower pelvis with likely   fistulous tracts.      Urinary.  Bilateral percutaneous nephrostomy tubes and left ureteral stent   are normal positions.  Right ureteral occlusion device is present.  There is   no hydronephrosis.         IR ABSCESS DRAIN RETROPERITONEAL PERCUTANEOUS   Final Result   Successful placement of 14 French percutaneous drain to the left lower   quadrant/pelvis.         IR Fluoroscopy Guided Central Venous Access Device Placement   Final Result   Successful placement of right jugular triple-lumen venous catheter, by   sonographic and fluoroscopic guidance.  XR Chest Portable   Final Result   Satisfactory position endotracheal tube.      No acute cardiac or pulmonary disease.         CT ABDOMEN PELVIS WO IV CONTRAST Additional Contrast? None   Final Result   1. Moderate amount of complex intraperitoneal and extraperitoneal fluid with   multiple locules of air concerning for an infectious process including   possible anastomotic leak in the absence of recent surgery,  limited by the   lack of intravenous and enteric contrast.         CT Head WO Contrast   Final Result   No acute intracranial abnormality.         XR Chest Portable   Final Result   The lungs are hyperinflated, but there is no acute finding in the chest.         XR Chest Portable    (Results Pending)           Assessment/Plan:    Active Hospital Problems    Diagnosis Date Noted   ??? Postprocedural intra-abdominal sepsis ([place]) [T81.4XXA, A41.9]    ??? 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 Cachexia ([place]) [R64]    ??? AKI (acute kidney injury) ([place]) [N17.9]    ??? Hyperkalemia [E87.5]    ??? Open wnd ant abdomen-comp [S31.109A]    ??? Peritonitis and retroperitoneal infections ([place]) [K65.9]    ??? 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  ??????  Suspected RLE ischemia/ gangrene -- R 2-4 toes w black discoloration and all toes discoloration on RLE, s/p thrombectomy per Vasc Surg on 3/16  CTA obtained   [facility] after   ??  AKI -- POA -- resolved   ??  Acute respiratory failure -- pt electively intubated for procedures strictly   ETT 1  Continue mechanical ventilation until all procedures done   Pulmonologist following   ??  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

## 2015-06-29 ENCOUNTER — Inpatient Hospital Stay: Primary: Internal Medicine

## 2015-06-29 LAB — CBC WITH AUTO DIFFERENTIAL
Basophils %: 0.2 %
Basophils Absolute: 0.1 10*3/uL (ref 0.0–0.2)
Eosinophils %: 0 %
Eosinophils Absolute: 0 10*3/uL (ref 0.0–0.6)
Hematocrit: 26.2 % — ABNORMAL LOW (ref 36.0–48.0)
Hemoglobin: 8.1 g/dL — ABNORMAL LOW (ref 12.0–16.0)
Lymphocytes %: 10.1 %
Lymphocytes Absolute: 2.2 10*3/uL (ref 1.0–5.1)
MCH: 27.5 pg (ref 26.0–34.0)
MCHC: 30.7 g/dL — ABNORMAL LOW (ref 31.0–36.0)
MCV: 89.5 fL (ref 80.0–100.0)
MPV: 8.2 fL (ref 5.0–10.5)
Monocytes %: 2.2 %
Monocytes Absolute: 0.5 10*3/uL (ref 0.0–1.3)
Neutrophils %: 87.5 %
Neutrophils Absolute: 18.7 10*3/uL — ABNORMAL HIGH (ref 1.7–7.7)
Platelets: 88 10*3/uL — ABNORMAL LOW (ref 135–450)
RBC: 2.93 M/uL — ABNORMAL LOW (ref 4.00–5.20)
RDW: 21.7 % — ABNORMAL HIGH (ref 12.4–15.4)
WBC: 21.4 10*3/uL — ABNORMAL HIGH (ref 4.0–11.0)

## 2015-06-29 LAB — COMPREHENSIVE METABOLIC PANEL
ALT: 36 U/L (ref 10–40)
ALT: 40 U/L (ref 10–40)
ALT: 44 U/L — ABNORMAL HIGH (ref 10–40)
AST: 41 U/L — ABNORMAL HIGH (ref 15–37)
AST: 47 U/L — ABNORMAL HIGH (ref 15–37)
AST: 49 U/L — ABNORMAL HIGH (ref 15–37)
Albumin/Globulin Ratio: 0.3 — ABNORMAL LOW (ref 1.1–2.2)
Albumin/Globulin Ratio: 0.4 — ABNORMAL LOW (ref 1.1–2.2)
Albumin/Globulin Ratio: 0.4 — ABNORMAL LOW (ref 1.1–2.2)
Albumin: 0.8 g/dL — ABNORMAL LOW (ref 3.4–5.0)
Albumin: 1 g/dL — ABNORMAL LOW (ref 3.4–5.0)
Albumin: 1 g/dL — ABNORMAL LOW (ref 3.4–5.0)
Alkaline Phosphatase: 94 U/L (ref 40–129)
Alkaline Phosphatase: 88 U/L (ref 40–129)
Alkaline Phosphatase: 89 U/L (ref 40–129)
Anion Gap: 12 (ref 3–16)
Anion Gap: 12 (ref 3–16)
Anion Gap: 13 (ref 3–16)
BUN: 33 mg/dL — ABNORMAL HIGH (ref 7–20)
BUN: 32 mg/dL — ABNORMAL HIGH (ref 7–20)
BUN: 30 mg/dL — ABNORMAL HIGH (ref 7–20)
CO2: 15 mmol/L — ABNORMAL LOW (ref 21–32)
CO2: 14 mmol/L — CL (ref 21–32)
CO2: 15 mmol/L — ABNORMAL LOW (ref 21–32)
Calcium: 7 mg/dL — ABNORMAL LOW (ref 8.3–10.6)
Calcium: 6.9 mg/dL — ABNORMAL LOW (ref 8.3–10.6)
Calcium: 7.1 mg/dL — ABNORMAL LOW (ref 8.3–10.6)
Chloride: 118 mmol/L — ABNORMAL HIGH (ref 99–110)
Chloride: 118 mmol/L — ABNORMAL HIGH (ref 99–110)
Chloride: 118 mmol/L — ABNORMAL HIGH (ref 99–110)
Creatinine: 1.2 mg/dL — ABNORMAL HIGH (ref 0.6–1.1)
Creatinine: 1.1 mg/dL (ref 0.6–1.1)
Creatinine: 0.9 mg/dL (ref 0.6–1.1)
GFR African American: 59 — AB (ref >60)
GFR African American: >60 (ref >60)
GFR African American: >60 (ref >60)
GFR Non-African American: 49 — AB (ref >60)
GFR Non-African American: 54 — AB (ref >60)
GFR Non-African American: >60 (ref >60)
Globulin: 2.5 g/dL
Globulin: 2.4 g/dL
Globulin: 2.4 g/dL
Glucose: 165 mg/dL — ABNORMAL HIGH (ref 70–99)
Glucose: 161 mg/dL — ABNORMAL HIGH (ref 70–99)
Glucose: 175 mg/dL — ABNORMAL HIGH (ref 70–99)
Potassium: 3.2 mmol/L — ABNORMAL LOW (ref 3.5–5.1)
Potassium: 4.1 mmol/L (ref 3.5–5.1)
Potassium: 3.5 mmol/L (ref 3.5–5.1)
Sodium: 145 mmol/L (ref 136–145)
Sodium: 144 mmol/L (ref 136–145)
Sodium: 146 mmol/L — ABNORMAL HIGH (ref 136–145)
Total Bilirubin: 0.3 mg/dL (ref 0.0–1.0)
Total Bilirubin: 0.4 mg/dL (ref 0.0–1.0)
Total Bilirubin: 0.4 mg/dL (ref 0.0–1.0)
Total Protein: 3.3 g/dL — ABNORMAL LOW (ref 6.4–8.2)
Total Protein: 3.4 g/dL — ABNORMAL LOW (ref 6.4–8.2)
Total Protein: 3.4 g/dL — ABNORMAL LOW (ref 6.4–8.2)

## 2015-06-29 LAB — POCT GLUCOSE
POC Glucose: 132 mg/dl — ABNORMAL HIGH (ref 70–99)
POC Glucose: 166 mg/dl — ABNORMAL HIGH (ref 70–99)
POC Glucose: 167 mg/dl — ABNORMAL HIGH (ref 70–99)
POC Glucose: 167 mg/dl — ABNORMAL HIGH (ref 70–99)
POC Glucose: 186 mg/dl — ABNORMAL HIGH (ref 70–99)
POC Glucose: 173 mg/dl — ABNORMAL HIGH (ref 70–99)

## 2015-06-29 LAB — PHOSPHORUS: Phosphorus: 2.7 mg/dL (ref 2.5–4.9)

## 2015-06-29 LAB — CALCIUM, IONIZED
Calcium, Ionized: 1.1 mmol/L — ABNORMAL LOW (ref 1.12–1.32)
pH, Ven: 7.33 — ABNORMAL LOW (ref 7.35–7.45)

## 2015-06-29 LAB — POC ACT-LR
POC ACT LR: 158 s
POC ACT LR: 314 s

## 2015-06-29 LAB — BASIC METABOLIC PANEL
Anion Gap: 14 (ref 3–16)
BUN: 33 mg/dL — ABNORMAL HIGH (ref 7–20)
CO2: 14 mmol/L — CL (ref 21–32)
Calcium: 6.9 mg/dL — ABNORMAL LOW (ref 8.3–10.6)
Chloride: 117 mmol/L — ABNORMAL HIGH (ref 99–110)
Creatinine: 1.1 mg/dL (ref 0.6–1.1)
GFR African American: >60 (ref >60)
GFR Non-African American: 54 — AB (ref >60)
Glucose: 172 mg/dL — ABNORMAL HIGH (ref 70–99)
Potassium: 4.2 mmol/L (ref 3.5–5.1)
Sodium: 145 mmol/L (ref 136–145)

## 2015-06-29 LAB — MAGNESIUM: Magnesium: 1.5 mg/dL — ABNORMAL LOW (ref 1.80–2.40)

## 2015-06-29 LAB — VANCOMYCIN LEVEL, TROUGH: Vancomycin Tr: 15.6 ug/mL (ref 10.0–20.0)

## 2015-06-29 MED ORDER — SODIUM CHLORIDE 0.9 % IV SOLN
0.9 % | INTRAVENOUS | Status: AC
Start: 2015-06-29 — End: 2015-06-29
  Administered 2015-06-29: 11:00:00 250 via INTRAVENOUS

## 2015-06-29 MED ORDER — INFLUENZA VAC SPLIT QUAD 0.5 ML IM SUSY
0.5 ML | Freq: Once | INTRAMUSCULAR | Status: DC
Start: 2015-06-29 — End: 2015-06-30

## 2015-06-29 MED ORDER — CLINIMIX E/DEXTROSE (5/20) 5 % IV SOLN
5 % | INTRAVENOUS | Status: AC
Start: 2015-06-29 — End: 2015-06-30
  Administered 2015-06-29: 22:00:00 via INTRAVENOUS

## 2015-06-29 MED ORDER — MAGNESIUM SULFATE 2000 MG/50 ML IVPB PREMIX
2 GM/50ML | Freq: Once | INTRAVENOUS | Status: AC
Start: 2015-06-29 — End: 2015-06-29
  Administered 2015-06-29: 14:00:00 2 g via INTRAVENOUS

## 2015-06-29 MED FILL — FLUCONAZOLE IN SODIUM CHLORIDE 200-0.9 MG/100ML-% IV SOLN: INTRAVENOUS | Qty: 100

## 2015-06-29 MED FILL — NORMAL SALINE FLUSH 0.9 % IV SOLN: 0.9 % | INTRAVENOUS | Qty: 50

## 2015-06-29 MED FILL — METRONIDAZOLE IN NACL 5-0.79 MG/ML-% IV SOLN: 5 mg/mL | INTRAVENOUS | Qty: 100

## 2015-06-29 MED FILL — HYDROMORPHONE HCL 1 MG/ML IJ SOLN: 1 MG/ML | INTRAMUSCULAR | Qty: 1

## 2015-06-29 MED FILL — NORMAL SALINE FLUSH 0.9 % IV SOLN: 0.9 % | INTRAVENOUS | Qty: 30

## 2015-06-29 MED FILL — SODIUM CHLORIDE 0.9 % IV SOLN: 0.9 % | INTRAVENOUS | Qty: 250

## 2015-06-29 MED FILL — ZOSYN 3-0.375 GM/50ML IV SOLN: INTRAVENOUS | Qty: 50

## 2015-06-29 MED FILL — POTASSIUM CHLORIDE 20 MEQ/50ML IV SOLN: 20 MEQ/50ML | INTRAVENOUS | Qty: 100

## 2015-06-29 MED FILL — FAMOTIDINE 20 MG/2ML IV SOLN: 20 MG/2ML | INTRAVENOUS | Qty: 2

## 2015-06-29 MED FILL — NORMAL SALINE FLUSH 0.9 % IV SOLN: 0.9 % | INTRAVENOUS | Qty: 10

## 2015-06-29 MED FILL — IPRATROPIUM-ALBUTEROL 0.5-2.5 (3) MG/3ML IN SOLN: RESPIRATORY_TRACT | Qty: 3

## 2015-06-29 MED FILL — MAGNESIUM SULFATE 2 GM/50ML IV SOLN: 2 GM/50ML | INTRAVENOUS | Qty: 50

## 2015-06-29 MED FILL — NORMAL SALINE FLUSH 0.9 % IV SOLN: 0.9 % | INTRAVENOUS | Qty: 40

## 2015-06-29 MED FILL — NORMAL SALINE FLUSH 0.9 % IV SOLN: 0.9 % | INTRAVENOUS | Qty: 60

## 2015-06-29 MED FILL — ASPIRIN 81 MG PO CHEW: 81 MG | ORAL | Qty: 1

## 2015-06-29 MED FILL — CLINIMIX E/DEXTROSE (5/20) 5 % IV SOLN: 5 % | INTRAVENOUS | Qty: 2000

## 2015-06-29 MED FILL — VANCOMYCIN HCL IN DEXTROSE 500-5 MG/100ML-% IV SOLN: 500-5 MG/100ML-% | INTRAVENOUS | Qty: 100

## 2015-06-29 MED FILL — PLAVIX 75 MG PO TABS: 75 MG | ORAL | Qty: 1

## 2015-06-29 NOTE — Progress Notes (Signed)
Hospitalist Progress Note      PCP: Referring Not In System (Inactive)    Date of Admission: 06/25/2015    Chief Complaint: open abd wounds and R foot gangrene    Hospital Course: Admitted due to open abdominal wounds and peritonitis. S/P  Intubation for procedures, successfully extubated 3/17.       Subjective: extubated yesterday. Has increased pain w/ abd wound dressing changes.        Medications:  Reviewed    Infusion Medications   ??? PN-Adult Premix 5/20 - Standard Electrolytes - Central Line 40 mL/hr at 06/28/15 1851   ??? dextrose     ??? [START ON 07/03/2015] fat emulsion       Scheduled Medications   ??? [START ON 06/30/2015] influenza virus vaccine  0.5 mL Intramuscular Once   ??? magnesium sulfate  2 g Intravenous Once   ??? aspirin  81 mg Per NG tube Daily   ??? clopidogrel  75 mg Per NG tube Daily   ??? ipratropium-albuterol  1 ampule Inhalation Q4H WA   ??? insulin lispro  0-6 Units Subcutaneous Q4H   ??? sodium chloride flush  10 mL Intravenous 2 times per day   ??? famotidine (PEPCID) injection  20 mg Intravenous Daily   ??? fluconazole  200 mg Intravenous Q24H   ??? metroNIDAZOLE  500 mg Intravenous Q8H   ??? piperacillin-tazobactam  3.375 g Intravenous Q8H   ??? sodium chloride flush  10 mL Intravenous 2 times per day   ??? vancomycin  500 mg Intravenous Q24H   ??? fentaNYL  1 patch Transdermal Q72H     PRN Meds: acetaminophen, ipratropium-albuterol, magnesium hydroxide, glucose, dextrose, glucagon (rDNA), dextrose, potassium chloride, potassium chloride, magnesium sulfate, acetaminophen, ondansetron, HYDROmorphone **OR** HYDROmorphone      Intake/Output Summary (Last 24 hours) at 06/29/15 0956  Last data filed at 06/29/15 0400   Gross per 24 hour   Intake             3613 ml   Output             1020 ml   Net             2593 ml       Exam:    Visit Vitals   ??? BP 110/79   ??? Pulse 83   ??? Temp 97.8 ??F (36.6 ??C) (Temporal)   ??? Resp 11   ??? Ht 5\' 2"  (1.575 m)   ??? Wt 95 lb 7.4 oz (43.3 kg)   ??? SpO2 100%   ??? BMI 17.46 kg/m2        General appearance: No apparent distress, appears stated age and cooperative.  HEENT: Pupils equal, round, and reactive to light. Conjunctivae/corneas clear.  Neck: Supple, with full range of motion. No jugular venous distention. Trachea midline.  Respiratory:  Normal respiratory effort. Clear to auscultation, bilaterally without Rales/Wheezes/Rhonchi.  Cardiovascular: Regular rate and rhythm with normal S1/S2 without murmurs, rubs or gallops.  Abdomen: multiple open wounds and drains - 2 urostomies, R colostomy, 2 drains  Musculoskeletal: R foot - distal great toe and toes 2-5 black discoloration as well as anterior forefoot. DP now palpable on R foot. Full range of motion.  Skin: Skin color, texture, turgor normal.  No rashes or lesions.  Neurologic:  Neurovascularly intact without any focal sensory/motor deficits. Cranial nerves: II-XII intact, grossly non-focal.  Psychiatric: Alert and oriented, thought content appropriate, normal insight  Capillary Refill: 3 seconds   Peripheral Pulses: +  2 palpable, equal bilaterally       Labs:   Recent Labs      06/27/15   2115  06/28/15   0450  06/29/15   0615   WBC  32.4*  28.3*  21.4*   HGB  9.6*  9.2*  8.1*   HCT  31.0*  29.6*  26.2*   PLT  58*  77*  88*     Recent Labs      06/27/15   0540  06/28/15   0450  06/29/15   0005  06/29/15   0615   NA  140  140  145  144   145   K  4.5  3.6  3.2*  4.1   4.2   CL  110  110  118*  118*   117*   CO2  18*  15*  15*  14*   14*   BUN  35*  35*  33*  32*   33*   CREATININE  1.0  1.1  1.2*  1.1   1.1   CALCIUM  7.6*  6.8*  7.0*  6.9*   6.9*   PHOS  3.0  3.2   --   2.7     Recent Labs      06/27/15   0540  06/29/15   0005  06/29/15   0615   AST  21  41*  47*   ALT  29  36  40   BILITOT  0.4  0.3  0.4   ALKPHOS  147*  94  88     No results for input(s): INR in the last 72 hours.  No results for input(s): CKTOTAL, TROPONINI in the last 72 hours.    Urinalysis:    No results found for: NITRU, WBCUA, BACTERIA, RBCUA, BLOODU, [place],  [place]    Radiology:  CTA ABDOMINAL AORTA W BILAT RUNOFF [place]   Final Result   Vascular-      Patent abdominal aorta.      The iliac and lower extremity arteries are overall small in caliber.  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  The left external iliac is patent,   small in caliber.      Femoral popliteal segments are patent bilaterally.      The right trifurcation vessels are not visualized below the mid tibia,   perhaps due to dilution.  On the left, there is 3 vessel runoff, although   plantar arch is not visualized.      Nonvascular-      Gastrointestinal.  Low colonic resection has been performed, with apparent   rectal stump, suggesting abdominal peritoneal resection.  There is a right   lower quadrant colostomy.  Suture over the lower descending colon is   indeterminate for stump versus anastomosis.      Peritoneal/retroperitoneal.  Intraperitoneal fluid is most collected in the   dependent pelvis.  Pelvic percutaneous drain remains in position in the right   lower pelvis.  It would be helpful to position the patient right decubitus   for additional drainage.      Abdominal wall.  Open wounds in the midline and left lower pelvis with likely   fistulous tracts.      Urinary.  Bilateral percutaneous nephrostomy tubes and left ureteral stent   are normal positions.  Right ureteral occlusion device is present.  There is   no hydronephrosis.         IR  ABSCESS DRAIN RETROPERITONEAL PERCUTANEOUS   Final Result   Successful placement of 14 French percutaneous drain to the left lower   quadrant/pelvis.         IR Fluoroscopy Guided Central Venous Access Device Placement   Final Result   Successful placement of right jugular triple-lumen venous catheter, by   sonographic and fluoroscopic guidance.         XR Chest Portable   Final Result   Satisfactory position endotracheal tube.      No acute cardiac or pulmonary disease.         CT ABDOMEN PELVIS WO IV CONTRAST Additional Contrast? None   Final  Result   1. Moderate amount of complex intraperitoneal and extraperitoneal fluid with   multiple locules of air concerning for an infectious process including   possible anastomotic leak in the absence of recent surgery, limited by the   lack of intravenous and enteric contrast.         CT Head WO Contrast   Final Result   No acute intracranial abnormality.         XR Chest Portable   Final Result   The lungs are hyperinflated, but there is no acute finding in the chest.                 Assessment/Plan:    Active Hospital Problems    Diagnosis Date Noted   ??? Abscess [L02.91]    ??? Dehydration [E86.0]    ??? 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 Gangrene of foot ([place]) [I96]    ??? Postprocedural intra-abdominal sepsis ([place]) [T81.4XXA, A41.9]    ??? 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 Cachexia ([place]) [R64]    ??? AKI (acute kidney injury) ([place]) [N17.9]    ??? Hyperkalemia [E87.5]    ??? Open wnd ant abdomen-comp [S31.109A]    ??? Peritonitis and retroperitoneal infections ([place]) [K65.9]    ??? 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 Severe sepsis 2/2 peritonitis - has multiple open abdominal wounds - continue conservative management w/ IV antibiotics - zosyn/flagyl, bowel rest, TPN, wound care. General Surg consulting. Possible repeat CT abd/pelvis on Monday.   ID consulted.  2. RLE ischemia/ gangrene - R foot - distal great toe and toes 2-5 black discoloration as well as anterior forefoot. S/P thrombectomy per Vasc Surg on 3/16. DP now palpable on R foot. On zosyn/flagyl/vanc.  3. AKI - POA - resolved.  4. Acute respiratory failure - resolved, extubated 3/17. Pt was electively intubated for procedures strictly   Pulmonologist following.??  5. 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       DVT Prophylaxis: heparin gtt  Diet: Diet NPO Effective Now  PN-ADULT PREMIXED (CENTRAL) SOLUTION WITH ELECTROLYTES  Code Status: Full Code    Brayton Mars, MD

## 2015-06-29 NOTE — Progress Notes (Signed)
06/29/15 0021   Oxygen Therapy/Pulse Ox   O2 Therapy Oxygen   O2 Device Nasal cannula   Resp 13   O2 Flow Rate (L/min) 2 L/min   SpO2 100 %   Pulse Oximeter Device Mode Continuous   Pulse Oximeter Device Location Finger   End Tidal CO2 19 (%)

## 2015-06-29 NOTE — Progress Notes (Signed)
Bedside report received.  Assessment completed.  Complains of discomfort with any movement.  Bilateral breath sounds audible with few scattered rhonchi.  Encouraged to take deep breaths.  Bed rotation maintained.  Toes of right foot black in color.  Does not complain of foot discomfort.  Pleas refer to [facility] flow sheet for assessment documentation.  Family member at bedside

## 2015-06-29 NOTE — Progress Notes (Signed)
Resting quietly with eyes closed.  VSS, afebrile.  Tolerated dressing change well with pre-med. Dilaudid 1 mg.  Family at bedside while pt. sleeps.  Bed placed in rotation mode 20 degrees left/supine/right, this appears to be more comfortable for pt. Position change every 30 minutes. Call light in reach.  Bed alarm intact and pt. Checked on frequently.

## 2015-06-29 NOTE — Progress Notes (Signed)
Patient has been resting most of the day, when fully awake she is tearful and sad. Some drains leaking.  Have done well to keep patient clean and dry.  However may need to change coloplast dome tomorrow.  TPN cont. No output from ostomy through the day. Potassium replacement as per protocol.  Abx as ordered.  Lower extremities elevated.  Dr. Pleas Patricia is aware of patient's toes becoming more dark.  Patient does not complain about pain to right foot. Mother plans to come from [place] on Wednesday with patient's son.  Bed kept on rotation to prevent further injury to existing wounds and prevent new injury and comfort as well.

## 2015-06-29 NOTE — Progress Notes (Signed)
06/29/15 0421   Oxygen Therapy/Pulse Ox   O2 Therapy Room air   O2 Device None (Room air)   Resp 25   SpO2 100 %   Pulse Oximeter Device Mode Continuous   Pulse Oximeter Device Location Forehead  (site changed)   End Tidal CO2 18 (%)

## 2015-06-29 NOTE — Plan of Care (Signed)
Problem: Risk for Impaired Skin Integrity  Goal: Tissue integrity - skin and mucous membranes  Structural intactness and normal physiological function of skin and  mucous membranes.   Outcome: Ongoing

## 2015-06-29 NOTE — Progress Notes (Signed)
Erica Harding is a 45 y.o. female patient.  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 [START ON 06/30/2015] influenza quadrivalent split vaccine (FLUZONE;FLUARIX) injection 0.5 mL  0.5 mL Intramuscular Once Maureen Ralphs, MD       ??? magnesium sulfate 2 g in 50 mL IVPB premix  2 g Intravenous Once Elly Modena, MD 25 mL/hr at 06/29/15 0955 2 g at 06/29/15 0955   ??? PN-Adult Premix 5/20 - Standard Electrolytes - Central Line   Intravenous Continuous TPN Elly Modena, MD       ??? PN-ADULT PREMIXED (CENTRAL) SOLUTION WITH ELECTROLYTES   Intravenous Continuous TPN Elly Modena, MD 40 mL/hr at 06/28/15 1851     ??? aspirin chewable tablet 81 mg  81 mg Per NG tube Daily Neldon Mc, MD   Stopped at 06/28/15 1659   ??? clopidogrel (PLAVIX) tablet 75 mg  75 mg Per NG tube Daily Neldon Mc, MD   Stopped at 06/28/15 1659   ??? acetaminophen (OFIRMEV) infusion 623 mg  15 mg/kg Intravenous Q6H PRN Maureen Ralphs, MD       ??? ipratropium-albuterol (DUONEB) nebulizer solution 1 ampule  1 ampule Inhalation Q4H WA Elly Modena, MD   1 ampule at 06/29/15 [id number]   ??? ipratropium-albuterol (DUONEB) nebulizer solution 1 ampule  1 ampule Inhalation Q4H PRN Elly Modena, MD       ??? magnesium hydroxide (MILK OF MAGNESIA) 400 MG/5ML suspension 30 mL  30 mL Oral Daily PRN Domenick Bookbinder II, MD       ??? glucose (GLUTOSE) 40 % oral gel 15 g  15 g Oral PRN Siddharth K Mushrif, MD       ??? dextrose 50 % solution 12.5 g  12.5 g Intravenous PRN Siddharth K Mushrif, MD   12.5 g at 06/26/15 0010   ??? glucagon (rDNA) injection 1 mg  1 mg Intramuscular PRN Siddharth K Mushrif, MD       ??? dextrose 5 % solution  100 mL/hr Intravenous PRN Siddharth K Mushrif, MD       ??? [START ON 07/03/2015] fat emulsion 20 % infusion 250  mL  250 mL Intravenous Once per day on Mon Thu Elly Modena, MD       ??? insulin lispro (HUMALOG) injection pen 0-6 Units  0-6 Units Subcutaneous Q4H Elly Modena, MD   1 Units at 06/29/15 [phone]   ??? sodium chloride flush 0.9 % injection 10 mL  10 mL Intravenous 2 times per day Brigitte Pulse, MD   10 mL at 06/29/15 0954   ??? potassium chloride 10 mEq/100 mL IVPB (Peripheral Line)  10 mEq Intravenous PRN Brigitte Pulse, MD       ??? potassium chloride 20 mEq/50 mL IVPB (Central Line)  20 mEq Intravenous PRN Brigitte Pulse, MD 50 mL/hr at 06/29/15 0314 20 mEq at 06/29/15 0314   ??? magnesium sulfate 1 g in dextrose 5% 100 mL IVPB premix  1 g Intravenous PRN Brigitte Pulse, MD       ??? acetaminophen (TYLENOL) tablet 650 mg  650 mg Oral Q4H PRN Brigitte Pulse, MD       ??? ondansetron (ZOFRAN) injection 4 mg  4 mg Intravenous Q6H PRN Brigitte Pulse, MD       ??? famotidine (PEPCID)  injection 20 mg  20 mg Intravenous Daily Brigitte Pulse, MD   20 mg at 06/29/15 0856   ??? fluconazole (DIFLUCAN) 200 mg in 0.9 % NaCl 100 mL premix IVPB  200 mg Intravenous Q24H Brigitte Pulse, MD 100 mL/hr at 06/29/15 0856 200 mg at 06/29/15 0856   ??? metronidazole (FLAGYL) 500 mg in NaCl 100 mL IVPB premix  500 mg Intravenous Q8H Brigitte Pulse, MD   Stopped at 06/29/15 [phone]   ??? piperacillin-tazobactam (ZOSYN) 3.375 g in dextrose 50 mL IVPB extended infusion (premix)  3.375 g Intravenous Q8H Brigitte Pulse, MD 12.5 mL/hr at 06/29/15 0856 3.375 g at 06/29/15 0856   ??? sodium chloride flush 0.9 % injection 10 mL  10 mL Intravenous 2 times per day Delorse Limber, APRN   10 mL at 06/29/15 0856   ??? vancomycin (VANCOCIN) 500 mg in dextrose 5% 100 mL IVPB  500 mg Intravenous Q24H Brigitte Pulse, MD 100 mL/hr at 06/29/15 0951 500 mg at 06/29/15 0951   ??? fentaNYL (DURAGESIC) 25 MCG/HR 1 patch  1 patch Transdermal Q72H Brigitte Pulse, MD   1 patch at 06/28/15 2126   ??? HYDROmorphone (DILAUDID) injection 0.5 mg  0.5 mg Intravenous Q2H PRN  Brigitte Pulse, MD   0.5 mg at 06/26/15 [id number]    Or   ??? HYDROmorphone (DILAUDID) injection 1 mg  1 mg Intravenous Q2H PRN Brigitte Pulse, MD   1 mg at 06/29/15 0650     Allergies   Allergen Reactions   ??? Latex Itching   ??? Compazine [Prochlorperazine Maleate] Swelling     Active Problems:    AKI (acute kidney injury) ([place])    Hyperkalemia    Open wnd ant abdomen-comp    Peritonitis and retroperitoneal infections (HCC)    Fistula    Ischemic foot    Cachexia (HCC)    Postprocedural intra-abdominal sepsis (HCC)    Abscess    Dehydration    Encephalopathies    Gangrene of foot (HCC)    Blood pressure 110/79, pulse 83, temperature 97.8 ??F (36.6 ??C), temperature source Temporal, resp. rate 11, height 5\' 2"  (1.575 m), weight 95 lb 7.4 oz (43.3 kg), SpO2 100 %.    Subjective patient awake but lethargic  Objective:  Vital signs: (most recent): Blood pressure 110/79, pulse 83, temperature 97.8 ??F (36.6 ??C), temperature source Temporal, resp. rate 11, height 5\' 2"  (1.575 m), weight 95 lb 7.4 oz (43.3 kg), SpO2 100 %.    Output: Producing urine.    Lungs:  Normal respiratory rate and normal effort.    abdomen soft  Enteric output from fistulas and drain    Assessment & Plan 45 year old female admitted with sepsis, multiple intestinal fistulas and a pelvic abscess. She is S/P percutaneous drainage of the pelvic abscess. Extubated over last 24 hours. Continue bowel rest, TPN and broad spectrum antibiotics. Repeat CT scan on Monday ( 07/01/15 ).      Elly Modena, MD  06/29/2015

## 2015-06-29 NOTE — Progress Notes (Signed)
[facility]  ICU F/U Note    Subjective:   CC / HPI: sepsis, intraabdominal abscess  Patient was liberated from vent support, requiring opiates for pain control and sleepy this am    PMH: Reviewed, no changes    PSH: Reviewed, no changes    Review of Systems  unobtainable    Objective:   Data Review:  Vital Signs:   Visit Vitals   ??? BP 110/79   ??? Pulse 83   ??? Temp 97.8 ??F (36.6 ??C) (Temporal)   ??? Resp 11   ??? Ht 5\' 2"  (1.575 m)   ??? Wt 95 lb 7.4 oz (43.3 kg)   ??? SpO2 100%   ??? BMI 17.46 kg/m2       24 Hour I&O Review:     Intake/Output Summary (Last 24 hours) at 06/29/15 0953  Last data filed at 06/29/15 0400   Gross per 24 hour   Intake             3613 ml   Output             1020 ml   Net             2593 ml     Continuous Infusions:  ??? PN-Adult Premix 5/20 - Standard Electrolytes - Central Line 40 mL/hr at 06/28/15 1851   ??? sodium chloride 100 mL/hr at 06/29/15 0635   ??? fentaNYL (SUBLIMAZE) infusion     ??? dextrose     ??? propofol 10 mcg/kg/min (06/27/15 2304)   ??? [START ON 07/03/2015] fat emulsion       Current Medications: Current Facility-Administered Medications: [START ON 06/30/2015] influenza quadrivalent split vaccine (FLUZONE;FLUARIX) injection 0.5 mL, 0.5 mL, Intramuscular, Once  magnesium sulfate 2 g in 50 mL IVPB premix, 2 g, Intravenous, Once  PN-ADULT PREMIXED (CENTRAL) SOLUTION WITH ELECTROLYTES, , Intravenous, Continuous TPN  aspirin chewable tablet 81 mg, 81 mg, Per NG tube, Daily  clopidogrel (PLAVIX) tablet 75 mg, 75 mg, Per NG tube, Daily  0.9 % sodium chloride infusion, , Intravenous, Continuous  acetaminophen (OFIRMEV) infusion 623 mg, 15 mg/kg, Intravenous, Q6H PRN  ipratropium-albuterol (DUONEB) nebulizer solution 1 ampule, 1 ampule, Inhalation, Q4H WA  ipratropium-albuterol (DUONEB) nebulizer solution 1 ampule, 1 ampule, Inhalation, Q4H PRN  fentaNYL (SUBLIMAZE) 1,000 mcg in sodium chloride 0.9 % 100 mL infusion, 0.5 mcg/kg/hr, Intravenous, Continuous  magnesium hydroxide  (MILK OF MAGNESIA) 400 MG/5ML suspension 30 mL, 30 mL, Oral, Daily PRN  glucose (GLUTOSE) 40 % oral gel 15 g, 15 g, Oral, PRN  dextrose 50 % solution 12.5 g, 12.5 g, Intravenous, PRN  glucagon (rDNA) injection 1 mg, 1 mg, Intramuscular, PRN  dextrose 5 % solution, 100 mL/hr, Intravenous, PRN  propofol 1000 MG/100ML injection, 10 mcg/kg/min, Intravenous, Titrated  [START ON 07/03/2015] fat emulsion 20 % infusion 250 mL, 250 mL, Intravenous, Once per day on Mon Thu  insulin lispro (HUMALOG) injection pen 0-6 Units, 0-6 Units, Subcutaneous, Q4H  sodium chloride flush 0.9 % injection 10 mL, 10 mL, Intravenous, 2 times per day  potassium chloride 10 mEq/100 mL IVPB (Peripheral Line), 10 mEq, Intravenous, PRN  potassium chloride 20 mEq/50 mL IVPB (Central Line), 20 mEq, Intravenous, PRN  magnesium sulfate 1 g in dextrose 5% 100 mL IVPB premix, 1 g, Intravenous, PRN  acetaminophen (TYLENOL) tablet 650 mg, 650 mg, Oral, Q4H PRN  ondansetron (ZOFRAN) injection 4 mg, 4 mg, Intravenous, Q6H PRN  famotidine (PEPCID) injection 20 mg, 20 mg,  Intravenous, Daily  fluconazole (DIFLUCAN) 200 mg in 0.9 % NaCl 100 mL premix IVPB, 200 mg, Intravenous, Q24H  metronidazole (FLAGYL) 500 mg in NaCl 100 mL IVPB premix, 500 mg, Intravenous, Q8H  piperacillin-tazobactam (ZOSYN) 3.375 g in dextrose 50 mL IVPB extended infusion (premix), 3.375 g, Intravenous, Q8H  sodium chloride flush 0.9 % injection 10 mL, 10 mL, Intravenous, 2 times per day  vancomycin (VANCOCIN) 500 mg in dextrose 5% 100 mL IVPB, 500 mg, Intravenous, Q24H  fentaNYL (DURAGESIC) 25 MCG/HR 1 patch, 1 patch, Transdermal, Q72H  HYDROmorphone (DILAUDID) injection 0.5 mg, 0.5 mg, Intravenous, Q2H PRN **OR** HYDROmorphone (DILAUDID) injection 1 mg, 1 mg, Intravenous, Q2H PRN    CBC:  Recent Labs      06/26/15   1110  06/27/15   0540  06/27/15   2115  06/28/15   0450  06/29/15   0615   WBC  31.4*  34.8*  32.4*  28.3*  21.4*   RBC  3.03*  3.24*  3.53*  3.37*  2.93*   HGB  7.9*  8.2*   9.6*  9.2*  8.1*   HCT  26.6*  28.4*  31.0*  29.6*  26.2*   PLT  122*  101*  58*  77*  88*   MCV  87.6  87.8  87.8  87.7  89.5   MCH  26.0  25.3*  27.1  27.2  27.5   MCHC  29.6*  28.9*  30.9*  31.0  30.7*   RDW  24.0*  24.9*  20.2*  20.4*  21.7*   NRBC   --   1*   --    --    --    BANDSPCT  6  16*   --    --    --       BMP:  Recent Labs      06/28/15   0450  06/29/15   0005  06/29/15   0615   NA  140  145  144   145   K  3.6  3.2*  4.1   4.2   CL  110  118*  118*   117*   CO2  15*  15*  14*   14*   BUN  35*  33*  32*   33*   CREATININE  1.1  1.2*  1.1   1.1   CALCIUM  6.8*  7.0*  6.9*   6.9*   GLUCOSE  139*  165*  161*   172*        Radiology Review:  Pertinent images / reports were reviewed as a part of this visit.     Physical Exam   Constitutional: Awake  HENT: No oropharyngeal exudate.   Eyes: Pupils are equal, round, and reactive to light.   Neck: No JVD present. No tracheal deviation present.   Cardiovascular: regular rhythm, S1&S2 and intact distal pulses.    Pulmonary/Chest: bilateral breath sounds. No wheezes, rhonchi, rales or tenderness.   Abdominal: previous abdominal wounds noted with exposure of intra abdominal fluids   Musculoskeletal: No edema and no tenderness. ischemic changes of R>L digits with no pulse appreciated on the R foot  Neurological: Non focal.    Assessment:     1. Intra abdominal infection with abscess  2. Sepsis 2/2 above  3. AKI  4. Hx of colorectal ca  5. Ischemic foot, acute   Plan:     VS reviewed as above. Stress ulcer, DVT, and  line protocols are being followed if not contraindicated.  Hyperchloremic with acidosis on renal panel  D/C IVF  Pain control  Continue abx  On TPN now  Discussed with ICU team      Total critical care time caring for this patient with life threatening illness, including direct patient contact, management of life support systems, review of data including imaging and labs, discussions with other team members and physicians is at least 31 minutes so far  today, excluding procedures.  ??  ??

## 2015-06-29 NOTE — Progress Notes (Signed)
Medicated for complaint of discomfort.  Please refer to [facility]

## 2015-06-29 NOTE — Progress Notes (Signed)
Vascular    Strong DP and PT pulses R Foot.  Toes demarcated.    Will likely need TMA at some point.

## 2015-06-30 LAB — COMPREHENSIVE METABOLIC PANEL
ALT: 49 U/L — ABNORMAL HIGH (ref 10–40)
ALT: 50 U/L — ABNORMAL HIGH (ref 10–40)
ALT: 44 U/L — ABNORMAL HIGH (ref 10–40)
AST: 48 U/L — ABNORMAL HIGH (ref 15–37)
AST: 48 U/L — ABNORMAL HIGH (ref 15–37)
AST: 32 U/L (ref 15–37)
Albumin/Globulin Ratio: 0.5 — ABNORMAL LOW (ref 1.1–2.2)
Albumin/Globulin Ratio: 0.4 — ABNORMAL LOW (ref 1.1–2.2)
Albumin/Globulin Ratio: 0.5 — ABNORMAL LOW (ref 1.1–2.2)
Albumin: 1.2 g/dL — ABNORMAL LOW (ref 3.4–5.0)
Albumin: 1.1 g/dL — ABNORMAL LOW (ref 3.4–5.0)
Albumin: 1.1 g/dL — ABNORMAL LOW (ref 3.4–5.0)
Alkaline Phosphatase: 87 U/L (ref 40–129)
Alkaline Phosphatase: 107 U/L (ref 40–129)
Alkaline Phosphatase: 94 U/L (ref 40–129)
Anion Gap: 11 (ref 3–16)
Anion Gap: 11 (ref 3–16)
Anion Gap: 10 (ref 3–16)
BUN: 30 mg/dL — ABNORMAL HIGH (ref 7–20)
BUN: 30 mg/dL — ABNORMAL HIGH (ref 7–20)
BUN: 28 mg/dL — ABNORMAL HIGH (ref 7–20)
CO2: 15 mmol/L — ABNORMAL LOW (ref 21–32)
CO2: 15 mmol/L — ABNORMAL LOW (ref 21–32)
CO2: 15 mmol/L — ABNORMAL LOW (ref 21–32)
Calcium: 7.3 mg/dL — ABNORMAL LOW (ref 8.3–10.6)
Calcium: 7.5 mg/dL — ABNORMAL LOW (ref 8.3–10.6)
Calcium: 7.2 mg/dL — ABNORMAL LOW (ref 8.3–10.6)
Chloride: 120 mmol/L — ABNORMAL HIGH (ref 99–110)
Chloride: 119 mmol/L — ABNORMAL HIGH (ref 99–110)
Chloride: 117 mmol/L — ABNORMAL HIGH (ref 99–110)
Creatinine: 1 mg/dL (ref 0.6–1.1)
Creatinine: 0.9 mg/dL (ref 0.6–1.1)
Creatinine: 0.8 mg/dL (ref 0.6–1.1)
GFR African American: >60 (ref >60)
GFR African American: >60 (ref >60)
GFR African American: >60 (ref >60)
GFR Non-African American: >60 (ref >60)
GFR Non-African American: >60 (ref >60)
GFR Non-African American: >60 (ref >60)
Globulin: 2.6 g/dL
Globulin: 2.7 g/dL
Globulin: 2.4 g/dL
Glucose: 205 mg/dL — ABNORMAL HIGH (ref 70–99)
Glucose: 177 mg/dL — ABNORMAL HIGH (ref 70–99)
Glucose: 163 mg/dL — ABNORMAL HIGH (ref 70–99)
Potassium: 3.9 mmol/L (ref 3.5–5.1)
Potassium: 3.9 mmol/L (ref 3.5–5.1)
Potassium: 3.1 mmol/L — ABNORMAL LOW (ref 3.5–5.1)
Sodium: 146 mmol/L — ABNORMAL HIGH (ref 136–145)
Sodium: 145 mmol/L (ref 136–145)
Sodium: 142 mmol/L (ref 136–145)
Total Bilirubin: 0.4 mg/dL (ref 0.0–1.0)
Total Bilirubin: 0.4 mg/dL (ref 0.0–1.0)
Total Bilirubin: 0.3 mg/dL (ref 0.0–1.0)
Total Protein: 3.8 g/dL — ABNORMAL LOW (ref 6.4–8.2)
Total Protein: 3.8 g/dL — ABNORMAL LOW (ref 6.4–8.2)
Total Protein: 3.5 g/dL — ABNORMAL LOW (ref 6.4–8.2)

## 2015-06-30 LAB — CULTURE, ANAEROBIC AND AEROBIC

## 2015-06-30 LAB — BASIC METABOLIC PANEL
Anion Gap: 12 (ref 3–16)
BUN: 30 mg/dL — ABNORMAL HIGH (ref 7–20)
CO2: 15 mmol/L — ABNORMAL LOW (ref 21–32)
Calcium: 7.4 mg/dL — ABNORMAL LOW (ref 8.3–10.6)
Chloride: 120 mmol/L — ABNORMAL HIGH (ref 99–110)
Creatinine: 0.9 mg/dL (ref 0.6–1.1)
GFR African American: >60 (ref >60)
GFR Non-African American: >60 (ref >60)
Glucose: 167 mg/dL — ABNORMAL HIGH (ref 70–99)
Potassium: 3.7 mmol/L (ref 3.5–5.1)
Sodium: 147 mmol/L — ABNORMAL HIGH (ref 136–145)

## 2015-06-30 LAB — CBC WITH AUTO DIFFERENTIAL
Basophils %: 0.2 %
Basophils Absolute: 0 10*3/uL (ref 0.0–0.2)
Eosinophils %: 0.1 %
Eosinophils Absolute: 0 10*3/uL (ref 0.0–0.6)
Hematocrit: 24.3 % — ABNORMAL LOW (ref 36.0–48.0)
Hemoglobin: 7.5 g/dL — ABNORMAL LOW (ref 12.0–16.0)
Lymphocytes %: 17.3 %
Lymphocytes Absolute: 2 10*3/uL (ref 1.0–5.1)
MCH: 27.9 pg (ref 26.0–34.0)
MCHC: 31 g/dL (ref 31.0–36.0)
MCV: 89.9 fL (ref 80.0–100.0)
MPV: 8.3 fL (ref 5.0–10.5)
Monocytes %: 3.9 %
Monocytes Absolute: 0.5 10*3/uL (ref 0.0–1.3)
Neutrophils %: 78.5 %
Neutrophils Absolute: 9.1 10*3/uL — ABNORMAL HIGH (ref 1.7–7.7)
PLATELET SLIDE REVIEW: DECREASED
Platelets: 77 10*3/uL — ABNORMAL LOW (ref 135–450)
RBC: 2.7 M/uL — ABNORMAL LOW (ref 4.00–5.20)
RDW: 22.1 % — ABNORMAL HIGH (ref 12.4–15.4)
WBC: 11.6 10*3/uL — ABNORMAL HIGH (ref 4.0–11.0)

## 2015-06-30 LAB — CALCIUM, IONIZED
Calcium, Ionized: 1.17 mmol/L (ref 1.12–1.32)
pH, Ven: 7.33 — ABNORMAL LOW (ref 7.35–7.45)

## 2015-06-30 LAB — POCT GLUCOSE
POC Glucose: 162 mg/dl — ABNORMAL HIGH (ref 70–99)
POC Glucose: 165 mg/dl — ABNORMAL HIGH (ref 70–99)
POC Glucose: 177 mg/dl — ABNORMAL HIGH (ref 70–99)
POC Glucose: 193 mg/dl — ABNORMAL HIGH (ref 70–99)
POC Glucose: 201 mg/dl — ABNORMAL HIGH (ref 70–99)
POC Glucose: 202 mg/dl — ABNORMAL HIGH (ref 70–99)

## 2015-06-30 LAB — CULTURE, BLOOD 2: Culture, Blood 2: NO GROWTH

## 2015-06-30 LAB — MAGNESIUM: Magnesium: 2.1 mg/dL (ref 1.80–2.40)

## 2015-06-30 MED ORDER — CLINIMIX E/DEXTROSE (5/20) 5 % IV SOLN
5 % | INTRAVENOUS | Status: AC
Start: 2015-06-30 — End: 2015-07-01
  Administered 2015-06-30: 23:00:00 via INTRAVENOUS

## 2015-06-30 MED ORDER — SODIUM CHLORIDE 0.45 % IV SOLN
0.45 % | INTRAVENOUS | Status: AC
Start: 2015-06-30 — End: 2015-06-30
  Administered 2015-06-30: 18:00:00 1000

## 2015-06-30 MED ORDER — SODIUM CHLORIDE 0.45 % IV SOLN
0.45 % | INTRAVENOUS | Status: DC
Start: 2015-06-30 — End: 2015-07-03
  Administered 2015-06-30 – 2015-07-03 (×5): via INTRAVENOUS

## 2015-06-30 MED ORDER — SODIUM CHLORIDE 0.9 % IV SOLN
0.9 % | INTRAVENOUS | Status: AC
Start: 2015-06-30 — End: 2015-06-30
  Administered 2015-06-30: 06:00:00

## 2015-06-30 MED ORDER — INFLUENZA VAC SPLIT QUAD 0.5 ML IM SUSY
0.5 ML | Freq: Once | INTRAMUSCULAR | Status: AC
Start: 2015-06-30 — End: 2015-07-04
  Administered 2015-07-04: 13:00:00 0.5 mL via INTRAMUSCULAR

## 2015-06-30 MED FILL — ZOSYN 3-0.375 GM/50ML IV SOLN: INTRAVENOUS | Qty: 50

## 2015-06-30 MED FILL — HYDROMORPHONE HCL 1 MG/ML IJ SOLN: 1 MG/ML | INTRAMUSCULAR | Qty: 1

## 2015-06-30 MED FILL — NORMAL SALINE FLUSH 0.9 % IV SOLN: 0.9 % | INTRAVENOUS | Qty: 10

## 2015-06-30 MED FILL — SODIUM CHLORIDE 0.45 % IV SOLN: 0.45 % | INTRAVENOUS | Qty: 1000

## 2015-06-30 MED FILL — FLUCONAZOLE IN SODIUM CHLORIDE 200-0.9 MG/100ML-% IV SOLN: INTRAVENOUS | Qty: 100

## 2015-06-30 MED FILL — CLINIMIX E/DEXTROSE (5/20) 5 % IV SOLN: 5 % | INTRAVENOUS | Qty: 2000

## 2015-06-30 MED FILL — SODIUM CHLORIDE 0.9 % IV SOLN: 0.9 % | INTRAVENOUS | Qty: 500

## 2015-06-30 MED FILL — IPRATROPIUM-ALBUTEROL 0.5-2.5 (3) MG/3ML IN SOLN: RESPIRATORY_TRACT | Qty: 9

## 2015-06-30 MED FILL — VANCOMYCIN HCL IN DEXTROSE 500-5 MG/100ML-% IV SOLN: 500-5 MG/100ML-% | INTRAVENOUS | Qty: 100

## 2015-06-30 MED FILL — METRONIDAZOLE IN NACL 5-0.79 MG/ML-% IV SOLN: 5 mg/mL | INTRAVENOUS | Qty: 100

## 2015-06-30 MED FILL — HYDROMORPHONE HCL 1 MG/ML IJ SOLN: 1 MG/ML | INTRAMUSCULAR | Qty: 0.5

## 2015-06-30 MED FILL — IPRATROPIUM-ALBUTEROL 0.5-2.5 (3) MG/3ML IN SOLN: RESPIRATORY_TRACT | Qty: 3

## 2015-06-30 MED FILL — POTASSIUM CHLORIDE 20 MEQ/50ML IV SOLN: 20 MEQ/50ML | INTRAVENOUS | Qty: 50

## 2015-06-30 MED FILL — NORMAL SALINE FLUSH 0.9 % IV SOLN: 0.9 % | INTRAVENOUS | Qty: 40

## 2015-06-30 MED FILL — FAMOTIDINE 20 MG/2ML IV SOLN: 20 MG/2ML | INTRAVENOUS | Qty: 2

## 2015-06-30 NOTE — Progress Notes (Signed)
Turned and repositioned.  Bathed.  Abdominal dressing changed. Open wounds redressed with wet to dry dressing.  Large amount of thick,foul smelling brown drainage noted from large open wound of abdomin.  Dark green bile draining from other abdominal wound.  Patient premedicated for pain prior to bath and dressing change.  Positioned on side.

## 2015-06-30 NOTE — Progress Notes (Signed)
[facility]  ICU F/U Note    Subjective:   CC / HPI: sepsis, intraabdominal abscess  Patient is awake on RA this am    PMH: Reviewed, no changes    PSH: Reviewed, no changes    Review of Systems    Constitutional: Negative for fever, chills, diaphoresis.  Respiratory: See HPI  Cardiovascular: Negative for chest pain, palpitations and leg swelling.   Gastrointestinal: Negative for abdominal pain, blood in stool, abdominal distention and anal bleeding.   Genitourinary: Negative for dysuria, urgency, frequency, hematuria, decreased urine volume, and enuresis.   Skin: Negative.  Negative for color change and pallor.   Neurological: No dizziness, lightheaded, loss of consciousness, HA or neck stiffness      Objective:   Data Review:  Vital Signs:   Visit Vitals   ??? BP 111/85   ??? Pulse 104   ??? Temp 97.8 ??F (36.6 ??C) (Temporal)   ??? Resp 22   ??? Ht 5\' 2"  (1.575 m)   ??? Wt 102 lb 1.2 oz (46.3 kg)   ??? SpO2 100%   ??? BMI 18.67 kg/m2       24 Hour I&O Review:     Intake/Output Summary (Last 24 hours) at 06/30/15 0952  Last data filed at 06/30/15 0547   Gross per 24 hour   Intake           2276.3 ml   Output             1155 ml   Net           1121.3 ml     Continuous Infusions:  ??? PN-Adult Premix 5/20 - Standard Electrolytes - Central Line     ??? PN-Adult Premix 5/20 - Standard Electrolytes - Central Line 50 mL/hr at 06/29/15 1751   ??? dextrose     ??? [START ON 07/03/2015] fat emulsion       Current Medications: Current Facility-Administered Medications: [START ON 07/03/2015] influenza quadrivalent split vaccine (FLUZONE;FLUARIX) injection 0.5 mL, 0.5 mL, Intramuscular, Once  PN-Adult Premix 5/20 - Standard Electrolytes - Central Line, , Intravenous, Continuous TPN  PN-Adult Premix 5/20 - Standard Electrolytes - Central Line, , Intravenous, Continuous TPN  aspirin chewable tablet 81 mg, 81 mg, Per NG tube, Daily  clopidogrel (PLAVIX) tablet 75 mg, 75 mg, Per NG tube, Daily  acetaminophen (OFIRMEV) infusion 623 mg,  15 mg/kg, Intravenous, Q6H PRN  ipratropium-albuterol (DUONEB) nebulizer solution 1 ampule, 1 ampule, Inhalation, Q4H WA  ipratropium-albuterol (DUONEB) nebulizer solution 1 ampule, 1 ampule, Inhalation, Q4H PRN  magnesium hydroxide (MILK OF MAGNESIA) 400 MG/5ML suspension 30 mL, 30 mL, Oral, Daily PRN  glucose (GLUTOSE) 40 % oral gel 15 g, 15 g, Oral, PRN  dextrose 50 % solution 12.5 g, 12.5 g, Intravenous, PRN  glucagon (rDNA) injection 1 mg, 1 mg, Intramuscular, PRN  dextrose 5 % solution, 100 mL/hr, Intravenous, PRN  [START ON 07/03/2015] fat emulsion 20 % infusion 250 mL, 250 mL, Intravenous, Once per day on Mon Thu  insulin lispro (HUMALOG) injection pen 0-6 Units, 0-6 Units, Subcutaneous, Q4H  sodium chloride flush 0.9 % injection 10 mL, 10 mL, Intravenous, 2 times per day  potassium chloride 10 mEq/100 mL IVPB (Peripheral Line), 10 mEq, Intravenous, PRN  potassium chloride 20 mEq/50 mL IVPB (Central Line), 20 mEq, Intravenous, PRN  magnesium sulfate 1 g in dextrose 5% 100 mL IVPB premix, 1 g, Intravenous, PRN  acetaminophen (TYLENOL) tablet 650 mg, 650 mg, Oral, Q4H  PRN  ondansetron (ZOFRAN) injection 4 mg, 4 mg, Intravenous, Q6H PRN  famotidine (PEPCID) injection 20 mg, 20 mg, Intravenous, Daily  fluconazole (DIFLUCAN) 200 mg in 0.9 % NaCl 100 mL premix IVPB, 200 mg, Intravenous, Q24H  metronidazole (FLAGYL) 500 mg in NaCl 100 mL IVPB premix, 500 mg, Intravenous, Q8H  piperacillin-tazobactam (ZOSYN) 3.375 g in dextrose 50 mL IVPB extended infusion (premix), 3.375 g, Intravenous, Q8H  sodium chloride flush 0.9 % injection 10 mL, 10 mL, Intravenous, 2 times per day  vancomycin (VANCOCIN) 500 mg in dextrose 5% 100 mL IVPB, 500 mg, Intravenous, Q24H  fentaNYL (DURAGESIC) 25 MCG/HR 1 patch, 1 patch, Transdermal, Q72H  HYDROmorphone (DILAUDID) injection 0.5 mg, 0.5 mg, Intravenous, Q2H PRN **OR** HYDROmorphone (DILAUDID) injection 1 mg, 1 mg, Intravenous, Q2H PRN    CBC:  Recent Labs      06/28/15   0450   06/29/15   0615  06/30/15   0530   WBC  28.3*  21.4*  11.6*   RBC  3.37*  2.93*  2.70*   HGB  9.2*  8.1*  7.5*   HCT  29.6*  26.2*  24.3*   PLT  77*  88*  77*   MCV  87.7  89.5  89.9   MCH  27.2  27.5  27.9   MCHC  31.0  30.7*  31.0   RDW  20.4*  21.7*  22.1*      BMP:  Recent Labs      06/30/15   0000  06/30/15   0530  06/30/15   0830   NA  146*  147*  145   K  3.9  3.7  3.9   CL  120*  120*  119*   CO2  15*  15*  15*   BUN  30*  30*  30*   CREATININE  1.0  0.9  0.9   CALCIUM  7.3*  7.4*  7.5*   GLUCOSE  205*  167*  177*        Radiology Review:  Pertinent images / reports were reviewed as a part of this visit.     Physical Exam   Constitutional: Awake  HENT: No oropharyngeal exudate.   Eyes: Pupils are equal, round, and reactive to light.   Neck: No JVD present. No tracheal deviation present.   Cardiovascular: regular rhythm, S1&S2 and intact distal pulses.    Pulmonary/Chest: bilateral breath sounds. No wheezes, rhonchi, rales or tenderness.   Abdominal: previous abdominal wounds noted with exposure of intra abdominal fluids   Musculoskeletal: No edema and no tenderness. ischemic changes of R>L digits with no pulse appreciated on the R foot  Neurological: Non focal.    Assessment:     1. Intra abdominal infection with abscess  2. Sepsis 2/2 above  3. AKI  4. Hx of colorectal ca  5. Ischemic foot, acute   Plan:     VS reviewed as above. Stress ulcer, DVT, and line protocols are being followed if not contraindicated.  She is looking better  Pain control  Continue abx per ID service  Continue TPN   Still with low bicarb, try D5W  Discussed with ICU team, start discharge planning to LTAC      ??  ??

## 2015-06-30 NOTE — Progress Notes (Signed)
Pt. Refuses dressing change right now.  Requests to wait an hour.  Will premedicate with Dilaudid in half an hour as pt. Tolerates poorly.  Visitor at bedside.

## 2015-06-30 NOTE — Plan of Care (Signed)
Problem: Risk for Impaired Skin Integrity  Goal: Tissue integrity - skin and mucous membranes  Structural intactness and normal physiological function of skin and  mucous membranes.   Intervention: SKIN ASSESSMENT  Pt. With pre-existing wounds to coccyx and right hip/thigh.  Pt. Placed on bed rotation every 30 minutes 10 degrees. No further kin problems except right toes/metatarsals.       Problem: Falls - Risk of  Goal: Absence of falls  Outcome: Ongoing  Absent of falls this admission.  SAFE protocol observed.

## 2015-06-30 NOTE — Progress Notes (Signed)
AM lab results noted

## 2015-06-30 NOTE — Progress Notes (Signed)
Reassessment completed.  No significant changes.  Medicated for complaint of abdominal pain

## 2015-06-30 NOTE — Progress Notes (Signed)
Pt. Resting quietly in bed awake.  VSS, afebrile.  HR 103 ST.  Dilaudid for pain.  Dome cover over abd. Leaks.  Drainage from abd incisions is yellow/browm/green bile. Awating Vanco abx.  Message to pharmacy as medication not available. Pt. On bed rotation to freq. Change positions for comfort and further skin injury. Bed alarm intact and call light in reach.

## 2015-06-30 NOTE — Progress Notes (Signed)
Results of midnight labs noted

## 2015-06-30 NOTE — Progress Notes (Signed)
Infectious Disease Follow up Notes  Chief Complaint :    Rt leg ischemia  Abdominal abscess     Antibiotics:   IV Flagyl   IV Zosyn   IV Vancomycin   IV Fluconazole    Admit Date: 06/25/2015  Hospital Day: 6    Subjective:   On going Rt foot pain and feels cold no chills WBC coming down nicely tolerating IV abx ok remains NPO for possible re intervention?  Objective:     Patient Vitals for the past 8 hrs:   BP Temp Temp src Pulse Resp SpO2   06/30/15 1315 116/83 97.8 ??F (36.6 ??C) Temporal 113 20 100 %   06/30/15 1312 - - - - 22 100 %   06/30/15 1215 107/80 - - 102 13 -   06/30/15 1115 108/82 - - 102 14 -   06/30/15 1015 105/77 - - 109 15 -   06/30/15 0915 109/83 - - 107 12 -   06/30/15 0900 (!) 111/93 - - 106 12 -   06/30/15 0815 (!) 149/130 - - 109 19 -   06/30/15 0800 117/77 - - 106 16 -   06/30/15 0758 - - - - 22 100 %   06/30/15 0715 111/84 97.7 ??F (36.5 ??C) Temporal 105 17 100 %       EXAM:  General Appearance: awake, looks ill, pallor+ tongue dry+, , no icterus   Skin: warm and dry, no rash or erythema  Head: normocephalic and atraumatic  Eyes: pupils equal, round, and reactive to light, conjunctivae normal  ENT: tympanic membrane, external ear and ear canal normal bilaterally, nose without deformity, nasal mucosa and turbinates normal without polyps  Neck: supple and non-tender without mass, no thyromegaly, no cervical lymphadenopathy  Pulmonary/Chest: Bi basal crepts, no wheezes, rales or rhonchi, normal air movement, no respiratory distress  Cardiovascular: normal rate, regular rhythm, normal S1 and S2, no murmurs, rubs, clicks, or gallops,no carotid bruits  Abdomen: Drain+ ostomy+++, no masses or organomegaly  Extremities: no cyanosis, clubbing or edema  Musculoskeletal: normal range of motion, no joint swelling, deformity or tenderness  Neurologic: reflexes normal and symmetric, no cranial nerve   Lines:PICC+   Rt foot  ischemia+ reperfusion signs+          Data Review:    Lab Results   Component Value Date    WBC 11.6 (H) 06/30/2015    HGB 7.5 (L) 06/30/2015    HCT 24.3 (L) 06/30/2015    MCV 89.9 06/30/2015    PLT 77 (L) 06/30/2015     Lab Results   Component Value Date    CREATININE 0.9 06/30/2015    BUN 30 (H) 06/30/2015    NA 145 06/30/2015    K 3.9 06/30/2015    CL 119 (H) 06/30/2015    CO2 15 (L) 06/30/2015       Hepatic Function Panel:   Lab Results   Component Value Date    ALKPHOS 107 06/30/2015    ALT 50 06/30/2015    AST 48 06/30/2015    PROT 3.8 06/30/2015    BILITOT 0.4 06/30/2015    LABALBU 1.1 06/30/2015       MICRO:   3/15 IR abscess fluid :   Gram Stain Result (Abnormal)    ?? 4+ WBC's (Polymorphonuclear)   4+ Gram positive cocci ??in chains and pairs- resembling Strep   4+ Gram negative diplococci      ?? WOUND/ABSCESS (Abnormal)   ?? Heavy growth Mixed  enteric flora   Multiple organisms isolated, no predominance. Culture   indicates probable contamination. Please review   clinical indications to determine if a repeat culture   is necessary. No further workup to be done.      ?? Anaerobic Culture   ?? Further report to follow   No anaerobes isolated so far, Further report to follow      ?? Organism (Abnormal)   ?? Enterococcus species   ?? WOUND/ABSCESS   ?? Heavy growth   No further workup   Two colony types      ??   Narrative    ?? ORDER#: [id number] ?? ?? ?? ?? ?? ?? ?? ?? ?? ?? ?? ?? ??ORDERED BY: SUAREZ, GRETCHEN  SOURCE: Abdomen pelvic drain ?? ?? ?? ?? ?? ?? ?? COLLECTED: ??06/26/15 15:54  ANTIBIOTICS AT COLL.: ?? ?? ?? ?? ?? ?? ?? ?? ?? ?? ??RECEIVED : ??06/26/15 19:57   ??     Culture, Blood 2    ?? No Growth to date. ??Any change in status will be called.   ??   Narrative    ?? ORDER#: [id number] ?? ?? ?? ?? ?? ?? ?? ?? ?? ?? ?? ?? ??ORDERED BY: CHEUNG, JENNIFER  SOURCE: Blood ?? ?? ?? ?? ?? ?? ?? ?? ?? ?? ?? ?? ?? ?? ??COLLECTED: ??06/25/15 07:05  ANTIBIOTICS AT COLL.: ?? ?? ?? ?? ?? ?? ?? ?? ?? ?? ??RECEIVED : ??06/25/15 13:06  If child <=2 yrs old please draw pediatric bottle.~Blood Culture  #     Gram Stain Result (Abnormal)    ?? 4+ WBC's (Polymorphonuclear)   4+ Gram positive cocci ??in chains and pairs- resembling Strep   4+ Gram negative diplococci      ?? WOUND/ABSCESS (Abnormal)   ?? Heavy growth Mixed enteric flora   Multiple organisms isolated, no predominance. Culture   indicates probable contamination. Please review   clinical indications to determine if a repeat culture   is necessary. No further workup to be done.      ?? Organism (Abnormal)   ?? Enterococcus faecium   ?? WOUND/ABSCESS   ?? Heavy growth   ?? Organism (Abnormal)   ?? Enterococcus faecalis   ?? WOUND/ABSCESS   ?? Heavy growth   ?? Organism (Abnormal)   ?? Bacteroides fragilis group   ?? Anaerobic Culture   ?? Heavy growth   Beta Lactamase POSITIVE.   Sensitivities not routinely done. Drugs of choice are: Metronidazole,   Cefoxitin, or Piperacillin/Tazobactam.      ??   Narrative    ?? ORDER#: [id number] ?? ?? ?? ?? ?? ?? ?? ?? ?? ?? ?? ?? ??ORDERED BY: SUAREZ, GRETCHEN  SOURCE: Abdomen pelvic drain ?? ?? ?? ?? ?? ?? ?? COLLECTED: ??06/26/15 15:54  ANTIBIOTICS AT COLL.: ?? ?? ?? ?? ?? ?? ?? ?? ?? ?? ??RECEIVED : ??06/26/15 19:57   ??   Culture & Susceptibility    ?? ENTEROCOCCUS FAECIUM    ?? Antibiotic Interpretation MIC Unit   ?? ampicillin Resistant >8 mcg/mL   ?? vancomycin Sensitive 1 mcg/mL   ??    ??      ?? ENTEROCOCCUS FAECALIS    ?? Antibiotic Interpretation MIC Unit   ?? ampicillin Sensitive <=2 mcg/mL   ?? vancomycin Sensitive 2 mcg/mL   ??                 IMAGING: 3/16 CT abd/pelvis   Impression: ??    ?? Vascular-  Patent abdominal aorta.    The iliac and lower extremity arteries are overall small in caliber. 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 ??The left external iliac is patent,  small in caliber.    Femoral popliteal segments are patent bilaterally.    The right trifurcation vessels are not visualized below the mid tibia,  perhaps due to dilution. ??On the left, there is 3 vessel runoff, although  plantar arch is not  visualized.    Nonvascular-    Gastrointestinal. ??Low colonic resection has been performed, with apparent  rectal stump, suggesting abdominal peritoneal resection. ??There is a right  lower quadrant colostomy. ??Suture over the lower descending colon is  indeterminate for stump versus anastomosis.    Peritoneal/retroperitoneal. ??Intraperitoneal fluid is most collected in the  dependent pelvis. ??Pelvic percutaneous drain remains in position in the right  lower pelvis. ??It would be helpful to position the patient right decubitus  for additional drainage.    Abdominal wall. ??Open wounds in the midline and left lower pelvis with likely  fistulous tracts.    Urinary. ??Bilateral percutaneous nephrostomy tubes and left ureteral stent  are normal positions. ??Right ureteral occlusion device is present. ??There is  no hydronephrosis       All the pertinent images and reports for the current Hospitalization were reviewed     Scheduled Meds:  ??? [START ON 07/03/2015] influenza virus vaccine  0.5 mL Intramuscular Once   ??? aspirin  81 mg Per NG tube Daily   ??? clopidogrel  75 mg Per NG tube Daily   ??? ipratropium-albuterol  1 ampule Inhalation Q4H WA   ??? insulin lispro  0-6 Units Subcutaneous Q4H   ??? sodium chloride flush  10 mL Intravenous 2 times per day   ??? famotidine (PEPCID) injection  20 mg Intravenous Daily   ??? fluconazole  200 mg Intravenous Q24H   ??? metroNIDAZOLE  500 mg Intravenous Q8H   ??? piperacillin-tazobactam  3.375 g Intravenous Q8H   ??? sodium chloride flush  10 mL Intravenous 2 times per day   ??? vancomycin  500 mg Intravenous Q24H   ??? fentaNYL  1 patch Transdermal Q72H       Continuous Infusions:  ??? PN-Adult Premix 5/20 - Standard Electrolytes - Central Line     ??? sodium chloride     ??? sodium chloride     ??? PN-Adult Premix 5/20 - Standard Electrolytes - Central Line 50 mL/hr at 06/29/15 1751   ??? dextrose     ??? [START ON 07/03/2015] fat emulsion         PRN Meds:  acetaminophen, ipratropium-albuterol, magnesium hydroxide,  glucose, dextrose, glucagon (rDNA), dextrose, potassium chloride, potassium chloride, magnesium sulfate, acetaminophen, ondansetron, HYDROmorphone **OR** HYDROmorphone      Assessment:     S/p  1. 3rd order RLE angiogram 2. Right EIA/CFA thrombectomy (Angiojet) 3. Right EIA stent for Rt foot ischemia and early gangrene  History of colon cancer s/p resection, radiation, chemotherapy  Complicated course with radiation colitis, peritonitis   Recent prolong admission at OSH  ??  Admitted now with sepsis  Feculent peritonitis  Enterocutaneous fistulae  Cachexia   Ischemic changes feet   S/p IR aspiration of abscess  WBC trending down    Labs, Microbiology, Radiology and pertinent results from care every where were reviewed as a part of the evaluation   Plan:   1. Cont IV Zosyn  2. Cont IV Fluconazole  3. Cont Flagyl IV  4. Cont  IV Vancomycin for the enterococcus  5. IR aspirate bowel flora   6.Remains critically ill     Discussed with patient/Family    Thanks for allowing me to participate in your patient's care and please call me with any questions or concerns.    Loran Senters MD  Infectious Disease  [facility] Physician  Phone: [phone]   Fax : [phone]

## 2015-06-30 NOTE — Progress Notes (Signed)
Hospitalist Progress Note      PCP: Referring Not In System (Inactive)    Date of Admission: 06/25/2015    Chief Complaint: open abd wounds and R foot gangrene    Hospital Course: Admitted due to open abdominal wounds and peritonitis. S/P  Intubation for procedures, successfully extubated 3/17.       Subjective: extubated yesterday. Has increased pain w/ abd wound dressing changes.        Medications:  Reviewed    Infusion Medications   ??? PN-Adult Premix 5/20 - Standard Electrolytes - Central Line     ??? sodium chloride 100 mL/hr at 06/30/15 1408   ??? sodium chloride     ??? PN-Adult Premix 5/20 - Standard Electrolytes - Central Line 50 mL/hr at 06/29/15 1751   ??? dextrose     ??? [START ON 07/03/2015] fat emulsion       Scheduled Medications   ??? [START ON 07/03/2015] influenza virus vaccine  0.5 mL Intramuscular Once   ??? aspirin  81 mg Per NG tube Daily   ??? clopidogrel  75 mg Per NG tube Daily   ??? ipratropium-albuterol  1 ampule Inhalation Q4H WA   ??? insulin lispro  0-6 Units Subcutaneous Q4H   ??? sodium chloride flush  10 mL Intravenous 2 times per day   ??? famotidine (PEPCID) injection  20 mg Intravenous Daily   ??? fluconazole  200 mg Intravenous Q24H   ??? metroNIDAZOLE  500 mg Intravenous Q8H   ??? piperacillin-tazobactam  3.375 g Intravenous Q8H   ??? sodium chloride flush  10 mL Intravenous 2 times per day   ??? vancomycin  500 mg Intravenous Q24H   ??? fentaNYL  1 patch Transdermal Q72H     PRN Meds: acetaminophen, ipratropium-albuterol, magnesium hydroxide, glucose, dextrose, glucagon (rDNA), dextrose, potassium chloride, potassium chloride, magnesium sulfate, acetaminophen, ondansetron, HYDROmorphone **OR** HYDROmorphone      Intake/Output Summary (Last 24 hours) at 06/30/15 1515  Last data filed at 06/30/15 1410   Gross per 24 hour   Intake          1886.47 ml   Output              630 ml   Net          1256.47 ml       Exam:    Visit Vitals   ??? BP 116/83   ??? Pulse 113   ??? Temp 97.8 ??F (36.6 ??C) (Temporal)   ??? Resp 20   ???  Ht 5\' 2"  (1.575 m)   ??? Wt 102 lb 1.2 oz (46.3 kg)   ??? SpO2 100%   ??? BMI 18.67 kg/m2       General appearance: No apparent distress, appears stated age and cooperative.  HEENT: Pupils equal, round, and reactive to light. Conjunctivae/corneas clear.  Neck: Supple, with full range of motion. No jugular venous distention. Trachea midline.  Respiratory:  Normal respiratory effort. Clear to auscultation, bilaterally without Rales/Wheezes/Rhonchi.  Cardiovascular: Regular rate and rhythm with normal S1/S2 without murmurs, rubs or gallops.  Abdomen: multiple open wounds and drains - 2 urostomies, R colostomy, 2 drains  Musculoskeletal: R foot - distal great toe and toes 2-5 black discoloration as well as anterior forefoot. DP now palpable on R foot. Full range of motion.  Skin: Skin color, texture, turgor normal.  No rashes or lesions.  Neurologic:  Neurovascularly intact without any focal sensory/motor deficits. Cranial nerves: II-XII intact, grossly non-focal.  Psychiatric: Alert  and oriented, thought content appropriate, normal insight  Capillary Refill: 3 seconds   Peripheral Pulses: +2 palpable, equal bilaterally       Labs:   Recent Labs      06/28/15   0450  06/29/15   0615  06/30/15   0530   WBC  28.3*  21.4*  11.6*   HGB  9.2*  8.1*  7.5*   HCT  29.6*  26.2*  24.3*   PLT  77*  88*  77*     Recent Labs      06/28/15   0450   06/29/15   0615   06/30/15   0000  06/30/15   0530  06/30/15   0830   NA  140   < >  144   145   < >  146*  147*  145   K  3.6   < >  4.1   4.2   < >  3.9  3.7  3.9   CL  110   < >  118*   117*   < >  120*  120*  119*   CO2  15*   < >  14*   14*   < >  15*  15*  15*   BUN  35*   < >  32*   33*   < >  30*  30*  30*   CREATININE  1.1   < >  1.1   1.1   < >  1.0  0.9  0.9   CALCIUM  6.8*   < >  6.9*   6.9*   < >  7.3*  7.4*  7.5*   PHOS  3.2   --   2.7   --    --    --    --     < > = values in this interval not displayed.     Recent Labs      06/29/15   1620  06/30/15   0000  06/30/15   0830   AST   49*  48*  48*   ALT  44*  49*  50*   BILITOT  0.4  0.4  0.4   ALKPHOS  89  87  107     No results for input(s): INR in the last 72 hours.  No results for input(s): CKTOTAL, TROPONINI in the last 72 hours.    Urinalysis:    No results found for: NITRU, WBCUA, BACTERIA, RBCUA, BLOODU, [place], [place]    Radiology:  CTA ABDOMINAL AORTA W BILAT RUNOFF [place]   Final Result   Vascular-      Patent abdominal aorta.      The iliac and lower extremity arteries are overall small in caliber.  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  The left external iliac is patent,   small in caliber.      Femoral popliteal segments are patent bilaterally.      The right trifurcation vessels are not visualized below the mid tibia,   perhaps due to dilution.  On the left, there is 3 vessel runoff, although   plantar arch is not visualized.      Nonvascular-      Gastrointestinal.  Low colonic resection has been performed, with apparent   rectal stump, suggesting abdominal peritoneal resection.  There is a right   lower quadrant colostomy.  Suture over the lower descending colon is  indeterminate for stump versus anastomosis.      Peritoneal/retroperitoneal.  Intraperitoneal fluid is most collected in the   dependent pelvis.  Pelvic percutaneous drain remains in position in the right   lower pelvis.  It would be helpful to position the patient right decubitus   for additional drainage.      Abdominal wall.  Open wounds in the midline and left lower pelvis with likely   fistulous tracts.      Urinary.  Bilateral percutaneous nephrostomy tubes and left ureteral stent   are normal positions.  Right ureteral occlusion device is present.  There is   no hydronephrosis.         IR ABSCESS DRAIN RETROPERITONEAL PERCUTANEOUS   Final Result   Successful placement of 14 French percutaneous drain to the left lower   quadrant/pelvis.         IR Fluoroscopy Guided Central Venous Access Device Placement   Final Result   Successful placement  of right jugular triple-lumen venous catheter, by   sonographic and fluoroscopic guidance.         XR Chest Portable   Final Result   Satisfactory position endotracheal tube.      No acute cardiac or pulmonary disease.         CT ABDOMEN PELVIS WO IV CONTRAST Additional Contrast? None   Final Result   1. Moderate amount of complex intraperitoneal and extraperitoneal fluid with   multiple locules of air concerning for an infectious process including   possible anastomotic leak in the absence of recent surgery, limited by the   lack of intravenous and enteric contrast.         CT Head WO Contrast   Final Result   No acute intracranial abnormality.         XR Chest Portable   Final Result   The lungs are hyperinflated, but there is no acute finding in the chest.                 Assessment/Plan:    Active Hospital Problems    Diagnosis Date Noted   ??? Abscess [L02.91]    ??? Dehydration [E86.0]    ??? 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 Gangrene of foot ([place]) [I96]    ??? Postprocedural intra-abdominal sepsis ([place]) [T81.4XXA, A41.9]    ??? 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 Cachexia ([place]) [R64]    ??? AKI (acute kidney injury) ([place]) [N17.9]    ??? Hyperkalemia [E87.5]    ??? Open wnd ant abdomen-comp [S31.109A]    ??? Peritonitis and retroperitoneal infections ([place]) [K65.9]    ??? 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 Severe sepsis 2/2 peritonitis - has multiple open abdominal wounds - continue conservative management w/ IV antibiotics - zosyn/flagyl, bowel rest, TPN, wound care. General Surg consulting. Possible repeat CT abd/pelvis on Monday.   ID consulted. Leretha Dykes was leaking a little unclear if dome is able to be replaced.   White count improving     2. RLE ischemia/ gangrene - R foot - distal great toe and toes 2-5 black discoloration as well as anterior forefoot. S/P thrombectomy per Vasc Surg on 3/16. DP now palpable on R foot. On zosyn/flagyl/vanc.  Vascular following     3. AKI - POA - resolved.    4. Acute respiratory failure - resolved, extubated 3/17. Pt  was electively intubated for procedures strictly   Pulmonologist following.??    5. 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  DVT Prophylaxis: heparin gtt  Diet: Diet NPO Effective Now  PN-Adult Premix 5/20 - Standard Electrolytes - Central Line  PN-Adult Premix 5/20 - Standard Electrolytes - Central Line  Code Status: Full Code    Vicente Masson, MD

## 2015-07-01 ENCOUNTER — Encounter: Primary: Internal Medicine

## 2015-07-01 LAB — COMPREHENSIVE METABOLIC PANEL
ALT: 42 U/L — ABNORMAL HIGH (ref 10–40)
ALT: 38 U/L (ref 10–40)
AST: 27 U/L (ref 15–37)
AST: 25 U/L (ref 15–37)
Albumin/Globulin Ratio: 0.5 — ABNORMAL LOW (ref 1.1–2.2)
Albumin/Globulin Ratio: 0.4 — ABNORMAL LOW (ref 1.1–2.2)
Albumin: 1.1 g/dL — ABNORMAL LOW (ref 3.4–5.0)
Albumin: 1 g/dL — ABNORMAL LOW (ref 3.4–5.0)
Alkaline Phosphatase: 70 U/L (ref 40–129)
Alkaline Phosphatase: 70 U/L (ref 40–129)
Anion Gap: 12 (ref 3–16)
Anion Gap: 10 (ref 3–16)
BUN: 28 mg/dL — ABNORMAL HIGH (ref 7–20)
BUN: 27 mg/dL — ABNORMAL HIGH (ref 7–20)
CO2: 14 mmol/L — CL (ref 21–32)
CO2: 16 mmol/L — ABNORMAL LOW (ref 21–32)
Calcium: 7.1 mg/dL — ABNORMAL LOW (ref 8.3–10.6)
Calcium: 7.2 mg/dL — ABNORMAL LOW (ref 8.3–10.6)
Chloride: 116 mmol/L — ABNORMAL HIGH (ref 99–110)
Chloride: 114 mmol/L — ABNORMAL HIGH (ref 99–110)
Creatinine: 0.7 mg/dL (ref 0.6–1.1)
Creatinine: 0.7 mg/dL (ref 0.6–1.1)
GFR African American: >60 (ref >60)
GFR African American: >60 (ref >60)
GFR Non-African American: >60 (ref >60)
GFR Non-African American: >60 (ref >60)
Globulin: 2.4 g/dL
Globulin: 2.4 g/dL
Glucose: 192 mg/dL — ABNORMAL HIGH (ref 70–99)
Glucose: 156 mg/dL — ABNORMAL HIGH (ref 70–99)
Potassium: 4.3 mmol/L (ref 3.5–5.1)
Potassium: 3.7 mmol/L (ref 3.5–5.1)
Sodium: 142 mmol/L (ref 136–145)
Sodium: 140 mmol/L (ref 136–145)
Total Bilirubin: 0.3 mg/dL (ref 0.0–1.0)
Total Bilirubin: 0.3 mg/dL (ref 0.0–1.0)
Total Protein: 3.5 g/dL — ABNORMAL LOW (ref 6.4–8.2)
Total Protein: 3.4 g/dL — ABNORMAL LOW (ref 6.4–8.2)

## 2015-07-01 LAB — BASIC METABOLIC PANEL
Anion Gap: 12 (ref 3–16)
BUN: 28 mg/dL — ABNORMAL HIGH (ref 7–20)
CO2: 15 mmol/L — ABNORMAL LOW (ref 21–32)
Calcium: 7.1 mg/dL — ABNORMAL LOW (ref 8.3–10.6)
Chloride: 114 mmol/L — ABNORMAL HIGH (ref 99–110)
Creatinine: 0.7 mg/dL (ref 0.6–1.1)
GFR African American: >60 (ref >60)
GFR Non-African American: >60 (ref >60)
Glucose: 200 mg/dL — ABNORMAL HIGH (ref 70–99)
Potassium: 3.8 mmol/L (ref 3.5–5.1)
Sodium: 141 mmol/L (ref 136–145)

## 2015-07-01 LAB — POCT GLUCOSE
POC Glucose: 173 mg/dl — ABNORMAL HIGH (ref 70–99)
POC Glucose: 198 mg/dl — ABNORMAL HIGH (ref 70–99)
POC Glucose: 208 mg/dl — ABNORMAL HIGH (ref 70–99)
POC Glucose: 174 mg/dl — ABNORMAL HIGH (ref 70–99)
POC Glucose: 157 mg/dl — ABNORMAL HIGH (ref 70–99)
POC Glucose: 154 mg/dl — ABNORMAL HIGH (ref 70–99)

## 2015-07-01 LAB — CBC WITH AUTO DIFFERENTIAL
Basophils %: 0.1 %
Basophils Absolute: 0 10*3/uL (ref 0.0–0.2)
Eosinophils %: 0.1 %
Eosinophils Absolute: 0 10*3/uL (ref 0.0–0.6)
Hematocrit: 19.3 % — CL (ref 36.0–48.0)
Hemoglobin: 6.1 g/dL — CL (ref 12.0–16.0)
Lymphocytes %: 21.9 %
Lymphocytes Absolute: 1.6 10*3/uL (ref 1.0–5.1)
MCH: 28.3 pg (ref 26.0–34.0)
MCHC: 31.7 g/dL (ref 31.0–36.0)
MCV: 89.3 fL (ref 80.0–100.0)
MPV: 8.4 fL (ref 5.0–10.5)
Monocytes %: 4.8 %
Monocytes Absolute: 0.4 10*3/uL (ref 0.0–1.3)
Neutrophils %: 73.1 %
Neutrophils Absolute: 5.4 10*3/uL (ref 1.7–7.7)
Platelets: 57 10*3/uL — ABNORMAL LOW (ref 135–450)
RBC: 2.16 M/uL — ABNORMAL LOW (ref 4.00–5.20)
RDW: 22.1 % — ABNORMAL HIGH (ref 12.4–15.4)
WBC: 7.4 10*3/uL (ref 4.0–11.0)

## 2015-07-01 LAB — ANTIBODY SCREEN: Antibody Screen: NEGATIVE

## 2015-07-01 LAB — TYPE AND SCREEN: ABO/Rh: B POS

## 2015-07-01 LAB — PREPARE RBC (CROSSMATCH)
Dispense Status Blood Bank: TRANSFUSED
Dispense Status Blood Bank: TRANSFUSED

## 2015-07-01 LAB — HEMOGLOBIN AND HEMATOCRIT
Hematocrit: 32.6 % — ABNORMAL LOW (ref 36.0–48.0)
Hemoglobin: 10.7 g/dL — ABNORMAL LOW (ref 12.0–16.0)

## 2015-07-01 LAB — EKG 12-LEAD
Atrial Rate: 136 {beats}/min
P Axis: 61 degrees
P-R Interval: 94 ms
Q-T Interval: 320 ms
QRS Duration: 68 ms
QTc Calculation (Bazett): 481 ms
R Axis: 74 degrees
T Axis: -85 degrees
Ventricular Rate: 136 {beats}/min

## 2015-07-01 LAB — VANCOMYCIN LEVEL, TROUGH: Vancomycin Tr: 15.8 ug/mL (ref 10.0–20.0)

## 2015-07-01 LAB — CALCIUM, IONIZED
Calcium, Ionized: 1.14 mmol/L (ref 1.12–1.32)
pH, Ven: 7.34 — ABNORMAL LOW (ref 7.35–7.45)

## 2015-07-01 LAB — PHOSPHORUS: Phosphorus: 2.3 mg/dL — ABNORMAL LOW (ref 2.5–4.9)

## 2015-07-01 LAB — MAGNESIUM: Magnesium: 1.7 mg/dL — ABNORMAL LOW (ref 1.80–2.40)

## 2015-07-01 MED ORDER — IPRATROPIUM-ALBUTEROL 0.5-2.5 (3) MG/3ML IN SOLN
Freq: Three times a day (TID) | RESPIRATORY_TRACT | Status: DC
Start: 2015-07-01 — End: 2015-07-04
  Administered 2015-07-02 – 2015-07-04 (×5): 1 via RESPIRATORY_TRACT

## 2015-07-01 MED ORDER — MAGNESIUM SULFATE 2000 MG/50 ML IVPB PREMIX
2 GM/50ML | Freq: Once | INTRAVENOUS | Status: AC
Start: 2015-07-01 — End: 2015-07-01
  Administered 2015-07-01: 17:00:00 2 g via INTRAVENOUS

## 2015-07-01 MED ORDER — DEXTROSE 5 % IV SOLN
5 % | Freq: Once | INTRAVENOUS | Status: AC
Start: 2015-07-01 — End: 2015-07-01
  Administered 2015-07-01: 17:00:00 10 mmol via INTRAVENOUS

## 2015-07-01 MED ORDER — INFUVITE IV INJ
5 % | INTRAVENOUS | Status: AC
Start: 2015-07-01 — End: 2015-07-02
  Administered 2015-07-02: 01:00:00 via INTRAVENOUS

## 2015-07-01 MED ORDER — FAT EMULSION 20 % IV EMUL
20 % | INTRAVENOUS | Status: DC
Start: 2015-07-01 — End: 2015-07-04
  Administered 2015-07-02: 01:00:00 250 mL via INTRAVENOUS

## 2015-07-01 MED ORDER — SODIUM CHLORIDE 0.9 % IV BOLUS
0.9 % | Freq: Once | INTRAVENOUS | Status: AC
Start: 2015-07-01 — End: 2015-07-02
  Administered 2015-07-01: 13:00:00 250 mL via INTRAVENOUS

## 2015-07-01 MED ORDER — IPRATROPIUM-ALBUTEROL 0.5-2.5 (3) MG/3ML IN SOLN
RESPIRATORY_TRACT | Status: DC | PRN
Start: 2015-07-01 — End: 2015-07-04

## 2015-07-01 MED FILL — IPRATROPIUM-ALBUTEROL 0.5-2.5 (3) MG/3ML IN SOLN: RESPIRATORY_TRACT | Qty: 3

## 2015-07-01 MED FILL — METRONIDAZOLE IN NACL 5-0.79 MG/ML-% IV SOLN: 5 mg/mL | INTRAVENOUS | Qty: 100

## 2015-07-01 MED FILL — NORMAL SALINE FLUSH 0.9 % IV SOLN: 0.9 % | INTRAVENOUS | Qty: 10

## 2015-07-01 MED FILL — HYDROMORPHONE HCL 1 MG/ML IJ SOLN: 1 MG/ML | INTRAMUSCULAR | Qty: 1

## 2015-07-01 MED FILL — PLAVIX 75 MG PO TABS: 75 MG | ORAL | Qty: 1

## 2015-07-01 MED FILL — INTRALIPID 20 % IV EMUL: 20 % | INTRAVENOUS | Qty: 250

## 2015-07-01 MED FILL — FAMOTIDINE 20 MG/2ML IV SOLN: 20 MG/2ML | INTRAVENOUS | Qty: 2

## 2015-07-01 MED FILL — ZOSYN 3-0.375 GM/50ML IV SOLN: INTRAVENOUS | Qty: 50

## 2015-07-01 MED FILL — SODIUM CHLORIDE 0.45 % IV SOLN: 0.45 % | INTRAVENOUS | Qty: 1000

## 2015-07-01 MED FILL — SODIUM CHLORIDE 0.9 % IV SOLN: 0.9 % | INTRAVENOUS | Qty: 250

## 2015-07-01 MED FILL — FLUCONAZOLE IN SODIUM CHLORIDE 200-0.9 MG/100ML-% IV SOLN: INTRAVENOUS | Qty: 100

## 2015-07-01 MED FILL — NORMAL SALINE FLUSH 0.9 % IV SOLN: 0.9 % | INTRAVENOUS | Qty: 60

## 2015-07-01 MED FILL — CLINIMIX E/DEXTROSE (5/20) 5 % IV SOLN: 5 % | INTRAVENOUS | Qty: 2000

## 2015-07-01 MED FILL — ASPIRIN 81 MG PO CHEW: 81 MG | ORAL | Qty: 1

## 2015-07-01 MED FILL — VANCOMYCIN HCL IN DEXTROSE 500-5 MG/100ML-% IV SOLN: 500-5 MG/100ML-% | INTRAVENOUS | Qty: 100

## 2015-07-01 MED FILL — MAGNESIUM SULFATE 2 GM/50ML IV SOLN: 2 GM/50ML | INTRAVENOUS | Qty: 50

## 2015-07-01 MED FILL — NORMAL SALINE FLUSH 0.9 % IV SOLN: 0.9 % | INTRAVENOUS | Qty: 20

## 2015-07-01 MED FILL — NORMAL SALINE FLUSH 0.9 % IV SOLN: 0.9 % | INTRAVENOUS | Qty: 50

## 2015-07-01 MED FILL — NORMAL SALINE FLUSH 0.9 % IV SOLN: 0.9 % | INTRAVENOUS | Qty: 40

## 2015-07-01 MED FILL — POTASSIUM PHOSPHATES 45 MMOLE/15ML IV SOLN: 45 MMOLE/15ML | INTRAVENOUS | Qty: 3.33

## 2015-07-01 MED FILL — FENTANYL 25 MCG/HR TD PT72: 25 MCG/HR | TRANSDERMAL | Qty: 1

## 2015-07-01 NOTE — Progress Notes (Signed)
Infectious Disease Follow up Notes    CC :  peritonitis     Antibiotics:   Fluconazole 200 IV q24  Flagyl 500 IV q8   Zosyn 3.375 q8  vanc 500 IV q24     Admit Date:   06/25/2015  Hospital Day: 7    Subjective:     Stable from respiratory standpoint  No fever  Reports pain in abdomen  Seems anxious     Objective:     Patient Vitals for the past 8 hrs:   BP Temp Pulse Resp SpO2 Weight   07/01/15 0852 113/80 97.3 ??F (36.3 ??C) 96 13 100 % -   07/01/15 0837 113/78 97.7 ??F (36.5 ??C) 96 15 - -   07/01/15 0810 - - - - 100 % -   07/01/15 0809 - - - 16 100 % -   07/01/15 0630 106/78 - 92 21 100 % 97 lb 14.2 oz (44.4 kg)   07/01/15 0600 106/78 - 82 12 100 % -   07/01/15 0530 106/82 - 86 17 100 % -   07/01/15 0500 114/81 - 95 19 100 % -   07/01/15 0430 112/80 - 88 16 100 % -   07/01/15 0400 116/82 - 96 15 100 % -   07/01/15 0330 114/83 - 98 18 100 % -   07/01/15 0300 112/83 - 102 20 100 % -   07/01/15 0230 108/82 - 102 15 100 % -   07/01/15 0200 111/83 - 92 14 100 % -       EXAM:  General:  Thin.  Alert, anxious appearing.  Cooperative with exam     HEENT:  Sclera white, no conjunctival lesions.  No thrush, MMM    NECK:  supple   LUNGS:  CTA upper lobes   CV:  RRR  ABD: Tender, hypoactive bowel sounds.   Drain, LLQ, wound manager with wounds packed  Nephrostomy bil     EXT:  Ischemic 5 toes right foot, feet warm      SKIN: No rash      LINE: R IJ CVL site ok       Scheduled Meds:  ??? sodium chloride  250 mL Intravenous Once   ??? [START ON 07/03/2015] influenza virus vaccine  0.5 mL Intramuscular Once   ??? aspirin  81 mg Per NG tube Daily   ??? clopidogrel  75 mg Per NG tube Daily   ??? ipratropium-albuterol  1 ampule Inhalation Q4H WA   ??? insulin lispro  0-6 Units Subcutaneous Q4H   ??? sodium chloride flush  10 mL Intravenous 2 times per day   ??? famotidine (PEPCID) injection  20 mg Intravenous Daily   ??? fluconazole  200 mg Intravenous Q24H   ??? metroNIDAZOLE   500 mg Intravenous Q8H   ??? piperacillin-tazobactam  3.375 g Intravenous Q8H   ??? sodium chloride flush  10 mL Intravenous 2 times per day   ??? vancomycin  500 mg Intravenous Q24H   ??? fentaNYL  1 patch Transdermal Q72H       Continuous Infusions:  ??? PN-Adult Premix 5/20 - Standard Electrolytes - Central Line 50 mL/hr at 06/30/15 1927   ??? sodium chloride 100 mL/hr at 07/01/15 0240   ??? dextrose     ??? [START ON 07/03/2015] fat emulsion            Data Review:    Lab Results   Component Value Date    WBC 7.4 07/01/2015  HGB 6.1 (LL) 07/01/2015    HCT 19.3 (LL) 07/01/2015    MCV 89.3 07/01/2015    PLT 57 (L) 07/01/2015     Lab Results   Component Value Date    CREATININE 0.7 07/01/2015    BUN 27 (H) 07/01/2015    NA 140 07/01/2015    K 3.7 07/01/2015    CL 114 (H) 07/01/2015    CO2 16 (L) 07/01/2015       Hepatic Function Panel: Lab Results   Component Value Date    ALKPHOS 70 07/01/2015    ALT 38 07/01/2015    AST 25 07/01/2015    PROT 3.4 07/01/2015    BILITOT 0.3 07/01/2015    LABALBU 1.0 07/01/2015         MICRO:  3/14 BC neg  3/15 Abscess fluid cx Bacteroides, Enterococcus faecalis/faecium amp-R      IMAGING:  Reviewed         Assessment:     Patient Active Problem List    Diagnosis Date Noted   ??? Abscess    ??? Dehydration    ??? Encephalopathies    ??? Gangrene of foot ([place])    ??? Postprocedural intra-abdominal sepsis ([place])    ??? Ischemic foot    ??? Cachexia ([place])    ??? AKI (acute kidney injury) ([place])    ??? Hyperkalemia    ??? Open wnd ant abdomen-comp    ??? Peritonitis and retroperitoneal infections ([place])    ??? Fistula        Hx colon cancer s/p resection, XRT    Feculent peritonitis  S/p IR drain placement  Mixed flora isolated     ECF x2    Ischemic feet, s/p angio, thrombectomy, PTA    Cachexia     Thrombocytopenia       Plan:     Continue Zosyn, vanc, fluconazole  DC flagyl     Repeat CT     Will need long term IV abx course      Discussed with patient/family, all questions answered        Barry Brunner, MD  Phone:  [phone]   Fax : [phone]

## 2015-07-01 NOTE — Progress Notes (Signed)
Shift assessment completed. See doc flowsheets. Pt VSS, afebrile, lung sounds clear bilaterally, O2 sats 95-100% RA, HR NSR, rate 80-90's, no irregular heart sounds heard. Pt w/x2 abd incisions, covered w/coloplast, incision sites pink and intact, draining liquid brown/green secretions, dressing leaking at this time. Colostomy noted on RLQ, stoma pink and intact, draining brown liquid stool. Bilateral nephrostomy noted, dressings CDI. Pt c/o constant pain, dilaudid to be given PRN per MAR. POC provided for shift. Will continue to monitor.

## 2015-07-01 NOTE — Plan of Care (Signed)
Problem: Nutrition  Goal: Optimal nutrition therapy  Outcome: Ongoing  Nutrition Problem: Inadequate oral intake  Intervention: Food and/or Nutrient Delivery: Continue NPO, Modify current Parenteral Nutrition  Nutritional Goals: Pt will tolerate TPN at goal rate

## 2015-07-01 NOTE — Progress Notes (Signed)
Nutrition Assessment    Type and Reason for Visit: Reassess    Nutrition Recommendations:   1. When lytes are stable, recommend to increase TPN to new goal rate 60 mL per hour.  2. Recommend Clinimix 5/20 at 60 mL per hour to provide 1440 mL total volume, 1267 calories, 72 grams protein, dextrose load 4.55 mg/kg/min.  3. Physician/LIP to monitor closely and correct lytes (Phos,Mg,K+) d/t risk of refeeding syndrome  4. Recommend 250 mL 20% lipids two times per week  5. Recommend FSBS, monitor glucose, need for insulin  6. Pharmacy to adjust MVI and Trace Elements as needed     Malnutrition Assessment:  ?? Malnutrition Status: Meets the criteria for severe malnutrition  ?? Context: Chronic illness  ?? Findings of the 6 clinical characteristics of malnutrition (Minimum of 2 out of 6 clinical characteristics is required to make the diagnosis of moderate or severe Protein Calorie Malnutrition based on AND/ASPEN Guidelines):  1. Energy Intake-Not available,      2. Weight Loss-10% loss or greater, in 1 month  3. Fat Loss-Moderate subcutaneous fat loss, Triceps  4. Muscle Loss-Moderate muscle mass loss, Clavicles (pectoralis and deltoids), Thigh (quadriceps)  5. Fluid Accumulation-Mild fluid accumulation, Extremities  6. Grip Strength-Not measured    Nutrition Diagnosis:   ?? Problem: Inadequate oral intake  ?? Etiology: related to Insufficient energy/nutrient consumption    ??? Signs and symptoms:  as evidenced by NPO status due to medical condition    Nutrition Assessment:  ?? Subjective Assessment: Pt tolerated extubation 3/18. TPN at initial goal rate 50 mL per hour. Note Mg+ and Phosphorus are low today. Surgery continues to recommend bowel rest and TPN.    ?? Nutrition-Focused Physical Findings: Refer to malnutrition assessment   ?? Wound Type: Pressure Ulcer, Stage II, Unstageable, Multiple, Open Wounds  ?? Current Nutrition Therapies:  ?? Oral Diet Orders: NPO   ?? Oral Diet intake: NPO  ?? Oral Nutrition Supplement (ONS)  Orders: None  ?? ONS intake: NPO  ?? Parenteral Nutrition Orders:  ?? Type and Formula: Premix Central   ?? Lipids: None  ?? Rate/Volume: 50 mL per hour/ 1200 mL   ?? Duration: Continuous 24 hrs  ?? Current PN Order Provides: Clinimix 5/20 at 50 mL per hour to provide 1200 mL total volume, 1056 calories, 60 grams protein, dextrose load 3.79 mg/kg/min  ?? Goal PN Orders Provides: Clinimix 5/20 at increased goal rate 60 mL per hour to provide 1440 mL total volume, 1267 calories, 72 grams protein, dextrose load 4.55 mg/kg/min  ?? Anthropometric Measures:  ?? Ht: 5\' 2"  (157.5 cm)   ?? Current Body Wt: 97 lb (44 kg)  ?? Admission Body Wt: 81 lb (36.7 kg)  ?? Ideal Body Wt: 110 lb (49.9 kg)   ?? BMI Classification: BMI <18.5 Underweight  ?? Comparative Standards (Estimated Nutrition Needs):  ?? Estimated Daily Total Kcal: 1100-1320   ?? Estimated Daily Protein (g): 53-88 grams   ?? Estimated Daily Total Fluid (ml/day): 1320-1540 mL     Estimated Intake vs Estimated Needs: Intake Meets Needs (Via TPN)    Nutrition Risk Level: High    Nutrition Interventions:   Continue NPO, Modify current Parenteral Nutrition  Continued Inpatient Monitoring, Education Not Indicated    Nutrition Evaluation:   ?? Evaluation: Goal achieved   ?? Goals: Pt will tolerate TPN at goal rate    ?? Monitoring: NPO Status, PN Tolerance, Comparative Standards, Weight, Mental Status/Confusion, Fluid Balance, Wound Healing, Skin Integrity, Pertinent  Labs    See Adult Nutrition Doc Flowsheet for more detail.     Electronically signed by Greer Ee, RD, LD on 07/01/15 at 9:09 AM    Contact Number: [phone]

## 2015-07-01 NOTE — Progress Notes (Signed)
RESPIRATORY THERAPY ASSESSMENT    Erica Harding   [id number]  1970/05/17      Recent Surgical Procedures:  Abdominal surgery  Pulmonary History:  none  Home Respiratory Therapy:  none  Home Oxygen Therapy:  none  Current Respiratory Therapy:  Duoneb Q4 while awake  Vitals:    07/01/15 1230   BP: 111/76   Pulse: 97   Resp: 18   Temp: 97.5 ??F (36.4 ??C)   SpO2: 100%       MPVC: UTA mL  Actual VC: UTA mL  Sputum: Sputum Color: Unable to assess, Tenacity: Unable to assess, Sputum How Obtained: Cough on request    ASSESSMENT OF RESPIRATORY SEVERITY INDEX (RSI)    Patients with orders for inhalation medications, oxygen, or any therapeutic treatment modality will be placed on Respiratory Protocol.  They will be assessed with the first treatment and at least every 72 hours thereafter.  The following severity scale will be used to determine frequency of treatment intervention.    Respiratory History: No Smoking History = 0    Recent Surgical History: Lower Abdominal = 2    Level of Consciousness: Alert, Oriented, and Cooperative = 0    Level of Activity: Mostly sedentary, minimal walking = 2    Respiratory Pattern: Regular Pattern; RR 8-20 = 0    Breath Sounds: Diminshed bilaterally and/or crackles = 2    Cough: Weak, non-productive = 3    SPO2 (COPD values may differ): Greater than or equal to 92% on room air = 0    Peak Flow (asthma only): not applicable = 0    RSI: 1-91 = TID (three times daily) and Q4hr PRN for dyspnea  PLAN    Goals: improve oxygenation    Patient educated on: Oxygenation     Comment / Outcomes: To improve ventilation and oxygenation    Plan of Care: Duoneb TID and Q4 PRN    Is patient being placed on Home Treatment Regimen? No      Respiratory Protocol Guidelines    1. Assessment and treatment by Respiratory Therapy will be initiated for medication and therapeutic interventions upon initiation of aerosolized medication.  2. Physician will be contacted for respiratory rate (RR) greater than 35 breaths  per minute. Therapy will be held for heart rate (HR) greater than 140 beats per minute, pending direction from physician.  3. Bronchodilators will be administered via Metered Dose Inhaler (MDI) or Hydrofluoroalkane (HFA) with spacer when the following criteria are met:  a. Alert and cooperative     b. HR < 140 bpm  c. RR < 30 bpm                d. Can demonstrate a 2???3 second inspiratory hold  4. Bronchodilators will be administered via Hand Held Nebulizer [facility]) to patients when ANY of the following criteria are met  a. Incogizant or uncooperative          b. Patients treated with HHN at Home        c. Unable to demonstrate proper MDI or HFA technique     d. RR > 30 bpm   5. Bronchodilators will be delivered via Metered Dose Inhaler (MDI) or Hydrofluoroalkane (HFA) with spacer to intubated patients on mechanical ventilation.  6. Inhalation medication orders will be delivered and/or substituted as outlined below.    Aerosolized Medications Ordering and Administration Guidelines:    1. All Medications will be ordered by a physician, and their frequency and/or modality  will be adjusted as defined by the patients Respiratory Severity Index (RSI) score.  2. If the patient does not have documented COPD, consider discontinuing anticholinergics when RSI is less than 9.  3. If the bronchospasm worsens (increased RSI), then the bronchodilator frequency can be increased to a maximum of every 4 hours.  If greater than every 4 hours is required, the physician will be contacted.  4. If the bronchospasm improves, the frequency of the bronchodilator can be decreased, based on the patient's RSI, but not less than home treatment regimen frequency.  5. Bronchodilator(s) will be discontinued if patient has a RSI less than 9 and has received no scheduled or as needed treatment for 72  Hrs.    Patients Ordered on a Mucolytic Agent:    1. Must always be administered with a bronchodilator.    2. Discontinue if patient experiences worsened  bronchospasm, or secretions have lessened to the point that the patient is able to clear them with a cough.    Anti-inflammatory and Combination Medications:    1. If the patient lacks prior history of lung disease, is not using inhaled anti-inflammatory medication at home, and lacks wheezing by examination or by history for at least 24 hours, contact physician for possible discontinuation.      Pharmacy formulary interchanges for Respiratory medications can also be found by using the Smart Phrase .[email or web address] in any EPIC comment field.

## 2015-07-01 NOTE — Progress Notes (Signed)
[facility]  ICU F/U Note    Subjective:   CC / HPI: sepsis, intraabdominal abscess  Patient is awake on RA this am    PMH: Reviewed, no changes    PSH: Reviewed, no changes    Review of Systems    Constitutional: Negative for fever, chills, diaphoresis.  Respiratory: See HPI  Cardiovascular: Negative for chest pain, palpitations and leg swelling.   Gastrointestinal: Negative for abdominal pain, blood in stool, abdominal distention and anal bleeding.   Genitourinary: Negative for dysuria, urgency, frequency, hematuria, decreased urine volume, and enuresis.   Skin: Negative.  Negative for color change and pallor.   Neurological: No dizziness, lightheaded, loss of consciousness, HA or neck stiffness      Objective:   Data Review:  Vital Signs:   Visit Vitals   ??? BP 113/80   ??? Pulse 96   ??? Temp 97.3 ??F (36.3 ??C)   ??? Resp 13   ??? Ht 5\' 2"  (1.575 m)   ??? Wt 97 lb 14.2 oz (44.4 kg)   ??? SpO2 100%   ??? BMI 17.9 kg/m2       24 Hour I&O Review:     Intake/Output Summary (Last 24 hours) at 07/01/15 1115  Last data filed at 07/01/15 0600   Gross per 24 hour   Intake          3210.17 ml   Output             1480 ml   Net          1730.17 ml     Continuous Infusions:  ??? PN-Adult Premix 5/20 - Standard Electrolytes - Central Line 50 mL/hr at 06/30/15 1927   ??? sodium chloride 100 mL/hr at 07/01/15 0240   ??? dextrose     ??? [START ON 07/03/2015] fat emulsion       Current Medications: Current Facility-Administered Medications: 0.9 % sodium chloride bolus, 250 mL, Intravenous, Once  [START ON 07/03/2015] influenza quadrivalent split vaccine (FLUZONE;FLUARIX) injection 0.5 mL, 0.5 mL, Intramuscular, Once  PN-Adult Premix 5/20 - Standard Electrolytes - Central Line, , Intravenous, Continuous TPN  0.45 % sodium chloride infusion, , Intravenous, Continuous  aspirin chewable tablet 81 mg, 81 mg, Per NG tube, Daily  clopidogrel (PLAVIX) tablet 75 mg, 75 mg, Per NG tube, Daily  acetaminophen (OFIRMEV) infusion 623 mg, 15 mg/kg,  Intravenous, Q6H PRN  ipratropium-albuterol (DUONEB) nebulizer solution 1 ampule, 1 ampule, Inhalation, Q4H WA  ipratropium-albuterol (DUONEB) nebulizer solution 1 ampule, 1 ampule, Inhalation, Q4H PRN  magnesium hydroxide (MILK OF MAGNESIA) 400 MG/5ML suspension 30 mL, 30 mL, Oral, Daily PRN  glucose (GLUTOSE) 40 % oral gel 15 g, 15 g, Oral, PRN  dextrose 50 % solution 12.5 g, 12.5 g, Intravenous, PRN  glucagon (rDNA) injection 1 mg, 1 mg, Intramuscular, PRN  dextrose 5 % solution, 100 mL/hr, Intravenous, PRN  [START ON 07/03/2015] fat emulsion 20 % infusion 250 mL, 250 mL, Intravenous, Once per day on Mon Thu  insulin lispro (HUMALOG) injection pen 0-6 Units, 0-6 Units, Subcutaneous, Q4H  sodium chloride flush 0.9 % injection 10 mL, 10 mL, Intravenous, 2 times per day  potassium chloride 10 mEq/100 mL IVPB (Peripheral Line), 10 mEq, Intravenous, PRN  potassium chloride 20 mEq/50 mL IVPB (Central Line), 20 mEq, Intravenous, PRN  magnesium sulfate 1 g in dextrose 5% 100 mL IVPB premix, 1 g, Intravenous, PRN  acetaminophen (TYLENOL) tablet 650 mg, 650 mg, Oral, Q4H PRN  ondansetron (ZOFRAN) injection 4 mg, 4 mg, Intravenous, Q6H PRN  famotidine (PEPCID) injection 20 mg, 20 mg, Intravenous, Daily  fluconazole (DIFLUCAN) 200 mg in 0.9 % NaCl 100 mL premix IVPB, 200 mg, Intravenous, Q24H  metronidazole (FLAGYL) 500 mg in NaCl 100 mL IVPB premix, 500 mg, Intravenous, Q8H  piperacillin-tazobactam (ZOSYN) 3.375 g in dextrose 50 mL IVPB extended infusion (premix), 3.375 g, Intravenous, Q8H  sodium chloride flush 0.9 % injection 10 mL, 10 mL, Intravenous, 2 times per day  vancomycin (VANCOCIN) 500 mg in dextrose 5% 100 mL IVPB, 500 mg, Intravenous, Q24H  fentaNYL (DURAGESIC) 25 MCG/HR 1 patch, 1 patch, Transdermal, Q72H  HYDROmorphone (DILAUDID) injection 0.5 mg, 0.5 mg, Intravenous, Q2H PRN **OR** HYDROmorphone (DILAUDID) injection 1 mg, 1 mg, Intravenous, Q2H PRN    CBC:  Recent Labs      06/29/15   0615  06/30/15   0530   07/01/15   0445   WBC  21.4*  11.6*  7.4   RBC  2.93*  2.70*  2.16*   HGB  8.1*  7.5*  6.1*   HCT  26.2*  24.3*  19.3*   PLT  88*  77*  57*   MCV  89.5  89.9  89.3   MCH  27.5  27.9  28.3   MCHC  30.7*  31.0  31.7   RDW  21.7*  22.1*  22.1*      BMP:  Recent Labs      07/01/15   0010  07/01/15   0445  07/01/15   0840   NA  142  141  140   K  4.3  3.8  3.7   CL  116*  114*  114*   CO2  14*  15*  16*   BUN  28*  28*  27*   CREATININE  0.7  0.7  0.7   CALCIUM  7.1*  7.1*  7.2*   GLUCOSE  192*  200*  156*        Radiology Review:  Pertinent images / reports were reviewed as a part of this visit.     Physical Exam   Constitutional: Awake  HENT: No oropharyngeal exudate.   Eyes: Pupils are equal, round, and reactive to light.   Neck: No JVD present. No tracheal deviation present.   Cardiovascular: regular rhythm, S1&S2 and intact distal pulses.    Pulmonary/Chest: bilateral breath sounds. No wheezes, rhonchi, rales or tenderness.   Abdominal: previous abdominal wounds noted with exposure of intra abdominal fluids   Musculoskeletal: No edema and no tenderness. ischemic changes of R>L digits with no pulse appreciated on the R foot  Neurological: Non focal.    Assessment:     1. Intra abdominal infection with abscess  2. Sepsis 2/2 above  3. AKI  4. Hx of colorectal ca  5. Ischemic foot, acute   Plan:     VS reviewed as above. Stress ulcer, DVT, and line protocols are being followed if not contraindicated.  She does look better  Pain is better controlled   Continue abx per ID service  Continue TPN   Continue D5W, hyperchloremia is better  Discharge planning to LTAC  OK to transfer out of ICU service, call with change in status      ??  ??

## 2015-07-01 NOTE — Progress Notes (Signed)
Pt refusing CT scan at this time, despite education provided for benefit of viewing abdomen. States she does not want to lay down and will not drink oral contrast. Notified sx at this time. Will continue to monitor.

## 2015-07-01 NOTE — Care Coordination-Inpatient (Signed)
Discharge planning-    Spoke with the patient re: discharge planning needs. Discussed need for LTAC at discharge. Patient is agreeable. Provided with a list of LTAC's, left at the bedside. The patient requests a referral to [facility]. Patient reports that she has a PCP in town, but she does not remember the name. Patient reports that she recently moved to the area from [place], [place]. Patient identifies no other needs at this time.    Called [facility] with the referral- she was already following as an early LTAC referral. Patient will require a pre cert for LTAC. Awaiting clarification re: amputation needs.    Plan- [facility]/ [facility] at discharge. Will require a pre cert.    Will continue to follow for support and discharge planning.    -Gershon Mussel, MSW, LSW

## 2015-07-01 NOTE — Progress Notes (Signed)
2nd unit PRBC transfusion started at this time. Pt tolerating well. Deshay Blumenfeld L Shelsey Rieth RN, BSN, CCRN.

## 2015-07-01 NOTE — Care Coordination-Inpatient (Signed)
[facility] Nurse  Follow-up Progress Note       NAME:  Erica Harding  MEDICAL RECORD NUMBER:  [id number]  AGE:  45 y.o.   GENDER:  female  DOB:  19-Nov-1970  TODAY'S DATE:  07/01/2015    Subjective:   Pt drowsy, easily arouses, extubated over weekend. For repeat CT scan of abd today.  Large fistula manager remains intact, minimal output from fistulas, none from stoma.  Would like to try to  place VAC this week.    Electronically signed by Carollee Herter, CWOCN on 07/01/2015 at 10:09 AM

## 2015-07-01 NOTE — Progress Notes (Signed)
Reassessment completed. See doc flowsheets. Pt VSS, afebrile, dressing changed inside of coloplast at this time w/wet/dry dressing. Pt resting comfortably at this time after administration of pain medication prior to turning and dressing change. No other acute other changes noted. Will continue to monitor.

## 2015-07-01 NOTE — Progress Notes (Signed)
Reassessment completed. See doc flowsheets. Pt VSS, afebrile, abd sx sites remain pink and intact. coloplast remains in place, continuing to leak from site, reinforced w/tape at this time. Pt repositioned and bed changed. Will continue to monitor.

## 2015-07-01 NOTE — Progress Notes (Signed)
Erica Harding is a 45 y.o. female patient.  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 0.9 % sodium chloride bolus  250 mL Intravenous Once Amitkumar Jerilynn Som, MD 20 mL/hr at 07/01/15 0831 250 mL at 07/01/15 0831   ??? [START ON 07/03/2015] influenza quadrivalent split vaccine (FLUZONE;FLUARIX) injection 0.5 mL  0.5 mL Intramuscular Once Maureen Ralphs, MD       ??? PN-Adult Premix 5/20 - Standard Electrolytes - Central Line   Intravenous Continuous TPN Elly Modena, MD 50 mL/hr at 06/30/15 1927     ??? 0.45 % sodium chloride infusion   Intravenous Continuous Vicente Masson, MD 100 mL/hr at 07/01/15 0240     ??? aspirin chewable tablet 81 mg  81 mg Per NG tube Daily Neldon Mc, MD   Stopped at 06/28/15 1659   ??? clopidogrel (PLAVIX) tablet 75 mg  75 mg Per NG tube Daily Neldon Mc, MD   Stopped at 06/28/15 1659   ??? acetaminophen (OFIRMEV) infusion 623 mg  15 mg/kg Intravenous Q6H PRN Maureen Ralphs, MD       ??? ipratropium-albuterol (DUONEB) nebulizer solution 1 ampule  1 ampule Inhalation Q4H WA Elly Modena, MD   1 ampule at 07/01/15 0809   ??? ipratropium-albuterol (DUONEB) nebulizer solution 1 ampule  1 ampule Inhalation Q4H PRN Elly Modena, MD       ??? magnesium hydroxide (MILK OF MAGNESIA) 400 MG/5ML suspension 30 mL  30 mL Oral Daily PRN Domenick Bookbinder II, MD       ??? glucose (GLUTOSE) 40 % oral gel 15 g  15 g Oral PRN Siddharth K Mushrif, MD       ??? dextrose 50 % solution 12.5 g  12.5 g Intravenous PRN Siddharth K Mushrif, MD   12.5 g at 06/26/15 0010   ??? glucagon (rDNA) injection 1 mg  1 mg Intramuscular PRN Siddharth K Mushrif, MD       ??? dextrose 5 % solution  100 mL/hr Intravenous PRN Siddharth K Mushrif, MD       ??? [START ON 07/03/2015] fat emulsion 20 % infusion 250 mL  250 mL  Intravenous Once per day on Mon Thu Elly Modena, MD       ??? insulin lispro (HUMALOG) injection pen 0-6 Units  0-6 Units Subcutaneous Q4H Elly Modena, MD   1 Units at 07/01/15 0847   ??? sodium chloride flush 0.9 % injection 10 mL  10 mL Intravenous 2 times per day Brigitte Pulse, MD   10 mL at 07/01/15 0855   ??? potassium chloride 10 mEq/100 mL IVPB (Peripheral Line)  10 mEq Intravenous PRN Brigitte Pulse, MD       ??? potassium chloride 20 mEq/50 mL IVPB (Central Line)  20 mEq Intravenous PRN Brigitte Pulse, MD 50 mL/hr at 06/30/15 1929 20 mEq at 06/30/15 1929   ??? magnesium sulfate 1 g in dextrose 5% 100 mL IVPB premix  1 g Intravenous PRN Brigitte Pulse, MD       ??? acetaminophen (TYLENOL) tablet 650 mg  650 mg Oral Q4H PRN Brigitte Pulse, MD       ??? ondansetron [facility]) injection 4 mg  4 mg Intravenous Q6H PRN Brigitte Pulse, MD       ??? famotidine (PEPCID) injection 20 mg  20 mg Intravenous Daily Brigitte Pulse, MD   20 mg at 07/01/15 0854   ??? fluconazole (DIFLUCAN) 200 mg in 0.9 % NaCl 100 mL premix IVPB  200 mg Intravenous Q24H Brigitte Pulse, MD 100 mL/hr at 07/01/15 0854 200 mg at 07/01/15 0854   ??? metronidazole (FLAGYL) 500 mg in NaCl 100 mL IVPB premix  500 mg Intravenous Q8H Brigitte Pulse, MD   Stopped at 07/01/15 0600   ??? piperacillin-tazobactam (ZOSYN) 3.375 g in dextrose 50 mL IVPB extended infusion (premix)  3.375 g Intravenous Q8H Brigitte Pulse, MD 12.5 mL/hr at 07/01/15 0854 3.375 g at 07/01/15 0854   ??? sodium chloride flush 0.9 % injection 10 mL  10 mL Intravenous 2 times per day Delorse Limber, APRN   10 mL at 07/01/15 0855   ??? vancomycin (VANCOCIN) 500 mg in dextrose 5% 100 mL IVPB  500 mg Intravenous Q24H Brigitte Pulse, MD   Stopped at 06/30/15 1131   ??? fentaNYL (DURAGESIC) 25 MCG/HR 1 patch  1 patch Transdermal Q72H Brigitte Pulse, MD   1 patch at 06/28/15 2126   ??? HYDROmorphone (DILAUDID) injection 0.5 mg  0.5 mg Intravenous Q2H PRN Brigitte Pulse, MD   0.5 mg at  06/29/15 2024    Or   ??? HYDROmorphone (DILAUDID) injection 1 mg  1 mg Intravenous Q2H PRN Brigitte Pulse, MD   1 mg at 07/01/15 [id number]     Allergies   Allergen Reactions   ??? Latex Itching   ??? Compazine [Prochlorperazine Maleate] Swelling     Active Problems:    AKI (acute kidney injury) ([place])    Hyperkalemia    Open wnd ant abdomen-comp    Peritonitis and retroperitoneal infections (HCC)    Fistula    Ischemic foot    Cachexia (HCC)    Postprocedural intra-abdominal sepsis (HCC)    Abscess    Dehydration    Encephalopathies    Gangrene of foot (HCC)    Blood pressure 113/80, pulse 96, temperature 97.3 ??F (36.3 ??C), resp. rate 13, height 5\' 2"  (1.575 m), weight 97 lb 14.2 oz (44.4 kg), SpO2 100 %.    Subjective patient awake, alert, weak    Objective:  Vital signs: (most recent): Blood pressure 113/80, pulse 96, temperature 97.3 ??F (36.3 ??C), resp. rate 13, height 5\' 2"  (1.575 m), weight 97 lb 14.2 oz (44.4 kg), SpO2 100 %.    Output: Producing urine.    Lungs:  Normal respiratory rate and normal effort.    abdomen soft  Enteric output from fistulas and drain    Assessment & Plan 45 year old female admitted with sepsis, multiple intestinal fistulas and a pelvic abscess. She is S/P percutaneous drainage of the pelvic abscess. Continue bowel rest, TPN and broad spectrum antibiotics. Repeat CT scan today, encourage pt to try oral contrast.      Delorse Limber, APRN  07/01/2015     Slowly improving. Will check CT A/P today to evaluate for undrained fluid collections. If there are no undrained fluid collections, patient is okay for transfer to LTAC from a surgical perspective.

## 2015-07-01 NOTE — Progress Notes (Signed)
Hospitalist ICU Progress Note    CC: <principal problem not specified>    Hospital course:  45yo F with open abd wounds and peritonitis.  Pt previously lived in [place].  Her sister drove here here and admitted her to the hospital.   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  Pt found to have peritonitis and had fecal contents coming out of her abd wound.  She also had an ischemic R foot and had an urgent R EIA/CFA thrombectomy and stent to EIA.    Admit date: 06/25/2015  Days in hospital:  6    24 Hour Events: pt still debilitated with a lot of pain - likely needs LTAC    Subjective: pt c/o a lot of pain in abd - she is on TPN -she will likely lose part of R foot due to gangrene from thrombus - now with stent on plavix    ROS:  A comprehensive review of systems was negative except for: pain with any movement, some pain in R foot .    Objective:    Visit Vitals   ??? BP 113/80   ??? Pulse 96   ??? Temp 97.3 ??F (36.3 ??C)   ??? Resp 13   ??? Ht 5\' 2"  (1.575 m)   ??? Wt 97 lb 14.2 oz (44.4 kg)   ??? SpO2 100%   ??? BMI 17.9 kg/m2       Gen: chronically ill appearing, pale  HEENT: NC/AT, moist mucous membranes, no oropharyngeal erythema or exudate    Neck: supple, trachea midline, no anterior cervical or SC LAD  Heart:  Normal s1/s2, RRR, no murmurs, gallops, or rubs. no leg edema  Lungs:  diminished bilaterally, no wheeze, no rales, no rhonchi, no crackles, no use of accessory muscles  Abd: bowel sounds present, soft, very tender, nondistended, no masses - multiple open wounds and drains - has 2 urostomies, R colostomy, 2 drains  Extrem:  No clubbing, cyanosis,  no edema, peripheral pulses 1+, no capillary refill on R foot  Skin: no rashes or lesions, pale color/perfusion  Psych:  A & O x3    Neuro: grossly intact, moves all four extremities.   Movement limited due to pain        Assessment:    Active Problems:    AKI (acute kidney injury) ([place])    Hyperkalemia    Open wnd ant  abdomen-comp    Peritonitis and retroperitoneal infections (HCC)    Fistula    Ischemic foot    Cachexia (HCC)    Postprocedural intra-abdominal sepsis (HCC)    Abscess    Dehydration    Encephalopathies    Gangrene of foot ([place])      Plan:  1.  Severe sepsis due to peritonitis - multiple open abd wounds - on IVA bx - no plans for surgery at present - on zosyn and flagyl and bowel rest - wil have repeat CT abd pelvis soon to assess size of abscesses after draininage  2.  R L ischemia/gangrene - s.o thrombectomy on 3/16 - pt will likely lose toes on R foot - has palpable pulses now on R foot  3.  Acute resp failure - pt extubated on 3/17 - controlled  4.  Severe prot cal malnutrition - BMI only 17.9 - pt at risk for losing a lot more weight on TPN  5.  PVD - has stent and plavix at present        Prognosis:  Fair    Code status:  FULL  DVT prophylaxis: []  Lovenox  [x]  IV Heparin  []  SCDs because  []  warfarin/oral direct thrombin inhibitor []  Encourage ambulation    GI prophylaxis: [x]  PPI/H2blocker  []  not indicated    Disposition  transfer to monitored bed    Medications:  Scheduled Meds:  ??? sodium chloride  250 mL Intravenous Once   ??? [START ON 07/03/2015] influenza virus vaccine  0.5 mL Intramuscular Once   ??? aspirin  81 mg Per NG tube Daily   ??? clopidogrel  75 mg Per NG tube Daily   ??? ipratropium-albuterol  1 ampule Inhalation Q4H WA   ??? insulin lispro  0-6 Units Subcutaneous Q4H   ??? sodium chloride flush  10 mL Intravenous 2 times per day   ??? famotidine (PEPCID) injection  20 mg Intravenous Daily   ??? fluconazole  200 mg Intravenous Q24H   ??? metroNIDAZOLE  500 mg Intravenous Q8H   ??? piperacillin-tazobactam  3.375 g Intravenous Q8H   ??? sodium chloride flush  10 mL Intravenous 2 times per day   ??? vancomycin  500 mg Intravenous Q24H   ??? fentaNYL  1 patch Transdermal Q72H       PRN Meds:  acetaminophen, ipratropium-albuterol, magnesium hydroxide, glucose, dextrose, glucagon (rDNA), dextrose, potassium chloride,  potassium chloride, magnesium sulfate, acetaminophen, ondansetron, HYDROmorphone **OR** HYDROmorphone    IV:  ??? PN-Adult Premix 5/20 - Standard Electrolytes - Central Line 50 mL/hr at 06/30/15 1927   ??? sodium chloride 100 mL/hr at 07/01/15 0240   ??? dextrose     ??? [START ON 07/03/2015] fat emulsion           Intake/Output Summary (Last 24 hours) at 07/01/15 1116  Last data filed at 07/01/15 0600   Gross per 24 hour   Intake          3210.17 ml   Output             1480 ml   Net          1730.17 ml       Results:  CBC: Recent Labs      06/29/15   0615  06/30/15   0530  07/01/15   0445   WBC  21.4*  11.6*  7.4   HGB  8.1*  7.5*  6.1*   HCT  26.2*  24.3*  19.3*   MCV  89.5  89.9  89.3   PLT  88*  77*  57*     BMP: Recent Labs      06/29/15   0615   07/01/15   0010  07/01/15   0445  07/01/15   0840   NA  144   145   < >  142  141  140   K  4.1   4.2   < >  4.3  3.8  3.7   CL  118*   117*   < >  116*  114*  114*   CO2  14*   14*   < >  14*  15*  16*   PHOS  2.7   --    --   2.3*   --    BUN  32*   33*   < >  28*  28*  27*   CREATININE  1.1   1.1   < >  0.7  0.7  0.7    < > = values in this interval  not displayed.     Mag: No results for input(s): MAG in the last 72 hours.  LIVER PROFILE:   Recent Labs      06/30/15   1600  07/01/15   0010  07/01/15   0840   AST  32  27  25   ALT  44*  42*  38   BILITOT  0.3  0.3  0.3   ALKPHOS  94  70  70     PT/INR: No results for input(s): PROTIME, INR in the last 72 hours.  APTT: No results for input(s): APTT in the last 72 hours.  UA:No results for input(s): NITRITE, COLORU, PHUR, LABCAST, WBCUA, RBCUA, MUCUS, TRICHOMONAS, YEAST, BACTERIA, CLARITYU, SPECGRAV, LEUKOCYTESUR, UROBILINOGEN, BILIRUBINUR, BLOODU, GLUCOSEU, AMORPHOUS in the last 72 hours.    Invalid input(s): KETONESU    ABG: No results found for: PHART, PH, PCO2ART, PCO2, PO2ART, PO2, HCO3ART, HCO3, BEART, BE, THGBART, THB, TCO2ART, O2SATART, O2SAT    Lab Results   Component Value Date    CALCIUM 7.2 (L) 07/01/2015    PHOS  2.3 (L) 07/01/2015             Electronically signed by Agapito Games, MD on 07/01/2015 at 11:16 AM

## 2015-07-02 ENCOUNTER — Encounter: Primary: Internal Medicine

## 2015-07-02 LAB — COMPREHENSIVE METABOLIC PANEL
ALT: 34 U/L (ref 10–40)
ALT: 36 U/L (ref 10–40)
AST: 22 U/L (ref 15–37)
AST: 25 U/L (ref 15–37)
Albumin/Globulin Ratio: 0.4 — ABNORMAL LOW (ref 1.1–2.2)
Albumin/Globulin Ratio: 0.5 — ABNORMAL LOW (ref 1.1–2.2)
Albumin: 1 g/dL — ABNORMAL LOW (ref 3.4–5.0)
Albumin: 1.2 g/dL — ABNORMAL LOW (ref 3.4–5.0)
Alkaline Phosphatase: 63 U/L (ref 40–129)
Alkaline Phosphatase: 66 U/L (ref 40–129)
Anion Gap: 12 (ref 3–16)
Anion Gap: 12 (ref 3–16)
BUN: 24 mg/dL — ABNORMAL HIGH (ref 7–20)
BUN: 25 mg/dL — ABNORMAL HIGH (ref 7–20)
CO2: 15 mmol/L — ABNORMAL LOW (ref 21–32)
CO2: 15 mmol/L — ABNORMAL LOW (ref 21–32)
Calcium: 7.1 mg/dL — ABNORMAL LOW (ref 8.3–10.6)
Calcium: 7.2 mg/dL — ABNORMAL LOW (ref 8.3–10.6)
Chloride: 111 mmol/L — ABNORMAL HIGH (ref 99–110)
Chloride: 112 mmol/L — ABNORMAL HIGH (ref 99–110)
Creatinine: 0.6 mg/dL (ref 0.6–1.1)
Creatinine: 0.6 mg/dL (ref 0.6–1.1)
GFR African American: >60 (ref >60)
GFR African American: >60 (ref >60)
GFR Non-African American: >60 (ref >60)
GFR Non-African American: >60 (ref >60)
Globulin: 2.4 g/dL
Globulin: 2.6 g/dL
Glucose: 134 mg/dL — ABNORMAL HIGH (ref 70–99)
Glucose: 156 mg/dL — ABNORMAL HIGH (ref 70–99)
Potassium: 3.2 mmol/L — ABNORMAL LOW (ref 3.5–5.1)
Potassium: 3.7 mmol/L (ref 3.5–5.1)
Sodium: 138 mmol/L (ref 136–145)
Sodium: 139 mmol/L (ref 136–145)
Total Bilirubin: 0.4 mg/dL (ref 0.0–1.0)
Total Bilirubin: 0.5 mg/dL (ref 0.0–1.0)
Total Protein: 3.6 g/dL — ABNORMAL LOW (ref 6.4–8.2)
Total Protein: 3.6 g/dL — ABNORMAL LOW (ref 6.4–8.2)

## 2015-07-02 LAB — CBC WITH AUTO DIFFERENTIAL
Bands Relative: 22 % — ABNORMAL HIGH (ref 0–7)
Basophils %: 0 %
Basophils Absolute: 0 10*3/uL (ref 0.0–0.2)
Eosinophils %: 0 %
Eosinophils Absolute: 0 10*3/uL (ref 0.0–0.6)
Hematocrit: 34 % — ABNORMAL LOW (ref 36.0–48.0)
Hemoglobin: 11.4 g/dL — ABNORMAL LOW (ref 12.0–16.0)
Lymphocytes %: 19 %
Lymphocytes Absolute: 1.5 10*3/uL (ref 1.0–5.1)
MCH: 28.9 pg (ref 26.0–34.0)
MCHC: 33.5 g/dL (ref 31.0–36.0)
MCV: 86.5 fL (ref 80.0–100.0)
MPV: 9.4 fL (ref 5.0–10.5)
Metamyelocytes Relative: 5 % — AB
Monocytes %: 2 %
Monocytes Absolute: 0.2 10*3/uL (ref 0.0–1.3)
Neutrophils %: 52 %
Neutrophils Absolute: 6.2 10*3/uL (ref 1.7–7.7)
Platelets: 45 10*3/uL — ABNORMAL LOW (ref 135–450)
RBC: 3.93 M/uL — ABNORMAL LOW (ref 4.00–5.20)
RDW: 16.9 % — ABNORMAL HIGH (ref 12.4–15.4)
WBC: 7.9 10*3/uL (ref 4.0–11.0)

## 2015-07-02 LAB — BASIC METABOLIC PANEL
Anion Gap: 14 (ref 3–16)
BUN: 22 mg/dL — ABNORMAL HIGH (ref 7–20)
CO2: 14 mmol/L — CL (ref 21–32)
Calcium: 7.2 mg/dL — ABNORMAL LOW (ref 8.3–10.6)
Chloride: 109 mmol/L (ref 99–110)
Creatinine: 0.6 mg/dL (ref 0.6–1.1)
GFR African American: >60 (ref >60)
GFR Non-African American: >60 (ref >60)
Glucose: 139 mg/dL — ABNORMAL HIGH (ref 70–99)
Potassium: 4.4 mmol/L (ref 3.5–5.1)
Sodium: 137 mmol/L (ref 136–145)

## 2015-07-02 LAB — POCT GLUCOSE
POC Glucose: 123 mg/dl — ABNORMAL HIGH (ref 70–99)
POC Glucose: 146 mg/dl — ABNORMAL HIGH (ref 70–99)
POC Glucose: 137 mg/dl — ABNORMAL HIGH (ref 70–99)
POC Glucose: 159 mg/dl — ABNORMAL HIGH (ref 70–99)
POC Glucose: 121 mg/dl — ABNORMAL HIGH (ref 70–99)

## 2015-07-02 LAB — CALCIUM, IONIZED
Calcium, Ionized: 1.1 mmol/L — ABNORMAL LOW (ref 1.12–1.32)
pH, Ven: 7.37 (ref 7.35–7.45)

## 2015-07-02 LAB — MAGNESIUM: Magnesium: 1.9 mg/dL (ref 1.80–2.40)

## 2015-07-02 LAB — PHOSPHORUS: Phosphorus: 2.6 mg/dL (ref 2.5–4.9)

## 2015-07-02 MED ORDER — CLINIMIX E/DEXTROSE (5/20) 5 % IV SOLN
5 % | INTRAVENOUS | Status: AC
Start: 2015-07-02 — End: 2015-07-03
  Administered 2015-07-02: 23:00:00 via INTRAVENOUS

## 2015-07-02 MED ORDER — LORAZEPAM 2 MG/ML IJ SOLN
2 MG/ML | INTRAMUSCULAR | Status: DC | PRN
Start: 2015-07-02 — End: 2015-07-04
  Administered 2015-07-02 – 2015-07-04 (×4): 1 mg via INTRAVENOUS

## 2015-07-02 MED ORDER — PIPERACILLIN-TAZOBACTAM 3.375 G IN 50 ML (PREMIX) IVPB EXTENDED
Freq: Three times a day (TID) | INTRAVENOUS | Status: DC
Start: 2015-07-02 — End: 2015-07-04
  Administered 2015-07-02 – 2015-07-04 (×7): 3.375 g via INTRAVENOUS

## 2015-07-02 MED ORDER — SODIUM CHLORIDE 0.9 % IV SOLN
0.9 % | INTRAVENOUS | Status: AC
Start: 2015-07-02 — End: 2015-07-02
  Administered 2015-07-02: 11:00:00 250 via INTRAVENOUS

## 2015-07-02 MED FILL — HYDROMORPHONE HCL 1 MG/ML IJ SOLN: 1 MG/ML | INTRAMUSCULAR | Qty: 1

## 2015-07-02 MED FILL — PLAVIX 75 MG PO TABS: 75 MG | ORAL | Qty: 1

## 2015-07-02 MED FILL — IPRATROPIUM-ALBUTEROL 0.5-2.5 (3) MG/3ML IN SOLN: RESPIRATORY_TRACT | Qty: 3

## 2015-07-02 MED FILL — FLUCONAZOLE IN SODIUM CHLORIDE 200-0.9 MG/100ML-% IV SOLN: INTRAVENOUS | Qty: 100

## 2015-07-02 MED FILL — CLINIMIX E/DEXTROSE (5/20) 5 % IV SOLN: 5 % | INTRAVENOUS | Qty: 2000

## 2015-07-02 MED FILL — FAMOTIDINE 20 MG/2ML IV SOLN: 20 MG/2ML | INTRAVENOUS | Qty: 2

## 2015-07-02 MED FILL — POTASSIUM CHLORIDE 20 MEQ/50ML IV SOLN: 20 MEQ/50ML | INTRAVENOUS | Qty: 50

## 2015-07-02 MED FILL — ZOSYN 3-0.375 GM/50ML IV SOLN: INTRAVENOUS | Qty: 50

## 2015-07-02 MED FILL — FENTANYL 25 MCG/HR TD PT72: 25 MCG/HR | TRANSDERMAL | Qty: 1

## 2015-07-02 MED FILL — NORMAL SALINE FLUSH 0.9 % IV SOLN: 0.9 % | INTRAVENOUS | Qty: 20

## 2015-07-02 MED FILL — OFIRMEV 10 MG/ML IV SOLN: 10 MG/ML | INTRAVENOUS | Qty: 100

## 2015-07-02 MED FILL — SODIUM CHLORIDE 0.45 % IV SOLN: 0.45 % | INTRAVENOUS | Qty: 1000

## 2015-07-02 MED FILL — LORAZEPAM 2 MG/ML IJ SOLN: 2 MG/ML | INTRAMUSCULAR | Qty: 1

## 2015-07-02 MED FILL — NORMAL SALINE FLUSH 0.9 % IV SOLN: 0.9 % | INTRAVENOUS | Qty: 70

## 2015-07-02 MED FILL — SODIUM CHLORIDE 0.9 % IV SOLN: 0.9 % | INTRAVENOUS | Qty: 250

## 2015-07-02 MED FILL — ASPIRIN 81 MG PO CHEW: 81 MG | ORAL | Qty: 1

## 2015-07-02 MED FILL — VANCOMYCIN HCL IN DEXTROSE 500-5 MG/100ML-% IV SOLN: 500-5 MG/100ML-% | INTRAVENOUS | Qty: 100

## 2015-07-02 NOTE — Progress Notes (Signed)
Resting in bed. Cousin in to visit. Assisted w/repositioning.

## 2015-07-02 NOTE — Progress Notes (Signed)
Pt refused HHN, breath sounds are clear and diminished.

## 2015-07-02 NOTE — Progress Notes (Signed)
Discussed Pt with Dr Sherlene Shams, Podiatry, he states he will be in to assess Pt tomorrow.

## 2015-07-02 NOTE — Care Coordination-Inpatient (Signed)
Discharge planning-    Conferred with [facility]. Pre cert for LTAC is to be initiated today. Per the patient's insurance, patient will need to go to the [facility] location [facility] in [place]. Patient is agreeable.    Plan- [facility]/ [facility] at discharge. Pre cert initiated today.    Will continue to follow for support and discharge planning.    -Gershon Mussel, MSW, LSW

## 2015-07-02 NOTE — Progress Notes (Signed)
Vascular    C/o minimal pain in right foot  Right foot demarcating at proximal digit level  Excellent pedal perfusion with palpable femoral, popliteal and dp pulse.  Biphasic pedal signals s/p right EIA covered stent placement  Pt will likely need right TMA and will consult podiatry for assistance.  Will sign off.  Continue antiplatelet for stent  Please contact me with any questions    Peter Congo

## 2015-07-02 NOTE — Progress Notes (Signed)
Pt transported to 3366. Receiving RN at bedside. Discussed in great details abd dressings and materials. All questions answered. Rn aware to call with any further questions.

## 2015-07-02 NOTE — Care Coordination-Inpatient (Signed)
Discharge planning-    Received call from [facility]/ [facility]. Patient has been accepted to the SNF. [facility]/ [name] will speak with the patient/ family tomorrow. Pre cert initiated today.    Patient's mother/ Duanne Kover [phone]; patient's sister/ April Moon (lives locally) [phone].    Plan- Pre cert for Erica Harding initiated today.    Will continue to follow for support and discharge planning.    -Gershon Mussel, MSW, LSW

## 2015-07-02 NOTE — Progress Notes (Signed)
Hospitalist ICU Progress Note    CC: <principal problem not specified>    Hospital course:  45yo F with open abd wounds and peritonitis.  Pt previously lived in [place].  Her sister drove here here and admitted her to the hospital.   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  Pt found to have peritonitis and had fecal contents coming out of her abd wound.  She also had an ischemic R foot and had an urgent R EIA/CFA thrombectomy and stent to EIA.    Admit date: 06/25/2015  Days in hospital: 7    24 Hour Events: pt still debilitated with a lot of pain - likely needs LTAC    Subjective: pt c/o a lot of pain in abd - she is on TPN -she will likely lose part of R foot due to gangrene from thrombus - now with stent on plavix    ROS:  A comprehensive review of systems was negative except for: pain with any movement, some pain in R foot .    Objective:    Visit Vitals   ??? BP 111/81   ??? Pulse 88   ??? Temp 98.1 ??F (36.7 ??C) (Temporal)   ??? Resp 18   ??? Ht 5\' 2"  (1.575 m)   ??? Wt 97 lb 14.2 oz (44.4 kg)   ??? SpO2 100%   ??? BMI 17.9 kg/m2       Gen: chronically ill appearing, pale  HEENT: NC/AT, moist mucous membranes, no oropharyngeal erythema or exudate    Neck: supple, trachea midline, no anterior cervical or SC LAD  Heart:  Normal s1/s2, RRR, no murmurs, gallops, or rubs. no leg edema  Lungs:  diminished bilaterally, no wheeze, no rales, no rhonchi, no crackles, no use of accessory muscles  Abd: bowel sounds present, soft, very tender, nondistended, no masses - multiple open wounds and drains - has 2 urostomies, R colostomy, 2 drains  Extrem:  No clubbing, cyanosis,  no edema, peripheral pulses 1+, no capillary refill on R foot  Skin: no rashes or lesions, pale color/perfusion  Psych:  A & O x3    Neuro: grossly intact, moves all four extremities.   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  Severe sepsis due to peritonitis - multiple open abd wounds - on IVA bx - no plans for surgery at present - on zosyn and flagyl and bowel rest - wil have repeat CT abd pelvis soon to assess size of abscesses after draininage  2.  R L ischemia/gangrene - s.o thrombectomy on 3/16 - pt will likely lose toes on R foot - has palpable pulses now on R foot  3.  Acute resp failure - pt extubated on 3/17 - controlled  4.  Severe prot cal malnutrition - BMI only 17.9 - pt at risk for losing a lot more weight on TPN  5.  PVD - has stent and plavix at present        Prognosis:  Fair    Code status:  FULL  DVT prophylaxis: []  Lovenox  [x]  IV Heparin  []  SCDs because  []  warfarin/oral direct thrombin inhibitor []  Encourage ambulation    GI prophylaxis: [x]  PPI/H2blocker  []  not indicated    Disposition  transfer to monitored bed    Medications:  Scheduled Meds:  ??? piperacillin-tazobactam  3.375 g Intravenous Q8H   ??? ipratropium-albuterol  1 ampule Inhalation TID   ??? influenza virus vaccine  0.5 mL Intramuscular Once   ??? aspirin  81 mg Per NG tube Daily   ??? clopidogrel  75 mg Per NG tube Daily   ??? insulin lispro  0-6 Units Subcutaneous Q4H   ??? sodium chloride flush  10 mL Intravenous 2 times per day   ??? famotidine (PEPCID) injection  20 mg Intravenous Daily   ??? fluconazole  200 mg Intravenous Q24H   ??? sodium chloride flush  10 mL Intravenous 2 times per day   ??? vancomycin  500 mg Intravenous Q24H   ??? fentaNYL  1 patch Transdermal Q72H       PRN Meds:  LORazepam, ipratropium-albuterol, acetaminophen, magnesium hydroxide, glucose, dextrose, glucagon (rDNA), dextrose, potassium chloride, potassium chloride, magnesium sulfate, acetaminophen, ondansetron, HYDROmorphone **OR**  HYDROmorphone    IV:  ??? PN-Adult Premix 5/20 - Standard Electrolytes - Central Line 60 mL/hr at 07/02/15 1831   ??? fat emulsion Stopped (07/02/15 0811)   ??? sodium chloride 100 mL/hr at 07/02/15 2041   ??? dextrose           Intake/Output Summary (Last 24 hours) at 07/03/15 0730  Last data filed at 07/03/15 0654   Gross per 24 hour   Intake          3977.33 ml   Output             2155 ml   Net          1822.33 ml       Results:  CBC:   Recent Labs      07/01/15   0445  07/01/15   1925  07/02/15   0815  07/03/15   0603   WBC  7.4   --   7.9  7.3   HGB  6.1*  10.7*  11.4*  11.7*   HCT  19.3*  32.6*  34.0*  35.2*   MCV  89.3   --   86.5  86.8   PLT  57*   --   45*  53*     BMP:   Recent Labs      07/01/15   0445   07/02/15   0110  07/02/15   0815  07/03/15   0603   NA  141   < >  138  137  133*   K  3.8   < >  3.2*  4.4  4.0   CL  114*   < >  111*  109  107   CO2  15*   < >  15*  14*  16*   PHOS  2.3*   --    --   2.6  2.6   BUN  28*   < >  24*  22*  22*   CREATININE  0.7   < >  0.6  0.6  0.6    < > = values in this interval not displayed.     Mag: No results for input(s): MAG in the last 72 hours.  LIVER  PROFILE:   Recent Labs      07/01/15   0840  07/01/15   1925  07/02/15   0110   AST  25  25  22    ALT  38  36  34   BILITOT  0.3  0.5  0.4   ALKPHOS  70  66  63     PT/INR: No results for input(s): PROTIME, INR in the last 72 hours.  APTT: No results for input(s): APTT in the last 72 hours.  UA:No results for input(s): NITRITE, COLORU, PHUR, LABCAST, WBCUA, RBCUA, MUCUS, TRICHOMONAS, YEAST, BACTERIA, CLARITYU, SPECGRAV, LEUKOCYTESUR, UROBILINOGEN, BILIRUBINUR, BLOODU, GLUCOSEU, AMORPHOUS in the last 72 hours.    Invalid input(s): KETONESU    ABG: No results found for: PHART, PH, PCO2ART, PCO2, PO2ART, PO2, HCO3ART, HCO3, BEART, BE, THGBART, THB, TCO2ART, O2SATART, O2SAT    Lab Results   Component Value Date    CALCIUM 7.4 (L) 07/03/2015    PHOS 2.6 07/03/2015             Electronically signed by Sheryn Bison, MD on  07/02/2015 at 02:30 PM

## 2015-07-02 NOTE — Care Coordination-Inpatient (Signed)
Discharge planning-    Re consulted by the family re: discharge planning needs. The family now chooses [facility]. They identify no other needs at this time.    Called [facility]/ [facility] with the referral, Facesheet sent via [facility]. Referral will be reviewed today, and hopefully pre cert will be initiated today.    [facility]/ [facility] notified.    Plan- [name] at discharge. Pre cert will hopefully be initiated today.    Will continue to follow for support and discharge planning.    -Gershon Mussel, MSW, LSW

## 2015-07-02 NOTE — Progress Notes (Signed)
Erica Harding is a 45 y.o. female patient.  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 piperacillin-tazobactam (ZOSYN) 3.375 g in dextrose 50 mL IVPB extended infusion (premix)  3.375 g Intravenous Q8H Senai Negassi, MD       ??? [COMPLETED] 0.9 % sodium chloride bolus  250 mL Intravenous Once Amitkumar Jerilynn Som, MD 20 mL/hr at 07/02/15 0700 250 mL at 07/02/15 0700   ??? PN-Adult Premix 5/20 - Standard Electrolytes - Central Line   Intravenous Continuous TPN Elly Modena, MD 50 mL/hr at 07/01/15 2036     ??? fat emulsion 20 % infusion 250 mL  250 mL Intravenous Once per day on Mon Thu Elly Modena, MD   Stopped at 07/02/15 [id number]   ??? ipratropium-albuterol (DUONEB) nebulizer solution 1 ampule  1 ampule Inhalation TID Agapito Games, MD   1 ampule at 07/01/15 2016   ??? ipratropium-albuterol (DUONEB) nebulizer solution 1 ampule  1 ampule Inhalation Q4H PRN Agapito Games, MD       ??? [START ON 07/03/2015] influenza quadrivalent split vaccine (FLUZONE;FLUARIX) injection 0.5 mL  0.5 mL Intramuscular Once Maureen Ralphs, MD       ??? 0.45 % sodium chloride infusion   Intravenous Continuous Vicente Masson, MD 100 mL/hr at 07/02/15 0700     ??? aspirin chewable tablet 81 mg  81 mg Per NG tube Daily Neldon Mc, MD   Stopped at 06/28/15 1659   ??? clopidogrel (PLAVIX) tablet 75 mg  75 mg Per NG tube Daily Neldon Mc, MD   Stopped at 06/28/15 1659   ??? acetaminophen (OFIRMEV) infusion 623 mg  15 mg/kg Intravenous Q6H PRN Maureen Ralphs, MD       ??? magnesium hydroxide (MILK OF MAGNESIA) 400 MG/5ML suspension 30 mL  30 mL Oral Daily PRN Domenick Bookbinder II, MD       ??? glucose (GLUTOSE) 40 % oral gel 15 g  15 g Oral PRN Siddharth K Mushrif, MD       ??? dextrose 50 % solution 12.5 g  12.5 g Intravenous PRN  Siddharth K Mushrif, MD   12.5 g at 06/26/15 0010   ??? glucagon (rDNA) injection 1 mg  1 mg Intramuscular PRN Siddharth K Mushrif, MD       ??? dextrose 5 % solution  100 mL/hr Intravenous PRN Siddharth Al Pimple, MD       ??? insulin lispro (HUMALOG) injection pen 0-6 Units  0-6 Units Subcutaneous Q4H Elly Modena, MD   1 Units at 07/02/15 0116   ??? sodium chloride flush 0.9 % injection 10 mL  10 mL Intravenous 2 times per day Brigitte Pulse, MD   10 mL at 07/02/15 0810   ??? potassium chloride 10 mEq/100 mL IVPB (Peripheral Line)  10 mEq Intravenous PRN Brigitte Pulse, MD       ??? potassium chloride 20 mEq/50 mL IVPB (Central Line)  20 mEq Intravenous PRN Brigitte Pulse, MD 50 mL/hr at 07/02/15 0421 20 mEq at 07/02/15 0421   ??? magnesium sulfate 1 g in dextrose 5% 100 mL IVPB premix  1 g Intravenous PRN Brigitte Pulse, MD       ??? acetaminophen (TYLENOL) tablet 650 mg  650 mg Oral Q4H PRN Brigitte Pulse, MD       ???  ondansetron (ZOFRAN) injection 4 mg  4 mg Intravenous Q6H PRN Brigitte Pulse, MD       ??? famotidine (PEPCID) injection 20 mg  20 mg Intravenous Daily Brigitte Pulse, MD   20 mg at 07/02/15 0810   ??? fluconazole (DIFLUCAN) 200 mg in 0.9 % NaCl 100 mL premix IVPB  200 mg Intravenous Q24H Brigitte Pulse, MD 100 mL/hr at 07/02/15 0809 200 mg at 07/02/15 0809   ??? sodium chloride flush 0.9 % injection 10 mL  10 mL Intravenous 2 times per day Delorse Limber, APRN   10 mL at 07/02/15 [id number]   ??? vancomycin (VANCOCIN) 500 mg in dextrose 5% 100 mL IVPB  500 mg Intravenous Q24H Brigitte Pulse, MD   Stopped at 07/02/15 0909   ??? fentaNYL (DURAGESIC) 25 MCG/HR 1 patch  1 patch Transdermal Q72H Brigitte Pulse, MD   1 patch at 07/01/15 2131   ??? HYDROmorphone (DILAUDID) injection 0.5 mg  0.5 mg Intravenous Q2H PRN Brigitte Pulse, MD   0.5 mg at 06/29/15 2024    Or   ??? HYDROmorphone (DILAUDID) injection 1 mg  1 mg Intravenous Q2H PRN Brigitte Pulse, MD   1 mg at 07/02/15 [id number]     Allergies   Allergen Reactions   ???  Latex Itching   ??? Compazine [Prochlorperazine Maleate] Swelling     Active Problems:    AKI (acute kidney injury) ([place])    Hyperkalemia    Open wnd ant abdomen-comp    Peritonitis and retroperitoneal infections (HCC)    Fistula    Ischemic foot    Cachexia (HCC)    Postprocedural intra-abdominal sepsis (HCC)    Abscess    Dehydration    Encephalopathies    Gangrene of foot (HCC)    Blood pressure 120/90, pulse 89, temperature 97.8 ??F (36.6 ??C), temperature source Temporal, resp. rate 19, height 5\' 2"  (1.575 m), weight 97 lb 14.2 oz (44.4 kg), SpO2 100 %.    Subjective patient awake, alert, weak, refusing to participate in care    Objective:  Vital signs: (most recent): Blood pressure 120/90, pulse 89, temperature 97.8 ??F (36.6 ??C), temperature source Temporal, resp. rate 19, height 5\' 2"  (1.575 m), weight 97 lb 14.2 oz (44.4 kg), SpO2 100 %.    Output: Producing urine.    Lungs:  Normal respiratory rate and normal effort.    abdomen soft  Enteric output from fistulas and drain      Assessment & Plan 45 year old female admitted with sepsis, multiple intestinal fistulas and a pelvic abscess. She is S/P percutaneous drainage of the pelvic abscess. Continue bowel rest, TPN and broad spectrum antibiotics.   Attempted to get abd CT yesterday.  Pt does not think she can tolerate contrast or lay for test.  Will reattempt today without oral contrast.      Delorse Limber, APRN  07/02/2015     Patient seen and examined. She refused oral contrast and CT scan of the abdomen and pelvis yesterday. Had lengthy discussion with patient about the importance of the CT scan in order to rule out other intra-abdominal fluid collections or abscesses. The CT scan was again ordered. If she refuses the CT scan again, would recommend transfer to LTAC.

## 2015-07-02 NOTE — Care Coordination-Inpatient (Signed)
[facility] Continence Nurse  Follow-up Progress Note       NAME:  Erica Harding  MEDICAL RECORD NUMBER:  [id number]  AGE:  45 y.o.   GENDER:  female  DOB:  11-19-1970  TODAY'S DATE:  07/02/2015    Subjective:   Wound Identification:  Wound Type: pressure and non-healing surgical  Contributing Factors: chronic pressure, decreased mobility and shear force        Patient Goal of Care:  []  Wound Healing  []  Odor Control  []  Palliative Care  []  Pain Control   []  Other:     Objective:      Visit Vitals   ??? BP 123/78   ??? Pulse 75   ??? Temp 98.5 ??F (36.9 ??C) (Temporal)   ??? Resp 19   ??? Ht 5\' 2"  (1.575 m)   ??? Wt 97 lb 14.2 oz (44.4 kg)   ??? SpO2 100%   ??? BMI 17.9 kg/m2     Braden Risk Score: Braden Scale Score: 13  Assessment:   Measurements:  Pressure Ulcer 06/25/15 Sacrum Left (Active)   Peri-wound Assessment Clean;Dry 07/02/2015  2:23 PM   Pressure Ulcer Staging Stage II 07/02/2015  2:23 PM   Assessment Pink;Clean 07/02/2015  2:23 PM   Shape irregx2 07/02/2015  2:23 PM   Dressing Status Intact 07/02/2015  8:45 AM   Dressing Changed Changed/New 07/02/2015  2:23 PM   Dressing/Treatment Foam 07/02/2015  2:23 PM   Wound Cleansed Rinsed/Irrigated with saline 07/02/2015  2:23 PM   Dressing Change Due 07/05/15 07/02/2015  2:23 PM   Drainage Amount None 07/02/2015  2:23 PM   Odor None 07/02/2015  2:23 PM   Pink%Wound Bed 100 07/02/2015  2:23 PM   Yellow%Wound Bed 75 07/02/2015  4:00 AM   Margins Defined edges 07/02/2015  2:23 PM   Number of days:7       Pressure Ulcer 06/25/15 Hip Right (Active)   Peri-wound Assessment Clean;Dry 07/02/2015  2:23 PM   Pressure Ulcer Staging Unstagable 07/02/2015  2:23 PM   Assessment Slough 07/02/2015  2:23 PM   Shape oval 07/02/2015  2:23 PM   Wound Length (cm) 1.5 cm 07/02/2015  2:23 PM   Wound Width (cm) 1 cm 07/02/2015  2:23 PM   Calculated Wound Size (cm^2) (l*w) 1.5 cm^2 07/02/2015  2:23 PM   Change in Wound Size % (l*w) 0 07/02/2015  2:23 PM   Dressing Status Changed 07/02/2015   2:23 PM   Dressing Changed Changed/New 07/02/2015  2:23 PM   Dressing/Treatment Foam 07/02/2015  2:23 PM   Wound Cleansed Rinsed/Irrigated with saline 07/02/2015  2:23 PM   Dressing Change Due 07/05/15 07/02/2015  2:23 PM   Drainage Amount None 07/02/2015  2:23 PM   Odor None 07/02/2015  2:23 PM   Margins Defined edges 07/02/2015  2:23 PM   Number of days:7       Wound 06/25/15 Other (Comment) Abdomen Anterior;Mid midline abdominal surgical incision (Active)   Peri-wound Assessment Clean;Dry;Intact 07/02/2015  2:23 PM   Peri-Wound Texture Localized edema 06/30/2015  5:04 PM   Peri-Wound Moisture Weeping 06/30/2015  5:04 PM   Peri-Wound Color Red 06/30/2015  5:04 PM   Non-staged Wound Description Full thickness 07/02/2015  2:23 PM   Assessment Red;Drainage 07/02/2015  2:23 PM   Shape linear/irreg 07/02/2015  2:23 PM   Wound Length (cm) 12 cm 07/02/2015  2:23 PM   Wound Width (cm) 4 cm 07/02/2015  2:23 PM   Depth (  cm)  3 07/02/2015  2:23 PM   Calculated Wound Size (cm^2) (l*w) 48 cm^2 07/02/2015  2:23 PM   Change in Wound Size % (l*w) 0 07/02/2015  2:23 PM   Closure None 07/02/2015  2:23 PM   Dressing Status Old drainage 07/02/2015  2:23 PM   Dressing Changed Changed/New 07/02/2015  2:23 PM   Dressing/Treatment Moist to dry 07/02/2015  2:23 PM   Wound Cleansed Rinsed/Irrigated with saline 07/02/2015  2:23 PM   Dressing Change Due 07/02/15 07/02/2015  2:23 PM   Drainage Amount Large 07/02/2015  2:23 PM   Drainage Description Brown;Green 07/02/2015  2:23 PM   Odor None 07/02/2015  2:23 PM   Margins Defined edges 07/02/2015  2:23 PM   Procedural Pain 10 06/25/2015  1:15 PM   Number of days:7       Wound 06/25/15 Other (Comment) Abdomen Left;Lateral open incision to left lateral abdominal wall (Active)   Peri-wound Assessment Clean;Dry;Intact 07/02/2015  2:23 PM   Peri-Wound Texture Localized edema 06/30/2015  5:04 PM   Peri-Wound Moisture Weeping 06/30/2015  5:04 PM   Non-staged Wound Description Full thickness 07/02/2015  2:23 PM   Assessment  Pink;Drainage;Painful 07/02/2015  2:23 PM   Shape oval 07/02/2015  2:23 PM   Wound Length (cm) 6 cm 07/02/2015  2:23 PM   Wound Width (cm) 3 cm 07/02/2015  2:23 PM   Depth (cm)  3 07/02/2015  2:23 PM   Calculated Wound Size (cm^2) (l*w) 18 cm^2 07/02/2015  2:23 PM   Change in Wound Size % (l*w) -200 07/02/2015  2:23 PM   Closure Other (Comment) 06/30/2015  5:04 PM   Dressing Status Old drainage;Changed;New drainage 07/02/2015  2:23 PM   Dressing Changed Changed/New 07/02/2015  2:23 PM   Dressing/Treatment Other (Comment) 07/02/2015  2:23 PM   Wound Cleansed Rinsed/Irrigated with saline 07/02/2015  2:23 PM   Dressing Change Due 06/29/15 06/29/2015  1:00 PM   Drainage Amount Moderate 07/02/2015  2:23 PM   Drainage Description [place] 07/02/2015  2:23 PM   Odor None 07/02/2015  2:23 PM   Pink%Wound Bed 100 07/02/2015  2:23 PM   Margins Defined edges 07/02/2015  2:23 PM   Number of days:7      fistula pouch changed to midline abd.  Ostomy pouch placed over left lateral abd wound  Toes on right foot demarcating, plans for transmet amp at some point  Coccyx wounds x2 measure 2x1.5x0.1cm and 1.5x1x0.1 cm.  Nephrostomy tube sites intact. Dressing changed to left side.  Dressings changed to LLQ drain site.    Response to treatment:  Poorly tolerated by patient. Pt is anxious, premedicated by RN.      Plan:   Plan of Care: Wound 06/25/15 Other (Comment) Abdomen Anterior;Mid midline abdominal surgical incision-Dressing/Treatment: Moist to dry  Wound 06/25/15 Other (Comment) Abdomen Left;Lateral open incision to left lateral abdominal wall-Dressing/Treatment: Other (Comment) (pouched)  Pressure Ulcer 06/25/15 Sacrum Left-Dressing/Treatment: Foam  [REMOVED] Wound 06/25/15 Other (Comment) Leg Lower;Right unstageable lateral right thigh -  see pressure ulcer LDA-Dressing/Treatment: Open to air  Pressure Ulcer 06/25/15 Hip Right-Dressing/Treatment: Foam  -foam to sacral area and right hip  -fistula pouch to midline incision - moist saline dressings  three times a day  -ostomy pouch to left lateral incision  -  Specialty Bed Required : Yes - will need low air loss once transferred ; sentech stage 4  [x]  Low Air Loss   []  Pressure Redistribution  []  Fluid Immersion  []   Bariatric  []  Total Pressure Relief  []  Other:     Current Diet: Diet NPO Effective Now  PN-Adult Premix 5/20 - Standard Electrolytes - Central Line  PN-Adult Premix 5/20 - Standard Electrolytes - Central Line  Dietician consult:  Yes    Discharge Plan:  Placement for patient upon discharge: LTAC  Patient appropriate for Outpatient [facility]: Yes?    Referrals:  [x]  Education officer, museum  []  [place]  []  Supplies  []  Other    Patient/Caregiver Teaching:  Level of patient/caregiver understanding able to:   []  Indicates understanding       [x]  Needs reinforcement  []  Unsuccessful      []  Verbal Understanding  []  Demonstrated understanding       []  No evidence of learning  []  Refused teaching         []  N/A       Electronically signed by Carollee Herter, CWOCN on 07/02/2015 at 2:37 PM

## 2015-07-02 NOTE — Progress Notes (Signed)
Pt tearful, often refusing repositioning or dressing change/reinforcement. "Can't you just leave me alone and let me rest a while".  Attempted to allow rest after pain medication but finally mid abdominal 4x4's changed w/encouragement. Minimal repositioning with encouragement.

## 2015-07-02 NOTE — Progress Notes (Signed)
Surgical NP into see Pt. Pt continues to refuse CT of abd. Np states they can not do Wound Vac on Pt without imaging. Pt continues to state that every is too overwhelming. NP states that Dr Rodney Booze will be up shortly to talk to Pt about needed testing and treatment options for Pt. Will continue to monitor.

## 2015-07-02 NOTE — Progress Notes (Signed)
Port dressing changed, without difficulties. New dressing changed. Transport at bedside.

## 2015-07-02 NOTE — Progress Notes (Signed)
Dressing change done, with Kennard RN. Her recommendations are to change mid abdominal 4x4's 3 times a day starting this evening and empty ostomy beg over L lateral wound as needed. Meplex heart and border changed on sacrum and R hip both dressing due to be changed on 3/24 or when soiled. Pt was very anxious during dressing change but tolerated well being premedicated. Total linen change and bath done. Pt now resting quietly in bed with visitors. Will continue to monitor.

## 2015-07-03 ENCOUNTER — Encounter: Primary: Internal Medicine

## 2015-07-03 LAB — CBC WITH AUTO DIFFERENTIAL
Atypical Lymphocytes Relative: 1 % (ref 0–6)
Bands Relative: 20 % — ABNORMAL HIGH (ref 0–7)
Basophils %: 0 %
Basophils Absolute: 0 10*3/uL (ref 0.0–0.2)
Blasts Relative: 1 % — AB
Eosinophils %: 1 %
Eosinophils Absolute: 0.1 10*3/uL (ref 0.0–0.6)
Hematocrit: 35.2 % — ABNORMAL LOW (ref 36.0–48.0)
Hemoglobin: 11.7 g/dL — ABNORMAL LOW (ref 12.0–16.0)
Lymphocytes %: 30 %
Lymphocytes Absolute: 2.3 10*3/uL (ref 1.0–5.1)
MCH: 28.8 pg (ref 26.0–34.0)
MCHC: 33.2 g/dL (ref 31.0–36.0)
MCV: 86.8 fL (ref 80.0–100.0)
MPV: 9.5 fL (ref 5.0–10.5)
Monocytes %: 3 %
Monocytes Absolute: 0.2 10*3/uL (ref 0.0–1.3)
Neutrophils %: 44 %
Neutrophils Absolute: 4.7 10*3/uL (ref 1.7–7.7)
PLATELET SLIDE REVIEW: DECREASED
Platelets: 53 10*3/uL — ABNORMAL LOW (ref 135–450)
RBC: 4.06 M/uL (ref 4.00–5.20)
RDW: 17.3 % — ABNORMAL HIGH (ref 12.4–15.4)
WBC: 7.3 10*3/uL (ref 4.0–11.0)

## 2015-07-03 LAB — POCT GLUCOSE
POC Glucose: 116 mg/dl — ABNORMAL HIGH (ref 70–99)
POC Glucose: 136 mg/dl — ABNORMAL HIGH (ref 70–99)
POC Glucose: 112 mg/dl — ABNORMAL HIGH (ref 70–99)
POC Glucose: 121 mg/dl — ABNORMAL HIGH (ref 70–99)
POC Glucose: 142 mg/dl — ABNORMAL HIGH (ref 70–99)
POC Glucose: 140 mg/dl — ABNORMAL HIGH (ref 70–99)

## 2015-07-03 LAB — BASIC METABOLIC PANEL
Anion Gap: 10 (ref 3–16)
BUN: 22 mg/dL — ABNORMAL HIGH (ref 7–20)
CO2: 16 mmol/L — ABNORMAL LOW (ref 21–32)
Calcium: 7.4 mg/dL — ABNORMAL LOW (ref 8.3–10.6)
Chloride: 107 mmol/L (ref 99–110)
Creatinine: 0.6 mg/dL (ref 0.6–1.1)
GFR African American: >60 (ref >60)
GFR Non-African American: >60 (ref >60)
Glucose: 107 mg/dL — ABNORMAL HIGH (ref 70–99)
Potassium: 4 mmol/L (ref 3.5–5.1)
Sodium: 133 mmol/L — ABNORMAL LOW (ref 136–145)

## 2015-07-03 LAB — PHOSPHORUS: Phosphorus: 2.6 mg/dL (ref 2.5–4.9)

## 2015-07-03 LAB — MAGNESIUM: Magnesium: 1.7 mg/dL — ABNORMAL LOW (ref 1.80–2.40)

## 2015-07-03 MED ORDER — SODIUM CHLORIDE 0.9 % IV SOLN
0.9 % | INTRAVENOUS | Status: AC
Start: 2015-07-03 — End: 2015-07-03
  Administered 2015-07-03: 16:00:00 250

## 2015-07-03 MED ORDER — MAGNESIUM SULFATE 2000 MG/50 ML IVPB PREMIX
2 GM/50ML | Freq: Once | INTRAVENOUS | Status: AC
Start: 2015-07-03 — End: 2015-07-03
  Administered 2015-07-03: 17:00:00 2 g via INTRAVENOUS

## 2015-07-03 MED ORDER — TRACE MINERALS CR-CU-MN-SE-ZN 10-1000-500-60 MCG/ML IV SOLN
10-1000-500-60 MCG/ML | INTRAVENOUS | Status: AC
Start: 2015-07-03 — End: 2015-07-04
  Administered 2015-07-03: 22:00:00 via INTRAVENOUS

## 2015-07-03 MED FILL — NORMAL SALINE FLUSH 0.9 % IV SOLN: 0.9 % | INTRAVENOUS | Qty: 10

## 2015-07-03 MED FILL — IPRATROPIUM-ALBUTEROL 0.5-2.5 (3) MG/3ML IN SOLN: RESPIRATORY_TRACT | Qty: 3

## 2015-07-03 MED FILL — NORMAL SALINE FLUSH 0.9 % IV SOLN: 0.9 % | INTRAVENOUS | Qty: 40

## 2015-07-03 MED FILL — SODIUM CHLORIDE 0.45 % IV SOLN: 0.45 % | INTRAVENOUS | Qty: 1000

## 2015-07-03 MED FILL — HYDROMORPHONE HCL 1 MG/ML IJ SOLN: 1 MG/ML | INTRAMUSCULAR | Qty: 1

## 2015-07-03 MED FILL — FAMOTIDINE 20 MG/2ML IV SOLN: 20 MG/2ML | INTRAVENOUS | Qty: 2

## 2015-07-03 MED FILL — ZOSYN 3-0.375 GM/50ML IV SOLN: INTRAVENOUS | Qty: 50

## 2015-07-03 MED FILL — NORMAL SALINE FLUSH 0.9 % IV SOLN: 0.9 % | INTRAVENOUS | Qty: 20

## 2015-07-03 MED FILL — VANCOMYCIN HCL IN DEXTROSE 500-5 MG/100ML-% IV SOLN: 500-5 MG/100ML-% | INTRAVENOUS | Qty: 100

## 2015-07-03 MED FILL — FLUCONAZOLE IN SODIUM CHLORIDE 200-0.9 MG/100ML-% IV SOLN: INTRAVENOUS | Qty: 100

## 2015-07-03 MED FILL — NORMAL SALINE FLUSH 0.9 % IV SOLN: 0.9 % | INTRAVENOUS | Qty: 50

## 2015-07-03 MED FILL — SODIUM CHLORIDE 0.9 % IV SOLN: 0.9 % | INTRAVENOUS | Qty: 250

## 2015-07-03 MED FILL — CLINIMIX E/DEXTROSE (5/20) 5 % IV SOLN: 5 % | INTRAVENOUS | Qty: 2000

## 2015-07-03 MED FILL — MAGNESIUM SULFATE 2 GM/50ML IV SOLN: 2 GM/50ML | INTRAVENOUS | Qty: 50

## 2015-07-03 NOTE — Consults (Signed)
Mg 1.7-replaced yesterday with 2g Mag Sulfate. Will repeat 2g now and add 2g to tonight's TPN due to frequent replacement needs  Na/Cl dropping. Currently on 0.45% NaCl-ok to stop per discussion with Dr. Andree Elk.   TPN to continue at goal 69ml/hr    Jewel Baize PharmD, BCPS

## 2015-07-03 NOTE — Plan of Care (Signed)
Problem: Risk for Impaired Skin Integrity  Goal: Tissue integrity - skin and mucous membranes  Structural intactness and normal physiological function of skin and  mucous membranes.   Intervention: SKIN ASSESSMENT  See flowsheets.   Intervention: TURN PATIENT  Pt currently refusing turns. Encouraged to turn self in response.       Problem: Falls - Risk of  Intervention: Fall risk assessment  Pt is a high fall risk.   Intervention: Fall precautions  Call light within reach- bed in lowest position- wheels locked- side rails in use- bed alarm on- pt verbalizes understanding of need to call when wanting to get out of bed.       Problem: Infection, Septic Shock:  Intervention: Monitor temperature  Pt temperature currently 97.4 F.  Intervention: Assess signs and symptoms of septic shock  Pt is free from any new onset of signs and symptoms of infection.

## 2015-07-03 NOTE — Progress Notes (Signed)
Pt more cooperative. Abdominal dressing changes completed- see flowsheets.  IV dilaudid administered.

## 2015-07-03 NOTE — Progress Notes (Signed)
Pt is refusing abdominal dressing changes for multiple drain sites which have saturated through gown and covers. Changed gown and covers and reinforced saturated dressings with gauze and ABD pads- encouraged dressing changes but pt still refused.. Administered IV dilaudid- verbalized understanding of need for dressing change within the next couple of hours but would like to wait for now. Will continue to monitor. Darla Lesches

## 2015-07-03 NOTE — Plan of Care (Signed)
Problem: Falls - Risk of  Intervention: Fall risk assessment  Scored high on fall risk assessment.  Siderails up x3, bed in lowest position, bed wheels locked, and SAFE to door.  Patient declines nonskid socks.  Bed alarm on and callbell in reach. Gala Murdoch, RN      Problem: Wound:  Intervention: Assess pain status  Patient in pain, received pain medication and fell alsleep and is hurting at this time.  Awaiting pharmacy to send up IV tylenol, PRN dilaudid not due at this time.  Patient would not allow abdomen to be touched, dressings saturated, made patient aware of need to change dressings and she declined.       Problem: Anxiety/Stress  Goal: Level of anxiety will decrease  Level of anxiety will decrease   Intervention: ACKNOWLEDGE FEAR AND ANXIETY  Patient states she is anxious, PRN ativan offered but declined.  States she just wants to get out of here and go home.

## 2015-07-03 NOTE — Progress Notes (Signed)
Infectious Disease Follow up Notes    CC :  peritonitis     Antibiotics:   Fluconazole 200 IV q24  Flagyl 500 IV q8   Zosyn 3.375 q8  vanc 500 IV q24     Admit Date:   06/25/2015  Hospital Day: 9    Subjective:     Remains afebrile   Says she is uncomfortable  Says she will try to sit in a chair today and will try to get CT done     Objective:     Patient Vitals for the past 8 hrs:   BP Temp Temp src Pulse Resp SpO2 Weight   07/03/15 0847 126/87 97.3 ??F (36.3 ??C) Axillary 86 14 100 % -   07/03/15 0751 - - - - 16 99 % -   07/03/15 0732 - - - - - - 112 lb 4.8 oz (50.9 kg)   07/03/15 0530 111/81 98.1 ??F (36.7 ??C) Temporal 88 18 - -       EXAM:  General:  Thin.  More alert today.  NAD    HEENT:  Sclera white, no conjunctival lesions.  No thrush, MMM    NECK:  supple   LUNGS:  CTA upper lobes   CV:  RRR  ABD: Tender, hypoactive bowel sounds.   Drain LLQ to [employer] with wounds packed  Nephrostomy bil     EXT:  Ischemic 5 toes right foot, feet warm      SKIN: No rash      LINE: R IJ CVL site ok       Scheduled Meds:  ??? piperacillin-tazobactam  3.375 g Intravenous Q8H   ??? ipratropium-albuterol  1 ampule Inhalation TID   ??? influenza virus vaccine  0.5 mL Intramuscular Once   ??? aspirin  81 mg Per NG tube Daily   ??? clopidogrel  75 mg Per NG tube Daily   ??? insulin lispro  0-6 Units Subcutaneous Q4H   ??? sodium chloride flush  10 mL Intravenous 2 times per day   ??? famotidine (PEPCID) injection  20 mg Intravenous Daily   ??? fluconazole  200 mg Intravenous Q24H   ??? sodium chloride flush  10 mL Intravenous 2 times per day   ??? vancomycin  500 mg Intravenous Q24H   ??? fentaNYL  1 patch Transdermal Q72H       Continuous Infusions:  ??? PN-Adult Premix 5/20 - Standard Electrolytes - Central Line 60 mL/hr at 07/02/15 1831   ??? fat emulsion Stopped (07/02/15 0811)   ??? sodium chloride 100 mL/hr at 07/02/15 2041   ??? dextrose            Data Review:    Lab  Results   Component Value Date    WBC 7.3 07/03/2015    HGB 11.7 (L) 07/03/2015    HCT 35.2 (L) 07/03/2015    MCV 86.8 07/03/2015    PLT 53 (L) 07/03/2015     Lab Results   Component Value Date    CREATININE 0.6 07/03/2015    BUN 22 (H) 07/03/2015    NA 133 (L) 07/03/2015    K 4.0 07/03/2015    CL 107 07/03/2015    CO2 16 (L) 07/03/2015       Hepatic Function Panel:   Lab Results   Component Value Date    ALKPHOS 63 07/02/2015    ALT 34 07/02/2015    AST 22 07/02/2015    PROT 3.6 07/02/2015  BILITOT 0.4 07/02/2015    LABALBU 1.2 07/02/2015         MICRO:  3/14 BC neg  3/15 Abscess fluid cx Bacteroides, Enterococcus faecalis/faecium amp-R      IMAGING:  Reviewed         Assessment:     Patient Active Problem List    Diagnosis Date Noted   ??? Abscess    ??? Dehydration    ??? Encephalopathies    ??? Gangrene of foot ([place])    ??? Postprocedural intra-abdominal sepsis ([place])    ??? Ischemic foot    ??? Cachexia ([place])    ??? AKI (acute kidney injury) ([place])    ??? Hyperkalemia    ??? Open wnd ant abdomen-comp    ??? Peritonitis and retroperitoneal infections ([place])    ??? 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

## 2015-07-03 NOTE — Progress Notes (Signed)
Pt anxious and tearful- refused to be repositioned or turned. Requested abdominal dressing change be done in morning instead of this evening. IV dilaudid administered in response to reports of pain by pt. Abdomen assessed- see flowsheets. Darla Lesches

## 2015-07-03 NOTE — Progress Notes (Signed)
Erica Harding is a 44 y.o. female patient.  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 PN-Adult Premix 5/20 - Standard Electrolytes - Central Line   Intravenous Continuous TPN Elly Modena, MD       ??? magnesium sulfate 2 g in 50 mL IVPB premix  2 g Intravenous Once Elly Modena, MD 25 mL/hr at 07/03/15 1302 2 g at 07/03/15 1302   ??? piperacillin-tazobactam (ZOSYN) 3.375 g in dextrose 50 mL IVPB extended infusion (premix)  3.375 g Intravenous Q8H Sheryn Bison, MD 12.5 mL/hr at 07/03/15 1203 3.375 g at 07/03/15 1203   ??? PN-Adult Premix 5/20 - Standard Electrolytes - Central Line   Intravenous Continuous TPN Elly Modena, MD 60 mL/hr at 07/02/15 1831     ??? LORazepam (ATIVAN) injection 1 mg  1 mg Intravenous Q4H PRN Sheryn Bison, MD   1 mg at 07/02/15 1830   ??? fat emulsion 20 % infusion 250 mL  250 mL Intravenous Once per day on Mon Thu Elly Modena, MD   Stopped at 07/02/15 [id number]   ??? ipratropium-albuterol (DUONEB) nebulizer solution 1 ampule  1 ampule Inhalation TID Agapito Games, MD   1 ampule at 07/03/15 0749   ??? ipratropium-albuterol (DUONEB) nebulizer solution 1 ampule  1 ampule Inhalation Q4H PRN Agapito Games, MD       ??? influenza quadrivalent split vaccine (FLUZONE;FLUARIX) injection 0.5 mL  0.5 mL Intramuscular Once Maureen Ralphs, MD       ??? aspirin chewable tablet 81 mg  81 mg Per NG tube Daily Neldon Mc, MD   Stopped at 06/28/15 1659   ??? clopidogrel (PLAVIX) tablet 75 mg  75 mg Per NG tube Daily Neldon Mc, MD   Stopped at 06/28/15 1659   ??? acetaminophen (OFIRMEV) infusion 623 mg  15 mg/kg Intravenous Q6H PRN Maureen Ralphs, MD 400 mL/hr at 07/03/15 1029 623 mg at 07/03/15 1029   ??? magnesium hydroxide (MILK OF MAGNESIA) 400 MG/5ML suspension 30 mL  30 mL  Oral Daily PRN Domenick Bookbinder II, MD       ??? glucose (GLUTOSE) 40 % oral gel 15 g  15 g Oral PRN Siddharth K Mushrif, MD       ??? dextrose 50 % solution 12.5 g  12.5 g Intravenous PRN Siddharth K Mushrif, MD   12.5 g at 06/26/15 0010   ??? glucagon (rDNA) injection 1 mg  1 mg Intramuscular PRN Siddharth K Mushrif, MD       ??? dextrose 5 % solution  100 mL/hr Intravenous PRN Siddharth Al Pimple, MD       ??? insulin lispro (HUMALOG) injection pen 0-6 Units  0-6 Units Subcutaneous Q4H Elly Modena, MD   Stopped at 07/03/15 1034   ??? sodium chloride flush 0.9 % injection 10 mL  10 mL Intravenous 2 times per day Brigitte Pulse, MD   10 mL at 07/02/15 2049   ??? potassium chloride 10 mEq/100 mL IVPB (Peripheral Line)  10 mEq Intravenous PRN Brigitte Pulse, MD       ??? potassium chloride 20 mEq/50 mL IVPB (Central Line)  20 mEq Intravenous PRN Brigitte Pulse, MD 50 mL/hr at 07/02/15 0421 20 mEq at 07/02/15 0421   ??? magnesium sulfate 1 g in dextrose 5% 100 mL IVPB  premix  1 g Intravenous PRN Brigitte Pulse, MD       ??? acetaminophen (TYLENOL) tablet 650 mg  650 mg Oral Q4H PRN Brigitte Pulse, MD       ??? ondansetron [facility]) injection 4 mg  4 mg Intravenous Q6H PRN Brigitte Pulse, MD       ??? famotidine (PEPCID) injection 20 mg  20 mg Intravenous Daily Brigitte Pulse, MD   20 mg at 07/03/15 0851   ??? fluconazole (DIFLUCAN) 200 mg in 0.9 % NaCl 100 mL premix IVPB  200 mg Intravenous Q24H Brigitte Pulse, MD 100 mL/hr at 07/03/15 1203 200 mg at 07/03/15 1203   ??? sodium chloride flush 0.9 % injection 10 mL  10 mL Intravenous 2 times per day Delorse Limber, APRN   10 mL at 07/02/15 2041   ??? vancomycin (VANCOCIN) 500 mg in dextrose 5% 100 mL IVPB  500 mg Intravenous Q24H Brigitte Pulse, MD   Stopped at 07/03/15 1035   ??? fentaNYL (DURAGESIC) 25 MCG/HR 1 patch  1 patch Transdermal Q72H Brigitte Pulse, MD   1 patch at 07/01/15 2131   ??? HYDROmorphone (DILAUDID) injection 0.5 mg  0.5 mg Intravenous Q2H PRN Brigitte Pulse, MD    0.5 mg at 06/29/15 2024    Or   ??? HYDROmorphone (DILAUDID) injection 1 mg  1 mg Intravenous Q2H PRN Brigitte Pulse, MD   1 mg at 07/03/15 1302     Allergies   Allergen Reactions   ??? Latex Itching   ??? Compazine [Prochlorperazine Maleate] Swelling     Active Problems:    AKI (acute kidney injury) ([place])    Hyperkalemia    Open wnd ant abdomen-comp    Peritonitis and retroperitoneal infections ([facility])    Fistula    Ischemic foot    Cachexia ([facility])    Postprocedural intra-abdominal sepsis ([facility])    Abscess    Dehydration    Encephalopathies    Gangrene of foot ([facility])    Blood pressure 114/74, pulse 78, temperature 97.2 ??F (36.2 ??C), resp. rate 16, height 5\' 2"  (1.575 m), weight 112 lb 4.8 oz (50.9 kg), SpO2 100 %.    Subjective patient awake, alert, weak, refusing to participate in care    Objective:  Vital signs: (most recent): Blood pressure 114/74, pulse 78, temperature 97.2 ??F (36.2 ??C), resp. rate 16, height 5\' 2"  (1.575 m), weight 112 lb 4.8 oz (50.9 kg), SpO2 100 %.    Output: Producing urine.    Lungs:  Normal respiratory rate and normal effort.    abdomen soft  Enteric output from fistulas and drain      Assessment & Plan 45 year old female admitted with sepsis, multiple intestinal fistulas and a pelvic abscess. She is S/P percutaneous drainage of the pelvic abscess. Continue bowel rest, TPN and broad spectrum antibiotics.   Attempted to get abd CT again yesterday.  She is in agreeable to get today, if not would set up DC for [place], APRN  07/03/2015     Clinically about the same. She is finally agreeable to CT scan. Will obtain CT scan later today.

## 2015-07-03 NOTE — Plan of Care (Signed)
Problem: Falls - Risk of  Goal: Absence of falls  Outcome: Ongoing  Patient to remain free from accidental injury. Bed in locked/lowest position. Alarm/fall risk band/yellow blanket on. Will anticipate needs in advance. Patient verbalized understanding to call for assistance, call light in reach.

## 2015-07-03 NOTE — Progress Notes (Signed)
Changed Bed Pad and gown but patient refused dressing change at this time. Call light in reach. Will continue to monitor.

## 2015-07-03 NOTE — Care Coordination-Inpatient (Signed)
Met with patient and left a message with her sister April to confirm plan.  Erica Harding, [phone],  @ [name] to verify medical update for [facility] precert.  MD, Please complete & sign the e-COC under the discharge instructions, AVS/Prescriptions, and or medical necessity note for assistance with discharge planning, Thank you, Sofie Hartigan, MSW, [phone]

## 2015-07-03 NOTE — Progress Notes (Signed)
Attempted to send patient down to CT scan. Patient became tearful/anxious, refused to go at this time. Explained the need for evaluation patient agreeable to go for CT scan when sister/April arrives. Notified Tech in CT.

## 2015-07-03 NOTE — Progress Notes (Signed)
[name] PROGRESS NOTE    07/03/2015 11:38 AM        Name: Erica Harding .              Admitted: 06/25/2015  Primary Care Provider: Referring Not In System (Inactive) (Tel: None)      Subjective:    Hospital course: 45yo F with open abd wounds and peritonitis. Pt previously lived in [place]. Her sister drove here here and admitted her to the hospital. 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 Pt found to have peritonitis and had fecal contents coming out of her abd wound. She also had an ischemic R foot and had an urgent R EIA/CFA thrombectomy and stent to EIA.    Patient is refusing CT abdomen/pelvis.  Erica Harding accepted her but she states her sister hasn't decided on what facility she will agree to go to.  She is on TPN.  Denies pain.  Podiatry consult pending to consider transmetatarsal amputation.  Not short of breath.  Reviewed interval ancillary notes    Objective:  Visit Vitals   ??? BP 126/87   ??? Pulse 86   ??? Temp 97.3 ??F (36.3 ??C) (Axillary)   ??? Resp 14   ??? Ht 5\' 2"  (1.575 m)   ??? Wt 112 lb 4.8 oz (50.9 kg)   ??? SpO2 100%   ??? BMI 20.54 kg/m2       Intake/Output Summary (Last 24 hours) at 07/03/15 1138  Last data filed at 07/03/15 0654   Gross per 24 hour   Intake          3897.33 ml   Output             1705 ml   Net          2192.33 ml    Wt Readings from Last 3 Encounters:   07/03/15 112 lb 4.8 oz (50.9 kg)       General appearance:  Appears comfortable, no distress  Head:  Atraumatic normocephalic  Neck: supple  Cardiovascular: Regular rhythm, normal S1, S2. No murmur. Tr edema in lower extremities  Respiratory: Not using accessory muscles. Good inspiratory effort. Clear to auscultation bilaterally, no wheeze or crackles.   GI: Abdomen soft, dressing to abdomen, ostomy present Musculoskeletal: strength symmetric  Neurology: awake and alert, cooperative      Labs and Tests:  CBC:   Recent Labs      07/01/15   0445   07/01/15   1925  07/02/15   0815  07/03/15   0603   WBC  7.4   --   7.9  7.3   HGB  6.1*  10.7*  11.4*  11.7*   PLT  57*   --   45*  53*     BMP:  Recent Labs      07/02/15   0110  07/02/15   0815  07/03/15   0603   NA  138  137  133*   K  3.2*  4.4  4.0   CL  111*  109  107   CO2  15*  14*  16*   BUN  24*  22*  22*   CREATININE  0.6  0.6  0.6   GLUCOSE  156*  139*  107*     Hepatic: Recent Labs      07/01/15   0840  07/01/15   1925  07/02/15   0110   AST  25  25  22   ALT  38  36  34   BILITOT  0.3  0.5  0.4   ALKPHOS  70  66  63       Assessment & Plan:   Active Problems:    AKI (acute kidney injury) ([place])    Hyperkalemia    Open wnd ant abdomen-comp    Peritonitis and retroperitoneal infections (H[facility])    Fistula    Ischemic foot    Cachexia ([facility])    Postprocedural intra-abdominal sepsis ([facility])    Abscess    Dehydration    Encephalopathies    Gangrene of foot ([place])     1. Severe sepsis due to peritonitis - multiple open abd wounds - on IVA bx - no plans for surgery at present - on zosyn and flagyl and bowel rest -refused repeat CT abd pelvis soon to assess size of abscesses after draininage.  Continue TPN  2. R L ischemia/gangrene - s.o thrombectomy on 3/16 - pt will likely lose toes on R foot - has palpable pulses now on R foot.  Awaiting Podiatry consult to consider transmetatarsal amputation  3. Acute resp failure - pt extubated on 3/17 - controlled  4. Severe prot cal malnutrition - BMI only 17.9 - pt at risk for losing a lot more weight on TPN  5. PVD - has stent and plavix at present    Disp:  Erica Harding has accepted but patient unsure if she is willing to go .    Diet: Diet NPO Effective Now  PN-Adult Premix 5/20 - Standard Electrolytes - Central Line  Code:Full Code      Erica Scotland, MD   07/03/2015 11:38 AM

## 2015-07-03 NOTE — Consults (Signed)
Inpatient consult to Podiatry  Consult performed by: Wende Bushy  Consult ordered by: Ulyses Jarred        Department of Podiatric Surgery  Consult        History Obtained From:  patient, electronic medical record    HISTORY OF PRESENT ILLNESS:      The patient is a 45 y.o. female with significant past medical history of listed below who was admitted for sepsis, multiple intestinal fistulas and a pelvic abscess she had complete occlusion of the right external iliac and proximal common femoral artery and is now s/p thrombectomy. She has vascular injury to the distal right foot. She admits to foot pain and abdominal pain.     Past Medical History:        Diagnosis Date   ??? Colorectal cancer [facility])      Past Surgical History:        Procedure Laterality Date   ??? Colonoscopy     ??? Abdomen surgery     ??? Hysterectomy       Immunizations:              Tetanus:  up to date                Current Facility-Administered Medications:   ???  PN-Adult Premix 5/20 - Standard Electrolytes - Central Line, , Intravenous, Continuous TPN, Elly Modena, MD  ???  piperacillin-tazobactam (ZOSYN) 3.375 g in dextrose 50 mL IVPB extended infusion (premix), 3.375 g, Intravenous, Q8H, Sheryn Bison, MD, Stopped at 07/03/15 1704  ???  PN-Adult Premix 5/20 - Standard Electrolytes - Central Line, , Intravenous, Continuous TPN, Elly Modena, MD, Last Rate: 60 mL/hr at 07/02/15 1831  ???  LORazepam (ATIVAN) injection 1 mg, 1 mg, Intravenous, Q4H PRN, Sheryn Bison, MD, 1 mg at 07/02/15 1830  ???  fat emulsion 20 % infusion 250 mL, 250 mL, Intravenous, Once per day on Mon Thu, Elly Modena, MD, Stopped at 07/02/15 [phone]  ???  ipratropium-albuterol (DUONEB) nebulizer solution 1 ampule, 1 ampule, Inhalation, TID, Agapito Games, MD, 1 ampule at 07/03/15 1419  ???  ipratropium-albuterol (DUONEB) nebulizer solution 1 ampule, 1 ampule, Inhalation, Q4H PRN, Agapito Games, MD  ???  influenza quadrivalent split vaccine  (FLUZONE;FLUARIX) injection 0.5 mL, 0.5 mL, Intramuscular, Once, Maureen Ralphs, MD  ???  aspirin chewable tablet 81 mg, 81 mg, Per NG tube, Daily, Neldon Mc, MD, Stopped at 06/28/15 1659  ???  clopidogrel (PLAVIX) tablet 75 mg, 75 mg, Per NG tube, Daily, Neldon Mc, MD, Stopped at 06/28/15 1659  ???  acetaminophen (OFIRMEV) infusion 623 mg, 15 mg/kg, Intravenous, Q6H PRN, Maureen Ralphs, MD, Last Rate: 400 mL/hr at 07/03/15 1029, 623 mg at 07/03/15 1029  ???  magnesium hydroxide (MILK OF MAGNESIA) 400 MG/5ML suspension 30 mL, 30 mL, Oral, Daily PRN, Domenick Bookbinder II, MD  ???  glucose (GLUTOSE) 40 % oral gel 15 g, 15 g, Oral, PRN, Siddharth K Mushrif, MD  ???  dextrose 50 % solution 12.5 g, 12.5 g, Intravenous, PRN, Siddharth K Mushrif, MD, 12.5 g at 06/26/15 0010  ???  glucagon (rDNA) injection 1 mg, 1 mg, Intramuscular, PRN, Siddharth K Mushrif, MD  ???  dextrose 5 % solution, 100 mL/hr, Intravenous, PRN, Leroy Libman, MD  ???  insulin lispro (HUMALOG) injection pen 0-6 Units, 0-6 Units, Subcutaneous, Q4H, Elly Modena, MD, Stopped at 07/03/15 1034  ???  sodium chloride flush 0.9 %  injection 10 mL, 10 mL, Intravenous, 2 times per day, Brigitte Pulse, MD, 10 mL at 07/02/15 2049  ???  potassium chloride 10 mEq/100 mL IVPB (Peripheral Line), 10 mEq, Intravenous, PRN, Brigitte Pulse, MD  ???  potassium chloride 20 mEq/50 mL IVPB (Central Line), 20 mEq, Intravenous, PRN, Brigitte Pulse, MD, Last Rate: 50 mL/hr at 07/02/15 0421, 20 mEq at 07/02/15 0421  ???  magnesium sulfate 1 g in dextrose 5% 100 mL IVPB premix, 1 g, Intravenous, PRN, Brigitte Pulse, MD  ???  acetaminophen (TYLENOL) tablet 650 mg, 650 mg, Oral, Q4H PRN, Brigitte Pulse, MD  ???  ondansetron (ZOFRAN) injection 4 mg, 4 mg, Intravenous, Q6H PRN, Brigitte Pulse, MD  ???  famotidine (PEPCID) injection 20 mg, 20 mg, Intravenous, Daily, Brigitte Pulse, MD, 20 mg at 07/03/15 0851  ???  fluconazole (DIFLUCAN) 200 mg in 0.9 %  NaCl 100 mL premix IVPB, 200 mg, Intravenous, Q24H, Brigitte Pulse, MD, Last Rate: 100 mL/hr at 07/03/15 1203, 200 mg at 07/03/15 1203  ???  sodium chloride flush 0.9 % injection 10 mL, 10 mL, Intravenous, 2 times per day, Delorse Limber, APRN, 10 mL at 07/02/15 2041  ???  vancomycin (VANCOCIN) 500 mg in dextrose 5% 100 mL IVPB, 500 mg, Intravenous, Q24H, Brigitte Pulse, MD, Stopped at 07/03/15 1035  ???  fentaNYL (DURAGESIC) 25 MCG/HR 1 patch, 1 patch, Transdermal, Q72H, Brigitte Pulse, MD, 1 patch at 07/01/15 2131  ???  HYDROmorphone (DILAUDID) injection 0.5 mg, 0.5 mg, Intravenous, Q2H PRN, 0.5 mg at 06/29/15 2024 **OR** HYDROmorphone (DILAUDID) injection 1 mg, 1 mg, Intravenous, Q2H PRN, Brigitte Pulse, MD, 1 mg at 07/03/15 1507    Allergies:  Latex and Compazine [prochlorperazine maleate]    Social History     Social History   ??? Marital status: Single     Spouse name: N/A   ??? Number of children: N/A   ??? Years of education: N/A     Occupational History   ??? Not on file.     Social History Main Topics   ??? Smoking status: Never Smoker   ??? Smokeless tobacco: Not on file   ??? Alcohol use No   ??? Drug use: No   ??? Sexual activity: Not on file     Other Topics Concern   ??? Not on file     Social History Narrative     History reviewed. No pertinent family history.  REVIEW OF SYSTEMS:  Negative other than mentioned in the hpi      CBC with Differential:    Lab Results   Component Value Date    WBC 7.3 07/03/2015    RBC 4.06 07/03/2015    HGB 11.7 07/03/2015    HCT 35.2 07/03/2015    PLT 53 07/03/2015    MCV 86.8 07/03/2015    MCH 28.8 07/03/2015    MCHC 33.2 07/03/2015    RDW 17.3 07/03/2015    NRBC 1 06/27/2015    BANDSPCT 20 07/03/2015    BLASTSPCT 1 07/03/2015    METASPCT 5 07/02/2015    LYMPHOPCT 30.0 07/03/2015    MONOPCT 3.0 07/03/2015    MYELOPCT 1 06/26/2015    BASOPCT 0.0 07/03/2015    MONOSABS 0.2 07/03/2015    LYMPHSABS 2.3 07/03/2015    EOSABS 0.1 07/03/2015    BASOSABS 0.0 07/03/2015     BMP:    Lab Results    Component Value Date    NA 133 07/03/2015    K 4.0 07/03/2015  CL 107 07/03/2015    CO2 16 07/03/2015    BUN 22 07/03/2015    LABALBU 1.2 07/02/2015    CREATININE 0.6 07/03/2015    CALCIUM 7.4 07/03/2015    GFRAA >60 07/03/2015    LABGLOM >60 07/03/2015    GLUCOSE 107 07/03/2015       PHYSICAL EXAM:  VITALS:    Visit Vitals   ??? BP 109/63   ??? Pulse 77   ??? Temp 96.8 ??F (36 ??C)   ??? Resp 16   ??? Ht 5\' 2"  (1.575 m)   ??? Wt 112 lb 4.8 oz (50.9 kg)   ??? SpO2 100%   ??? BMI 20.54 kg/m2     CONSTITUTIONAL:  awake, alert, cooperative, no apparent distress, and appears stated age      LOWER EXTREMITY:  Vascular: Vascular status Intact  palpable pedal pulses, right DP1/4 and PT2/4, left DP1/4 and PT1/4.   Hair growthAbsent  both lower extremities and feet.  Skin temperature is warm to warm from pretibial area to distal digits bilateral.  Exam is negative for rubor, pallor, cyanosis or signs of acute vascular compromise bilaterally.  Exam is positive for edema bilateral lower extremity.  Varicosities Absent  bilateral lower extremity.   Neuro: Neurologic status normal bilateral with epicritic Present , proprioceptive Present, vibratory sensationPresent and protopathic Present.  DTR???s Present bilateral Achilles. There were no reproducible neuritic symptoms on exam bilateral feet/ankles.  Derm: Distal necrosis digit 1-5 foot dusky changes to the digits and 1-2 cm onto the foot.   Ecchymosis Absent  bilateral feet/foot.    Musculoskeletal: 5/5 muscle strength in/eversion and dorsi/plantarflexion bilateral feet.  No gross instability noted.        ASSESSMENT AND PLAN:  -Necrosis right distal foot following thrombus with thrombectomy   - Digits still demarcating   - She will need amputation in the future after demarcation complete then salvage level can be determined  -Betadine daily  -okay for d/c from podiatric perspective with op f/u at wound center      DISPO: Will follow in house, f/u wound care on d/c.  paint with betadine until  fully demarcated then will need amputation, if site becomes infected she will need a more urgent amputation     Thanks for the opportunity to participate in this patient's care.     Wende Bushy DPM   Cell # [phone]

## 2015-07-03 NOTE — Progress Notes (Signed)
Partial assessment complete d/t patient did not allow this nurse to look at abdomen or change dressings at this time.  PRN pain medication given at 1934, patient states she still hurts, she had fallen asleep after medication given and is hurting again.  Dilaudid not due at this time, ordered every 2 hours.  PRN IV tylenol to be given, awaiting pharmacy to send to unit.  Requested and provided warm blanket, ice chips.  No further needs, callbell in reach. Gala Murdoch, RN

## 2015-07-03 NOTE — Plan of Care (Signed)
Problem: Wound:  Intervention: Assess pain status  C/O abdominal pain 8 out of 10, PRN dilaudid given, see MAR.  Declines repositioning at this time

## 2015-07-04 ENCOUNTER — Inpatient Hospital Stay: Admit: 2015-07-05 | Payer: Medicare (Managed Care)

## 2015-07-04 ENCOUNTER — Encounter: Primary: Internal Medicine

## 2015-07-04 LAB — CBC WITH AUTO DIFFERENTIAL
Atypical Lymphocytes Relative: 3 % (ref 0–6)
Bands Relative: 34 % — ABNORMAL HIGH (ref 0–7)
Basophils %: 0 %
Basophils Absolute: 0 10*3/uL (ref 0.0–0.2)
Eosinophils %: 0 %
Eosinophils Absolute: 0 10*3/uL (ref 0.0–0.6)
Hematocrit: 30.3 % — ABNORMAL LOW (ref 36.0–48.0)
Hemoglobin: 10 g/dL — ABNORMAL LOW (ref 12.0–16.0)
Lymphocytes %: 23 %
Lymphocytes Absolute: 2.4 10*3/uL (ref 1.0–5.1)
MCH: 28.9 pg (ref 26.0–34.0)
MCHC: 32.8 g/dL (ref 31.0–36.0)
MCV: 88 fL (ref 80.0–100.0)
MPV: 9.9 fL (ref 5.0–10.5)
Monocytes %: 3 %
Monocytes Absolute: 0.3 10*3/uL (ref 0.0–1.3)
Neutrophils %: 37 %
Neutrophils Absolute: 6.6 10*3/uL (ref 1.7–7.7)
PLATELET SLIDE REVIEW: DECREASED
Platelets: 63 10*3/uL — ABNORMAL LOW (ref 135–450)
RBC: 3.45 M/uL — ABNORMAL LOW (ref 4.00–5.20)
RDW: 17.3 % — ABNORMAL HIGH (ref 12.4–15.4)
WBC: 9.3 10*3/uL (ref 4.0–11.0)

## 2015-07-04 LAB — BASIC METABOLIC PANEL
Anion Gap: 12 (ref 3–16)
BUN: 22 mg/dL — ABNORMAL HIGH (ref 7–20)
CO2: 17 mmol/L — ABNORMAL LOW (ref 21–32)
Calcium: 7.2 mg/dL — ABNORMAL LOW (ref 8.3–10.6)
Chloride: 106 mmol/L (ref 99–110)
Creatinine: 0.5 mg/dL — ABNORMAL LOW (ref 0.6–1.1)
GFR African American: >60 (ref >60)
GFR Non-African American: >60 (ref >60)
Glucose: 124 mg/dL — ABNORMAL HIGH (ref 70–99)
Potassium: 3.4 mmol/L — ABNORMAL LOW (ref 3.5–5.1)
Sodium: 135 mmol/L — ABNORMAL LOW (ref 136–145)

## 2015-07-04 LAB — POCT GLUCOSE
POC Glucose: 132 mg/dl — ABNORMAL HIGH (ref 70–99)
POC Glucose: 118 mg/dl — ABNORMAL HIGH (ref 70–99)
POC Glucose: 129 mg/dl — ABNORMAL HIGH (ref 70–99)
POC Glucose: 120 mg/dl — ABNORMAL HIGH (ref 70–99)
POC Glucose: 147 mg/dl — ABNORMAL HIGH (ref 70–99)
POC Glucose: 111 mg/dl — ABNORMAL HIGH (ref 70–99)

## 2015-07-04 LAB — VANCOMYCIN LEVEL, TROUGH: Vancomycin Tr: 10.4 ug/mL (ref 10.0–20.0)

## 2015-07-04 LAB — PHOSPHORUS: Phosphorus: 2.5 mg/dL (ref 2.5–4.9)

## 2015-07-04 LAB — MAGNESIUM: Magnesium: 2.2 mg/dL (ref 1.80–2.40)

## 2015-07-04 LAB — POTASSIUM: Potassium: 4.3 mmol/L (ref 3.5–5.1)

## 2015-07-04 MED ORDER — VANCOMYCIN HCL IN DEXTROSE 500-5 MG/100ML-% IV SOLN
500-5 MG/100ML-% | INTRAVENOUS | 0 refills | Status: DC
Start: 2015-07-04 — End: 2015-07-08

## 2015-07-04 MED ORDER — IPRATROPIUM-ALBUTEROL 0.5-2.5 (3) MG/3ML IN SOLN
RESPIRATORY_TRACT | 0 refills | Status: AC | PRN
Start: 2015-07-04 — End: ?

## 2015-07-04 MED ORDER — POTASSIUM CHLORIDE 10 MEQ/100ML IV SOLN
10 MEQ/0ML | INTRAVENOUS | Status: AC
Start: 2015-07-04 — End: 2015-07-04
  Administered 2015-07-04 (×4): 10 meq via INTRAVENOUS

## 2015-07-04 MED ORDER — GLUCOSE 40 % PO GEL
40 | ORAL | 1 refills | Status: AC | PRN
Start: 2015-07-04 — End: ?

## 2015-07-04 MED ORDER — LORAZEPAM 2 MG/ML IJ SOLN
2 MG/ML | INTRAMUSCULAR | 0 refills | Status: AC | PRN
Start: 2015-07-04 — End: ?

## 2015-07-04 MED ORDER — FAT EMULSION 20 % IV EMUL
20 % | INTRAVENOUS | 0 refills | Status: AC
Start: 2015-07-04 — End: ?

## 2015-07-04 MED ORDER — HYDROMORPHONE HCL 1 MG/ML IJ SOLN
1 MG/ML | INTRAMUSCULAR | 0 refills | Status: AC | PRN
Start: 2015-07-04 — End: ?

## 2015-07-04 MED ORDER — FAMOTIDINE 20 MG/2ML IV SOLN
20 MG/2ML | Freq: Every day | INTRAVENOUS | 0 refills | Status: AC
Start: 2015-07-04 — End: ?

## 2015-07-04 MED ORDER — MAGNESIUM SULFATE IN D5W 10-5 MG/ML-% IV SOLN
10-5 | INTRAVENOUS | 0 refills | 22.00000 days | Status: AC | PRN
Start: 2015-07-04 — End: ?

## 2015-07-04 MED ORDER — ASPIRIN 81 MG PO CHEW
81 MG | ORAL_TABLET | Freq: Every day | ORAL | 3 refills | Status: AC
Start: 2015-07-04 — End: ?

## 2015-07-04 MED ORDER — ONDANSETRON HCL 4 MG/2ML IJ SOLN
4 MG/2ML | Freq: Four times a day (QID) | INTRAMUSCULAR | 0 refills | Status: AC | PRN
Start: 2015-07-04 — End: ?

## 2015-07-04 MED ORDER — INSULIN LISPRO 100 UNIT/ML SC SOPN
100 UNIT/ML | PEN_INJECTOR | SUBCUTANEOUS | 3 refills | Status: AC
Start: 2015-07-04 — End: ?

## 2015-07-04 MED ORDER — FENTANYL 25 MCG/HR TD PT72
25 MCG/HR | MEDICATED_PATCH | TRANSDERMAL | 0 refills | Status: AC
Start: 2015-07-04 — End: 2015-08-03

## 2015-07-04 MED ORDER — NORMAL SALINE FLUSH 0.9 % IV SOLN
0.9 % | INTRAVENOUS | Status: AC
Start: 2015-07-04 — End: 2015-07-04
  Administered 2015-07-04: 10:00:00 10

## 2015-07-04 MED ORDER — FLUCONAZOLE IN SODIUM CHLORIDE 200-0.9 MG/100ML-% IV SOLN
INTRAVENOUS | 0 refills | Status: AC
Start: 2015-07-04 — End: ?

## 2015-07-04 MED ORDER — ACETAMINOPHEN 10 MG/ML IV SOLN
10 MG/ML | Freq: Four times a day (QID) | INTRAVENOUS | 0 refills | Status: AC | PRN
Start: 2015-07-04 — End: ?

## 2015-07-04 MED ORDER — CLINIMIX E/DEXTROSE (5/20) 5 % IV SOLN
5 % | INTRAVENOUS | Status: DC
Start: 2015-07-04 — End: 2015-07-04

## 2015-07-04 MED ORDER — POTASSIUM CHLORIDE 20 MEQ/50ML IV SOLN
20 | INTRAVENOUS | 0 refills | Status: AC | PRN
Start: 2015-07-04 — End: ?

## 2015-07-04 MED ORDER — NORMAL SALINE FLUSH 0.9 % IV SOLN
0.9 % | INTRAVENOUS | Status: AC
Start: 2015-07-04 — End: 2015-07-04
  Administered 2015-07-04: 22:00:00 10

## 2015-07-04 MED ORDER — CLOPIDOGREL BISULFATE 75 MG PO TABS
75 MG | ORAL_TABLET | Freq: Every day | ORAL | 3 refills | Status: AC
Start: 2015-07-04 — End: ?

## 2015-07-04 MED ORDER — NORMAL SALINE FLUSH 0.9 % IV SOLN
0.9 % | INTRAVENOUS | Status: DC
Start: 2015-07-04 — End: 2015-07-04

## 2015-07-04 MED ORDER — DEXTROSE 5 % IV SOLN
5 % | INTRAVENOUS | Status: DC
Start: 2015-07-04 — End: 2015-07-04

## 2015-07-04 MED ORDER — HYDROmorphone (DILAUDID) 1 mg/mL injection Syrg
1 | INTRAMUSCULAR | Status: AC
Start: 2015-07-04 — End: 2015-07-05
  Administered 2015-07-05: 04:00:00 1

## 2015-07-04 MED ORDER — fentaNYL (DURAGESIC) 25 mcg/hr 1 patch
25 | TRANSDERMAL | Status: AC
Start: 2015-07-04 — End: 2015-07-05

## 2015-07-04 MED ORDER — LORazepam (ATIVAN) 2 mg/mL injection
2 | INTRAMUSCULAR | Status: AC | PRN
Start: 2015-07-04 — End: 2015-07-05
  Administered 2015-07-05: 14:00:00 0.5 mg via INTRAVENOUS

## 2015-07-04 MED ORDER — ondansetron (ZOFRAN) 4 mg/2 mL injection 4 mg
4 | INTRAMUSCULAR | Status: AC | PRN
Start: 2015-07-04 — End: 2015-07-05

## 2015-07-04 MED ORDER — clopidogrel (PLAVIX) tablet 75 mg
75 | Freq: Every day | ORAL | Status: AC
Start: 2015-07-04 — End: 2015-07-05

## 2015-07-04 MED ORDER — acetaminophen Soln 650 mg
650 | Freq: Four times a day (QID) | ORAL | Status: AC | PRN
Start: 2015-07-04 — End: 2015-07-05

## 2015-07-04 MED ORDER — vancomycin (VANCOCIN) 750 mg in sodium chloride 0.9 % 150 mL IVPB
INTRAVENOUS | Status: AC
Start: 2015-07-04 — End: 2015-07-05
  Administered 2015-07-05: 15:00:00 750 mg via INTRAVENOUS

## 2015-07-04 MED ORDER — albuterol (PROVENTIL) nebulizer solution 2.5 mg
2.5 | RESPIRATORY_TRACT | Status: AC | PRN
Start: 2015-07-04 — End: 2015-07-05

## 2015-07-04 MED ORDER — fluconazole (DIFLUCAN) 200 mg IV in NaCl (iso-osm) 100 mL
200 | INTRAVENOUS | Status: AC
Start: 2015-07-04 — End: 2015-07-05
  Administered 2015-07-05: 05:00:00 200 mg via INTRAVENOUS

## 2015-07-04 MED ORDER — piperacillin-tazobactam (ZOSYN) 3.375 g in sodium chloride 0.9% 100 mL ADV IVPB
Freq: Three times a day (TID) | INTRAVENOUS | Status: AC
Start: 2015-07-04 — End: 2015-07-05
  Administered 2015-07-05 (×3): 3.375 g via INTRAVENOUS

## 2015-07-04 MED ORDER — aspirin chewable tablet 81 mg
81 | Freq: Every day | ORAL | Status: AC
Start: 2015-07-04 — End: 2015-07-05

## 2015-07-04 MED ORDER — famotidine (PF) (PEPCID) injection 20 mg
20 | Freq: Two times a day (BID) | INTRAVENOUS | Status: AC
Start: 2015-07-04 — End: 2015-07-05
  Administered 2015-07-05: 13:00:00 20 mg via INTRAVENOUS

## 2015-07-04 MED ORDER — HYDROmorphone (PF) (DIALUDID) 4 mg/mL Syrg 1 mg
4 | INTRAMUSCULAR | Status: AC | PRN
Start: 2015-07-04 — End: 2015-07-05
  Administered 2015-07-05: 02:00:00 1 mg via INTRAVENOUS

## 2015-07-04 MED FILL — DILAUDID (PF) 4 MG/ML INJECTION SYRINGE: 4 4 mg/mL | INTRAMUSCULAR | Qty: 1

## 2015-07-04 MED FILL — HYDROMORPHONE 1 MG/ML INJECTION SYRINGE: 1 1 mg/mL | INTRAMUSCULAR | Qty: 1

## 2015-07-04 MED FILL — FAMOTIDINE (PF) 20 MG/2 ML INTRAVENOUS SOLUTION: 20 20 mg/2 mL | INTRAVENOUS | Qty: 2

## 2015-07-04 MED FILL — FLUCONAZOLE 200 MG/100 ML IN SOD. CHLORIDE (ISO) INTRAVENOUS PIGGYBACK: 200 200 mg/100 mL | INTRAVENOUS | Qty: 100

## 2015-07-04 MED FILL — NORMAL SALINE FLUSH 0.9 % IV SOLN: 0.9 % | INTRAVENOUS | Qty: 30

## 2015-07-04 MED FILL — HYDROMORPHONE HCL 1 MG/ML IJ SOLN: 1 MG/ML | INTRAMUSCULAR | Qty: 1

## 2015-07-04 MED FILL — POTASSIUM CHLORIDE 10 MEQ/100ML IV SOLN: 10 MEQ/0ML | INTRAVENOUS | Qty: 100

## 2015-07-04 MED FILL — NORMAL SALINE FLUSH 0.9 % IV SOLN: 0.9 % | INTRAVENOUS | Qty: 10

## 2015-07-04 MED FILL — NORMAL SALINE FLUSH 0.9 % IV SOLN: 0.9 % | INTRAVENOUS | Qty: 20

## 2015-07-04 MED FILL — FENTANYL 25 MCG/HR TD PT72: 25 MCG/HR | TRANSDERMAL | Qty: 1

## 2015-07-04 MED FILL — FLUARIX QUADRIVALENT 0.5 ML IM SUSY: 0.5 ML | INTRAMUSCULAR | Qty: 0.5

## 2015-07-04 MED FILL — PLAVIX 75 MG PO TABS: 75 MG | ORAL | Qty: 1

## 2015-07-04 MED FILL — VANCOMYCIN HCL IN DEXTROSE 500-5 MG/100ML-% IV SOLN: 500-5 MG/100ML-% | INTRAVENOUS | Qty: 100

## 2015-07-04 MED FILL — ZOSYN 3-0.375 GM/50ML IV SOLN: INTRAVENOUS | Qty: 50

## 2015-07-04 MED FILL — ASPIRIN 81 MG PO CHEW: 81 MG | ORAL | Qty: 1

## 2015-07-04 MED FILL — CLINIMIX E/DEXTROSE (5/20) 5 % IV SOLN: 5 % | INTRAVENOUS | Qty: 2000

## 2015-07-04 MED FILL — FLUCONAZOLE IN SODIUM CHLORIDE 200-0.9 MG/100ML-% IV SOLN: INTRAVENOUS | Qty: 100

## 2015-07-04 MED FILL — VANCOMYCIN HCL 1000 MG IV SOLR: 1000 MG | INTRAVENOUS | Qty: 750

## 2015-07-04 MED FILL — LORAZEPAM 2 MG/ML IJ SOLN: 2 MG/ML | INTRAMUSCULAR | Qty: 1

## 2015-07-04 MED FILL — IPRATROPIUM-ALBUTEROL 0.5-2.5 (3) MG/3ML IN SOLN: RESPIRATORY_TRACT | Qty: 3

## 2015-07-04 MED FILL — INTRALIPID 20 % IV EMUL: 20 % | INTRAVENOUS | Qty: 250

## 2015-07-04 MED FILL — OFIRMEV 10 MG/ML IV SOLN: 10 MG/ML | INTRAVENOUS | Qty: 100

## 2015-07-04 MED FILL — FAMOTIDINE 20 MG/2ML IV SOLN: 20 MG/2ML | INTRAVENOUS | Qty: 2

## 2015-07-04 NOTE — Unmapped (Signed)
[place]  DEPARTMENT OF INTERNAL MEDICINE  HISTORY & PHYSICAL  Patient: Linda Ponce  MRN: [id number]  CSN: [id number]    Chief Complaint     Peritoneal abscess    History of Present Illness     HPI 45 year old AA female with history of perforated colorectal adenocarcinoma in 2004 s/p radiation and pelvic exenteration, with recurrent pre-sacral peritoneal abscesses. She was treated in [place] in February 2017 for an acute abdominal abscess and discharged in improved condition, only to be readmitted there for increase in complex abdominal abscess and again here in [place] where she presented in florid sepsis. Treated in the ICU with broad spectrum antibiotics, as she had a feculent peritonitis, and pigtail drain placement LLQ. Records are scanty but at some point she developed limb ischemia with necrotic/gangrenous toes and forefoot on the right. She continues to require IV antibiotics and dilaudid, as her pain was poorly controlled and she is NPO. The majority of her pain is in her legs and her fistulous wound during care. She also requires TPN/nutritional support, PT/OT, and wound care.    Review of Systems     Review of Systems Complains of unbearable pain of legs and feet, otherwise negative    Past Medical History   ?? Colorectal cancer 2004 s/p pelvic exenteration, perforated adenocarcinoma  ?? Fecal peritonitis with abscess  ?? Encephalopathy  ?? Sepsis  ?? Bilateral ischemic lower extremities, arterial thromboemboli  ?? Abdominal fistula/enterocutaneous fistula, rectovaginal fistulae  ?? Chronic nephrostomies  ?? Cachexia  ?? Chronic pain  ?? Recurrent pre-sacral abscesses  ?? Acute metabolic acidosis  ?? Acute renal failure    Past Surgical History     ?? Laparotomy  ?? Hysterectomy  ?? Nephrostomies  ?? Incision and drainage of multiple prior complex abscesses    Family History     Unavailable and noncontributory    Social History     Negative for alcohol, tobacco, illicit drugs. Lives in [place] but here  for her sister to care for her    Medications     Allergies:  Allergies   Allergen Reactions   ??? Compazine [Prochlorperazine Edisylate]    ??? Latex, Natural Rubber        Inpatient Meds:  Scheduled:  ??? HYDROmorphone       ??? [START ON 07/05/2015] aspirin  81 mg Oral Daily 0900   ??? [START ON 07/05/2015] clopidogrel  75 mg Oral Daily 0900   ??? famotidine (PF)  20 mg Intravenous BID   ??? [START ON 07/07/2015] fentaNYL  1 patch Transdermal Q72H   ??? fluconazole in NaCl (iso-osm)  200 mg Intravenous Q24H   ??? piperacillin-tazobactam (ZOSYN) IV extended interval  3.375 g Intravenous 3 times per day   ??? [START ON 07/05/2015] vancomycin  750 mg Intravenous Q24H     Continuous:     NWG:[id number], albuterol, HYDROmorphone (PF), LORazepam, ondansetron    Vital Signs (from today at [facility])     98/56, 112, 16, 97.1 degrees. 90lbs (40.8 kg), height 62 inches- BMI 16.5    Physical Exam     Physical Exam  GENERAL: Cachectic AA female in acute distress from severe pain, diaphoretic, grimacing, awake, alert,?? not in acute cardiorespiratory distress. Entirely oriented  HEENT:Atraumatic.Normocephalic. PERRLA, EOMI. No icterus, no pallor, no jugular venous distention.  NECK: Supple. Trachea is in midline.?? No bruits, no lymphadenopathy  CHEST: Tunneled internal jugular catheter right side. PAC right chest. Symmetric. Clear to auscultation, bilateral equal  air entry. No wheezing or rhonchi.  CARDIOVASCULAR: S1, S2, Regular pulse. No murmur or gallops. No JVD.  ABDOMEN: Soft.  Unable to palpate due to multiple wounds etc. LLQ pigtail drain draining serosanguinous fluid. Nephrostomy tubes both flanks draining yellow urine. Foley with scant dark urine. Wound LLQ nondraining. What appears to be ostomy vs wound with appliance in place RLQ. Large midabdominal wound pouch to drainage, two fistulae contained within draining bilious appearing enteric fluid.  EXTREMITIES: generalized edema. Extremities warm. Necrotic, gangrenous toes,  excruciatingly painful. No clubbing. Unable to assess BLE pulses   Laboratory Data     07/04/15- WBC 9.3, Hgb 10. HCT 30.3, Platelets 63  Sodium 135, potassium 3.4, CO2 17, Bun 22, sCr 0.5, glucose 124      Assessment & Plan     ?? Peritoneal abscess- Mixed flora, pigtail drain 06/26/15 Refused followup CT, still requires one. ID consult- Vanco/Zosyn/Diflucan  ?? Fistula- wound care, bowel rest  ?? Severe intractable acute on chronic pain syndrome- receiving Dilaudid IV BID prn  ?? Thrombocytopenia- uncertain etiology- follow closely. Per ID at [facility], 3 is an improvement  ?? Cachexia-   continue TPN, check PAB  ?? Sepsis resolved  ?? Ischemic feet/arterial thromboemboli- currently on aspirin and Plavix. Refused all heparin injections  ?? History of colon cancer s/p exenteration with bilateral nephrostomy tubes  ?? Recurrent presacral abscesses  ?? Debility- PT/OT    Prognosis is poor, sister is Hospice nurse, and yet per [facility], she remains oblivious about her prognosis. See by palliative care previously    Nutrition:   Diet Orders          Diet NPO effective now Except for: except meds and ice Able to chew meds starting at 03/23 2149          Code Status: Full Code    Signed:  Regina Eck, MD  07/04/2015, 9:50 PM

## 2015-07-04 NOTE — Consults (Signed)
Clinical Pharmacy Note    Pharmacy consulted to manage TPN     K 3.4, replace with 27mEq of potassium chloride IV, administered over 4 hours. Continue Clinimix E 5/20 with additional 2 grams of magnesium sulfate at a rate of 62ml/hr. If potassium remains low tomorrow will add additional potassium into tpn.     Thank you for allowing pharmacy to participate in the care of this patient.    Zhi Geier, Pharm.D.  [phone]

## 2015-07-04 NOTE — Plan of Care (Signed)
Problem: Risk for Impaired Skin Integrity  Goal: Tissue integrity - skin and mucous membranes  Structural intactness and normal physiological function of skin and  mucous membranes.   Intervention: SKIN ASSESSMENT  Pt refusing to allow abdomen/dressing changed.  Did allow pillow to be placed partially under left hip.  Reminded patient of importance of turning, still declined.      Problem: Wound:  Intervention: Assess pain status  Patient refusing dressing to abdomen to be changed

## 2015-07-04 NOTE — Plan of Care (Signed)
Problem: PAIN  Goal: Patient???s pain/discomfort is manageable  0845 the patient complains of pain #9. Given iv pain medication per request. Assessment complete, see flow sheet.  Will continue to monitor. Fidela Salisbury RN, BSN, PCCN.

## 2015-07-04 NOTE — Progress Notes (Signed)
RESPIRATORY THERAPY ASSESSMENT    Erica Harding   [id number]  Jul 16, 1970      '@Recent'$  Surgical Procedures none  '@Pulmonary'$  History none  '@Home'$  Respiratory Therapy none  '@Home'$  Oxygen Therapy none  '@Current'$  Respiratory Therapy hhn duoneb tid   Vitals:    07/04/15 0845   BP: 114/77   Pulse: 102   Resp: 18   Temp: 98.6 ??F (37 ??C)   SpO2: 100%       MPVC: none  Actual VC: none  Sputum: Sputum Color: None, Tenacity: None, Sputum How Obtained: Cough on request    ASSESSMENT OF RESPIRATORY SEVERITY INDEX (RSI)    Patients with orders for inhalation medications, oxygen, or any therapeutic treatment modality will be placed on Respiratory Protocol.  They will be assessed with the first treatment and at least every 72 hours thereafter.  The following severity scale will be used to determine frequency of treatment intervention.    Respiratory History: No Smoking History = 0    Recent Surgical History: None = 0    Level of Consciousness: Alert, Oriented, and Cooperative = 0    Level of Activity: Non weight bearing- transfers bed to chair only = 3    Respiratory Pattern: Regular Pattern; RR 8-20 = 0    Breath Sounds: Diminished unilaterally = 1    Cough: Strong, spontaneous, non-productive = 0    SPO2 (COPD values may differ): Greater than or equal to 92% on room air = 0    Peak Flow (asthma only): not applicable = 0    RSI: 0-5 = See once and convert to home regimen or discontinue  PLAN    Goals: volume expansion    Patient educated on: Incentive Spirometry and Deep Breath and Cough     Comment / Outcomes: Tx will be changed to prn and reassesed    Plan of Care: hhn duoneb prn     Is patient being placed on Home Treatment Regimen? No      Respiratory Protocol Guidelines    1. Assessment and treatment by Respiratory Therapy will be initiated for medication and therapeutic interventions upon initiation of aerosolized medication.  2. Physician will be contacted for respiratory rate (RR) greater than 35 breaths per minute. Therapy  will be held for heart rate (HR) greater than 140 beats per minute, pending direction from physician.  3. Bronchodilators will be administered via Metered Dose Inhaler (MDI) or Hydrofluoroalkane (HFA) with spacer when the following criteria are met:  a. Alert and cooperative     b. HR < 140 bpm  c. RR < 30 bpm                d. Can demonstrate a 2???3 second inspiratory hold  4. Bronchodilators will be administered via Hand Held Nebulizer [facility]) to patients when ANY of the following criteria are met  a. Incogizant or uncooperative          b. Patients treated with HHN at Home        c. Unable to demonstrate proper MDI or HFA technique     d. RR > 30 bpm   5. Bronchodilators will be delivered via Metered Dose Inhaler (MDI) or Hydrofluoroalkane (HFA) with spacer to intubated patients on mechanical ventilation.  6. Inhalation medication orders will be delivered and/or substituted as outlined below.    Aerosolized Medications Ordering and Administration Guidelines:    1. All Medications will be ordered by a physician, and their frequency and/or modality will be  adjusted as defined by the patients Respiratory Severity Index (RSI) score.  2. If the patient does not have documented COPD, consider discontinuing anticholinergics when RSI is less than 9.  3. If the bronchospasm worsens (increased RSI), then the bronchodilator frequency can be increased to a maximum of every 4 hours.  If greater than every 4 hours is required, the physician will be contacted.  4. If the bronchospasm improves, the frequency of the bronchodilator can be decreased, based on the patient's RSI, but not less than home treatment regimen frequency.  5. Bronchodilator(s) will be discontinued if patient has a RSI less than 9 and has received no scheduled or as needed treatment for 72  Hrs.    Patients Ordered on a Mucolytic Agent:    1. Must always be administered with a bronchodilator.    2. Discontinue if patient experiences worsened bronchospasm, or  secretions have lessened to the point that the patient is able to clear them with a cough.    Anti-inflammatory and Combination Medications:    1. If the patient lacks prior history of lung disease, is not using inhaled anti-inflammatory medication at home, and lacks wheezing by examination or by history for at least 24 hours, contact physician for possible discontinuation.      Pharmacy formulary interchanges for Respiratory medications can also be found by using the Smart Phrase .[email or web address] in any EPIC comment field.

## 2015-07-04 NOTE — Plan of Care (Signed)
Pt did not get CT yesterday, no further reccs at this time.  Continue current care.  DC planning to LTAC.  Discussed with Dr. Vira Browns

## 2015-07-04 NOTE — Care Coordination-Inpatient (Addendum)
[employer] precert was approved for [employer].  Discharged was delayed to 6:45pm via [employer], [phone].  RN to complete the COC/AVS, and call report to [phone], f- [phone].  TPN orders were faxed and called.  Please send a bag to go with patient.  Thank you !

## 2015-07-04 NOTE — Progress Notes (Signed)
Partial assessment completed and vitals obtained.  Patient continues to refuse dressing to abdomen to be changed.  Did allow this nurse to reposition with pillow support.  No further needs, callbell in reach and bed alarm on. Mila Homer RN

## 2015-07-04 NOTE — Discharge Instructions (Signed)
???   Good nutrition is important when healing from an illness, injury, or surgery.  Follow any nutrition recommendations given to you during your hospital stay.   ??? If you were given an oral nutrition supplement while in the hospital, continue to take this supplement at home.  You can take it with meals, in-between meals, and/or before bedtime. These supplements can be purchased at most local grocery stores, pharmacies, and chain super-stores.   ??? If you have any questions about your diet or nutrition, call the hospital and ask for the dietitian.    NPO.  Continue TPN per pharmacy dosing and Lipids daily.

## 2015-07-04 NOTE — Progress Notes (Signed)
Infectious Disease Follow up Notes    CC :  peritonitis     Antibiotics:   Fluconazole 200 IV q24   Zosyn 3.375 q8  vanc 500 IV q24     Admit Date:   06/25/2015  Hospital Day: 10    Subjective:     Remains afebrile   Was not able to comply with CT yesterday  No new issues identified     Objective:     Patient Vitals for the past 8 hrs:   BP Temp Temp src Pulse Resp SpO2 Weight   07/04/15 0845 114/77 98.6 ??F (37 ??C) Temporal 102 18 100 % -   07/04/15 0835 - - - - - - 109 lb (49.4 kg)   07/04/15 0545 116/79 98 ??F (36.7 ??C) Temporal 102 18 - -   07/04/15 0538 - - - - - 99 % -       EXAM:  Exam unchanged   General:  Resting      LUNGS:  CTA upper lobes   CV:  RRR  ABD: Drain LLQ to LWS  Wound manager with wounds packed  Nephrostomy bil     EXT:  Ischemic 5 toes right foot, feet warm      SKIN: No rash      LINE: R IJ CVL site ok       Scheduled Meds:  ??? piperacillin-tazobactam  3.375 g Intravenous Q8H   ??? aspirin  81 mg Per NG tube Daily   ??? clopidogrel  75 mg Per NG tube Daily   ??? insulin lispro  0-6 Units Subcutaneous Q4H   ??? sodium chloride flush  10 mL Intravenous 2 times per day   ??? famotidine (PEPCID) injection  20 mg Intravenous Daily   ??? fluconazole  200 mg Intravenous Q24H   ??? sodium chloride flush  10 mL Intravenous 2 times per day   ??? vancomycin  500 mg Intravenous Q24H   ??? fentaNYL  1 patch Transdermal Q72H       Continuous Infusions:  ??? PN-Adult Premix 5/20 - Standard Electrolytes - Central Line 60 mL/hr at 07/03/15 1814   ??? fat emulsion Stopped (07/02/15 0811)   ??? dextrose            Data Review:    Lab Results   Component Value Date    WBC 9.3 07/04/2015    HGB 10.0 (L) 07/04/2015    HCT 30.3 (L) 07/04/2015    MCV 88.0 07/04/2015    PLT 63 (L) 07/04/2015     Lab Results   Component Value Date    CREATININE 0.5 (L) 07/04/2015    BUN 22 (H) 07/04/2015    NA 135 (L) 07/04/2015    K 3.4 (L) 07/04/2015    CL 106 07/04/2015    CO2 17  (L) 07/04/2015       Hepatic Function Panel:   Lab Results   Component Value Date    ALKPHOS 63 07/02/2015    ALT 34 07/02/2015    AST 22 07/02/2015    PROT 3.6 07/02/2015    BILITOT 0.4 07/02/2015    LABALBU 1.2 07/02/2015         MICRO:  3/14 BC neg  3/15 Abscess fluid cx Bacteroides, Enterococcus faecalis/faecium amp-R      IMAGING:  Reviewed         Assessment:     Patient Active Problem List    Diagnosis Date Noted   ??? Abscess    ???  Dehydration    ??? Encephalopathies    ??? Gangrene of foot ([place])    ??? Postprocedural intra-abdominal sepsis ([place])    ??? Ischemic foot    ??? Cachexia ([place])    ??? AKI (acute kidney injury) ([place])    ??? Hyperkalemia    ??? Open wnd ant abdomen-comp    ??? Peritonitis and retroperitoneal infections ([place])    ??? Fistula        Hx colon cancer s/p resection, XRT    Feculent peritonitis  S/p IR drain placement 3/15  Mixed flora isolated with amp-R E faecium     Enterocutaneous fistuale    Dry gangrene feet, s/p angio, thrombectomy, PTA  Expected to require TMA at some point     Cachexia     Thrombocytopenia - platelets now rising        Plan:     Continue Zosyn, vanc, fluconazole  Length of therapy to be determined by clinical and radiographic resolution  Would expect several weeks IV abx  Bowel rest, TPN   Consider placing a PICC to replace the IJ     COC completed  OK for DC to LTAC from ID standpoint  Please call with questions           Barry Brunner, MD  Phone: [phone]   Fax : [phone]

## 2015-07-04 NOTE — Progress Notes (Signed)
[facility] PROGRESS NOTE    07/04/2015 10:10 AM        Name: Erica Harding .              Admitted: 06/25/2015  Primary Care Provider: Referring Not In System (Inactive) (Tel: None)      Subjective:    Hospital course: 45yo F with open abd wounds and peritonitis. Pt previously lived in [place]. Her sister drove here here and admitted her to the hospital. 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 Pt found to have peritonitis and had fecal contents coming out of her abd wound. She also had an ischemic R foot and had an urgent R EIA/CFA thrombectomy and stent to EIA.    Patient is refused CT abdomen/pelvis yesterday.  Erica Harding accepted her and we are waiting on Precert.  She is agreeable to Erica Harding.  She is on TPN.  Pain controlled.  Podiatry will follow up as outpatient to reassess transmetatarsal amputation at later dat.  Not short of breath.  Reviewed interval ancillary notes    Objective:  Visit Vitals   ??? BP 114/77   ??? Pulse 102   ??? Temp 98.6 ??F (37 ??C) (Temporal)   ??? Resp 18   ??? Ht 5\' 2"  (1.575 m)   ??? Wt 109 lb (49.4 kg)   ??? SpO2 100%   ??? BMI 19.94 kg/m2       Intake/Output Summary (Last 24 hours) at 07/04/15 1010  Last data filed at 07/04/15 0555   Gross per 24 hour   Intake                0 ml   Output             2090 ml   Net            -2090 ml      Wt Readings from Last 3 Encounters:   07/04/15 109 lb (49.4 kg)       General appearance:  Appears comfortable, no distress  Head:  Atraumatic normocephalic  Neck: supple  Cardiovascular: Regular rhythm, normal S1, S2. No murmur. Tr edema in lower extremities  Respiratory: Not using accessory muscles. Good inspiratory effort. Clear to auscultation bilaterally, no wheeze or crackles.   GI: Abdomen soft, dressing to abdomen, ostomy present Musculoskeletal: strength symmetric  Neurology: awake and alert, cooperative      Labs and Tests:  CBC:   Recent Labs      07/02/15   0815   07/03/15   0603  07/04/15   0650   WBC  7.9  7.3  9.3   HGB  11.4*  11.7*  10.0*   PLT  45*  53*  63*     BMP:    Recent Labs      07/02/15   0815  07/03/15   0603  07/04/15   0650   NA  137  133*  135*   K  4.4  4.0  3.4*   CL  109  107  106   CO2  14*  16*  17*   BUN  22*  22*  22*   CREATININE  0.6  0.6  0.5*   GLUCOSE  139*  107*  124*     Hepatic:   Recent Labs      07/01/15   1925  07/02/15   0110   AST  25  22   ALT  36  34  BILITOT  0.5  0.4   ALKPHOS  66  63       Assessment & Plan:   Active Problems:    AKI (acute kidney injury) ([place])    Hyperkalemia    Open wnd ant abdomen-comp    Peritonitis and retroperitoneal infections ([facility])    Fistula    Ischemic foot    Cachexia ([facility])    Postprocedural intra-abdominal sepsis ([facility])    Abscess    Dehydration    Encephalopathies    Gangrene of foot ([place])     1. Severe sepsis due to peritonitis - multiple open abd wounds - on IV Vanc and zosyn- no plans for surgery at present - on  bowel rest -refused repeat CT abd pelvis to assess size of abscesses after drainage.  Continue TPN and wound care.  2. R L ischemia/gangrene - s.o thrombectomy on 3/16 - pt will likely lose toes on R foot - has palpable pulses now on R foot.  Podiatry will follow as outpatient to  consider transmetatarsal amputation at later date.  3. Acute resp failure - pt extubated on 3/17 - resolved.  4. Severe prot cal malnutrition - BMI only 17.9 - pt at risk for losing a lot more weight on TPN.  5. PVD - has stent and plavix at present    Disp:  Erica Harding has accepted and will discharge today if precert obtained.    Diet: Diet NPO Effective Now  PN-Adult Premix 5/20 - Standard Electrolytes - Central Line  Code:Full Code      Erica Scotland, MD   07/04/2015 10:10 AM

## 2015-07-04 NOTE — Care Coordination-Inpatient (Signed)
Spoke with patient and her sister, April, and they are agreeable to discharge to [facility], when medically ready pending [employer] precert.  MD, Please complete & sign the e-COC under the discharge instructions, AVS/Prescriptions, and or medical necessity note for assistance with discharge planning, Thank you, Sofie Hartigan, MSW, [phone]

## 2015-07-04 NOTE — Progress Notes (Signed)
Assessment/vitals obtained, nephrostomies emptied.  Patient continues to refuse abdominal dressings to be changed.  Discussed importance of dressing changes to reduce infection, patient still declines.  Requested and given PRN dilaudid. Refused repositioning, states she was nervous and just wanted to be left alone.  Callbell in reach and bed alarm on. Gala Murdoch, RN

## 2015-07-04 NOTE — Discharge Summary (Signed)
[facility] DISCHARGE SUMMARY    Patient Demographics    Patient. Erica Harding  Date of Birth. 08/30/1970  MRN. [id number]     Primary care provider. Referring Not In System (Inactive)  (Tel: None)    Admit date: 06/25/2015    Discharge date (blank if same as Note Date):   Note Date: 07/04/2015     Reason for Hospitalization.   Chief Complaint   Patient presents with   ??? 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 Since arriving to [place] on 06/15/15 patient hasn't been eating, has had increased confusion, and weakness.            Active Problems:    AKI (acute kidney injury) ([place])    Hyperkalemia    Open wnd ant abdomen-comp    Peritonitis and retroperitoneal infections (HCC)    Fistula    Ischemic foot    Cachexia (HCC)    Postprocedural intra-abdominal sepsis (HCC)    Abscess    Dehydration    Encephalopathies    Gangrene of foot ([place])       Problems and results from this hospitalization that need follow up.  1. Severe sepsis due to peritonitis - multiple open abd wounds - on IV Vanc and zosyn- no plans for surgery at present - on bowel rest -refused repeat CT abd pelvis to assess size of abscesses after drainage. Continue TPN and wound care.  2. R L ischemia/gangrene - s.o thrombectomy on 3/16 - pt will likely lose toes on R foot - has palpable pulses now on R foot. Podiatry will follow as outpatient to  consider transmetatarsal amputation at later date.  3. Acute resp failure - pt extubated on 3/17 - resolved.  4. Severe prot cal malnutrition - BMI only 17.9 - pt at risk for losing a lot more weight on TPN.  5. PVD - S/P stent and continue plavix at present      Invasive procedures and treatments.   1.  Procedure: 1. 3rd order RLE angiogram 2. Right EIA/CFA thrombectomy (Angiojet) 3. Right EIA stent by Dr. Lucendia Herrlich 06/27/15    Hospital  Course.  Patient is s/p OR on 05/31/2015 with [facility] for incision and drainage of complex abdominal wall abscess, rectal EUA, cystourethroscopy, bilateral retrograde pyelograms and removal old stent and placement of left 6x24 firm stent and right ureter embolization, right nephrostomy tube exchange, left nephrostomy tube placement and pelvic drain exchange on 06/06/2015 at a hospital in [place].  She was admitted to [facility] with severe sepsis secondary to peritonitis with stool coming from multiple abdominal wounds.  She was seen by Critical Care, general surgery, and ID.  General surgery recommended NPO and bowel rest and TPN has been started.  She is receiving continued dressing changes and nephrostomy tube care.  In addition, she was noted to have gangrene of the right foot and Dr. Lucendia Herrlich performed angiogram, thrombectomy, and EIA stent on 06/27/15.  Dr. Sherlene Shams has evaluated and will follow as outpatient to consider transmetatarsal amputation at a later date.  Patient is hemodynamically stable.  She has been accepted to [facility] and will need continued wound care, IV Vanc and Zosyn, and TPN.    Condition at discharge:  Stable    Consults.  PHARMACY TO DOSE VANCOMYCIN  IP CONSULT TO HOSPITALIST  IP CONSULT TO CRITICAL CARE  IP CONSULT TO CASE MANAGEMENT  IP CONSULT TO SOCIAL WORK  IP CONSULT TO PALLIATIVE CARE  PHARMACY TO DOSE VANCOMYCIN  IP CONSULT TO DIETITIAN  IP CONSULT TO PHARMACY  IP CONSULT TO CASE MANAGEMENT  IP CONSULT TO VASCULAR SURGERY  IP CONSULT TO PODIATRY    Physical examination on discharge day.   Visit Vitals   ??? BP 114/77   ??? Pulse 102   ??? Temp 98.6 ??F (37 ??C) (Temporal)   ??? Resp 18   ??? Ht 5\' 2"  (1.575 m)   ??? Wt 109 lb (49.4 kg)   ??? SpO2 100%   ??? BMI 19.94 kg/m2     General appearance.  Alert. Looks comfortable.  HEENT. Moist mucus membranes.  Cardiovascular. Regular rate and rhythm, normal S1, S2. No murmur.   Respiratory. Not using accessory  muscles.Clear to auscultation bilaterally, no wheeze.  Gastrointestinal. Abdomen soft, dressings in place.  Neurology. Facial symmetry. No speech deficits. Moving all extremities equally.  Extremities. Right foot with erythema/discoloration with line drawn.  Skin. Warm, dry, normal turgor    Medication instructions provided to patient at discharge.     Medication List      START taking these medications          acetaminophen 10 MG/ML Soln infusion   Commonly known as:  OFIRMEV   Infuse 74.1 mLs intravenously every 6 hours as needed for Fever or Pain       aspirin 81 MG chewable tablet   1 tablet by Per NG tube route daily       clopidogrel 75 MG tablet   Commonly known as:  PLAVIX   1 tablet by Per NG tube route daily       famotidine 20 MG/2ML injection   Commonly known as:  PEPCID   Infuse 2 mLs intravenously daily       fat emulsion 20 % infusion   Infuse 250 mLs intravenously twice a week       fentaNYL 25 MCG/HR   Commonly known as:  [place] 1 patch onto the skin every 72 hours .       fluconazole 200MG /100ML IVPB   Commonly known as:  DIFLUCAN   Infuse 100 mLs intravenously every 24 hours       glucose 40 % Gel   Commonly known as:  GLUTOSE   Take 15 g by mouth as needed (glc<70)       HYDROmorphone 1 MG/ML injection   Commonly known as:  DILAUDID   Infuse 1 mL intravenously every 2 hours as needed for Pain .       insulin lispro 100 UNIT/ML pen   Commonly known as:  HUMALOG   Inject 0-6 Units into the skin every 4 hours       ipratropium-albuterol 0.5-2.5 (3) MG/3ML Soln nebulizer solution   Commonly known as:  DUONEB   Inhale 3 mLs into the lungs every 4 hours as needed for Shortness of Breath       LORazepam 2 MG/ML injection   Commonly known as:  ATIVAN   Infuse 0.5 mLs intravenously every 4 hours as needed (anxiety)       magnesium sulfate 10-5 MG/ML-%   Infuse 100 mLs intravenously as needed (Per IV Magnesium Replacement Protocol)       ondansetron 4 MG/2ML injection   Commonly known as:   ZOFRAN   Infuse 2 mLs intravenously every 6 hours as needed for Nausea       potassium chloride 20  MEQ/50ML IVPB   Infuse 50 mLs intravenously as needed (Per IV Potassium Replacement Protocol)       vancomycin 500 MG/100ML IVPB   Commonly known as:  VANCOCIN   Infuse 100 mLs intravenously every 24 hours         STOP taking these medications          gabapentin 300 MG capsule   Commonly known as:  NEURONTIN       Megestrol Acetate 800 MG/20ML Susp       morphine 30 MG extended release capsule   Commonly known as:  KADIAN       oxyCODONE HCl 10 MG immediate release tablet   Commonly known as:  OXY-IR       tamsulosin 0.4 MG capsule   Commonly known as:  FLOMAX            Where to Get Your Medications      Information about where to get these medications is not yet available     ! Ask your nurse or doctor about these medications    ??? acetaminophen 10 MG/ML Soln infusion   ??? aspirin 81 MG chewable tablet   ??? clopidogrel 75 MG tablet   ??? famotidine 20 MG/2ML injection   ??? fat emulsion 20 % infusion   ??? fentaNYL 25 MCG/HR   ??? fluconazole 200MG /100ML IVPB   ??? glucose 40 % Gel   ??? HYDROmorphone 1 MG/ML injection   ??? insulin lispro 100 UNIT/ML pen   ??? ipratropium-albuterol 0.5-2.5 (3) MG/3ML Soln nebulizer solution   ??? LORazepam 2 MG/ML injection   ??? magnesium sulfate 10-5 MG/ML-%   ??? ondansetron 4 MG/2ML injection   ??? potassium chloride 20 MEQ/50ML IVPB   ??? vancomycin 500 MG/100ML IVPB             Discharge recommendations given to patient.  Follow Up. [facility] upon arrival.  Podiatry in 1-2 weeks.  Dr. Lucendia Herrlich 4 weeks.  Dr. Rodney Booze 2-3 weeks  Disposition.  LTACH at [facility]  Activity. bedrest  Diet: Diet NPO Effective Now  PN-Adult Premix 5/20 - Standard Electrolytes - Central [place]      Spent 35 minutes in discharge process.    Signed:  [name], MD     07/04/2015 10:13 AM

## 2015-07-04 NOTE — Care Coordination-Inpatient (Signed)
Discharge summary and discharge orders faxed to [facility].  Patient/Family aware of and agreeable to the discharge plan.

## 2015-07-04 NOTE — Progress Notes (Signed)
The patient is resting with eyes closed, doesn't arouse to soft voice. Will continue to monitor. Emmerie Battaglia RN, BSN.

## 2015-07-04 NOTE — Consults (Signed)
Clinical Pharmacist Note:    Pharmacy to dose Vancomycin.    Patient is currently receiving Vancomycin 500mg  IV every 24 hrs for peritonitis.  Vancomycin trough level was 10.4.  Plan to increase vancomycin to 750mg  IV every 24 hours. Check trough 3/27 @ 0630 and monitor renal function..    Will continue to monitor.  Thank you for allowing pharmacy to participate in the care of this patient.    Stassi Fadely, Pharm.D.  [phone]

## 2015-07-04 NOTE — Plan of Care (Signed)
Problem: Discharge Planning:  Goal: Discharged to appropriate level of care  Discharged to appropriate level of care   Data- discharge order received, pt or sister (appointed legal authority) verbalized agreement to discharge, disposition to [place] [place] reviewed and signed by physician.     Action- AVS prepared, COC completed/ reported faxed by case management/social services. Discharge instruction summary: Diet- npo, tpn, Activity- pt/ot, immunizations reviewed and up to date, medications prescriptions to be filled at receiving facility, Transfer code status: Full Code, LDAs to remain with discharge: foley catheter, right & left nephrostomy tubes, luq & llq drains, rlq colostomy, mid abdominal fistula dressing.   DME used: lift, iv for tpn & antibiotics.      Response- Bedside RN called report to [facility] @ receiving facility. Pt belongings gathered, cardiac monitoring removed. Disposition to Discharged via cart/stretcher to [facility] by EMS transportation, no complications reported. Fidela Salisbury RN, BSN, PCCN.

## 2015-07-04 NOTE — Discharge Instructions (Signed)
Abdominal fistula wound dressing change 3 times daily; open top of fistula dressing, remove old 4x4s, irrigate wound bed with normal saline & gently pack with clean 4x4s.   Right lower abdomen colostomy; site care & dressing changes weekly and as needed  Left lower abdomen draining abcess with colostomy bag; change weekly and as needed  Right & left nephrostomy tubes; dressing changes as needed

## 2015-07-04 NOTE — Plan of Care (Signed)
Problem: Anxiety/Stress  Goal: Level of anxiety will decrease  Level of anxiety will decrease   Very anxious regarding wound care, refusing dressing changes, agreed to emptying of drains after being shown how much drainage is in drains. premedicated with ativan, tolerated emptying of multiple drains poorly, less anxious after RN done with emptying drains. Will continue to monitor. Fidela Salisbury RN, BSN, PCCN.        Problem: PAIN  Goal: Patient???s pain/discomfort is manageable  1100 the patient complains of pain #9. Given pain medication per request. Will continue to monitor. Fidela Salisbury RN, BSN, PCCN.

## 2015-07-05 ENCOUNTER — Inpatient Hospital Stay
Admission: RE | Admit: 2015-07-05 | Discharge: 2015-07-05 | Disposition: A | Discharge status: Still In Hospital | Payer: Medicare (Managed Care)

## 2015-07-05 ENCOUNTER — Encounter: Admit: 2015-07-05 | Primary: Internal Medicine

## 2015-07-05 ENCOUNTER — Inpatient Hospital Stay
Admission: EM | Admit: 2015-07-06 | Discharge: 2015-07-08 | Disposition: A | Discharge status: Skilled Nursing Facility | Payer: MEDICARE | Source: Home / Self Care | Admitting: Surgery

## 2015-07-05 DIAGNOSIS — L988 Other specified disorders of the skin and subcutaneous tissue: Principal | ICD-10-CM

## 2015-07-05 LAB — CBC
Hematocrit: 30.3 % — ABNORMAL LOW (ref 35.0–45.0)
Hemoglobin: 10.1 g/dL — ABNORMAL LOW (ref 11.7–15.5)
MCH: 28.9 pg (ref 27.0–33.0)
MCHC: 33.3 g/dL (ref 32.0–36.0)
MCV: 86.7 fL (ref 80.0–100.0)
MPV: 9.6 fL (ref 7.5–11.5)
Platelets: 80 10E3/uL — ABNORMAL LOW (ref 140–400)
RBC: 3.5 10E6/uL — ABNORMAL LOW (ref 3.80–5.10)
RDW: 17.2 % — ABNORMAL HIGH (ref 11.0–15.0)
WBC: 10.8 10E3/uL (ref 3.8–10.8)

## 2015-07-05 LAB — COMPREHENSIVE METABOLIC PANEL
ALT: 32 U/L (ref 7–52)
AST: 28 U/L (ref 13–39)
Albumin: 1.6 g/dL — ABNORMAL LOW (ref 3.5–5.7)
Alkaline Phosphatase: 89 U/L (ref 36–125)
Anion Gap: 11 mmol/L (ref 3–16)
BUN: 23 mg/dL (ref 7–25)
CO2: 17 mmol/L — ABNORMAL LOW (ref 21–33)
Calcium: 7.6 mg/dL — ABNORMAL LOW (ref 8.6–10.3)
Chloride: 110 mmol/L (ref 98–110)
Creatinine: 0.53 mg/dL — ABNORMAL LOW (ref 0.60–1.30)
Glucose: 58 mg/dL — ABNORMAL LOW (ref 70–100)
Osmolality, Calculated: 287 mosm/kg (ref 278–305)
Potassium: 4.2 mmol/L (ref 3.5–5.3)
Sodium: 138 mmol/L (ref 133–146)
Total Bilirubin: 0.9 mg/dL (ref 0.0–1.5)
Total Protein: 4.4 g/dL — ABNORMAL LOW (ref 6.4–8.9)
eGFR AA CKD-EPI: >90 See note.
eGFR NONAA CKD-EPI: >90 See note.

## 2015-07-05 LAB — DIFFERENTIAL
Basophils Absolute: 11 /uL (ref 0–200)
Basophils Relative: 0.1 % (ref 0.0–1.0)
Eosinophils Absolute: 0 /uL — ABNORMAL LOW (ref 15–500)
Eosinophils Relative: 0 % (ref 0.0–8.0)
Lymphocytes Absolute: 1976 /uL (ref 850–3900)
Lymphocytes Relative: 18.3 % (ref 15.0–45.0)
Monocytes Absolute: 778 /uL (ref 200–950)
Monocytes Relative: 7.2 % (ref 0.0–12.0)
Neutrophils Absolute: 8035 /uL — ABNORMAL HIGH (ref 1500–7800)
Neutrophils Relative: 74.4 % (ref 40.0–80.0)
nRBC: 1 /100{WBCs} — ABNORMAL HIGH (ref 0–0)

## 2015-07-05 LAB — POC GLU MONITORING DEVICE
POC Glucose Monitoring Device: 65 mg/dL — ABNORMAL LOW (ref 70–100)
POC Glucose Monitoring Device: 178 mg/dL — ABNORMAL HIGH (ref 70–100)

## 2015-07-05 LAB — PREALBUMIN: Prealbumin: 14 mg/dL (ref 17.0–34.0)

## 2015-07-05 LAB — TYPE AND SCREEN: Antibody Screen: NEGATIVE

## 2015-07-05 LAB — POCT VENOUS
BNP: 498 pg/mL — ABNORMAL HIGH (ref 0.0–99.9)
Base Excess, Ven: -13 — ABNORMAL LOW (ref <=3)
CO2: 13 mmol/L — CL (ref 21–32)
Calcium, Ionized: 1.15 mmol/L (ref 1.12–1.32)
GFR African American: >60
GFR Non-African American: >60 (ref >60)
HCO3, Venous: 12.5 mmol/L — ABNORMAL LOW (ref 23.0–29.0)
Lactate: 4.3 mmol/L (ref 0.40–2.00)
O2 Sat, Ven: 36 %
POC Anion Gap: 13 (ref 10–20)
POC BUN: 19 mg/dL — ABNORMAL HIGH (ref 7–18)
POC Chloride: 112 mmol/L — ABNORMAL HIGH (ref 99–110)
POC Creatinine: 0.5 mg/dL — ABNORMAL LOW (ref 0.6–1.1)
POC Glucose: 119 mg/dl — ABNORMAL HIGH (ref 70–99)
POC Potassium: 4.5 mmol/L (ref 3.5–5.1)
POC Sodium: 138 mmol/L (ref 136–145)
POC Troponin I: 0.02 ng/mL (ref 0.00–0.10)
TC02 (Calc), Ven: 13 mmol/L
pCO2, Ven: 23.2 mm Hg — ABNORMAL LOW (ref 40.0–50.0)
pH, Ven: 7.339 (ref 7.320–7.420)
pO2, Ven: 22 mm Hg

## 2015-07-05 LAB — POC URINE WITH MICROSCOPIC
Bilirubin Urine: NEGATIVE mg/dL
Glucose, Ur: NEGATIVE mg/dL
Ketones, Urine: NEGATIVE mg/dL
Nitrite, Urine: NEGATIVE
Protein, UA: >=300 mg/dL — AB
Specific Gravity, UA: 1.025 (ref 1.005–1.030)
Urobilinogen, Urine: 0.2 E.U./dL (ref <2.0)
pH, UA: 8.5 — AB (ref 5.0–8.0)

## 2015-07-05 LAB — LIPASE: Lipase: 49 U/L (ref 13.0–60.0)

## 2015-07-05 LAB — HEPATIC FUNCTION PANEL
ALT: 25 U/L (ref 10–40)
AST: 23 U/L (ref 15–37)
Albumin: 0.8 g/dL — ABNORMAL LOW (ref 3.4–5.0)
Alkaline Phosphatase: 77 U/L (ref 40–129)
Bilirubin, Direct: <0.2 mg/dL (ref 0.0–0.3)
Total Bilirubin: 0.4 mg/dL (ref 0.0–1.0)
Total Protein: 3.4 g/dL — ABNORMAL LOW (ref 6.4–8.2)

## 2015-07-05 LAB — ABO/RH: ABO/Rh: B POS

## 2015-07-05 LAB — PREPARE RBC (CROSSMATCH): Dispense Status Blood Bank: TRANSFUSED

## 2015-07-05 LAB — CBC WITH AUTO DIFFERENTIAL
Basophils %: 0.1 %
Basophils Absolute: 0 10*3/uL (ref 0.0–0.2)
Eosinophils %: 0 %
Eosinophils Absolute: 0 10*3/uL (ref 0.0–0.6)
Hematocrit: 21.5 % — ABNORMAL LOW (ref 36.0–48.0)
Hemoglobin: 6.8 g/dL — CL (ref 12.0–16.0)
Lymphocytes %: 26.6 %
Lymphocytes Absolute: 4.3 10*3/uL (ref 1.0–5.1)
MCH: 28.6 pg (ref 26.0–34.0)
MCHC: 31.6 g/dL (ref 31.0–36.0)
MCV: 90.6 fL (ref 80.0–100.0)
MPV: 9.9 fL (ref 5.0–10.5)
Monocytes %: 7.3 %
Monocytes Absolute: 1.2 10*3/uL (ref 0.0–1.3)
Neutrophils %: 66 %
Neutrophils Absolute: 10.6 10*3/uL — ABNORMAL HIGH (ref 1.7–7.7)
Platelets: 99 10*3/uL — ABNORMAL LOW (ref 135–450)
RBC: 2.37 M/uL — ABNORMAL LOW (ref 4.00–5.20)
RDW: 17.5 % — ABNORMAL HIGH (ref 12.4–15.4)
WBC: 16.1 10*3/uL — ABNORMAL HIGH (ref 4.0–11.0)

## 2015-07-05 LAB — MICROSCOPIC URINALYSIS: RBC, UA: >100 /HPF — AB (ref 0–2)

## 2015-07-05 MED ORDER — LACTATED RINGERS IV SOLN
INTRAVENOUS | Status: AC
Start: 2015-07-05 — End: 2015-07-05
  Administered 2015-07-05: 22:00:00 1000 via INTRAVENOUS

## 2015-07-05 MED ORDER — SODIUM CHLORIDE 0.9 % IV BOLUS
0.9 % | Freq: Once | INTRAVENOUS | Status: AC
Start: 2015-07-05 — End: 2015-07-06

## 2015-07-05 MED ORDER — SODIUM CHLORIDE 0.9 % IV SOLN
0.9 % | INTRAVENOUS | Status: AC
Start: 2015-07-05 — End: 2015-07-06
  Administered 2015-07-05: 22:00:00 250 via INTRAVENOUS

## 2015-07-05 MED ORDER — LACTATED RINGERS IV SOLN
INTRAVENOUS | Status: DC
Start: 2015-07-05 — End: 2015-07-05

## 2015-07-05 MED ORDER — KETAMINE HCL 50 MG/ML IJ SOLN
50 MG/ML | Freq: Once | INTRAMUSCULAR | Status: AC
Start: 2015-07-05 — End: 2015-07-05
  Administered 2015-07-05: 22:00:00 5 mg/kg via INTRAVENOUS

## 2015-07-05 MED ORDER — IOPAMIDOL 76 % IV SOLN
76 % | Freq: Once | INTRAVENOUS | Status: AC | PRN
Start: 2015-07-05 — End: 2015-07-05
  Administered 2015-07-05: 80 mL via INTRAVENOUS

## 2015-07-05 MED ORDER — DEXTROSE 5 % IV SOLN (MINI-BAG)
5 % | Freq: Once | INTRAVENOUS | Status: AC
Start: 2015-07-05 — End: 2015-07-06
  Administered 2015-07-06: 07:00:00 3.375 g via INTRAVENOUS

## 2015-07-05 MED ORDER — ONDANSETRON HCL 4 MG/2ML IJ SOLN
4 MG/2ML | INTRAMUSCULAR | Status: AC
Start: 2015-07-05 — End: 2015-07-06

## 2015-07-05 MED ORDER — OXIDIZED CELLULOSE EX PADS
CUTANEOUS | Status: AC
Start: 2015-07-05 — End: 2015-07-06
  Administered 2015-07-06: 07:00:00

## 2015-07-05 MED ORDER — sterile water (PF) Soln 2.2 mL
INTRAMUSCULAR | Status: AC | PRN
Start: 2015-07-05 — End: 2015-07-05

## 2015-07-05 MED ORDER — sodium chloride 0.9 % flush 3-30 mL
INTRAMUSCULAR | Status: AC
Start: 2015-07-05 — End: 2015-07-05
  Administered 2015-07-05: 18:00:00 40 mL

## 2015-07-05 MED ORDER — HYDROmorphone (DILAUDID) 1 mg/mL injection Syrg
1 | INTRAMUSCULAR | Status: AC
Start: 2015-07-05 — End: 2015-07-05
  Administered 2015-07-05: 10:00:00 1

## 2015-07-05 MED ORDER — alteplase (CATHFLO) injection 2 mg
2 | Status: AC | PRN
Start: 2015-07-05 — End: 2015-07-05

## 2015-07-05 MED ORDER — HYDROmorphone (DILAUDID) injection Syrg 1 mg
1 | INTRAMUSCULAR | Status: AC | PRN
Start: 2015-07-05 — End: 2015-07-05
  Administered 2015-07-05 (×3): 1 mg via INTRAVENOUS

## 2015-07-05 MED ORDER — dextrose (INSTA-GLUCOSE) 40 % gel 15 g
40 | ORAL | Status: AC | PRN
Start: 2015-07-05 — End: 2015-07-05

## 2015-07-05 MED ORDER — heparin lock flush 100 unit/mL Syrg 500 Units
100 | INTRAVENOUS | Status: AC | PRN
Start: 2015-07-05 — End: 2015-07-05

## 2015-07-05 MED ORDER — sodium chloride 0.9 % flush 3-30 mL
INTRAMUSCULAR | Status: AC | PRN
Start: 2015-07-05 — End: 2015-07-05

## 2015-07-05 MED ORDER — piperacillin-tazobactam (ZOSYN) 3.375 gram injection
3.375 | INTRAVENOUS | Status: AC
Start: 2015-07-05 — End: 2015-07-05
  Administered 2015-07-05: 05:00:00

## 2015-07-05 MED ORDER — HYDROmorphone (DILAUDID) 1 mg/mL injection Syrg
1 | INTRAMUSCULAR | Status: AC
Start: 2015-07-05 — End: 2015-07-05
  Administered 2015-07-05: 04:00:00 1

## 2015-07-05 MED ORDER — insulin regular (HumuLIN R) injection Soln 0-5 Units
100 | Freq: Four times a day (QID) | INTRAMUSCULAR | Status: AC
Start: 2015-07-05 — End: 2015-07-05

## 2015-07-05 MED ORDER — dextrose 50 % in water (D50W) iv Syrg
INTRAVENOUS | Status: AC
Start: 2015-07-05 — End: 2015-07-05
  Administered 2015-07-05: 16:00:00 50 via INTRAVENOUS

## 2015-07-05 MED ORDER — dextrose 50 % in water (D50W) iv Syrg 25-50 mL
INTRAVENOUS | Status: AC | PRN
Start: 2015-07-05 — End: 2015-07-05

## 2015-07-05 MED ORDER — TPN ADULT (WCH/Drake)
3 | INTRAVENOUS | Status: AC
Start: 2015-07-05 — End: 2015-07-05

## 2015-07-05 MED ORDER — glucose chewable tablet 12 g
4 | ORAL | Status: AC | PRN
Start: 2015-07-05 — End: 2015-07-05

## 2015-07-05 MED ORDER — dextrose 50 % in water (D50W) iv Syrg 50 mL
Freq: Once | INTRAVENOUS | Status: AC
Start: 2015-07-05 — End: 2015-07-05

## 2015-07-05 MED FILL — CLOPIDOGREL 75 MG TABLET: 75 75 mg | ORAL | Qty: 1

## 2015-07-05 MED FILL — PIPERACILLIN-TAZOBACTAM 3.375 GRAM INTRAVENOUS SOLUTION: 3.375 3.375 gram | INTRAVENOUS | Qty: 1

## 2015-07-05 MED FILL — HYDROMORPHONE 1 MG/ML INJECTION SYRINGE: 1 1 mg/mL | INTRAMUSCULAR | Qty: 1

## 2015-07-05 MED FILL — VANCOMYCIN 1,000 MG INTRAVENOUS INJECTION: 1000 1000 mg | INTRAVENOUS | Qty: 750

## 2015-07-05 MED FILL — FAMOTIDINE (PF) 20 MG/2 ML INTRAVENOUS SOLUTION: 20 20 mg/2 mL | INTRAVENOUS | Qty: 2

## 2015-07-05 MED FILL — LORAZEPAM 2 MG/ML INJECTION SOLUTION: 2 2 mg/mL | INTRAMUSCULAR | Qty: 1

## 2015-07-05 MED FILL — ZOSYN 3.375 GRAM INTRAVENOUS SOLUTION: 3.375 3.375 gram | INTRAVENOUS | Qty: 2

## 2015-07-05 MED FILL — TRAVASOL 10 % INTRAVENOUS SOLUTION: 10 10 % | INTRAVENOUS | Qty: 720

## 2015-07-05 MED FILL — HUMULIN R REGULAR U-100 INSULIN 100 UNIT/ML INJECTION SOLUTION: 100 100 unit/mL | INTRAMUSCULAR | Qty: 3

## 2015-07-05 MED FILL — FLUCONAZOLE 200 MG/100 ML IN SOD. CHLORIDE (ISO) INTRAVENOUS PIGGYBACK: 200 200 mg/100 mL | INTRAVENOUS | Qty: 100

## 2015-07-05 MED FILL — DEXTROSE 50 % IN WATER (D50W) INTRAVENOUS SYRINGE: 50.00 50.00 mL | INTRAVENOUS | Qty: 50

## 2015-07-05 MED FILL — ASPIRIN 81 MG CHEWABLE TABLET: 81 81 MG | ORAL | Qty: 1

## 2015-07-05 MED FILL — SURGICEL EX PADS: CUTANEOUS | Qty: 2

## 2015-07-05 MED FILL — SODIUM CHLORIDE 0.9 % IV SOLN: 0.9 % | INTRAVENOUS | Qty: 2000

## 2015-07-05 MED FILL — ONDANSETRON HCL 4 MG/2ML IJ SOLN: 4 MG/2ML | INTRAMUSCULAR | Qty: 2

## 2015-07-05 MED FILL — LACTATED RINGERS IV SOLN: INTRAVENOUS | Qty: 2000

## 2015-07-05 MED FILL — KETAMINE HCL 50 MG/ML IJ SOLN: 50 MG/ML | INTRAMUSCULAR | Qty: 10

## 2015-07-05 MED FILL — SODIUM CHLORIDE 0.9 % IV SOLN: 0.9 % | INTRAVENOUS | Qty: 250

## 2015-07-05 NOTE — Unmapped (Signed)
Copious bleeding from abd fistula. Dacio MD assessed at the bedside. Voicemails left for sister x2. Dacio planning to acute out to [facility].

## 2015-07-05 NOTE — Unmapped (Addendum)
Physical Therapy  Physical Therapy Initial Assessment     Name: Linda Ponce  DOB: 01-11-1971  Attending Physician: No att. 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 She was treated in [place] in February 2017 for an acute abdominal abscess and discharged in improved condition, only to be readmitted there for increase in complex abdominal abscess and again here in [place] where she presented in [place] sepsis. Treated in the ICU with broad spectrum antibiotics, as she had a feculent peritonitis, and pigtail drain placement LLQ. Records are scanty but at some point she developed limb ischemia with necrotic/gangrenous toes and forefoot on the right. She continues to require IV antibiotics and dilaudid, as her pain was poorly controlled and she is NPO. The majority of her pain is in her legs and her fistulous wound during care. PRECAUTIONS: FULL CODE, AAT, NO WT BEARING LIMITATIONS, ISCHEMIC R TOES AND FOREFOOT, TPN, NPO  Assessment     Assessment: Impaired Bed Mobility, Impaired Transfer Mobility, Impaired Ambulation, Impaired Balance, Deconditioning, Impaired Strength  Prognosis: Guarded (due to pt's medical condition)  Goals  Pt Will Go Supine To Sit: Modified Independent (to get in and out bed within 6 weeks. )  Sit To Stand: Min A with LRAD to prepare for gait and transfers within 6 weeks.   Pt Will Transfer Bed/Chair: Minimal (Min A with LRAD to facilitate (I) in home within 6 weeks. )  Pt Will Ambulate: Minimal (25' with LRAD to access home within 6 weeks. )  Miscellaneous Goal #1: Pt will be independent with HEP to improve B LE strength to facilitate safe mobility in 6 weeks.   Miscellaneous Goal #2: Pt will demonstrate  improved safe mobility to return home as demonstrated by improved Elderly Mobility Scale outcome of > 10/20.  Long Term Goal : Pt will negotiate 12 steps with 1 rail and LRAD to enter and exit home with min A in 8 weeks.   Time frame for goals to be met in: 8 weeks, 09/03/15   Recommendation  Plan  Treatment/Interventions: LE strengthening/ROM, Endurance training, Patient/family training, Equipment eval/education, Museum/gallery curator, Therapeutic Exercise, Therapeutic Activity, Continued evaluation, Compensatory technique education, Gait training, Neuromuscular Reeducation  PT Frequency: 1-3x/wk    Recommendation  Recommendation: Short-term skilled PT  Equipment Recommended: Defer at this time  Problem List  Patient Active Problem List   Diagnosis   ??? Abdominal abscess      Past Medical History  Past Medical History   Diagnosis Date   ??? Cancer    ??? Abscess of abdominal cavity    ??? Gangrene of foot    ??? AKI (acute kidney injury)    ??? Fistula    ??? Peritonitis and retroperitoneal infections    ??? Cachexia    ??? Hyperkalemia    ??? Dehydration    ??? Encephalopathies    ??? Ischemic foot       Past Surgical History  Past Surgical History   Procedure Laterality Date   ??? Colon surgery       Patient Stated Goals  Goal #1: To get better.    Home Living/Prior Function  Type of Home: House  Home Layout: Multi-level;Able to live on main level with bedroom/bathroom;1/2 bath on main level;Stairs to enter  with rails (Pt unsure but states about a flight of 12-13 STE with rail)  Bathroom Shower/Tub: Pension scheme manager: Production assistant, radio  Level of Independence: Independent  Lives With: Family  Receives Help From: Family  ADL Assistance: Independent  Homemaking/IADL Assistance: Independent  Vocational: On disability (pt states she was working some in [place] )  Comments: I with driving     Pain  Pain Score:   9  Pain Location: Generalized (all over )  Pain Descriptors: Aching (everything )  Pain  Intervention(s): Rest;Repositioned  Therapist reported pain to:: Nsg made aware but pain meds not due at this time    Vision  Current Vision: No visual deficits    Cognition  Overall Cognitive Status: Within Functional Limits  Orientation Level: Oriented X4    Sensation  Additional Comments: Pt c/o numbness and tingling in B feet R >L   Proprioception: No apparent deficits  Inattention/Neglect: Appears intact  Initiation: Appears intact  Motor Planning: Appears intact  Perseveration: Not present    Upper Extremity  RUE Assessment: Within Functional Limits     LUE Assessment: Within Functional Limits     Lower Extremity  RLE Assessment  RLE Assessment:  (pt performed 25% AROM with hip abd/add, DF/PF 50% AROM, pt limited by pain with movement)  LLE Assessment  LLE Assessment:  (pt able to perform 75% AROM DF/PF but declined other movement due to pain )  Functional Mobility  Bed Mobility Eval  Rolling: Unable to assess (Comment) (Pt declined due to pain )  Supine to Sit: Unable to assess (Comment)     ELDERLY MOBILITY SCALE SCORE  TASK ACTIVITY  SCORE   Lying to Sitting 2 Independent  1 Needs help of 1 person  0 Needs help of 2+ people 0   Sitting to Lying 2 Independent  1 Needs help of 1 person  0 Needs help of 2+ people 0   Sitting to Standing 3 Independent in under 3 seconds  2 Independent in over 3 seconds  1 Needs help of 1 person  0 Needs help of 2+ people 0   Standing 3 Stands without support and able to reach  2 Stands without support but needs support to reach  1 Stands but needs support  0 Stands only with physical support of another person 0   Gait 3 Independent (+/- stick)  2 Independent with frame  1 Mobile with walking aid but erratic/unsafe  0 Needs physical help to walk or constant supervision 0   Timed Walk (6 meters) 3 Under 15 seconds  2 16-30 seconds  1 Over 30 seconds  0 Unable to cover 6 meters    0     Recorded time in seconds   Functional Reach 4 Over 20 cm  2 10-20 cm  0 Under 10 cm  0      Total 0/20         Pt in too much pain and fatigue to perform OOB activity even with pain meds provided, pt states she does not want to get up. Pt reports increased pain with ankle pumps. Pt noted to have ischemic R toes and forefoot. Pt left supine in bed with all needs in reach. Attempted to sit pt EOB x 2.    Patient Education  Role of PT, goals, POC, benefit of OOB  Time  Start Time: 1425  Stop Time: 1500  Time Calculation (min): 35 min    Charges   $  PT Evaluation High Complex 45 Min: 1 Procedure      Rulon Abide, PT, DPT [phone]

## 2015-07-05 NOTE — Unmapped (Signed)
I attempted to meet with patient to complete Social Worker assessment but she was sleeping. I will attempt to with patient later today or Monday to complete Social Work Assessment.    April Ahr MSW,LSW  [phone]

## 2015-07-05 NOTE — Unmapped (Signed)
Clinical Pharmacy Note - TPN Order    Linda Ponce is a 45 y.o. female that is being followed by the clinical pharmacy department for monitoring and adjustment of TPN.    Patient is NPO with ice chips    Plan:    -adjusted Calcium is 9.52  -will follow formulation as closely as possible documented per previous facility [facility])  until more lab information available  -BMP ordered for tomorrow as well as routine TPN labs q Mon-Thu  -continue continuous formula as below:    TPN Electrolytes   Sodium Chloride: 35 mEq   Sodium Acetate: 60 mEq   Sodium Phosphate: 0 mmol   Magnesium Sulfate: 13.2 mEq   Calcium Gluconate: 4.5 mEq   Potassium Chloride: 20 mEq   Potassium Acetate: 10 mEq   Potassium Phosphate: 15 mmol TPN Macronutrients   Volume: 1440 mL   Rate: 60 mL/hr   Amino Acids: 72 grams/day (50 g/L)    Dextrose: 288 grams/day (200 g/L)    Lipids: 14 grams/day  (10 g/L)  TPN Additives   MVI 10 mL   TE 1 mL                  SODIUM   Date Value Ref Range Status   07/05/2015 138 133 - 146 mmol/L Final     CHLORIDE   Date Value Ref Range Status   07/05/2015 110 98 - 110 mmol/L Final     CO2   Date Value Ref Range Status   07/05/2015 17* 21 - 33 mmol/L Final     POTASSIUM   Date Value Ref Range Status   07/05/2015 4.2 3.5 - 5.3 mmol/L Final     CALCIUM   Date Value Ref Range Status   07/05/2015 7.6* 8.6 - 10.3 mg/dL Final     GLUCOSE   Date Value Ref Range Status   07/05/2015 58* 70 - 100 mg/dL Final

## 2015-07-05 NOTE — Unmapped (Signed)
[place]    [name]   Wound Care Admission Assessment  07/05/2015       Linda Ponce is an 45 y.o. female.  DOB: 01/22/1971    Pressure ulcers on admission:  WCT skin assessment completed one stage 1 on right elbow, one stage 2 on left buttocks and one DTI on left elbow on admission.    Diagnosis: [facility] 06/25/15 dx: sepsis    Allergies   Allergen Reactions   ??? Compazine [Prochlorperazine Edisylate]    ??? Latex, Natural Rubber        History:    Past Medical History   Diagnosis Date   ??? Cancer    ??? Abscess of abdominal cavity    ??? Gangrene of foot    ??? AKI (acute kidney injury)    ??? Fistula    ??? Peritonitis and retroperitoneal infections    ??? Cachexia    ??? Hyperkalemia    ??? Dehydration    ??? Encephalopathies    ??? Ischemic foot      Past Surgical History   Procedure Laterality Date   ??? Colon surgery       Social History   Substance Use Topics   ??? Smoking status: None   ??? Smokeless tobacco: None   ??? Alcohol Use: None     Present admission history of illness/MD note.HPI 45 year old AA female with history of perforated colorectal adenocarcinoma in 2004 s/p radiation and pelvic exenteration, with recurrent pre-sacral peritoneal abscesses. She was treated in [place] in February 2017 for an acute abdominal abscess and discharged in improved condition, only to be readmitted there for increase in complex abdominal abscess and again here in [place] where she presented in florid sepsis. Treated in the ICU with broad spectrum antibiotics, as she had a feculent peritonitis, and pigtail drain placement LLQ. Records are scanty but at some point she developed limb ischemia with necrotic/gangrenous toes and forefoot on the right. She continues to require IV antibiotics and dilaudid, as her pain was poorly controlled and she is NPO. The majority of her pain is in her legs and her fistulous wound during care. She also requires TPN/nutritional support, PT/OT, and wound care.    Skin/Wound Care  Assessment.    Braden:12     Pressure wounds:    Right Elbow   STAGE:  I Measures:  L  cm X W  cm X D   cm.   Wounds progress:   Initial assessment    Wound Assessment:  Nonblanchable erythema   Treatment/Dressing: mepilex border, change q 3 days and prn     _____________________________________________________________________    Pressure wounds:    Left Elbow   . STAGE: DEEP TISSUE INJURY  Measures:  L  cm X W  cm X D   cm.   Wounds progress:   Initial assessment    Wound Assessment: nonblanchable dark purple clored tissue, no drainage, no open wounds   Treatment/Dressing: mepilex border, change q 3 days and prn    _____________________________________________________________________    Pressure wounds:    Left Buttocks x 2   STAGE:    II   Measures:  L 2.0 cm X W 1.5 cm X D 1.0  cm. And distal wound measures:L1.5cmx1.0cmx0.0cm  Wounds progress:   Initial assessment    Wound Assessment:  Partial thickness tissue loss, scant serosanganious drainage, edges attached,  Treatment/Dressing: Cleansed with soap and water, pat dry, apply mepilex sacral border, change q  3 days and prn       _____________________________________________________________________    Surgical wounds:       Mid Abdominal Wound with fistula   Full Thickness. Measures: L 6.0 cm X W 4.0 cm X  D 1.0cm.    Wounds progress:   Initial assessment    Wound Assessment:  Full thickness tissue loss with opening in center of wound which measures approximnately 0.5cmx0.5cm, small amount of yellow broenish colored drainage, edges rolled and attached    Treatment/Dressing: Cleanse with soap and water, pat dry, cavilon to wound edges, apply Coloplast Fistula Management Device Mini # [id number] pack wound bed with gauze, preferbaly with kerlix gauze, change q 3-4 hours and prn         _____________________________________________________________________  Surgical wounds:   Left Lateral Abdomen Fistula   Full Thickness. Measures: L6.0  cm X W4.5  cm X  D 1.0cm.    Wounds  progress:   Initial assessment     Wound Assessment:  Full thickness opening with moderate amount of dark colored effluent, edges attached, unable to assess wound entirety due to patient yelling out.   Treatment/Dressing: Cleanse with warm soap and water, pat dry, cavilon spray to wound edges, cut out size in one piece ostomy pouch#[id number], apply pouch, change q 3 days and prn, measure and record effluent daily     Approximately 200cc of fluid in pouch.      _____________________________________________________________________  Surgical wounds:   Drains x 3, left groin, Left upper back and Right lateral lower quadrant  .    Wounds progress:   Initial assessment    Wound Assessment: left groin sutured in, and clamped, left upper quadrant to gravity drainage approximately 20cc of bloody drainage in bag, and right lower quadrant drain sutured in, small amount of drainage   Treatment/Dressing: Cleanse with soap and water, pat dry, apply cavilon to wound edges, cover with dry gauze secure with tegaderm to left lower drain and right groin, right lower quadrant drain open to air unless draining then apply dry gauze and tegaderm     Left Groin drain                                                     Left upper quadrant drain                                               Right lower abdomen drain    _____________________________________________________________________  Vascular Wounds on Right Foot     Wounds progress:   Initial assessment     Wound Assessment:  Dry black eschar   Treatment/Dressing: betadine paint daily, LOTA        _____________________________________________________________________  Surgical wounds:  Colostomy   RLQ  Wound Progress: initial assessment  Wound Treatment: Cleanse with soap and water, pat dry, apply Eakins Pouch [phone], change q 5 days and prn    _____________________________________________________________________  Abrasion  Right Upper thigh  Wound Progress: initial assessment  Wound  Assessment:partial thickness tissue loss with scab in center of wound, no drainage or redness  Wound Treatment:Cleanse with soap and water, pat dry, applyt mepilex border, change q 5 days  _______________________________________________________________________  Patient very anxious and at first refusing care even after IV dilaudid, then antianxiety medication given and patient relaxed a little to complete assessment.  Treatment time: 2 nurses approximately 2 hours     Preventative measures.    Off loading and pressure relief as measures are using an Accumax pump to  mattress, Turning Wedge, pillows, and waffle boots to aid in off load of pressure points.  Other measures include lotion's and cream's to Hydrate and moisturize dry skin.  Moisture barrier cream [employer] clear is provided to protect groin and peri anal area from incontinence.          Labs:   Lab Results   Component Value Date    GLUCOSE 58* 07/05/2015    BUN 23 07/05/2015    CO2 17* 07/05/2015    CREATININE 0.53* 07/05/2015    K 4.2 07/05/2015    NA 138 07/05/2015    CL 110 07/05/2015    CALCIUM 7.6* 07/05/2015     Lab Results   Component Value Date    WBC 10.8 07/05/2015    HGB 10.1* 07/05/2015    HCT 30.3* 07/05/2015    MCV 86.7 07/05/2015    PLT 80* 07/05/2015     No results found for: PTT, INR  Lab Results   Component Value Date    PREALBUMIN 14.0* 07/05/2015     Lab Results   Component Value Date    ALBUMIN 1.6* 07/05/2015     No results found for: ESR, CRP

## 2015-07-05 NOTE — Unmapped (Signed)
Inpatient Discharge Summary    Patient: Linda Ponce  Age: 45 y.o.  MRN: [id number]  CSN: [id number]  Date of Admission: 07/04/2015  Date of Discharge: 07/05/2015  Attending Physician: Raylene Miyamoto*  Primary Care Physician: Attending Provider Unknown          Discharge Diagnosis     Active Hospital Problems    *Abdominal abscess    anemia, acute GI blood loss anemia      Consulting Services (include reason)      INPATIENT CONSULT TO PHARMACY FOR MEDICATION MANAGEMENT   IP CONSULT TO INFECTIOUS DISEASES  INPATIENT CONSULT TO PHARMACY FOR MEDICATION MANAGEMENT     Allergies      Allergies   Allergen Reactions   ??? Compazine [Prochlorperazine Edisylate]    ??? Latex, Natural Rubber        Discharge Medications         Medication List      Notice     You have not been prescribed any medications.           Discharge Exam    Results:  @SLAB @  Last POC Glucose:      Component Value Date/Time    POCGMD 178* 07/05/2015 1159    POCGMD 65* 07/05/2015 1121     Last INR:  No results found for: INR, PROTIME  Last Prealbumin:  Lab Results   Component Value Date    PREALBUMIN 14.0* 07/05/2015     Last ABG:   No results found for: PHART, PCO2, PO2ART, HCO3ART, BEART, HBO2PER, O2SATART  Last Blood Culture:  No results found for: AEROBOT, ANABOT, LABGRAM, ISO2, ISO3, ISO4, ISO5, ISO6, PNAFISH  Vital signs in last 24 hours:  Temp:  [97.6 ??F (36.4 ??C)-97.8 ??F (36.6 ??C)] 97.6 ??F (36.4 ??C)  Heart Rate:  [94-147] 147  Resp:  [18] 18  BP: (103-119)/(62-91) 105/62 mmHg    Gen: NAD on room air, awake, alert, responsive, disoriented  H/N : (+) pallor, EBRTL pupils, anicteric sclera  C/L CTA  CARD : tachy, regular  ABD : flat, soft  EXT : (+) edema      Reason for Admission      Abdominal abscess    Hospital Course      Patient was transferred here yesterday for continued therapy for peritoneal abscess. She developed brb from colostomy and fistula drainage. Will be sent to [facility] ER via 911. IV NS bolus infusing.     Condition on  Discharge           Discharge specific orders:     Code Status: Full Code         Dietary Restrictions / Tube Feeding / TPN:        Diet:        Diet Orders          TPN ADULT ([facility]) at 60 mL/hr starting at 03/24 1800    Diet NPO effective now Except for: except meds and ice Able to chew meds starting at 03/23 2149          Disposition      ER  Follow-Up Appointments    Future Appointments:  No future appointments.    Total time spent with patient and reconciling medication and compiling discharge summery  was ___  minutes.         Signed:   Mora Bellman, MD  07/05/2015  5:06 PM

## 2015-07-05 NOTE — Unmapped (Signed)
PROGRESS NOTE   INTERNAL MEDICINE  DRAKE.    SUBJECTIVE:  ON TPO, NPO, BS WAS LOW TODAY D 50 % WAS ORDERED, TPN TO BE STARTED NOW.   NO SIGNIFICANT PAIN AT THIS TIME, NO FEVER, NO NAUSEA NO VOMITING.   DRESSING IN ABDOMEN CHANGED TODAY        OBJECTIVE:    NC/AT  ORAL :  MUCOSAE, NORMAL. OROPHARYNX IS NORMAL. EAR CANAL NORMAL.   NECK: SUPPLE, NO LAD, NO BRUIT.  CVS: RRR. NO MM.  CHEST :  CTA, NO RALES.  ABDOMEN: SOFT, NON TENDER, NO MASS. POSITIVE BS.  EXTREMITIES: NO EDEMA , NO CALF TENDERNESS. NO CYANOSIS.  NEUROLOGIC: ORIENTED , NO FOCAL DEFICIT. SENSORY NORMAL. EOMY, PERLA,   GU: NO COSTOVERTEBRAL TENDERNESS, NOT ENLARGED KIDNEYS. GENITALIA, NOT EXPLORED.     LABS:        Lab 07/05/15  0659   WBC 10.8   HEMOGLOBIN 10.1*   HEMATOCRIT 30.3*   MEAN CORPUSCULAR VOLUME 86.7   PLATELETS 80*         Lab 07/05/15  0703   SODIUM 138   POTASSIUM 4.2   CHLORIDE 110   CO2 17*   BUN 23   CREATININE 0.53*   GLUCOSE 58*   CALCIUM 7.6*             Lab 07/05/15  0703   ALT 32   AST 28   ALK PHOS 89   BILIRUBIN TOTAL 0.9   ALBUMIN 1.6*           Invalid input(s): KEYTONESU      ASSESSMENT AND PLAN        1. Peritoneal abscess- Mixed flora, pigtail drain 06/26/15 Refused followup CT.  ID consult- Vanco/Zosyn/Diflucan.   2. Fistula- wound care, bowel rest  3. Severe intractable acute on chronic pain syndrome- receiving Dilaudid IV BID prn, FENTANYL PATCH CONTINUE WITH 25 MCG EVERY 72 HRS.   4. Thrombocytopenia- uncertain etiology- follow closely. Per ID at [facility],IMPROVING TO 80 TODAY. Cachexia-   continue TPN, check PAB  5. Sepsis resolved  6. Ischemic feet/arterial thromboemboli- currently on aspirin and Plavix. Refused all heparin injections  7. History of colon cancer s/p exenteration with bilateral nephrostomy tubes  8. Debility- PT/OT      Nutrition:   Diet Orders          TPN ADULT ([facility]) at 60 mL/hr starting at 03/24 1800    Diet NPO effective now Except for: except meds and ice Able to chew meds starting at 03/23 2149           Code Status: Full Code    Signed:  [facility] Mickle Plumb, MD  07/05/2015, 12:50 PM

## 2015-07-05 NOTE — Unmapped (Signed)
Clinical Pharmacy Note    Salimata Christenson is a 45 y.o. female that is being followed by the clinical pharmacy department for monitoring and adjustment of vancomycin.    Indication:  Feculent peritonitis/abscess  Goal trough: 10-15  Other antibiotics:  Zosyn 3.375 g IV q8h, Fluconazole 200 mg  IV q24h    3/15 abscess fluid cx:  Bacteroides, Enterococcus faecalis/faecium-amp-R  Patient afebrile upon admission      Plan:    -prior to admission was receiving Vancomycin 500 mg IV q24h  -last Vancomycin trough at previous facility was 10.4  -continue Vancomycin 750 mg IV q24h, dose increase suggested at discharge  -next trough scheduled 3/27 at 0930        CREATININE   Date Value Ref Range Status   07/05/2015 0.53* 0.60 - 1.30 mg/dL Final     WBC   Date Value Ref Range Status   07/05/2015 10.8 3.8 - 10.8 10E3/uL Final        No results found for: [facility]       Thank you for the consult.    Latesha Chesney E Deidre Carino [facility] 07/05/2015 12:45 PM

## 2015-07-05 NOTE — Unmapped (Signed)
Pt transferred emergently to [facility] ER 2/2 bleeding event. AOx3, extremely anxious. Medicated PRN for pain and anxiety as charted, stated some relief upon reassessment.

## 2015-07-05 NOTE — Unmapped (Signed)
Report called to [facility] ER. Sister by phone that pt is going to [facility] ER.

## 2015-07-05 NOTE — Unmapped (Signed)
Problem: Compromised Skin Integrity  Goal: LTG - Patient will be free from infection  Wounds will heal without signs and symptoms of infection throughout stay tx of abdominal wound with coloplast fistula management device and ostomy pouches.

## 2015-07-05 NOTE — Unmapped (Signed)
Pt leaving with 911 ambulance transport to [facility] hospital ER. Voicemail left for sister.

## 2015-07-05 NOTE — Unmapped (Addendum)
[name]  Nutrition Assessment    Interviewed while wound care team is calling for supplies.  Able to answer questions and knows she in on TPN.  Pain evident in body language. Contact information of unit RDN provided.     Diagnosis: [facility] 06/25/15 dx: sepsis, Abdominal abscess:  Peritoneal abscess Fistula, Severe intractable acute on chronic pain syndrome, Thrombocytopenia, Cachexia, Ischemic feet/arterial thromboemboli, PMH:  Colorectal cancer 2004 s/p pelvic exenteration, perforated adenocarcinoma, Fecal peritonitis with abscess, Encephalopathy, Sepsis, Bilateral ischemic lower extremities, arterial thromboemboli, Abdominal fistula/enterocutaneous fistula, rectovaginal fistulae, Chronic nephrotomies, Cachexia, Chronic pain, Recurrent pre-sacral abscesses, Acute metabolic acidosis, Acute renal failure  ????  Allergies   Allergen Reactions   ??? Compazine [Prochlorperazine Edisylate]    ??? Latex, Natural Rubber        Diet:NPO except medications and ice chips, able to chew medications  Supplement/Tube Feeding/IV: TPN 60 ml/h =1439ml total volume, AA 72 grams, Dextrose 287.7 grams, Lipid 14.4 grams = 1410.18 kcal  Intake: ice chips only  Chewing/Swallowing Problems:has her own teeth   Dining Location: room  Religious/Ethnic/Herbal Food Preferences: none reported  Mental Status: A/O  Feeding Ability:tbd  Bowels: per colostomy    Weight: 90#  (07/04/15) Height: 5' 2 BMI:16.46 kg/(m^2).Usual Weight: 120#  %UBW:75% Goal Weight: Note < 88#    Ideal Weight: 110#  %IBW: 82%  Significant Loss/Gain: Weight down 30# (25%) from usual prior to illness. Actual date not available    Labs:  Lab Results   Component Value Date    ALBUMIN 1.6* 07/05/2015     Lab Results   Component Value Date    ALT 32 07/05/2015    AST 28 07/05/2015    ALKPHOS 89 07/05/2015    BILITOT 0.9 07/05/2015     Lab Results   Component Value Date    PREALBUMIN 14.0* 07/05/2015     Lab Results   Component Value Date    CREATININE 0.53*  07/05/2015    BUN 23 07/05/2015    NA 138 07/05/2015    K 4.2 07/05/2015    CL 110 07/05/2015    CO2 17* 07/05/2015     Lab Results   Component Value Date    OSMOLALITY 287 07/05/2015     Lab Results   Component Value Date    CALCIUM 7.6* 07/05/2015    GLUCOSE 58* 07/05/2015     Lab Results   Component Value Date    WBC 10.8 07/05/2015    HGB 10.1* 07/05/2015    HCT 30.3* 07/05/2015    MCV 86.7 07/05/2015    PLT 80* 07/05/2015     Scheduled Meds:  ??? aspirin  81 mg Oral Daily 0900   ??? clopidogrel  75 mg Oral Daily 0900   ??? famotidine (PF)  20 mg Intravenous BID   ??? [START ON 07/07/2015] fentaNYL  1 patch Transdermal Q72H   ??? fluconazole in NaCl (iso-osm)  200 mg Intravenous Q24H   ??? piperacillin-tazobactam (ZOSYN) IV extended interval  3.375 g Intravenous 3 times per day   ??? vancomycin  750 mg Intravenous Q24H     Continuous Infusions:  ??? TPN ADULT ([facility])       PRN Meds:.acetaminophen, albuterol, HYDROmorphone (PF), LORazepam, ondansetron    Skin Condition: wound care assessment in progress, abdominal wound with E-C fistula, Bilateral nephrostomy tubes, Right toes with gangrene  Edema: LLE +2    Estimated Nutrition Needs:   Kcals/day: 1400-1600  Protein g/day: 60-75  Fluid ml/day:1500  Method  for needs estimation Mifflin-St Jeor formula  based on weight IBW of 50 kg    Nutrition Diagnosis and Problems:   Nutrition Diagnosis: Altered GI function  Related To: E-C fistula  As Evidenced By: need for TPN    Nutrition Prescription:   Food and Nutrition Therapy: Continue TPN as ordered  Nutrition Education: TPN, role and contact information of RDN  Coordination of Nutrition Care: pharmacy, wound care team    Monitoring: Weight, labs and skin condition    Plan of Care:Implemented    Follow Up: 5-7 Days:     Monico Blitz, RDN, LD  Phone  [phone]  Pager [phone]  07/05/2015 8:33 AM

## 2015-07-05 NOTE — Unmapped (Signed)
Medication Reconciliation per Pharmacy    Referring Facility: [facility]   Admission Date: 07/04/2015  8:04 PM     PMH:      ?? Colorectal cancer 2004 s/p pelvic exenteration, perforated adenocarcinoma  ?? Fecal peritonitis with abscess  ?? Encephalopathy  ?? Sepsis  ?? Bilateral ischemic lower extremities, arterial thromboemboli  ?? Abdominal fistula/enterocutaneous fistula, rectovaginal fistulae  ?? Chronic nephrostomies  ?? Cachexia  ?? Chronic pain  ?? Recurrent pre-sacral abscesses  ?? Acute metabolic acidosis  ?? Acute renal failure  ????   Allergies: Compazine and Latex, natural rubber        Recommendations:   None      Medication Discrepancies:  Medication Ordered at Previous Facility Medication Ordered Upon Admission   Famotidine 20 mg IV daily Famotidine 20 mg IV BID   Hydromorphone 0.5-1 mg IV q2h prn pain Hydromorphone 1 mg q2h prn mod-sev pain   Duoneb 3 ml HHN q4h prn Albuterol HHN 2.5 mg q4h prn wheezing, SOB   Lorazepam 1 mg IV q4h prn anxiety Lorazepam 0.5 mg IV q4h prn anxiety                 Pertinent Labs/Vitals:  BP 103/65 mmHg   Pulse 94   Temp(Src) 97.7 ??F (36.5 ??C) (Oral)   Resp 18   Ht 5' 2 (1.575 m)   Wt 90 lb (40.824 kg)   BMI 16.46 kg/m2   SpO2 100%      Thanks,   Antonieta Iba, RPh  Pharmacist-TDDC  07/05/2015 3:49 PM

## 2015-07-05 NOTE — Unmapped (Signed)
Problem: Potential for Compromised Skin Integrity  Goal: Skin integrity is maintained or improved  Assess and monitor skin integrity. Identify patients at risk for skin breakdown on admission and per policy. Collaborate with interdisciplinary team and initiate plans and interventions as needed.  Outcome: Progressing  q2 turn. Monitor drains and pouches for leaking and address q2, keeping skin clean and dry.

## 2015-07-05 NOTE — Unmapped (Signed)
Problem: Physical Therapy  Goal: Instruct PT/Family on Safe Mobility Practices  Instruct Pt/Family on safe mobility practives including bed mobility, transfer training, w/c mobility training, and gait training when appropriate.    Pt Will Go Supine To Sit: Modified Independent (to get in and out bed within 6 weeks. )  Sit To Stand: Min A with LRAD to prepare for gait and transfers within 6 weeks.    Pt Will Transfer Bed/Chair: Minimal (Min A with LRAD to facilitate (I) in home within 6 weeks. )  Pt Will Ambulate: Minimal (25??? with LRAD to access home within 6 weeks. )  Miscellaneous Goal #1: Pt will be independent with HEP to improve B LE strength to facilitate safe mobility in 6 weeks.    Miscellaneous Goal #2: Pt will demonstrate improved safe mobility to return home as demonstrated by improved Elderly Mobility Scale outcome of > 10/20.  Long Term Goal : Pt will negotiate 12 steps with 1 rail and LRAD to enter and exit home with min A in 8 weeks.     Initial Status: PT DECLINED MOBILITY; To be assessed   Outcome: Progressing  Interventions: LE strengthening/ROM, endurance training, patient/family training, gait training, neuromuscular reeducation, stair negotiation, therapeutic activity, therapeutic exercise    Pt to be seen 1-3 x/week for 8 weeks ( 09/03/15). Please refer to treatment notes for patient's current status.     Rulon Abide, PT, DPT [phone]

## 2015-07-05 NOTE — Unmapped (Signed)
Patient was admitted last by stretcher from [facility] accompanied with her sister. Patient complained of severe pain to her upper back which is increased with movement. Vitals was assessed and stable. No signs of respiratory distress noted. Bilateral lung sounds diminished. Patient has a triple lumen right internal jugular} and right port cath that is accessed. All lines has blood return and flushes without difficulty. Foley cath intact. Bilateral nephrostomy drains are patent and giving output. Right lower quadrant ostomy draining clear fluids. Abdominal fistula remains intact. 2+ pitting edema present to left foot. Patient received PRN pain medication three times through out the night. Antibiotics given as ordered without adverse effects. No other concerns noted at this time.

## 2015-07-05 NOTE — Unmapped (Signed)
Occupational Therapy   Reason Patient Not Seen         Name: Linda Ponce  DOB: 1970-10-17  Attending Physician: Norma Fredrickson R*  Admission Diagnosis: [facility] 06/25/15 dx: sepsis  Date: 07/05/2015  Reviewed Pertinent hospital course: Yes    Received orders and chart reviewed. Initial attempt, patient sleeping soundly and unable to awaken patient. Second attempt with PT patient became tearful and was not willing to try moving even at bedlevel. Patient stated that she was just getting comfortable and that she wants to adjust to her new surroundings. Stated that she will participate as able on Monday, 07/08/15.    Amy Cherre Blanc, OTR/L [phone]  LTAC at the [facility]  Tues., Wed. and Friday 9:00-5:30  Pager #: [phone]    Unable to see patient due to :Pain

## 2015-07-05 NOTE — Progress Notes (Signed)
MD advised of H/H, blood orders received/released

## 2015-07-05 NOTE — Other (Addendum)
Patient Acct Nbr:  [id number]  Primary AUTH/CERT:    [place] Name:   [employer]/HMO  Primary Insurance Plan Name:  [place] HMO/POS  Primary Insurance Group Number:    Primary Insurance Plan Type: [employer] Insurance Policy Number:  [id number]

## 2015-07-05 NOTE — H&P (Addendum)
General Surgery   Resident Consult Note    Reason for Consult: Extensive past surgical history with unstable vital signs     History of Present Illness:    Erica Harding is a 45 y.o. female with complex past medical and surgical history including modified posterior pelvic exenteration and HIPEC in 2011 for what turned out to be a perforated rectal adenocarcinoma. Her operative course was complicated by a rectovaginal fistula with recurrent pre-sacral abscesses and urological complications which required bilateral nephrostomy tubes.  She was discharged to day to [facility].  However, when [facility] staff was changing her midline dressings they noted a large amount of blood and became hypotensive.  She was brought via ambulance to [facility] Lab work demonstrated leukocytosis and CT scan showed multiple abdominal abscesses with large pelvic abscess .  Her Hgb was 6.8 in ED and she was transfused 1 unit of blood.       Past Medical History:        Diagnosis Date   ??? Colorectal cancer [facility])        Past Surgical History:           Procedure Laterality Date   ??? Colonoscopy     ??? Abdomen surgery     ??? Hysterectomy         Allergies:  Latex and Compazine [prochlorperazine maleate]    Medications:   Home Meds  No current facility-administered medications on file prior to encounter.      Current Outpatient Prescriptions on File Prior to Encounter   Medication Sig Dispense Refill   ??? fentaNYL (DURAGESIC) 25 MCG/HR Place 1 patch onto the skin every 72 hours . 10 patch 0   ??? HYDROmorphone (DILAUDID) 1 MG/ML injection Infuse 1 mL intravenously every 2 hours as needed for Pain . 1 mL 0   ??? acetaminophen (OFIRMEV) 10 MG/ML SOLN infusion Infuse 74.1 mLs intravenously every 6 hours as needed for Fever or Pain 4000 mL 0   ??? aspirin 81 MG chewable tablet 1 tablet by Per NG tube route daily 30 tablet 3   ??? LORazepam (ATIVAN) 2 MG/ML injection Infuse 0.5 mLs intravenously every 4 hours as needed (anxiety) 1 mL 0   ??? ipratropium-albuterol (DUONEB) 0.5-2.5  (3) MG/3ML SOLN nebulizer solution Inhale 3 mLs into the lungs every 4 hours as needed for Shortness of Breath 360 mL 0   ??? glucose (GLUTOSE) 40 % GEL Take 15 g by mouth as needed (glc<70) 45 g 1   ??? insulin lispro (HUMALOG) 100 UNIT/ML pen Inject 0-6 Units into the skin every 4 hours 5 Pen 3   ??? ondansetron (ZOFRAN) 4 MG/2ML injection Infuse 2 mLs intravenously every 6 hours as needed for Nausea 15 mL 0   ??? fluconazole (DIFLUCAN) 200MG /100ML IVPB Infuse 100 mLs intravenously every 24 hours 100 mL 0   ??? magnesium sulfate 10-5 MG/ML-% Infuse 100 mLs intravenously as needed (Per IV Magnesium Replacement Protocol) 100 mL 0   ??? potassium chloride 20 MEQ/50ML IVPB Infuse 50 mLs intravenously as needed (Per IV Potassium Replacement Protocol) 50 mL 0   ??? vancomycin (VANCOCIN) 500 MG/100ML IVPB Infuse 100 mLs intravenously every 24 hours 100 mL 0   ??? clopidogrel (PLAVIX) 75 MG tablet 1 tablet by Per NG tube route daily 30 tablet 3   ??? fat emulsion 20 % infusion Infuse 250 mLs intravenously twice a week 250 mL 0   ??? famotidine (PEPCID) 20 MG/2ML injection Infuse 2 mLs intravenously daily 2 mL 0  Current Meds    0.9 % sodium chloride bolus Once   ondansetron (ZOFRAN) 4 MG/2ML injection    oxidized cellulose (SURGICEL) external pads    piperacillin-tazobactam (ZOSYN) 3.375 g in dextrose 5 % 100 mL IVPB (mini-bag) Once   vancomycin (VANCOCIN) 1,000 mg in dextrose 5 % 250 mL IVPB Once   sodium chloride flush 0.9 % injection 10 mL 2 times per day   sodium chloride flush 0.9 % injection 10 mL PRN   [START ON 07/06/2015] enoxaparin (LOVENOX) injection 40 mg Daily   lactated ringers infusion Continuous   ondansetron (ZOFRAN) injection 4 mg Q6H PRN   piperacillin-tazobactam (ZOSYN) 3.375 g in dextrose 5 % 100 mL IVPB extended infusion (mini-bag) Q8H   HYDROmorphone (DILAUDID) injection 0.25 mg Q3H PRN   Or    HYDROmorphone (DILAUDID) injection 0.5 mg Q3H PRN       Family History:   History reviewed. No pertinent family  history.    Social History:   TOBACCO:   reports that she has never smoked. She does not have any smokeless tobacco history on file.  ETOH:   reports that she does not drink alcohol.  DRUGS:   reports that she does not use illicit drugs.    ROS: A 10 point review of systems was conducted, significant findings as noted in HPI.    Physical exam:       Vitals:    07/05/15 2110   BP: 107/86   Pulse: 110   Resp: 13   Temp: 97.7 ??F (36.5 ??C)   SpO2: 100%       General appearance: alert, resting in bed  Neuro: Alert and oriented x 3, sensory-motor grossly intact   HEENT: mucous membranes are moist, no icterus   Lungs: Clear to auscultation bilaterally, no wheezing, no rhonchi or rales    Heart: tachy  Abdomen: multiple drains, nephrostomy tubes, large clear dressing on midline wound (~ 3 cm x 3 cm area of exposed bowel in midline with fistulas), edge of abdominal wound with active bleeding   Skin: pale  Extremities: mild edema    Labs:    CBC: Recent Labs      07/03/15   0603  07/04/15   0650  07/05/15   1759   WBC  7.3  9.3  16.1*   HGB  11.7*  10.0*  6.8*   HCT  35.2*  30.3*  21.5*   MCV  86.8  88.0  90.6   PLT  53*  63*  99*     BMP: Recent Labs      07/03/15   0603  07/04/15   0650  07/04/15   1810  07/05/15   1808   NA  133*  135*   --    --    K  4.0  3.4*  4.3   --    CL  107  106   --    --    CO2  16*  17*   --   13*   PHOS  2.6  2.5   --    --    BUN  22*  22*   --    --    CREATININE  0.6  0.5*   --   0.5*     PT/INR:   Recent Labs      07/05/15   1755   PROTIME  18.2*   INR  1.61*     APTT:   Recent  Labs      07/05/15   1755   APTT  37.3*     Liver Profile:  Lab Results   Component Value Date    AST 23 07/05/2015    ALT 25 07/05/2015    BILIDIR <0.2 07/05/2015    BILITOT 0.4 07/05/2015    ALKPHOS 77 07/05/2015   No results found for: CHOL, HDL, TRIG  UA: Lab Results   Component Value Date    COLORU Not Entered 07/05/2015    PHUR 8.5 07/05/2015    WBCUA 6-10 07/05/2015    RBCUA >100 07/05/2015    YEAST Present  07/05/2015    BACTERIA 4+ 07/05/2015    CLARITYU Not Entered 07/05/2015    SPECGRAV 1.025 07/05/2015    LEUKOCYTESUR LARGE 07/05/2015    UROBILINOGEN 0.2 07/05/2015    BILIRUBINUR Negative 07/05/2015    BLOODU LARGE 07/05/2015    GLUCOSEU Negative 07/05/2015       Imaging:   CT ABDOMEN PELVIS W IV CONTRAST Additional Contrast? None   Final Result   1. Multiple abscess, large pelvic abscess with surgical drain,   right issue rectal fossa.   2. 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   3. Granulation tissue enhancement or blood extravasation in the   lower abdominal wall as described   4. Proximity of the small bowel vasculature at the level of the   open wound on the right, side of active bleeding   5. Lower lobe bronchopneumonia and aspiration is also considered      Critical findings reviewed with Dr. Junius Argyle and resident surgical   staff at 8:15pm, 8:45 and 9:15pm on 07/05/2015         XR Chest Portable   Final Result   1. No acute cardiopulmonary abnormalities.   2. Lines in stable position                Assessment/Plan:  45 y.o. 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  Transfuse for hgb < 7  - IVF ressusitiation  - NPO  - Will start Vanc and Zosyn  - F/u blood cultures   - Will start TPN tomorrow, as pt as been on TPN at prior facility.  Will monitor for hypoglycemia and treat accordingly if needed.       Helene Kelp, MD  07/05/2015 10:43 PM  [phone]    I agree with the residents interval history and examination findings as well as the assessment and plan.    Brydan Downard [employer]

## 2015-07-05 NOTE — Progress Notes (Addendum)
Patient admitted to 4503 from ED for hypotension, abd pain, and bleeding from abd incision. A/o x4, responds appropriately. Patient with history of extensive abd surgery; midline abd open incision with collection device in place draining serosanguinous drainage to bedside bag. There is a open wound to the left abd with an ostomy pouch in place draining thin brown/tan drainage. Ostomy to RLQ, stoma moist/pink, no output at this time. LLQ perc drain in place, clamped at this time Bilat nephrostomy tubes draining clear/amber urine. A foley cath is in place but no urine present. Bilat elbow abrasions with mepilex covering.  Wound to coccyx, sacral heart placed. Right toes with necrotic tissue at tips, no sensation, pulses found with doppler. Medicated for pain per MAR. Patient noted to have 16mcg fentanyl patch to right arm placed on 07/04/15. Sister at bedside, updated on POC. Oriented to room and call light.    Ivor Messier, RN

## 2015-07-05 NOTE — Consults (Signed)
Pharmacy to dose Vancomycin per Dr. Carlyle Dolly.  Indication: empiric therapy    BUN/CR 19/0.5    Wt = 49.4 kg Ht = 62 inches  Estimate CrCl = 96 ml/min    One time dose of Vancomycin 1000 mg ordered for ED administration. If further dosing needed, please re-consult pharmacy.    Erica Harding [facility] 07/05/2015 8:23 PM

## 2015-07-05 NOTE — ED Provider Notes (Addendum)
Chief Complaint   Post-op Problem      Nursing Notes, Past Medical Hx, Past Surgical Hx, Social Hx, Allergies, and Family Hx were all reviewed and agreed with, or any disagreements were addressed in the HPI.    History of Present Illness     Erica Harding is a 45 y.o. female who presents with Severe abdominal pain and hypotension.  This patient was discharged yesterday from [facility] to [facility]..  She had severe sepsis due to peritonitis and had surgery on May 30 2005 at [facility]She currently has bleeding from her abdominal wounds with fistulas.  She presented here with a blood pressure in the 80s.  She appears very pale is complaining of 10 out of 10 severe abdominal pain.   Review of Systems     A 10 point review of systems was obtained. No significant history other than as noted in HPI and as above.    Nursing notes reviewed.      Past Medical, Surgical, Family, and Social History     She has a past medical history of Colorectal cancer ([place]).  She has a past surgical history that includes Colonoscopy; Abdomen surgery; and Hysterectomy.  Her family history is not on file.  She reports that she has never smoked. She does not have any smokeless tobacco history on file. She reports that she does not drink alcohol or use illicit drugs.    Medications     Previous Medications    ACETAMINOPHEN (OFIRMEV) 10 MG/ML SOLN INFUSION    Infuse 74.1 mLs intravenously every 6 hours as needed for Fever or Pain    ASPIRIN 81 MG CHEWABLE TABLET    1 tablet by Per NG tube route daily    CLOPIDOGREL (PLAVIX) 75 MG TABLET    1 tablet by Per NG tube route daily    FAMOTIDINE (PEPCID) 20 MG/2ML INJECTION    Infuse 2 mLs intravenously daily    FAT EMULSION 20 % INFUSION    Infuse 250 mLs intravenously twice a week    FENTANYL (DURAGESIC) 25 MCG/HR    Place 1 patch onto the skin every 72 hours .    FLUCONAZOLE (DIFLUCAN) 200MG /100ML IVPB    Infuse 100 mLs intravenously every 24 hours    GLUCOSE (GLUTOSE) 40 %  GEL    Take 15 g by mouth as needed (glc<70)    HYDROMORPHONE (DILAUDID) 1 MG/ML INJECTION    Infuse 1 mL intravenously every 2 hours as needed for Pain .    INSULIN LISPRO (HUMALOG) 100 UNIT/ML PEN    Inject 0-6 Units into the skin every 4 hours    IPRATROPIUM-ALBUTEROL (DUONEB) 0.5-2.5 (3) MG/3ML SOLN NEBULIZER SOLUTION    Inhale 3 mLs into the lungs every 4 hours as needed for Shortness of Breath    LORAZEPAM (ATIVAN) 2 MG/ML INJECTION    Infuse 0.5 mLs intravenously every 4 hours as needed (anxiety)    MAGNESIUM SULFATE 10-5 MG/ML-%    Infuse 100 mLs intravenously as needed (Per IV Magnesium Replacement Protocol)    ONDANSETRON (ZOFRAN) 4 MG/2ML INJECTION    Infuse 2 mLs intravenously every 6 hours as needed for Nausea    POTASSIUM CHLORIDE 20 MEQ/50ML IVPB    Infuse 50 mLs intravenously as needed (Per IV Potassium Replacement Protocol)    VANCOMYCIN (VANCOCIN) 500 MG/100ML IVPB    Infuse 100 mLs intravenously every 24 hours       Allergies     She is allergic to latex  and compazine [prochlorperazine maleate].    Physical Exam     INITIAL VITALS:   Visit Vitals   ??? BP 113/63   ??? Pulse 124   ??? Temp 97.6 ??F (36.4 ??C)   ??? Resp 18   ??? Wt 109 lb (49.4 kg)   ??? SpO2 100%   ??? BMI 19.94 kg/m2      General: Well-developed well-nourished in no acute distress.  HEENT: Head normocephalic atraumatic.  Pupils equal and round.  External ears and nose normal.  No cervical lymphadenopathy.  Neck: Full range of motion, supple.  Pulmonary: Lungs clear to auscultation bilaterally.  No wheezing, rhonchi, or rales.  Cardiac: Regular rate and rhythm.  No murmurs, rubs, or gallops.  Clear S1, S2.  Abdomen: multiple abdominal fistulas with a large dressing and patient has a drainage tube which is draining dark red blood  Musculoskeletal: Moving all extremities appropriately with full range of motion.  No obvious weakness noted.  No pitting edema noted.  Vascular: Palpable pulses to all extremities.  Skin: Warm and dry.  Neuro: Alert and  oriented.  Cranial nerves II through XII without deficit.    Psych:  Normal affect, Normal judgement, Normal mood, affect and behavior.      Diagnostic Results             RADIOLOGY:  XR Chest Portable   Final Result   1. No acute cardiopulmonary abnormalities.   2. Lines in stable position         CT ABDOMEN PELVIS W IV CONTRAST Additional Contrast? None    (Results Pending)         LABS:   Labs Reviewed   CBC WITH AUTO DIFFERENTIAL - Abnormal; Notable for the following:        Result Value    WBC 16.1 (*)     RBC 2.37 (*)     Hemoglobin 6.8 (*)     Hematocrit 21.5 (*)     RDW 17.5 (*)     Platelets 99 (*)     Neutrophils # 10.6 (*)     All other components within normal limits    Narrative:     CALL  Ward  SJEQJ tel. [phone],  Hematology results called to and read back by [facility], 07/05/2015 18:43,  by Kimball - Abnormal; Notable for the following:     Total Protein 3.4 (*)     Alb 0.8 (*)     All other components within normal limits   MICROSCOPIC URINALYSIS - Abnormal; Notable for the following:     WBC, UA 6-10 (*)     RBC, UA >100 (*)     Bacteria, UA 4+ (*)     Yeast, UA Present (*)     All other components within normal limits   POCT VENOUS - Abnormal; Notable for the following:     POC Chloride 112 (*)     CO2 13 (*)     POC Glucose 119 (*)     POC BUN 19 (*)     POC Creatinine 0.5 (*)     All other components within normal limits   POCT VENOUS - Abnormal; Notable for the following:     pCO2, Ven 23.2 (*)     HCO3, Venous 12.5 (*)     Base Excess, Ven -13 (*)     Lactate 4.30 (*)     All other components within normal limits  POC URINE WITH MICROSCOPIC - Abnormal; Notable for the following:     Blood, Urine LARGE (*)     pH, UA 8.5 (*)     Protein, UA >=300 (*)     Leukocyte Esterase, Urine LARGE (*)     All other components within normal limits   POCT VENOUS - Abnormal; Notable for the following:     BNP 498.0 (*)     All other components within normal limits   URINE CULTURE    CULTURE BLOOD #2   CULTURE BLOOD #1   LIPASE   PROTIME-INR   APTT   POC URINE WITH MICROSCOPIC   POCT CHEM BASIC W ICA   POCT HEMOGLOBIN   POCT LACTIC ACID (LACTATE)   POCT TROPONIN   POCT BLOOD GASES   POCT B-TYPE NATRIURETIC PEPTIDE (BNP)   POC URINE WITH MICROSCOPIC   POCT VENOUS   PREPARE RBC (CROSSMATCH)   ABO/RH   TYPE AND SCREEN           RECENT VITALS:  BP: 113/63, Temp: 97.6 ??F (36.4 ??C), Pulse: 124, Resp: 18     Procedures         ED Course     The patient was given the following medications:  Orders Placed This Encounter   Medications   ??? sodium chloride 0.9 % infusion     MOSLEY, JESSICA: cabinet override   ??? lactated ringers infusion 1,000 mL   ??? lactated ringers infusion     MOSLEY, JESSICA: cabinet override   ??? 0.9 % sodium chloride bolus   ??? ondansetron (ZOFRAN) 4 MG/2ML injection     CONDIT, MEGAN: cabinet override   ??? ketamine (KETALAR) injection 5 mg   ??? oxidized cellulose (SURGICEL) external pads     ZIMMERMAN, KRISTEN: cabinet override   ??? piperacillin-tazobactam (ZOSYN) 3.375 g in dextrose 5 % 100 mL IVPB (mini-bag)   ??? iopamidol (ISOVUE-370) 76 % injection 80 mL         CONSULTS:  [place]     Erica Harding is a 45 y.o. female who presents with hypotension and anemia.  Patient recently discharge from [facility] and currently at [facility].  I contacted the surgeon on call at [facility] and he asked that I call our general surgery service here.  Patient given IV fluids and I ordered 1 unit of packed red cells.  Also ordered vancomycin and Zosyn.  Blood cultures were obtained and patient will be admitted by the surgery service.    Critical Care:  Due to the immediate potential for life-threatening deterioration due to hypotension and anemia, I spent 32 minutes providing critical care.  This time is excluding time spent performing procedures.    Clinical Impression      Anemia  Hypotension    Disposition/Plan     PATIENT REFERRED TO:  No follow-up provider specified.    DISCHARGE MEDICATIONS:  New Prescriptions    No medications on file       DISPOSITION patient admitted by the surgery service.    (Please note that portions of this note were completed with voice recognition software.  Efforts were made to edit the dictations but occasionally words are mis-transcribed.)           Smitty Cords, MD  07/05/15 1831       Smitty Cords, MD  07/08/15 1314

## 2015-07-05 NOTE — ED Notes (Signed)
Lab called to draw blood cultures.       Orie Rout, RN  07/05/15 2124

## 2015-07-05 NOTE — ED Notes (Signed)
Report given to ICU RN's at bedside in ED.  Patient transported on tele by RN's with family in stable condition at time of transfer.       Orie Rout, RN  07/05/15 2142

## 2015-07-05 NOTE — ED Notes (Signed)
Patient to CT via stretcher.       Orie Rout, RN  07/05/15 [phone]

## 2015-07-06 LAB — POCT GLUCOSE
POC Glucose: 64 mg/dl — ABNORMAL LOW (ref 70–99)
POC Glucose: 220 mg/dl — ABNORMAL HIGH (ref 70–99)
POC Glucose: 143 mg/dl — ABNORMAL HIGH (ref 70–99)
POC Glucose: 135 mg/dl — ABNORMAL HIGH (ref 70–99)

## 2015-07-06 LAB — LACTIC ACID
Lactic Acid: 2.9 mmol/L — ABNORMAL HIGH (ref 0.4–2.0)
Lactic Acid: 2.1 mmol/L — ABNORMAL HIGH (ref 0.4–2.0)

## 2015-07-06 LAB — MAGNESIUM
Magnesium: 1.6 mg/dL — ABNORMAL LOW (ref 1.80–2.40)
Magnesium: 1.8 mg/dL (ref 1.80–2.40)

## 2015-07-06 LAB — RENAL FUNCTION PANEL
Albumin: 0.7 g/dL — ABNORMAL LOW (ref 3.4–5.0)
Anion Gap: 10 (ref 3–16)
BUN: 20 mg/dL (ref 7–20)
CO2: 16 mmol/L — ABNORMAL LOW (ref 21–32)
Calcium: 6.8 mg/dL — ABNORMAL LOW (ref 8.3–10.6)
Chloride: 111 mmol/L — ABNORMAL HIGH (ref 99–110)
Creatinine: 0.5 mg/dL — ABNORMAL LOW (ref 0.6–1.1)
GFR African American: >60 (ref >60)
GFR Non-African American: >60 (ref >60)
Glucose: 207 mg/dL — ABNORMAL HIGH (ref 70–99)
Phosphorus: 2.6 mg/dL (ref 2.5–4.9)
Potassium: 3.3 mmol/L — ABNORMAL LOW (ref 3.5–5.1)
Sodium: 137 mmol/L (ref 136–145)

## 2015-07-06 LAB — POCT VENOUS: Lactate: 2.02 mmol/L — ABNORMAL HIGH (ref 0.40–2.00)

## 2015-07-06 LAB — EKG 12-LEAD
Atrial Rate: 107 {beats}/min
P Axis: 71 degrees
P-R Interval: 124 ms
Q-T Interval: 344 ms
QRS Duration: 74 ms
QTc Calculation (Bazett): 459 ms
R Axis: 64 degrees
T Axis: -88 degrees
Ventricular Rate: 107 {beats}/min

## 2015-07-06 LAB — CBC WITH AUTO DIFFERENTIAL
Basophils %: 0 %
Basophils %: 0.1 %
Basophils Absolute: 0 10*3/uL (ref 0.0–0.2)
Basophils Absolute: 0 10*3/uL (ref 0.0–0.2)
Eosinophils %: 0 %
Eosinophils %: 0 %
Eosinophils Absolute: 0 10*3/uL (ref 0.0–0.6)
Eosinophils Absolute: 0 10*3/uL (ref 0.0–0.6)
Hematocrit: 31 % — ABNORMAL LOW (ref 36.0–48.0)
Hematocrit: 22.2 % — ABNORMAL LOW (ref 36.0–48.0)
Hemoglobin: 10.2 g/dL — ABNORMAL LOW (ref 12.0–16.0)
Hemoglobin: 7.5 g/dL — ABNORMAL LOW (ref 12.0–16.0)
Lymphocytes %: 27 %
Lymphocytes %: 25.4 %
Lymphocytes Absolute: 5.3 10*3/uL — ABNORMAL HIGH (ref 1.0–5.1)
Lymphocytes Absolute: 2.6 10*3/uL (ref 1.0–5.1)
MCH: 29.2 pg (ref 26.0–34.0)
MCH: 29.5 pg (ref 26.0–34.0)
MCHC: 33 g/dL (ref 31.0–36.0)
MCHC: 33.9 g/dL (ref 31.0–36.0)
MCV: 88.5 fL (ref 80.0–100.0)
MCV: 87.1 fL (ref 80.0–100.0)
MPV: 10.9 fL — ABNORMAL HIGH (ref 5.0–10.5)
MPV: 9.5 fL (ref 5.0–10.5)
Monocytes %: 6 %
Monocytes %: 8.3 %
Monocytes Absolute: 1.2 10*3/uL (ref 0.0–1.3)
Monocytes Absolute: 0.9 10*3/uL (ref 0.0–1.3)
Neutrophils %: 67 %
Neutrophils %: 66.2 %
Neutrophils Absolute: 13.1 10*3/uL — ABNORMAL HIGH (ref 1.7–7.7)
Neutrophils Absolute: 6.9 10*3/uL (ref 1.7–7.7)
PLATELET SLIDE REVIEW: DECREASED
Platelets: 70 10*3/uL — ABNORMAL LOW (ref 135–450)
Platelets: 54 10*3/uL — ABNORMAL LOW (ref 135–450)
RBC: 3.5 M/uL — ABNORMAL LOW (ref 4.00–5.20)
RBC: 2.55 M/uL — ABNORMAL LOW (ref 4.00–5.20)
RDW: 15.8 % — ABNORMAL HIGH (ref 12.4–15.4)
RDW: 15.5 % — ABNORMAL HIGH (ref 12.4–15.4)
WBC: 19.6 10*3/uL — ABNORMAL HIGH (ref 4.0–11.0)
WBC: 10.4 10*3/uL (ref 4.0–11.0)

## 2015-07-06 LAB — PROTIME-INR
INR: 1.61 — ABNORMAL HIGH (ref 0.85–1.15)
INR: 1.65 — ABNORMAL HIGH (ref 0.85–1.15)
Protime: 18.2 s — ABNORMAL HIGH (ref 9.6–13.0)
Protime: 18.6 s — ABNORMAL HIGH (ref 9.6–13.0)

## 2015-07-06 LAB — BASIC METABOLIC PANEL
Anion Gap: 12 (ref 3–16)
BUN: 22 mg/dL — ABNORMAL HIGH (ref 7–20)
CO2: 15 mmol/L — ABNORMAL LOW (ref 21–32)
Calcium: 7.3 mg/dL — ABNORMAL LOW (ref 8.3–10.6)
Chloride: 108 mmol/L (ref 99–110)
Creatinine: 0.5 mg/dL — ABNORMAL LOW (ref 0.6–1.1)
GFR African American: >60 (ref >60)
GFR Non-African American: >60 (ref >60)
Glucose: 79 mg/dL (ref 70–99)
Potassium: 4.1 mmol/L (ref 3.5–5.1)
Sodium: 135 mmol/L — ABNORMAL LOW (ref 136–145)

## 2015-07-06 LAB — APTT: aPTT: 37.3 s — ABNORMAL HIGH (ref 21.0–31.8)

## 2015-07-06 LAB — TROPONIN
Troponin: 0.06 ng/mL — ABNORMAL HIGH (ref <0.01)
Troponin: 0.05 ng/mL — ABNORMAL HIGH (ref <0.01)
Troponin: 0.05 ng/mL — ABNORMAL HIGH (ref <0.01)

## 2015-07-06 LAB — CULTURE, URINE: Urine Culture, Routine: >50000

## 2015-07-06 LAB — PHOSPHORUS: Phosphorus: 3.4 mg/dL (ref 2.5–4.9)

## 2015-07-06 MED ORDER — POTASSIUM CHLORIDE 20 MEQ/50ML IV SOLN
20 MEQ/50ML | INTRAVENOUS | Status: AC
Start: 2015-07-06 — End: 2015-07-06
  Administered 2015-07-06 (×4): 20 meq via INTRAVENOUS

## 2015-07-06 MED ORDER — DEXTROSE 50 % IV SOLN
50 % | INTRAVENOUS | Status: AC
Start: 2015-07-06 — End: 2015-07-06

## 2015-07-06 MED ORDER — ONDANSETRON HCL 4 MG/2ML IJ SOLN
4 | Freq: Four times a day (QID) | INTRAMUSCULAR | Status: DC | PRN
Start: 2015-07-06 — End: 2015-07-08

## 2015-07-06 MED ORDER — HYDROMORPHONE HCL 1 MG/ML IJ SOLN
1 MG/ML | INTRAMUSCULAR | Status: DC | PRN
Start: 2015-07-06 — End: 2015-07-06

## 2015-07-06 MED ORDER — MAGNESIUM SULFATE IN D5W 10-5 MG/ML-% IV SOLN
10-5 MG/ML-% | Freq: Once | INTRAVENOUS | Status: AC
Start: 2015-07-06 — End: 2015-07-06
  Administered 2015-07-06: 05:00:00 1 g via INTRAVENOUS

## 2015-07-06 MED ORDER — LACTATED RINGERS IV SOLN
INTRAVENOUS | Status: DC
Start: 2015-07-06 — End: 2015-07-05
  Administered 2015-07-06: 02:00:00 via INTRAVENOUS

## 2015-07-06 MED ORDER — DEXTROSE 50 % IV SOLN
50 % | INTRAVENOUS | Status: AC
Start: 2015-07-06 — End: 2015-07-06
  Administered 2015-07-06: 05:00:00 50 via INTRAVENOUS

## 2015-07-06 MED ORDER — LACTATED RINGERS IV BOLUS
Freq: Once | INTRAVENOUS | Status: DC
Start: 2015-07-06 — End: 2015-07-06

## 2015-07-06 MED ORDER — MORPHINE SULFATE (PF) 4 MG/ML IV SOLN
4 | INTRAVENOUS | Status: DC | PRN
Start: 2015-07-06 — End: 2015-07-05

## 2015-07-06 MED ORDER — DEXTROSE 5 % IV SOLN
5 % | Freq: Two times a day (BID) | INTRAVENOUS | Status: DC
Start: 2015-07-06 — End: 2015-07-08
  Administered 2015-07-06 – 2015-07-08 (×4): 750 mg via INTRAVENOUS

## 2015-07-06 MED ORDER — CALCIUM GLUCONATE 10 % IV SOLN
10 % | Freq: Once | INTRAVENOUS | Status: AC
Start: 2015-07-06 — End: 2015-07-06
  Administered 2015-07-06: 12:00:00 1 g via INTRAVENOUS

## 2015-07-06 MED ORDER — MORPHINE SULFATE (PF) 2 MG/ML IV SOLN
2 | INTRAVENOUS | Status: DC | PRN
Start: 2015-07-06 — End: 2015-07-05

## 2015-07-06 MED ORDER — CALCIUM GLUCONATE 10 % IV SOLN
10 % | INTRAVENOUS | Status: AC
Start: 2015-07-06 — End: 2015-07-07
  Administered 2015-07-06: 19:00:00 via INTRAVENOUS

## 2015-07-06 MED ORDER — LACTATED RINGERS IV SOLN
INTRAVENOUS | Status: AC
Start: 2015-07-06 — End: 2015-07-06
  Administered 2015-07-06: 05:00:00 500 via INTRAVENOUS

## 2015-07-06 MED ORDER — MUPIROCIN 2 % EX OINT
2 % | Freq: Two times a day (BID) | CUTANEOUS | Status: DC
Start: 2015-07-06 — End: 2015-07-08
  Administered 2015-07-06 – 2015-07-08 (×5): 1 g via NASAL

## 2015-07-06 MED ORDER — DEXTROSE 50 % IV SOLN
50 % | Freq: Once | INTRAVENOUS | Status: AC
Start: 2015-07-06 — End: 2015-07-06

## 2015-07-06 MED ORDER — LACTATED RINGERS IV BOLUS
Freq: Once | INTRAVENOUS | Status: AC
Start: 2015-07-06 — End: 2015-07-06

## 2015-07-06 MED ORDER — PIPERACILLIN SOD-TAZOBACTAM SO 3.375 (3-0.375) G IV SOLR
3.3753-0.375 (3-0.375) g | Freq: Three times a day (TID) | INTRAVENOUS | Status: DC
Start: 2015-07-06 — End: 2015-07-06

## 2015-07-06 MED ORDER — VANCOMYCIN HCL 1000 MG IV SOLR
1000 MG | Freq: Once | INTRAVENOUS | Status: AC
Start: 2015-07-06 — End: 2015-07-06
  Administered 2015-07-06: 07:00:00 1000 mg via INTRAVENOUS

## 2015-07-06 MED ORDER — HYDROMORPHONE HCL 1 MG/ML IJ SOLN
1 MG/ML | INTRAMUSCULAR | Status: DC | PRN
Start: 2015-07-06 — End: 2015-07-06
  Administered 2015-07-06 (×4): 0.5 mg via INTRAVENOUS

## 2015-07-06 MED ORDER — PIPERACILLIN SOD-TAZOBACTAM SO 3.375 (3-0.375) G IV SOLR
3.3753-0.375 (3-0.375) g | Freq: Three times a day (TID) | INTRAVENOUS | Status: DC
Start: 2015-07-06 — End: 2015-07-08
  Administered 2015-07-06 – 2015-07-08 (×7): 3.375 g via INTRAVENOUS

## 2015-07-06 MED ORDER — HYDROMORPHONE HCL 1 MG/ML IJ SOLN
1 MG/ML | INTRAMUSCULAR | Status: DC | PRN
Start: 2015-07-06 — End: 2015-07-08

## 2015-07-06 MED ORDER — NORMAL SALINE FLUSH 0.9 % IV SOLN
0.9 | INTRAVENOUS | Status: DC | PRN
Start: 2015-07-06 — End: 2015-07-08

## 2015-07-06 MED ORDER — HYDROMORPHONE HCL 1 MG/ML IJ SOLN
1 MG/ML | INTRAMUSCULAR | Status: AC
Start: 2015-07-06 — End: 2015-07-05
  Administered 2015-07-06: 02:00:00 0.5 via INTRAVENOUS

## 2015-07-06 MED ORDER — NORMAL SALINE FLUSH 0.9 % IV SOLN
0.9 | Freq: Two times a day (BID) | INTRAVENOUS | Status: DC
Start: 2015-07-06 — End: 2015-07-08
  Administered 2015-07-06 – 2015-07-08 (×6): 10 mL via INTRAVENOUS

## 2015-07-06 MED ORDER — ENOXAPARIN SODIUM 40 MG/0.4ML SC SOLN
40 | Freq: Every day | SUBCUTANEOUS | Status: DC
Start: 2015-07-06 — End: 2015-07-08
  Administered 2015-07-07 – 2015-07-08 (×2): 40 mg via SUBCUTANEOUS

## 2015-07-06 MED ORDER — HYDROMORPHONE HCL 1 MG/ML IJ SOLN
1 MG/ML | INTRAMUSCULAR | Status: DC | PRN
Start: 2015-07-06 — End: 2015-07-08
  Administered 2015-07-06 – 2015-07-08 (×16): 1 mg via INTRAVENOUS

## 2015-07-06 MED ORDER — MAGNESIUM SULFATE IN D5W 10-5 MG/ML-% IV SOLN
10-5 MG/ML-% | Freq: Once | INTRAVENOUS | Status: AC
Start: 2015-07-06 — End: 2015-07-06
  Administered 2015-07-06: 12:00:00 1 g via INTRAVENOUS

## 2015-07-06 MED ORDER — DEXTROSE-NACL 5-0.9 % IV SOLN
INTRAVENOUS | Status: DC
Start: 2015-07-06 — End: 2015-07-07
  Administered 2015-07-06 – 2015-07-07 (×4): via INTRAVENOUS

## 2015-07-06 MED FILL — VANCOMYCIN HCL 1000 MG IV SOLR: 1000 MG | INTRAVENOUS | Qty: 750

## 2015-07-06 MED FILL — POTASSIUM CHLORIDE 20 MEQ/50ML IV SOLN: 20 MEQ/50ML | INTRAVENOUS | Qty: 200

## 2015-07-06 MED FILL — DEXTROSE 50 % IV SOLN: 50 % | INTRAVENOUS | Qty: 50

## 2015-07-06 MED FILL — DEXTROSE-NACL 5-0.9 % IV SOLN: INTRAVENOUS | Qty: 1000

## 2015-07-06 MED FILL — HYDROMORPHONE HCL 1 MG/ML IJ SOLN: 1 MG/ML | INTRAMUSCULAR | Qty: 1

## 2015-07-06 MED FILL — ZOSYN 3.375 (3-0.375) G IV SOLR: 3.375 (3-0.375) g | INTRAVENOUS | Qty: 3.38

## 2015-07-06 MED FILL — AMINOSYN II 15 % IV SOLN: 15 % | INTRAVENOUS | Qty: 473.3

## 2015-07-06 MED FILL — MUPIROCIN 2 % EX OINT: 2 % | CUTANEOUS | Qty: 22

## 2015-07-06 MED FILL — LACTATED RINGERS IV SOLN: INTRAVENOUS | Qty: 1000

## 2015-07-06 MED FILL — CALCIUM GLUCONATE 10 % IV SOLN: 10 % | INTRAVENOUS | Qty: 10

## 2015-07-06 MED FILL — MAGNESIUM SULFATE IN D5W 10-5 MG/ML-% IV SOLN: 10-5 MG/ML-% | INTRAVENOUS | Qty: 100

## 2015-07-06 MED FILL — VANCOMYCIN HCL 1000 MG IV SOLR: 1000 MG | INTRAVENOUS | Qty: 1000

## 2015-07-06 NOTE — Care Coordination-Inpatient (Signed)
SW referral received to transfer pt back to [facility] vs [facility].SW left a VM with Education officer, museum at [facility] and awaiting call back.

## 2015-07-06 NOTE — Plan of Care (Signed)
Problem: Falls - Risk of  Goal: Absence of falls  Outcome: Ongoing  Fall precautions in place; low bed, bed alarm, fall risk bracelet. Patient educated on safety with reported understanding.    Problem: Risk for Impaired Skin Integrity  Goal: Tissue integrity - skin and mucous membranes  Structural intactness and normal physiological function of skin and  mucous membranes.   Outcome: Ongoing  Patient with multiple skin issues. Encouraged to turn every two hours but refuses often. Heels elevated off bed. Pillow support provided. mepilex to bilat elbows and sacral heart.

## 2015-07-06 NOTE — Consults (Signed)
Pharmacy consulted to contact [facility] for TPN contents. The last TPN the patient received contained the following:    Clinimix E 5/20 5 % Solution  - Amino acids 71g  - Dextrose 284g  - Sodium 35 mEq/L  - Potassium 30 mEq/L  - Calcium 4.5 mEq/L  - Magnesium 13.12 mEq/L  - Phosphate 15 mmol/L  - Chloride 39 mEq/L  - Acetate 80 mEq/L  - MVI + Vit K 10 mL  - Trace elements 1 mL    Total volume: 1249.6 mL  Rate: 60 mL/hr    Please call with questions.    Zoe Lan, PharmD  PGY-1 Pharmacy Resident  Wireless: [phone]  07/06/2015 7:12 AM

## 2015-07-06 NOTE — Progress Notes (Signed)
Patient reports sufficient control of pain during LDA assessment/management. Patient was provided with new warm gown and new warm bed linens. Repositioned in bed with pillow support to guard bony prominences. Patient in agreement to continue premedicating prior to [facility] management.    Ivor Messier, RN

## 2015-07-06 NOTE — Consults (Signed)
Initial Pulmonary & Critical Care Consult Note      Reason for Consult: Pneumonia  Requesting Physician: Dr. Junius Argyle  Subjective:   CHIEF COMPLAINT / HPI:                The patient is a 45 y.o. 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 Her operative course was complicated by a rectovaginal fistula with recurrent pre-sacral abscesses and urological complications which required bilateral nephrostomy tubes. She was discharged to day to [facility]. However, when [facility] staff was changing her midline dressings they noted a large amount of blood and became hypotensive. She was brought via ambulance to [facility]. Lab work demonstrated leukocytosis and CT scan showed multiple abdominal abscesses with large pelvic abscess . Her Hgb was 6.8 in ED and she was transfused 1 unit of blood.     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 She denies dyspnea, cough or CP    Past Medical History:      Diagnosis Date   ??? Colorectal cancer [facility])       Past Surgical History:        Procedure Laterality Date   ??? Colonoscopy     ??? Abdomen surgery     ??? Hysterectomy       Current Medications:    ??? dextrose       ??? vancomycin  750 mg Intravenous Q12H   ??? calcium gluconate IVPB  1 g Intravenous Once   ??? magnesium sulfate  1 g Intravenous Once   ??? potassium chloride  20 mEq Intravenous Q1H   ??? sodium chloride flush  10 mL Intravenous 2 times per day   ??? enoxaparin  40 mg Subcutaneous Daily   ??? piperacillin-tazobactam  3.375 g Intravenous Q8H        Social History:    Never smoked    Family History:       REVIEW OF SYSTEMS:    CONSTITUTIONAL:  negative for fevers, chills, diaphoresis, activity change, appetite change, fatigue, night sweats and unexpected weight change.   EYES:  negative for blurred vision, eye discharge, visual disturbance and icterus  HEENT:  negative  for hearing loss, tinnitus, ear drainage, sinus pressure, nasal congestion, epistaxis and snoring  RESPIRATORY:  See HPI  CARDIOVASCULAR:  negative for chest pain, palpitations, exertional chest pressure/discomfort, edema, syncope  GASTROINTESTINAL:  negative for nausea, vomiting, diarrhea, constipation, blood in stool and abdominal pain  GENITOURINARY:  negative for frequency, dysuria, urinary incontinence, decreased urine volume, and hematuria  HEMATOLOGIC/LYMPHATIC:  negative for easy bruising, bleeding and lymphadenopathy  ALLERGIC/IMMUNOLOGIC:  negative for recurrent infections, angioedema, anaphylaxis and drug reactions  ENDOCRINE:  negative for weight changes and diabetic symptoms including polyuria, polydipsia and polyphagia  MUSCULOSKELETAL:  negative for  pain, joint swelling, decreased range of motion and muscle weakness  NEUROLOGICAL:  negative for headaches, slurred speech, unilateral weakness  PSYCHIATRIC/BEHAVIORAL: negative for hallucinations, behavioral problems, confusion and agitation.     Objective:   PHYSICAL EXAM:      VITALS:    Visit Vitals   ??? BP 109/81   ??? Pulse 96   ??? Temp 97.9 ??F (36.6 ??C) (Oral)   ??? Resp 13   ??? Ht 5\' 2"  (1.575 m)   ??? Wt 101 lb 3.1 oz (45.9 kg)   ??? SpO2 100%   ???  BMI 18.51 kg/m2     24HR INTAKE/OUTPUT:    Intake/Output Summary (Last 24 hours) at 07/06/15 [id number]  Last data filed at 07/06/15 0555   Gross per 24 hour   Intake             1761 ml   Output             1300 ml   Net              461 ml     CURRENT PULSE OXIMETRY:  SpO2: 100 % on RA  24HR PULSE OXIMETRY RANGE:  SpO2  Avg: 99.8 %  Min: 98 %  Max: 100 %    CONSTITUTIONAL:  awake, alert, cooperative, no apparent distress, and appears stated age  NECK:  Supple, symmetrical, trachea midline, no adenopathy, thyroid symmetric, not enlarged and no tenderness, skin normal  LUNGS:  no increased work of breathing and clear to auscultation. No accessory muscle use  CARDIOVASCULAR:  normal S1 and S2, no edema and no  JVD  ABDOMEN:  normal bowel sounds, non-distended and no masses palpated, and no tenderness to palpation. No hepatospleenomegaly  LYMPHADENOPATHY:  no axillary or supraclavicular adenopathy. No cervical adnenopathy  PSYCHIATRIC: Oriented to person place and time. No obvious depression or anxiety.  MUSCULOSKELETAL: No obvious misalignment or effusion of the joints. No clubbing, cyanosis of the digits.  SKIN:  normal skin color, texture, turgor and no redness, warmth, or swelling. No palpable nodules    DATA:    Old records have been reviewed  CBC with Differential:  Lab Results   Component Value Date    WBC 10.4 07/06/2015    RBC 2.55 07/06/2015    HGB 7.5 07/06/2015    HCT 22.2 07/06/2015    PLT 54 07/06/2015    MCV 87.1 07/06/2015    MCH 29.5 07/06/2015    MCHC 33.9 07/06/2015    RDW 15.5 07/06/2015    NRBC 1 06/27/2015    BANDSPCT 34 07/04/2015    BLASTSPCT 1 07/03/2015    METASPCT 5 07/02/2015    LYMPHOPCT 25.4 07/06/2015    MONOPCT 8.3 07/06/2015    MYELOPCT 1 06/26/2015    BASOPCT 0.1 07/06/2015    MONOSABS 0.9 07/06/2015    LYMPHSABS 2.6 07/06/2015    EOSABS 0.0 07/06/2015    BASOSABS 0.0 07/06/2015     BMP:  Lab Results   Component Value Date    NA 137 07/06/2015    K 3.3 07/06/2015    CL 111 07/06/2015    CO2 16 07/06/2015    BUN 20 07/06/2015    CREATININE 0.5 07/06/2015    CREATININE 0.5 07/05/2015    CALCIUM 6.8 07/06/2015    GFRAA >60 07/06/2015    LABGLOM >60 07/06/2015    GLUCOSE 207 07/06/2015     Hepatic Function Panel:  Lab Results   Component Value Date    ALKPHOS 77 07/05/2015    ALT 25 07/05/2015    AST 23 07/05/2015    PROT 3.4 07/05/2015    BILITOT 0.4 07/05/2015    BILIDIR <0.2 07/05/2015    IBILI see below 07/05/2015     LA 2.9 -> 2.1  INR 1.65  VBG:  7.34/23    UA: Lg blood, > 300 protein, Large LE, Neg nitrite    Cultures:   Blood Culture:  In lab     Radiology Review:  All pertinent images / reports were reviewed as a part of this visit.  CXR reveals the following:  AP PORTABLE CHEST. ??1835    ??   INDICATIONS: Shortness of breath   ??   Comparison studies: 06/26/2015   ??   Right internal jugular central line in satisfactory position in   the mid superior vena cava   Right subclavian portacatheter tip in the mid superior vena cava   in stable position   ??   Endotracheal tube has been removed   No subcutis or soft tissue abnormalities   ??   Trachea is midline. Heart and mediastinal contour are normal.   ??   Lungs are clear without active pulmonary opacities or effusion.   ??   ??   Impression   1. No acute cardiopulmonary abnormalities.   2. Lines in stable position     CT Abdomen  Impression   1. Multiple abscess, large pelvic abscess with surgical drain,   right issue rectal fossa.   2. 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   3. Granulation tissue enhancement or blood extravasation in the   lower abdominal wall as described   4. Proximity of the small bowel vasculature at the level of the   open wound on the right, side of active bleeding   5. Lower lobe bronchopneumonia and aspiration is also considered     Assessment/Plan       1. Abnormal CT Lung - There is RLL ASD/GGO on CT of abdomen. It is mild and may be inflammatory or atelectasis. Clinically she has no symptoms of pneumonia and does not ABx specifically for that. Cont IS     Will follow peripherally    Sherril Cong

## 2015-07-06 NOTE — Progress Notes (Signed)
Patient requiring extensive encouragement to allow staff to provide care, discussed patient's goals related to pain control and necessary management of patient's multiple drains/skin issues. Patient in a agreement to be medicated with next available PRN pain medication, allow sufficient onset, and then allow staff to empty drains, assess skin integrity issues, and reposition.    Ivor Messier, RN

## 2015-07-06 NOTE — Plan of Care (Signed)
Problem: Nutrition  Goal: Optimal nutrition therapy  Outcome: Ongoing  Nutrition Problem: Increased nutrient needs  Intervention: Food and/or Nutrient Delivery: Continue NPO  Nutritional Goals: PN to provide 100% estimated needs

## 2015-07-06 NOTE — Consults (Signed)
Nutrition Assessment    Type and Reason for Visit: Initial, Positive Nutrition Screen, Consult    Nutrition Recommendations:   Parenteral Nutrition RECOMMENDATIONS:    1. TG, Mg, Phos, CMP now if not done in last 24 hours.   2. Day 1: Standard PN with lipids at 1000 mL for the 1st 24 hrs (50g Amino Acids, 150g dextrose, 40g lipids = 1110 Cal).   3. Monitor closely and correct lytes (Phos,Mg,K+) d/t risk of refeeding syndrome.  4. Day 2: Customized PN: 70 gm Amino Acids, 170 gm Dextrose, 40 gm lipids in 1125 mL total volume.  5. Day 3/Goal: Customized PN: 90 gm Amino Acids, 190 gm Dextrose, 40 gm Lipids in 1300 mL total volume to provide 1406 kcals, 90 gm Protein.  6. Pharmacy to adjust electrolytes, MVI and Trace Elements as appropriate.   7. Recommend clinical team monitor weights and labs 3x/week.  8. Monitor POCG and need for insulin    9. Weekly TG recommended.   10. When PN to be discontinued, cut rate by 50% and let current bag run out.       Lab goals while on PN:  Phosphorous >2.0; currently (2.6)  Magnesium >1.5; currently (1.80)  Potassium >3.3; currently (3.3)  TG <500; currently not measured.    Malnutrition Assessment:  ?? Malnutrition Status: At risk for malnutrition  ?? Context: Acute illness or injury  ?? Findings of the 6 clinical characteristics of malnutrition (Minimum of 2 out of 6 clinical characteristics is required to make the diagnosis of moderate or severe Protein Calorie Malnutrition based on AND/ASPEN Guidelines):  1. Energy Intake-Not available,      2. Weight Loss-5% loss or greater (may be d/t fluid balance), in 1 month  3. Fat Loss-Unable to assess (moderate loss on evaluation 3/20), Triceps  4. Muscle Loss-Unable to assess (moderate muscle mass loss on evaluation 3/20), Clavicles (pectoralis and deltoids), Thigh (quadriceps)  5. Fluid Accumulation-Mild fluid accumulation, Extremities  6. Grip Strength-Not measured    Nutrition Diagnosis:   ?? Problem: Increased nutrient  needs  ?? Etiology: related to Acute injury/trauma, Alteration in GI function, Nutrition support - PN    ??? Signs and symptoms:  as evidenced by Nutrition support - PN, NPO status due to medical condition, Presence of wounds    Nutrition Assessment:  ?? Subjective Assessment: Consult and + nutrition screen for nutrition evaluation, poor po, weight loss and wounds.  Pt with colorectal cancer with enterocutaneous fistula, surgical incision 3/14, pressure ulcers to sacrum and necrotic toes.  Pt has drains to L LLQ, ant abdomen and bilateral nephrostomies.  Pt seen in ICU.  Was whining and crying the she didn't want so many covers on.  Assisted by removing top cover and RN came in to help patient.  Pt had covers pulled up to underneath her chin.  Pt in too much distress to be able to answer any questions.  Has been on PN during previous admission to another [facility] facility.  PN planned, but not started at time of visit.  No colostomy output recorded.  K+ 3.3, BG 207, Phos 2.6, Mg 1.80.  No TG measured.  650 mL output x 24 hours from drains.  ?? Nutrition-Focused Physical Findings: 45 yo AA female in distress.  No colostomy output measured.  RLE +2 edema, LLE +1 edema  ?? Wound Type: Surgical Wound, Pressure Ulcer, Multiple (necrotic toes)  ?? Current Nutrition Therapies:  ?? Oral Diet Orders: NPO   ?? Oral Diet intake:  NPO  ?? Oral Nutrition Supplement (ONS) Orders: None  ?? ONS intake: NPO  ?? Parenteral Nutrition Orders:  ?? Type and Formula: None   ?? Current PN Order Provides: not started yet  ?? Goal PN Orders Provides: RD Goal:  90 g amino acids, 190 g dextrose, 40 g lipids in 1300 mL TV to provide 1406 kcal, 90 g protein  ?? Additional Calories: PN  ?? Anthropometric Measures:  ?? Ht: 5\' 2"  (157.5 cm)   ?? Current Body Wt: 101 lb (45.8 kg)  ?? Admission Body Wt: 101 lb (45.8 kg)  ?? Usual Body Wt: 109 lb (49.4 kg) (3/14)  ?? % Weight Change: -7% (may be partially d/t fluid balance),  2 weeks  ?? Ideal Body Wt: 110 lb (49.9 kg),   ?? BMI  Classification: BMI <18.5 Underweight   ??   ?? Comparative Standards (Estimated Nutrition Needs):  ?? Estimated Daily Total Kcal: [id number]  ?? Estimated Daily Protein (g): 75-100 g  ?? Estimated Daily Total Fluid (ml/day): [id number]    Estimated Intake vs Estimated Needs: Intake Less Than Needs    Nutrition Risk Level: High    Nutrition Interventions:   Continue NPO  Continued Inpatient Monitoring    Nutrition Evaluation:   ?? Evaluation: Goals set   ?? Goals: PN to provide 100% estimated needs   ?? Monitoring: PN Intake, PN Tolerance, Wound Healing, Mental Status/Confusion, Ascites/Edema, Weight, Comparative Standards, Pertinent Labs    See Adult Nutrition Doc Flowsheet for more detail.     Electronically signed by Jerilynn Mages. Eric Form, RD, LD on 07/06/15 at 1:22 PM    Contact Number: [phone]

## 2015-07-06 NOTE — Plan of Care (Signed)
Problem: Falls - Risk of  Goal: Absence of falls  Outcome: Met This Shift  Patient does not attempt to get OOB.    Problem: Risk for Impaired Skin Integrity  Goal: Tissue integrity - skin and mucous membranes  Structural intactness and normal physiological function of skin and  mucous membranes.   Outcome: Met This Shift  Skin care protocol in place. No changes noted in multiple wound/skin sites.    Problem: Nutrition  Goal: Optimal nutrition therapy  Outcome: Ongoing  TPN initiated today.    Problem: Pain Control  Goal: Maintain pain level at or below patient???s acceptable level (or 5 if patient is unable to determine acceptable level)  Outcome: Ongoing  See pain flow. Patient voiced better pain relief with increased Dilaudid dose.    Problem: Cardiovascular  Goal: Hemodynamic stability  Outcome: Met This Shift  VSS.

## 2015-07-06 NOTE — Consults (Signed)
Clinical Pharmacy Progress Note    ADMIT DATE: 07/05/2015     45 year old female transferred to [facility] ED from [facility] after becoming hypotensive with significant bleeding from midline dressings. Patient has complex medical and surgical history including modified posterior pelvic exenteration and HIPEC in 2011.  Patient was discharged 3/24 to [facility] after placement of bilateral nephrostomy tubes. Patient received 1 unit of PRBC in ED.    Pharmacy has been consulted to dose TPN by Dr. Graciella Belton with the request to mirror TPN patient was receiving at [facility].    PERTINENT MEDICATIONS:  Zosyn 3.375g IV q8h EI -- day # 1  Vancomycin 750 mg IV q12h -- day # 1    LABORATORY:        Recent Labs      07/05/15   2240  07/06/15   0450   NA  135*  137   K  4.1  3.3*   CL  108  111*   CO2  15*  16*   BUN  22*  20   CREATININE  0.5*  0.5*   GLUCOSE  79  207*     Calcium 6.8 (Corrected Ca = 8.12)  Phosphorus 2.6  Magnesium 1.8    Glucoses last 24 hours:  64 - 220 mg/dL    Recent Labs      07/05/15   2240  07/06/15   0450   WBC  19.6*  10.4   HGB  10.2*  7.5*   PLT  70*  54*       VITALS:  Visit Vitals   ??? BP 104/76   ??? Pulse 96   ??? Temp 98.4 ??F (36.9 ??C) (Oral)   ??? Resp 13   ??? Ht 5\' 2"  (1.575 m)   ??? Wt 101 lb 3.1 oz (45.9 kg)   ??? SpO2 100%   ??? BMI 18.51 kg/m2       Intake/Output Summary (Last 24 hours) at 07/06/15 0911  Last data filed at 07/06/15 0555   Gross per 24 hour   Intake             1761 ml   Output             1300 ml   Net              461 ml       PROPHYLAXIS:  Stress ulcer: N/A  DVT: enoxaparin 40 mg SQ daily    ASSESSMENT/PLAN:  1. TPN -- day #1 TPN  ?? AA 71g, Dextrose 284g, and no lipids  ?? MVI & trace elements will be added to TPN daily.  ?? Electrolytes will be adjusted as appropriate. Today's TPN to contain the following electrolytes:   ?? Na acetate 50 mEq  ?? NaCl   50 mEq  ?? K Phos 15 mmol  ?? KCl  30 mEq  ?? Calcium 4.8 mEq  ?? Mag sulfate 12 mEq  Labs will be monitored daily & TPN re-ordered on a daily basis.    Please  call with any questions.    Zoe Lan, PharmD  PGY-1 Pharmacy Resident  Wireless: [phone]  07/06/2015 9:20 AM

## 2015-07-06 NOTE — Progress Notes (Addendum)
DEPARTMENT OF SURGERY  GENERAL SURGERY ICU DAILY PROGRESS NOTE  PGY-1    SUBJECTIVE:   Interval history: Pt presented to the [facility] ED from [place]. Pt noted to be hypotensive and hemodynamically compromised, so EMS took pt to the nearest hospital. A bleeding artery was noted and hemostasis was achieved with surgicel and pressure. Pt transferred to ICU for close monitoring. Pain slightly improved this morning.    LINES/ACCESS:   Central Access: R IJ triple lumen 06/26/2015, R SC port                                MEDICATIONS:   Antibiotics: Vancomycin (3/24), Zosyn (3/24)    Continuous Infusions:   ??? dextrose 5 % and 0.9 % NaCl 125 mL/hr at 07/05/15 2338     Scheduled Meds:   ??? dextrose       ??? vancomycin  750 mg Intravenous Q12H   ??? sodium chloride flush  10 mL Intravenous 2 times per day   ??? enoxaparin  40 mg Subcutaneous Daily   ??? piperacillin-tazobactam  3.375 g Intravenous Q8H        PRN Meds: sodium chloride flush, ondansetron, HYDROmorphone **OR** HYDROmorphone    OBJECTIVE:   Vitals:   Visit Vitals   ??? BP 108/77   ??? Pulse 88   ??? Temp 97.9 ??F (36.6 ??C) (Oral)   ??? Resp 14   ??? Ht 5\' 2"  (1.575 m)   ??? Wt 101 lb 3.1 oz (45.9 kg)   ??? SpO2 100%   ??? BMI 18.51 kg/m2     I/O:   Intake/Output Summary (Last 24 hours) at 07/06/15 0625  Last data filed at 07/06/15 0555   Gross per 24 hour   Intake             1761 ml   Output             1300 ml   Net              461 ml     I/O last 3 completed shifts:  In: -   Out: 725 [Urine:325; Drains:400]    Diet: Diet NPO Effective Now      Physical Examination:   General appearance: alert, chronically ill-appearing, cachectic; moaning during examination  Neck: supple  Heart: normal rate, regular rhythm, normal; no murmurs, rubs, clicks or gallops  Lungs: clear to auscultation, no wheezes, crackles, or rhonchi, symmetric air entry  Abdomen: soft, mildly tender, mildly distended  Extremities: bilateral radial pulses symmetric  Neurological: no focal findings or movement  disorder noted    Labs:  CBC:   Recent Labs      07/05/15   1759  07/05/15   2240  07/06/15   0450   WBC  16.1*  19.6*  10.4   HGB  6.8*  10.2*  7.5*   HCT  21.5*  31.0*  22.2*   PLT  99*  70*  54*       Renal function panel:  Lab Results   Component Value Date    CREATININE 0.5 (L) 07/06/2015    BUN 20 07/06/2015    NA 137 07/06/2015    K 3.3 (L) 07/06/2015    CL 111 (H) 07/06/2015    CO2 16 (L) 07/06/2015   Magnesium: 1.80  Phosphorus: 2.6  Calcium: 6.8, corrected 9.4  Albumin: 0.7  Glucose: 207    LFT'S:  Recent Labs      07/05/15   1759   AST  23   ALT  25   BILITOT  0.4   ALKPHOS  77     Troponin:   Recent Labs      07/05/15   1809  07/05/15   2240  07/06/15   0450   TROPONINI  0.02  0.06*  0.05*     BNP:   Recent Labs      07/05/15   1810   BNP  498.0*     ABGs:   INR:   Recent Labs      07/05/15   1755  07/05/15   2240   INR  1.61*  1.65*     Urinalysis:  Recent Labs      07/05/15   1819   COLORU  Not Entered   PHUR  8.5*   WBCUA  6-10*   RBCUA  >100*   YEAST  Present*   BACTERIA  4+*   CLARITYU  Not Entered   SPECGRAV  1.025   LEUKOCYTESUR  LARGE*   UROBILINOGEN  0.2   BILIRUBINUR  Negative   BLOODU  LARGE*   GLUCOSEU  Negative      Microbiology:    2 Blood cultures: Pending   Urine culture: Pending    ASSESSMENT AND PLAN:   Erica Harding is a 45 y.o. African-American female who was admitted s/p control of bleeding vessel in midline wound. Hospital day 1 and ICU day 1. Pt condition is improving.    Neurologic: Abdominal pain  - Continue Dilaudid pain panel    Cardiovascular: Troponin elevated on admission, trending down, 0.05 from 0.06, likely due to demand ischemia from low-output state secondary to sepsis  - Repeat troponin to further assess trend     Pulmonary: 100% on room air  - Aggressive pulmonary toilet    Gastrointestinal: Multiple enterocutaneous fistulas  - Continue wound care with saline-soaked Kerlix within appliance  - Continue NPO  - Continue TPN    Genitourinary: Bilateral nephrostomy tubes; UOP  adequate, -/325/325  - Both nephrostomy tubes should be attached to Foley bags  - Draining adequately, continue    Fluids: D5 NS at 125 mL/h  - Continue until TPN restarted    Electrolytes: Hypophosphatemia  - Replace potassium to 4.0  - Replace magnesium to 2.00    Nutrition: Discharged from [facility] yesterday on TPN; albumin 0.7  - Restart TPN    Hematology/Infectious disease: Multiple intra-abdominal abscesses  - Continue vancomycin and Zosyn  - Prophylactic Lovenox    Endocrine: No history of endocrine disorders  - Monitor blood glucose    Prophylaxis: Lovenox, SCDs, PT/OT    Disposition: ICU    Code Status: Full Code  -----------------------------  Rudy Jew, MD, PGY-1  07/06/2015 6:25 AM  [id number]    I agree with the residents interval history and examination findings as well as the assessment and plan.    Isahi Godwin [employer]

## 2015-07-06 NOTE — Progress Notes (Signed)
Unable to awaken patient for IS therapy at this time.

## 2015-07-07 LAB — RENAL FUNCTION PANEL
Albumin: 0.9 g/dL — ABNORMAL LOW (ref 3.4–5.0)
Anion Gap: 10 (ref 3–16)
BUN: 14 mg/dL (ref 7–20)
CO2: 16 mmol/L — ABNORMAL LOW (ref 21–32)
Calcium: 6.7 mg/dL — ABNORMAL LOW (ref 8.3–10.6)
Chloride: 113 mmol/L — ABNORMAL HIGH (ref 99–110)
Creatinine: <0.5 mg/dL — ABNORMAL LOW (ref 0.6–1.1)
GFR African American: >60 (ref >60)
GFR Non-African American: >60 (ref >60)
Glucose: 186 mg/dL — ABNORMAL HIGH (ref 70–99)
Phosphorus: 1.7 mg/dL — ABNORMAL LOW (ref 2.5–4.9)
Potassium: 3.6 mmol/L (ref 3.5–5.1)
Sodium: 139 mmol/L (ref 136–145)

## 2015-07-07 LAB — CBC WITH AUTO DIFFERENTIAL
Basophils %: 0 %
Basophils Absolute: 0 10*3/uL (ref 0.0–0.2)
Eosinophils %: 0 %
Eosinophils Absolute: 0 10*3/uL (ref 0.0–0.6)
Hematocrit: 19.6 % — CL (ref 36.0–48.0)
Hemoglobin: 6.4 g/dL — CL (ref 12.0–16.0)
Lymphocytes %: 23.6 %
Lymphocytes Absolute: 1.8 10*3/uL (ref 1.0–5.1)
MCH: 29.2 pg (ref 26.0–34.0)
MCHC: 32.8 g/dL (ref 31.0–36.0)
MCV: 89.1 fL (ref 80.0–100.0)
MPV: 9.3 fL (ref 5.0–10.5)
Monocytes %: 7.8 %
Monocytes Absolute: 0.6 10*3/uL (ref 0.0–1.3)
Neutrophils %: 68.6 %
Neutrophils Absolute: 5.3 10*3/uL (ref 1.7–7.7)
Platelets: 101 10*3/uL — ABNORMAL LOW (ref 135–450)
RBC: 2.2 M/uL — ABNORMAL LOW (ref 4.00–5.20)
RDW: 16.1 % — ABNORMAL HIGH (ref 12.4–15.4)
WBC: 7.7 10*3/uL (ref 4.0–11.0)

## 2015-07-07 LAB — MAGNESIUM: Magnesium: 1.6 mg/dL — ABNORMAL LOW (ref 1.80–2.40)

## 2015-07-07 LAB — PREPARE RBC (CROSSMATCH)
Dispense Status Blood Bank: TRANSFUSED
Dispense Status Blood Bank: TRANSFUSED

## 2015-07-07 LAB — POCT GLUCOSE: POC Glucose: 175 mg/dl — ABNORMAL HIGH (ref 70–99)

## 2015-07-07 LAB — PROTIME-INR
INR: 1.21 — ABNORMAL HIGH (ref 0.85–1.15)
Protime: 13.7 s — ABNORMAL HIGH (ref 9.6–13.0)

## 2015-07-07 MED ORDER — SODIUM CHLORIDE 0.9 % IV BOLUS
0.9 % | Freq: Once | INTRAVENOUS | Status: AC
Start: 2015-07-07 — End: 2015-07-07
  Administered 2015-07-07: 11:00:00 250 mL via INTRAVENOUS

## 2015-07-07 MED ORDER — POTASSIUM PHOSPHATE 3 MMOLE/ML IV SOLN (MIXTURES ONLY)
155 MMOLE/5ML | Freq: Once | INTRAVENOUS | Status: AC
Start: 2015-07-07 — End: 2015-07-07
  Administered 2015-07-07: 11:00:00 30 mmol via INTRAVENOUS

## 2015-07-07 MED ORDER — SODIUM CHLORIDE 0.9 % IV SOLN
0.9 % | INTRAVENOUS | Status: DC
Start: 2015-07-07 — End: 2015-07-07

## 2015-07-07 MED ORDER — INFUVITE IV INJ
70 % | INTRAVENOUS | Status: DC
Start: 2015-07-07 — End: 2015-07-07

## 2015-07-07 MED ORDER — LORAZEPAM 2 MG/ML IJ SOLN
2 MG/ML | Freq: Four times a day (QID) | INTRAMUSCULAR | Status: DC | PRN
Start: 2015-07-07 — End: 2015-07-08
  Administered 2015-07-07 – 2015-07-08 (×2): 0.5 mg via INTRAVENOUS

## 2015-07-07 MED ORDER — CALCIUM GLUCONATE 10 % IV SOLN
10 % | Freq: Once | INTRAVENOUS | Status: AC
Start: 2015-07-07 — End: 2015-07-07
  Administered 2015-07-07: 11:00:00 1 g via INTRAVENOUS

## 2015-07-07 MED ORDER — SODIUM CHLORIDE 0.9 % IV BOLUS
0.9 % | Freq: Once | INTRAVENOUS | Status: AC
Start: 2015-07-07 — End: 2015-07-07
  Administered 2015-07-07: 15:00:00 250 mL via INTRAVENOUS

## 2015-07-07 MED ORDER — STERILE WATER FOR INJECTION IV SOLN
155 MMOLE/5ML | INTRAVENOUS | Status: AC
Start: 2015-07-07 — End: 2015-07-08
  Administered 2015-07-07: 19:00:00 via INTRAVENOUS

## 2015-07-07 MED ORDER — MAGNESIUM SULFATE 2000 MG/50 ML IVPB PREMIX
2 GM/50ML | Freq: Once | INTRAVENOUS | Status: AC
Start: 2015-07-07 — End: 2015-07-07
  Administered 2015-07-07: 12:00:00 2 g via INTRAVENOUS

## 2015-07-07 MED FILL — ZOSYN 3.375 (3-0.375) G IV SOLR: 3.375 (3-0.375) g | INTRAVENOUS | Qty: 3.38

## 2015-07-07 MED FILL — MAGNESIUM SULFATE 2 GM/50ML IV SOLN: 2 GM/50ML | INTRAVENOUS | Qty: 50

## 2015-07-07 MED FILL — HYDROMORPHONE HCL 1 MG/ML IJ SOLN: 1 MG/ML | INTRAMUSCULAR | Qty: 1

## 2015-07-07 MED FILL — AMINOSYN II 15 % IV SOLN: 15 % | INTRAVENOUS | Qty: 473.3

## 2015-07-07 MED FILL — SODIUM CHLORIDE 0.9 % IV SOLN: 0.9 % | INTRAVENOUS | Qty: 250

## 2015-07-07 MED FILL — LORAZEPAM 2 MG/ML IJ SOLN: 2 MG/ML | INTRAMUSCULAR | Qty: 1

## 2015-07-07 MED FILL — DEXTROSE-NACL 5-0.9 % IV SOLN: INTRAVENOUS | Qty: 1000

## 2015-07-07 MED FILL — POTASSIUM PHOSPHATES 45 MMOLE/15ML IV SOLN: 45 MMOLE/15ML | INTRAVENOUS | Qty: 10

## 2015-07-07 MED FILL — VANCOMYCIN HCL 1000 MG IV SOLR: 1000 MG | INTRAVENOUS | Qty: 750

## 2015-07-07 MED FILL — CALCIUM GLUCONATE 10 % IV SOLN: 10 % | INTRAVENOUS | Qty: 10

## 2015-07-07 MED FILL — SODIUM CHLORIDE 0.9 % IV SOLN: 0.9 % | INTRAVENOUS | Qty: 500

## 2015-07-07 MED FILL — LOVENOX 40 MG/0.4ML SC SOLN: 40 MG/0.4ML | SUBCUTANEOUS | Qty: 0.4

## 2015-07-07 NOTE — Progress Notes (Signed)
Surgery residents paged and at bedside. Leaking from LLQ drain and anterior pouch drain. Surgery residents replaced bag, sutured line in place, and reinforced dressing on anterior bag. Patient very anxious throughout procedure and given ativan for anxiety. Patient skin cleansed with bath wipes and CHG wipes and new pad and linens placed following surgical dressing changes. Electronically signed by Gwenyth Ober, RN on 07/07/2015 at 11:13 PM

## 2015-07-07 NOTE — Progress Notes (Addendum)
Shift assessment completed.  Pt c/o 10/10 pain "everywhere, mainly feet and belly".  Pt states she is extremely nervous and she cannot tell me why. Drains and wounds assessed at this time.  Repositioned pt for comfort and  warm blankets applied.  Pt given PRN Dilaudid for pain.  1 unit PRBC transfusing at this time.   Will continue to monitor.

## 2015-07-07 NOTE — Plan of Care (Signed)
Problem: Pain:  Goal: Control of acute pain  Control of acute pain   Outcome: Ongoing  PRN dilaudid being given as needed/per schedule. Pt verbalizes understanding of pain scale ratings from 0-10. Will cont to monitor.     Problem: Risk for Impaired Skin Integrity   Goal: Tissue integrity - skin and mucous membranes   Structural intactness and normal physiological function of skin and  mucous membranes.   Outcome: Ongoing   Pt at risk for skin breakdown. See Braden score. Pt remains on bedrest with multiple abdominal drains with slight leaking. Unable to reposition self in bed. Heels elevated off bed and in prevalon boots. Will continue to turn and reposition patient every two hours and as needed. Will continue to keep patient clean and dry, applying skin care cream as needed. Pillows used for positioning. Will continue to monitor and assess for skin breakdown.    Problem: Falls - Risk of   Goal: Absence of falls   Outcome: Ongoing   Pt is a fall risk. See Leamon Arnt Fall Score. Pt bed is in low position, side rails up. Bed alarm is on. Will continue with hourly rounds for po intake, pain needs, and repositioning as needed. Will continue to monitor for needs.    Problem: Nutrition  Goal: Optimal nutrition therapy  Nutrition Problem: Inadequate oral intake  Intervention: Food and/or Nutrient Delivery: TPN being given through CP.  Outcome: Ongoing  Patient remains NPO other than limited liquids  .

## 2015-07-07 NOTE — Progress Notes (Signed)
Clinical Pharmacy Progress Note    ADMIT DATE: 07/05/2015     45 year old female transferred to [facility] ED from [facility] after becoming hypotensive with significant bleeding from midline dressings. Patient has complex medical and surgical history including modified posterior pelvic exenteration and HIPEC in 2011.  Patient was discharged 3/24 to [facility] after placement of bilateral nephrostomy tubes. Patient received 1 unit of PRBC in ED.    Pharmacy has been consulted to dose TPN by Dr. Graciella Belton with the request to mirror TPN patient was receiving at [facility].    PERTINENT MEDICATIONS:  Zosyn 3.375g IV q8h EI -- day # 2  Vancomycin 750 mg IV q12h -- day # 2    LABORATORY:        Recent Labs      07/06/15   0450  07/07/15   0450   NA  137  139   K  3.3*  3.6   CL  111*  113*   CO2  16*  16*   BUN  20  14   CREATININE  0.5*  <0.5*   GLUCOSE  207*  186*     Calcium 6.7 (Corrected Ca = 7.94)  Phosphorus 1.7   Magnesium 1.6    Glucoses last 24 hours:  135 - 175 - 186 mg/dL    Recent Labs      07/06/15   0450  07/07/15   0450   WBC  10.4  7.7   HGB  7.5*  6.4*   PLT  54*  101*       VITALS:  Visit Vitals   ??? BP 112/73   ??? Pulse 100   ??? Temp 98.3 ??F (36.8 ??C)   ??? Resp 11   ??? Ht 5\' 2"  (1.575 m)   ??? Wt 101 lb 3.1 oz (45.9 kg)   ??? SpO2 100%   ??? BMI 18.51 kg/m2       Intake/Output Summary (Last 24 hours) at 07/07/15 0739  Last data filed at 07/07/15 0701   Gross per 24 hour   Intake             5081 ml   Output             2950 ml   Net             2131 ml       PROPHYLAXIS:  Stress ulcer: N/A  DVT: enoxaparin 40 mg SQ daily    ASSESSMENT/PLAN:  1. TPN -- day #2 TPN  ?? AA 71g, Dextrose 284g, and no lipids  ?? MVI & trace elements will be added to TPN daily.  ?? Electrolytes will be adjusted as appropriate. Today's TPN to contain the following electrolytes:   ?? Na acetate 75 mEq  ?? NaCl   25 mEq  ?? K Phos 20 mmol  ?? KCl  30 mEq  ?? Calcium 9.6 mEq  ?? Mag sulfate 16 mEq  Labs will be monitored daily & TPN re-ordered on a daily basis.    Please  call with any questions.    Zoe Lan, PharmD  PGY-1 Pharmacy Resident  Wireless: [phone]  07/07/2015 7:39 AM

## 2015-07-07 NOTE — Progress Notes (Addendum)
DEPARTMENT OF SURGERY  GENERAL SURGERY ICU DAILY PROGRESS NOTE  PGY-1    SUBJECTIVE:   Interval history:   No active bleeding overnight. Remains hemodynamically stable     LINES/ACCESS:   Central Access: R IJ triple lumen 06/26/2015, R SC port                                MEDICATIONS:   Antibiotics: Vancomycin (3/24), Zosyn (3/24)    Continuous Infusions:   ??? PN-Adult 2-in-1 Central Line (Custom) 52.1 mL/hr at 07/06/15 1918   ??? dextrose 5 % and 0.9 % NaCl 125 mL/hr at 07/07/15 0027     Scheduled Meds:   ??? sodium chloride  250 mL Intravenous Once   ??? calcium gluconate IVPB  1 g Intravenous Once   ??? magnesium sulfate  2 g Intravenous Once   ??? potassium phosphate IVPB  30 mmol Intravenous Once   ??? vancomycin  750 mg Intravenous Q12H   ??? mupirocin  1 g Nasal BID   ??? sodium chloride flush  10 mL Intravenous 2 times per day   ??? enoxaparin  40 mg Subcutaneous Daily   ??? piperacillin-tazobactam  3.375 g Intravenous Q8H        PRN Meds: HYDROmorphone **OR** HYDROmorphone, sodium chloride flush, ondansetron    OBJECTIVE:   Vitals:   Visit Vitals   ??? BP 112/73   ??? Pulse 100   ??? Temp 98.3 ??F (36.8 ??C)   ??? Resp 11   ??? Ht 5\' 2"  (1.575 m)   ??? Wt 101 lb 3.1 oz (45.9 kg)   ??? SpO2 100%   ??? BMI 18.51 kg/m2     I/O:     Intake/Output Summary (Last 24 hours) at 07/07/15 0732  Last data filed at 07/07/15 0701   Gross per 24 hour   Intake             5081 ml   Output             2950 ml   Net             2131 ml     I/O last 3 completed shifts:  In: [id number] [P.O.:150; I.V.:3581]  Out: 2950 [Urine:1180; Drains:1725; Stool:45]    Diet: PN-Adult 2-in-1 Central Line (Custom)  Diet NPO Effective Now Exceptions are: Popsicles, Other (See Comment)      Physical Examination:   General appearance: alert, chronically ill-appearing, cachectic; moaning during examination  Heart: normal rate, regular rhythm, normal; no murmurs, rubs, clicks or gallops  Lungs: clear to auscultation, no wheezes, crackles, or rhonchi, symmetric air entry  Abdomen: soft,  mildly tender, exposed bowel with wound protector in place, left side ostomy bag pouching open wound, ostomy pink and viable in the right lower quadrant  GU: nephrostomy tubes in place, and pelvic drain   Extremities: bilateral radial pulses symmetric    Labs:  CBC:   Recent Labs      07/05/15   2240  07/06/15   0450  07/07/15   0450   WBC  19.6*  10.4  7.7   HGB  10.2*  7.5*  6.4*   HCT  31.0*  22.2*  19.6*   PLT  70*  54*  101*       Renal function panel:  Lab Results   Component Value Date    CREATININE <0.5 (L) 07/07/2015    BUN 14 07/07/2015  NA 139 07/07/2015    K 3.6 07/07/2015    CL 113 (H) 07/07/2015    CO2 16 (L) 07/07/2015     LFT'S:   Recent Labs      07/05/15   1759   AST  23   ALT  25   BILITOT  0.4   ALKPHOS  77     Troponin:   Recent Labs      07/05/15   2240  07/06/15   0450  07/06/15   1210   TROPONINI  0.06*  0.05*  0.05*     BNP:   Recent Labs      07/05/15   1810   BNP  498.0*     ABGs:   INR:   Recent Labs      07/05/15   1755  07/05/15   2240  07/07/15   0450   INR  1.61*  1.65*  1.21*     Urinalysis:  Recent Labs      07/05/15   1819   COLORU  Not Entered   PHUR  8.5*   WBCUA  6-10*   RBCUA  >100*   YEAST  Present*   BACTERIA  4+*   CLARITYU  Not Entered   SPECGRAV  1.025   LEUKOCYTESUR  LARGE*   UROBILINOGEN  0.2   BILIRUBINUR  Negative   BLOODU  LARGE*   GLUCOSEU  Negative        ASSESSMENT AND PLAN:   Erica Harding is a 45 y.o. African-American female who was admitted s/p control of bleeding vessel in midline entero atmospheric fistula     Neurologic: Abdominal pain  - Continue Dilaudid pain panel    Cardiovascular: Troponin elevated on admission, trending down, 0.05 from 0.06, likely due to demand ischemia from low-output state secondary to sepsis  -Troponins remain at 0.05     Pulmonary: 100% on room air  - Aggressive pulmonary toilet    Gastrointestinal: Multiple enterocutaneous fistulas  - Continue wound care with saline-soaked Kerlix within appliance  - Continue NPO  - Continue  TPN    Genitourinary: Bilateral nephrostomy tubes; UOP adequate, -/325/325  - Both nephrostomy tubes should be attached to Foley bags  - Draining adequately, continue    Fluids:   - stop D5NS now that TPN restarted   - Continue until TPN restarted    Electrolytes:   -Hypophosphatemia, hypokalemia, and hypomagnesia- will replace and adjust electrolytes in TPN     Nutrition:   - Discharged from [facility] yesterday on TPN; albumin 0.7  - Continue TPN     Hematology/Infectious disease: Multiple intra-abdominal abscesses  - Continue vancomycin and Zosyn  - Prophylactic Lovenox    Endocrine: No history of endocrine disorders  - Monitor blood glucose    Prophylaxis: Lovenox, SCDs, PT/OT    Disposition: ICU- stable for discharge to [facility] vs. Transfer to [place] if unable to transfer to [place] Status: Full Code  -----------------------------  Sherlene Shams, MD, PGY-1  07/07/2015 7:32 AM  [phone]    I agree with the residents interval history and examination findings as well as the assessment and plan.    Sritha Chauncey [employer]

## 2015-07-07 NOTE — Progress Notes (Signed)
Critical HGB 6.4, surgical resident [place] paged. Return call at [place], order for 1 unit PRBC received. Also updated on low Mg, Ca, Phos; orders to replace received. Patient updated on POC. Consent on chart.    Ivor Messier, RN

## 2015-07-07 NOTE — Progress Notes (Addendum)
Physical Therapy/Occupational Therapy    No treatment    Referral received and chart reviewed. Pt currently with hemoglobin of 6.4 and receiving blood. Will hold PT and OT at this time per protocol. Will f/u later pm as time allows or 07/08/15.    Olene Floss, PT  [place], OTR/L, [phone]

## 2015-07-07 NOTE — Plan of Care (Signed)
Problem: Falls - Risk of  Goal: Absence of falls  Outcome: Ongoing  Pt at risk for falls.  Fall protocol in place. Bed locked and in lowest position.  Bed alarm engaged.  Call light within reach.  Will continue to monitor for safety.     Problem: Risk for Impaired Skin Integrity  Goal: Tissue integrity - skin and mucous membranes  Structural intactness and normal physiological function of skin and  mucous membranes.   Outcome: Ongoing  Pt at risk for impaired skin integrity.  Pt Q2 hour repositioning but refusing at times d/t pain. Educated pt on importance of repositioning to prevent skin breakdown. Pt has multiple surgical wounds and drains.  Pt also has abrasions to bilateral elbows,  wound on R hip and Stage II on coccyx, right foot has 5 necrotic toes.  No new s/s of skin breakdown at this time.  Will continue to monitor and assess skin.      Problem: Nutrition  Goal: Optimal nutrition therapy  Outcome: Ongoing  Pt taking small sips of clears and ice chips.  1/2 cup of clears and 1/4 cup of ice chips Q4H.  TPN infusing at 52.1 ml/hr.  Will continue to monitor nutrition.      Problem: Pain Control  Goal: Maintain pain level at or below patient???s acceptable level (or 5 if patient is unable to determine acceptable level)  Outcome: Ongoing  Pt receiving Dilaudid Q3H for pain.  Pt complaining of constant pain in between medication doses.  Alternate therapy utilized such as warm blankets, quiet environment and repositioning.  Will continue to monitor and reassess pain.

## 2015-07-08 ENCOUNTER — Inpatient Hospital Stay: Payer: Medicare (Managed Care)

## 2015-07-08 ENCOUNTER — Inpatient Hospital Stay
Admission: RE | Admit: 2015-07-08 | Discharge: 2015-09-13 | Disposition: A | Discharge status: Other Acute Inpatient Hospital | Payer: Medicare (Managed Care)

## 2015-07-08 ENCOUNTER — Inpatient Hospital Stay: Admit: 2015-07-08 | Payer: Medicare (Managed Care)

## 2015-07-08 ENCOUNTER — Inpatient Hospital Stay: Primary: Internal Medicine

## 2015-07-08 ENCOUNTER — Inpatient Hospital Stay: Admit: 2015-07-08 | Primary: Internal Medicine

## 2015-07-08 DIAGNOSIS — IMO0002 Reserved for concepts with insufficient information to code with codable children: Secondary | ICD-10-CM

## 2015-07-08 LAB — POC GLU MONITORING DEVICE: POC Glucose Monitoring Device: 93 mg/dL (ref 70–100)

## 2015-07-08 LAB — RENAL FUNCTION PANEL
Albumin: 1.1 g/dL — ABNORMAL LOW (ref 3.4–5.0)
Anion Gap: 9 (ref 3–16)
BUN: 12 mg/dL (ref 7–20)
CO2: 20 mmol/L — ABNORMAL LOW (ref 21–32)
Calcium: 7.2 mg/dL — ABNORMAL LOW (ref 8.3–10.6)
Chloride: 106 mmol/L (ref 99–110)
Creatinine: <0.5 mg/dL — ABNORMAL LOW (ref 0.6–1.1)
GFR African American: >60 (ref >60)
GFR Non-African American: >60 (ref >60)
Glucose: 112 mg/dL — ABNORMAL HIGH (ref 70–99)
Phosphorus: 2.4 mg/dL — ABNORMAL LOW (ref 2.5–4.9)
Potassium: 3.7 mmol/L (ref 3.5–5.1)
Sodium: 135 mmol/L — ABNORMAL LOW (ref 136–145)

## 2015-07-08 LAB — CBC WITH AUTO DIFFERENTIAL
Basophils %: 0.1 %
Basophils Absolute: 0 10*3/uL (ref 0.0–0.2)
Eosinophils %: 0 %
Eosinophils Absolute: 0 10*3/uL (ref 0.0–0.6)
Hematocrit: 29.6 % — ABNORMAL LOW (ref 36.0–48.0)
Hemoglobin: 10.2 g/dL — ABNORMAL LOW (ref 12.0–16.0)
Lymphocytes %: 25.1 %
Lymphocytes Absolute: 2.6 10*3/uL (ref 1.0–5.1)
MCH: 29.1 pg (ref 26.0–34.0)
MCHC: 34.6 g/dL (ref 31.0–36.0)
MCV: 84.1 fL (ref 80.0–100.0)
MPV: 8.4 fL (ref 5.0–10.5)
Monocytes %: 8 %
Monocytes Absolute: 0.8 10*3/uL (ref 0.0–1.3)
Neutrophils %: 66.8 %
Neutrophils Absolute: 6.9 10*3/uL (ref 1.7–7.7)
Platelets: 130 10*3/uL — ABNORMAL LOW (ref 135–450)
RBC: 3.52 M/uL — ABNORMAL LOW (ref 4.00–5.20)
RDW: 16.8 % — ABNORMAL HIGH (ref 12.4–15.4)
WBC: 10.4 10*3/uL (ref 4.0–11.0)

## 2015-07-08 LAB — MAGNESIUM: Magnesium: 1.8 mg/dL (ref 1.80–2.40)

## 2015-07-08 LAB — POCT HEMOGLOBIN: Hemoglobin: 6.5 g/dL — CL (ref 12.0–16.0)

## 2015-07-08 MED ORDER — FENTANYL 25 MCG/HR TD PT72
25 MCG/HR | TRANSDERMAL | Status: DC
Start: 2015-07-08 — End: 2015-07-08

## 2015-07-08 MED ORDER — ACETAMINOPHEN 10 MG/ML IV SOLN
10 MG/ML | Freq: Three times a day (TID) | INTRAVENOUS | Status: DC
Start: 2015-07-08 — End: 2015-07-08
  Administered 2015-07-08: 15:00:00 1000 mg via INTRAVENOUS

## 2015-07-08 MED ORDER — SODIUM CHLORIDE 0.9 % IV SOLN
0.9 % | INTRAVENOUS | Status: AC
Start: 2015-07-08 — End: 2015-07-08
  Administered 2015-07-08: 07:00:00 250

## 2015-07-08 MED ORDER — VANCOMYCIN HCL IN DEXTROSE 500-5 MG/100ML-% IV SOLN
500-5 MG/100ML-% | INTRAVENOUS | 0 refills | Status: AC
Start: 2015-07-08 — End: ?

## 2015-07-08 MED ORDER — INFUVITE IV INJ
70 % | INTRAVENOUS | Status: DC
Start: 2015-07-08 — End: 2015-07-08

## 2015-07-08 MED ORDER — HYDROMORPHONE HCL 1 MG/ML IJ SOLN
1 MG/ML | INTRAMUSCULAR | Status: AC | PRN
Start: 2015-07-08 — End: 2015-07-08
  Administered 2015-07-08 (×2): 1 mg via INTRAVENOUS

## 2015-07-08 MED ORDER — DEXTROSE 5 % IV SOLN
5 % | Freq: Once | INTRAVENOUS | Status: AC
Start: 2015-07-08 — End: 2015-07-08
  Administered 2015-07-08: 17:00:00 30 mmol via INTRAVENOUS

## 2015-07-08 MED ORDER — fluconazole (DIFLUCAN) 200 mg IV in NaCl (iso-osm) 100 mL
200 | INTRAVENOUS | Status: AC
Start: 2015-07-08 — End: 2015-08-12
  Administered 2015-07-08 – 2015-08-11 (×35): 200 mg via INTRAVENOUS

## 2015-07-08 MED ORDER — fat emulsion (LIPOSYN III) 20 % infusion 250 mL
20 | INTRAVENOUS | Status: AC
Start: 2015-07-08 — End: 2015-07-08

## 2015-07-08 MED ORDER — insulin regular (HumuLIN R) injection Soln 0-5 Units
100 | Freq: Four times a day (QID) | INTRAMUSCULAR | Status: AC
Start: 2015-07-08 — End: 2015-07-25
  Administered 2015-07-10 (×2): 1 [IU] via SUBCUTANEOUS
  Administered 2015-07-11: 13:00:00 3 [IU] via SUBCUTANEOUS
  Administered 2015-07-12: 12:00:00 0 [IU] via SUBCUTANEOUS
  Administered 2015-07-15: 22:00:00 1 [IU] via SUBCUTANEOUS

## 2015-07-08 MED ORDER — dextrose (INSTA-GLUCOSE) 40 % gel 15 g
40 | ORAL | Status: AC | PRN
Start: 2015-07-08 — End: 2015-09-13

## 2015-07-08 MED ORDER — fentaNYL (DURAGESIC) 25 mcg/hr 1 patch
25 | TRANSDERMAL | Status: AC
Start: 2015-07-08 — End: 2015-07-09
  Administered 2015-07-08: 1 via TRANSDERMAL

## 2015-07-08 MED ORDER — ipratropium-albuterol (DUO-NEB) 0.5 mg-3 mg(2.5 mg base)/3 mL nebulizer solution 3 mL
0.5 | RESPIRATORY_TRACT | Status: AC | PRN
Start: 2015-07-08 — End: 2015-09-13

## 2015-07-08 MED ORDER — LORazepam (ATIVAN) 2 mg/mL injection
2 | INTRAMUSCULAR | Status: AC | PRN
Start: 2015-07-08 — End: 2015-09-13
  Administered 2015-07-11 – 2015-09-02 (×3): 0.5 mg via INTRAVENOUS

## 2015-07-08 MED ORDER — clopidogrel (PLAVIX) tablet 75 mg
75 | Freq: Every day | ORAL | Status: AC
Start: 2015-07-08 — End: 2015-08-23
  Administered 2015-07-11 – 2015-08-20 (×25): 75 mg

## 2015-07-08 MED ORDER — glucose chewable tablet 12 g
4 | ORAL | Status: AC | PRN
Start: 2015-07-08 — End: 2015-07-08

## 2015-07-08 MED ORDER — piperacillin-tazobactam (ZOSYN) 3.375 g in sodium chloride 0.9% 100 mL ADV IVPB
Freq: Three times a day (TID) | INTRAVENOUS | Status: AC
Start: 2015-07-08 — End: 2015-08-12
  Administered 2015-07-09 – 2015-08-12 (×105): 3.375 g via INTRAVENOUS

## 2015-07-08 MED ORDER — aspirin chewable tablet 81 mg
81 | Freq: Every day | ORAL | Status: AC
Start: 2015-07-08 — End: 2015-07-21
  Administered 2015-07-11 – 2015-07-20 (×3): 81 mg

## 2015-07-08 MED ORDER — ondansetron (ZOFRAN) 4 mg/2 mL injection 4 mg
4 | Freq: Three times a day (TID) | INTRAMUSCULAR | Status: AC | PRN
Start: 2015-07-08 — End: 2015-08-14
  Administered 2015-07-09 – 2015-08-14 (×16): 4 mg via INTRAVENOUS

## 2015-07-08 MED ORDER — HYDROmorphone (DILAUDID) injection Syrg 1 mg
1 | INTRAMUSCULAR | Status: AC | PRN
Start: 2015-07-08 — End: 2015-07-09
  Administered 2015-07-08 – 2015-07-09 (×10): 1 mg via INTRAVENOUS

## 2015-07-08 MED ORDER — dextrose 50 % in water (D50W) iv Syrg 25-50 mL
INTRAVENOUS | Status: AC | PRN
Start: 2015-07-08 — End: 2015-09-13

## 2015-07-08 MED ORDER — vancomycin intermittent/pulse dosing
Status: AC | PRN
Start: 2015-07-08 — End: 2015-07-09

## 2015-07-08 MED FILL — PIPERACILLIN-TAZOBACTAM 3.375 GRAM INTRAVENOUS SOLUTION: 3.375 3.375 gram | INTRAVENOUS | Qty: 1

## 2015-07-08 MED FILL — FENTANYL 25 MCG/HR TRANSDERMAL PATCH: 25 25 mcg/hr | TRANSDERMAL | Qty: 1

## 2015-07-08 MED FILL — HYDROMORPHONE 1 MG/ML INJECTION SYRINGE: 1 1 mg/mL | INTRAMUSCULAR | Qty: 1

## 2015-07-08 MED FILL — VANCOMYCIN INTERMITTENT/PULSE DOSING: Qty: 1

## 2015-07-08 MED FILL — FLUCONAZOLE 200 MG/100 ML IN SOD. CHLORIDE (ISO) INTRAVENOUS PIGGYBACK: 200 200 mg/100 mL | INTRAVENOUS | Qty: 100

## 2015-07-08 MED FILL — ONDANSETRON HCL (PF) 4 MG/2 ML INJECTION SOLUTION: 4 4 mg/2 mL | INTRAMUSCULAR | Qty: 2

## 2015-07-08 MED FILL — HUMULIN R REGULAR U-100 INSULIN 100 UNIT/ML INJECTION SOLUTION: 100 100 unit/mL | INTRAMUSCULAR | Qty: 3

## 2015-07-08 MED FILL — ZOSYN 3.375 (3-0.375) G IV SOLR: 3.375 (3-0.375) g | INTRAVENOUS | Qty: 3.38

## 2015-07-08 MED FILL — HYDROMORPHONE HCL 1 MG/ML IJ SOLN: 1 MG/ML | INTRAMUSCULAR | Qty: 1

## 2015-07-08 MED FILL — OFIRMEV 10 MG/ML IV SOLN: 10 MG/ML | INTRAVENOUS | Qty: 100

## 2015-07-08 MED FILL — FENTANYL 25 MCG/HR TD PT72: 25 MCG/HR | TRANSDERMAL | Qty: 1

## 2015-07-08 MED FILL — LORAZEPAM 2 MG/ML IJ SOLN: 2 MG/ML | INTRAMUSCULAR | Qty: 1

## 2015-07-08 MED FILL — VANCOMYCIN HCL 1000 MG IV SOLR: 1000 MG | INTRAVENOUS | Qty: 750

## 2015-07-08 MED FILL — AMINOSYN II 15 % IV SOLN: 15 % | INTRAVENOUS | Qty: 473.3

## 2015-07-08 MED FILL — POTASSIUM PHOSPHATES 45 MMOLE/15ML IV SOLN: 45 MMOLE/15ML | INTRAVENOUS | Qty: 10

## 2015-07-08 MED FILL — LOVENOX 40 MG/0.4ML SC SOLN: 40 MG/0.4ML | SUBCUTANEOUS | Qty: 0.4

## 2015-07-08 MED FILL — SODIUM CHLORIDE 0.9 % IV SOLN: 0.9 % | INTRAVENOUS | Qty: 250

## 2015-07-08 NOTE — Unmapped (Signed)
Nursing Admission Notes    Linda Ponce is a 45 y.o. female arrived via stretcher on 07/08/2015  4:36 PM from [facility] to room 319-2 are present. Vital signs see below, patient respiratory status WNL.  Will medicate when orders are placed. Patient rates pain level at 8 will assess for proper pain coverage.    Additional Notes: Admitted to room 319-2 alert and oriented and able to voice needs and concerns, call light within reach, patient oriented to room call light and TV and staff, sister at bedside, see assessment for details.    Code Status - Full Code    Lines/Drains/Access - Yes    Oriented to room, call light in reach? - Yes    Filed Vitals:    07/08/15 1700 07/08/15 1725   BP: 114/99    Pulse: 93    Temp: 98 ??F (36.7 ??C)    TempSrc: Oral    Resp: 16    Height:  5' 2.5 (1.588 m)   Weight:  100 lb 4.8 oz (45.496 kg)   SpO2: 99%        Malva Diesing B Daysen Gundrum, RN

## 2015-07-08 NOTE — Unmapped (Signed)
Patient admitted into room 319-2, MD paged twice to verify orders and to clarify if patient is receiving TPN sent with patient and due to patient noted on pain with no orders to administer pain meds, no return call from MD, no visit from MD, charge nurse notified of no return call and awaiting return call, awaiting MD visit to address issues. call from pharmacy regarding vancomycin sent with patient and asked for a trough before dose given, oncoming nurse to obtain before vancomycin given, patient noted with several areas to abdomen , noted right IJ triple lumen without signs of infection noted, noted right colostomy site without signs of infection, noted WNL, noted ekins pouch to middle abdomen with greenish yellow drainage, noted right nephrostomy tube with site WNL, tube with orange drainage , noted ekins pouch to left side abdomen with small amount of brownish green drainage in pouch, noted left nephrostomy tube with orange urine and blood tinges in tubing, noted left femoral drain without drainage noted, noted right toes black, patient states excruciating pain if touched, RUA 20cm, LUA 21cm, RLA 14.5cm, LLA 14 cm, LUL41.5cm, LLL 27cm, RUL 35.5cm, RLL 25.5cm

## 2015-07-08 NOTE — Unmapped (Signed)
Clinical Pharmacy Service - Vancomycin Progress Note    Linda Ponce is a 45 y.o. female being treated with IV Vancomycin for feculent peritonitis (E faecium - amp R and E faecalis)    Assessment/Plan:   ?? Goal vancomycin trough is 10-15 mg/L. Per CareEverywhere, patient last received vancomycin today at 0208 and had been receiving vancomycin 750 mg q12h since Saturday at 1500. No new levels, previous trough was 10 mg/L on vancomycin 500 mg q24h.  ?? Check stat vancomycin level now since, if kinetics have not changed, a 750 mg q12h dose could result in a level of 40 mg/L 12 hours after the last dose or an 18 hour level of 30 mg/L. Kinetics might have changed since patient's anthropometrics and serum creatine normally suggestive of requiring higher doses (1g q12h) and SCr stable however patient's serum creatinine is a poor marker of renal function d/t cachexia and drug levels may be the only accurate method of determining clearance.    Subjective/Objective Information:    Anticipated Stop Date: TBD    Antibiotics Ordered     Current Anti-Infectives       Dose Frequency Start End    fluconazole (DIFLUCAN) 200 mg IV in NaCl (iso-osm) 100 mL 200 mg Every 24 hours 07/08/2015     Route: Intravenous         documented within (last 72 hours)     None        Labs           No relevant labs found         WBC, BUN, Creatinine     WBC   BUN   Creatinine      10.8 07/05/15 0659 23 07/05/15 0703 0.53 (!) 07/05/15 0703          Microbiology     Microbiology Results     No orders found from 07/02/2015 to 07/09/2015.          Pharmacy will continue to follow for the monitoring of therapy efficacy and toxicity, please call if you have any questions. Thank you for the consult.     Andree Elk, [facility]   07/08/2015 6:52 PM

## 2015-07-08 NOTE — Unmapped (Signed)
Problem: Fall Prevention  Goal: Patient will remain free of falls  Assess and monitor vitals signs, neurological status including level of consciousness and orientation. Reassess fall risk per hospital policy.    Ensure arm band on, uncluttered walking paths in room, adequate room lighting, call light and overbed table within reach, bed in low position, wheels locked, side rails up per policy, and non-skid footwear provided.   Outcome: Progressing  Fall precautions maintained, call light within reach, patient utilizes call light to ask for assistance as needed.  Arm band is secure, uncluttered walking pathways and adequate room lighting is maintained.  Call light and bed side table are within reach.  Bed is in low position, wheels locked, side rails per policy.  Non skid footwear is provided.

## 2015-07-08 NOTE — Unmapped (Addendum)
HOSPITALIST HISTORY AND PHYSICAL      Patient: Linda Ponce  MRN: [id number]  CSN: [id number]     Chief Complaint      Enterocutaneous fistulas     History of Present Illness      78f with history of colorectal CA, initially admitted to [facility] post operatively for continued wound care. Developed bleeding, sent back to [facility] where she was foundto have multiple pelvic abscesses, enterocutaneous fistulas, was kept NPO, started on IV abx, TPN, and returns for continued care.    Pt was transferred to [facility] for  physical therapy, occupational therapy and continuation of care for his chronic medical condition .  Previous notes and labs and imaging studies reviewed.    Loraine Leriche appropriate boxes with x).   I received a discharge summary prior to or upon the patient's arrival. Yes [  ] No x[  ]   I received a physician to physician report. Yes [  ] No [ x ]   Appropriate/thorough medication list was present prior to or upon the patient's arrival. Yes [x  ] No [  ]       Past Medical History      Past Medical History   Diagnosis Date   ??? Cancer    ??? Abscess of abdominal cavity    ??? Gangrene of foot    ??? AKI (acute kidney injury)    ??? Fistula    ??? Peritonitis and retroperitoneal infections    ??? Cachexia    ??? Hyperkalemia    ??? Dehydration    ??? Encephalopathies    ??? Ischemic foot        Past Surgical History      Past Surgical History   Procedure Laterality Date   ??? Colon surgery         Family History      History reviewed. No pertinent family history.    Social History      Social History     Social History   ??? Marital Status: Single     Spouse Name: N/A   ??? Number of Children: N/A   ??? Years of Education: N/A     Social History Main Topics   ??? Smoking status: Never Smoker    ??? Smokeless tobacco: None   ??? Alcohol Use: None   ??? Drug Use: None   ??? Sexual Activity: Not Currently     Other Topics Concern   ??? None     Social History Narrative       Medications      Allergies   Allergen Reactions   ???  Compazine [Prochlorperazine Edisylate]    ??? Latex, Natural Rubber        Scheduled Meds:  ??? [START ON 07/09/2015] aspirin  81 mg Per NG tube Daily with breakfast   ??? [START ON 07/09/2015] clopidogrel  75 mg Per NG tube Daily 0900   ??? fentaNYL  1 patch Transdermal Q72H   ??? fluconazole in NaCl (iso-osm)  200 mg Intravenous Q24H   ??? insulin regular  0-5 Units Subcutaneous Q6H     Continuous Infusions:   PRN Meds:.dextrose 50 % in water (D50W), glucose, HYDROmorphone, ipratropium-albuterol, LORazepam, ondansetron      Review of Systems      As above otherwise negative         Vital Signs      Temp:  [98 ??F (36.7 ??C)] 98 ??F (36.7 ??C)  Heart Rate:  [93] 93  Resp:  [16] 16  BP: (114)/(99) 114/99 mmHg  Physical Exam      GENERAL: The patient is awake, alert, oriented  not in acute cardiorespiratory distress.   HEENT:Atraumatic.Normocephalic. PERRLA, EOMI. No icterus, no pallor, no jugular venous distention.   NECK: Supple. Trachea is in midline. No Thyromegaly. No Lymphadenopathy.  CHEST: Symmetric. Clear to auscultation, bilateral equal air entry. No wheezing or rhonchi.  CARDIOVASCULAR: S1, S2, Regular pulse. No murmur or gallops. No JVD.  ABDOMEN: Soft, nontender, nondistended, positive bowel sounds.No viscera palpable.  EXTREMITIES: There is no edema in the lower extremities.  Pulses are positive in all extremities.Power is 5/5 in all extremities.(+) dry gangrene R foot         Laboratory Data        Lab name 07/05/15  0659   WBC 10.8   HEMOGLOBIN 10.1*   HEMATOCRIT 30.3*   MEAN CORPUSCULAR VOLUME 86.7   PLATELETS 80*                                                          Lab name 07/05/15  0703   SODIUM 138   POTASSIUM 4.2   CREATININE 0.53*   BUN 23   CHLORIDE 110   CO2 17*                            Lab name 07/05/15  0703   AST 28   ALT 32   BILIRUBIN TOTAL 0.9                                                                    Lab name 07/05/15  0702 07/05/15  0703   PREALBUMIN 14.0*  --    ALBUMIN  --  1.6*                                                                                                     Lab name 07/05/15  0703   CALCIUM 7.6*                                                                                               Last POC Glucose:      Component Value Date/Time    POCGMD 178*  07/05/2015 1159    POCGMD 65* 07/05/2015 1121     Last INR:  No results found for: INR, PROTIME    Urine Analysis;   No results found for: COLORU, CLARITYU, PH, PROTEINUA, PHUR, LABSPEC, GLUCOSEU, BLOODU, LEUKOCYTESUR, NITRITE, BILIRUBINUR, UROBILINOGEN, RBCUA, WBCUA, BACTERIA, AMORPHOUS, CRYSTAL, CASTS      Assessment & Plan           Abdominal abscess (07/04/2015)   - continue abx vanc / zosyn  And diflucan         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      Signed:   Mora Bellman, MD  07/08/2015  6:41 PM

## 2015-07-08 NOTE — Care Coordination-Inpatient (Signed)
Case Management spoke with Sudie Grumbling [phone] in admissions at [facility].  Per Sudie Grumbling patient may return to [facility], but will need Precert prior to being accepted back.  Sudie Grumbling to have Precert started today.  Case Management will continue to follow patient and update all disciplines, once Precert has been obtained.        Thank You,    Rexene Edison, RN Case Manager  4th Floor Progressive Care Unit  [phone]

## 2015-07-08 NOTE — Progress Notes (Signed)
Clinical Pharmacy Progress Note    ADMIT DATE: 07/05/2015     45 year old female transferred to [facility] ED from [facility] after becoming hypotensive with significant bleeding from midline dressings. Patient has complex medical and surgical history including modified posterior pelvic exenteration and HIPEC in 2011.  Patient was discharged 3/24 to [facility] after placement of bilateral nephrostomy tubes. Patient received 1 unit of PRBC in ED.    Pharmacy has been consulted to dose TPN by Dr. Graciella Belton with the request to mirror TPN patient was receiving at [facility].    PERTINENT MEDICATIONS:  TPN 2-in-1, 1250 mL/day  Zosyn 3.375g IV q8h EI -- day # 3  Vancomycin 750 mg IV q12h -- day # 3    LABORATORY:        Recent Labs      07/07/15   0450  07/08/15   0515   NA  139  135*   K  3.6  3.7   CL  113*  106   CO2  16*  20*   BUN  14  12   CREATININE  <0.5*  <0.5*   GLUCOSE  186*  112*     Calcium 6.7 (Corrected Ca = 7.94)  Phosphorus 1.7   Magnesium 1.6    Glucoses last 24 hours:  135 - 175 - 186 mg/dL    Recent Labs      07/07/15   0450  07/08/15   0515   WBC  7.7  10.4   HGB  6.4*  10.2*   PLT  101*  130*       VITALS:  Visit Vitals   ??? BP 123/83   ??? Pulse 90   ??? Temp 98.5 ??F (36.9 ??C) (Oral)   ??? Resp 24   ??? Ht 5\' 2"  (1.575 m)   ??? Wt 101 lb 3.1 oz (45.9 kg)   ??? SpO2 100%   ??? BMI 18.51 kg/m2       Intake/Output Summary (Last 24 hours) at 07/08/15 1108  Last data filed at 07/08/15 0946   Gross per 24 hour   Intake           3298.8 ml   Output             3315 ml   Net            -16.2 ml       PROPHYLAXIS:  Stress ulcer: N/A  DVT: enoxaparin 40 mg SQ daily    ASSESSMENT/PLAN:  1. TPN -- day #3  ?? AA 71g, Dextrose 284g, and no lipids  ?? MVI & trace elements will be added to TPN daily.  ?? Electrolytes will be adjusted as appropriate. Today's TPN to contain the following electrolytes:   ?? Na Chloride 50 mEq  ?? Na Acetate 50 mEq  ?? KAcetate  30 mEq  ?? K Phos 18 mmol  ?? Calcium 9.6 mEq  ?? Mag sulfate 15 mEq  Labs will be monitored daily & TPN  re-ordered on a daily basis.    2.  Electrolytes  ?? Phosphorus and magnesium levels low or marginal. Cannot increase either in TPN due to incapatibilities.    ?? Recommendations  ?? Replenish phosphorus with 15 mmol Sodium phosphate   ?? Replenish Magnesium with 2 gm Magnesium Sulfate    3. Uncontrolled pain  ?? Current pain regimen includes  ?? Ofirmev 1 gm q8h  ?? Hydromorphone 0.5 mg q3h moderate pain(4-6)  ?? Hydromorphone 1  mg q3h severe pain (7-10)  ?? Patient rating pain 10/10  ?? Patient refusing to let physicians evaluate 2/2 pain/anxiety  ?? Prior to admission, patient was on MS Contin 30 mg BID, now unable to take due to NPO status.  ?? Recommendations:  ?? If patient remains NPO for forseable future, start fentanyl patch at 25 mcg/hr  ?? If patient able to take PO, restart MS Contin 30 mg q12h    Please call with any questions.      Kermit Balo, Brooke Bonito., PharmD, [place]  Phone: [phone]  07/08/2015 2:08 PM

## 2015-07-08 NOTE — Procedures (Signed)
Erica Harding is a 45 y.o. female patient.  No diagnosis found.  Past Medical History   Diagnosis Date   ??? Colorectal cancer ([place])      Blood pressure 122/82, pulse 94, temperature 98.7 ??F (37.1 ??C), temperature source Oral, resp. rate 13, height 5\' 2"  (1.575 m), weight 101 lb 3.1 oz (45.9 kg), SpO2 100 %.    Central Line  Date/Time: 07/08/2015 3:03 PM  Performed by: Katy Fitch  Authorized by: Katy Fitch   Consent: Verbal consent obtained.  Risks and benefits: risks, benefits and alternatives were discussed  Consent given by: patient  Required items: required blood products, implants, devices, and special equipment available  Indications: vascular access  Sedation:  Patient sedated: no    Preparation: skin prepped with povidone-iodine  Skin prep agent dried: skin prep agent completely dried prior to procedure  Sterile barriers: all five maximum sterile barriers used - cap, mask, sterile gown, sterile gloves, and large sterile sheet  Hand hygiene: hand hygiene performed prior to central venous catheter insertion  Location details: right internal jugular  Patient position: Trendelenburg  Catheter type: triple lumen  Catheter size: 7.5 Fr  Number of attempts: 1  Successful placement: yes  Post-procedure: line sutured and dressing applied  Comments: Line exchange          Katy Fitch, MD  07/08/2015

## 2015-07-08 NOTE — Progress Notes (Signed)
Upon entering room, this RN noticed the pt had removed central line dressing, asking "what is this?" as she was holding the central line in her hand. New CVC dressing applied per protocol. MD notified. Chest X-ray ordered as result to confirm tip placement. Will continue to monitor.

## 2015-07-08 NOTE — Care Coordination-Inpatient (Signed)
Consult to see the patient for pouching suggestions to the draining abdominal wounds. Pt very tearful & refusing for the pouches to be changed. Stating, "I'm absolutely wore out & can't take messing with the bags right now. They have been messing with things all morning. Just leave them alone for now." Encouragement & support given. Explained the importance of re-pouching the wounds. Pt agreed to allow for re-pouching tomorrow. The pouches were re-enforced. Will plan to follow up tomorrow.

## 2015-07-08 NOTE — Progress Notes (Addendum)
Occupational / Physical Therapy  No Treatment    Chart reviewed. Attempted to see pt this AM for OT/PT eval. Pt declined tx d/t 10/10 pain. RN aware. Will attempt to see later this date as schedule allows. Pt noted to have necrotic digits on R foot. Please advise of any WB restrictions.     Elinor Dodge, Student OT   Loralyn Freshwater OTR/L  [place], [place]

## 2015-07-08 NOTE — Care Coordination-Inpatient (Addendum)
[facility]ound Ostomy Continence Nurse  Consult Note       NAME:  Erica Harding  MEDICAL RECORD NUMBER:  [id number]  AGE: 45 y.o.   GENDER: female  DOB: 1970-07-03  TODAY'S DATE:  07/08/2015    Subjective   Reason for WOCN Evaluation and Assessment: consult to see the patient for wound concerns.       Asheley Harding is a 45 y.o. female referred by:   [x]  Physician  []  Nursing  []  Other:     Wound Identification:  Wound Type: arterial, pressure and non-healing surgical  Contributing Factors: decreased mobility, malnutrition, incontinence of stool and incontinence of urine    Wound History: Pt presents with a draining  open incisional wound to the mid abdomen. Loops of bowel visualized. 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 A second open draining incisional wound  assessed to the L flank. Drain to the L lower abdomen. Nephrostomy drains intact bilaterally to the lower back. Stage 2 pressure injury assessed to the coccyx. The toes to the R foot are necrotic, dry & intact.     Current Wound Care Treatment: Eakin pouches applied to the mid abdomen & L lateral flank. Pedi pouches applied to the mucus fistula & the drain site. Pt was premedicated prior to the pouch changes. Recommend Seni Care Protective Barrier to the buttocks every shift & as needed. Encourage frequent position changes & Prevalon boots while in bed.     Patient Goal of Care:  [x]  Wound Healing  []  Odor Control  []  Palliative Care  [x]  Pain Control   []  Other:         PAST MEDICAL HISTORY        Diagnosis Date   ??? Colorectal cancer ([place])        PAST SURGICAL HISTORY    Past Surgical History   Procedure Laterality Date   ??? Colonoscopy     ??? Abdomen surgery     ??? Hysterectomy         FAMILY HISTORY    History reviewed. No pertinent family history.    SOCIAL HISTORY    Social History   Substance Use Topics   ??? Smoking status: Never Smoker   ??? Smokeless tobacco: None   ??? Alcohol use No       ALLERGIES    Allergies   Allergen Reactions   ??? Latex Itching    ??? Compazine [Prochlorperazine Maleate] Swelling       MEDICATIONS    No current facility-administered medications on file prior to encounter.      Current Outpatient Prescriptions on File Prior to Encounter   Medication Sig Dispense Refill   ??? fentaNYL (DURAGESIC) 25 MCG/HR Place 1 patch onto the skin every 72 hours . 10 patch 0   ??? HYDROmorphone (DILAUDID) 1 MG/ML injection Infuse 1 mL intravenously every 2 hours as needed for Pain . 1 mL 0   ??? acetaminophen (OFIRMEV) 10 MG/ML SOLN infusion Infuse 74.1 mLs intravenously every 6 hours as needed for Fever or Pain 4000 mL 0   ??? aspirin 81 MG chewable tablet 1 tablet by Per NG tube route daily 30 tablet 3   ??? LORazepam (ATIVAN) 2 MG/ML injection Infuse 0.5 mLs intravenously every 4 hours as needed (anxiety) 1 mL 0   ??? ipratropium-albuterol (DUONEB) 0.5-2.5 (3) MG/3ML SOLN nebulizer solution Inhale 3 mLs into the lungs every 4 hours as needed for Shortness of Breath 360 mL 0   ???  glucose (GLUTOSE) 40 % GEL Take 15 g by mouth as needed (glc<70) 45 g 1   ??? insulin lispro (HUMALOG) 100 UNIT/ML pen Inject 0-6 Units into the skin every 4 hours 5 Pen 3   ??? ondansetron (ZOFRAN) 4 MG/2ML injection Infuse 2 mLs intravenously every 6 hours as needed for Nausea 15 mL 0   ??? fluconazole (DIFLUCAN) 200MG /100ML IVPB Infuse 100 mLs intravenously every 24 hours 100 mL 0   ??? magnesium sulfate 10-5 MG/ML-% Infuse 100 mLs intravenously as needed (Per IV Magnesium Replacement Protocol) 100 mL 0   ??? potassium chloride 20 MEQ/50ML IVPB Infuse 50 mLs intravenously as needed (Per IV Potassium Replacement Protocol) 50 mL 0   ??? vancomycin (VANCOCIN) 500 MG/100ML IVPB Infuse 100 mLs intravenously every 24 hours 100 mL 0   ??? clopidogrel (PLAVIX) 75 MG tablet 1 tablet by Per NG tube route daily 30 tablet 3   ??? fat emulsion 20 % infusion Infuse 250 mLs intravenously twice a week 250 mL 0   ??? famotidine (PEPCID) 20 MG/2ML injection Infuse 2 mLs intravenously daily 2 mL 0       Objective      Visit  Vitals   ??? BP 122/82   ??? Pulse 94   ??? Temp 98.7 ??F (37.1 ??C) (Oral)   ??? Resp 13   ??? Ht 5\' 2"  (1.575 m)   ??? Wt 101 lb 3.1 oz (45.9 kg)   ??? SpO2 100%   ??? BMI 18.51 kg/m2       LABS:  WBC:    Lab Results   Component Value Date    WBC 10.4 07/08/2015     H/H:    Lab Results   Component Value Date    HGB 10.2 07/08/2015    HCT 29.6 07/08/2015     PTT:    Lab Results   Component Value Date    APTT 37.3 07/05/2015   [APTT}  PT/INR:    Lab Results   Component Value Date    PROTIME 13.7 07/07/2015    INR 1.21 07/07/2015     HgBA1c:  No results found for: LABA1C    Assessment   Braden Risk Score: Braden Scale Score: 14    Patient Active Problem List   Diagnosis Code   ??? AKI (acute kidney injury) ([place]) N17.9   ??? Hyperkalemia E87.5   ??? Open wnd ant abdomen-comp S31.109A   ??? Peritonitis and retroperitoneal infections ([place]) K65.9   ??? 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 Ischemic foot I99.8   ??? Cachexia ([place]) R64   ??? Postprocedural intra-abdominal sepsis ([facility]) T81.4XXA, A41.9   ??? Abscess L02.91   ??? Dehydration E86.0   ??? Encephalopathies G93.40   ??? 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 Abnormal CT of the chest R93.8   ??? Infiltrate noted on imaging study R93.8       Measurements:  Pressure Ulcer 06/25/15 Sacrum Left (Active)   Peri-wound Assessment Pink;Other (Comment) 07/08/2015  2:00 PM   Peri-Wound Color Pink 07/08/2015  2:00 PM   Pressure Ulcer Staging Stage II 07/08/2015  2:00 PM   Assessment Painful;Pale;Red;Other (Comment) 07/08/2015  2:00 PM   Shape irregx2 07/02/2015  2:23 PM   Wound Length (cm) 2 cm 07/08/2015  2:00 PM   Wound Width (cm) 1.5 cm 07/08/2015  2:00 PM   Calculated Wound Size (cm^2) (l*w) 3 cm^2 07/08/2015  2:00 PM   Culture Taken No 07/08/2015  2:00 PM   Dressing Status Intact 07/08/2015 12:00 PM   Dressing Changed Changed/New 07/07/2015  4:26 PM   Dressing/Treatment Open to air;Protective barrier 07/08/2015  2:00 PM   Wound Cleansed Rinsed/Irrigated with saline 07/02/2015  2:23 PM   Dressing Change Due 07/05/15  07/02/2015  2:23 PM   Drainage Amount None 07/02/2015  2:23 PM   Odor None 07/08/2015  2:00 PM   Pink%Wound Bed 100 07/02/2015  2:23 PM   Yellow%Wound Bed 75 07/02/2015  4:00 AM   Margins Attached edges;Unattached edges 07/08/2015  2:00 PM   Number of days:13       Pressure Ulcer 06/25/15 Hip Right (Active)   Peri-wound Assessment Intact;Fragile 07/08/2015  2:00 PM   Peri-Wound Moisture Other(Comment) 07/08/2015  2:00 PM   Pressure Ulcer Staging Unstagable 07/08/2015  2:00 PM   Assessment Slough 07/08/2015  2:00 PM   Shape oval 07/02/2015  2:23 PM   Wound Length (cm) 2 cm 07/08/2015  2:00 PM   Wound Width (cm) 2 cm 07/08/2015  2:00 PM   Calculated Wound Size (cm^2) (l*w) 4 cm^2 07/08/2015  2:00 PM   Change in Wound Size % (l*w) -166.67 07/08/2015  2:00 PM   Dressing Status Intact 07/08/2015  2:00 PM   Dressing Changed Changed/New 07/07/2015  4:26 PM   Dressing/Treatment Foam 07/08/2015 12:00 PM   Wound Cleansed Rinsed/Irrigated with saline 07/02/2015  2:23 PM   Dressing Change Due 07/05/15 07/02/2015  2:23 PM   Necrotic Type Yellow Fibrin/Slough 07/08/2015  2:00 PM   Drainage Amount None 07/02/2015  2:23 PM   Drainage Description Serosanguinous;Yellow 07/08/2015  2:00 PM   Odor None 07/08/2015  2:00 PM   Yellow%Wound Bed 100 07/08/2015  2:00 PM   Margins Attached edges;Unattached edges 07/08/2015  2:00 PM   Number of days:13       Wound 06/25/15 Other (Comment) Abdomen Anterior;Mid midline abdominal surgical incision (Active)   Peri-wound Assessment Pink;Red;Other (Comment) 07/08/2015  2:00 PM   Peri-Wound Texture Localized edema 07/05/2015  9:00 PM   Peri-Wound Moisture Weeping 07/07/2015  8:19 AM   Peri-Wound Color Pink;Other 07/08/2015  2:00 PM   Non-staged Wound Description Full thickness 07/03/2015  3:06 PM   Assessment Fragile;Red;Other (Comment) 07/08/2015  2:00 PM   Shape linear/irreg 07/02/2015  2:23 PM   Wound Length (cm) 12 cm 07/08/2015  2:00 PM   Wound Width (cm) 5 cm 07/08/2015  2:00 PM   Depth (cm)  1 07/08/2015  2:00 PM   Calculated  Wound Size (cm^2) (l*w) 60 cm^2 07/08/2015  2:00 PM   Change in Wound Size % (l*w) -25 07/08/2015  2:00 PM   Closure Other (Comment) 07/08/2015 12:00 PM   Culture Taken No 07/08/2015  2:00 PM   Dressing Status Changed;Other (Comment) 07/08/2015  2:00 PM   Dressing Changed Dressing reinforced 07/08/2015 12:00 PM   Dressing/Treatment Dry dressing;Other (Comment) 07/08/2015 12:00 PM   Wound Cleansed Rinsed/Irrigated with saline 07/08/2015  2:00 PM   Dressing Change Due 07/02/15 07/02/2015  2:23 PM   Drainage Amount Large 07/08/2015  2:00 PM   Drainage Description Brown;Green 07/08/2015  2:00 PM   Odor None 07/08/2015  2:00 PM   Margins Unattached edges 07/08/2015  2:00 PM   Procedural Pain 10 06/25/2015  1:15 PM   Number of days:13       Wound 06/25/15 Other (Comment) Abdomen Left;Lateral open incision to left lateral abdominal wall (Active)   Peri-wound Assessment Intact;Fragile;Pink 07/08/2015  2:00 PM  Peri-Wound Texture Localized edema 07/05/2015  9:00 PM   Peri-Wound Moisture Other(Comment) 07/08/2015  2:00 PM   Peri-Wound Color Pink 07/07/2015  8:19 AM   Non-staged Wound Description Full thickness 07/03/2015 12:52 AM   Assessment Fragile;Pink;Red;Other (Comment) 07/08/2015  2:00 PM   Shape oval 07/02/2015  2:23 PM   Wound Length (cm) 6 cm 07/08/2015  2:00 PM   Wound Width (cm) 1 cm 07/08/2015  2:00 PM   Depth (cm)  1.5 07/08/2015  2:00 PM   Calculated Wound Size (cm^2) (l*w) 6 cm^2 07/08/2015  2:00 PM   Change in Wound Size % (l*w) 0 07/08/2015  2:00 PM   Closure None 07/08/2015 12:00 PM   Dressing Status Intact 07/08/2015  2:00 PM   Dressing Changed Dressing reinforced 07/08/2015 12:00 PM   Dressing/Treatment Other (Comment) 07/08/2015  2:00 PM   Wound Cleansed Rinsed/Irrigated with saline 07/02/2015  2:23 PM   Dressing Change Due 06/29/15 06/29/2015  1:00 PM   Drainage Amount Large 07/08/2015  2:00 PM   Drainage Description Brown;Green 07/08/2015  2:00 PM   Odor None 07/08/2015  2:00 PM   Pink%Wound Bed 100 07/02/2015  2:23 PM   Margins  Unattached edges 07/08/2015  2:00 PM   Number of days:13       Wound 07/05/15 Other (Comment) Foot Right all 5 toes on right foot (Active)   Peri-wound Assessment Black 07/08/2015 12:00 PM   Peri-Wound Moisture Dry;Painful;Swelling 07/07/2015  8:19 AM   Peri-Wound Color Red 07/07/2015  8:19 AM   Assessment Black;Dry;Red 07/08/2015 12:00 PM   Closure Open to air 07/08/2015 12:00 PM   Necrotic Amount Large: 67-100% 07/07/2015  8:19 AM   Drainage Amount None 07/08/2015 12:00 PM   Number of days:2       Mid abd        L flank       Response to treatment:  Well tolerated by patient.     Pain Assessment:  Severity:  10 / 10  Quality of pain: sharp  Wound Pain Timing/Severity: constant  Premedicated: Yes    Plan   Plan of Care: Wound 06/25/15 Other (Comment) Abdomen Anterior;Mid midline abdominal surgical incision-Dressing/Treatment: Dry dressing, Other (Comment) (colostomy collection pouch)  Wound 06/25/15 Other (Comment) Abdomen Left;Lateral open incision to left lateral abdominal wall-Dressing/Treatment: Other (Comment) (Eakin )  Pressure Ulcer 06/25/15 Sacrum Left-Dressing/Treatment: Open to air, Protective barrier  Pressure Ulcer 06/25/15 Hip Right-Dressing/Treatment:  (Santyl daily)    Specialty Bed Required : Yes   [x]  Low Air Loss   []  Pressure Redistribution  []  Fluid Immersion  []  Bariatric  []  Total Pressure Relief  []  Other:     Current Diet: Diet NPO Effective Now Exceptions are: Popsicles, Other (See Comment)  PN-Adult 2-in-1 Central Line Librarian, academic)  PN-Adult 2-in-1 Editor, commissioning)  Dietician consult:  Yes    Discharge Plan:   Placement for patient upon discharge: to be determined    Patient appropriate for Outpatient [place]: Yes    Referrals:  []  Social Worker  []  [place]  []  Supplies  [x]  Other    Patient/Caregiver Teaching:  Level of patient/caregiver understanding able to:   []  Indicates understanding       [x]  Needs reinforcement  []  Unsuccessful      []  Verbal Understanding  []   Demonstrated understanding       []  No evidence of learning  []  Refused teaching         []  N/A  Electronically signed by Wynn Banker, RN, CWON on 07/08/2015 at 2:28 PM

## 2015-07-08 NOTE — Care Coordination-Inpatient (Signed)
Case Management spoke with Transfer center [place] at [phone].  Per [employer] Attending at [facility] declines to accept patient.  Case Management spoke with Dr.  Tresa Garter regarding above information, Case Management did receive call from [facility] admissions liaison [place].  Precert obtained and patient can be accepted back to [place] today.  Case Management contacted [facility] at [phone], will arrive at [facility] at 4 PM to transport patient back to [facility].  RN to call report prior to discharge to [phone].  All paperwork-AVS, COC and orders will be faxed by Case Management to [phone]      Thank You,    Rexene Edison, RN Case Manager  4th Floor Progressive Care Unit  [phone]

## 2015-07-08 NOTE — Plan of Care (Signed)
Problem: Falls - Risk of  Goal: Absence of falls  Outcome: Ongoing  Pt is a fall risk. Fall risk protocol in place. See Leamon Arnt Fall Score. Pt bed is in low position, bed alarm is on, side rails up, fall risk bracelet applied, and non-skid footwear in use. Patient/family educated on fall risk protocol, instructed to call for assistance when needed and belongings are in reach.assistance. Will continue with hourly rounds for po intake, pain needs, toileting and repositioning as needed. Will continue to monitor for needs.    Problem: Risk for Impaired Skin Integrity  Goal: Tissue integrity - skin and mucous membranes  Structural intactness and normal physiological function of skin and  mucous membranes.   Outcome: Ongoing  Pt at risk for skin breakdown. See Braden score. Pt remains on bedrest. Unable to reposition self in bed. Heels elevated off bed. Sacral heart mepilex intact to protect. Will continue to turn and reposition patient every two hours and as needed. Will continue to keep patient clean and dry, applying skin care cream as needed. Pillows used for repositioning q2hs.  Will continue to monitor and assess for skin breakdown.    Problem: Pain Control  Goal: Maintain pain level at or below patient???s acceptable level (or 5 if patient is unable to determine acceptable level)  Outcome: Ongoing  Pt has stated that she is not getting adequate pain relieft. Pt is receiving dilaudid q 3 hrs for pain with little relief. Pt received IV tylenol to help combat pain with little relief. ICU pharmacist consulted for changes to pain control regimen. Will continue to monitor for pain.

## 2015-07-08 NOTE — Progress Notes (Signed)
Pt belongings accounted for with pt. Pt being transferred to [facility] with LDAs intact. Pt signed AVS. Transport arrived. Will help pt with transfer.

## 2015-07-08 NOTE — Progress Notes (Signed)
DEPARTMENT OF SURGERY  GENERAL SURGERY ICU DAILY PROGRESS NOTE  PGY-1    SUBJECTIVE:   Interval history: No active bleeding overnight. Remained hemodynamically stable. Continued abdominal and R foot pain, unchanged. Opening to midline pouch leaking, attempted to control with waterproof tape without success. L abdominal drain with large amount of output and leakage, sutured and pouched with no further issues. Plan for transfer to [facility] or [place].    LINES/ACCESS:   Central Access: R IJ triple lumen 06/26/2015, R SC port                                MEDICATIONS:   Antibiotics: Vancomycin (07/05/2015), Zosyn (07/05/2015)    Continuous Infusions:   ??? PN-Adult 2-in-1 Central Line (Custom) 52.1 mL/hr at 07/07/15 1452     Scheduled Meds:   ??? vancomycin  750 mg Intravenous Q12H   ??? mupirocin  1 g Nasal BID   ??? sodium chloride flush  10 mL Intravenous 2 times per day   ??? enoxaparin  40 mg Subcutaneous Daily   ??? piperacillin-tazobactam  3.375 g Intravenous Q8H        PRN Meds: LORazepam, HYDROmorphone **OR** HYDROmorphone, sodium chloride flush, ondansetron    OBJECTIVE:   Vitals:   Visit Vitals   ??? BP (!) 126/96   ??? Pulse 103   ??? Temp 98.6 ??F (37 ??C) (Oral)   ??? Resp 29   ??? Ht 5\' 2"  (1.575 m)   ??? Wt 101 lb 3.1 oz (45.9 kg)   ??? SpO2 100%   ??? BMI 18.51 kg/m2     I/O:     Intake/Output Summary (Last 24 hours) at 07/08/15 0517  Last data filed at 07/08/15 0400   Gross per 24 hour   Intake           3370.8 ml   Output             4145 ml   Net           -774.2 ml     I/O last 3 completed shifts:  In: 3370.8 [P.O.:180; I.V.:2540; Blood:300]  Out: 3980 [Urine:1440; Drains:2395; Stool:145]    Diet: Diet NPO Effective Now Exceptions are: Popsicles, Other (See Comment)  PN-Adult 2-in-1 Central Line (Custom)      Physical Examination:   General appearance: alert, chronically ill-appearing, cachectic; moaning during examination  Heart: normal rate, regular rhythm, normal; no murmurs, rubs, clicks or gallops  Lungs: clear to  auscultation, no wheezes, crackles, or rhonchi, symmetric air entry  Abdomen: soft, mildly tender, exposed bowel with wound protector in place with waterproof tape, left side ostomy bag pouching open wound, ostomy pink and viable in the right lower quadrant  Genitourinary: nephrostomy tubes in place; pelvic drain in place  Extremities: bilateral PT and DP pulses symmetric; 2nd-5th digits of R foot with dry gangrene within demarcated lines    Labs:  CBC:   Recent Labs      07/05/15   2240  07/06/15   0450  07/07/15   0450   WBC  19.6*  10.4  7.7   HGB  10.2*  7.5*  6.4*   HCT  31.0*  22.2*  19.6*   PLT  70*  54*  101*       Renal function panel:  Lab Results   Component Value Date    CREATININE <0.5 (L) 07/08/2015    BUN 12 07/08/2015  NA 135 (L) 07/08/2015    K 3.7 07/08/2015    CL 106 07/08/2015    CO2 20 (L) 07/08/2015      ASSESSMENT AND PLAN:   Erica Harding is a 45 y.o. African-American female with multiple enteroatmospheric fistulas who was admitted for monitoring s/p controlling a bleeding vessel in the midline wound. Pt condition is stable.     Neurologic: Abdominal pain  - Continue Dilaudid pain panel    Cardiovascular: Troponin elevated on admission, trended down, 0.05 from 0.06, likely due to demand ischemia from low-output state secondary to sepsis  - Continue to monitor     Pulmonary: 100% on room air  - Aggressive pulmonary toilet    Gastrointestinal: Multiple enterocutaneous fistulas  - Continue wound care with saline-soaked Kerlix within appliance  - Continue NPO  - Continue TPN, pharmacy to dose    Genitourinary: Bilateral nephrostomy tubes; UOP 475/490/225  - Both nephrostomy tubes should be attached to Foley bags  - Draining adequately, continue    Fluids:   - Continue TPN, pharmacy to dose    Electrolytes: Hypophosphatemia  - Replace phosphorus to 2.5, potassium to 4.0, and magnesium to 2.00    Nutrition: Hypoalbuminemia  - Albumin improved to 1.1 from 0.7  - Continue TPN      Hematology/Infectious disease: Multiple intra-abdominal abscesses; received 2 units pRBCs yesterday  - WBC increased to 10.4 from 7.7  - Hgb 10.2 from 6.4  - Continue vancomycin and Zosyn  - Continue prophylactic Lovenox    Endocrine: No history of endocrine disorders  - Monitor blood glucose    Prophylaxis: Lovenox, SCDs, PT/OT    Disposition: ICU- stable for discharge to [place] versus transfer to [place] if unable to transfer to [place] Status: Full Code  -----------------------------  Rudy Jew, [name], PGY-1  07/08/2015 5:17 AM  [phone]

## 2015-07-08 NOTE — Discharge Summary (Signed)
Department of Surgery  General Surgery Discharge Summary    Patient: Erica Harding    Admit Date: 07/05/2015  8:44 PM    Discharge Date: 07/05/2015    Admitting Physician: Thana Ates, MD     Discharge Physician: Same    Admitting Diagnosis: Enterocutaneous fistula    Discharge Diagnosis: Same  Past Medical History   Diagnosis Date   ??? Colorectal cancer [facility])       Hospital Course: Pt was transferred from [place] [facility] for bleeding from midline wound. Pt was hemodynamically unstable upon arrival in [facility] ED and was transfused 1 U of pRBCs. Upon evaluation of bleeding, it was noted be arterial and at the skin edge between the skin and the bowel in the midline wound. This was controlled with pressure and surgicel. Pt not noted to be bleeding at any other time during admission. Her hemoglobin initially improved, but then decreased by HD 2. Pt was transfused 2 U of pRBCs after which she has stabilized. Her nurse noted that she had pulled out her right internal jugular central venous catheter on HD 3, so a CXR was obtained. The CXR showed the line was not in the appropriate position, so it was exchanged over a wire. She was then discharged to [facility].    Disposition: [name]    Condition at discharge:  Stable    Discharge Instructions:  See separate form    Rudy Jew, MD, PGY-1  07/08/2015 1:44 PM

## 2015-07-09 LAB — RENAL FUNCTION PANEL W/EGFR
Albumin: 1.5 g/dL (ref 3.5–5.7)
Anion Gap: 8 mmol/L (ref 3–16)
BUN: 14 mg/dL (ref 7–25)
CO2: 22 mmol/L (ref 21–33)
Calcium: 7.6 mg/dL (ref 8.6–10.3)
Chloride: 109 mmol/L (ref 98–110)
Creatinine: 0.49 mg/dL (ref 0.60–1.30)
Glucose: 69 mg/dL (ref 70–100)
Osmolality, Calculated: 287 mOsm/kg (ref 278–305)
Phosphorus: 4 mg/dL (ref 2.1–4.7)
Potassium: 4 mmol/L (ref 3.5–5.3)
Sodium: 139 mmol/L (ref 133–146)
eGFR AA CKD-EPI: >90 See note.
eGFR NONAA CKD-EPI: >90 See note.

## 2015-07-09 LAB — POC GLU MONITORING DEVICE
POC Glucose Monitoring Device: 93 mg/dL (ref 70–100)
POC Glucose Monitoring Device: 96 mg/dL (ref 70–100)
POC Glucose Monitoring Device: 152 mg/dL (ref 70–100)

## 2015-07-09 LAB — CBC
Hematocrit: 31.1 % (ref 35.0–45.0)
Hemoglobin: 10.4 g/dL (ref 11.7–15.5)
MCH: 28.8 pg (ref 27.0–33.0)
MCHC: 33.4 g/dL (ref 32.0–36.0)
MCV: 86.2 fL (ref 80.0–100.0)
MPV: 8.5 fL (ref 7.5–11.5)
Platelets: 156 10*3/uL (ref 140–400)
RBC: 3.61 10*6/uL (ref 3.80–5.10)
RDW: 16.7 % (ref 11.0–15.0)
WBC: 14 10*3/uL (ref 3.8–10.8)

## 2015-07-09 LAB — PREALBUMIN: Prealbumin: 16 mg/dL (ref 17.0–34.0)

## 2015-07-09 LAB — VANCOMYCIN, TROUGH: Vancomycin Tr: 20.2 ug/mL (ref 10.0–20.0)

## 2015-07-09 LAB — DIFFERENTIAL
Basophils Absolute: 14 /uL (ref 0–200)
Basophils Relative: 0.1 % (ref 0.0–1.0)
Eosinophils Absolute: 0 /uL (ref 15–500)
Eosinophils Relative: 0 % (ref 0.0–8.0)
Lymphocytes Absolute: 2968 /uL (ref 850–3900)
Lymphocytes Relative: 21.2 % (ref 15.0–45.0)
Monocytes Absolute: 938 /uL (ref 200–950)
Monocytes Relative: 6.7 % (ref 0.0–12.0)
Neutrophils Absolute: 10080 /uL (ref 1500–7800)
Neutrophils Relative: 72 % (ref 40.0–80.0)
nRBC: 0 /100 WBC (ref 0–0)

## 2015-07-09 LAB — MAGNESIUM: Magnesium: 1.6 mg/dL (ref 1.5–2.5)

## 2015-07-09 MED ORDER — vancomycin (VANCOCIN) 1,000 mg in sodium chloride 0.9 % 100 mL IVPB
INTRAVENOUS | Status: AC
Start: 2015-07-09 — End: 2015-07-09

## 2015-07-09 MED ORDER — TPN ADULT (WCH/Drake)
3 | INTRAVENOUS | Status: AC
Start: 2015-07-09 — End: 2015-07-10
  Administered 2015-07-09: 22:00:00 via INTRAVENOUS

## 2015-07-09 MED ORDER — fentaNYL (DURAGESIC) 50 mcg/hr 1 patch
50 | TRANSDERMAL | Status: AC
Start: 2015-07-09 — End: 2015-07-15
  Administered 2015-07-10 – 2015-07-13 (×2): 1 via TRANSDERMAL

## 2015-07-09 MED ORDER — dextrose 5% in water (D5W) infusion
INTRAVENOUS | Status: AC
Start: 2015-07-09 — End: 2015-07-09
  Administered 2015-07-09: 05:00:00 100 mL/h via INTRAVENOUS

## 2015-07-09 MED ORDER — vancomycin (VANCOCIN) 1,000 mg in sodium chloride 0.9 % 250 mL IVPB
Freq: Once | INTRAVENOUS | Status: AC
Start: 2015-07-09 — End: 2015-07-09
  Administered 2015-07-09: 09:00:00 1000 mg/kg via INTRAVENOUS

## 2015-07-09 MED ORDER — HYDROmorphone (DILAUDID) injection Syrg 1.5 mg
2 | INTRAMUSCULAR | Status: AC | PRN
Start: 2015-07-09 — End: 2015-07-10
  Administered 2015-07-10 (×5): 1.5 mg via INTRAVENOUS

## 2015-07-09 MED ORDER — vancomycin (VANCOCIN) 1,000 mg in sodium chloride 0.9 % 250 mL IVPB
INTRAVENOUS | Status: AC
Start: 2015-07-09 — End: 2015-07-13
  Administered 2015-07-10 – 2015-07-13 (×4): 1000 mg via INTRAVENOUS

## 2015-07-09 MED FILL — VANCOMYCIN 1,000 MG INTRAVENOUS INJECTION: 1000 1000 mg | INTRAVENOUS | Qty: 1000

## 2015-07-09 MED FILL — ONDANSETRON HCL (PF) 4 MG/2 ML INJECTION SOLUTION: 4 4 mg/2 mL | INTRAMUSCULAR | Qty: 2

## 2015-07-09 MED FILL — CLOPIDOGREL 75 MG TABLET: 75 75 mg | ORAL | Qty: 1

## 2015-07-09 MED FILL — HUMULIN R REGULAR U-100 INSULIN 100 UNIT/ML INJECTION SOLUTION: 100 100 unit/mL | INTRAMUSCULAR | Qty: 3

## 2015-07-09 MED FILL — TRAVASOL 10 % INTRAVENOUS SOLUTION: 10 10 % | INTRAVENOUS | Qty: 720

## 2015-07-09 MED FILL — HYDROMORPHONE 1 MG/ML INJECTION SYRINGE: 1 1 mg/mL | INTRAMUSCULAR | Qty: 1

## 2015-07-09 MED FILL — FLUCONAZOLE 200 MG/100 ML IN SOD. CHLORIDE (ISO) INTRAVENOUS PIGGYBACK: 200 200 mg/100 mL | INTRAVENOUS | Qty: 100

## 2015-07-09 MED FILL — HYDROMORPHONE 2 MG/ML INJECTION SYRINGE: 2 2 mg/mL | INTRAMUSCULAR | Qty: 1

## 2015-07-09 MED FILL — ASPIRIN 81 MG CHEWABLE TABLET: 81 81 MG | ORAL | Qty: 1

## 2015-07-09 MED FILL — PIPERACILLIN-TAZOBACTAM 3.375 GRAM INTRAVENOUS SOLUTION: 3.375 3.375 gram | INTRAVENOUS | Qty: 1

## 2015-07-09 MED FILL — FENTANYL 50 MCG/HR TRANSDERMAL PATCH: 50 50 mcg/hr | TRANSDERMAL | Qty: 1

## 2015-07-09 NOTE — Unmapped (Signed)
Hospitalist Progress Note    07/09/2015     7:45 PM    Linda Ponce   LOS: 1 day       HPI;    Principal Problem:    Fistula  Active Problems:    Abdominal abscess    Enterocutaneous fistula    Malnutrition      Subjective & Interval History;    Remains stable on room air, afebrile, complains of uncontrolled pain. Denies shortness of breath      Review of Systems:  As above otherwise negative         PMH: Unchanged from H&P.   PSH: Unchanged from H&P.   Family medical history: Unchanged from H&P.   Social history: Unchanged from H&P      Scheduled Meds:  ??? aspirin  81 mg Per NG tube Daily with breakfast   ??? clopidogrel  75 mg Per NG tube Daily 0900   ??? fentaNYL  1 patch Transdermal Q72H   ??? fluconazole in NaCl (iso-osm)  200 mg Intravenous Q24H   ??? insulin regular  0-5 Units Subcutaneous Q6H   ??? piperacillin-tazobactam (ZOSYN) IV extended interval  3.375 g Intravenous Q8H   ??? [START ON 07/10/2015] vancomycin  1,000 mg Intravenous Q24H     Continuous Infusions:  ??? TPN ADULT ([facility]) 62.5 mL/hr at 07/09/15 1751     PRN Meds:dextrose, dextrose 50 % in water (D50W), HYDROmorphone, ipratropium-albuterol, LORazepam, ondansetron    Objective:    Vital signs in last 24 hours:  Temp:  [97.8 ??F (36.6 ??C)-98.4 ??F (36.9 ??C)] 97.8 ??F (36.6 ??C)  Heart Rate:  [93-113] 93  Resp:  [17-20] 17  BP: (109-113)/(73-74) 113/73 mmHg  FiO2:  [21 %] 21 %    Physical Examination:     GENERAL: The patient is awake, alert, oriented?? not in acute cardiorespiratory distress. ??  HEENT:Atraumatic.Normocephalic. PERRLA, EOMI. No icterus, no pallor, no jugular venous distention. ??  NECK: Supple. Trachea is in midline. No Thyromegaly. No Lymphadenopathy.  CHEST: Symmetric. Clear to auscultation, bilateral equal air entry. No wheezing or rhonchi.  CARDIOVASCULAR: S1, S2, Regular pulse. No murmur or gallops. No JVD.  ABDOMEN: Soft, nontender, nondistended, positive bowel sounds.No viscera palpable.  EXTREMITIES: There is no edema in the lower  extremities.?? Pulses are positive in all extremities.Power is 5/5 in all extremities.(+) dry gangrene R foot  ??    Intake/Output last 3 shifts:  I/O last 3 completed shifts:  In: 1789 [I.V.:1789]  Out: 260 [Urine:260]    Recent Labs;  @SLAB @  No results found for: INR, PROTIME    ABG;  No results found for: PHART, PCO2, PO2ART, HCO3ART, BEART, HBO2PER, O2SATART        Last  Culture and Gram Stain;  No results found for: AEROBOT, ANABOT, LABGRAM, ISO2, ISO3, ISO4, ISO5, ISO6, PNAFISH      Diet;    Diet Orders          TPN ADULT ([facility]) at 62.5 mL/hr starting at 03/28 1800    Diet NPO effective now Except for: except ice chips, except sips of clears starting at 03/28 1610          Future Appointments:  No future appointments.    Assessment/Plan:      Colon CA   - will discuss with sister, s/p radiation , surgery but no records avail      ?? Abdominal abscess (07/04/2015)  ????????????????????????- continue abx vanc / zosyn?? and diflucan, duration depending on clinical / radiographic  improvement.   - wound care        ????????????????????????  ?? 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   Pager# [phone]    07/09/2015

## 2015-07-09 NOTE — Unmapped (Addendum)
Linda    [name]   Wound Care Admission Assessment  07/09/2015       Linda Ponce is an 45 y.o. female.  DOB: 02/27/1971    Pressure ulcers on admission:  One STAGE: III on coccyx              One UNSTAGEABLE on right lateral hip    Diagnosis: jewish 07/05/15 dx: sepsis    Allergies   Allergen Reactions   ??? Compazine [Prochlorperazine Edisylate]    ??? Latex, Natural Rubber        History:    Past Medical History   Diagnosis Date   ??? Cancer    ??? Abscess of abdominal cavity    ??? Gangrene of foot    ??? AKI (acute kidney injury)    ??? Fistula    ??? Peritonitis and retroperitoneal infections    ??? Cachexia    ??? Hyperkalemia    ??? Dehydration    ??? Encephalopathies    ??? Ischemic foot      Past Surgical History   Procedure Laterality Date   ??? Colon surgery       Social History   Substance Use Topics   ??? Smoking status: Never Smoker    ??? Smokeless tobacco: None   ??? Alcohol Use: None     Present admission history of illness/MD note.  41f with history of colorectal CA, initially admitted to [facility] post operatively for continued wound care. Developed bleeding, sent back to [facility] where she was foundto have multiple pelvic abscesses, enterocutaneous fistulas, was kept NPO, started on IV abx, TPN, and returns for continued care.    Skin/Wound Care Assessment.    Braden: 15     Pressure wounds:    Coccyx STAGE:   III     Measures:  L 1.5 cm X W 1 cm X D  0.2 cm.   Wounds progress:   Initial assessment     Wound Assessment:  Appears as shallow Stage 3, open area of moist pink tissue, scant serosanguinous drainage, no odor noted. Edges flat, attached. Periwound skin intact.   Treatment/Dressing: Clean with normal saline, pat dry. Apply Medihoney and cover with Mepilex border. Change every other day and prn dressing failure.     _____________________________________________________________________       Pressure wounds:    Right lateral hip STAGE: Unstagable     Measures:  L 2 cm X W 1.5 cm X D  0.1 cm.   Wounds progress:   Initial  assessment     Wound Assessment:  Moist pink epithelializing tissue with area of dry dark eschar beginning to loosen, scant serosanguinous drainage, no odor noted. Edges flat, attached. Periwound skin intact. Perhaps may be pressure area from nephrostomy tubing. Positioned patient so as not lying on tubing.   Treatment/Dressing:Clean with normal saline, pat dry. Apply Medihoney and cover with Mepilex border. Change every other day and prn dressing failure.            _____________________________________________________________________    Pressure wounds:    Bilateral elbows, areas are both RESOLVED, on previous admission  Right elbow was Stage 1 and left elbow had DTI  Wounds progress:   Initial assessment    Wound Assessment:  Intact skin, some blanchable erythema.  Treatment/Dressing:  Including bilateral elbows daily          ______________________________________________________________________      Surgical wounds:????????????????????????????????????????????????????????????    Mid Abdominal Wound with fistula???? Full Thickness.  Measures:unable to measure  Wounds progress:???? Initial assessment?? ??  Wound Assessment:?? UTA, has intact Eakins pouch in place. Able to visualize moist pink tissue through pouch. Large amount liquid green brown effluent draining into foley bag. No bleeding noted.   Treatment/Dressing: Mid abdominal and left abdominal fistula/wounds being managed with Eakins pouches attached to gravity drainage bags. Clean wounds and surrounding skin with soap and water, pat dry. Attach Eakins pouch using Eakins rings to help with adherence. Use large pouch Eakins # [id number] for mid abdomen and smaller pouch [phone] for left abdomen. Change pouches weekly and prn for leakages.      ????  _____________________________________________________________________  Surgical wounds:????????????  Left Lateral Abdomen Fistula???? Full Thickness. Measures: L6.0?? cm X W 1.5 cm X?? D 1.4cm.?? ??  Wounds progress:???? Initial assessment????   Wound Assessment:?? Full  thickness opening, moist pink tissue with moderate amount of dark colored effluent, edges attached, unable to assess wound well due to patient yelling out. This pouch had leaked this morning and patient covered with effluent. Nursing staff cleaning patient up, patient very upset.   Treatment/Dressing: Mid abdominal and left abdominal fistula/wounds being managed with Eakins pouches attached to gravity drainage bags. Clean wounds and surrounding skin with soap and water, pat dry. Attach Eakins pouch using Eakins rings to help with adherence. Use large pouch Eakins # [id number] for mid abdomen and smaller pouch [phone] for left abdomen. Change pouches weekly and prn for leakages.  ??   _____________________________________________________________________  Surgical wounds:????????????  Drains x 3, left groin, Left mid back and Right mid back..?? ??  Wounds progress:???? Initial assessment?? ??  Wound Assessment: left groin tube is clamped and is contained inside of a small ostomy pouch. No drainage noted. Bilateral tubes in mid back (unsure if these are nephrostomy tubes, no documentation found from [facility]) Left tube intact, site unremarkable, drain attached to drainage bag.  Right tube with blue disk at entry site, appears unremarkable. Also with small amount light brown effluent in bag.  Treatment/Dressing: Bilateral nephrostomy tubes both to drainage pouches. Dressing changes every 3 days and prn for dressing failure. Clean sites with CHG, allow to dry. Apply AMD slit drain gauze to site, cover with Tegaderm. Position tubing so that patient is not lying on tubing.Drain to left groin is currently capped, apply small Hollister pouch [phone] to this , change  pouch weekly and prn dressing failure.    ??    Left Groin drain???????????????????????????????????????????????????????????????????????????????????????????????????????? Left mid backdrain???????????????????????????????????????????? ?????????????????????????????????????????????? Right mid back  drain    _____________________________________________________________________  Vascular Wounds on Right Foot????????    Wounds progress:???? Initial assessment???? ??  Wound Assessment:?? Dry black eschar, no open areas. Eschar has not extended beyond marked line. Has positive pedal pulses.   Treatment/Dressing: betadine paint daily, LOTA        _____________________________________________________________________  Surgical wounds:  Colostomy/mucsous fistula ???? RLQ  Wound Progress: initial assessment  Wound Assessment:??Pouch in place, appears moist pink mucosa through pouch. No effluent noted in pouch  Wound Treatment: Right lower quadrant colostomy currently only functioning as mucous fistula. Keep covered with small ostomy pouch Hollister # [id number]. Change weekly and prn dressing failure.         ______________________________________________________________  Patient very anxious, waited until was premedicated to assess.     Treatment time: 2 nurses approximately 45 min  ????????????????????????    Preventative measures.    Off loading and pressure relief as measures are using an accumax mattress, Turning Wedge, pillows, and waffle boots to  aid in off load of pressure points.  Other measures include lotion's and cream's to Hydrate and moisturize dry skin.  Moisture barrier cream Criticaid clear is provided to protect groin and peri anal area from incontinence. Currently has foley catheter with statlock in place.         Labs:   Lab Results   Component Value Date    GLUCOSE 69* 07/09/2015    BUN 14 07/09/2015    CO2 22 07/09/2015    CREATININE 0.49* 07/09/2015    K 4.0 07/09/2015    NA 139 07/09/2015    CL 109 07/09/2015    CALCIUM 7.6* 07/09/2015     Lab Results   Component Value Date    WBC 14.0* 07/09/2015    HGB 10.4* 07/09/2015    HCT 31.1* 07/09/2015    MCV 86.2 07/09/2015    PLT 156 07/09/2015     No results found for: PTT, INR  Lab Results   Component Value Date    PREALBUMIN 16.0* 07/09/2015     Lab Results   Component Value Date     ALBUMIN 1.5* 07/09/2015     No results found for: ESR, CRP

## 2015-07-09 NOTE — Unmapped (Signed)
[name]  Nutrition Assessment  Known from previous stay on 3N.  Says she can have ice chips and sips of clear liquids.  Likes apple juice.      Diagnosis: jewish 07/05/15 dx: sepsis, Fistula:  H&P pending.  Severe sepsis due to peritonitis - multiple open abd wounds and S/P percutaneous drainage of the pelvic abscess,  RLE ischemia/gangrene - s.o thrombectomy on 3/16 - pt will likely lose toes on R foot - has palpable pulses now on R foot.  ACUTE OUT to [facility] 07/05/15 for large amt bleeding form abd wound/fistula: Leukocytosis per labs and CT scan showed multiple abdominal abscesses with large pelvic abscess . PMH: Cancer, AKI, encephalopathies     Allergies   Allergen Reactions   ??? Compazine [Prochlorperazine Edisylate]    ??? Latex, Natural Rubber      Diet:NPO  Supplement/Tube Feeding/IV: TPN 62.5 ml/h = 1500 ml total volume per day, AA 72 gram, Dextrose 287.7 gram, Lipid 15 gram = 1416.18 kcal  Intake: NPO  Chewing/Swallowing Problems:has her own teeth   Dining Location: room  Religious/Ethnic/Herbal Food Preferences: none reported  Mental Status: A/O  Feeding Ability:can use UE  Bowels:  RLQ Colostomy;   LLQ drain; Bilateral nephrostomy tubes     Weight: 100 lb 4.8 oz (45.496 kg) (07/08/15 1725)   Height: 5' 2.5 (158.8 cm) (07/08/15 1725)   Body mass index is 18.04 kg/(m^2).  Usual Weight: 105#  %UBW:95-96%  Goal Weight: Not < 100# as diagnosis allows  Ideal Weight: 112.5#  %IBW: 89%    Significant Loss/Gain: Appears to have poor LBM    Labs:  Lab Results   Component Value Date    PREALBUMIN 16.0* 07/09/2015     Lab Results   Component Value Date    ALT 32 07/05/2015    AST 28 07/05/2015    ALKPHOS 89 07/05/2015    BILITOT 0.9 07/05/2015     Lab Results   Component Value Date    ALBUMIN 1.5* 07/09/2015     Lab Results   Component Value Date    CREATININE 0.49* 07/09/2015    BUN 14 07/09/2015    NA 139 07/09/2015    K 4.0 07/09/2015    CL 109 07/09/2015    CO2 22 07/09/2015     Lab Results   Component  Value Date    OSMOLALITY 287 07/09/2015     Lab Results   Component Value Date    CALCIUM 7.6* 07/09/2015    PHOS 4.0 07/09/2015    GLUCOSE 69* 07/09/2015     Lab Results   Component Value Date    WBC 14.0* 07/09/2015    HGB 10.4* 07/09/2015    HCT 31.1* 07/09/2015    MCV 86.2 07/09/2015    PLT 156 07/09/2015         Component Value Date/Time    POCGMD 93 07/09/2015 0632    POCGMD 93 07/08/2015 1956    POCGMD 178* 07/05/2015 1159    POCGMD 65* 07/05/2015 1121     Scheduled Meds:  ??? aspirin  81 mg Per NG tube Daily with breakfast   ??? clopidogrel  75 mg Per NG tube Daily 0900   ??? fentaNYL  1 patch Transdermal Q72H   ??? fluconazole in NaCl (iso-osm)  200 mg Intravenous Q24H   ??? insulin regular  0-5 Units Subcutaneous Q6H   ??? piperacillin-tazobactam (ZOSYN) IV extended interval  3.375 g Intravenous Q8H     Continuous Infusions:  ???  dextrose 5% in water 100 mL/hr (07/09/15 0100)   ??? TPN ADULT ([facility])       PRN Meds:.dextrose, dextrose 50 % in water (D50W), HYDROmorphone, ipratropium-albuterol, LORazepam, ondansetron, vancomycin intermittent/pulse dosing    Skin Condition: Ostomy pouch over L lateral abdominal wound, Coccyx wounds Stage 2,  Toes on R foot (black) demarcating with plans for Transmetatarsal amputation.  Right hip unstageable Wound care assessment pending  Edema: none oer nursing notes    Estimated Nutrition Needs:   Kcals/day: 1610-9604  Protein g/day: 55-65  Fluid ml/day:1550  Method for needs estimation  Mifflin-St Jeor formula    based on weight 45.6 kg    Nutrition Diagnosis and Problems:   Nutrition Diagnosis: Altered GI function  Related To: drain abdominal wound  As Evidenced By: need for TPN    Nutrition Prescription:   Food and Nutrition Therapy: Agree with TPN as ordered at this time  Nutrition Education: TPN, role and contact information of RND    Monitoring: Weight and labs    Plan of Care:Implemented    Follow Up: 7-10 Days:     Monico Blitz, RDN, LD  Phone  [phone]  Pager  [phone]  07/09/2015 8:39 AM

## 2015-07-09 NOTE — Unmapped (Signed)
Problem: Potential for Compromised Skin Integrity  Goal: Skin integrity is maintained or improved  Assess and monitor skin integrity. Identify patients at risk for skin breakdown on admission and per policy. Collaborate with interdisciplinary team and initiate plans and interventions as needed.   Outcome: Progressing  Preventative measures.    Off loading and pressure relief as measures are using an accumax mattress, Turning Wedge, pillows, and waffle boots to aid in off load of pressure points.  Other measures include lotion's and cream's to Hydrate and moisturize dry skin.  Moisture barrier cream Criticaid clear is provided to protect groin and peri anal area from incontinence. Currently has foley catheter with statlock in place.

## 2015-07-09 NOTE — Unmapped (Signed)
Pt alert and oriented, vital signs stable, 1mg  Dilaudid given as ordered Q2hr PRN for pain. Pt came with a bag of TPN, hospital policy does not allow to give TPN from another facility, Dr. Elvis Coil notified. House Dr. Dalbert Mayotte contacted regarding TPN, order to run D5W until [facility] pharmacy prepares TPN given, D5W started at 189mL/hr as orderd. Stat vanc. tough collected and sent as ordered, one time vancomycin piggback given as ordered, see MAR. 6 drainage bags noted on pt abdomen, dressing clean dry and intact. Right foot toes necrotic.

## 2015-07-09 NOTE — Unmapped (Signed)
Problem: Fall Prevention  Goal: Patient will remain free of falls  Assess and monitor vitals signs, neurological status including level of consciousness and orientation. Reassess fall risk per hospital policy.    Ensure arm band on, uncluttered walking paths in room, adequate room lighting, call light and overbed table within reach, bed in low position, wheels locked, side rails up per policy, and non-skid footwear provided.   Outcome: Progressing  .  Intervention: Keep call light within reach  .  Intervention: Keep bed in low position  .  Intervention: Offer toileting every 2 hours  .  Intervention: Fall risk sign in place  .

## 2015-07-09 NOTE — Unmapped (Signed)
Pt medicated for pain PRN, approx every 2 hours. Adequate relief with pain med, but pt is extremely anxious.   Placement check of fistula pouch at least every 2 hours, repositioned pouch and drainage tubing to prevent the drainage from pooling on dressing/pouch wafer.   WCT saw pt today and did all assessments/treatments of wounds/open areas.

## 2015-07-09 NOTE — Unmapped (Signed)
I provided patient the [place] Living Will and Power of Attorney paperwork for her to complete at her convenience.    April Ahr   [phone]

## 2015-07-09 NOTE — Unmapped (Signed)
[place]   Social Work Psychosocial Assessment     Linda Ponce  [id number]  45 y.o.  female  Black or African American  Marital Status: Single    jewish 07/05/15 dx: sepsis    Referred by: [facility]  Referred Reason: Continuation of Care      History    Past Medical History   Diagnosis Date   ??? Cancer    ??? Abscess of abdominal cavity    ??? Gangrene of foot    ??? AKI (acute kidney injury)    ??? Fistula    ??? Peritonitis and retroperitoneal infections    ??? Cachexia    ??? Hyperkalemia    ??? Dehydration    ??? Encephalopathies    ??? Ischemic foot        History   Drug Use Not on file       History   Alcohol Use: Not on file       Mental Health History: None    Mental Status    Current Mental Status: Awake, Oriented to Person, Oriented to Place, Oriented to Time, Oriented to Situation  Mental Status Prior to Admission: Oriented to Situation, Oriented to Time, Oriented to Place, Oriented to Person, Awake  Activities of Daily Living: Total Assistance Needed       Current Living Arrangements    Current Living Arrangements: With Family  Type of Living Arrangement: Home/Apartment  Living Arrangements Comments: Patient lives with her sister               Support Systems    Next of Kin/Contact Person: Linda Ponce  Next of Kin Relationship:  Sister  Next of Kin Phone Number: [phone]            [employer] Used Prior to Admission: No             Cultural/Spiritual/Language Barriers    Religious/Cultural Factors: None    Other Pertinent Data                                Case Manager for Mental Health IssuesPrior to Admission: No          Durable Medical Equipment Prior to Admission: No             Name/number of PCP: No PCP  Pharmacy: No Pharmacy    Assessment/Plan    Linda Ponce reports that her sister Linda moved her from [place] on June 14, 2015 so that her sister because her mother was not able to care for her anymore.Linda Ponce reports that she lives with her sister who works at [employer] as a  Engineer, civil (consulting).    Linda Ponce reports that she does not have a PCP or a pharmcy that she goes to because she is new to the area.     Linda Ponce is very upset that she will not get her see her son play Linda Ponce because he is still in [place] with her mother.     I provided Linda Ponce with the Living Will and Health Care POA paperwork for her to complete. Linda Ponce reports that she plans to ask her sister to help her complete them with her.     Patient/Family admission assessment completed. Patient/family denies any case management or social work concerns at this time. Patient/family Educated on the roles/responsibilities of case Production designer, theatre/television/film and Child psychotherapist provided with contact information for Sports coach and Child psychotherapist, rounding  schedule and?? also encouraged to call with any questions or concerns regarding plan of care, advanced directives, discharge planning needs or if uncertain who to contact for medical concerns. Case Management department will continue to follow up regularly with patient/family.     Linda Ponce MSW,LSW  [phone]

## 2015-07-09 NOTE — Unmapped (Signed)
Problem: Fall Prevention  Goal: Patient will remain free of falls  Assess and monitor vitals signs, neurological status including level of consciousness and orientation. Reassess fall risk per hospital policy.    Ensure arm band on, uncluttered walking paths in room, adequate room lighting, call light and overbed table within reach, bed in low position, wheels locked, side rails up per policy, and non-skid footwear provided.   Outcome: Progressing  Patient assessed QS and PRN for fall risk, VS monitored, neuro status, LOC, and orientation. Fall risk armband on, room kept uncluttered, adequate lighting, call light maintained in easy reach. Bed maintained in lowest position, SR up per policy, non-skid footwear provided.

## 2015-07-09 NOTE — Unmapped (Signed)
Clinical Pharmacy Service - Medication Reconciliation    Linda Ponce is a 45 y.o. female admitted on 07/08/2015  4:36 PM from [facility]    PMH:      Past Medical History   Diagnosis Date   ??? Cancer    ??? Abscess of abdominal cavity    ??? Gangrene of foot    ??? AKI (acute kidney injury)    ??? Fistula    ??? Peritonitis and retroperitoneal infections    ??? Cachexia    ??? Hyperkalemia    ??? Dehydration    ??? Encephalopathies    ??? Ischemic foot        Allergies: Compazine and Latex, natural rubber    Medication Discrepancies:  Medication Ordered at Previous Facility Medication Ordered Upon Admission Indication   Vancomycin 750 mg IV q12h Vancomycin 1000 mg IV once -   Hydromorphone 0.5-1 mg IV q3h PRN Hydromorphone 1 mg IV q2h PRN  Moderate pain   Ondansetron 4 mg IV q6h PRN Ondansetron 4 mg IV q8h PRN Nausea   Enoxaparin 40 mg subcutaneous daily  None VTE prophylaxis   - Fluconazole 200 mg IV q24h (therapy not continued at [facility]) Prior to readmit    - ASA chewable tab 81 MG per NG tube  -   APAP 1000 mg IV q8h None Pain   Mupirocin 2% ointment intranasal BID (end date 3/30) None -    Lorazepam 0.5 mg IV q6h PRN Lorazepam 0.5 mg IV q4h PRN Anxiety   - Insulin regular 0-5U subcutaneous q6h Hyperglycemia   - Clopidogrel 75 mg tab per NG tube  -    Pertinent Labs/Vitals:   BP 109/74 mmHg   Pulse 113   Temp(Src) 98.4 ??F (36.9 ??C) (Oral)   Resp 20   Ht 5' 2.5 (1.588 m)   Wt 100 lb 4.8 oz (45.496 kg)   BMI 18.04 kg/m2   SpO2 97%     Assessment:     1) Patient discharged to [facility] for midline bleeding and hypotension on 3/24 and readmitted to [facility] today. Several medications were not re-ordered upon admission to [facility] and no maintenance medications were added to therapy while inpatient there.  2) Upon readmission, all of the patients previous medications were re-ordered appropriately.     Recommendations:     1) Patient is NPO/NGT, therefore unable to have any oral medications. Clopidogrel and ASA are both ordered per NG  tube. Consider discontinuing these medications and initiating prophylactic Enoxaparin 40 mg subcutaneous daily.     2) Patient has poorly controlled pain. Patient was taking Morphine SR 30 mg BID at home. Appropriate conversion to Fentanyl patch 25 mcg/hr was chosen.   Of note, the patient has received Dilaudid 8 mg IV in the past 24 hrs. Consider increasing Fentanyl patch to 50 mcg/hr q72h for better pain control.     Admitting Provider: Corrinne Eagle, MD  Active Orders Reviewed: 18  Time Spent Reviewing Medications: 45 minutes    Thanks,   JENNA ANDREWS, RXT  07/09/2015 11:24 AM     I have read/agree with recommendation.  Aslyn Cottman E Amreen Raczkowski [facility] 07/09/2015 4:01 PM

## 2015-07-09 NOTE — Unmapped (Signed)
Pt NPO, no exceptions, no NGT, unable to give po meds.

## 2015-07-09 NOTE — Unmapped (Signed)
Occupational Therapy   Reason Patient Not Seen         Name: Linda Ponce  DOB: [birth year]  Attending Physician: Corrinne Eagle, MD  Admission Diagnosis: [facility] 07/05/15 dx: sepsis  Date: 07/09/2015  Reviewed Pertinent hospital course: Yes    Unable to see patient due to :MD H & P not completed during OT scheduled eval for this date. Will attempt another date as pt able and OT schedule allows.     Silas Sacramento, MOT, OTR/L  [phone]  Dolan Amen, M-F [phone]  07/09/2015

## 2015-07-09 NOTE — Unmapped (Signed)
Clinical Pharmacy Note    Linda Ponce is a 45 y.o. female that is being followed by the clinical pharmacy department for monitoring and adjustment of vancomycin.    Indication:  Pelvic abscess  Goal trough: 10-15 mg/L per Care Everywhere    Plan:       -level listed below random Vancomycin level resulted 3/27 at 2005 following dose of 750 mg IV q12h at previous facility [facility])  -patient received one time dose of Vancomycin 1 gm IV at 0439 3/28  -start Vancomycin 1000 mg IV q24 h, next dose 3/29 at 0500  -next scheduled trough 4/1 at 0430  -note patient's serum creatinine is not good indicator of renal function as patient debilitated and underweight      CREATININE   Date Value Ref Range Status   07/09/2015 0.49* 0.60 - 1.30 mg/dL Final   16/01/9603 5.40* 0.60 - 1.30 mg/dL Final     WBC   Date Value Ref Range Status   07/09/2015 14.0* 3.8 - 10.8 10E3/uL Final   07/05/2015 10.8 3.8 - 10.8 10E3/uL Final        VANCOMYCIN TR   Date Value Ref Range Status   07/08/2015 20.2* 10.0 - 20.0 ug/mL Final        Smera Guyette E Kiera Hussey [facility] 07/09/2015 4:25 PM         Plan

## 2015-07-10 LAB — POC GLU MONITORING DEVICE
POC Glucose Monitoring Device: 131 mg/dL (ref 70–100)
POC Glucose Monitoring Device: 157 mg/dL (ref 70–100)
POC Glucose Monitoring Device: 174 mg/dL — ABNORMAL HIGH (ref 70–100)
POC Glucose Monitoring Device: 135 mg/dL (ref 70–100)

## 2015-07-10 MED ORDER — TPN ADULT (WCH/Drake)
10 | INTRAVENOUS | Status: AC
Start: 2015-07-10 — End: 2015-07-11
  Administered 2015-07-10: 22:00:00 via INTRAVENOUS

## 2015-07-10 MED ORDER — HYDROmorphone (DILAUDID) injection Syrg 2 mg
2 | INTRAMUSCULAR | Status: AC | PRN
Start: 2015-07-10 — End: 2015-07-14
  Administered 2015-07-10 – 2015-07-14 (×33): 2 mg via INTRAVENOUS

## 2015-07-10 MED FILL — PIPERACILLIN-TAZOBACTAM 3.375 GRAM INTRAVENOUS SOLUTION: 3.375 3.375 gram | INTRAVENOUS | Qty: 1

## 2015-07-10 MED FILL — HYDROMORPHONE 2 MG/ML INJECTION SYRINGE: 2 2 mg/mL | INTRAMUSCULAR | Qty: 1

## 2015-07-10 MED FILL — ASPIRIN 81 MG CHEWABLE TABLET: 81 81 MG | ORAL | Qty: 1

## 2015-07-10 MED FILL — ONDANSETRON HCL (PF) 4 MG/2 ML INJECTION SOLUTION: 4 4 mg/2 mL | INTRAMUSCULAR | Qty: 2

## 2015-07-10 MED FILL — CLOPIDOGREL 75 MG TABLET: 75 75 mg | ORAL | Qty: 1

## 2015-07-10 MED FILL — TRAVASOL 10 % INTRAVENOUS SOLUTION: 10 10 % | INTRAVENOUS | Qty: 720

## 2015-07-10 MED FILL — VANCOMYCIN 1,000 MG INTRAVENOUS INJECTION: 1000 1000 mg | INTRAVENOUS | Qty: 1000

## 2015-07-10 MED FILL — FLUCONAZOLE 200 MG/100 ML IN SOD. CHLORIDE (ISO) INTRAVENOUS PIGGYBACK: 200 200 mg/100 mL | INTRAVENOUS | Qty: 100

## 2015-07-10 NOTE — Unmapped (Signed)
Occupational Therapy  Occupational Therapy Initial Assessment     Name: Linda Ponce  DOB: [birth year]  Attending Physician: Corrinne Eagle, MD  Admission Diagnosis: [facility] 07/05/15 dx: sepsis  Date: 07/10/2015    Reviewed Pertinent hospital course: Yes  Hospital Course PT/OT: 61f with history of colorectal CA, initially admitted to [facility] post operatively for continued wound care. Developed bleeding, sent back to [facility] where she was foundto have multiple pelvic abscesses, enterocutaneous fistulas, was kept NPO, started on IV abx, TPN, and returns for continued care.    Assessment     Assessment: Decreased ADL status, Decreased self-care trans, Decreased Functional Mobility, Decreased activity tolerance  Prognosis: Guarded  Goal Formulation: Other (comment) (per therapist )    Goals  Pt Will demonstrate functional chair transfer:  (tolerate assessment; by July 17, 2015)  Pt Will participate in upper extremity HEP to prep for ADLs: strengthening / activity tolerance for functional use: Ongoing   Miscellaneous Goal #1: Patient will tolerate sitting EOB with maximal assist: By July 17, 2015  Long Term Goal : Patient to be seen 2-3 week trial period to determine if she can participate and benefit from active OT programming  Time frame for goals to be met in: 3 weeks     Recommendation  Plan  Treatment Interventions: Compensatory technique education, Therapeutic Activity, Patient/Family training, Excercise, ADL retraining, Functional transfer training, Activity Tolerance training, Energy Conservation  OT Frequency: 1-3x/wk        Problem List  Patient Active Problem List   Diagnosis   ??? Abdominal abscess   ??? Enterocutaneous fistula   ??? Malnutrition   ??? Fistula        Past Medical History  Past Medical History   Diagnosis Date   ??? Cancer    ??? Abscess of abdominal cavity    ??? Gangrene of foot    ??? AKI (acute kidney injury)    ??? Fistula    ??? Peritonitis and retroperitoneal infections    ??? Cachexia    ??? Hyperkalemia    ??? Dehydration     ??? Encephalopathies    ??? Ischemic foot         Past Surgical History  Past Surgical History   Procedure Laterality Date   ??? Colon surgery          Patient Stated Goals  Goal #1: Patient states I want to do it. I don't want anyone to say I refused any kind of care but then is unable to particpate with full evaluation due to pain and other concerns (eg. my ostomy will come loose)       Home Living/Prior Function  Type of Home:  (Patient c/o pain and unable to obtain a definite history. Pt focused on pain in LLE.)  Additional Comments: Patient moved her to be in town with her sister, unable to obtain home environemtn   Prior Function  Level of Independence: Independent     Pain  Pain Score: 10-Worst pain ever  Pain Location: Generalized (all over )  Pain Descriptors: Aching;Sharp  Pain Intervention(s): Medication (See eMAR)  Therapist reported pain to:: Nursing medicated patient for pain and returned one hour later, patient screamed when writer applied lotion to (L) LE per patient request      Vision  Current Vision: No visual deficits    Cognition  Overall Cognitive Status: Impaired (Patient appears confusional at times, patient has a significant pain medication regimen )    Sensation  Light Touch:  (hypersensitivity  in (L) LE unable to tolerate aseessment of UEs )  Sharp/Dull: No apparent deficits    Proprioception  Proprioception  Proprioception: No apparent deficits    Perception  Perception  Inattention/Neglect: Appears intact    Right Upper Extremity   RUE Assessment:  (Has at least anti-gravity strength in all joints and planes, unable to tolerate MMT, some limitations at shoulders approx 135 degrees flexion and abd )         Left Upper Extremity            Hand Function  Gross Grasp: Functional  Coordination:  (continue to assess )        Functional Mobility  Bed Mobility Eval  Rolling:  (refused all functional mobility )  Supine to Sit:  (attempted, limited by pain)  Sit to Supine: Unable to assess  (Comment)  Balance Eval  Sitting - Static:  (UTA)    ADLs  Eating Assistance: Total  Eating Deficit: NPO  Additional Comments: Patient requires maximal assistance with self-care at this time     Patient Education  Role of OT, and plan of care,  scheduling/frequency in LTAC        OT orders received & acknowledged. Chart reviewed & initial eval completed. See above for pt's current status, OT Plan of Care and goals.     Informed consent was obtained.   Pt may be seen by another Occupational Therapist or Media planner during their stay.       Amy Cherre Blanc, OTR/L [phone]  LTAC at the [facility]  Tues., Wed. and Friday 9:00-5:30  Pager #: [phone]         Time  Start Time: 1550  Stop Time: 1605  Time Calculation (min): 15 min    Charges  $OT Evaluation Low Complex 30 Min: 1 Procedure

## 2015-07-10 NOTE — Unmapped (Signed)
Pt alert and oriented, in constant pain Dilaudid 1.5mg  given as ordered PRN Q2hr.

## 2015-07-10 NOTE — Unmapped (Signed)
AOx4. On RA. Tolerating TPN and IV antibiotics. Complained of generalized pain. PRN pain medicine given. Positive results noted. Refused morning pills. Pleasant, compliant with care. Anxious. Vitals WDL> will continue to monitor.  Blood pressure 102/68, pulse 84, temperature 97.3 ??F (36.3 ??C), temperature source Axillary, resp. rate 17, height 5' 2.5 (1.588 m), weight 100 lb 4.8 oz (45.496 kg), SpO2 100 %.  [name] Toree Edling

## 2015-07-10 NOTE — Unmapped (Signed)
Clinical Pharmacy Service - TPN Progress Note    Linda Ponce is a 45 y.o. female followed by the pharmacy department for monitoring and adjustment of TPN    Patient is NPO    Plan:    -no new TPN labs to review today  -continue current continuous TPN-no changes  -routine TPN labs due tomorrow  -formulation as follows:    TPN Electrolytes  ??Sodium Chloride: 40 mEq  ??Sodium Acetate: 60 mEq  ??Sodium Phosphate: 0 mmol  ??Magnesium Sulfate: 16 mEq  ??Calcium Gluconate: 4.8 mEq  ??Potassium Chloride: 30 mEq  ??Potassium Acetate: 0 mEq  ??Potassium Phosphate: 18 mmol?? TPN Macronutrients  ??Volume: 1500 mL  ??Rate: 62.5 mL/hr  ??Amino Acids: 72 grams/day (48 g/L) ??  ??Dextrose: 288 grams/day (192 g/L) ??  ??Lipids: 15 grams/day?? (10 g/L) ?? TPN Additives  ??MVI 10 mL  ??TE 1 mL??           Linda Ponce Linda Ponce Linda Ponce 07/10/2015 8:38 AM

## 2015-07-10 NOTE — Unmapped (Signed)
Problem: Fall Prevention  Goal: Patient will remain free of falls  Assess and monitor vitals signs, neurological status including level of consciousness and orientation. Reassess fall risk per hospital policy.    Ensure arm band on, uncluttered walking paths in room, adequate room lighting, call light and overbed table within reach, bed in low position, wheels locked, side rails up per policy, and non-skid footwear provided.   Outcome: Progressing  .  Intervention: Keep call light within reach  .  Intervention: Keep bed in low position  .  Intervention: Offer toileting every 2 hours  .

## 2015-07-10 NOTE — Unmapped (Signed)
Problem: Fall Prevention  Goal: Patient will remain free of falls  Assess and monitor vitals signs, neurological status including level of consciousness and orientation. Reassess fall risk per hospital policy.    Ensure arm band on, uncluttered walking paths in room, adequate room lighting, call light and overbed table within reach, bed in low position, wheels locked, side rails up per policy, and non-skid footwear provided.   Outcome: Progressing  Pt free from falls at this time. Fall prevention policy maintained during shift.

## 2015-07-10 NOTE — Unmapped (Signed)
Problem: Occupational Therapy  Goal: Misc 1  Problem: Decreased ADLs, functional mobility and ADL transfers     Pt Will demonstrate functional chair transfer: Contact Guard (by July 24, 2015)  Pt Will demonstrate toilet transfer: Contact Guard (by July 24, 2015)  Pt Will demonstrate UE ADLs: Stand by assist (and setup sitting EOB, by July 24, 2015)  Pt Will be alert and oriented: x3 (with minimal cueing by July 24, 2015)  Miscellaneous Goal #1: Patient will tolerate sitting at EOB for 8-10 minutes with SBA: by July 24, 2015  Miscellaneous Goal #2: Patient will perform sit <> stand for LB ADLs with CGA: by July 24, 2015  Long Term Goal : Patient will perform ADLs with no more than minimal assist by August 10, 2015  Time frame for goals to be met in: 4 weeks / August 10, 2015  Outcome: Progressing  Patient to be seen 1-3x weekly for the following:     ADL retraining, IADL retraining, Functional Teacher, early years/pre, Activity Tolerance Training, Energy Conservation, Patient / Family Education, Therapeutic Activity, Therapeutic Exercise,  Compensatory Technique education, Discharge and Equipment Recommendations

## 2015-07-10 NOTE — Unmapped (Signed)
Hospitalist Progress Note    07/10/2015     1:18 PM    Linda Ponce   LOS: 2 days       HPI;    Principal Problem:    Fistula  Active Problems:    Abdominal abscess    Enterocutaneous fistula    Malnutrition      Subjective & Interval History;    Remains stable on room air, afebrile, pain  Control improved.      Review of Systems:  As above otherwise negative         PMH: Unchanged from H&P.   PSH: Unchanged from H&P.   Family medical history: Unchanged from H&P.   Social history: Unchanged from H&P      Scheduled Meds:  ??? aspirin  81 mg Per NG tube Daily with breakfast   ??? clopidogrel  75 mg Per NG tube Daily 0900   ??? fentaNYL  1 patch Transdermal Q72H   ??? fluconazole in NaCl (iso-osm)  200 mg Intravenous Q24H   ??? insulin regular  0-5 Units Subcutaneous Q6H   ??? piperacillin-tazobactam (ZOSYN) IV extended interval  3.375 g Intravenous Q8H   ??? vancomycin  1,000 mg Intravenous Q24H     Continuous Infusions:  ??? TPN ADULT ([facility]) 62.5 mL/hr at 07/09/15 1751   ??? TPN ADULT ([facility])       PRN Meds:dextrose, dextrose 50 % in water (D50W), HYDROmorphone, ipratropium-albuterol, LORazepam, ondansetron    Objective:    Vital signs in last 24 hours:  Temp:  [97.3 ??F (36.3 ??C)-97.5 ??F (36.4 ??C)] 97.3 ??F (36.3 ??C)  Heart Rate:  [83-87] 84  Resp:  [16-20] 17  BP: (102-104)/(66-69) 102/68 mmHg  FiO2:  [21 %] 21 %    Physical Examination:     GENERAL: The patient is awake, alert, oriented?? not in acute cardiorespiratory distress. ??  HEENT:Atraumatic.Normocephalic. PERRLA, EOMI. No icterus, no pallor, no jugular venous distention. ??  NECK: Supple. Trachea is in midline. No Thyromegaly. No Lymphadenopathy.  CHEST: Symmetric. Clear to auscultation, bilateral equal air entry. No wheezing or rhonchi.  CARDIOVASCULAR: S1, S2, Regular pulse. No murmur or gallops. No JVD.  ABDOMEN: Soft, nontender, nondistended, positive bowel sounds.No viscera palpable.  EXTREMITIES: There is no edema in the lower extremities.?? Pulses are positive  in all extremities.Power is 5/5 in all extremities.(+) dry gangrene R foot  ??    Intake/Output last 3 shifts:  I/O last 3 completed shifts:  In: 3105 [I.V.:2347]  Out: 260 [Urine:260]    Recent Labs;  @SLAB @  No results found for: INR, PROTIME    ABG;  No results found for: PHART, PCO2, PO2ART, HCO3ART, BEART, HBO2PER, O2SATART        Last  Culture and Gram Stain;  No results found for: AEROBOT, ANABOT, LABGRAM, ISO2, ISO3, ISO4, ISO5, ISO6, PNAFISH      Diet;    Diet Orders          TPN ADULT ([facility]) at 62.5 mL/hr starting at 03/29 1800    TPN ADULT ([facility]) at 62.5 mL/hr starting at 03/28 1800    Diet NPO effective now Except for: except ice chips, except sips of clears starting at 03/28 5784          Future Appointments:  No future appointments.    Assessment/Plan:      Colon CA   - will discuss with sister, s/p radiation , surgery but no records avail      ?? Abdominal abscess (07/04/2015)  ????????????????????????-  continue abx vanc / zosyn?? and diflucan, duration depending on clinical / radiographic improvement.   - wound care        ????????????????????????  ?? 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   Pager# [phone]    07/10/2015

## 2015-07-10 NOTE — Unmapped (Signed)
Clinical Pharmacy Service - TPN Progress Note    Linda Ponce is a 45 y.o. female followed by the pharmacy department for monitoring and adjustment of TPN    Patient is NPO    Assessment/Plan:       ?? TPN volume from previous facility [facility]) adjusted to accommodate 10% amino acid component and allow room for sterile water flexibility  ?? Incorporated lipids 100 gm/week in formula as per previous facility (at [facility]-250 ml 20% lipids twice weekly)  ?? Increased sodium acetate to 60 mEq as CO2 at low range of normal  ?? Decreased NaCL to 40 mEq as exchange  ?? Macro nutrients as per previous facility  ?? Formulation listed below    TPN Electrolytes   Sodium Chloride: 40 mEq   Sodium Acetate: 60 mEq   Sodium Phosphate: 0 mmol   Magnesium Sulfate: 16 mEq   Calcium Gluconate: 4.8 mEq   Potassium Chloride: 30 mEq   Potassium Acetate: 0 mEq   Potassium Phosphate: 18 mmol TPN Macronutrients   Volume: 1500 mL   Rate: 62.5 mL/hr   Amino Acids: 72 grams/day (48 g/L)    Dextrose: 288 grams/day (192 g/L)    Lipids: 15 grams/day  (10 g/L)  TPN Additives   MVI 10 mL   TE 1 mL     Subjective/Objective Information:    Recent Labs      07/09/15   0544   NA  139   CL  109   CO2  22   BUN  14   CREATININE  0.49*   K  4.0   PHOS  4.0   MG  1.6   CALCIUM  7.6*   ALBUMIN  1.5*   PREALBUMIN  16.0*   GLUCOSE  69*     Recent Labs      07/08/15   1956  07/09/15   0632   POCGMD  93  93     Corrected Calcium =  9.6 (0.8*(4mg /dL - Albumin))+Serum Ca     Thank you for the consult,  Quita Skye Idalys Konecny, RPh   07/09/2015 8:38 AM

## 2015-07-10 NOTE — Unmapped (Addendum)
Physical Therapy  Physical Therapy Initial Assessment     Name: Linda Ponce  DOB: Feb 12, 1971  Attending Physician: Corrinne Eagle, MD  Admission Diagnosis: [facility] 07/05/15 dx: sepsis  Date: 07/10/2015    Reviewed Pertinent hospital course: Yes  Hospital Course PT/OT: 53f with history of colorectal CA, initially admitted to [facility] post operatively for continued wound care. Developed bleeding, sent back to [facility] where she was foundto have multiple pelvic abscesses, enterocutaneous fistulas, was kept NPO, started on IV abx, TPN, and returns for continued care.  Assessment  Pt. Evaluation completed to the best of the patient's ability.  Pt unable to tolerated all mobility due to limits of pain and patient crying out in pain.  She is very anxious and required repeated verbal prompting to stay calm.         Goals  Miscellaneous Goal #1: Eval only. Pt unable to tolerated therapy due to limits of pain.    Recommendation     PT Eval  Only. No therapy recommended due to patient unable to tolerate.     Problem List  Patient Active Problem List   Diagnosis   ??? Abdominal abscess   ??? Enterocutaneous fistula   ??? Malnutrition   ??? Fistula      Past Medical History  Past Medical History   Diagnosis Date   ??? Cancer    ??? Abscess of abdominal cavity    ??? Gangrene of foot    ??? AKI (acute kidney injury)    ??? Fistula    ??? Peritonitis and retroperitoneal infections    ??? Cachexia    ??? Hyperkalemia    ??? Dehydration    ??? Encephalopathies    ??? Ischemic foot       Past Surgical History  Past Surgical History   Procedure Laterality Date   ??? Colon surgery       Patient Stated Goals  Goal #1: Get better   Home Living/Prior Function  Type of Home:  (Patient c/o pain and unable to obtain a definite history. Pt focused on pain in LLE.)  Additional Comments: Patient c/o pain and unable to obtain a definite history. Pt focused on pain in LLE.  Level of Independence: Independent     Pain  Pain Score:   9  Pain Location: Leg  Pain Descriptors:  Constant;Aching  Pain Intervention(s): Other (Comment) (Medication)  Therapist reported pain to:: Nursing and pain medication given    Vision  Current Vision: No visual deficits    Cognition  Overall Cognitive Status: Within Functional Limits    Sensation  Light Touch: No apparent deficits  Sharp/Dull: No apparent deficits  Proprioception: No apparent deficits  Inattention/Neglect: Appears intact    Upper Extremity  Unable to assess due to pain.              Lower Extremity  RLE Assessment  RLE Assessment:  (PROM OF KNEE/HIP; LIMITED BY PAIN)  LLE Assessment  LLE Assessment:  (PROM OF KNEE/HIP; LIMITED BY PAIN)   Skin: right foot toes are black    Functional Mobility  Bed Mobility Eval  Rolling: Max assist to left;Dependent (attempted, limited by pain)  Supine to Sit:  (attempted, limited by pain)  Sit to Supine: Unable to assess (Comment)  Balance Eval  Sitting - Static:  (UTA)     Patient Education   Education on her current medical status and limited ability to participate in PT due to severe pain.  Time  Start Time: 46  Stop Time: 1135  Time Calculation (min): 30 min    Charges   $PT Evaluation High Complex 45 Min: 1 Procedure       Eustaquio Boyden, MPT, [id number]

## 2015-07-11 LAB — BASIC METABOLIC PANEL
Anion Gap: 7 mmol/L (ref 3–16)
BUN: 24 mg/dL (ref 7–25)
CO2: 27 mmol/L (ref 21–33)
Calcium: 7.6 mg/dL (ref 8.6–10.3)
Chloride: 108 mmol/L (ref 98–110)
Creatinine: 0.52 mg/dL (ref 0.60–1.30)
Glucose: 107 mg/dL (ref 70–100)
Osmolality, Calculated: 299 mOsm/kg (ref 278–305)
Potassium: 3.6 mmol/L (ref 3.5–5.3)
Sodium: 142 mmol/L (ref 133–146)
eGFR AA CKD-EPI: >90 See note.
eGFR NONAA CKD-EPI: >90 See note.

## 2015-07-11 LAB — AMMONIA: Ammonia: 88 ug/dL (ref 27–90)

## 2015-07-11 LAB — POC GLU MONITORING DEVICE
POC Glucose Monitoring Device: 93 mg/dL (ref 70–100)
POC Glucose Monitoring Device: 270 mg/dL (ref 70–100)
POC Glucose Monitoring Device: 68 mg/dL — ABNORMAL LOW (ref 70–100)
POC Glucose Monitoring Device: 69 mg/dL — ABNORMAL LOW (ref 70–100)
POC Glucose Monitoring Device: 112 mg/dL (ref 70–100)

## 2015-07-11 LAB — MAGNESIUM: Magnesium: 1.8 mg/dL (ref 1.5–2.5)

## 2015-07-11 LAB — PHOSPHORUS: Phosphorus: 2.6 mg/dL (ref 2.1–4.7)

## 2015-07-11 LAB — CULTURE BLOOD #1: Blood Culture, Routine: NO GROWTH

## 2015-07-11 MED ORDER — alteplase (CATHFLO) 2 mg injection
2 | Status: AC
Start: 2015-07-11 — End: 2015-07-11
  Administered 2015-07-11: 07:00:00 2

## 2015-07-11 MED ORDER — alteplase (CATHFLO) injection 2 mg
2 | Status: AC | PRN
Start: 2015-07-11 — End: 2015-09-13
  Administered 2015-08-05 – 2015-08-29 (×2): 2 mg

## 2015-07-11 MED ORDER — TPN ADULT (WCH/Drake)
10 | INTRAVENOUS | Status: AC
Start: 2015-07-11 — End: 2015-07-12
  Administered 2015-07-11: 21:00:00 via INTRAVENOUS

## 2015-07-11 MED ORDER — ALPRAZolam (XANAX) tablet 0.25 mg
0.5 | Freq: Three times a day (TID) | ORAL | Status: AC | PRN
Start: 2015-07-11 — End: 2015-09-13

## 2015-07-11 MED FILL — VANCOMYCIN 1,000 MG INTRAVENOUS INJECTION: 1000 1000 mg | INTRAVENOUS | Qty: 1000

## 2015-07-11 MED FILL — PIPERACILLIN-TAZOBACTAM 3.375 GRAM INTRAVENOUS SOLUTION: 3.375 3.375 gram | INTRAVENOUS | Qty: 1

## 2015-07-11 MED FILL — HYDROMORPHONE 2 MG/ML INJECTION SYRINGE: 2 2 mg/mL | INTRAMUSCULAR | Qty: 1

## 2015-07-11 MED FILL — LORAZEPAM 2 MG/ML INJECTION SOLUTION: 2 2 mg/mL | INTRAMUSCULAR | Qty: 1

## 2015-07-11 MED FILL — ASPIRIN 81 MG CHEWABLE TABLET: 81 81 MG | ORAL | Qty: 1

## 2015-07-11 MED FILL — FLUCONAZOLE 200 MG/100 ML IN SOD. CHLORIDE (ISO) INTRAVENOUS PIGGYBACK: 200 200 mg/100 mL | INTRAVENOUS | Qty: 100

## 2015-07-11 MED FILL — CLOPIDOGREL 75 MG TABLET: 75 75 mg | ORAL | Qty: 1

## 2015-07-11 MED FILL — CATHFLO ACTIVASE 2 MG INTRA-CATHETER SOLUTION: 2 2 mg | Qty: 2

## 2015-07-11 MED FILL — TRAVASOL 10 % INTRAVENOUS SOLUTION: 10 10 % | INTRAVENOUS | Qty: 720

## 2015-07-11 NOTE — Unmapped (Signed)
Clinical Pharmacy Note - TPN Order    Linda Ponce is a 45 y.o. female that is being followed by the clinical pharmacy department for monitoring and adjustment of TPN.    Patient is NPO with ice chips.    Plan:    -decrease sodium acetate to 50 mEq  -increase potassium to 30 mEq  -increase potassium phosphate to 22 mM  -adjusted calcium 9.6; no adjustement  -continue current continuous TPN with above changes  -formulation listed below:       TPN Electrolytes  ??Sodium Chloride: 40 mEq  ??Sodium Acetate: 50 mEq  ??Sodium Phosphate: 0 mmol  ??Magnesium Sulfate: 16 mEq  ??Calcium Gluconate: 4.8 mEq  ??Potassium Chloride: 30 mEq  ??Potassium Acetate: 0 mEq  ??Potassium Phosphate: 22 mmol???? TPN Macronutrients  ??Volume: 1500 mL  ??Rate: 62.5 mL/hr  ??Amino Acids: 72 grams/day (48 g/L) ??  ??Dextrose: 288 grams/day (192 g/L) ??  ??Lipids: 15 grams/day?? (10 g/L) ???? TPN Additives  ??MVI 10 mL  ??TE 1 mL????   ??                      SODIUM   Date Value Ref Range Status   07/11/2015 142 133 - 146 mmol/L Final     CHLORIDE   Date Value Ref Range Status   07/11/2015 108 98 - 110 mmol/L Final     CO2   Date Value Ref Range Status   07/11/2015 27 21 - 33 mmol/L Final     POTASSIUM   Date Value Ref Range Status   07/11/2015 3.6 3.5 - 5.3 mmol/L Final     PHOSPHORUS   Date Value Ref Range Status   07/11/2015 2.6 2.1 - 4.7 mg/dL Final     CALCIUM   Date Value Ref Range Status   07/11/2015 7.6* 8.6 - 10.3 mg/dL Final     GLUCOSE   Date Value Ref Range Status   07/11/2015 107* 70 - 100 mg/dL Final     Vann Okerlund E Maryanne Huneycutt RPH 07/11/2015 10:02 AM

## 2015-07-11 NOTE — Unmapped (Signed)
Pt alert and oriented, anxious, complaining of pain and nausea, 2mg  Dilaudid given as ordered q2hr  PRN for pain, 4mg  Zofran given as ordered PRN for nausea and 0.5mg  Ativan given as ordered PRN for anxiety. Pt remained awake, complaining of pain and expressed fear of being alone. Frequency of rounding increased to ease anxiety associated with loneliness. Right Port A cath accessed, no blood return noted, sister mentioned that PAC has not had blood return in a while, nurses at [facility] hospital tried to get blood return but they were not successful. Order for cathflo obtained and cathflo instealo for 1hr, still no blood return noted. Press photographer and day shift RN notified.

## 2015-07-11 NOTE — Unmapped (Signed)
Problem: Potential for Compromised Skin Integrity  Goal: Skin integrity is maintained or improved  Assess and monitor skin integrity. Identify patients at risk for skin breakdown on admission and per policy. Collaborate with interdisciplinary team and initiate plans and interventions as needed.   Outcome: Progressing  Skin integrity maintained per AM skin assessment, repositioning q2hrs.

## 2015-07-11 NOTE — Unmapped (Signed)
Problem: Discharge Planning  Goal: Patient???s discharge needs are met  Collaborate with interdisciplinary team and initiate plans and interventions as needed.   Outcome: Progressing  Ongoing discharge planning    Comments:   The case management department provided an update regarding patient???s plan of care today during Interdisciplinary Rounds.    With: Therapy, WCT, Dietician, Nursing, RT, SW, RNCM    Continue abx vanc / zosyn  and diflucan, duration depending on clinical / radiographic improvement.  TPN with ice chips and sips of clear liquid only d/t fistulas.  Therapy evals completed and patient is not appropriate for therapy at this time d/t wounds and pain level.  WCT reports pouching of fistulas are working well.    Discharge recommendation is pending    Barriers to discharge at this time include: extensive wound care, pain, IV medications

## 2015-07-11 NOTE — Unmapped (Signed)
Pt alert, anxiuos. On RA. Complained of generalized pain, PRN pain medicine given. Positive results noted. Refused PO pills. Vitals WDL. Bed in lowest position compliant with care. Tolerating IV antibiotics. Will continue to monitor.  Blood pressure 109/89, pulse 115, temperature 98.4 ??F (36.9 ??C), temperature source Axillary, resp. rate 16, height 5' 2.5 (1.588 m), weight 100 lb 4.8 oz (45.496 kg), SpO2 100 %.  [name] Kimaria Struthers

## 2015-07-11 NOTE — Unmapped (Signed)
Hospitalist Progress Note    07/11/2015     3:10 PM    Linda Ponce   LOS: 3 days       HPI;    Principal Problem:    Fistula  Active Problems:    Abdominal abscess    Enterocutaneous fistula    Malnutrition      Subjective & Interval History;    Anxious per staff, denies worsening / uncontrolled abdominal pain, shortness of breath, n/v      Review of Systems:  As above otherwise negative         PMH: Unchanged from H&P.   PSH: Unchanged from H&P.   Family medical history: Unchanged from H&P.   Social history: Unchanged from H&P      Scheduled Meds:  ??? aspirin  81 mg Per NG tube Daily with breakfast   ??? clopidogrel  75 mg Per NG tube Daily 0900   ??? fentaNYL  1 patch Transdermal Q72H   ??? fluconazole in NaCl (iso-osm)  200 mg Intravenous Q24H   ??? insulin regular  0-5 Units Subcutaneous Q6H   ??? piperacillin-tazobactam (ZOSYN) IV extended interval  3.375 g Intravenous Q8H   ??? vancomycin  1,000 mg Intravenous Q24H     Continuous Infusions:  ??? TPN ADULT ([facility]) 62.5 mL/hr at 07/10/15 1740   ??? TPN ADULT ([facility])       PRN Meds:ALPRAZolam, alteplase, dextrose, dextrose 50 % in water (D50W), HYDROmorphone, ipratropium-albuterol, LORazepam, ondansetron    Objective:    Vital signs in last 24 hours:  Temp:  [98 ??F (36.7 ??C)-98.6 ??F (37 ??C)] 98.4 ??F (36.9 ??C)  Heart Rate:  [93-115] 115  Resp:  [15-17] 16  BP: (107-111)/(67-89) 109/89 mmHg    Physical Examination:     GENERAL: The patient is awake, alert, oriented?? not in acute cardiorespiratory distress. ??  HEENT:Atraumatic.Normocephalic. PERRLA, EOMI. No icterus, no pallor, no jugular venous distention. ??  NECK: Supple. Trachea is in midline. No Thyromegaly. No Lymphadenopathy.  CHEST: Symmetric. Clear to auscultation, bilateral equal air entry. No wheezing or rhonchi.  CARDIOVASCULAR: S1, S2, Regular pulse. No murmur or gallops. No JVD.  ABDOMEN: Soft, nontender, nondistended, positive bowel sounds.No viscera palpable.  EXTREMITIES: There is no edema in the lower  extremities.?? Pulses are positive in all extremities.Power is 5/5 in all extremities.(+) dry gangrene R foot  ??    Intake/Output last 3 shifts:  I/O last 3 completed shifts:  In: 3929 [P.O.:360; I.V.:1310]  Out: 1325 [Urine:1325]    Recent Labs;  @SLAB @  No results found for: INR, PROTIME    ABG;  No results found for: PHART, PCO2, PO2ART, HCO3ART, BEART, HBO2PER, O2SATART        Last  Culture and Gram Stain;  No results found for: AEROBOT, ANABOT, LABGRAM, ISO2, ISO3, ISO4, ISO5, ISO6, PNAFISH      Diet;    Diet Orders          TPN ADULT ([facility]) at 62.5 mL/hr starting at 03/30 1800    TPN ADULT ([facility]) at 62.5 mL/hr starting at 03/29 1800    Diet NPO effective now Except for: except ice chips, except sips of clears starting at 03/28 1610          Future Appointments:  No future appointments.    Assessment/Plan:      Colon CA   - will discuss with sister, s/p radiation , surgery but no records avail      ?? Abdominal abscess (07/04/2015)  ????????????????????????-  continue abx vanc / zosyn?? and diflucan, duration depending on clinical / radiographic improvement.   - wound care        ????????????????????????  ?? 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   Pager# [phone]    07/11/2015

## 2015-07-11 NOTE — Unmapped (Signed)
Problem: Potential for Compromised Skin Integrity  Goal: Skin integrity is maintained or improved  Assess and monitor skin integrity. Identify patients at risk for skin breakdown on admission and per policy. Collaborate with interdisciplinary team and initiate plans and interventions as needed.   Outcome: Progressing  .  Intervention: Turn patient  .  Intervention: Relieve pressure to bony prominences  Provide measures to decrease pressure to skin such as specialty mattresses, egg crate mattresses and beds, elbow and heel protectors.   .  Intervention: Avoid shearing  Use lift devices to decrease friction and shearing.   .  Intervention: Keep skin clean and dry  .  Intervention: Alternate a full bath with partial baths for elderly  Bath water should be luke warm.   .  Intervention: Encourage use of lotion/moisturizer on skin  .  Intervention: Monitor patient???s hygiene practices  Monitor patient???s hygiene practices to include water temperature, kind of soap used, and frequency of cleansing.   .

## 2015-07-12 LAB — POC GLU MONITORING DEVICE
POC Glucose Monitoring Device: 104 mg/dL — ABNORMAL HIGH (ref 70–100)
POC Glucose Monitoring Device: 108 mg/dL (ref 70–100)
POC Glucose Monitoring Device: 115 mg/dL (ref 70–100)
POC Glucose Monitoring Device: 108 mg/dL (ref 70–100)

## 2015-07-12 MED ORDER — TPN ADULT (WCH/Drake)
10 | INTRAVENOUS | Status: AC
Start: 2015-07-12 — End: 2015-07-13
  Administered 2015-07-12: 23:00:00 via INTRAVENOUS

## 2015-07-12 MED FILL — PIPERACILLIN-TAZOBACTAM 3.375 GRAM INTRAVENOUS SOLUTION: 3.375 3.375 gram | INTRAVENOUS | Qty: 1

## 2015-07-12 MED FILL — ASPIRIN 81 MG CHEWABLE TABLET: 81 81 MG | ORAL | Qty: 1

## 2015-07-12 MED FILL — HYDROMORPHONE 2 MG/ML INJECTION SYRINGE: 2 2 mg/mL | INTRAMUSCULAR | Qty: 1

## 2015-07-12 MED FILL — FENTANYL 50 MCG/HR TRANSDERMAL PATCH: 50 50 mcg/hr | TRANSDERMAL | Qty: 1

## 2015-07-12 MED FILL — FLUCONAZOLE 200 MG/100 ML IN SOD. CHLORIDE (ISO) INTRAVENOUS PIGGYBACK: 200 200 mg/100 mL | INTRAVENOUS | Qty: 100

## 2015-07-12 MED FILL — VANCOMYCIN 1,000 MG INTRAVENOUS INJECTION: 1000 1000 mg | INTRAVENOUS | Qty: 1000

## 2015-07-12 MED FILL — TRAVASOL 10 % INTRAVENOUS SOLUTION: 10 10 % | INTRAVENOUS | Qty: 720

## 2015-07-12 MED FILL — CLOPIDOGREL 75 MG TABLET: 75 75 mg | ORAL | Qty: 1

## 2015-07-12 NOTE — Unmapped (Signed)
Problem: Fall Prevention  Goal: Patient will remain free of falls  Assess and monitor vitals signs, neurological status including level of consciousness and orientation. Reassess fall risk per hospital policy.    Ensure arm band on, uncluttered walking paths in room, adequate room lighting, call light and overbed table within reach, bed in low position, wheels locked, side rails up per policy, and non-skid footwear provided.   Outcome: Progressing  .  Intervention: Keep call light within reach  .

## 2015-07-12 NOTE — Unmapped (Signed)
Clinical Pharmacy Service - TPN Progress Note    Linda Ponce is a 45 y.o. female followed by the pharmacy department for monitoring and adjustment of TPN    Assessment/Plan:   1) Continue continuous TPN with electrolytes, macronutrients and additives from yesterday  2) Electrolytes listed in chart below:  3) No labs, no changes to TPN  4) Pharmacy will continue to monitor electrolytes and make necessary adjustments.      TPN Electrolytes   Sodium Chloride: 40 mEq   Sodium Acetate: 50 mEq   Sodium Phosphate: 0 mmol   Magnesium Sulfate: 16 mEq   Calcium Gluconate: 4.8 mEq   Potassium Chloride: 30 mEq   Potassium Acetate: 0 mEq   Potassium Phosphate: 22 mmol TPN Macronutrients   Volume: 1500 mL   Rate: 62.5 mL/hr   Amino Acids: 48 g/L    Dextrose: 192 g/L    Lipids: 10 g/L  TPN Additives   MVI 10 mL   TE 1 mL     Subjective/Objective Information:    Recent Labs      07/11/15   0528   NA  142   CL  108   CO2  27   BUN  24   CREATININE  0.52*   K  3.6   PHOS  2.6   MG  1.8   CALCIUM  7.6*   GLUCOSE  107*     Recent Labs      07/11/15   1235  07/11/15   1603  07/11/15   2339  07/12/15   0544   POCGMD  68*  112*  104*  108*     Corrected Calcium = (0.8*(4mg /dL - Albumin))+Serum Ca     Thank you for the consult,  [place] D PHELPS, PHARMD   07/12/2015 9:51 AM

## 2015-07-12 NOTE — Unmapped (Signed)
Refused aspirin/plavix 2/2 fear of choking. Offered to crush and admin in applesauce, pt considering but did not want at this time. Sticky note left on summary page for MD.

## 2015-07-12 NOTE — Unmapped (Signed)
Patient is alert and oriented. Resting quietly in bed. No signs of injury. Pt on call light every three hours asking pain medicine. Prn pain  meds were given and effective. All pt's drain bags intact and the two neprostomy bags are in place and draining clear yellow urine.  Bed in low position. Call light within reach.     Filed Vitals:    07/12/15 0000   BP: 103/65   Pulse: 100   Temp: 98.6 ??F (37 ??C)   Resp: 18   SpO2: 95%     Gweneth Fritter, RN  07/12/2015  4:42 AM

## 2015-07-12 NOTE — Unmapped (Addendum)
Hospitalist Progress Note    07/12/2015     7:25 AM    Maricella Hochstein   LOS: 4 days       HPI;    Principal Problem:    Fistula  Active Problems:    Abdominal abscess    Enterocutaneous fistula    Malnutrition      Subjective & Interval History;    Anxious per staff, denies worsening / uncontrolled abdominal pain, shortness of breath, n/v      Review of Systems:  As above otherwise negative         PMH: Unchanged from H&P.   PSH: Unchanged from H&P.   Family medical history: Unchanged from H&P.   Social history: Unchanged from H&P      Scheduled Meds:  ??? aspirin  81 mg Per NG tube Daily with breakfast   ??? clopidogrel  75 mg Per NG tube Daily 0900   ??? fentaNYL  1 patch Transdermal Q72H   ??? fluconazole in NaCl (iso-osm)  200 mg Intravenous Q24H   ??? insulin regular  0-5 Units Subcutaneous Q6H   ??? piperacillin-tazobactam (ZOSYN) IV extended interval  3.375 g Intravenous Q8H   ??? vancomycin  1,000 mg Intravenous Q24H     Continuous Infusions:  ??? TPN ADULT ([facility]) 62.5 mL/hr at 07/11/15 1702     PRN Meds:ALPRAZolam, alteplase, dextrose, dextrose 50 % in water (D50W), HYDROmorphone, ipratropium-albuterol, LORazepam, ondansetron    Objective:    Vital signs in last 24 hours:  Temp:  [98 ??F (36.7 ??C)-98.6 ??F (37 ??C)] 98.1 ??F (36.7 ??C)  Heart Rate:  [100-115] 107  Resp:  [16-20] 18  BP: (103-128)/(65-89) 111/73 mmHg    Physical Examination:     GENERAL: The patient is awake, alert, oriented?? not in acute cardiorespiratory distress. Cachectic looking  HEENT:Atraumatic.Normocephalic. PERRLA, EOMI. No icterus, no pallor, no jugular venous distention. ??  NECK: Supple. Trachea is in midline. No Thyromegaly. No Lymphadenopathy.  CHEST: Symmetric. Clear to auscultation, bilateral equal air entry. No wheezing or rhonchi.  CARDIOVASCULAR: S1, S2, Regular pulse. No murmur or gallops. No JVD.  ABDOMEN: Soft, nontender, Eakins pouch in place, left lateral fistula bag  EXTREMITIES: There is no edema in the lower extremities.?? Pulses are  positive in all extremities.Power is 5/5 in all extremities.(+) dry gangrene R foot  ??    Intake/Output last 3 shifts:  I/O last 3 completed shifts:  In: 3093 [P.O.:840; I.V.:752]  Out: 2450 [Urine:1450; Drains:1000]    Lab Results   Component Value Date    WBC 14.0* 07/09/2015    RBC 3.61* 07/09/2015    HCT 31.1* 07/09/2015    MCV 86.2 07/09/2015    MCH 28.8 07/09/2015    MCHC 33.4 07/09/2015    RDW 16.7* 07/09/2015    PLT 156 07/09/2015     Lab Results   Component Value Date    GLUCOSE 107* 07/11/2015    BUN 24 07/11/2015    CO2 27 07/11/2015    CREATININE 0.52* 07/11/2015    K 3.6 07/11/2015    NA 142 07/11/2015    CL 108 07/11/2015    CALCIUM 7.6* 07/11/2015         Diet Orders          TPN ADULT ([facility]) at 62.5 mL/hr starting at 03/30 1800    Diet NPO effective now Except for: except ice chips, except sips of clears starting at 03/28 0959          Future  Appointments:  No future appointments.    Assessment/Plan:      Colon CA   s/p radiation , surgery       ?? Abdominal abscess (07/04/2015)  ????????????????????????- continue abx vanc / zosyn?? and diflucan, duration depending on clinical / radiographic improvement.   - wound care        ????????????????????????  ?? 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 Off load    Prophylaxis;  GI Prophylaxis: Pepcid  ??DVT Prophylaxis: SCDs     Code Status: Full Code  Fredrich Birks MD.     07/12/2015

## 2015-07-12 NOTE — Unmapped (Signed)
AOx4.  Medicated PRN for pain with effect as charted. Denies nausea, SOB, dizziness, palpitations. Pt in generally good spirits and enthusiastic about rehab plan of care although still with some anxiety. Easily reassured with clear explanation of treatments. Encouraged to elevate LLE to reduce edema. All dressings/drains CDI.      jewish 07/05/15 dx: sepsis    Patient Active Problem List    Diagnosis Date Noted   ??? 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 Malnutrition 07/08/2015   ??? Fistula 07/08/2015   ??? Abdominal abscess 07/04/2015       Filed Vitals:    07/12/15 0000 07/12/15 0600 07/12/15 1124 07/12/15 1900   BP: 103/65 111/73 107/76 105/69   Pulse: 100 107 121 103   Temp: 98.6 ??F (37 ??C) 98.1 ??F (36.7 ??C) 97.6 ??F (36.4 ??C) 98.3 ??F (36.8 ??C)   TempSrc: Axillary Axillary Oral Oral   Resp: 18 18 18 18    Height:       Weight:       SpO2: 95% 92% 100% 98%       Does the patient's have any transfers/appointments or testing scheduled -   No future appointments.

## 2015-07-13 LAB — POC GLU MONITORING DEVICE
POC Glucose Monitoring Device: 105 mg/dL (ref 70–100)
POC Glucose Monitoring Device: 113 mg/dL — ABNORMAL HIGH (ref 70–100)
POC Glucose Monitoring Device: 120 mg/dL (ref 70–100)
POC Glucose Monitoring Device: 116 mg/dL — ABNORMAL HIGH (ref 70–100)

## 2015-07-13 LAB — VANCOMYCIN, TROUGH: Vancomycin Tr: 16.1 ug/mL (ref 10.0–20.0)

## 2015-07-13 MED ORDER — vancomycin (VANCOCIN) 750 mg in sodium chloride 0.9 % 150 mL IVPB
INTRAVENOUS | Status: AC
Start: 2015-07-13 — End: 2015-07-23
  Administered 2015-07-14 – 2015-07-23 (×10): 750 mg via INTRAVENOUS

## 2015-07-13 MED ORDER — TPN ADULT (WCH/Drake)
10 | INTRAVENOUS | Status: AC
Start: 2015-07-13 — End: 2015-07-14
  Administered 2015-07-13: 22:00:00 via INTRAVENOUS

## 2015-07-13 MED FILL — HYDROMORPHONE 2 MG/ML INJECTION SYRINGE: 2 2 mg/mL | INTRAMUSCULAR | Qty: 1

## 2015-07-13 MED FILL — ASPIRIN 81 MG CHEWABLE TABLET: 81 81 MG | ORAL | Qty: 1

## 2015-07-13 MED FILL — PIPERACILLIN-TAZOBACTAM 3.375 GRAM INTRAVENOUS SOLUTION: 3.375 3.375 gram | INTRAVENOUS | Qty: 1

## 2015-07-13 MED FILL — CLOPIDOGREL 75 MG TABLET: 75 75 mg | ORAL | Qty: 1

## 2015-07-13 MED FILL — VANCOMYCIN 1,000 MG INTRAVENOUS INJECTION: 1000 1000 mg | INTRAVENOUS | Qty: 1000

## 2015-07-13 MED FILL — FLUCONAZOLE 200 MG/100 ML IN SOD. CHLORIDE (ISO) INTRAVENOUS PIGGYBACK: 200 200 mg/100 mL | INTRAVENOUS | Qty: 100

## 2015-07-13 MED FILL — VANCOMYCIN 1,000 MG INTRAVENOUS INJECTION: 1000 1000 mg | INTRAVENOUS | Qty: 750

## 2015-07-13 MED FILL — TRAVASOL 10 % INTRAVENOUS SOLUTION: 10 10 % | INTRAVENOUS | Qty: 720

## 2015-07-13 NOTE — Unmapped (Signed)
Patient alert and oriented x4 upon assessment this evening. Pt still anxious but refused to take xanax tablet. Pt said she is scared to take pills, she had a bad  Experience with pills where she choked on a pill. Receiving pain medicine per prn orders. No acute resp distress noted, call light is within reach. Pt's drains are intact.

## 2015-07-13 NOTE — Unmapped (Signed)
Clinical Pharmacy Note (TPN Dosing)    No labs to review today.      Reviewed chart for any dietary recommendations and other problems.    No changes to TPN    Linda Ponce  07/13/2015  8:04 AM

## 2015-07-13 NOTE — Unmapped (Signed)
Patient alert and oriented x4.  All medications and wound care tolerated well.  Vitals are stable and no signs of distress noted at this time. PRN for pain given Q2hrs.  Pt only got little relief between doses.  Call light and bedside table are within reach.  Bed in low position, wheels locked, side rails per policy.  Non skid footwear is provided.  Patient is able to make needs known.

## 2015-07-13 NOTE — Unmapped (Signed)
Hospitalist Progress Note    07/13/2015     12:29 PM    Doralene Vanleer   LOS: 5 days       HPI;    Principal Problem:    Fistula  Active Problems:    Abdominal abscess    Enterocutaneous fistula    Malnutrition      Subjective & Interval History;    No cp sob fever nvd anxious about meds refuses asa      Review of Systems:  As above otherwise negative         PMH: Unchanged from H&P.   PSH: Unchanged from H&P.   Family medical history: Unchanged from H&P.   Social history: Unchanged from H&P      Scheduled Meds:  ??? aspirin  81 mg Per NG tube Daily with breakfast   ??? clopidogrel  75 mg Per NG tube Daily 0900   ??? fentaNYL  1 patch Transdermal Q72H   ??? fluconazole in NaCl (iso-osm)  200 mg Intravenous Q24H   ??? insulin regular  0-5 Units Subcutaneous Q6H   ??? piperacillin-tazobactam (ZOSYN) IV extended interval  3.375 g Intravenous Q8H   ??? vancomycin  1,000 mg Intravenous Q24H     Continuous Infusions:  ??? TPN ADULT ([facility]) 62.5 mL/hr at 07/12/15 1830   ??? TPN ADULT ([facility])       PRN Meds:ALPRAZolam, alteplase, dextrose, dextrose 50 % in water (D50W), HYDROmorphone, ipratropium-albuterol, LORazepam, ondansetron    Objective:    Vital signs in last 24 hours:  Temp:  [97.6 ??F (36.4 ??C)-98.3 ??F (36.8 ??C)] 97.6 ??F (36.4 ??C)  Heart Rate:  [79-117] 79  Resp:  [18] 18  BP: (105-116)/(69-86) 114/69 mmHg    Physical Examination:     GENERAL:awake, alert, oriented?? not in acute cardiorespiratory distress. Cachectic looking  HEENT:Atraumatic.Normocephalic. PERRLA, EOMI. No icterus, no pallor, no jugular venous distention. ??  NECK: Supple. Trachea is in midline. No Thyromegaly. No Lymphadenopathy.  CHEST: Symmetric. Clear to auscultation, bilateral equal air entry. No wheezing or rhonchi.  CARDIOVASCULAR: ns1s2 no mrg rrr  ABDOMEN: ntnd bs+ soft  EXTREMITIES: There is no edema in the lower extremities.?? Pulses are positive in all extremities.Power is 5/5 in all extremities.(+) dry gangrene R foot  ??    Intake/Output last 3  shifts:  I/O last 3 completed shifts:  In: 3460.2 [P.O.:960; I.V.:118; IV Piggyback:200]  Out: 2820 [Urine:1470; Drains:1350]    Lab Results   Component Value Date    WBC 14.0* 07/09/2015    RBC 3.61* 07/09/2015    HCT 31.1* 07/09/2015    MCV 86.2 07/09/2015    MCH 28.8 07/09/2015    MCHC 33.4 07/09/2015    RDW 16.7* 07/09/2015    PLT 156 07/09/2015     Lab Results   Component Value Date    GLUCOSE 107* 07/11/2015    BUN 24 07/11/2015    CO2 27 07/11/2015    CREATININE 0.52* 07/11/2015    K 3.6 07/11/2015    NA 142 07/11/2015    CL 108 07/11/2015    CALCIUM 7.6* 07/11/2015         Diet Orders          TPN ADULT ([facility]) at 62.5 mL/hr starting at 04/01 1800    TPN ADULT ([facility]) at 62.5 mL/hr starting at 03/31 1800    Diet NPO effective now Except for: except ice chips, except sips of clears starting at 03/28 [phone]  Future Appointments:  No future appointments.    Assessment/Plan:    ????  ?? 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 Abdominal abscess (07/04/2015)abx vanc / zosyn?? and diflucan, duration depending on clinical / radiographic improvement. wound care????????????????  Prophylaxis;  GI Prophylaxis: Pepcid  ??DVT Prophylaxis: SCDs     Code Status: Full Code  Barb Merino MD.     07/13/2015

## 2015-07-13 NOTE — Unmapped (Signed)
Clinical Pharmacy Note    Linda Ponce is a 45 y.o. female that is being followed by the clinical pharmacy department for monitoring and adjustment of vancomycin.       Indication:?? Pelvic abscess  Goal trough: 10-15 mg/L per Care Everywhere  Stop date:  TBD-not specified per order    Plan:    -reduce Vancomycin to 750 mg IV q24H  -next scheduled trough 4/5 at 0430  -per discharge summary from [facility]--recommend to continue ABs for at least two weeks (admitted back here 3/27; estimated stop date around 4/10)              CREATININE   Date Value Ref Range Status   07/11/2015 0.52* 0.60 - 1.30 mg/dL Final   16/01/9603 5.40* 0.60 - 1.30 mg/dL Final   98/02/9146 8.29* 0.60 - 1.30 mg/dL Final     WBC   Date Value Ref Range Status   07/09/2015 14.0* 3.8 - 10.8 10E3/uL Final   07/05/2015 10.8 3.8 - 10.8 10E3/uL Final        VANCOMYCIN TR   Date Value Ref Range Status   07/13/2015 16.1 10.0 - 20.0 ug/mL Final        Drucilla Cumber E Bao Bazen RPH 07/13/2015 1:16 PM

## 2015-07-13 NOTE — Unmapped (Signed)
Problem: Fall Prevention  Goal: Patient will remain free of falls  Assess and monitor vitals signs, neurological status including level of consciousness and orientation. Reassess fall risk per hospital policy.    Ensure arm band on, uncluttered walking paths in room, adequate room lighting, call light and overbed table within reach, bed in low position, wheels locked, side rails up per policy, and non-skid footwear provided.   Outcome: Progressing  Arm band is secure, uncluttered walking pathways and adequate room lighting is maintained.  Call light and bed side table are within reach.  Bed is in low position, wheels locked, side rails per policy.  Non skid footwear is provided.

## 2015-07-14 LAB — POC GLU MONITORING DEVICE
POC Glucose Monitoring Device: 110 mg/dL — ABNORMAL HIGH (ref 70–100)
POC Glucose Monitoring Device: 121 mg/dL — ABNORMAL HIGH (ref 70–100)
POC Glucose Monitoring Device: 111 mg/dL — ABNORMAL HIGH (ref 70–100)
POC Glucose Monitoring Device: 105 mg/dL — ABNORMAL HIGH (ref 70–100)

## 2015-07-14 MED ORDER — HYDROmorphone (DILAUDID) injection Syrg 4 mg
2 | INTRAMUSCULAR | Status: AC | PRN
Start: 2015-07-14 — End: 2015-07-22
  Administered 2015-07-14 – 2015-07-22 (×46): 4 mg via INTRAVENOUS

## 2015-07-14 MED ORDER — TPN ADULT (WCH/Drake)
10 | INTRAVENOUS | Status: AC
Start: 2015-07-14 — End: 2015-07-15
  Administered 2015-07-14: 22:00:00 via INTRAVENOUS

## 2015-07-14 MED FILL — HYDROMORPHONE 2 MG/ML INJECTION SYRINGE: 2 2 mg/mL | INTRAMUSCULAR | Qty: 1

## 2015-07-14 MED FILL — HYDROMORPHONE 2 MG/ML INJECTION SYRINGE: 2 2 mg/mL | INTRAMUSCULAR | Qty: 2

## 2015-07-14 MED FILL — PIPERACILLIN-TAZOBACTAM 3.375 GRAM INTRAVENOUS SOLUTION: 3.375 3.375 gram | INTRAVENOUS | Qty: 1

## 2015-07-14 MED FILL — CLOPIDOGREL 75 MG TABLET: 75 75 mg | ORAL | Qty: 1

## 2015-07-14 MED FILL — TRAVASOL 10 % INTRAVENOUS SOLUTION: 10 10 % | INTRAVENOUS | Qty: 720

## 2015-07-14 MED FILL — VANCOMYCIN 1,000 MG INTRAVENOUS INJECTION: 1000 1000 mg | INTRAVENOUS | Qty: 750

## 2015-07-14 MED FILL — FLUCONAZOLE 200 MG/100 ML IN SOD. CHLORIDE (ISO) INTRAVENOUS PIGGYBACK: 200 200 mg/100 mL | INTRAVENOUS | Qty: 100

## 2015-07-14 MED FILL — ASPIRIN 81 MG CHEWABLE TABLET: 81 81 MG | ORAL | Qty: 1

## 2015-07-14 NOTE — Unmapped (Signed)
Nursing Clinical Progress Note        Patient Active Problem List    Diagnosis Date Noted   ??? 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 Malnutrition 07/08/2015   ??? Fistula 07/08/2015   ??? Abdominal abscess 07/04/2015       Filed Vitals:    07/13/15 1143 07/13/15 1741 07/13/15 2103 07/14/15 0610   BP: 114/69 106/71 123/70 98/63   Pulse: 79 79 121 109   Temp: 97.6 ??F (36.4 ??C) 97.5 ??F (36.4 ??C) 98.4 ??F (36.9 ??C) 97.4 ??F (36.3 ??C)   TempSrc: Oral Axillary Oral    Resp: 18 18 18 20    Height:       Weight:       SpO2: 100% 100% 100% 92%       Lines/Drains/Access - Yes   If yes, please list  rt ij, foley, colostomy    Does the patient have any changes to their condition warranting intervention? No       RN Shift Note Additional: pt is in no distress, respirations easy and unlabored w/skin warm, pink and dry.  Pt is able to make needs known.     Does the patient's have any transfers/appointments or testing scheduled -   No future appointments.    Patient/Family questions - No    Mcarthur Rossetti, RN

## 2015-07-14 NOTE — Unmapped (Signed)
Pain management summary:  Noted increase in dose, discussed w/pt.  Pt verbalized concerns over pain being controlled versus having to take medication.  Also verbalized concerns regarding missing her father's funeral 2 weeks ago, missing work in [place], having relocated to [place], and her 43 yo son remaining in [place]. W/her mother.  Pt refuses to be turned, c/o of pain in her abdomen. Encourage pt to turn and reposition.

## 2015-07-14 NOTE — Unmapped (Signed)
Monitor pain dilaudid 4 mg q4 prn. Consider increase fentanyl patch

## 2015-07-14 NOTE — Unmapped (Signed)
Hospitalist Progress Note    07/14/2015     6:10 PM    Linda Ponce   LOS: 6 days       HPI;    Principal Problem:    Fistula  Active Problems:    Abdominal abscess    Enterocutaneous fistula    Malnutrition      Subjective & Interval History;    C/O ABD PAIN UNRELEIVED WITH DILAUDID 2   anxious about meds   REFUSES ASA  Review of Systems:  As above otherwise negative         PMH: Unchanged from H&P.   PSH: Unchanged from H&P.   Family medical history: Unchanged from H&P.   Social history: Unchanged from H&P      Scheduled Meds:  ??? aspirin  81 mg Per NG tube Daily with breakfast   ??? clopidogrel  75 mg Per NG tube Daily 0900   ??? fentaNYL  1 patch Transdermal Q72H   ??? fluconazole in NaCl (iso-osm)  200 mg Intravenous Q24H   ??? insulin regular  0-5 Units Subcutaneous Q6H   ??? piperacillin-tazobactam (ZOSYN) IV extended interval  3.375 g Intravenous Q8H   ??? vancomycin  750 mg Intravenous Q24H     Continuous Infusions:  ??? TPN ADULT ([facility])       PRN Meds:ALPRAZolam, alteplase, dextrose, dextrose 50 % in water (D50W), HYDROmorphone, ipratropium-albuterol, LORazepam, ondansetron    Objective:    Vital signs in last 24 hours:  Temp:  [97.2 ??F (36.2 ??C)-98.4 ??F (36.9 ??C)] 97.2 ??F (36.2 ??C)  Heart Rate:  [70-121] 70  Resp:  [18-22] 22  BP: (98-123)/(62-70) 99/62 mmHg    Physical Examination:     GENERAL:awake, alert, oriented?? ANXIOUS  Cachectic looking  HEENT:Atraumatic.Normocephalic. PERRLA, EOMI. No icterus, no pallor, no jugular venous distention. ??  NECK: Supple. Trachea is in midline. No Thyromegaly. No Lymphadenopathy.  CHEST: Symmetric. Clear to auscultation, bilateral equal air entry. No wheezing or rhonchi.  CARDIOVASCULAR: ns1s2 no mrg rrr  ABDOMEN: ntnd bs+ soft  EXTREMITIES: There is no edema in the lower extremities.?? Pulses are positive in all extremities.Power is 5/5 in all extremities.(+) dry gangrene R foot  ??    Intake/Output last 3 shifts:  I/O last 3 completed shifts:  In: 2511.3 [P.O.:600;  I.V.:468]  Out: 3900 [Urine:3400; Drains:500]    Lab Results   Component Value Date    WBC 14.0* 07/09/2015    RBC 3.61* 07/09/2015    HCT 31.1* 07/09/2015    MCV 86.2 07/09/2015    MCH 28.8 07/09/2015    MCHC 33.4 07/09/2015    RDW 16.7* 07/09/2015    PLT 156 07/09/2015     Lab Results   Component Value Date    GLUCOSE 107* 07/11/2015    BUN 24 07/11/2015    CO2 27 07/11/2015    CREATININE 0.52* 07/11/2015    K 3.6 07/11/2015    NA 142 07/11/2015    CL 108 07/11/2015    CALCIUM 7.6* 07/11/2015         Diet Orders          TPN ADULT ([facility]) at 62.5 mL/hr starting at 04/02 1800    Diet NPO effective now Except for: except ice chips, except sips of clears starting at 03/28 1610          Future Appointments:  No future appointments.    Assessment/Plan:    ????CHRONIC PAIN INCREASE DILAUDID TO 4 MG Q4 PRN AND CONSIDER FENTANYL  INCREASE  ?? 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 Abdominal abscess (07/04/2015)abx vanc / zosyn?? and diflucan, duration depending on clinical / radiographic improvement. wound care????????????????  Prophylaxis;  GI Prophylaxis: Pepcid  ??DVT Prophylaxis: SCDs     Code Status: Full Code  Barb Merino MD.     07/14/2015

## 2015-07-14 NOTE — Unmapped (Signed)
Problem: Daily Care  Goal: Daily care needs are met  Assess and monitor ability to perform self care and identify potential discharge needs.  Outcome: Progressing  Pt needs encouragement to provide/participate in self care

## 2015-07-14 NOTE — Unmapped (Signed)
Problem: Fall Prevention  Goal: Patient will remain free of falls  Assess and monitor vitals signs, neurological status including level of consciousness and orientation. Reassess fall risk per hospital policy.    Ensure arm band on, uncluttered walking paths in room, adequate room lighting, call light and overbed table within reach, bed in low position, wheels locked, side rails up per policy, and non-skid footwear provided.   Intervention: Keep call light within reach  Call light and bedside table within reach

## 2015-07-14 NOTE — Unmapped (Signed)
Pt med every 2 hr for c/o abd pain - s/w effective  tpn and ivpb infusing w/o difficulty

## 2015-07-14 NOTE — Unmapped (Signed)
Clinical Pharmacy Note (TPN Dosing)    No labs to review today.      Reviewed chart for any dietary recommendations and other problems.    No changes to TPN    Jaileen Janelle E Judy Goodenow  07/14/2015  7:54 AM

## 2015-07-14 NOTE — Unmapped (Signed)
Pt's pain management review:  Pt is requiring 2 mg dilaudid every 2 hours to alleviate pain rating 10 to a rating of 8; pt is not getting adequate relief from generalized, visceral pain.  Pt has a duragesic patch as well, w/o relief.  Pt has had a total of 11 doses of dilaudid from 4/1 2400 to 4/2 2400.

## 2015-07-15 LAB — BASIC METABOLIC PANEL
Anion Gap: 5 mmol/L (ref 3–16)
BUN: 37 mg/dL (ref 7–25)
CO2: 30 mmol/L (ref 21–33)
Calcium: 8 mg/dL (ref 8.6–10.3)
Chloride: 102 mmol/L (ref 98–110)
Creatinine: 0.72 mg/dL (ref 0.60–1.30)
Glucose: 102 mg/dL (ref 70–100)
Osmolality, Calculated: 293 mOsm/kg (ref 278–305)
Sodium: 137 mmol/L (ref 133–146)
eGFR AA CKD-EPI: >90 See note.
eGFR NONAA CKD-EPI: >90 See note.

## 2015-07-15 LAB — MAGNESIUM: Magnesium: 2.5 mg/dL (ref 1.5–2.5)

## 2015-07-15 LAB — HEPATIC FUNCTION PANEL
ALT: 38 U/L (ref 7–52)
AST: 93 U/L (ref 13–39)
Albumin: 1.9 g/dL (ref 3.5–5.7)
Alkaline Phosphatase: 126 U/L (ref 36–125)
Bilirubin, Direct: 0.07 mg/dL (ref 0.00–0.40)
Bilirubin, Indirect: 0.73 mg/dL (ref 0.00–1.10)
Total Bilirubin: 0.8 mg/dL (ref 0.0–1.5)
Total Protein: 5.8 g/dL (ref 6.4–8.9)

## 2015-07-15 LAB — POC GLU MONITORING DEVICE
POC Glucose Monitoring Device: 120 mg/dL (ref 70–100)
POC Glucose Monitoring Device: 102 mg/dL (ref 70–100)
POC Glucose Monitoring Device: 153 mg/dL (ref 70–100)

## 2015-07-15 LAB — PHOSPHORUS: Phosphorus: 5.6 mg/dL (ref 2.1–4.7)

## 2015-07-15 MED ORDER — fentaNYL (DURAGESIC) 75 mcg/hr 1 patch
75 | TRANSDERMAL | Status: AC
Start: 2015-07-15 — End: 2015-07-18
  Administered 2015-07-15: 23:00:00 1 via TRANSDERMAL

## 2015-07-15 MED ORDER — TPN ADULT (WCH/Drake)
INTRAVENOUS | Status: AC
Start: 2015-07-15 — End: 2015-07-16
  Administered 2015-07-15: 22:00:00 via INTRAVENOUS

## 2015-07-15 MED FILL — ASPIRIN 81 MG CHEWABLE TABLET: 81 81 MG | ORAL | Qty: 1

## 2015-07-15 MED FILL — HYDROMORPHONE 2 MG/ML INJECTION SYRINGE: 2 2 mg/mL | INTRAMUSCULAR | Qty: 2

## 2015-07-15 MED FILL — FENTANYL 75 MCG/HR TRANSDERMAL PATCH: 75 75 mcg/hr | TRANSDERMAL | Qty: 1

## 2015-07-15 MED FILL — PIPERACILLIN-TAZOBACTAM 3.375 GRAM INTRAVENOUS SOLUTION: 3.375 3.375 gram | INTRAVENOUS | Qty: 1

## 2015-07-15 MED FILL — CLOPIDOGREL 75 MG TABLET: 75 75 mg | ORAL | Qty: 1

## 2015-07-15 MED FILL — FLUCONAZOLE 200 MG/100 ML IN SOD. CHLORIDE (ISO) INTRAVENOUS PIGGYBACK: 200 200 mg/100 mL | INTRAVENOUS | Qty: 100

## 2015-07-15 MED FILL — VANCOMYCIN 1,000 MG INTRAVENOUS INJECTION: 1000 1000 mg | INTRAVENOUS | Qty: 750

## 2015-07-15 MED FILL — TRAVASOL 10 % INTRAVENOUS SOLUTION: 10 10 % | INTRAVENOUS | Qty: 720

## 2015-07-15 NOTE — Unmapped (Signed)
Linda    [name] Ponce   Wound Care Admission Assessment  07/15/2015       Linda Ponce is an 45 y.o. female.  DOB: 03-Nov-1970    Pressure ulcers on admission:  One STAGE: III on coccyx              One UNSTAGEABLE on right lateral hip    Diagnosis: jewish 07/05/15 dx: sepsis    Allergies   Allergen Reactions   ??? Compazine [Prochlorperazine Edisylate]    ??? Latex, Natural Rubber        History:    Past Medical History   Diagnosis Date   ??? Cancer    ??? Abscess of abdominal cavity    ??? Gangrene of foot    ??? AKI (acute kidney injury)    ??? Fistula    ??? Peritonitis and retroperitoneal infections    ??? Cachexia    ??? Hyperkalemia    ??? Dehydration    ??? Encephalopathies    ??? Ischemic foot      Past Surgical History   Procedure Laterality Date   ??? Colon surgery       Social History   Substance Use Topics   ??? Smoking status: Never Smoker    ??? Smokeless tobacco: None   ??? Alcohol Use: None     Present admission history of illness/MD note.  47f with history of colorectal CA, initially admitted to [facility] operatively for continued wound care. Developed bleeding, sent back to [facility] where she was foundto have multiple pelvic abscesses, enterocutaneous fistulas, was kept NPO, started on IV abx, TPN, and returns for continued care.    Skin/Wound Care Assessment.    Braden: 15     Pressure wounds:    Coccyx STAGE:   III    Pt currently refusing wound team to assess this wound, below if assessment and photo from 07-09-15, will attempt to assess wound again this week.        Measures:  L 1.5 cm X W 1 cm X D  0.2 cm.   Wounds progress:   Initial assessment     Wound Assessment:  Appears as shallow Stage 3, open area of moist pink tissue, scant serosanguinous drainage, no odor noted. Edges flat, attached. Periwound skin intact.   Treatment/Dressing: Clean with normal saline, pat dry. Apply Medihoney and cover with Mepilex border. Change every other day and prn dressing failure.      _____________________________________________________________________       Pressure wounds:    Right lateral hip STAGE: Unstagable     Measures:  L 1.5 cm X W 1 cm X D  0.1 cm.   Wounds progress:  improving  Wound Assessment:  Thin well adhered beige slough, scant serosanguinous drainage, no odor noted. Edges flat, attached. Periwound skin intact.  Treatment/Dressing:Clean with normal saline, pat dry. Apply Medihoney and cover with Mepilex border. Change every other day and prn dressing failure.        _____________________________________________________________________    Surgical wounds:????????????????????????????????????????????????????????????    Mid Abdominal Wound with fistula???? Full Thickness. Measures: 8.7 cnm L x 5 cm W x 1.5 cm D, has tunnel at 3 oclock that appears to tunnel over to wound/fistula on left abdomen   Wounds progress:???? Initial assessment, pouch had been in place on admission assessment ??  Wound Assessment:?? Moist pink tissue, some granulation and some non granulation, and appears some areas of mucosal tissue/fistula.  Moderate amount liquid gold effluent draining into  foley bag. No bleeding noted.Periwound skin intact. Pouch has stayed on for several days, but apparently this morning this pouch and other fistula pouch leaking. Patient did help participate in care. Helped to remove pouches.   Treatment/Dressing:  Clean wounds and surrounding skin with soap and water, pat dry. Attach Eakins pouch using Eakins rings to help with adherence. Use large pouch Eakins # [id number] for mid abdomen and smaller pouch [phone] for left abdomen. Change pouches weekly and prn for leakages.      _____________________________________________________________________  Surgical wounds:????????????  Left Lateral Abdomen Fistula???? Full Thickness. Measures: L 5.5?? cm X W 1.5 cm X?? D 1.5 cm,has tunnel at 9 oclock that appears to tunnel over to larger wound/fistula of mid abdomen  ??  Wounds progress:????slight improvement, slightly smaller  measurements  Wound Assessment:?? Full thickness opening, moist pink tissue with moderate amount of gold effluent, edges attached. Periwound skin intact. This pouch had leaked this morning and patient covered with effluent. Nursing staff cleaning patient up, patient very upset. Pouches have stayed intact for several days.   Treatment/Dressing: Mid abdominal and left abdominal fistula/wounds being managed with Eakins pouches attached to gravity drainage bags. Clean wounds and surrounding skin with soap and water, pat dry. Attach Eakins pouch using Eakins rings to help with adherence. Use large pouch Eakins # [id number] for mid abdomen and smaller pouch [phone] for left abdomen. Change pouches weekly and prn for leakages.  ??   _____________________________________________________________________  Surgical wounds:????????????  Drain of left groin, ?? ??  Wounds progress:???? Initial assessment, had pouch in place last week with assessmetn ??  Wound Assessment: left groin tube is open, black suture holding in place, skin intact, had been enclosed in  a small ostomy pouch, small amount of light yellow serous drainage in pouch. Pouch has stayed intact since admission, this morning fistula pouches leaking over everything, so this pouch changed.   Treatment/Dressing: .Drain to left groin is open, apply small Hollister pouch [phone] to this , change  pouch weekly and prn dressing failure.       _____________________________________________________________________  Surgical wounds:????????????  Bilateral nephrostomy drains  Wounds progress:???? UTA??  Wound Assessment: pouches to bilateral tubes appear intact , gold liquid in drainage bags, but patient refuses to roll onto side to have these assessed.   Treatment/Dressing: Bilateral nephrostomy tubes both to drainage pouches. Dressing changes every 3 days and prn for dressing failure. Clean sites with CHG, allow to dry. Apply AMD slit drain gauze to site, cover with Tegaderm. Position tubing so that  patient is not lying on tubing.Drain to left groin is currently capped, apply small Hollister pouch [phone] to this , change  pouch weekly and prn dressing failure.    _____________________________________________________________________  Vascular Wounds on Right Foot????????    Pt currently refusing wound team to assess this wound, below if assessment and photo from 07-09-15, will attempt to assess wound again this week.     Wounds progress:???? Initial assessment???? ??  Wound Assessment:?? Dry black eschar, no open areas. Eschar has not extended beyond marked line. Has positive pedal pulses.   Treatment/Dressing: betadine paint daily, LOTA        _____________________________________________________________________  Surgical wounds:  Colostomy/mucsous fistula ???? RLQ  Wound Progress: initial assessment, had pouch in place with admission assessment last week  Wound Assessment:??Moist pink round raised stoma, 2 cm diameter. Mucocutaneous junction and peiostomal skin intact. Scant mucous drainage in old pouch only.   Wound Treatment: Right lower quadrant colostomy currently only functioning  as mucous fistula. Keep covered with small ostomy pouch Hollister # [id number]. Change weekly and prn dressing failure.         ______________________________________________________________    Treatment time: 2 nurses approximately 2 hours????????????????????????    Preventative measures.    Off loading and pressure relief as measures are using an accumax mattress, Turning Wedge, pillows, and waffle boots to aid in off load of pressure points.  Other measures include lotion's and cream's to Hydrate and moisturize dry skin.  Moisture barrier cream [employer] clear is provided to protect groin and peri anal area from incontinence. Currently has foley catheter with statlock in place.         Labs:   Lab Results   Component Value Date    GLUCOSE 107* 07/11/2015    BUN 24 07/11/2015    CO2 27 07/11/2015    CREATININE 0.52* 07/11/2015    K 3.6 07/11/2015    NA 142  07/11/2015    CL 108 07/11/2015    CALCIUM 7.6* 07/11/2015     Lab Results   Component Value Date    WBC 14.0* 07/09/2015    HGB 10.4* 07/09/2015    HCT 31.1* 07/09/2015    MCV 86.2 07/09/2015    PLT 156 07/09/2015     No results found for: PTT, INR  Lab Results   Component Value Date    PREALBUMIN 16.0* 07/09/2015     Lab Results   Component Value Date    ALBUMIN 1.5* 07/09/2015     No results found for: ESR, CRP

## 2015-07-15 NOTE — Unmapped (Signed)
Clinical Pharmacy Note - TPN Order    Linda Ponce is a 45 y.o. female that is being followed by the clinical pharmacy department for monitoring and adjustment of TPN.    Lab 07/11/15  0528 07/09/15  0544   SODIUM 142 139   CHLORIDE 108 109   CO2 27 22   POTASSIUM 3.6 4.0   PHOSPHORUS 2.6 4.0   MAGNESIUM 1.8 1.6   CALCIUM 7.6* 7.6*   GLUCOSE 107* 69*   PREALBUMIN  --  16.0*   ALBUMIN  --  1.5*   Corrected Ca: no new labs at this time  TG Rush Farmer):     Diet Orders          TPN ADULT ([facility]) at 62.5 mL/hr starting at 04/02 1800    Diet NPO effective now Except for: except ice chips, except sips of clears starting at 03/28 9604        Macronutrient TPN orders: per Ernestina Penna, RD (3/28)   Volume 1500 ml   Rate 62.5 ml/hr   Amino Acids: 72 grams/day ( 48 g/L)   Dextrose: 287.7 grams/day (192 g/L)   Lipids: 15 grams/day  (10 g/L)     TPN Additives:   MVI   Trace elements  Ascorbic acid    Assessment/Plan   1) lab unable to collect sample this morning. Called and spoke with Morley Kos, charge nurse and asked her to collect a lab via central line. Per lab, they received a hemolyzed sample and they are unable to send specimen [facility] and they requested a second sample. Lab will not return prior to TPN deadline today.   2) Continue TPN as previously ordered and add ascorbic acid due to wounds and fistula.   3) repeat labs tomorrow.   4) Continue current TPN order with noted changes above and pharmacy will monitor labs and adjust TPN as needed.     Thanks for the consult,   Domenick Bookbinder, PharmD  Pharmacist-[facility]  07/15/2015 7:43 AM

## 2015-07-15 NOTE — Unmapped (Signed)
Assessment completed call light within reach continues atb tpn rerfusing meds also care to fistulas dr family notified

## 2015-07-15 NOTE — Unmapped (Signed)
Problem: Potential for Compromised Skin Integrity  Goal: Skin integrity is maintained or improved  Assess and monitor skin integrity. Identify patients at risk for skin breakdown on admission and per policy. Collaborate with interdisciplinary team and initiate plans and interventions as needed.   Outcome: Progressing  Avoid shearing, keep skin clean and dry, turn pt, relieve pressure to bony prominences,encourage use of moisturizer on skin, and alternate full baths with partial baths.

## 2015-07-15 NOTE — Unmapped (Signed)
Pt med every 4 hr for c/o pain - s/w effective  Pt anxious and restless  Pt resistant to repositioning but c/o discomfort  ivpb infused w/o difficulty  Pt confused at times

## 2015-07-15 NOTE — Unmapped (Signed)
Problem: Fall Prevention  Goal: Patient will remain free of falls  Assess and monitor vitals signs, neurological status including level of consciousness and orientation. Reassess fall risk per hospital policy.    Ensure arm band on, uncluttered walking paths in room, adequate room lighting, call light and overbed table within reach, bed in low position, wheels locked, side rails up per policy, and non-skid footwear provided.   Intervention: Keep call light within reach  Call light and bedside table within reach

## 2015-07-15 NOTE — Unmapped (Signed)
Hospitalist Progress Note    07/15/2015     6:29 PM    Linda Ponce   LOS: 7 days       HPI;    Principal Problem:    Fistula  Active Problems:    Abdominal abscess    Enterocutaneous fistula    Malnutrition      Subjective & Interval History;    Anxious per staff, requires continuous prn pan Rx for abdominal pain       Review of Systems:  As above otherwise negative         PMH: Unchanged from H&P.   PSH: Unchanged from H&P.   Family medical history: Unchanged from H&P.   Social history: Unchanged from H&P      Scheduled Meds:  ??? aspirin  81 mg Per NG tube Daily with breakfast   ??? clopidogrel  75 mg Per NG tube Daily 0900   ??? fentaNYL  1 patch Transdermal Q72H   ??? fluconazole in NaCl (iso-osm)  200 mg Intravenous Q24H   ??? insulin regular  0-5 Units Subcutaneous Q6H   ??? piperacillin-tazobactam (ZOSYN) IV extended interval  3.375 g Intravenous Q8H   ??? vancomycin  750 mg Intravenous Q24H     Continuous Infusions:  ??? TPN ADULT ([facility]) 62.5 mL/hr at 07/15/15 1806     PRN Meds:ALPRAZolam, alteplase, dextrose, dextrose 50 % in water (D50W), HYDROmorphone, ipratropium-albuterol, LORazepam, ondansetron    Objective:    Vital signs in last 24 hours:  Temp:  [97.9 ??F (36.6 ??C)] 97.9 ??F (36.6 ??C)  Heart Rate:  [125] 125  Resp:  [20] 20  BP: (102)/(78) 102/78 mmHg    Physical Examination:     GENERAL: The patient is awake, alert, oriented?? not in acute cardiorespiratory distress. ??  HEENT:Atraumatic.Normocephalic. PERRLA, EOMI. No icterus, no pallor, no jugular venous distention. ??  NECK: Supple. Trachea is in midline. No Thyromegaly. No Lymphadenopathy.  CHEST: Symmetric. Clear to auscultation, bilateral equal air entry. No wheezing or rhonchi.  CARDIOVASCULAR: S1, S2, Regular pulse. No murmur or gallops. No JVD.  ABDOMEN: Soft, nontender, nondistended, positive bowel sounds.No viscera palpable.  EXTREMITIES: There is no edema in the lower extremities.?? Pulses are positive in all extremities.Power is 5/5 in all  extremities.(+) dry gangrene R foot  ??    Intake/Output last 3 shifts:  I/O last 3 completed shifts:  In: 4136 [P.O.:960; I.V.:1083]  Out: 4475 [Urine:3825; Drains:50; Stool:600]    Recent Labs;  @SLAB @  No results found for: INR, PROTIME    ABG;  No results found for: PHART, PCO2, PO2ART, HCO3ART, BEART, HBO2PER, O2SATART        Last  Culture and Gram Stain;  No results found for: AEROBOT, ANABOT, LABGRAM, ISO2, ISO3, ISO4, ISO5, ISO6, PNAFISH      Diet;    Diet Orders          TPN ADULT ([facility]) at 62.5 mL/hr starting at 04/03 1800    Diet NPO effective now Except for: except ice chips, except sips of clears starting at 03/28 1914          Future Appointments:  No future appointments.    Assessment/Plan:      Colon CA   - will discuss with sister, s/p radiation , surgery but no records avail      ?? Abdominal abscess (07/04/2015)  ????????????????????????- continue abx vanc / zosyn?? and diflucan, duration depending on clinical / radiographic improvement.   - wound care        ????????????????????????  ??  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   Pager# [phone]    07/15/2015

## 2015-07-15 NOTE — Unmapped (Signed)
Patient alert and oriented x4 upon assessment this evening.  Speech is clear, and patient in pain and tearful.  No c/o cough or sore throat, and lung sounds are clear. Vitals are stable.  Patient has c/o  pain and  Discomfort with prn pain medication administration which was effective, and no s/s of distress are evident at this time.  Bed mobility and ADL care are performed with staff assistance but pt refuses a lot of patient care. Patient takes IV medications well.  Over bed table and call light is within reach.  Side rails are up x2.  Patient with non skid footwear on. Drains and fistula pouches are in tack without any leakage or complications at this time.

## 2015-07-15 NOTE — Unmapped (Signed)
Problem: Knowledge Deficit  Goal: Patient/family/caregiver demonstrates understanding of disease process, treatment plan, medications, and discharge instructions  Complete learning assessment and assess knowledge base.   Educate to wounds

## 2015-07-16 ENCOUNTER — Encounter: Attending: Surgery | Primary: Internal Medicine

## 2015-07-16 LAB — CBC
Hematocrit: 33 % (ref 35.0–45.0)
Hemoglobin: 10.9 g/dL (ref 11.7–15.5)
MCH: 28.9 pg (ref 27.0–33.0)
MCHC: 33 g/dL (ref 32.0–36.0)
MCV: 87.4 fL (ref 80.0–100.0)
MPV: 7.4 fL (ref 7.5–11.5)
Platelets: 503 10*3/uL (ref 140–400)
RBC: 3.78 10*6/uL (ref 3.80–5.10)
RDW: 17.1 % (ref 11.0–15.0)
WBC: 12.3 10*3/uL (ref 3.8–10.8)

## 2015-07-16 LAB — RENAL FUNCTION PANEL W/EGFR
Albumin: 1.6 g/dL (ref 3.5–5.7)
Anion Gap: 7 mmol/L (ref 3–16)
BUN: 41 mg/dL (ref 7–25)
CO2: 30 mmol/L (ref 21–33)
Calcium: 8.1 mg/dL (ref 8.6–10.3)
Chloride: 103 mmol/L (ref 98–110)
Creatinine: 0.7 mg/dL (ref 0.60–1.30)
Glucose: 79 mg/dL (ref 70–100)
Osmolality, Calculated: 299 mOsm/kg (ref 278–305)
Phosphorus: 4.8 mg/dL (ref 2.1–4.7)
Potassium: 4.8 mmol/L (ref 3.5–5.3)
Sodium: 140 mmol/L (ref 133–146)
eGFR AA CKD-EPI: >90 See note.
eGFR NONAA CKD-EPI: >90 See note.

## 2015-07-16 LAB — DIFFERENTIAL
Basophils Absolute: 25 /uL (ref 0–200)
Basophils Relative: 0.2 % (ref 0.0–1.0)
Eosinophils Absolute: 86 /uL (ref 15–500)
Eosinophils Relative: 0.7 % (ref 0.0–8.0)
Lymphocytes Absolute: 4723 /uL (ref 850–3900)
Lymphocytes Relative: 38.4 % (ref 15.0–45.0)
Monocytes Absolute: 2485 /uL (ref 200–950)
Monocytes Relative: 20.2 % (ref 0.0–12.0)
Neutrophils Absolute: 4982 /uL (ref 1500–7800)
Neutrophils Relative: 40.5 % (ref 40.0–80.0)
nRBC: 0 /100 WBC (ref 0–0)

## 2015-07-16 LAB — POC GLU MONITORING DEVICE
POC Glucose Monitoring Device: 136 mg/dL (ref 70–100)
POC Glucose Monitoring Device: 125 mg/dL (ref 70–100)
POC Glucose Monitoring Device: 101 mg/dL (ref 70–100)
POC Glucose Monitoring Device: 137 mg/dL (ref 70–100)

## 2015-07-16 LAB — TRIGLYCERIDES: Triglycerides: 126 mg/dL (ref 10–149)

## 2015-07-16 LAB — MAGNESIUM: Magnesium: 2.1 mg/dL (ref 1.5–2.5)

## 2015-07-16 MED ORDER — TPN ADULT (WCH/Drake)
INTRAVENOUS | Status: AC
Start: 2015-07-16 — End: 2015-07-17
  Administered 2015-07-16: 21:00:00 via INTRAVENOUS

## 2015-07-16 MED FILL — HYDROMORPHONE 2 MG/ML INJECTION SYRINGE: 2 2 mg/mL | INTRAMUSCULAR | Qty: 2

## 2015-07-16 MED FILL — VANCOMYCIN 1,000 MG INTRAVENOUS INJECTION: 1000 1000 mg | INTRAVENOUS | Qty: 750

## 2015-07-16 MED FILL — ONDANSETRON HCL (PF) 4 MG/2 ML INJECTION SOLUTION: 4 4 mg/2 mL | INTRAMUSCULAR | Qty: 2

## 2015-07-16 MED FILL — PIPERACILLIN-TAZOBACTAM 3.375 GRAM INTRAVENOUS SOLUTION: 3.375 3.375 gram | INTRAVENOUS | Qty: 1

## 2015-07-16 MED FILL — TRAVASOL 10 % INTRAVENOUS SOLUTION: 10 10 % | INTRAVENOUS | Qty: 720

## 2015-07-16 MED FILL — FLUCONAZOLE 200 MG/100 ML IN SOD. CHLORIDE (ISO) INTRAVENOUS PIGGYBACK: 200 200 mg/100 mL | INTRAVENOUS | Qty: 100

## 2015-07-16 NOTE — Unmapped (Signed)
[name]   Wound Care Assessment  07/16/2015       Linda Ponce is an 45 y.o. female.  DOB: 16-Feb-1971    Pressure ulcers on admission:  One STAGE: III on coccyx              One UNSTAGEABLE on right lateral hip    Diagnosis: jewish 07/05/15 dx: sepsis    Allergies   Allergen Reactions   ??? Compazine [Prochlorperazine Edisylate]    ??? Latex, Natural Rubber        History:    Past Medical History   Diagnosis Date   ??? Cancer    ??? Abscess of abdominal cavity    ??? Gangrene of foot    ??? AKI (acute kidney injury)    ??? Fistula    ??? Peritonitis and retroperitoneal infections    ??? Cachexia    ??? Hyperkalemia    ??? Dehydration    ??? Encephalopathies    ??? Ischemic foot      Past Surgical History   Procedure Laterality Date   ??? Colon surgery       Social History   Substance Use Topics   ??? Smoking status: Never Smoker    ??? Smokeless tobacco: None   ??? Alcohol Use: None     Present admission history of illness/MD note.  89f with history of colorectal CA, initially admitted to [facility] post operatively for continued wound care. Developed bleeding, sent back to [facility] where she was foundto have multiple pelvic abscesses, enterocutaneous fistulas, was kept NPO, started on IV abx, TPN, and returns for continued care.    Skin/Wound Care Assessment.    WCT requested by patient advocate to see patient. Left abdomen pouch leaking, apparently has been leaking for some time and she has been refusing nurse and PCA to get her cleaned up.  Care done below as follows, also assessing Right foot and coccyx wound which patient had refused to have WCT assess yesterday.     Braden: 14     Pressure wounds:    Coccyx STAGE:   III     Measures:  L 1.5 cm X W 1 cm X D  0.2 cm.   Wounds progress:   No significant change    Wound Assessment:  Appears as shallow Stage 3, open area of moist pink tissue, scant serosanguinous drainage, no odor noted. Edges flat, attached. Periwound skin intact.   Treatment/Dressing: Clean with normal saline, pat dry.  Apply Medihoney and cover with Mepilex border. Change every other day and prn dressing failure.       _____________________________________________________________________    Dressing intact to this wound, assessment from yesterday:     Pressure wounds:    Right lateral hip STAGE: Unstagable     Measures:  L 1.5 cm X W 1 cm X D  0.1 cm.   Wounds progress:  improving  Wound Assessment:  Thin well adhered beige slough, scant serosanguinous drainage, no odor noted. Edges flat, attached. Periwound skin intact.  Treatment/Dressing:Clean with normal saline, pat dry. Apply Medihoney and cover with Mepilex border. Change every other day and prn dressing failure.        _____________________________________________________________________    This pouch with small leak at lower right edge. Skin dry and intact and wafer of pouch not soiled. Patched with small piece of Eakins seal, pouch intact. assessment of wound from yesterday:    Surgical wounds:????????????????????????????????????????????????????????????    Mid Abdominal  Wound with fistula???? Full Thickness. Measures: 8.7 cnm L x 5 cm W x 1.5 cm D, has tunnel at 3 oclock that appears to tunnel over to wound/fistula on left abdomen   Wounds progress:???? Initial assessment, pouch had been in place on admission assessment ??  Wound Assessment:?? Moist pink tissue, some granulation and some non granulation, and appears some areas of mucosal tissue/fistula.  Moderate amount liquid gold effluent draining into foley bag. No bleeding noted.Periwound skin intact. Pouch has stayed on for several days, but apparently this morning this pouch and other fistula pouch leaking. Patient did help participate in care. Helped to remove pouches.   Treatment/Dressing:  Clean wounds and surrounding skin with soap and water, pat dry. Attach Eakins pouch using Eakins rings to help with adherence. Use large pouch Eakins # [id number] for mid abdomen and smaller pouch [phone] for left abdomen. Change pouches weekly and prn for  leakages.      _____________________________________________________________________    This pouch was leaking large amounts brown liquid effluent, patient had been laying in puddle for some time. Care done similar to yesterday when this wound assessed:      Surgical wounds:????????????  Left Lateral Abdomen Fistula???? Full Thickness. Measures: L 5.5?? cm X W 1.5 cm X?? D 1.5 cm,has tunnel at 9 oclock that appears to tunnel over to larger wound/fistula of mid abdomen  ??  Wounds progress:????slight improvement, slightly smaller measurements  Wound Assessment:?? Full thickness opening, moist pink tissue with moderate amount of gold effluent, edges attached. Periwound skin intact. This pouch had leaked this morning and patient covered with effluent. Nursing staff cleaning patient up, patient very upset. Pouches have stayed intact for several days.   Treatment/Dressing: Mid abdominal and left abdominal fistula/wounds being managed with Eakins pouches attached to gravity drainage bags. Clean wounds and surrounding skin with soap and water, pat dry. Attach Eakins pouch using Eakins rings to help with adherence. Use large pouch Eakins # [id number] for mid abdomen and smaller pouch [phone] for left abdomen. Change pouches weekly and prn for leakages.  ??   _____________________________________________________________________    This pouch is intact, assessment done yesterday:    Surgical wounds:????????????  Drain of left groin, ?? ??  Wounds progress:???? Initial assessment, had pouch in place last week with assessmetn ??  Wound Assessment: left groin tube is open, black suture holding in place, skin intact, had been enclosed in  a small ostomy pouch, small amount of light yellow serous drainage in pouch. Pouch has stayed intact since admission, this morning fistula pouches leaking over everything, so this pouch changed.   Treatment/Dressing: .Drain to left groin is open, apply small Hollister pouch [phone] to this , change  pouch weekly and prn  dressing failure.       _____________________________________________________________________  Surgical wounds:????????????  Bilateral nephrostomy drains  Wounds progress:???? no significant chagne  Wound Assessment: bilateral tubes to closed system drainage bag , gold liquid in drainage bags, both sites unremarkable, both dressings soiled and coming off due to large amount of leakage from fistula pouch, dressing changes done as follows:but patient refuses to roll onto side to have these assessed.   Treatment/Dressing: Bilateral nephrostomy tubes both to drainage pouches. Dressing changes every 3 days and prn for dressing failure. Clean sites with CHG, allow to dry. Apply AMD slit drain gauze to site, cover with Tegaderm. Position tubing so that patient is not lying on tubing.Drain to left groin is currently capped, apply small Hollister pouch [phone] to this ,  change  pouch weekly and prn dressing failure.    Left nephrostomy drain, photo not done of right drain, appears similiar  _____________________________________________________________________  Vascular Wounds on Right Foot????????    Wounds progress:????no significant change ??  Wound Assessment:?? Dry black eschar, no open areas. Eschar has not extended beyond marked line. Has positive pedal pulses.   Treatment/Dressing: betadine paint daily, LOTA      _____________________________________________________________________    Surgical wounds:    Pouch changed due to was loosened from drainage from leaking fistula pouch, assessment from yesterday:    Colostomy/mucsous fistula ???? RLQ  Wound Progress: initial assessment, had pouch in place with admission assessment last week  Wound Assessment:??Moist pink round raised stoma, 2 cm diameter. Mucocutaneous junction and peiostomal skin intact. Scant mucous drainage in old pouch only.   Wound Treatment: Right lower quadrant colostomy currently only functioning as mucous fistula. Keep covered with small ostomy pouch Hollister # [id number].  Change weekly and prn dressing failure.         ______________________________________________________________    Treatment time: 2 nurses approximately 1 hour ????????????????????????    Preventative measures.    Off loading and pressure relief as measures are using an accumax mattress, Turning Wedge, pillows, and waffle boots to aid in off load of pressure points.  Other measures include lotion's and cream's to Hydrate and moisturize dry skin.  Moisture barrier cream Crit[employer] clear is provided to protect groin and peri anal area from incontinence. Currently has foley catheter with statlock in place.         Labs:   Lab Results   Component Value Date    GLUCOSE 79 07/16/2015    BUN 41* 07/16/2015    CO2 30 07/16/2015    CREATININE 0.70 07/16/2015    K 4.8 07/16/2015    NA 140 07/16/2015    CL 103 07/16/2015    CALCIUM 8.1* 07/16/2015     Lab Results   Component Value Date    WBC 12.3* 07/16/2015    HGB 10.9* 07/16/2015    HCT 33.0* 07/16/2015    MCV 87.4 07/16/2015    PLT 503* 07/16/2015     No results found for: PTT, INR  Lab Results   Component Value Date    PREALBUMIN 16.0* 07/09/2015     Lab Results   Component Value Date    ALBUMIN 1.6* 07/16/2015     No results found for: ESR, CRP

## 2015-07-16 NOTE — Unmapped (Signed)
Problem: Knowledge Deficit  Goal: Patient/family/caregiver demonstrates understanding of disease process, treatment plan, medications, and discharge instructions  Complete learning assessment and assess knowledge base.   Outcome: Progressing  Explained to pt purpose of medications. Pt understands

## 2015-07-16 NOTE — Unmapped (Signed)
AOx4. On RA. complained of generalized pain. PRN pain medicine given. Fair results noted. Dressing changes done in the morning. Repositioned often. Refused PO medicine. Swallow eval ordered. Vitals WDL. Family/friends at bedside in the vening. No s/s of distress during shift. Anxious at times. Will continue to monitor.  Blood pressure 120/92, pulse 134, temperature 97.5 ??F (36.4 ??C), temperature source Oral, resp. rate 20, height 5' 2.5 (1.588 m), weight 100 lb 4.8 oz (45.496 kg), SpO2 100 %.  [name] Delaine Hernandez

## 2015-07-16 NOTE — Unmapped (Signed)
Hospitalist Progress Note    07/16/2015     12:33 PM    Lauraine Rymer   LOS: 8 days       HPI;    Principal Problem:    Fistula  Active Problems:    Abdominal abscess    Enterocutaneous fistula    Malnutrition      Subjective & Interval History;    Afebrile,  requires continuous prn pan Rx for abdominal pain       Review of Systems:  As above otherwise negative         PMH: Unchanged from H&P.   PSH: Unchanged from H&P.   Family medical history: Unchanged from H&P.   Social history: Unchanged from H&P      Scheduled Meds:  ??? aspirin  81 mg Per NG tube Daily with breakfast   ??? clopidogrel  75 mg Per NG tube Daily 0900   ??? fentaNYL  1 patch Transdermal Q72H   ??? fluconazole in NaCl (iso-osm)  200 mg Intravenous Q24H   ??? insulin regular  0-5 Units Subcutaneous Q6H   ??? piperacillin-tazobactam (ZOSYN) IV extended interval  3.375 g Intravenous Q8H   ??? vancomycin  750 mg Intravenous Q24H     Continuous Infusions:  ??? TPN ADULT ([facility]) 62.5 mL/hr at 07/15/15 1806   ??? TPN ADULT ([facility])       PRN Meds:ALPRAZolam, alteplase, dextrose, dextrose 50 % in water (D50W), HYDROmorphone, ipratropium-albuterol, LORazepam, ondansetron    Objective:    Vital signs in last 24 hours:  Temp:  [97.5 ??F (36.4 ??C)-98.2 ??F (36.8 ??C)] 98 ??F (36.7 ??C)  Heart Rate:  [98-131] 131  Resp:  [18-20] 18  BP: (114-126)/(75-95) 114/75 mmHg    Physical Examination:     GENERAL: The patient is awake, alert, oriented?? not in acute cardiorespiratory distress. ??  HEENT:Atraumatic.Normocephalic. PERRLA, EOMI. No icterus, no pallor, no jugular venous distention. ??  NECK: Supple. Trachea is in midline. No Thyromegaly. No Lymphadenopathy.  CHEST: Symmetric. Clear to auscultation, bilateral equal air entry. No wheezing or rhonchi.  CARDIOVASCULAR: S1, S2, Regular pulse. No murmur or gallops. No JVD.  ABDOMEN: Soft, nontender, nondistended, positive bowel sounds.No viscera palpable.  EXTREMITIES: There is no edema in the lower extremities.?? Pulses are positive  in all extremities.Power is 5/5 in all extremities.(+) dry gangrene R foot  ??    Intake/Output last 3 shifts:  I/O last 3 completed shifts:  In: 5478 [P.O.:840; I.V.:1275; IV Piggyback:1200]  Out: 2125 [Urine:1850; Drains:200; Stool:75]    Recent Labs;  @SLAB @  No results found for: INR, PROTIME    ABG;  No results found for: PHART, PCO2, PO2ART, HCO3ART, BEART, HBO2PER, O2SATART        Last  Culture and Gram Stain;  No results found for: AEROBOT, ANABOT, LABGRAM, ISO2, ISO3, ISO4, ISO5, ISO6, PNAFISH      Diet;    Diet Orders          TPN ADULT ([facility]) at 62.5 mL/hr starting at 04/04 1800    TPN ADULT ([facility]) at 62.5 mL/hr starting at 04/03 1800    Diet NPO effective now Except for: except ice chips, except sips of clears starting at 03/28 1610          Future Appointments:  No future appointments.    Assessment/Plan:      Colon CA   - will discuss with sister, s/p radiation , surgery but no records avail      ?? Abdominal abscess (07/04/2015)  ????????????????????????-  continue abx vanc / zosyn?? and diflucan, duration depending on clinical / radiographic improvement.   - wound care   - increased fentanyl patch, still requiring prn while awake, will wait to increase further      ????????????????????????  ?? 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   Pager# [phone]    07/16/2015

## 2015-07-16 NOTE — Unmapped (Signed)
Clinical Pharmacy Service - TPN Progress Note    Linda Ponce is a 45 y.o. female followed by the pharmacy department for monitoring and adjustment of TPN    Assessment/Plan:   1) Continue continuous TPN with electrolytes, macronutrients and additives from yesterday  2) Electrolytes listed in chart below:  3) CO2=30; Decrease Acetate in bag, Sodium Acetate decreased by  4) K+=4.8, Phos=4.8; removed KPhos from bag, decreased KCl to  5) Corrected Calcium ~9.9; remove Calcium Gluconate from bag  6) No changes to macros  7) Pharmacy will continue to monitor electrolytes and make necessary adjustments.      TPN Electrolytes   Sodium Chloride: 60 mEq   Sodium Acetate: 30 mEq   Sodium Phosphate: 0 mmol   Magnesium Sulfate: 16 mEq   Calcium Gluconate: 0 mEq   Potassium Chloride: 15 mEq   Potassium Acetate: 0 mEq   Potassium Phosphate: 0 mmol TPN Macronutrients   Volume: 1500 mL   Rate: 62.5 mL/hr   Amino Acids: 48 g/L    Dextrose: 192 g/L    Lipids: 10 g/L  TPN Additives   MVI 10 mL   TE 1 mL   Ascorbic Acid 200mg      Subjective/Objective Information:    Recent Labs      07/15/15   0839  07/15/15   0840  07/16/15   0540   NA  137   --   140   CL  102   --   103   CO2  30   --   30   BUN  37*   --   41*   CREATININE  0.72   --   0.70   K  SEE COMMENT   --   4.8   PHOS   --   5.6*  4.8*   MG   --   2.5  2.1   CALCIUM  8.0*   --   8.1*   ALBUMIN   --   1.9*  1.6*   GLUCOSE  102*   --   79   AST   --   93*   --    ALT   --   38   --    ALKPHOS   --   126*   --      Recent Labs      07/15/15   0511  07/15/15   1707  07/15/15   2303  07/16/15   0632   POCGMD  102*  153*  136*  125*     Corrected Calcium = (0.8*(4mg /dL - Albumin))+Serum Ca     Thank you for the consult,  [place] D PHELPS, PHARMD   07/16/2015 7:55 AM

## 2015-07-16 NOTE — Unmapped (Signed)
Pt refused care from PCA throughout the night.

## 2015-07-17 ENCOUNTER — Inpatient Hospital Stay: Admit: 2015-07-17 | Attending: Podiatrist | Primary: Internal Medicine

## 2015-07-17 LAB — POC GLU MONITORING DEVICE
POC Glucose Monitoring Device: 105 mg/dL (ref 70–100)
POC Glucose Monitoring Device: 104 mg/dL (ref 70–100)
POC Glucose Monitoring Device: 110 mg/dL (ref 70–100)
POC Glucose Monitoring Device: 121 mg/dL (ref 70–100)

## 2015-07-17 MED ORDER — TPN ADULT (WCH/Drake)
500 | INTRAMUSCULAR | Status: AC
Start: 2015-07-17 — End: 2015-07-18
  Administered 2015-07-17: 22:00:00 via INTRAVENOUS

## 2015-07-17 MED FILL — FLUCONAZOLE 200 MG/100 ML IN SOD. CHLORIDE (ISO) INTRAVENOUS PIGGYBACK: 200 200 mg/100 mL | INTRAVENOUS | Qty: 100

## 2015-07-17 MED FILL — PIPERACILLIN-TAZOBACTAM 3.375 GRAM INTRAVENOUS SOLUTION: 3.375 3.375 gram | INTRAVENOUS | Qty: 1

## 2015-07-17 MED FILL — HYDROMORPHONE 2 MG/ML INJECTION SYRINGE: 2 2 mg/mL | INTRAMUSCULAR | Qty: 2

## 2015-07-17 MED FILL — VANCOMYCIN 1,000 MG INTRAVENOUS INJECTION: 1000 1000 mg | INTRAVENOUS | Qty: 750

## 2015-07-17 MED FILL — TRAVASOL 10 % INTRAVENOUS SOLUTION: 10 10 % | INTRAVENOUS | Qty: 720

## 2015-07-17 NOTE — Unmapped (Signed)
Pt alert and oriented, vital signs stable complaining of constant pain, unable to sleep, refused Xanax or Ativan as ordered for anxiety, pt states she doesn't like anything that put her to sleep. 4mg  Dilaudid given as ordered PRN q4h for pain. Mild relief noted.

## 2015-07-17 NOTE — Unmapped (Signed)
Problem: Daily Care  Goal: Daily care needs are met  Assess and monitor ability to perform self care and identify potential discharge needs.   Outcome: Progressing  .  Intervention: Assess skin integrity/risk for skin breakdown and implement skin integrity plan of care and interventions per policy  .  Intervention: Assist with ADLs as needed  .  Intervention: Encourage independent activity per ability  .

## 2015-07-17 NOTE — Unmapped (Signed)
Pt AOx4. Confused at times. Complained of generalized pain. PRN pain medicine given, positive results noted. Repositioned q2hrs. Sister at bedside. Refused PO pills. Educated on need to take meds. Vitals WDL. Bed in lowest position. Call light within reach.   Blood pressure 119/83, pulse 136, temperature 98.4 ??F (36.9 ??C), temperature source Oral, resp. rate 20, height 5' 2.5 (1.588 m), weight 100 lb 4.8 oz (45.496 kg), SpO2 100 %.  [name] Brion Sossamon

## 2015-07-17 NOTE — Unmapped (Addendum)
Physical Therapy- Re-evaluation  Physical Therapy Initial Assessment     Name: Linda Ponce  DOB: 12-20-1970  Attending Physician: Corrinne Eagle, MD  Admission Diagnosis: jewish 07/05/15 dx: sepsis  Date: 07/17/2015    Reviewed Pertinent hospital course: Yes  Hospital Course PT/OT: 14f with history of colorectal CA, initially admitted to [facility] post operatively for continued wound care. Developed bleeding, sent back to [facility] where she was foundto have multiple pelvic abscesses, enterocutaneous fistulas, was kept NPO, started on IV abx, TPN, and returns for continued care. PRECAUTIONS: FULL CODE, AAT, NO WEIGHT BEARING RESTRICTIONS, ISCHEMIC R TOES, MULTIPLE DRAINS FROM ABDOMEN AND BACK   Assessment     Assessment: Impaired Bed Mobility, Impaired Transfer Mobility, Impaired Ambulation, Impaired Balance, Deconditioning, Impaired Strength, Impaired ROM  Prognosis: Guarded (due to multiple medical issues )     Pt re-evaluated due to her willingness to participate at this time and not refusing care despite pain. Pt states, I have to get better to be with my son. Pt has 35 y.o. Son in [place] who is coming to visit in next week or so. Pt demonstrates increased motivation to participate and improve functional mobility.   Goals  Pt Will Go Supine To Sit: Minimal (to get in and out of bed within 4 weeks. )  Sit To Stand: Min A with LRAD to prepare for gait and transfers within 6 weeks.   Pt Will Transfer Bed/Chair: Minimal (with LRAD to increase independence in home within 6 weeks. )  Pt Will Ambulate: Minimal (25' with RW to access home within 6 weeks. )  Miscellaneous Goal #1: Pt will propel w/c 150' with B UEs with supervision to increase independence in home and community within 6 weeks.   Long Term Goal : Pt will be independent with HEP to improve B LE strength to facilitate safe mobility within 8 weeks.   Time frame for goals to be met in: 8 weeks, 09/14/15   Recommendation  Plan  Treatment/Interventions: LE strengthening/ROM,  Endurance training, Patient/family training, Equipment eval/education, Museum/gallery curator, Therapeutic Exercise, Therapeutic Activity, Continued evaluation, Wheelchair Mobility, Compensatory technique education, Neuromuscular Reeducation, Gait training  PT Frequency:  (2-4x/wk )    Recommendation  Recommendation: Short-term skilled PT  Equipment Recommended: Defer at this time  Problem List  Patient Active Problem List   Diagnosis   ??? Abdominal abscess   ??? Enterocutaneous fistula   ??? Malnutrition   ??? Fistula      Past Medical History  Past Medical History   Diagnosis Date   ??? Cancer    ??? Abscess of abdominal cavity    ??? Gangrene of foot    ??? AKI (acute kidney injury)    ??? Fistula    ??? Peritonitis and retroperitoneal infections    ??? Cachexia    ??? Hyperkalemia    ??? Dehydration    ??? Encephalopathies    ??? Ischemic foot       Past Surgical History  Past Surgical History   Procedure Laterality Date   ??? Colon surgery       Patient Stated Goals  Goal #1: To get better    Home Living/Prior Function  Type of Home:  (Pt moved from [place] to [place] to live with her sister but unsure of home s/u due to hospitalization since arrival.)  Level of Independence: Independent  Lives With: Family  Receives Help From: Family  ADL Assistance: Needs assistance  Homemaking/IADL Assistance: Needs assistance  Vocational: Unemployed  Pain  Pain Score:   9  Pain Location: Generalized  Pain Descriptors: Aching  Pain Intervention(s): Rest  Therapist reported pain to:: Nsg made aware at EOS and pt not due for pain meds    Vision  Current Vision: No visual deficits    Cognition  Overall Cognitive Status: Within Functional Limits  Orientation Level: Oriented X4    Sensation  Light Touch: Partial deficits in the RLE (Pt with ischemic R toes and forefoot; pt hypersensitive to B LEs to any touch)  Sharp/Dull: No apparent deficits  Additional Comments: Pt c/o numbness and tingling in B feet   Proprioception: No apparent deficits  Inattention/Neglect:  Appears intact  Initiation: Appears intact  Motor Planning: Appears intact  Perseveration: Not present    Upper Extremity  RUE Assessment: Within Functional Limits     LUE Assessment: Within Functional Limits     Lower Extremity  RLE Assessment  RLE Assessment:  (Pt has limited AROM in R knee and hip but WFL PROM )  LLE Assessment  LLE Assessment: Within Functional Limits (at least 2-/5 throughout )  Functional Mobility  Bed Mobility Eval  Rolling: Max assist to left;Max assist to right (holding onto therapist's arms with HOB elevated )  Supine to Sit: Unable to assess (Comment) (Pt declined sitting EOB due to fear and anxiety regarding multiple bags leaking despite max encouragement)  Balance Eval  Sitting - Static:  (NT due to pt declined sitting EOB )  Pt requested to start slow and with sitting up in bed. Pt performed rolling to B sides to straighten out pads under her and then scooted up in bed max A x 2. Cotx with OT performed due to pt's decreased activity tolerance, anxiety and pain with movement.    Patient Education  Role of PT, goals, POC, importance of OOB and sitting   Time  Start Time: 0932  Stop Time: 1008  Time Calculation (min): 36 min    Charges   $PT Re-evaluation Est Plan Care: 1 Procedure      Informed consent of treatment was given  today by patient    Pt with needs in reach post session- demonstrates understanding of plan of care and goals for therapy    Pt may be seen by another PT or PTA  during their stay    Transfer status placed on patient's white board with patient consent    Rulon Abide, PT, DPT [phone]

## 2015-07-17 NOTE — Unmapped (Signed)
Speech Language Pathology   Reason Patient Not Seen         Name: Linda Ponce  DOB: 07-Jun-1970  Attending Physician: Corrinne Eagle, MD  Admission Diagnosis: jewish 07/05/15 dx: sepsis  Date: 07/17/2015  Precautions: N/A  Reviewed Pertinent hospital course: Yes    Unable to see patient due to :Not applicable: SLP reviewed chart and spoke with patient's nurse Chip Boer. Patient is cleared by GI for sips of clear liquids only  (+TPN for nutrition) and nurse reports patient is tolerating this without any s/s difficulty. Nurse states issue is that patient has refused all meds since her admission. Nurse states meds have been offered whole with her choice of clear liquids, whole in spoonful of applesauce (ok'd by GI surgeon per nurse) and crushed in spoonful of applesauce (ok'd by GI surgeon per nurse). Nurse states that patient has refused all meds in all possible forms of administration.  A dysphagia eval is not appropriate in this case, recommend considering a psychology consult.    Lorel Monaco MACCCSLP [phone]

## 2015-07-17 NOTE — Unmapped (Signed)
Occupational Therapy  [facility]  Occupational Therapy      Name: Linda Ponce  DOB: [birth year]  Attending Physician: Corrinne Eagle, MD  Admission Diagnosis: jewish 07/05/15 dx: sepsis  Date: 07/17/2015    Reviewed Pertinent hospital course: Yes   Hospital Course PT/OT: 41f with history of colorectal CA, initially admitted to [facility] post operatively for continued wound care. Developed bleeding, sent back to [facility] where she was foundto have multiple pelvic abscesses, enterocutaneous fistulas, was kept NPO, started on IV abx, TPN, and returns for continued care.      Brief Assessment  Assessment  Assessment: Decreased ADL status, Decreased self-care trans, Decreased Functional Mobility, Decreased activity tolerance  Prognosis: Guarded  Goal Formulation: Other (comment) (per therapist )    Goals  Pt Will demonstrate functional chair transfer:  (tolerate assessment; by July 17, 2015)  Pt Will participate in upper extremity HEP to prep for ADLs: strengthening / activity tolerance for functional use: Ongoing   Miscellaneous Goal #1: Patient will tolerate sitting EOB with maximal assist: By July 17, 2015  Long Term Goal : Patient to be seen 2-3 week trial period to determine if she can participate and benefit from active OT programming  Time frame for goals to be met in: 3 weeks     Recommendation  Plan  Treatment Interventions: Compensatory technique education, Therapeutic Activity, Patient/Family training, Excercise, ADL retraining, Functional transfer training, Activity Tolerance training, Energy Conservation  OT Frequency: 1-3x/wk    Cognition  Overall Cognitive Status: Within Functional Limits    Pain  Pain Score: 10-Worst pain ever  Pain Location: Generalized  Pain Descriptors: Aching  Pain Intervention(s): Medication (See eMAR)     Mobility  Bed Mobility  Rolling: Max assist to right;Max assist to left    Pt seen with PT as pt unable to tolerate activity due to increase pain and anxiety. Pt  hesitant against any movement; majority of time spent encouraging and reassuring pt that lines and bags will not get in the way of movement. Wound team brought in to also encourage pt but pt continued to refuse. Pt allowed therapist to reposition in bed and move into a sitting position in bed. Pt left upright with PT all needs met and in reach.  Cont per POC in colboration with OTR/L  [name], COTA/L        Time  Start Time: 0930  Stop Time: 1000  Time Calculation (min): 30 min    Charges       $Therapeutic Activity: 23-37 mins

## 2015-07-17 NOTE — Unmapped (Signed)
Problem: Potential for Compromised Skin Integrity  Goal: Skin integrity is maintained or improved  Assess and monitor skin integrity. Identify patients at risk for skin breakdown on admission and per policy. Collaborate with interdisciplinary team and initiate plans and interventions as needed.   Outcome: Progressing  Turn patient,keep skin clean and dry, avoid shearing, relieve pressure to bony prominences and encourage use of moisturizer on skin.

## 2015-07-17 NOTE — Unmapped (Signed)
Patient alert and oriented x4 upon assessment this evening.  Speech is clear, anxious and tearful.  No c/o cough or sore throat, and lung sounds are clear. Vitals are stable.  Patient has c/o pain and discomfort with prn IV pain medication which was effective, and no s/s of distress are evident at this time.  Bed mobility and ADL care are performed with complete staff assistance with pt refusing majority of care. Patient takes via IV well.  Over bed table and call light is within reach.  Side rails are up x2.  Patient with non skid footwear on.  TPN infusing at 62.71ml/ hr without any complications, will continue to monitor.

## 2015-07-17 NOTE — Unmapped (Signed)
Pt out of unit  For an appt. Accompanied by sister. Out via stretcher. Pain med given. Vitals WDL  Blood pressure 119/83, pulse 136, temperature 98.4 ??F (36.9 ??C), temperature source Oral, resp. rate 20, height 5' 2.5 (1.588 m), weight 100 lb 4.8 oz (45.496 kg), SpO2 100 %.  Linda Ponce Linda Ponce

## 2015-07-17 NOTE — Unmapped (Signed)
Pt back to her room from appointment. Pt stable. Sister with patient. No requests at the moment.   [name] Linda Ponce

## 2015-07-17 NOTE — Unmapped (Signed)
Hospitalist Progress Note    07/17/2015     6:38 PM    Linda Ponce   LOS: 9 days       HPI;    Principal Problem:    Fistula  Active Problems:    Abdominal abscess    Enterocutaneous fistula    Malnutrition      Subjective & Interval History;    Afebrile,  requires continuous prn pan Rx for abdominal pain, tachycardic, awake, alert, oriented no distress on room air.    Review of Systems:  As above otherwise negative         PMH: Unchanged from H&P.   PSH: Unchanged from H&P.   Family medical history: Unchanged from H&P.   Social history: Unchanged from H&P      Scheduled Meds:  ??? aspirin  81 mg Per NG tube Daily with breakfast   ??? clopidogrel  75 mg Per NG tube Daily 0900   ??? fentaNYL  1 patch Transdermal Q72H   ??? fluconazole in NaCl (iso-osm)  200 mg Intravenous Q24H   ??? insulin regular  0-5 Units Subcutaneous Q6H   ??? piperacillin-tazobactam (ZOSYN) IV extended interval  3.375 g Intravenous Q8H   ??? vancomycin  750 mg Intravenous Q24H     Continuous Infusions:  ??? TPN ADULT ([facility]) 62.5 mL/hr at 07/17/15 1739     PRN Meds:ALPRAZolam, alteplase, dextrose, dextrose 50 % in water (D50W), HYDROmorphone, ipratropium-albuterol, LORazepam, ondansetron    Objective:    Vital signs in last 24 hours:  Temp:  [98.2 ??F (36.8 ??C)-99.4 ??F (37.4 ??C)] 99.4 ??F (37.4 ??C)  Heart Rate:  [117-136] 134  Resp:  [18-20] 20  BP: (103-126)/(72-83) 103/72 mmHg    Physical Examination:     GENERAL: The patient is awake, alert, oriented?? not in acute cardiorespiratory distress. ??  HEENT:Atraumatic.Normocephalic. PERRLA, EOMI. No icterus, no pallor, no jugular venous distention. ??  NECK: Supple. Trachea is in midline. No Thyromegaly. No Lymphadenopathy.  CHEST: Symmetric. Clear to auscultation, bilateral equal air entry. No wheezing or rhonchi.  CARDIOVASCULAR: S1, S2, Regular pulse. No murmur or gallops. No JVD.  ABDOMEN: Soft, nontender, nondistended, positive bowel sounds.No viscera palpable.  EXTREMITIES: There is no edema in the lower  extremities.?? Pulses are positive in all extremities.Power is 5/5 in all extremities.(+) dry gangrene R foot  ??    Intake/Output last 3 shifts:  I/O last 3 completed shifts:  In: 4290 [I.V.:1290]  Out: 4225 [Urine:2475; Drains:1750]    Recent Labs;  @SLAB @  No results found for: INR, PROTIME    ABG;  No results found for: PHART, PCO2, PO2ART, HCO3ART, BEART, HBO2PER, O2SATART        Last  Culture and Gram Stain;  No results found for: AEROBOT, ANABOT, LABGRAM, ISO2, ISO3, ISO4, ISO5, ISO6, PNAFISH      Diet;    Diet Orders          TPN ADULT ([facility]) at 62.5 mL/hr starting at 04/05 1800    Diet NPO effective now Except for: except ice chips, except sips of clears starting at 03/28 5409          Future Appointments:  No future appointments.    Assessment/Plan:      Colon CA   - will discuss with sister, s/p radiation , surgery but no records avail      ?? Abdominal abscess (07/04/2015)  ????????????????????????- continue abx vanc / zosyn?? and diflucan, duration depending on clinical / radiographic improvement.   -  wound care   - increased fentanyl patch, still requiring prn while awake, will wait to increase further      ????????????????????????  ?? 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   Pager# [phone]    07/17/2015

## 2015-07-17 NOTE — Unmapped (Signed)
Clinical Pharmacy Service - TPN Progress Note    Linda Ponce is a 45 y.o. female followed by the pharmacy department for monitoring and adjustment of TPN    Assessment/Plan:   1) Continue continuous TPN with electrolytes, macronutrients and additives from yesterday  2) Electrolytes listed in chart below:  3) No labs; no changes to electrolytes or macros  4) Pharmacy will continue to monitor electrolytes and make necessary adjustments.      TPN Electrolytes   Sodium Chloride: 60 mEq   Sodium Acetate: 30 mEq   Sodium Phosphate: 0 mmol   Magnesium Sulfate: 16 mEq   Calcium Gluconate: 0 mEq   Potassium Chloride: 15 mEq   Potassium Acetate: 0 mEq   Potassium Phosphate: 0 mmol TPN Macronutrients   Volume: 1500 mL   Rate: 62.5 mL/hr   Amino Acids: 48 g/L    Dextrose: 192 g/L    Lipids: 10 g/L  TPN Additives   MVI 10 mL   TE 1 mL   Ascorbic Acid 200mg      Subjective/Objective Information:    Recent Labs      07/15/15   0839  07/15/15   0840  07/16/15   0540   NA  137   --   140   CL  102   --   103   CO2  30   --   30   BUN  37*   --   41*   CREATININE  0.72   --   0.70   K  SEE COMMENT   --   4.8   PHOS   --   5.6*  4.8*   MG   --   2.5  2.1   CALCIUM  8.0*   --   8.1*   ALBUMIN   --   1.9*  1.6*   GLUCOSE  102*   --   79   AST   --   93*   --    ALT   --   38   --    ALKPHOS   --   126*   --      Recent Labs      07/16/15   1135  07/16/15   1618  07/16/15   2346  07/17/15   0636   POCGMD  101*  137*  105*  104*     Corrected Calcium = (0.8*(4mg /dL - Albumin))+Serum Ca     Thank you for the consult,  [place] D PHELPS, PHARMD   07/17/2015 7:57 AM

## 2015-07-17 NOTE — Unmapped (Signed)
Problem: Daily Care  Goal: Daily care needs are met  Assess and monitor ability to perform self care and identify potential discharge needs.   Outcome: Not Progressing  Pt unable to care for self independently. Only with assistance.

## 2015-07-17 NOTE — Unmapped (Signed)
Problem: Physical Therapy  Goal: Instruct PT/Family on Safe Mobility Practices  Instruct Pt/Family on safe mobility practives including bed mobility, transfer training, w/c mobility training, and gait training when appropriate.    Pt Will Go Supine To Sit: Minimal (to get in and out of bed within 4 weeks. )  Sit To Stand: Min A with LRAD to prepare for gait and transfers within 6 weeks.    Pt Will Transfer Bed/Chair: Minimal (with LRAD to increase independence in home within 6 weeks. )  Pt Will Ambulate: Minimal (25??? with RW to access home within 6 weeks. )  Miscellaneous Goal #1: Pt will propel w/c 150??? with B UEs with supervision to increase independence in home and community within 6 weeks.    Long Term Goal : Pt will be independent with HEP to improve B LE strength to facilitate safe mobility within 8 weeks.     Initial Status: To be assessed For all goals as pt only rolled max A and scooted up to head of bed on evaluation      Outcome: Progressing  Interventions: LE strengthening/ROM, endurance training, patient/family training, gait training, neuromuscular reeducation, stair negotiation, therapeutic activity, therapeutic exercise, wheelchair mobility    Pt to be seen 2-4 x/week for 8 weeks ( 09/14/15). Please refer to treatment notes for patient's current status.       Rulon Abide, PT, DPT [phone]

## 2015-07-17 NOTE — Plan of Care (Signed)
NAME:  Building control surveyor  DATE OF BIRTH:  04-01-1971  MEDICAL RECORD NUMBER:  [id number]  DATE:  07/17/2015    Dressing applied per orders.  Discharge instructions given.  Patient verbalized understanding.  Return to [facility] in 1 week.  Called/faxed orders to [facility]  Follow up with Nadeen Landau next week      ??? Arterial Ulcer - Atherosclerosis      Nursing Diagnosis/Problem    _0  Confirmed Arterial Disease - Identify signs of ischemia    _1  Knowledge deficit related to the diagnosis & management of Arterial Ulcer    Goals    _2  Non invasive Arterial studies ordered    _3   Goal met          _4  Goal not met    _5  Control of pain   _6   Goal met          _7  Goal not met    _8  Verbalize understanding of disease process    _9   Goal met          _10  Goal not met     _11  Improved Oxygenation   _12   Goal met          _13  Goal not met      Nursing Interventions/Evaluation    _14  At every visit assess Pain    _15  Provide education on Arterial Ulcer    _16  Lower extremity assessment each visit          Diagnostic/Treatment activity    _17  non-invasive arterial studies    _18  ABI     _19  Consult to Vascular Physician    I_20  Scheduled Angiogram   _21  Debridement      Electronically signed by Charlett Nose, RN on 07/17/2015 at 1:35 PM

## 2015-07-17 NOTE — Progress Notes (Signed)
[facility]  Progress Note and Procedure Note      Erica Harding  AGE: 45 y.o.   GENDER: female  DOB: 05-Sep-1970  TODAY'S DATE:  07/17/2015    Subjective:     Chief Complaint   Patient presents with   ??? Wound Check     ble         HISTORY of PRESENT ILLNESS HPI     Erica Harding is a 45 y.o. female who presents today for wound evaluation.   History of Wound: The patient had a clot in her femoral artery right leg that was cleared by vascular surgery.  However the patient ended up with necrosis at her distal digits which may be a combination of lack of blood flow and the use of pressors.  Patient's toes have been demarcating for the past 2 weeks.  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  She does not want any treatment at this time on her foot other than painting with Betadine.  Wound Pain:  intermittent, excruciating  Severity:  9 / 10   Wound Type:  arterial and acute ischemic incident  Modifying Factors:  none  Associated Signs/Symptoms:  pain        PAST MEDICAL HISTORY        Diagnosis Date   ??? Colorectal cancer ([place])        PAST SURGICAL HISTORY    Past Surgical History   Procedure Laterality Date   ??? Colonoscopy     ??? Abdomen surgery     ??? Hysterectomy         FAMILY HISTORY    History reviewed. No pertinent family history.    SOCIAL HISTORY    Social History   Substance Use Topics   ??? Smoking status: Never Smoker   ??? Smokeless tobacco: None   ??? Alcohol use No       ALLERGIES    Allergies   Allergen Reactions   ??? Latex Itching   ??? Compazine [Prochlorperazine Maleate] Swelling       MEDICATIONS    Current Outpatient Prescriptions on File Prior to Encounter   Medication Sig Dispense Refill   ??? vancomycin (VANCOCIN) 500 MG/100ML IVPB Infuse 150 mLs intravenously every 24 hours 2500 mL 0   ??? fentaNYL (DURAGESIC) 25 MCG/HR Place 1 patch onto the skin every 72 hours . 10 patch 0   ??? HYDROmorphone (DILAUDID)  1 MG/ML injection Infuse 1 mL intravenously every 2 hours as needed for Pain . 1 mL 0   ??? acetaminophen (OFIRMEV) 10 MG/ML SOLN infusion Infuse 74.1 mLs intravenously every 6 hours as needed for Fever or Pain 4000 mL 0   ??? aspirin 81 MG chewable tablet 1 tablet by Per NG tube route daily 30 tablet 3   ??? LORazepam (ATIVAN) 2 MG/ML injection Infuse 0.5 mLs intravenously every 4 hours as needed (anxiety) 1 mL 0   ??? ipratropium-albuterol (DUONEB) 0.5-2.5 (3) MG/3ML SOLN nebulizer solution Inhale 3 mLs into the lungs every 4 hours as needed for Shortness of Breath 360 mL 0   ??? glucose (GLUTOSE) 40 % GEL Take 15 g by mouth as needed (glc<70) 45 g 1   ??? insulin lispro (HUMALOG) 100 UNIT/ML pen Inject 0-6 Units into the skin every 4 hours 5 Pen 3   ??? ondansetron (ZOFRAN) 4 MG/2ML injection Infuse 2 mLs intravenously every 6  hours as needed for Nausea 15 mL 0   ??? fluconazole (DIFLUCAN) 200MG /100ML IVPB Infuse 100 mLs intravenously every 24 hours 100 mL 0   ??? magnesium sulfate 10-5 MG/ML-% Infuse 100 mLs intravenously as needed (Per IV Magnesium Replacement Protocol) 100 mL 0   ??? potassium chloride 20 MEQ/50ML IVPB Infuse 50 mLs intravenously as needed (Per IV Potassium Replacement Protocol) 50 mL 0   ??? clopidogrel (PLAVIX) 75 MG tablet 1 tablet by Per NG tube route daily 30 tablet 3   ??? fat emulsion 20 % infusion Infuse 250 mLs intravenously twice a week 250 mL 0   ??? famotidine (PEPCID) 20 MG/2ML injection Infuse 2 mLs intravenously daily 2 mL 0     No current facility-administered medications on file prior to encounter.        REVIEW OF SYSTEMS    Pertinent items are noted in HPI.      Objective:        Visit Vitals   ??? BP 118/80   ??? Pulse 136   ??? Resp 16   ??? Ht 5\' 2"  (1.575 m)   ??? Wt 100 lb (45.4 kg)   ??? BMI 18.29 kg/m2       PHYSICAL EXAM    Vascular: Vascular status Intact  palpable pedal pulses, right DP2/4 and PT2/4, left DP2/4 and PT2/4.   Hair growthPresent both lower extremities and feet.  Skin temperature is warm  to warm from pretibial area to distal digits bilateral.  Exam is negative for rubor, pallor, cyanosis or signs of acute vascular compromise bilaterally.  Exam is positive for edema bilateral lower extremity.  Varicosities Absent  bilateral lower extremity.   Neuro: Neurologic status normal bilateral with epicritic Present , proprioceptive Present, vibratory sensationPresent and protopathicPresent  l.  DTR???s Present bilateral Achilles. There were no reproducible neuritic symptoms on exam bilateral feet/ankles.  Derm: Dry gangrene distal aspects of digits one through 5 right foot.  Some ischemic changes with pink tissue beneath the skin from the mid digits to the metatarsal heads.  Ecchymosis Absent  bilateral feet/foot.  Right great toenail partially avulsed.  Musculoskeletal: Pain with palpation of foot 5/5 muscle strength in/eversion and dorsi/plantarflexion bilateral feet.  No gross instability noted.            Assessment:     Patient Active Problem List   Diagnosis   ??? AKI (acute kidney injury) ([place])   ??? Hyperkalemia   ??? Open wnd ant abdomen-comp   ??? Peritonitis and retroperitoneal infections ([place])   ??? Fistula   ??? Ischemic foot   ??? Cachexia ([place])   ??? Postprocedural intra-abdominal sepsis ([place])   ??? Abscess   ??? Dehydration   ??? Encephalopathies   ??? 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 Enterocutaneous fistula   ??? Abnormal CT of the chest   ??? Infiltrate noted on imaging study         Pressure Ulcer 06/25/15 Sacrum Left (Active)   Peri-wound Assessment Pink;Other (Comment) 07/08/2015  2:00 PM   Peri-Wound Color Pink 07/08/2015  2:00 PM   Pressure Ulcer Staging Stage II 07/08/2015  2:00 PM   Assessment Painful;Pale;Red;Other (Comment) 07/08/2015  2:00 PM   Shape irregx2 07/02/2015  2:23 PM   Wound Length (cm) 2 cm 07/08/2015  2:00 PM   Wound Width (cm) 1.5 cm 07/08/2015  2:00 PM   Calculated Wound Size (cm^2) (l*w) 3 cm^2 07/08/2015  2:00 PM   Culture Taken No 07/08/2015  2:00 PM  Dressing Status Intact 07/08/2015 12:00 PM   Dressing  Changed Changed/New 07/07/2015  4:26 PM   Dressing/Treatment Open to air;Protective barrier 07/08/2015  2:00 PM   Wound Cleansed Rinsed/Irrigated with saline 07/02/2015  2:23 PM   Dressing Change Due 07/05/15 07/02/2015  2:23 PM   Drainage Amount None 07/02/2015  2:23 PM   Odor None 07/08/2015  2:00 PM   Pink%Wound Bed 100 07/02/2015  2:23 PM   Yellow%Wound Bed 75 07/02/2015  4:00 AM   Margins Attached edges;Unattached edges 07/08/2015  2:00 PM   Number of days:22       Pressure Ulcer 06/25/15 Hip Right (Active)   Peri-wound Assessment Intact;Fragile 07/08/2015  2:00 PM   Peri-Wound Moisture Other(Comment) 07/08/2015  2:00 PM   Pressure Ulcer Staging Unstagable 07/08/2015  2:00 PM   Assessment Slough 07/08/2015  2:00 PM   Shape oval 07/02/2015  2:23 PM   Wound Length (cm) 2 cm 07/08/2015  2:00 PM   Wound Width (cm) 2 cm 07/08/2015  2:00 PM   Calculated Wound Size (cm^2) (l*w) 4 cm^2 07/08/2015  2:00 PM   Change in Wound Size % (l*w) -166.67 07/08/2015  2:00 PM   Dressing Status Intact 07/08/2015  2:00 PM   Dressing Changed Changed/New 07/07/2015  4:26 PM   Dressing/Treatment Foam 07/08/2015 12:00 PM   Wound Cleansed Rinsed/Irrigated with saline 07/02/2015  2:23 PM   Dressing Change Due 07/05/15 07/02/2015  2:23 PM   Necrotic Type Yellow Fibrin/Slough 07/08/2015  2:00 PM   Drainage Amount None 07/02/2015  2:23 PM   Drainage Description Serosanguinous;Yellow 07/08/2015  2:00 PM   Odor None 07/08/2015  2:00 PM   Yellow%Wound Bed 100 07/08/2015  2:00 PM   Margins Attached edges;Unattached edges 07/08/2015  2:00 PM   Number of days:23       Wound 06/25/15 Other (Comment) Abdomen Anterior;Mid midline abdominal surgical incision (Active)   Peri-wound Assessment Pink;Red;Other (Comment) 07/08/2015  2:00 PM   Peri-Wound Texture Localized edema 07/05/2015  9:00 PM   Peri-Wound Moisture Weeping 07/07/2015  8:19 AM   Peri-Wound Color Pink;Other 07/08/2015  2:00 PM   Non-staged Wound Description Full thickness 07/03/2015  3:06 PM   Assessment Fragile;Red;Other  (Comment) 07/08/2015  2:00 PM   Shape linear/irreg 07/02/2015  2:23 PM   Wound Length (cm) 12 cm 07/08/2015  2:00 PM   Wound Width (cm) 5 cm 07/08/2015  2:00 PM   Depth (cm)  1 07/08/2015  2:00 PM   Calculated Wound Size (cm^2) (l*w) 60 cm^2 07/08/2015  2:00 PM   Change in Wound Size % (l*w) -25 07/08/2015  2:00 PM   Closure Other (Comment) 07/08/2015 12:00 PM   Culture Taken No 07/08/2015  2:00 PM   Dressing Status Changed;Other (Comment) 07/08/2015  2:00 PM   Dressing Changed Dressing reinforced 07/08/2015 12:00 PM   Dressing/Treatment Dry dressing;Other (Comment) 07/08/2015 12:00 PM   Wound Cleansed Rinsed/Irrigated with saline 07/08/2015  2:00 PM   Dressing Change Due 07/02/15 07/02/2015  2:23 PM   Drainage Amount Large 07/08/2015  2:00 PM   Drainage Description Brown;Green 07/08/2015  2:00 PM   Odor None 07/08/2015  2:00 PM   Margins Unattached edges 07/08/2015  2:00 PM   Procedural Pain 10 06/25/2015  1:15 PM   Number of days:22       Wound 06/25/15 Other (Comment) Abdomen Left;Lateral open incision to left lateral abdominal wall (Active)   Peri-wound Assessment Intact;Fragile;Pink 07/08/2015  2:00 PM   Peri-Wound Texture Localized edema  07/05/2015  9:00 PM   Peri-Wound Moisture Other(Comment) 07/08/2015  2:00 PM   Peri-Wound Color Pink 07/07/2015  8:19 AM   Non-staged Wound Description Full thickness 07/03/2015 12:52 AM   Assessment Fragile;Pink;Red;Other (Comment) 07/08/2015  2:00 PM   Shape oval 07/02/2015  2:23 PM   Wound Length (cm) 6 cm 07/08/2015  2:00 PM   Wound Width (cm) 1 cm 07/08/2015  2:00 PM   Depth (cm)  1.5 07/08/2015  2:00 PM   Calculated Wound Size (cm^2) (l*w) 6 cm^2 07/08/2015  2:00 PM   Change in Wound Size % (l*w) 0 07/08/2015  2:00 PM   Closure None 07/08/2015 12:00 PM   Dressing Status Intact 07/08/2015  2:00 PM   Dressing Changed Dressing reinforced 07/08/2015 12:00 PM   Dressing/Treatment Other (Comment) 07/08/2015  2:00 PM   Wound Cleansed Rinsed/Irrigated with saline 07/02/2015  2:23 PM   Dressing Change Due 06/29/15  06/29/2015  1:00 PM   Drainage Amount Large 07/08/2015  2:00 PM   Drainage Description Brown;Green 07/08/2015  2:00 PM   Odor None 07/08/2015  2:00 PM   Pink%Wound Bed 100 07/02/2015  2:23 PM   Margins Unattached edges 07/08/2015  2:00 PM   Number of days:22       Wound 07/05/15 Other (Comment) Foot Right all 5 toes on right foot (Active)   Peri-wound Assessment Black 07/08/2015 12:00 PM   Peri-Wound Moisture Dry;Painful;Swelling 07/07/2015  8:19 AM   Peri-Wound Color Red 07/07/2015  8:19 AM   Assessment Black;Dry;Red 07/08/2015 12:00 PM   Closure Open to air 07/08/2015 12:00 PM   Necrotic Amount Large: 67-100% 07/07/2015  8:19 AM   Drainage Amount None 07/08/2015 12:00 PM   Number of days:12           Plan:   Continue palliative care for the digits of the right foot.  Patient understands that she will need an amputation in the future.  However the patient would like to focus on her abdominal wounds and trying to get them healed at this time before undergoing any further surgery.  The nature of the patient's condition was explained in depth. The patient was informed that their compliance to the treatment plan is paramount to successful healing and prevention of further ulceration and/or infection   Patient refuses treatment for avulsed nail.  This nail will most likely be removed on accident with patient transfers.  Discharge Treatment  Continue to paint with Betadine daily the patient will need to follow up with me after the toes of fully demarcated or if any infection sets in.    Written Patient Discharge Instructions Given            Electronically signed by Wende Bushy, DPM on 07/18/2015 at 12:11 PM

## 2015-07-18 LAB — CBC
Hematocrit: 31.9 % — ABNORMAL LOW (ref 35.0–45.0)
Hemoglobin: 10.6 g/dL — ABNORMAL LOW (ref 11.7–15.5)
MCH: 29 pg (ref 27.0–33.0)
MCHC: 33.3 g/dL (ref 32.0–36.0)
MCV: 87.1 fL (ref 80.0–100.0)
MPV: 7.2 fL — ABNORMAL LOW (ref 7.5–11.5)
Platelets: 682 10E3/uL — ABNORMAL HIGH (ref 140–400)
RBC: 3.66 10E6/uL — ABNORMAL LOW (ref 3.80–5.10)
RDW: 17.6 % — ABNORMAL HIGH (ref 11.0–15.0)
WBC: 13.4 10E3/uL — ABNORMAL HIGH (ref 3.8–10.8)

## 2015-07-18 LAB — POC GLU MONITORING DEVICE
POC Glucose Monitoring Device: 113 mg/dL — ABNORMAL HIGH (ref 70–100)
POC Glucose Monitoring Device: 126 mg/dL (ref 70–100)
POC Glucose Monitoring Device: 145 mg/dL (ref 70–100)
POC Glucose Monitoring Device: 114 mg/dL (ref 70–100)

## 2015-07-18 LAB — RENAL FUNCTION PANEL W/EGFR
Albumin: 1.6 g/dL — ABNORMAL LOW (ref 3.5–5.7)
Anion Gap: 7 mmol/L (ref 3–16)
BUN: 41 mg/dL — ABNORMAL HIGH (ref 7–25)
CO2: 31 mmol/L (ref 21–33)
Calcium: 7.9 mg/dL — ABNORMAL LOW (ref 8.6–10.3)
Chloride: 101 mmol/L (ref 98–110)
Creatinine: 0.78 mg/dL (ref 0.60–1.30)
Glucose: 81 mg/dL (ref 70–100)
Osmolality, Calculated: 297 mosm/kg (ref 278–305)
Phosphorus: 2.5 mg/dL (ref 2.1–4.7)
Potassium: 3.9 mmol/L (ref 3.5–5.3)
Sodium: 139 mmol/L (ref 133–146)
eGFR AA CKD-EPI: >90 See note.
eGFR NONAA CKD-EPI: >90 See note.

## 2015-07-18 LAB — MAGNESIUM: Magnesium: 2.1 mg/dL (ref 1.5–2.5)

## 2015-07-18 LAB — VANCOMYCIN, TROUGH: Vancomycin Tr: 17.5 ug/mL (ref 10.0–20.0)

## 2015-07-18 MED ORDER — fentaNYL (DURAGESIC) 100 mcg/hr 1 patch
100 | TRANSDERMAL | Status: AC
Start: 2015-07-18 — End: 2015-08-08
  Administered 2015-07-18 – 2015-08-05 (×7): 1 via TRANSDERMAL

## 2015-07-18 MED ORDER — TPN ADULT (WCH/Drake)
4 | INTRAVENOUS | Status: AC
Start: 2015-07-18 — End: 2015-07-18
  Administered 2015-07-18: 22:00:00 via INTRAVENOUS

## 2015-07-18 MED ORDER — TPN ADULT (WCH/Drake)
10 | INTRAVENOUS | Status: AC
Start: 2015-07-18 — End: 2015-07-18

## 2015-07-18 MED ORDER — heparin lock flush 100 unit/mL Syrg 500 Units
100 | INTRAVENOUS | Status: AC | PRN
Start: 2015-07-18 — End: 2015-09-13
  Administered 2015-07-18: 19:00:00 500 [IU]

## 2015-07-18 MED FILL — HYDROMORPHONE 2 MG/ML INJECTION SYRINGE: 2 2 mg/mL | INTRAMUSCULAR | Qty: 2

## 2015-07-18 MED FILL — TRAVASOL 10 % INTRAVENOUS SOLUTION: 10 10 % | INTRAVENOUS | Qty: 720

## 2015-07-18 MED FILL — PIPERACILLIN-TAZOBACTAM 3.375 GRAM INTRAVENOUS SOLUTION: 3.375 3.375 gram | INTRAVENOUS | Qty: 1

## 2015-07-18 MED FILL — FLUCONAZOLE 200 MG/100 ML IN SOD. CHLORIDE (ISO) INTRAVENOUS PIGGYBACK: 200 200 mg/100 mL | INTRAVENOUS | Qty: 100

## 2015-07-18 MED FILL — HEPARIN LOCK FLUSH (PORCINE) (PF) 100 UNIT/ML INTRAVENOUS SYRINGE: 100 100 unit/mL | INTRAVENOUS | Qty: 5

## 2015-07-18 MED FILL — FENTANYL 100 MCG/HR TRANSDERMAL PATCH: 100 100 mcg/hr | TRANSDERMAL | Qty: 1

## 2015-07-18 MED FILL — VANCOMYCIN 1,000 MG INTRAVENOUS INJECTION: 1000 1000 mg | INTRAVENOUS | Qty: 750

## 2015-07-18 NOTE — Unmapped (Signed)
Call to phar trough drawn 0500 dose to be given now jordac awarelevel given as ord

## 2015-07-18 NOTE — Unmapped (Signed)
Phar called tpn placed at 9am  Not correct per [place]

## 2015-07-18 NOTE — Unmapped (Signed)
Physical Therapy                                              Physical Therapy Treatment     Name: Linda Ponce  DOB: 11/01/1970  Attending Physician: Corrinne Eagle, MD  Admission Diagnosis: jewish 07/05/15 dx: sepsis  Date: 07/18/2015    Reviewed Pertinent hospital course: Yes  Hospital Course PT/OT: 5f with history of colorectal CA, initially admitted to [facility] post operatively for continued wound care. Developed bleeding, sent back to [facility] where she was foundto have multiple pelvic abscesses, enterocutaneous fistulas, was kept NPO, started on IV abx, TPN, and returns for continued care. PRECAUTIONS: FULL CODE, AAT, NO WEIGHT BEARING RESTRICTIONS, ISCHEMIC R TOES, MULTIPLE DRAINS FROM ABDOMEN AND BACK     Assessment     Assessment: Impaired Bed Mobility, Impaired Transfer Mobility, Impaired Ambulation, Impaired Balance, Deconditioning, Impaired Strength, Impaired ROM  Prognosis: Guarded (due to multiple medical issues )    Goals  Pt Will Go Supine To Sit: Minimal (to get in and out of bed within 4 weeks. )  Sit To Stand: Min A with LRAD to prepare for gait and transfers within 6 weeks.   Pt Will Transfer Bed/Chair: Minimal (with LRAD to increase independence in home within 6 weeks. )  Pt Will Ambulate: Minimal (25' with RW to access home within 6 weeks. )  Miscellaneous Goal #1: Pt will propel w/c 150' with B UEs with supervision to increase independence in home and community within 6 weeks.   Long Term Goal : Pt will be independent with HEP to improve B LE strength to facilitate safe mobility within 8 weeks.   Time frame for goals to be met in: 8 weeks, 09/14/15    Recommendation  Plan  Treatment/Interventions: LE strengthening/ROM, Endurance training, Patient/family training, Equipment eval/education, Museum/gallery curator, Therapeutic Exercise, Therapeutic Activity, Continued evaluation, Wheelchair Mobility, Compensatory technique education, Neuromuscular Reeducation, Gait training  PT Frequency:  (2-4x/wk  )    Recommendation  Recommendation: Short-term skilled PT  Equipment Recommended: Defer at this time    Problem List  Patient Active Problem List   Diagnosis   ??? Abdominal abscess   ??? Enterocutaneous fistula   ??? Malnutrition   ??? Fistula        Past Medical History  Past Medical History   Diagnosis Date   ??? Cancer    ??? Abscess of abdominal cavity    ??? Gangrene of foot    ??? AKI (acute kidney injury)    ??? Fistula    ??? Peritonitis and retroperitoneal infections    ??? Cachexia    ??? Hyperkalemia    ??? Dehydration    ??? Encephalopathies    ??? Ischemic foot         Past Surgical History  Past Surgical History   Procedure Laterality Date   ??? Colon surgery         Cognition:        Pain:  Pain Score:   8         Mobility:  Bed Mobility  Rolling: Max assist to left;Max assist to right  Supine to Sit: Max assist to left (x 2 due to patient hesistant and resisting movement )  Sit to Supine: Max assist to right ( x2 )      Exercise:    Balance Training: Sitting with  perturbations all planes  B LE seated knee flex/ext x 5 reps, B DF/PF x 5 reps     Pt seen with OT this date as patient is unable to tolerate increase amounts of activity or movement. Pt refusing EOB at beginning of session; pt with multiple excuses as to why she wouldn't be able to do it. After much encouragement pt moved to EOB Max A x 2. Pt tolerated 10 mins EOB Mod I and completed minimal exercises with LE's. Pt returned supine EOS all needs met and in reach.           Patient Education  Importance of OOB, safety with transfers    Time  Start Time: 1030  Stop Time: 1110  Time Calculation (min): 40 min    Charges       $Therapeutic Activity: 40 mins    Rulon Abide, [place], DPT [phone]

## 2015-07-18 NOTE — Unmapped (Signed)
Speech Language Pathology      Name: Linda Ponce  DOB: 1970-08-02  Attending Physician: Corrinne Eagle, MD  Admission Diagnosis: jewish 07/05/15 dx: sepsis  Date: 07/18/2015  Pt's swallow was screened per nursing request today.  This therapist spoke to the pt re her history of swallowing difficulties.  Pt reports getting choked on a small piece of meat and a pill in the past and due to this she is scared of swallowing pills.  Pt observed with thin by straw without clinical s/s of aspiration.  Pt's swallow timely and laryngeal elevation WFLs.  Discussed that pt is not a candidate for MBS due to her PO restrictions and pt reported that she would not do a FEES.  Nursing present and pt did agree to try to take her pills po next time.  No pharyngeal swallow deficits noted at this time.    Time  1445 to 1450  Charges   No charges    Jola Baptist MA CCC-SLP [phone]  Pager number [phone]

## 2015-07-18 NOTE — Unmapped (Signed)
Problem: Knowledge Deficit  Goal: Patient/family/caregiver demonstrates understanding of disease process, treatment plan, medications, and discharge instructions  Complete learning assessment and assess knowledge base.   Intervention: Provide teaching via preferred learning method  Educate to iv changes drsg

## 2015-07-18 NOTE — Unmapped (Signed)
[name]  Nutrition Progress Note        Nutrition Diagnosis:   1.  Altered GI Function - Continues  New Nutrition Diagnosis:  No    Diet: NPO x sips of clears  TPN:  62.5 ml/h = 1500 ml total volume per day, AA 72 gram, Dextrose 287.7 gram, Lipid 15 gram = 1416.18 kcal  PO Intake: 120-240 ml po clear liquids per shift  Weight:  (refused to be weighed) (07/14/15 0400)  Skin Condition: left lateral abd fistula slightly smaller, mid abd wound with fistula some granulation and some non granulation, right lateral hip unstageable improving, stage 3 coccyx no change  Edema: none    Labs:  Lab Results   Component Value Date    CREATININE 0.78 07/18/2015    BUN 41* 07/18/2015    NA 139 07/18/2015    K 3.9 07/18/2015    CL 101 07/18/2015    CO2 31 07/18/2015     Lab Results   Component Value Date    OSMOLALITY 297 07/18/2015     Lab Results   Component Value Date    CALCIUM 7.9* 07/18/2015    PHOS 2.5 07/18/2015    GLUCOSE 81 07/18/2015     No results found for: HGBA1C  Lab Results   Component Value Date    WBC 13.4* 07/18/2015    HGB 10.6* 07/18/2015    HCT 31.9* 07/18/2015    MCV 87.1 07/18/2015    PLT 682* 07/18/2015         Component Value Date/Time    POCGMD 145* 07/18/2015 1129    POCGMD 126* 07/18/2015 0546    POCGMD 113* 07/18/2015 0001    POCGMD 121* 07/17/2015 1601    POCGMD 110* 07/17/2015 1116     No results found for: VITD25H      Scheduled Meds:  ??? aspirin  81 mg Per NG tube Daily with breakfast   ??? clopidogrel  75 mg Per NG tube Daily 0900   ??? fentaNYL  1 patch Transdermal Q72H   ??? fluconazole in NaCl (iso-osm)  200 mg Intravenous Q24H   ??? insulin regular  0-5 Units Subcutaneous Q6H   ??? piperacillin-tazobactam (ZOSYN) IV extended interval  3.375 g Intravenous Q8H   ??? vancomycin  750 mg Intravenous Q24H     Continuous Infusions:  ??? TPN ADULT ([facility])       PRN Meds:.ALPRAZolam, alteplase, dextrose, dextrose 50 % in water (D50W), heparin lock flush, HYDROmorphone, ipratropium-albuterol, LORazepam,  ondansetron    Note:  Continues on TPN macronutrients and volume same. Updated weight unavailable as pt refused to be weighed.  Note electrolytes KPhos and KCl adjusted per Pharmacy and increased sodium acetate   Serum Na WNL. BUN elevated. Taking in some clear liquids PO. Asks how long does she need TPN, explanation of fistulas and bowel rest to promote healing explained. Acknowledged understanding. Blood sugars in adequate control.    Nutrition Prescription:   Food and Nutrition Therapy:  Continue current TPN macronutrient formulation, clear liquids po  Coordination of Nutrition Care: Wound Care Team, Pharmacy    Monitoring:weight trend, labs, Intake.  Plan of Care:Continue    Follow Up: [address]    Mackey Birchwood RDN, LD  Pager [phone] Phone [phone]    07/18/2015 4:07 PM

## 2015-07-18 NOTE — Unmapped (Signed)
Hospitalist Progress Note    07/18/2015     1:32 PM    Linda Ponce   LOS: 10 days       HPI;    Principal Problem:    Fistula  Active Problems:    Abdominal abscess    Enterocutaneous fistula    Malnutrition      Subjective & Interval History;    Afebrile,  requires continuous prn pan Rx for abdominal pain, tachycardic, awake, alert, oriented no distress on room air.    Review of Systems:  As above otherwise negative         PMH: Unchanged from H&P.   PSH: Unchanged from H&P.   Family medical history: Unchanged from H&P.   Social history: Unchanged from H&P      Scheduled Meds:  ??? aspirin  81 mg Per NG tube Daily with breakfast   ??? clopidogrel  75 mg Per NG tube Daily 0900   ??? fentaNYL  1 patch Transdermal Q72H   ??? fluconazole in NaCl (iso-osm)  200 mg Intravenous Q24H   ??? insulin regular  0-5 Units Subcutaneous Q6H   ??? piperacillin-tazobactam (ZOSYN) IV extended interval  3.375 g Intravenous Q8H   ??? vancomycin  750 mg Intravenous Q24H     Continuous Infusions:  ??? TPN ADULT ([facility])       PRN Meds:ALPRAZolam, alteplase, dextrose, dextrose 50 % in water (D50W), heparin lock flush, HYDROmorphone, ipratropium-albuterol, LORazepam, ondansetron    Objective:    Vital signs in last 24 hours:  Temp:  [97 ??F (36.1 ??C)-99.4 ??F (37.4 ??C)] 98 ??F (36.7 ??C)  Heart Rate:  [98-134] 111  Resp:  [17-20] 20  BP: (103-112)/(69-80) 110/73 mmHg    Physical Examination:     GENERAL: The patient is awake, alert, oriented?? not in acute cardiorespiratory distress. ??  HEENT:Atraumatic.Normocephalic. PERRLA, EOMI. No icterus, no pallor, no jugular venous distention. ??  NECK: Supple. Trachea is in midline. No Thyromegaly. No Lymphadenopathy.  CHEST: Symmetric. Clear to auscultation, bilateral equal air entry. No wheezing or rhonchi.  CARDIOVASCULAR: S1, S2, Regular pulse. No murmur or gallops. No JVD.  ABDOMEN: Soft, nontender, nondistended, positive bowel sounds.No viscera palpable.  EXTREMITIES: There is no edema in the lower  extremities.?? Pulses are positive in all extremities.Power is 5/5 in all extremities.(+) dry gangrene R foot  ??    Intake/Output last 3 shifts:  I/O last 3 completed shifts:  In: 3924 [P.O.:240; I.V.:950]  Out: 2950 [Urine:850; Drains:2100]    Recent Labs;  @SLAB @  No results found for: INR, PROTIME    ABG;  No results found for: PHART, PCO2, PO2ART, HCO3ART, BEART, HBO2PER, O2SATART        Last  Culture and Gram Stain;  No results found for: AEROBOT, ANABOT, LABGRAM, ISO2, ISO3, ISO4, ISO5, ISO6, PNAFISH      Diet;    Diet Orders          TPN ADULT ([facility]) at 62.5 mL/hr starting at 04/06 1800    Diet NPO effective now Except for: except ice chips, except sips of clears starting at 03/28 6578          Future Appointments:  No future appointments.    Assessment/Plan:      Colon CA   - will discuss with sister, s/p radiation , surgery but no records avail      ?? Abdominal abscess (07/04/2015)  ????????????????????????- continue abx vanc / zosyn?? and diflucan, duration depending on clinical / radiographic improvement.   -  wound care   - increase fentanyl patch, still requiring prn while awake, will wait to increase further      ????????????????????????  ?? 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   Pager# [phone]    07/18/2015

## 2015-07-18 NOTE — Unmapped (Signed)
Problem: Discharge Planning  Goal: Patient???s discharge needs are met  Collaborate with interdisciplinary team and initiate plans and interventions as needed.   Outcome: Progressing  Ongoing discharge planning when medically stable.      Comments:   The case management department provided an update regarding patient???s plan of care today during Interdisciplinary Rounds.    With: Therapy, Dietician, Nursing, WCT, SW, RNCM, MD    WCT reports fistula's remain the same - pouches seem to be working well.  Continues with TPN - labs and weight being monitored.  Patient has progressed to sitting on the edge of bed with therapy.  She was also begin a stretching program.  Patient complains of pain being uncontrolled.  Physician present and agreed to increase Fentanyl patch.    Discharge recommendation is to SNF    Barriers to discharge at this time include: pain management, IV antibiotics

## 2015-07-18 NOTE — Unmapped (Signed)
Pharmacy Department - Vancomycin Dosing      Linda Ponce is a 45 y.o. female that is being followed by the clinical pharmacy department for monitoring and adjustment of Vancomycin therapy.    Indication: Pelvic Abscess   Goal Trough: 10-15 mg/L  Other Antibiotics: Yes; Fluconazole    Plan:  1. Trough=17.5 (drawn 4 hours early; continue dose as is - 750mg  q24h  2. Trough set for 4/10 0830        Lab 07/18/15  0518 07/16/15  0540 07/15/15  0839 07/13/15  0437   VANCOMYCIN TROUGH 17.5  --   --  16.1   WBC 13.4* 12.3*  --   --    CREATININE 0.78 0.70 0.72  --        CrCl cannot be calculated (Unknown ideal weight.).  BP 110/73 mmHg   Pulse 111   Temp(Src) 98 ??F (36.7 ??C) (Axillary)   Resp 20   Ht 5' 2.5 (1.588 m)   Wt 100 lb 4.8 oz (45.496 kg)   BMI 18.04 kg/m2   SpO2 98%  Female patients must weigh at least 45.5 kg to calculate ideal body weight    documented within (last 72 hours)     Date/Time Action Medication Dose Rate    07/18/15 0915 Given    vancomycin (VANCOCIN) 750 mg in sodium chloride 0.9 % 150 mL IVPB 750 mg 150 mL/hr    07/17/15 1015 Given    vancomycin (VANCOCIN) 750 mg in sodium chloride 0.9 % 150 mL IVPB 750 mg 150 mL/hr    07/16/15 0940 Given    vancomycin (VANCOCIN) 750 mg in sodium chloride 0.9 % 150 mL IVPB 750 mg 150 mL/hr          Microbiology Results     No orders found from 07/12/2015 to 07/19/2015.          Pharmacy will continue to monitor renal function and will adjust accordingly    Thank you for the consult    [place] D PHELPS, PHARMD  07/18/2015 1:31 PM

## 2015-07-18 NOTE — Unmapped (Addendum)
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   Adequate intake via PO and TPN to support healing of areas on skin as diagnosis allows.   Outcome: Progressing  Updated wt not available r/t pt declined to be weighed  Blood sugars in good control 80-180 range  LFT mildly elevated  Se Na WNL  Takes some PO clear liquids, cont. TPN

## 2015-07-18 NOTE — Unmapped (Signed)
Clinical Pharmacy Service - TPN Progress Note    Linda Ponce is a 45 y.o. female followed by the pharmacy department for monitoring and adjustment of TPN    Assessment/Plan:   1) Continue continuous TPN with electrolytes, macronutrients and additives from yesterday  2) Electrolytes listed in chart below:  3) CO2=31; removed sodium acetate from bag  4) K+ 3.9, phos=2.5; increase KPhos to and KCl to  5) Corrected Calcium = 9.7 - ok  6) Pharmacy will continue to monitor electrolytes and make necessary adjustments.      TPN Electrolytes   Sodium Chloride: 80 mEq   Sodium Acetate: 0 mEq   Sodium Phosphate: 0 mmol   Magnesium Sulfate: 16 mEq   Calcium Gluconate: 0 mEq   Potassium Chloride: 20 mEq   Potassium Acetate: 0 mEq   Potassium Phosphate: 20 mmol TPN Macronutrients   Volume: 1500 mL   Rate: 62.5 mL/hr   Amino Acids: 48 g/L    Dextrose: 192 g/L    Lipids: 10 g/L  TPN Additives   MVI 10 mL   TE 1 mL   Ascorbic Acid 500mg      Subjective/Objective Information:    Recent Labs      07/15/15   0839  07/15/15   0840  07/16/15   0540  07/18/15   0518   NA  137   --   140  139   CL  102   --   103  101   CO2  30   --   30  31   BUN  37*   --   41*  41*   CREATININE  0.72   --   0.70  0.78   K  SEE COMMENT   --   4.8  3.9   PHOS   --   5.6*  4.8*  2.5   MG   --   2.5  2.1  2.1   CALCIUM  8.0*   --   8.1*  7.9*   ALBUMIN   --   1.9*  1.6*  1.6*   GLUCOSE  102*   --   79  81   AST   --   93*   --    --    ALT   --   38   --    --    ALKPHOS   --   126*   --    --      Recent Labs      07/17/15   1116  07/17/15   1601  07/18/15   0001  07/18/15   0546   POCGMD  110*  121*  113*  126*     Corrected Calcium = (0.8*(4mg /dL - Albumin))+Serum Ca     Thank you for the consult,  [place] D PHELPS, PHARMD   07/18/2015 8:38 AM

## 2015-07-18 NOTE — Unmapped (Signed)
Occupational Therapy  [facility]  Occupational Therapy      Name: Linda Ponce  DOB: [birth year]  Attending Physician: Corrinne Eagle, MD  Admission Diagnosis: jewish 07/05/15 dx: sepsis  Date: 07/18/2015    Reviewed Pertinent hospital course: Yes   Hospital Course PT/OT: 40f with history of colorectal CA, initially admitted to [facility] post operatively for continued wound care. Developed bleeding, sent back to [facility] where she was foundto have multiple pelvic abscesses, enterocutaneous fistulas, was kept NPO, started on IV abx, TPN, and returns for continued care. PRECAUTIONS: FULL CODE, AAT, NO WEIGHT BEARING RESTRICTIONS, ISCHEMIC R TOES, MULTIPLE DRAINS FROM ABDOMEN AND BACK       Brief Assessment  Assessment  Assessment: Decreased ADL status, Decreased self-care trans, Decreased Functional Mobility, Decreased activity tolerance  Prognosis: Guarded  Goal Formulation: Other (comment) (per therapist )    Goals  Pt Will demonstrate functional chair transfer:  (tolerate assessment; by July 17, 2015)  Pt Will participate in upper extremity HEP to prep for ADLs: strengthening / activity tolerance for functional use: Ongoing   Miscellaneous Goal #1: Patient will tolerate sitting EOB with maximal assist: By July 17, 2015  Long Term Goal : Patient to be seen 2-3 week trial period to determine if she can participate and benefit from active OT programming  Time frame for goals to be met in: 3 weeks     Recommendation  Plan  Treatment Interventions: Compensatory technique education, Therapeutic Activity, Patient/Family training, Excercise, ADL retraining, Functional transfer training, Activity Tolerance training, Energy Conservation  OT Frequency: 1-3x/wk    Cognition  Overall Cognitive Status: Within Functional Limits    Pain  Pain Score:   7  Pain Location: Generalized  Pain Descriptors: Aching;Discomfort  Pain Intervention(s): Medication (See eMAR)     Mobility  Bed Mobility  Rolling: Max assist to  left;Max assist to right  Supine to Sit: Max assist to left (x 2 due to patient hesistant and resisting movement )  Sit to Supine: Max assist to right ( x2 )      Exercises  Interventions  Balance Training: Sitting with perturbations all planes    Pt seen with PT this date as patient is unable to tolerate increase amounts of activity or movement. Pt refusing EOB at beginning of session; pt with multiple excuses as to why she wouldn't be able to do it. After much encouragement pt moved to EOB Max A x 2. Pt tolerated 10 mins EOB Mod I and completed minimal exercises with LE's. Pt returned supine EOS all needs met and in reach.  Cont per POC in colboration with OTR/L  [name], COTA/L       Patient Education:  Benefits and importance of getting out of bed     Time  Start Time: 1030  Stop Time: 1110  Time Calculation (min): 40 min    Charges       $Neuromuscular Re-education: 8-22 mins  $Therapeutic Activity: 23-37 mins

## 2015-07-19 LAB — POC GLU MONITORING DEVICE
POC Glucose Monitoring Device: 120 mg/dL (ref 70–100)
POC Glucose Monitoring Device: 115 mg/dL — ABNORMAL HIGH (ref 70–100)
POC Glucose Monitoring Device: 113 mg/dL (ref 70–100)
POC Glucose Monitoring Device: 122 mg/dL — ABNORMAL HIGH (ref 70–100)

## 2015-07-19 MED ORDER — TPN ADULT (WCH/Drake)
10 | INTRAVENOUS | Status: AC
Start: 2015-07-19 — End: 2015-07-20
  Administered 2015-07-19: 23:00:00 via INTRAVENOUS

## 2015-07-19 MED FILL — HYDROMORPHONE 2 MG/ML INJECTION SYRINGE: 2 2 mg/mL | INTRAMUSCULAR | Qty: 2

## 2015-07-19 MED FILL — VANCOMYCIN 1,000 MG INTRAVENOUS INJECTION: 1000 1000 mg | INTRAVENOUS | Qty: 750

## 2015-07-19 MED FILL — PIPERACILLIN-TAZOBACTAM 3.375 GRAM INTRAVENOUS SOLUTION: 3.375 3.375 gram | INTRAVENOUS | Qty: 1

## 2015-07-19 MED FILL — CLOPIDOGREL 75 MG TABLET: 75 75 mg | ORAL | Qty: 1

## 2015-07-19 MED FILL — FLUCONAZOLE 200 MG/100 ML IN SOD. CHLORIDE (ISO) INTRAVENOUS PIGGYBACK: 200 200 mg/100 mL | INTRAVENOUS | Qty: 100

## 2015-07-19 MED FILL — ASPIRIN 81 MG CHEWABLE TABLET: 81 81 MG | ORAL | Qty: 1

## 2015-07-19 MED FILL — ONDANSETRON HCL (PF) 4 MG/2 ML INJECTION SOLUTION: 4 4 mg/2 mL | INTRAMUSCULAR | Qty: 2

## 2015-07-19 MED FILL — TRAVASOL 10 % INTRAVENOUS SOLUTION: 10 10 % | INTRAVENOUS | Qty: 720

## 2015-07-19 NOTE — Unmapped (Signed)
Clinical Pharmacy Service - TPN Progress Note    Linda Ponce is a 44 y.o. female followed by the pharmacy department for monitoring and adjustment of TPN    Assessment/Plan:   1) Continue continuous TPN with electrolytes, macronutrients and additives from yesterday  2) Electrolytes listed in chart below:  3) No new lab draws, no changes to TPN  4) Pharmacy will continue to monitor electrolytes and make necessary adjustments.      TPN Electrolytes   Sodium Chloride: 80 mEq   Sodium Acetate: 0 mEq   Sodium Phosphate: 0 mmol   Magnesium Sulfate: 16 mEq   Calcium Gluconate: 0 mEq   Potassium Chloride: 20 mEq   Potassium Acetate: 0 mEq   Potassium Phosphate: 20 mmol TPN Macronutrients   Volume: 1500 mL   Rate: 62.5 mL/hr   Amino Acids: 48 g/L    Dextrose: 192 g/L    Lipids: 10 g/L  TPN Additives   MVI 10 mL   TE 1 mL   Ascorbic Acid 500mg       Subjective/Objective Information:    Recent Labs      07/18/15   0518   NA  139   CL  101   CO2  31   BUN  41*   CREATININE  0.78   K  3.9   PHOS  2.5   MG  2.1   CALCIUM  7.9*   ALBUMIN  1.6*   GLUCOSE  81     Recent Labs      07/18/15   1129  07/18/15   1629  07/18/15   2336  07/19/15   0459   POCGMD  145*  114*  120*  115*     Corrected Calcium = (0.8*(4mg /dL - Albumin))+Serum Ca     Thank you for the consult,  [place] D PHELPS, PHARMD   07/19/2015 7:43 AM

## 2015-07-19 NOTE — Unmapped (Signed)
IV ATB continues as rx'd without any adverse side effects. IV site WNL flushes with blood return. Tolerating TPN without difficulty, CBG as rx'd, hung and verified with another RN as rx'd Foley catheter patent draining scant clear yellow urine, foley care as rx'd, no complications noted from foley catheter PRN pain meds given with positive effects noted Fall precautions maintained, call light within reach, patient utilizes call light to ask for assistance as needed.   Arm band is secure, uncluttered walking pathways and adequate room lighting is maintained.  Call light and bed side table are within reach.  Bed is in low position, wheels locked, side rails per policy.  Non skid footwear is provided.  No acute distress noted Vitals signs stable

## 2015-07-19 NOTE — Unmapped (Signed)
Problem: Potential for Compromised Skin Integrity  Goal: Skin integrity is maintained or improved  Assess and monitor skin integrity. Identify patients at risk for skin breakdown on admission and per policy. Collaborate with interdisciplinary team and initiate plans and interventions as needed.   Outcome: Progressing  Avoid shearing, keep skin clean and dry, turn patient, encourage use of moisturizers on skin.

## 2015-07-19 NOTE — Unmapped (Signed)
Patient alert and oriented x4 upon assessment this evening.  Speech is clear, and patient appears calm.  No c/o cough or sore throat, and lung sounds are clear. Vitals are stable.  Patient has c/o pain and discomfort with prn pain medication administration which was effective, and no s/s of distress are evident at this time.  Bed mobility and ADL care are performed with complete staff assistance and without complication. Patient takes medications via IV well.  Over bed table and call light is within reach.  Side rails are up x2.  Patient with non skid footwear on. TON infusing at 62.53ml/hr without any complications, will continue yo monitor.

## 2015-07-19 NOTE — Unmapped (Signed)
Hospitalist Progress Note    07/19/2015     6:15 PM    Linda Ponce   LOS: 11 days       HPI;    Principal Problem:    Fistula  Active Problems:    Abdominal abscess    Enterocutaneous fistula    Malnutrition      Subjective & Interval History;    Afebrile,  requires continuous prn pan Rx for abdominal pain, pain slightly improved, no new complaint    Review of Systems:  As above otherwise negative         PMH: Unchanged from H&P.   PSH: Unchanged from H&P.   Family medical history: Unchanged from H&P.   Social history: Unchanged from H&P      Scheduled Meds:  ??? aspirin  81 mg Per NG tube Daily with breakfast   ??? clopidogrel  75 mg Per NG tube Daily 0900   ??? fentaNYL  1 patch Transdermal Q72H   ??? fluconazole in NaCl (iso-osm)  200 mg Intravenous Q24H   ??? insulin regular  0-5 Units Subcutaneous Q6H   ??? piperacillin-tazobactam (ZOSYN) IV extended interval  3.375 g Intravenous Q8H   ??? vancomycin  750 mg Intravenous Q24H     Continuous Infusions:  ??? TPN ADULT ([facility])       PRN Meds:ALPRAZolam, alteplase, dextrose, dextrose 50 % in water (D50W), heparin lock flush, HYDROmorphone, ipratropium-albuterol, LORazepam, ondansetron    Objective:    Vital signs in last 24 hours:  Temp:  [97.5 ??F (36.4 ??C)-98.6 ??F (37 ??C)] 97.5 ??F (36.4 ??C)  Heart Rate:  [68-138] 138  Resp:  [16-23] 23  BP: (104-114)/(67-75) 114/67 mmHg    Physical Examination:     GENERAL: The patient is awake, alert, oriented?? not in acute cardiorespiratory distress. ??  HEENT:Atraumatic.Normocephalic. PERRLA, EOMI. No icterus, no pallor, no jugular venous distention. ??  NECK: Supple. Trachea is in midline. No Thyromegaly. No Lymphadenopathy.  CHEST: Symmetric. Clear to auscultation, bilateral equal air entry. No wheezing or rhonchi.  CARDIOVASCULAR: S1, S2, Regular pulse, tachy rate. No murmur or gallops. No JVD.  ABDOMEN: Soft, nontender, nondistended, positive bowel sounds.No viscera palpable.  EXTREMITIES: There is no edema in the lower extremities.??  Pulses are positive in all extremities.Power is 5/5 in all extremities.(+) dry gangrene R foot  ??    Intake/Output last 3 shifts:  I/O last 3 completed shifts:  In: 4283 [P.O.:840; I.V.:1297; IV Piggyback:100]  Out: 250 [Urine:250]    Recent Labs;  @SLAB @  No results found for: INR, PROTIME    ABG;  No results found for: PHART, PCO2, PO2ART, HCO3ART, BEART, HBO2PER, O2SATART        Last  Culture and Gram Stain;  No results found for: AEROBOT, ANABOT, LABGRAM, ISO2, ISO3, ISO4, ISO5, ISO6, PNAFISH      Diet;    Diet Orders          TPN ADULT ([facility]) at 62.5 mL/hr starting at 04/07 1800    Diet NPO effective now Except for: except ice chips, except sips of clears starting at 03/28 1610          Future Appointments:  No future appointments.    Assessment/Plan:      Colon CA   - will discuss with sister, s/p radiation , surgery but no records avail      ?? Abdominal abscess (07/04/2015)  ????????????????????????- continue abx vanc / zosyn?? and diflucan, duration depending on clinical / radiographic improvement.   -  wound care   - increase fentanyl patch, still requiring prn while awake, will wait to increase further      ????????????????????????  ?? 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   Pager# [phone]    07/19/2015

## 2015-07-19 NOTE — Unmapped (Signed)
Problem: Fall Prevention  Goal: Patient will remain free of falls  Assess and monitor vitals signs, neurological status including level of consciousness and orientation. Reassess fall risk per hospital policy.    Ensure arm band on, uncluttered walking paths in room, adequate room lighting, call light and overbed table within reach, bed in low position, wheels locked, side rails up per policy, and non-skid footwear provided.   Outcome: Progressing  Fall precautions maintained, call light within reach, patient utilizes call light to ask for assistance as needed.  Arm band is secure, uncluttered walking pathways and adequate room lighting is maintained.  Call light and bed side table are within reach.  Bed is in low position, wheels locked, side rails per policy.  Non skid footwear is provided. Vitals signs stable. PRN pain meds given with positive effects noted No acute distress noted Tolerating TPN without difficulty, CBG as rx'd, hung and verified with another RN as rx'd IV ATB continues as rx'd without any adverse side effects. IV site WNL flushes with blood return. Wound treatment done per rx, patient tolerated well, skin integrity interventions utilized.

## 2015-07-20 LAB — CBC
Hematocrit: 31.4 % — ABNORMAL LOW (ref 35.0–45.0)
Hemoglobin: 10.3 g/dL — ABNORMAL LOW (ref 11.7–15.5)
MCH: 28.9 pg (ref 27.0–33.0)
MCHC: 32.7 g/dL (ref 32.0–36.0)
MCV: 88.4 fL (ref 80.0–100.0)
MPV: 7.4 fL — ABNORMAL LOW (ref 7.5–11.5)
Platelets: 702 10*3/uL — ABNORMAL HIGH (ref 140–400)
RBC: 3.55 10*6/uL — ABNORMAL LOW (ref 3.80–5.10)
RDW: 18.5 % — ABNORMAL HIGH (ref 11.0–15.0)
WBC: 14.2 10*3/uL — ABNORMAL HIGH (ref 3.8–10.8)

## 2015-07-20 LAB — DIFFERENTIAL
Basophils Absolute: 14 /uL (ref 0–200)
Basophils Relative: 0.1 % (ref 0.0–1.0)
Eosinophils Absolute: 114 /uL (ref 15–500)
Eosinophils Relative: 0.8 % (ref 0.0–8.0)
Lymphocytes Absolute: 4345 /uL — ABNORMAL HIGH (ref 850–3900)
Lymphocytes Relative: 30.6 % (ref 15.0–45.0)
Monocytes Absolute: 710 /uL (ref 200–950)
Monocytes Relative: 5 % (ref 0.0–12.0)
Neutrophils Absolute: 9017 /uL — ABNORMAL HIGH (ref 1500–7800)
Neutrophils Relative: 63.5 % (ref 40.0–80.0)
nRBC: 0 /100{WBCs} (ref 0–0)

## 2015-07-20 LAB — POC GLU MONITORING DEVICE
POC Glucose Monitoring Device: 120 mg/dL (ref 70–100)
POC Glucose Monitoring Device: 109 mg/dL (ref 70–100)
POC Glucose Monitoring Device: 112 mg/dL — ABNORMAL HIGH (ref 70–100)
POC Glucose Monitoring Device: 116 mg/dL (ref 70–100)

## 2015-07-20 MED ORDER — TPN ADULT (WCH/Drake)
3 | INTRAVENOUS | Status: AC
Start: 2015-07-20 — End: 2015-07-21
  Administered 2015-07-20: 22:00:00 via INTRAVENOUS

## 2015-07-20 MED FILL — HYDROMORPHONE 2 MG/ML INJECTION SYRINGE: 2 2 mg/mL | INTRAMUSCULAR | Qty: 2

## 2015-07-20 MED FILL — PIPERACILLIN-TAZOBACTAM 3.375 GRAM INTRAVENOUS SOLUTION: 3.375 3.375 gram | INTRAVENOUS | Qty: 1

## 2015-07-20 MED FILL — ASPIRIN 81 MG CHEWABLE TABLET: 81 81 MG | ORAL | Qty: 1

## 2015-07-20 MED FILL — CLOPIDOGREL 75 MG TABLET: 75 75 mg | ORAL | Qty: 1

## 2015-07-20 MED FILL — TRAVASOL 10 % INTRAVENOUS SOLUTION: 10 10 % | INTRAVENOUS | Qty: 720

## 2015-07-20 MED FILL — FLUCONAZOLE 200 MG/100 ML IN SOD. CHLORIDE (ISO) INTRAVENOUS PIGGYBACK: 200 200 mg/100 mL | INTRAVENOUS | Qty: 100

## 2015-07-20 MED FILL — VANCOMYCIN 1,000 MG INTRAVENOUS INJECTION: 1000 1000 mg | INTRAVENOUS | Qty: 750

## 2015-07-20 NOTE — Unmapped (Signed)
Pt alert and oriented, anxious, refuses Xanax or Ativan as ordereded PRN for anxiety, asks for Dilaudid for generalized pain, 4 mg Dilaudid given as ordered. Short term relief noted. TPN running continuously at 62.5Ml/hr. Abdominal drainage bags patent.

## 2015-07-20 NOTE — Unmapped (Signed)
Problem: Incontinence  Goal: Perineal skin integrity is maintained or improved  Assess genitourinary system, perineal skin, labs (urinalysis), and history of incontinence to include past management, aggravating, and alleviating factors. Collaborate with interdisciplinary team and initiate plans and interventions as needed.   Outcome: Progressing  Pt with foley catheter. All pouches and drains CDI and draining easily.

## 2015-07-20 NOTE — Unmapped (Signed)
Pt with anxiety but refusing anxiolytics. Pt tearful when in pain. Dilaudid given PRN with effect as charted. Pouches/drains CDI. Pt refused to allow this RN to view/ charge dressing on buttocks.

## 2015-07-20 NOTE — Unmapped (Signed)
Problem: Fall Prevention  Goal: Patient will remain free of falls  Assess and monitor vitals signs, neurological status including level of consciousness and orientation. Reassess fall risk per hospital policy.    Ensure arm band on, uncluttered walking paths in room, adequate room lighting, call light and overbed table within reach, bed in low position, wheels locked, side rails up per policy, and non-skid footwear provided.   Outcome: Progressing  .  Intervention: Keep call light within reach  .  Intervention: Keep bed in low position  .  Intervention: Offer toileting every 2 hours  .  Intervention: Fall risk sign in place  .

## 2015-07-20 NOTE — Unmapped (Signed)
Clinical Pharmacy Service - TPN Progress Note    Linda Ponce is a 45 y.o. female followed by the pharmacy department for monitoring and adjustment of TPN    Assessment/Plan:   1) Continue continuous TPN with electrolytes, macronutrients and additives from yesterday  2) No labs today; ordered Monday and Thursday  3) Pharmacy will continue to monitor electrolytes and make necessary adjustments.      Thank you for the consult,  Tillie Fantasia, [facility]   07/20/2015 7:39 AM

## 2015-07-20 NOTE — Unmapped (Signed)
[facility]  DEPARTMENT OF INTERNAL MEDICINE  DAILY PROGRESS NOTE    Chief Complaint / Reason for Follow-Up     Linda Ponce is a 45 y.o. female on hospital day 4.  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    Interval History / Subjective     Subjective  Patient unwilling to take PO medications due to pain  Refusing Xanax or Ativan, asks for Dilaudid IV for severe pain in abdomen and BLE  Unable to get comfortable, complains of severe back pain  Past medical, family, and social histories were reviewed as previously documented. Updates were made as necessary.    Review of Systems     Review of Systems    Medications     Scheduled Meds:  ??? aspirin  81 mg Per NG tube Daily with breakfast   ??? clopidogrel  75 mg Per NG tube Daily 0900   ??? fentaNYL  1 patch Transdermal Q72H   ??? fluconazole in NaCl (iso-osm)  200 mg Intravenous Q24H   ??? insulin regular  0-5 Units Subcutaneous Q6H   ??? piperacillin-tazobactam (ZOSYN) IV extended interval  3.375 g Intravenous Q8H   ??? vancomycin  750 mg Intravenous Q24H     Continuous Infusions:  ??? TPN ADULT ([facility]) 62.5 mL/hr at 07/19/15 1830   ??? TPN ADULT ([facility])       PRN Meds:ALPRAZolam, alteplase, dextrose, dextrose 50 % in water (D50W), heparin lock flush, HYDROmorphone, ipratropium-albuterol, LORazepam, ondansetron    Vital Signs     Temp:  [97.5 ??F (36.4 ??C)-98.5 ??F (36.9 ??C)] 97.6 ??F (36.4 ??C)  Heart Rate:  [118-138] 134  Resp:  [20-24] 20  BP: (107-132)/(67-74) 107/73 mmHg    Intake/Output Summary (Last 24 hours) at 07/20/15 1535  Last data filed at 07/20/15 1000   Gross per 24 hour   Intake   2088 ml   Output    250 ml   Net   1838 ml       Physical Exam     Physical Exam  GENERAL: Cachectic AA female in acute distress from severe pain, diaphoretic, grimacing, awake, alert,?? not in acute cardiorespiratory distress. Entirely oriented  HEENT:Atraumatic.Normocephalic. PERRLA, EOMI. No icterus, no pallor, no jugular venous distention.  NECK: Supple. Trachea is  in midline.?? No bruits, no lymphadenopathy  CHEST: Tunneled internal jugular catheter right side. PAC right chest. Symmetric. Clear to auscultation, bilateral equal air entry. No wheezing or rhonchi.  CARDIOVASCULAR: S1, S2, Regular pulse. No murmur or gallops. No JVD.  ABDOMEN: Soft.?? Unable to palpate due to multiple wounds etc. LLQ pigtail drain draining serosanguinous fluid. Nephrostomy tubes both flanks draining yellow urine. Foley with scant dark urine. Wound LLQ nondraining. What appears to be ostomy vs wound with appliance in place RLQ. Large midabdominal wound pouch to drainage, two fistulae contained within draining bilious appearing enteric fluid.  EXTREMITIES: generalized edema. Extremities warm. Necrotic, gangrenous toes, excruciatingly painful. No clubbing. Unable to assess BLE pulses   Laboratory Data         Lab 07/20/15  0637 07/18/15  0518 07/16/15  0540   WBC 14.2* 13.4* 12.3*   HEMOGLOBIN 10.3* 10.6* 10.9*   HEMATOCRIT 31.4* 31.9* 33.0*   MEAN CORPUSCULAR VOLUME 88.4 87.1 87.4   PLATELETS 702* 682* 503*         Lab 07/18/15  0518 07/16/15  0540 07/15/15  0840 07/15/15  0839   SODIUM 139 140  --  137   POTASSIUM 3.9  4.8  --  SEE COMMENT   CHLORIDE 101 103  --  102   CO2 31 30  --  30   BUN 41* 41*  --  37*   CREATININE 0.78 0.70  --  0.72   GLUCOSE 81 79  --  102*   CALCIUM 7.9* 8.1*  --  8.0*   MAGNESIUM 2.1 2.1 2.5  --    PHOSPHORUS 2.5 4.8* 5.6*  --              Lab 07/18/15  0518 07/16/15  0540 07/15/15  0840   ALT  --   --  38   AST  --   --  93*   ALK PHOS  --   --  126*   BILIRUBIN TOTAL  --   --  0.8   BILIRUBIN DIRECT  --   --  0.07   ALBUMIN 1.6* 1.6* 1.9*      07/05/2015  07/09/2015    Prealbumin 14.0 (L) 16.0 (L)      Ref. Range 07/08/2015 20:05 07/13/2015 04:37 07/18/2015 05:18   Vancomycin Tr Latest Ref Range: 10.0-20.0 ug/mL 20.2 (H) 16.1 17.5         Assessment & Plan     ?? Peritoneal abscess- Mixed flora, pigtail drain 06/26/15 Refused followup CT, still requires one. ID consult-  Vanco/Zosyn/Diflucan. WBC slowly increasing  ?? Fistula- wound care, bowel rest  ?? Severe intractable acute on chronic pain syndrome- receiving Dilaudid IV BID prn, fentanyl patch dose increased  ?? Thrombocytopenia- uncertain etiology- follow closely. Per ID [facility], resolved. May have been heparin related  ?? Cachexia-???? continue TPN, recheck PAB  ?? Sepsis resolved  ?? Ischemic feet/arterial thromboemboli- currently on aspirin and Plavix. Refused all heparin injections previously. Likely will need amputation  ?? History of colon cancer s/p exenteration with bilateral nephrostomy tubes  ?? Recurrent presacral abscesses  ?? Debility- PT/OT      Nutrition:   Diet Orders          TPN ADULT ([facility]) at 62.5 mL/hr starting at 04/08 1800    TPN ADULT ([facility]) at 62.5 mL/hr starting at 04/07 1800    Diet NPO effective now Except for: except ice chips, except sips of clears starting at 03/28 0959          Code Status: Full Code    Signed:  Regina Eck, MD  07/20/2015, 3:35 PM

## 2015-07-21 LAB — POC GLU MONITORING DEVICE
POC Glucose Monitoring Device: 121 mg/dL — ABNORMAL HIGH (ref 70–100)
POC Glucose Monitoring Device: 125 mg/dL (ref 70–100)
POC Glucose Monitoring Device: 105 mg/dL (ref 70–100)
POC Glucose Monitoring Device: 118 mg/dL — ABNORMAL HIGH (ref 70–100)

## 2015-07-21 MED ORDER — TPN ADULT (WCH/Drake)
4 | INTRAMUSCULAR | Status: AC
Start: 2015-07-21 — End: 2015-07-22
  Administered 2015-07-21: 22:00:00 via INTRAVENOUS

## 2015-07-21 MED FILL — TRAVASOL 10 % INTRAVENOUS SOLUTION: 10 10 % | INTRAVENOUS | Qty: 720

## 2015-07-21 MED FILL — HYDROMORPHONE 2 MG/ML INJECTION SYRINGE: 2 2 mg/mL | INTRAMUSCULAR | Qty: 2

## 2015-07-21 MED FILL — CLOPIDOGREL 75 MG TABLET: 75 75 mg | ORAL | Qty: 1

## 2015-07-21 MED FILL — FENTANYL 100 MCG/HR TRANSDERMAL PATCH: 100 100 mcg/hr | TRANSDERMAL | Qty: 1

## 2015-07-21 MED FILL — VANCOMYCIN 1,000 MG INTRAVENOUS INJECTION: 1000 1000 mg | INTRAVENOUS | Qty: 750

## 2015-07-21 MED FILL — FLUCONAZOLE 200 MG/100 ML IN SOD. CHLORIDE (ISO) INTRAVENOUS PIGGYBACK: 200 200 mg/100 mL | INTRAVENOUS | Qty: 100

## 2015-07-21 MED FILL — PIPERACILLIN-TAZOBACTAM 3.375 GRAM INTRAVENOUS SOLUTION: 3.375 3.375 gram | INTRAVENOUS | Qty: 1

## 2015-07-21 MED FILL — ASPIRIN 81 MG CHEWABLE TABLET: 81 81 MG | ORAL | Qty: 1

## 2015-07-21 NOTE — Unmapped (Signed)
Pt anxious, refuses daily care, does not get adequate sleep, states she is afraid of sleeping, she doesn't want to die, benefits of sleeping explainned  Pt needs re teaching and reinforcement. Coccyx wound dressing and RLQ colostomy changed. PRN Dilaudid given as ordered for pain, pt refuses Xanax or Ativan as ordered stating that it makes her sleep for 3 days. TPN running at 62.5 Ml/hr.

## 2015-07-21 NOTE — Unmapped (Signed)
Problem: Daily Care  Goal: Daily care needs are met  Assess and monitor ability to perform self care and identify potential discharge needs.   Outcome: Not Progressing  .  Intervention: Assess skin integrity/risk for skin breakdown and implement skin integrity plan of care and interventions per policy  .  Intervention: Assist with ADLs as needed  .

## 2015-07-21 NOTE — Unmapped (Signed)
Patient alert and oriented x4 upon assessment this evening.  Speech is clear, and patient appears calm.  No c/o cough or sore throat, and lung sounds are diminished. Vitals are within stable.  Patient has c/o pain and generalized discomfort with prn pain medication administration which was effective, and no s/s of distress are evident at this time.  Bed mobility and ADL care are performed with staff assistance and without complication. Patient takes IV medications well.  Over bed table and call light is within reach.  Side rails are up x2. TPN infusing at 62.5 ml/hr and IV antibiotics infusing both without complications, will continue to monitor.

## 2015-07-21 NOTE — Unmapped (Signed)
AOx4.  Medicated PRN for pain with effect as charted. Denies nausea, SOB, dizziness, palpitations. Drainage pouches CDI. Anxious, but refusing anxiety medication. Denies stomach discomfort with plavix.     jewish 07/05/15 dx: sepsis    Patient Active Problem List    Diagnosis Date Noted   ??? 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 Malnutrition 07/08/2015   ??? Fistula 07/08/2015   ??? Abdominal abscess 07/04/2015       Filed Vitals:    07/21/15 0500 07/21/15 0700 07/21/15 1200 07/21/15 1803   BP: 104/63  118/69 120/68   Pulse: 126  122 120   Temp: 98 ??F (36.7 ??C)  98.4 ??F (36.9 ??C) 98.3 ??F (36.8 ??C)   TempSrc: Oral  Oral Oral   Resp: 18  18 20    Height:       Weight:  94 lb 4 oz (42.752 kg)     SpO2: 98%  100% 100%       Does the patient's have any transfers/appointments or testing scheduled -   No future appointments.

## 2015-07-21 NOTE — Unmapped (Signed)
[place]  DEPARTMENT OF INTERNAL MEDICINE  DAILY PROGRESS NOTE    Chief Complaint / Reason for Follow-Up     Michaele Amundson is a 45 y.o. female on hospital day 73.  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    Interval History / Subjective     Subjective  Patient unwilling to take PO medications due to pain, refusing care  Stopped Aspirin as patient refuses, also refusing Plavix but I feel we need to continue to offer it to her  Regularly asking Dilaudid IV for severe pain in abdomen and BLE     Past medical, family, and social histories were reviewed as previously documented. Updates were made as necessary.    Review of Systems     Review of Systems    Medications     Scheduled Meds:  ??? aspirin  81 mg Per NG tube Daily with breakfast   ??? clopidogrel  75 mg Per NG tube Daily 0900   ??? fentaNYL  1 patch Transdermal Q72H   ??? fluconazole in NaCl (iso-osm)  200 mg Intravenous Q24H   ??? insulin regular  0-5 Units Subcutaneous Q6H   ??? piperacillin-tazobactam (ZOSYN) IV extended interval  3.375 g Intravenous Q8H   ??? vancomycin  750 mg Intravenous Q24H     Continuous Infusions:  ??? TPN ADULT ([facility]) 62.5 mL/hr at 07/20/15 1804   ??? TPN ADULT ([facility])       PRN Meds:ALPRAZolam, alteplase, dextrose, dextrose 50 % in water (D50W), heparin lock flush, HYDROmorphone, ipratropium-albuterol, LORazepam, ondansetron    Vital Signs     Temp:  [97.6 ??F (36.4 ??C)-99.1 ??F (37.3 ??C)] 98 ??F (36.7 ??C)  Heart Rate:  [126-135] 126  Resp:  [18-20] 18  BP: (104-115)/(63-80) 104/63 mmHg    Intake/Output Summary (Last 24 hours) at 07/21/15 0738  Last data filed at 07/20/15 2239   Gross per 24 hour   Intake 1770.4 ml   Output    300 ml   Net 1470.4 ml       Physical Exam     Physical Exam  GENERAL: Cachectic AA female in acute distress from severe pain, diaphoretic, grimacing, awake, alert,?? not in acute cardiorespiratory distress. Entirely oriented  HEENT:Atraumatic.Normocephalic. PERRLA, EOMI. No icterus, no pallor, no  jugular venous distention.  NECK: Supple. Trachea is in midline.?? No bruits, no lymphadenopathy  CHEST: Tunneled internal jugular catheter right side. PAC right chest. Symmetric. Clear to auscultation, bilateral equal air entry. No wheezing or rhonchi.  CARDIOVASCULAR: S1, S2, Regular pulse. No murmur or gallops. No JVD.  ABDOMEN: Soft.?? Unable to palpate due to multiple wounds etc. LLQ pigtail drain draining serosanguinous fluid. Nephrostomy tubes both flanks draining yellow urine. Foley with scant dark urine. Wound LLQ nondraining. What appears to be ostomy vs wound with appliance in place RLQ. Large midabdominal wound pouch to drainage, two fistulae contained within draining bilious appearing enteric fluid.  EXTREMITIES: generalized edema. Extremities warm. Necrotic, gangrenous toes, excruciatingly painful. No clubbing. Unable to assess BLE pulses   Laboratory Data         Lab 07/20/15  0637 07/18/15  0518 07/16/15  0540   WBC 14.2* 13.4* 12.3*   HEMOGLOBIN 10.3* 10.6* 10.9*   HEMATOCRIT 31.4* 31.9* 33.0*   MEAN CORPUSCULAR VOLUME 88.4 87.1 87.4   PLATELETS 702* 682* 503*         Lab 07/18/15  0518 07/16/15  0540 07/15/15  0840 07/15/15  0839   SODIUM 139 140  --  137   POTASSIUM 3.9 4.8  --  SEE COMMENT   CHLORIDE 101 103  --  102   CO2 31 30  --  30   BUN 41* 41*  --  37*   CREATININE 0.78 0.70  --  0.72   GLUCOSE 81 79  --  102*   CALCIUM 7.9* 8.1*  --  8.0*   MAGNESIUM 2.1 2.1 2.5  --    PHOSPHORUS 2.5 4.8* 5.6*  --              Lab 07/18/15  0518 07/16/15  0540 07/15/15  0840   ALT  --   --  38   AST  --   --  93*   ALK PHOS  --   --  126*   BILIRUBIN TOTAL  --   --  0.8   BILIRUBIN DIRECT  --   --  0.07   ALBUMIN 1.6* 1.6* 1.9*      07/05/2015  07/09/2015    Prealbumin 14.0 (L) 16.0 (L)      Ref. Range 07/08/2015 20:05 07/13/2015 04:37 07/18/2015 05:18   Vancomycin Tr Latest Ref Range: 10.0-20.0 ug/mL 20.2 (H) 16.1 17.5         Assessment & Plan     ?? Peritoneal abscess- Mixed flora, pigtail drain 06/26/15 Refused  followup CT, still requires one. ID consult- Vanco/Zosyn/Diflucan. WBC slowly increasing, recheck tomorrow, tmax 99.1  ?? Fistula- wound care, bowel rest  ?? Severe intractable acute on chronic pain syndrome- receiving Dilaudid IV BID prn, fentanyl patch dose increased  ?? Thrombocytopenia- uncertain etiology- follow closely. Per ID at [facility], resolved. May have been heparin related  ?? Cachexia-???? continue TPN, recheck PAB  ?? Sepsis resolved  ?? Ischemic feet/arterial thromboemboli- refusing heparin, aspirin and Plavix. Refused all heparin injections previously as well. Will cancel aspirin but continue to encourage plavix.. Likely will need amputation  ?? History of colon cancer s/p exenteration with bilateral nephrostomy tubes  ?? Recurrent presacral abscesses  ?? Debility- PT/OT      Nutrition:   Diet Orders          TPN ADULT ([facility]) at 62.5 mL/hr starting at 04/09 1800    TPN ADULT ([facility]) at 62.5 mL/hr starting at 04/08 1800    Diet NPO effective now Except for: except ice chips, except sips of clears starting at 03/28 0959          Code Status: Full Code    Signed:  Regina Eck, MD  07/21/2015, 7:38 AM

## 2015-07-21 NOTE — Unmapped (Signed)
Problem: Potential for Compromised Skin Integrity  Goal: Skin integrity is maintained or improved  Assess and monitor skin integrity. Identify patients at risk for skin breakdown on admission and per policy. Collaborate with interdisciplinary team and initiate plans and interventions as needed.   Outcome: Progressing  Avoid shearing, keep skin clean and dry, turn patient, relieve pressure to bony prominences, and encourage use of moisturizers on skin.

## 2015-07-21 NOTE — Unmapped (Signed)
Spoke to Ault MD by phone. Pt refusing plavix/ aspirin, now states burning discomfort when she takes them. Per Lovey Newcomer MD, DC aspirin but continue to encourage plavix 2/2 foot gangrene.

## 2015-07-22 LAB — RENAL FUNCTION PANEL W/EGFR
Albumin: 1.6 g/dL — ABNORMAL LOW (ref 3.5–5.7)
Anion Gap: 7 mmol/L (ref 3–16)
BUN: 44 mg/dL — ABNORMAL HIGH (ref 7–25)
CO2: 27 mmol/L (ref 21–33)
Calcium: 8.2 mg/dL — ABNORMAL LOW (ref 8.6–10.3)
Chloride: 105 mmol/L (ref 98–110)
Creatinine: 0.79 mg/dL (ref 0.60–1.30)
Glucose: 78 mg/dL (ref 70–100)
Osmolality, Calculated: 298 mosm/kg (ref 278–305)
Phosphorus: 4.7 mg/dL (ref 2.1–4.7)
Potassium: 4.7 mmol/L (ref 3.5–5.3)
Sodium: 139 mmol/L (ref 133–146)
eGFR AA CKD-EPI: >90 See note.
eGFR NONAA CKD-EPI: >90 See note.

## 2015-07-22 LAB — VANCOMYCIN, TROUGH: Vancomycin Tr: 11.4 ug/mL (ref 10.0–20.0)

## 2015-07-22 LAB — HEPATIC FUNCTION PANEL
ALT: 29 U/L (ref 7–52)
AST: 22 U/L (ref 13–39)
Albumin: 1.6 g/dL (ref 3.5–5.7)
Alkaline Phosphatase: 166 U/L (ref 36–125)
Bilirubin, Direct: 0.07 mg/dL (ref 0.00–0.40)
Bilirubin, Indirect: 0.23 mg/dL (ref 0.00–1.10)
Total Bilirubin: 0.3 mg/dL (ref 0.0–1.5)
Total Protein: 5.1 g/dL (ref 6.4–8.9)

## 2015-07-22 LAB — MAGNESIUM: Magnesium: 2.3 mg/dL (ref 1.5–2.5)

## 2015-07-22 LAB — CBC
Hematocrit: 59.6 % (ref 35.0–45.0)
Hemoglobin: 19.1 g/dL (ref 11.7–15.5)
MCH: 28.8 pg (ref 27.0–33.0)
MCHC: 32.1 g/dL (ref 32.0–36.0)
MCV: 89.8 fL (ref 80.0–100.0)
MPV: 7.2 fL (ref 7.5–11.5)
Platelets: 269 10*3/uL (ref 140–400)
RBC: 6.63 10*6/uL (ref 3.80–5.10)
RDW: 20 % (ref 11.0–15.0)
WBC: 6.6 10*3/uL (ref 3.8–10.8)

## 2015-07-22 LAB — TRIGLYCERIDES: Triglycerides: 125 mg/dL (ref 10–149)

## 2015-07-22 LAB — POC GLU MONITORING DEVICE
POC Glucose Monitoring Device: 131 mg/dL — ABNORMAL HIGH (ref 70–100)
POC Glucose Monitoring Device: 76 mg/dL (ref 70–100)
POC Glucose Monitoring Device: 110 mg/dL (ref 70–100)
POC Glucose Monitoring Device: 105 mg/dL (ref 70–100)

## 2015-07-22 MED ORDER — HYDROmorphone (DILAUDID) injection Syrg 4 mg
2 | INTRAMUSCULAR | Status: AC | PRN
Start: 2015-07-22 — End: 2015-08-28
  Administered 2015-07-23 – 2015-08-28 (×262): 4 mg via INTRAVENOUS

## 2015-07-22 MED ORDER — fentaNYL (DURAGESIC) 25 mcg/hr 1 patch
25 | TRANSDERMAL | Status: AC
Start: 2015-07-22 — End: 2015-08-07
  Administered 2015-07-23 – 2015-08-07 (×6): 1 via TRANSDERMAL

## 2015-07-22 MED ORDER — TPN ADULT (WCH/Drake)
10 | INTRAVENOUS | Status: AC
Start: 2015-07-22 — End: 2015-07-23
  Administered 2015-07-22: 22:00:00 via INTRAVENOUS

## 2015-07-22 MED FILL — HYDROMORPHONE 2 MG/ML INJECTION SYRINGE: 2 2 mg/mL | INTRAMUSCULAR | Qty: 2

## 2015-07-22 MED FILL — PIPERACILLIN-TAZOBACTAM 3.375 GRAM INTRAVENOUS SOLUTION: 3.375 3.375 gram | INTRAVENOUS | Qty: 1

## 2015-07-22 MED FILL — TRAVASOL 10 % INTRAVENOUS SOLUTION: 10 10 % | INTRAVENOUS | Qty: 720

## 2015-07-22 MED FILL — FLUCONAZOLE 200 MG/100 ML IN SOD. CHLORIDE (ISO) INTRAVENOUS PIGGYBACK: 200 200 mg/100 mL | INTRAVENOUS | Qty: 100

## 2015-07-22 MED FILL — VANCOMYCIN 1,000 MG INTRAVENOUS INJECTION: 1000 1000 mg | INTRAVENOUS | Qty: 750

## 2015-07-22 MED FILL — CLOPIDOGREL 75 MG TABLET: 75 75 mg | ORAL | Qty: 1

## 2015-07-22 MED FILL — FENTANYL 25 MCG/HR TRANSDERMAL PATCH: 25 25 mcg/hr | TRANSDERMAL | Qty: 1

## 2015-07-22 NOTE — Unmapped (Signed)
Problem: Knowledge Deficit  Goal: Patient/family/caregiver demonstrates understanding of disease process, treatment plan, medications, and discharge instructions  Complete learning assessment and assess knowledge base.   Intro to ltac

## 2015-07-22 NOTE — Unmapped (Signed)
[place]    [facility]   Wound Care Assessment  07/22/2015       Linda [facility] is an 45 y.o. female.  DOB: 01/29/1971    Pressure ulcers on admission:  One STAGE: III on coccyx              One UNSTAGEABLE on right lateral hip    Diagnosis: jewish 07/05/15 dx: sepsis    Allergies   Allergen Reactions   ??? Compazine [Prochlorperazine Edisylate]    ??? Latex, Natural Rubber        History:    Past Medical History   Diagnosis Date   ??? Cancer    ??? Abscess of abdominal cavity    ??? Gangrene of foot    ??? AKI (acute kidney injury)    ??? Fistula    ??? Peritonitis and retroperitoneal infections    ??? Cachexia    ??? Hyperkalemia    ??? Dehydration    ??? Encephalopathies    ??? Ischemic foot      Past Surgical History   Procedure Laterality Date   ??? Colon surgery       Social History   Substance Use Topics   ??? Smoking status: Never Smoker    ??? Smokeless tobacco: None   ??? Alcohol Use: None     Present admission history of illness/MD note.  63f with history of colorectal CA, initially admitted to [facility] post operatively for continued wound care. Developed bleeding, sent back to [facility] where she was foundto have multiple pelvic abscesses, enterocutaneous fistulas, was kept NPO, started on IV abx, TPN, and returns for continued care.    Skin/Wound Care Assessment.      Braden: 14     Pressure wounds:    Coccyx STAGE:   III    Measures:  L 0.7 cm X W 0.7 cm X D  0.1 cm.   Wounds progress:  Improving  Wound Assessment:  open area of moist pink tissue, now very superficial tissue loss, no drainage, no odor noted. Edges flat, attached. Periwound skin intact.   Treatment/Dressing: Clean with normal saline, pat dry. Apply Medihoney and cover with Mepilex border. Change every other day and prn dressing failure.    *Dressing was changed on night shift, remains C/D/I, peeled back to assess wound, did not apply new dressing per pt request    _____________________________________________________________________   Pressure wounds:    Right lateral hip  STAGE: Unstagable      Wounds progress:  RESOLVED  Wound Assessment:  Epithelialized, told pt she may moisturize this area daily as pt reports it is dry/itchy    _____________________________________________________________________  Surgical wounds:????????????????????????????????????????????????????????????    Mid Abdominal Wound with fistula???? Full Thickness. Measurements from 4/3: 8.7 cm L x 5 cm W x 1.5 cm D, has tunnel at 3 oclock that appears to tunnel over to wound/fistula on left abdomen   Wounds progress:????UTA  Wound Assessment:??Pouch clean/dry/intact. Unable to clearly visualize wound bed. Moderate amount liquid gold effluent draining into foley bag.  Visible periwound skin intact. Pt reports this pouch has been intact since WCT changed last week.    Treatment/Dressing:  Clean wounds and surrounding skin with soap and water, pat dry. Attach Eakins pouch using Eakins rings to help with adherence. Use large pouch Eakins # [id number] for mid abdomen and smaller pouch [phone] for left abdomen. Change pouches weekly and prn for leakages.    ____________________________________________________________________  Surgical wounds:??????  ??????  Left Lateral Abdomen  Fistula???? Full Thickness. Measurements from 4/3: L 5.5?? cm X W 1.5 cm X?? D 1.5 cm, has tunnel at 9 oclock that appears to tunnel over to larger wound/fistula of mid abdomen  ??  Wounds progress:??UTA  Wound Assessment:??Pouch clean and dry, mostly intact but starting to peel up along medial edges. Unable to clearly visualize wound bed. Moderate amount liquid gold effluent draining into foley bag. Visible periwound skin intact. Pt reports this pouch has been intact since WCT changed last week.    Treatment/Dressing: Mid abdominal and left abdominal fistula/wounds being managed with Eakins pouches attached to gravity drainage bags. Clean wounds and surrounding skin with soap and water, pat dry. Attach Eakins pouch using Eakins rings to help with adherence. Use large pouch Eakins # [id number] for mid  abdomen and smaller pouch [phone] for left abdomen. Change pouches weekly and prn for leakages.    *Suggested to pt reinforcing pouch or changing, as pouch is starting to lose seal along medial edge. Pt initially agreeable to pouch change, but then became very anxious and upset, and refused pouch change. Pt reports her bed is a mess, she is uncomfortable, and her mother and son will be here at any time. Attempted to calm pt and reposition her, but pt refused, called her aide [place] to bedside per pt request.     _____________________________________________________________________  Surgical wounds:????  ????????  Drain of left groin ?? ??  Wounds progress:????UTA ??  Wound Assessment: Pouch clean/dry/intact. Unable to visualize drain. No drainage in pouch.  Treatment/Dressing: Drain to left groin is open, apply small Hollister pouch [phone] to this, change pouch weekly and prn dressing failure.    _____________________________________________________________________  Surgical wounds:??????  ??????  Bilateral nephrostomy drains  Wounds progress:??UTA  Wound Assessment: Dressings clean/dry/intact, bilateral tubes to closed system drainage bag, gold liquid in drainage bags, pt declined dressing change as these were just completed on night shift  Treatment/Dressing: Bilateral nephrostomy tubes both to drainage pouches. Dressing changes every 3 days and prn for dressing failure. Clean sites with CHG, allow to dry. Apply AMD slit drain gauze to site, cover with Tegaderm. Position tubing so that patient is not lying on tubing.Drain to left groin is currently capped, apply small Hollister pouch [phone] to this, change  pouch weekly and prn dressing failure.    _____________________________________________________________________  Vascular Wounds on Right Foot????????    Wounds progress:????no significant change ??  Wound Assessment:?? Dry black eschar to anterior and posterior portions of 1st through 5th toes, great toenail has fallen off, no open  areas. Eschar has not extended beyond marked line. Has positive pedal pulses.   Treatment/Dressing: betadine paint daily, LOTA    *Pt has follow-up with vascular, Dr. Luan Pulling, tomorrow at 9:45am    _____________________________________________________________________  Surgical wounds:    Colostomy/mucsous fistula ????RLQ  Wound Progress:  UTA  Wound Assessment:??Pouch clean/dry/intact, able to see moist pink round raised stoma through pouch. Visible peri-wound is intact. Small amount of liquid brown stool in pouch.  Wound Treatment: Right lower quadrant colostomy currently only functioning as mucous fistula. Keep covered with small ostomy pouch Hollister # [id number]. Change weekly and prn dressing failure.       ______________________________________________________________    Treatment time: 2 nurses approximately 1 hour ????????????????????????    Preventative measures.    Off loading and pressure relief as measures are using an accumax mattress, Turning Wedge, pillows, and waffle boots to aid in off load of pressure points.  Other measures include lotion's and cream's  to Hydrate and moisturize dry skin.  Moisture barrier cream [employer] clear is provided to protect groin and peri anal area from incontinence. Currently has foley catheter with statlock in place.         Labs:   Lab Results   Component Value Date    GLUCOSE 78 07/22/2015    BUN 44* 07/22/2015    CO2 27 07/22/2015    CREATININE 0.79 07/22/2015    K 4.7 07/22/2015    NA 139 07/22/2015    CL 105 07/22/2015    CALCIUM 8.2* 07/22/2015     Lab Results   Component Value Date    WBC 6.6 07/22/2015    HGB 19.1* 07/22/2015    HCT 59.6* 07/22/2015    MCV 89.8 07/22/2015    PLT 269 07/22/2015     No results found for: PTT, INR  Lab Results   Component Value Date    PREALBUMIN 16.0* 07/09/2015     Lab Results   Component Value Date    ALBUMIN 1.6* 07/22/2015    ALBUMIN 1.6* 07/22/2015     No results found for: ESR, CRP

## 2015-07-22 NOTE — Unmapped (Signed)
Physical Therapy   Reason Patient Not Seen         Name: Linda Ponce  DOB: 11-15-1970  Attending Physician: Corrinne Eagle, MD  Admission Diagnosis: jewish 07/05/15 dx: sepsis  Date: 07/22/2015    Reviewed Pertinent hospital course: Yes    Unable to see patient due to :Patient unwilling to participate Attempted tx at 11:42am. Pt politely declines stating, why can't anything be done on my own terms? Pt appears frustrated and anxious that her mother and son and are coming to visit without letting her know in advance. Pt states she would like to do therapy tomorrow when no one else could be around. Plan to cont with POC on next visit and will attempt tomorrow afternoon after pt's appointment. Pt is aware and demo verbal understanding.     Rulon Abide, PT, DPT [phone]

## 2015-07-22 NOTE — Unmapped (Signed)
Hospitalist Progress Note    07/22/2015     8:58 PM    Linda Ponce   LOS: 14 days       HPI;    Principal Problem:    Fistula  Active Problems:    Abdominal abscess    Enterocutaneous fistula    Malnutrition      Subjective & Interval History;    Afebrile, hemodynamically stable, continues to complain of pain level of 9 improved to a 7 allowing Linda Ponce to rest. Appears to be in no distress, awake, alert.    Review of Systems:  As above otherwise negative         PMH: Unchanged from H&P.   PSH: Unchanged from H&P.   Family medical history: Unchanged from H&P.   Social history: Unchanged from H&P      Scheduled Meds:  ??? clopidogrel  75 mg Per NG tube Daily 0900   ??? fentaNYL  1 patch Transdermal Q72H   ??? fentaNYL  1 patch Transdermal Q72H   ??? fluconazole in NaCl (iso-osm)  200 mg Intravenous Q24H   ??? insulin regular  0-5 Units Subcutaneous Q6H   ??? piperacillin-tazobactam (ZOSYN) IV extended interval  3.375 g Intravenous Q8H   ??? vancomycin  750 mg Intravenous Q24H     Continuous Infusions:  ??? TPN ADULT ([facility]) 62.5 mL/hr at 07/22/15 1809     PRN Meds:ALPRAZolam, alteplase, dextrose, dextrose 50 % in water (D50W), heparin lock flush, HYDROmorphone, ipratropium-albuterol, LORazepam, ondansetron    Objective:    Vital signs in last 24 hours:  Temp:  [97.7 ??F (36.5 ??C)-98.5 ??F (36.9 ??C)] 97.7 ??F (36.5 ??C)  Heart Rate:  [67-120] 67  Resp:  [18-22] 18  BP: (106-125)/(65-78) 125/78 mmHg    Physical Examination:     GENERAL: The patient is awake, alert, oriented?? not in acute cardiorespiratory distress. ??  HEENT:Atraumatic.Normocephalic. PERRLA, EOMI. No icterus, no pallor, no jugular venous distention. ??  NECK: Supple. Trachea is in midline. No Thyromegaly. No Lymphadenopathy.  CHEST: Symmetric. Clear to auscultation, bilateral equal air entry. No wheezing or rhonchi.  CARDIOVASCULAR: S1, S2, Regular pulse, tachy rate. No murmur or gallops. No JVD.  ABDOMEN: Soft, nontender, nondistended, positive bowel sounds.No viscera  palpable.  EXTREMITIES: There is no edema in the lower extremities.?? Pulses are positive in all extremities.Power is 5/5 in all extremities.(+) dry gangrene R foot  ??    Intake/Output last 3 shifts:  I/O last 3 completed shifts:  In: 5024 [P.O.:1800; I.V.:1613; Other:737; IV Piggyback:250]  Out: 2230 [Urine:2225; Stool:5]    Recent Labs;  @SLAB @  No results found for: INR, PROTIME    ABG;  No results found for: PHART, PCO2, PO2ART, HCO3ART, BEART, HBO2PER, O2SATART        Last  Culture and Gram Stain;  No results found for: AEROBOT, ANABOT, LABGRAM, ISO2, ISO3, ISO4, ISO5, ISO6, PNAFISH      Diet;    Diet Orders          TPN ADULT ([facility]) at 62.5 mL/hr starting at 04/10 1800    Diet NPO effective now Except for: except ice chips, except sips of clears starting at 03/28 0959          Future Appointments:  No future appointments.    Assessment/Plan:      Colon CA   - will discuss with sister, s/p radiation , surgery but no records avail      ?? Abdominal abscess (07/04/2015)  ????????????????????????- continue abx vanc /  zosyn?? and diflucan, duration depending on clinical / radiographic improvement.   - wound care   - increase fentanyl patch to 125 mgc/hr total         ????????????????????????  ?? 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   Pager# [phone]    07/22/2015

## 2015-07-22 NOTE — Unmapped (Signed)
Clinical Pharmacy Note - TPN Order    Linda Ponce is a 45 y.o. female that is being followed by the clinical pharmacy department for monitoring and adjustment of TPN.    Patient is NPO with ice chips  Triglycerides WNL at 125.      Plan:    -adjusted Calcium 10.12; WNL  -Magnesium 2.3; will reduce magnesium to 14 mEq  -Phosphorus increased to 4.7; reduce potassium phosphate to 15 mM  (current potassium level WNL so will not supplement potassium loss by decrease)  -continue current continuous TPN with above changes  -current formulation as follows:    ????  TPN Electrolytes  ??Sodium Chloride: 80 mEq  ??Sodium Acetate: 0 mEq  ??Sodium Phosphate: 0 mmol  ??Magnesium Sulfate: 14 mEq  ??Calcium Gluconate: 0 mEq  ??Potassium Chloride: 20 mEq  ??Potassium Acetate: 0 mEq  ??Potassium Phosphate: 15 mmol?? TPN Macronutrients  ??Volume: 1500 mL  ??Rate: 62.5 mL/hr  ??Amino Acids: 48 g/L ??  ??Dextrose: 192 g/L ??  ??Lipids: 10 g/L ?? TPN Additives  ??MVI 10 mL  ??TE 1 mL  ??Ascorbic Acid 200mg  ??                        SODIUM   Date Value Ref Range Status   07/22/2015 139 133 - 146 mmol/L Final     CHLORIDE   Date Value Ref Range Status   07/22/2015 105 98 - 110 mmol/L Final     CO2   Date Value Ref Range Status   07/22/2015 27 21 - 33 mmol/L Final     POTASSIUM   Date Value Ref Range Status   07/22/2015 4.7 3.5 - 5.3 mmol/L Final     PHOSPHORUS   Date Value Ref Range Status   07/22/2015 4.7 2.1 - 4.7 mg/dL Final     CALCIUM   Date Value Ref Range Status   07/22/2015 8.2* 8.6 - 10.3 mg/dL Final     GLUCOSE   Date Value Ref Range Status   07/22/2015 78 70 - 100 mg/dL Final       Isaiha Asare E Koltyn Kelsay RPH 07/22/2015 10:59 AM

## 2015-07-22 NOTE — Unmapped (Signed)
Clinical Pharmacy Note    Linda Ponce is a 45 y.o. female that is being followed by the clinical pharmacy department for monitoring and adjustment of vancomycin.    Indication:?? Pelvic abscess  Goal trough: 10-15 mg/L per Care Everywhere  Stop date:?? TBD-not specified per order  Other antibiotics: Fluconazole IV, Piperacillin-tazobactam IV    Patient afebrile, renals remaining stable; eGFR > 90  Patient weight 43 kg, estimated CrCL 61.55 ml/min based on SCr 0.8 mg/dL      Plan:     -Vancomycin trough therapeutic  -continue Vancomycin 750 mg IV q24h  -next scheduled trough 4/14 at 0830  -per discharge summary (3/27) per previous [facility] facility antibiotics were to continue at least two weeks from discharge making possible stop date today.  Please assess and consider ordering stop dates          ????  CREATININE??   Date?? Value?? Ref Range?? Status??   07/22/2015?? 0.79?? 0.60 - 1.30 mg/dL?? Final??   07/18/2015?? 0.78?? 0.60 - 1.30 mg/dL?? Final??   07/16/2015?? 0.70?? 0.60 - 1.30 mg/dL?? Final??   07/15/2015?? 0.72?? 0.60 - 1.30 mg/dL?? Final??   07/11/2015?? 0.52*?? 0.60 - 1.30 mg/dL?? Final??   07/09/2015?? 0.49*?? 0.60 - 1.30 mg/dL?? Final??   ????  WBC??   Date?? Value?? Ref Range?? Status??   07/22/2015?? 6.6?? 3.8 - 10.8 10E3/uL?? Final??   07/20/2015?? 14.2*?? 3.8 - 10.8 10E3/uL?? Final??   07/18/2015?? 13.4*?? 3.8 - 10.8 10E3/uL?? Final??   07/16/2015?? 12.3*?? 3.8 - 10.8 10E3/uL?? Final??      ????  VANCOMYCIN TR??   Date?? Value?? Ref Range?? Status??   07/22/2015?? 11.4?? 10.0 - 20.0 ug/mL?? Final??            Thank you.    Rokia Bosket E Ruford Dudzinski [facility] 07/22/2015 11:47 AM

## 2015-07-22 NOTE — Unmapped (Signed)
Assessment completed call liht withun reach family here continues atb tpn wound care done

## 2015-07-22 NOTE — Progress Notes (Signed)
Spoke with nurse at [facility], requested them to send dressing pouch for abdominal wound since we do not carry those. Per nurse will send with pt.

## 2015-07-23 ENCOUNTER — Inpatient Hospital Stay: Admit: 2015-07-23 | Discharge: 2015-08-06 | Payer: MEDICARE | Attending: Surgery | Primary: Internal Medicine

## 2015-07-23 LAB — POC GLU MONITORING DEVICE
POC Glucose Monitoring Device: 115 mg/dL — ABNORMAL HIGH (ref 70–100)
POC Glucose Monitoring Device: 115 mg/dL (ref 70–100)
POC Glucose Monitoring Device: 119 mg/dL (ref 70–100)
POC Glucose Monitoring Device: 117 mg/dL (ref 70–100)

## 2015-07-23 MED ORDER — vancomycin (VANCOCIN) 1,000 mg in sodium chloride 0.9 % 250 mL IVPB
INTRAVENOUS | Status: AC
Start: 2015-07-23 — End: 2015-08-05
  Administered 2015-07-24 – 2015-08-05 (×13): 1000 mg via INTRAVENOUS

## 2015-07-23 MED ORDER — TPN ADULT (WCH/Drake)
3 | INTRAVENOUS | Status: AC
Start: 2015-07-23 — End: 2015-07-24
  Administered 2015-07-23: 22:00:00 via INTRAVENOUS

## 2015-07-23 MED FILL — HYDROMORPHONE 2 MG/ML INJECTION SYRINGE: 2 2 mg/mL | INTRAMUSCULAR | Qty: 2

## 2015-07-23 MED FILL — FLUCONAZOLE 200 MG/100 ML IN SOD. CHLORIDE (ISO) INTRAVENOUS PIGGYBACK: 200 200 mg/100 mL | INTRAVENOUS | Qty: 100

## 2015-07-23 MED FILL — CLOPIDOGREL 75 MG TABLET: 75 75 mg | ORAL | Qty: 1

## 2015-07-23 MED FILL — PIPERACILLIN-TAZOBACTAM 3.375 GRAM INTRAVENOUS SOLUTION: 3.375 3.375 gram | INTRAVENOUS | Qty: 1

## 2015-07-23 MED FILL — TRAVASOL 10 % INTRAVENOUS SOLUTION: 10 10 % | INTRAVENOUS | Qty: 720

## 2015-07-23 MED FILL — VANCOMYCIN 1,000 MG INTRAVENOUS INJECTION: 1000 1000 mg | INTRAVENOUS | Qty: 750

## 2015-07-23 MED FILL — VANCOMYCIN 1,000 MG INTRAVENOUS INJECTION: 1000 1000 mg | INTRAVENOUS | Qty: 1000

## 2015-07-23 NOTE — Unmapped (Signed)
Problem: Fall Prevention  Goal: Patient will remain free of falls  Assess and monitor vitals signs, neurological status including level of consciousness and orientation. Reassess fall risk per hospital policy.    Ensure arm band on, uncluttered walking paths in room, adequate room lighting, call light and overbed table within reach, bed in low position, wheels locked, side rails up per policy, and non-skid footwear provided.   Outcome: Progressing

## 2015-07-23 NOTE — Unmapped (Signed)
Clinical Pharmacy Note    Linda Ponce is a 45 y.o. female that is being followed by the clinical pharmacy department for monitoring and adjustment of vancomycin.     Indication:?? Pelvic abscess  Goal trough: 10-15 mg/L per Care Everywhere  Stop date:?? TBD-not specified per order  Other antibiotics: Fluconazole IV, Piperacillin-tazobactam IV    Estimated CrCL 61.55 ml/min (based on SCr 0.8 mg/dL)    Plan:      -reevaluated Vancomycin trough from yesterday-trough actually drawn at least two hrs prior to scheduled trough time;  actual trough 30 minutes prior to scheduled dose probably < 10  -increase Vancomycin to 1 gm IV q24h starting tomorrow  -next trough scheduled 4/15 at 0830  -per discharge summary (3/27) per previous [facility] facility antibiotics were to continue at least two weeks from discharge making possible stop date 4/10.?? Please assess and consider ordering stop dates        CREATININE   Date Value Ref Range Status   07/22/2015 0.79 0.60 - 1.30 mg/dL Final   16/01/9603 5.40 0.60 - 1.30 mg/dL Final   98/02/9146 8.29 0.60 - 1.30 mg/dL Final   56/21/3086 5.78 0.60 - 1.30 mg/dL Final   46/96/2952 8.41* 0.60 - 1.30 mg/dL Final   32/44/0102 7.25* 0.60 - 1.30 mg/dL Final     WBC   Date Value Ref Range Status   07/22/2015 6.6 3.8 - 10.8 10E3/uL Final   07/20/2015 14.2* 3.8 - 10.8 10E3/uL Final   07/18/2015 13.4* 3.8 - 10.8 10E3/uL Final   07/16/2015 12.3* 3.8 - 10.8 10E3/uL Final        VANCOMYCIN TR   Date Value Ref Range Status   07/22/2015 11.4 10.0 - 20.0 ug/mL Final        Ahsha Hinsley E Aul Mangieri RPH 07/23/2015 8:41 AM

## 2015-07-23 NOTE — Unmapped (Signed)
Problem: Potential for Compromised Skin Integrity  Goal: Skin integrity is maintained or improved  Assess and monitor skin integrity. Identify patients at risk for skin breakdown on admission and per policy. Collaborate with interdisciplinary team and initiate plans and interventions as needed.   Outcome: Progressing  Avoid shearing, keep skin clean and dry, turn patient, relieve pressure to bony prominences, encourage use of moisturizer on skin.

## 2015-07-23 NOTE — Unmapped (Signed)
Patient alert and oriented x4 upon assessment this evening.  Speech is clear, and patient appears calm talking to her roommate.  No c/o cough or sore throat, and lung sounds are diminished. Vitals are stable.  Patient has c/o pain and discomfort with prn pain medication which was effective, and no s/s of distress are evident at this time.  Bed mobility and ADL care are performed with complete staff assistance and without complication. Patient takes medications via IV  well.  Over bed table and call light is within reach.  Side rails are up x2.  Patient with non skid footwear on. IV antibiotics infused without complications, will continue to monitor.

## 2015-07-23 NOTE — Unmapped (Signed)
Hospitalist Progress Note    07/23/2015     3:15 PM    Linda Ponce   LOS: 15 days       HPI;    Principal Problem:    Fistula  Active Problems:    Abdominal abscess    Enterocutaneous fistula    Malnutrition      Subjective & Interval History;    Afebrile, hemodynamically stable, debilitated, very anxious.  Review of Systems:  As above otherwise negative         PMH: Unchanged from H&P.   PSH: Unchanged from H&P.   Family medical history: Unchanged from H&P.   Social history: Unchanged from H&P      Scheduled Meds:  ??? clopidogrel  75 mg Per NG tube Daily 0900   ??? fentaNYL  1 patch Transdermal Q72H   ??? fentaNYL  1 patch Transdermal Q72H   ??? fluconazole in NaCl (iso-osm)  200 mg Intravenous Q24H   ??? insulin regular  0-5 Units Subcutaneous Q6H   ??? piperacillin-tazobactam (ZOSYN) IV extended interval  3.375 g Intravenous Q8H   ??? [START ON 07/24/2015] vancomycin  1,000 mg Intravenous Q24H     Continuous Infusions:  ??? TPN ADULT ([facility]) 62.5 mL/hr at 07/22/15 1809   ??? TPN ADULT ([facility])       PRN Meds:ALPRAZolam, alteplase, dextrose, dextrose 50 % in water (D50W), heparin lock flush, HYDROmorphone, ipratropium-albuterol, LORazepam, ondansetron    Objective:    Vital signs in last 24 hours:  Temp:  [97.7 ??F (36.5 ??C)-98.4 ??F (36.9 ??C)] 98.4 ??F (36.9 ??C)  Heart Rate:  [67-138] 131  Resp:  [18] 18  BP: (111-125)/(74-79) 111/74 mmHg    Physical Examination:  HEENT:Atraumatic.Normocephalic. PERRLA, EOMI. No icterus, no pallor, no jugular venous distention.  NECK: Supple. Trachea is in midline.?? No bruits, no lymphadenopathy  CHEST: Tunneled internal jugular catheter right side. PAC right chest. Symmetric. Clear to auscultation, bilateral equal air entry. No wheezing or rhonchi.  CARDIOVASCULAR: S1, S2, Regular pulse. No murmur or gallops. No JVD.  ABDOMEN: Soft.??LLQ pigtail drain draining serosanguinous fluid. Nephrostomy tubes both flanks draining yellow urine. Foley with  dark urine. Wound LLQ nondraining. Wound with  ostomy in place RLQ. Large midabdominal wound pouch to drainage, two fistulae contained within draining bilious appearing enteric fluid.  EXTREMITIES: generalized edema. Extremities warm. Necrotic, gangrenous toes      Intake/Output last 3 shifts:  I/O last 3 completed shifts:  In: 5024 [P.O.:1800; I.V.:1613; Other:737; IV Piggyback:250]  Out: 1805 [Urine:1800; Stool:5]    Recent Labs;  Lab Results   Component Value Date    WBC 6.6 07/22/2015    RBC 6.63* 07/22/2015    HCT 59.6* 07/22/2015    MCV 89.8 07/22/2015    MCH 28.8 07/22/2015    MCHC 32.1 07/22/2015    RDW 20.0* 07/22/2015    PLT 269 07/22/2015     Lab Results   Component Value Date    GLUCOSE 78 07/22/2015    BUN 44* 07/22/2015    CO2 27 07/22/2015    CREATININE 0.79 07/22/2015    K 4.7 07/22/2015    NA 139 07/22/2015    CL 105 07/22/2015    CALCIUM 8.2* 07/22/2015         Diet;    Diet Orders          TPN ADULT ([facility]) at 62.5 mL/hr starting at 04/11 1800    TPN ADULT ([facility]) at 62.5 mL/hr starting at 04/10 1800  Diet NPO effective now Except for: except ice chips, except sips of clears starting at 03/28 1610          Future Appointments:  No future appointments.    Assessment/Plan:      Colon CA      ?? Abdominal abscess (07/04/2015)  ????????????????????????- continue abx vanc / zosyn?? and diflucan, duration depending on clinical / radiographic improvement. Supposed to be stopped 4/11, patient was seen by surgery today, will review notes, clinically infection appears under control ans can probably d/c AB   - wound care   -  fentanyl patch to 125 mgc/hr total         ????????????????????????  ?? 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      07/23/2015

## 2015-07-23 NOTE — Unmapped (Signed)
Patient went to outside appointment with Dr. Luan Pulling. Wound care aware of new orders. No changes noted sister at bedside.

## 2015-07-23 NOTE — Unmapped (Signed)
Occupational Therapy  Occupational Therapy Progress Note    Name: Linda Ponce  DOB: 1970-07-11  Attending Physician: Fredrich Birks, MD  Admission Diagnosis: jewish 07/05/15 dx: sepsis  Date: 07/23/2015    Pt upon therapist arrival began stating reasons as to why she could not get out of bed this date, sit on EOB or complete any activity. Pt did have appointment early this date but returned with enough time to rest. Pt with complaints of pain and fatigue. Began discussing positioning in bed with pt as pt lies on hip with legs flexed at knees and pillows under them. Pt states,  this is most comfortable for me I move them at night. Pt instructed that when medical issues resolve ambulation will become an issue due to knee contractures. PT attempted stretching but pt unable to tolerate for long periods of time; pt again crying out in pain and reasons why she can't do it. Pt again educated and encouraged to allow stretch. PT with plan for knee immbolizers to produce longer periods of stretch and promote neutral positioning. Pt left with PT all needs met and in reach. Plan to recliner next scheduled visit.    Cont per POC in colboration with OTR/L  [name], COTA/L

## 2015-07-23 NOTE — Unmapped (Signed)
Clinical Pharmacy Note - TPN Order    Linda Ponce is a 45 y.o. female that is being followed by the clinical pharmacy department for monitoring and adjustment of TPN.    Patient is NPO with ice chips    Plan:    -no new TPN labs today  -continue current continuous TPN  -formulation listed below    ????  TPN Electrolytes  ??Sodium Chloride: 80 mEq  ??Sodium Acetate: 0 mEq  ??Sodium Phosphate: 0 mmol  ??Magnesium Sulfate: 14 mEq  ??Calcium Gluconate: 0 mEq  ??Potassium Chloride: 20 mEq  ??Potassium Acetate: 0 mEq  ??Potassium Phosphate: 15 mmol???? TPN Macronutrients  ??Volume: 1500 mL  ??Rate: 62.5 mL/hr  ??Amino Acids: 48 g/L ??  ??Dextrose: 192 g/L ??  ??Lipids: 10 g/L ???? TPN Additives  ??MVI 10 mL  ??TE 1 mL  ??Ascorbic Acid 200mg  ????   ??

## 2015-07-23 NOTE — Unmapped (Signed)
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   Adequate intake via PO and TPN to support healing of areas on skin as diagnosis allows.   Outcome: Progressing  Weight decreased to 94 lb  Blood sugars acceptable  Adequate hydration indicated, BUN elevated as before  Rec increase kcal in TPN to 1575/day

## 2015-07-23 NOTE — Unmapped (Addendum)
[name]  Nutrition Progress Note        Nutrition Diagnosis:   1.  Altered GI Function - Continues  New Nutrition Diagnosis:  No    Diet: NPO x sips of clears  TPN:  62.5 ml/h = 1500 ml total volume per day, AA 72 gram/day, Dextrose 288 gram/day, Lipid 15 gram/day = 1416 kcal  PO Intake: 240-360  ml po clear liquids/ice chips per shift    Weight: (!) 94 lb 4 oz (42.752 kg) (pt declined items begin removed from bed) (07/21/15 0700)     Skin Condition: left lateral abd fistula slightly smaller, mid abd wound with fistula some granulation and some non granulation, right lateral hip resolved, stage 3 coccyx improved, vascular areas right foot no significant change (per MD note 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)  Edema: none    Labs:  Lab Results   Component Value Date    CREATININE 0.79 07/22/2015    BUN 44* 07/22/2015    NA 139 07/22/2015    K 4.7 07/22/2015    CL 105 07/22/2015    CO2 27 07/22/2015     Lab Results   Component Value Date    MG 2.3 07/22/2015     Lab Results   Component Value Date    OSMOLALITY 298 07/22/2015     Lab Results   Component Value Date    CALCIUM 8.2* 07/22/2015    PHOS 4.7 07/22/2015    GLUCOSE 78 07/22/2015     No results found for: HGBA1C  Lab Results   Component Value Date    WBC 6.6 07/22/2015    HGB 19.1* 07/22/2015    HCT 59.6* 07/22/2015    MCV 89.8 07/22/2015    PLT 269 07/22/2015         Component Value Date/Time    POCGMD 117* 07/23/2015 1652    POCGMD 119* 07/23/2015 1225    POCGMD 115* 07/23/2015 0540    POCGMD 115* 07/22/2015 2331    POCGMD 105* 07/22/2015 1645     No results found for: VITD25H      Scheduled Meds:  ??? clopidogrel  75 mg Per NG tube Daily 0900   ??? fentaNYL  1 patch Transdermal Q72H   ??? fentaNYL  1 patch Transdermal Q72H   ??? fluconazole in NaCl (iso-osm)  200 mg Intravenous Q24H   ??? insulin regular  0-5 Units Subcutaneous Q6H   ??? piperacillin-tazobactam (ZOSYN) IV extended interval  3.375 g Intravenous Q8H   ??? [START ON  07/24/2015] vancomycin  1,000 mg Intravenous Q24H     Continuous Infusions:  ??? TPN ADULT ([facility]) 62.5 mL/hr at 07/22/15 1809   ??? TPN ADULT ([facility])       PRN Meds:.ALPRAZolam, alteplase, dextrose, dextrose 50 % in water (D50W), heparin lock flush, HYDROmorphone, ipratropium-albuterol, LORazepam, ondansetron    Note:  Continues on TPN macronutrients and volume same. Electrolytes adjusted per Pharmacy to reduce magnesium, and reduce potassium phosphate.  Weight of 4/9 decreased from admission weight. Reweigh requested but staff report pt refusing to be weighed.  Serum Na WNL. BUN elevated as before. Note H/Hct elevated.  Taking in some clear liquids PO.  Blood sugars in adequate control.     Nutrition Prescription:   Food and Nutrition Therapy:  Rec adjust TPN to increase calories to: Total Volume 1675 ml, rate 69.8 ml/hr, AA 80 g/day, Dextrose 295g/day, Lipid 25 g/day, to provide 1575 kcal/day or 35 kcal/kg adm wt (100  lb)     Coordination of Nutrition Care: Wound Care Team, Pharmacy    Monitoring:weight trend, labs, Intake.  Plan of Care:Continue    Follow Up: 7-10  Days    Mackey Birchwood RDN, LD  Pager [phone] Phone [phone]    07/23/2015 5:06 PM

## 2015-07-23 NOTE — Plan of Care (Signed)
Pt has necrotic toes, dry, no open areas. Abdomen with 2 wounds, Mid and left. Wound pouches on and in place. Replaced yesterday per [name] staff per pt. Not leaking. Intact. Did not remove. Photos taken through bags. Pt did not wan to transfer to bed. Stats Right hip healed and coccyx wound being treated with Medihoney per pt/family

## 2015-07-23 NOTE — Plan of Care (Signed)
NAME:  Building control surveyor  DATE OF BIRTH:  [birth year]  MEDICAL RECORD NUMBER:  [id number]  DATE:  07/23/2015    Dressing applied per orders.  Discharge instructions given.  Patient verbalized understanding.  Return to [facility] in 1 week.  Called/faxed orders to  [facility]- orders sent with pt        ??? 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    [x] Pain related to Atypical Ulcer    [x] Knowledge deficit related to the diagnosis & management of Atypical ulcer    Goals    [] Non invasive Arterial/Venous studies ordered       []  Goal met          [] Goal not met     [x] Control of pain,exudate      [x]  Goal met          [] Goal not met    [x] Verbalize understanding of disease process      [x]  Goal met          [] Goal not met     [] Improved Oxygenation      []  Goal met          [] Goal not met      Nursing Interventions/Evaluation    [x] At every visit assess Pain, exudate    [x] Provide education on Atypical Ulcer    [x] Assessment of ulcer each visit      Diagnostic/Treatment activity    [] non-invasive arterial/Venous studies    [] Bx/Pathology   [] Cx  [] Consult       [] Debridement    I[] Scheduled Angiogram       Electronically signed by Thea Silversmith, RN on 07/23/2015 at 10:49 AM

## 2015-07-23 NOTE — Progress Notes (Signed)
[facility]  Progress Note       Erica Harding  AGE: 45 y.o.   GENDER: female  DOB: Feb 27, 1971  TODAY'S DATE:  07/23/2015    Subjective:     Chief Complaint   Patient presents with   ??? Wound Check     Abdomen, Rigth foot         HISTORY of PRESENT ILLNESS HPI     Erica Harding is a 45 y.o. female who presents today for wound evaluation.   History of Wound: Open abdominal wound with fistula, dry gangrene of the right foot    Wound Pain:  moderate  Severity:  6 / 10   Wound Type:  arterial and non-healing surgical  Modifying Factors:  none  Associated Signs/Symptoms:  none        PAST MEDICAL HISTORY        Diagnosis Date   ??? Colorectal cancer ([place])        PAST SURGICAL HISTORY    Past Surgical History:   Procedure Laterality Date   ??? ABDOMEN SURGERY     ??? COLONOSCOPY     ??? HYSTERECTOMY         FAMILY HISTORY    History reviewed. No pertinent family history.    SOCIAL HISTORY    Social History   Substance Use Topics   ??? Smoking status: Never Smoker   ??? Smokeless tobacco: None   ??? Alcohol use No       ALLERGIES    Allergies   Allergen Reactions   ??? Latex Itching   ??? Compazine [Prochlorperazine Maleate] Swelling       MEDICATIONS    Current Outpatient Prescriptions on File Prior to Encounter   Medication Sig Dispense Refill   ??? vancomycin (VANCOCIN) 500 MG/100ML IVPB Infuse 150 mLs intravenously every 24 hours 2500 mL 0   ??? fentaNYL (DURAGESIC) 25 MCG/HR Place 1 patch onto the skin every 72 hours . 10 patch 0   ??? HYDROmorphone (DILAUDID) 1 MG/ML injection Infuse 1 mL intravenously every 2 hours as needed for Pain . 1 mL 0   ??? acetaminophen (OFIRMEV) 10 MG/ML SOLN infusion Infuse 74.1 mLs intravenously every 6 hours as needed for Fever or Pain 4000 mL 0   ??? aspirin 81 MG chewable tablet 1 tablet by Per NG tube route daily 30 tablet 3   ??? LORazepam (ATIVAN) 2 MG/ML injection Infuse 0.5 mLs intravenously every 4 hours as needed (anxiety) 1 mL 0   ??? ipratropium-albuterol (DUONEB) 0.5-2.5 (3) MG/3ML SOLN  nebulizer solution Inhale 3 mLs into the lungs every 4 hours as needed for Shortness of Breath 360 mL 0   ??? glucose (GLUTOSE) 40 % GEL Take 15 g by mouth as needed (glc<70) 45 g 1   ??? insulin lispro (HUMALOG) 100 UNIT/ML pen Inject 0-6 Units into the skin every 4 hours 5 Pen 3   ??? ondansetron (ZOFRAN) 4 MG/2ML injection Infuse 2 mLs intravenously every 6 hours as needed for Nausea 15 mL 0   ??? fluconazole (DIFLUCAN) 200MG /100ML IVPB Infuse 100 mLs intravenously every 24 hours 100 mL 0   ??? magnesium sulfate 10-5 MG/ML-% Infuse 100 mLs intravenously as needed (Per IV Magnesium Replacement Protocol) 100 mL 0   ??? potassium chloride 20 MEQ/50ML IVPB Infuse 50 mLs intravenously as needed (Per IV Potassium Replacement Protocol) 50 mL 0   ??? clopidogrel (PLAVIX) 75 MG tablet 1 tablet by Per NG tube route daily 30 tablet  3   ??? fat emulsion 20 % infusion Infuse 250 mLs intravenously twice a week 250 mL 0   ??? famotidine (PEPCID) 20 MG/2ML injection Infuse 2 mLs intravenously daily 2 mL 0     No current facility-administered medications on file prior to encounter.        REVIEW OF SYSTEMS    Pertinent items are noted in HPI.      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  No evidence of infection  Assessment:     Patient Active Problem List   Diagnosis   ??? AKI (acute kidney injury) ([place])   ??? Hyperkalemia   ??? Open wnd ant abdomen-comp   ??? Peritonitis and retroperitoneal infections ([place])   ??? Fistula   ??? Ischemic foot   ??? Cachexia ([place])   ??? Postprocedural intra-abdominal sepsis ([place])   ??? Abscess   ??? Dehydration   ??? Encephalopathies   ??? Gangrene of foot ([place])   ??? Enterocutaneous fistula   ??? Abnormal CT of the chest   ??? Infiltrate noted on imaging study       Pressure Ulcer 06/25/15 Sacrum  Left (Active)   Peri-wound Assessment Pink;Other (Comment) 07/08/2015  2:00 PM   Peri-Wound Color Pink 07/08/2015  2:00 PM   Pressure Ulcer Staging Stage II 07/08/2015  2:00 PM   Assessment Painful;Pale;Red;Other (Comment) 07/08/2015  2:00 PM   Shape irregx2 07/02/2015  2:23 PM   Wound Length (cm) 2 cm 07/08/2015  2:00 PM   Wound Width (cm) 1.5 cm 07/08/2015  2:00 PM   Calculated Wound Size (cm^2) (l*w) 3 cm^2 07/08/2015  2:00 PM   Culture Taken No 07/08/2015  2:00 PM   Dressing Status Intact 07/08/2015 12:00 PM   Dressing Changed Changed/New 07/07/2015  4:26 PM   Dressing/Treatment Open to air;Protective barrier 07/08/2015  2:00 PM   Wound Cleansed Rinsed/Irrigated with saline 07/02/2015  2:23 PM   Dressing Change Due 07/05/15 07/02/2015  2:23 PM   Drainage Amount None 07/02/2015  2:23 PM   Odor None 07/08/2015  2:00 PM   Pink%Wound Bed 100 07/02/2015  2:23 PM   Yellow%Wound Bed 75 07/02/2015  4:00 AM   Margins Attached edges;Unattached edges 07/08/2015  2:00 PM   Number of days:27       Pressure Ulcer 06/25/15 Hip Right (Active)   Peri-wound Assessment Intact;Fragile 07/08/2015  2:00 PM   Peri-Wound Moisture Other(Comment) 07/08/2015  2:00 PM   Pressure Ulcer Staging Unstagable 07/08/2015  2:00 PM   Assessment Slough 07/08/2015  2:00 PM   Shape oval 07/02/2015  2:23 PM   Wound Length (cm) 2 cm 07/08/2015  2:00 PM   Wound Width (cm) 2 cm 07/08/2015  2:00 PM   Calculated Wound Size (cm^2) (l*w) 4 cm^2 07/08/2015  2:00 PM   Change in Wound Size % (l*w) -166.67 07/08/2015  2:00 PM   Dressing Status Intact 07/08/2015  2:00 PM   Dressing Changed Changed/New 07/07/2015  4:26 PM   Dressing/Treatment Foam 07/08/2015 12:00 PM   Wound Cleansed Rinsed/Irrigated with saline 07/02/2015  2:23 PM   Dressing Change Due 07/05/15 07/02/2015  2:23 PM   Necrotic Type Yellow Fibrin/Slough 07/08/2015  2:00 PM  Drainage Amount None 07/02/2015  2:23 PM   Drainage Description Serosanguinous;Yellow 07/08/2015  2:00 PM   Odor None 07/08/2015  2:00 PM   Yellow%Wound Bed 100  07/08/2015  2:00 PM   Margins Attached edges;Unattached edges 07/08/2015  2:00 PM   Number of days:28       Wound 06/25/15 Other (Comment) Abdomen Anterior;Mid midline abdominal surgical incision (Active)   Peri-wound Assessment Pink;Red;Other (Comment) 07/08/2015  2:00 PM   Peri-Wound Texture Localized edema 07/05/2015  9:00 PM   Peri-Wound Moisture Weeping 07/07/2015  8:19 AM   Peri-Wound Color Pink;Other 07/08/2015  2:00 PM   Non-staged Wound Description Full thickness 07/03/2015  3:06 PM   Assessment Fragile;Red;Other (Comment) 07/08/2015  2:00 PM   Shape linear/irreg 07/02/2015  2:23 PM   Wound Length (cm) 12 cm 07/08/2015  2:00 PM   Wound Width (cm) 5 cm 07/08/2015  2:00 PM   Depth (cm)  1 07/08/2015  2:00 PM   Calculated Wound Size (cm^2) (l*w) 60 cm^2 07/08/2015  2:00 PM   Change in Wound Size % (l*w) -25 07/08/2015  2:00 PM   Closure Other (Comment) 07/08/2015 12:00 PM   Culture Taken No 07/08/2015  2:00 PM   Dressing Status Changed;Other (Comment) 07/08/2015  2:00 PM   Dressing Changed Dressing reinforced 07/08/2015 12:00 PM   Dressing/Treatment Dry dressing;Other (Comment) 07/08/2015 12:00 PM   Wound Cleansed Rinsed/Irrigated with saline 07/08/2015  2:00 PM   Dressing Change Due 07/02/15 07/02/2015  2:23 PM   Drainage Amount Large 07/08/2015  2:00 PM   Drainage Description Brown;Green 07/08/2015  2:00 PM   Odor None 07/08/2015  2:00 PM   Margins Unattached edges 07/08/2015  2:00 PM   Procedural Pain 10 06/25/2015  1:15 PM   Number of days:27       Wound 06/25/15 Other (Comment) Abdomen Left;Lateral open incision to left lateral abdominal wall (Active)   Peri-wound Assessment Intact;Fragile;Pink 07/08/2015  2:00 PM   Peri-Wound Texture Localized edema 07/05/2015  9:00 PM   Peri-Wound Moisture Other(Comment) 07/08/2015  2:00 PM   Peri-Wound Color Pink 07/07/2015  8:19 AM   Non-staged Wound Description Full thickness 07/03/2015 12:52 AM   Assessment Fragile;Pink;Red;Other (Comment) 07/08/2015  2:00 PM   Shape oval 07/02/2015  2:23 PM    Wound Length (cm) 6 cm 07/08/2015  2:00 PM   Wound Width (cm) 1 cm 07/08/2015  2:00 PM   Depth (cm)  1.5 07/08/2015  2:00 PM   Calculated Wound Size (cm^2) (l*w) 6 cm^2 07/08/2015  2:00 PM   Change in Wound Size % (l*w) 0 07/08/2015  2:00 PM   Closure None 07/08/2015 12:00 PM   Dressing Status Intact 07/08/2015  2:00 PM   Dressing Changed Dressing reinforced 07/08/2015 12:00 PM   Dressing/Treatment Other (Comment) 07/08/2015  2:00 PM   Wound Cleansed Rinsed/Irrigated with saline 07/02/2015  2:23 PM   Dressing Change Due 06/29/15 06/29/2015  1:00 PM   Drainage Amount Large 07/08/2015  2:00 PM   Drainage Description Brown;Green 07/08/2015  2:00 PM   Odor None 07/08/2015  2:00 PM   Pink%Wound Bed 100 07/02/2015  2:23 PM   Margins Unattached edges 07/08/2015  2:00 PM   Number of days:27       Wound 07/05/15 Other (Comment) Foot Right all 5 toes on right foot (Active)   Peri-wound Assessment Black 07/08/2015 12:00 PM   Peri-Wound Moisture Dry;Painful;Swelling 07/07/2015  8:19 AM   Peri-Wound Color Red 07/07/2015  8:19 AM   Assessment  Black;Dry;Red 07/08/2015 12:00 PM   Closure Open to air 07/08/2015 12:00 PM   Necrotic Amount Large: 67-100% 07/07/2015  8:19 AM   Drainage Amount None 07/08/2015 12:00 PM   Number of days:13     45 year old female with a very complex past surgical history.  She is to open abdominal wounds which are draining enteric contents.  The drainage is well controlled with pouching.  The patient continues on TPN and bowel rest.  She also has dry gangrene of tips of her right first through fifth toes.  There is no evidence of infection.      Plan:     Continue current treatment of the abdominal wounds with bowel rest and TPN.  Will continue to paint the toes of the right foot with Betadine.  Follow-up in one week.    Discharge Treatment          Written Patient Discharge Instructions Given            Electronically signed by Elly Modena, MD on 07/23/2015 at 10:45 AM

## 2015-07-24 LAB — POC GLU MONITORING DEVICE
POC Glucose Monitoring Device: 109 mg/dL — ABNORMAL HIGH (ref 70–100)
POC Glucose Monitoring Device: 105 mg/dL (ref 70–100)
POC Glucose Monitoring Device: 131 mg/dL — ABNORMAL HIGH (ref 70–100)

## 2015-07-24 MED ORDER — TPN ADULT (WCH/Drake)
4 | INTRAVENOUS | Status: AC
Start: 2015-07-24 — End: 2015-07-25
  Administered 2015-07-24: 22:00:00 via INTRAVENOUS

## 2015-07-24 MED FILL — HYDROMORPHONE 2 MG/ML INJECTION SYRINGE: 2 2 mg/mL | INTRAMUSCULAR | Qty: 2

## 2015-07-24 MED FILL — TRAVASOL 10 % INTRAVENOUS SOLUTION: 10 10 % | INTRAVENOUS | Qty: 888

## 2015-07-24 MED FILL — CLOPIDOGREL 75 MG TABLET: 75 75 mg | ORAL | Qty: 1

## 2015-07-24 MED FILL — FLUCONAZOLE 200 MG/100 ML IN SOD. CHLORIDE (ISO) INTRAVENOUS PIGGYBACK: 200 200 mg/100 mL | INTRAVENOUS | Qty: 100

## 2015-07-24 MED FILL — PIPERACILLIN-TAZOBACTAM 3.375 GRAM INTRAVENOUS SOLUTION: 3.375 3.375 gram | INTRAVENOUS | Qty: 1

## 2015-07-24 MED FILL — VANCOMYCIN 1,000 MG INTRAVENOUS INJECTION: 1000 1000 mg | INTRAVENOUS | Qty: 1000

## 2015-07-24 MED FILL — FENTANYL 100 MCG/HR TRANSDERMAL PATCH: 100 100 mcg/hr | TRANSDERMAL | Qty: 1

## 2015-07-24 NOTE — Unmapped (Signed)
MD informed of medication error  Of fentanyl patch in person, during rounding with patient, patient aware of incident as well.

## 2015-07-24 NOTE — Unmapped (Signed)
Assessment completed call light within  Re'ach refusing complete assessm,ent this am went to place new fentanyl patch 100 missing phar manager lori aware' pt stating tought  It was only i patch since diluadid  Increased  Not found in room  Pt has decreased rom to hands

## 2015-07-24 NOTE — Unmapped (Signed)
Clinical Pharmacy Note - TPN Order    Linda Ponce is a 45 y.o. female that is being followed by the clinical pharmacy department for monitoring and adjustment of TPN.    Patient is NPO with ice chips, sips of clear    Plan:    -change macro nutrients per dietician, Linda Ponce, as follows:  Total Volume 1675 ml, rate 69.8 ml/hr, AA 80 g/day, Dextrose 295g/day, Lipid 25 g/day, to provide 1575 kcal/day or 35 kcal/kg adm wt (100 lb)   -no new TPN labs to review today  -continue current continuous TPN with adjustments as above  -new formulation listed below      ????  TPN Electrolytes  ??Sodium Chloride: 80 mEq  ??Sodium Acetate: 0 mEq  ??Sodium Phosphate: 0 mmol  ??Magnesium Sulfate: 14 mEq  ??Calcium Gluconate: 0 mEq  ??Potassium Chloride: 20 mEq  ??Potassium Acetate: 0 mEq  ??Potassium Phosphate: 15 mmol?????? TPN Macronutrients  ??Volume: 1675 mL  ??Rate: 69.8 mL/hr  ??Amino Acids: 53 g/L ??  ??Dextrose: 197 g/L ??  ??Lipids: 17 g/L ?????? TPN Additives  ??MVI 10 mL  ??TE 1 mL  ??Ascorbic Acid 200mg  ??????   ????                    Linda Ponce [facility] 07/24/2015 7:59 AM

## 2015-07-24 NOTE — Unmapped (Signed)
07/24/15 1400   Clinical Encounter Type   Visited With Patient

## 2015-07-24 NOTE — Unmapped (Signed)
Hospitalist Progress Note    07/24/2015     12:38 PM    Linda Ponce   LOS: 16 days       HPI;    Principal Problem:    Fistula  Active Problems:    Abdominal abscess    Enterocutaneous fistula    Malnutrition      Subjective & Interval History;    Afebrile, hemodynamically stable, continues to complain of pain level of 9 improved to a 7 allowing her to rest. Appears to be in no distress, awake, alert.    Review of Systems:  As above otherwise negative         PMH: Unchanged from H&P.   PSH: Unchanged from H&P.   Family medical history: Unchanged from H&P.   Social history: Unchanged from H&P      Scheduled Meds:  ??? clopidogrel  75 mg Per NG tube Daily 0900   ??? fentaNYL  1 patch Transdermal Q72H   ??? fentaNYL  1 patch Transdermal Q72H   ??? fluconazole in NaCl (iso-osm)  200 mg Intravenous Q24H   ??? insulin regular  0-5 Units Subcutaneous Q6H   ??? piperacillin-tazobactam (ZOSYN) IV extended interval  3.375 g Intravenous Q8H   ??? vancomycin  1,000 mg Intravenous Q24H     Continuous Infusions:  ??? TPN ADULT ([facility]) 62.5 mL/hr at 07/23/15 1741   ??? TPN ADULT ([facility])       PRN Meds:ALPRAZolam, alteplase, dextrose, dextrose 50 % in water (D50W), heparin lock flush, HYDROmorphone, ipratropium-albuterol, LORazepam, ondansetron    Objective:    Vital signs in last 24 hours:  Temp:  [98.2 ??F (36.8 ??C)-98.4 ??F (36.9 ??C)] 98.2 ??F (36.8 ??C)  Heart Rate:  [85-126] 85  Resp:  [17-18] 17  BP: (101-123)/(69-76) 123/74 mmHg    Physical Examination:     GENERAL: The patient is awake, alert, oriented?? not in acute cardiorespiratory distress. ??  HEENT:Atraumatic.Normocephalic. PERRLA, EOMI. No icterus, no pallor, no jugular venous distention. ??  NECK: Supple. Trachea is in midline. No Thyromegaly. No Lymphadenopathy.  CHEST: Symmetric. Clear to auscultation, bilateral equal air entry. No wheezing or rhonchi.  CARDIOVASCULAR: S1, S2, Regular pulse, tachy rate. No murmur or gallops. No JVD.  ABDOMEN: Soft, nontender, nondistended,  positive bowel sounds.No viscera palpable.  EXTREMITIES: There is no edema in the lower extremities.?? Pulses are positive in all extremities.Power is 5/5 in all extremities.(+) dry gangrene R foot  ??    Intake/Output last 3 shifts:  I/O last 3 completed shifts:  In: 3030 [P.O.:1080; I.V.:161; IV Piggyback:155]  Out: 2025 [Urine:625; Drains:1000; Stool:400]    Recent Labs;  @SLAB @  No results found for: INR, PROTIME    ABG;  No results found for: PHART, PCO2, PO2ART, HCO3ART, BEART, HBO2PER, O2SATART        Last  Culture and Gram Stain;  No results found for: AEROBOT, ANABOT, LABGRAM, ISO2, ISO3, ISO4, ISO5, ISO6, PNAFISH      Diet;    Diet Orders          TPN ADULT ([facility]) at 69.8 mL/hr starting at 04/12 1800    TPN ADULT ([facility]) at 62.5 mL/hr starting at 04/11 1800    Diet NPO effective now Except for: except ice chips, except sips of clears starting at 03/28 5956          Future Appointments:  No future appointments.    Assessment/Plan:      Colon CA   - will discuss with sister, s/p radiation ,  surgery but no records avail      ?? Abdominal abscess (07/04/2015)  ????????????????????????- continue abx vanc / zosyn?? and diflucan, duration depending on clinical / radiographic improvement.   - wound care   - increased fentanyl patch to 125 mgc/hr total         ????????????????????????  ?? 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   Pager# [phone]    07/24/2015

## 2015-07-24 NOTE — Unmapped (Signed)
Problem: Knowledge Deficit  Goal: Patient/family/caregiver demonstrates understanding of disease process, treatment plan, medications, and discharge instructions  Complete learning assessment and assess knowledge base.   Intervention: Provide teaching at level of understanding  ducate to therapy

## 2015-07-24 NOTE — Unmapped (Signed)
Occupational Therapy  [facility]  Occupational Therapy      Name: Linda Ponce  DOB: 1971-03-08  Attending Physician: Corrinne Eagle, MD  Admission Diagnosis: jewish 07/05/15 dx: sepsis  Date: 07/24/2015    Reviewed Pertinent hospital course: Yes   Hospital Course PT/OT: 36f with history of colorectal CA, initially admitted to [facility] post operatively for continued wound care. Developed bleeding, sent back to [facility] where she was foundto have multiple pelvic abscesses, enterocutaneous fistulas, was kept NPO, started on IV abx, TPN, and returns for continued care. PRECAUTIONS: FULL CODE, AAT, NO WEIGHT BEARING RESTRICTIONS, ISCHEMIC R TOES, MULTIPLE DRAINS FROM ABDOMEN AND BACK       Brief Assessment  Assessment  Assessment: Decreased ADL status, Decreased self-care trans, Decreased Functional Mobility, Decreased activity tolerance  Prognosis: Guarded  Goal Formulation: Other (comment) (per therapist )    Goals  Pt Will demonstrate functional chair transfer:  (tolerate assessment; by July 17, 2015)  Pt Will participate in upper extremity HEP to prep for ADLs: strengthening / activity tolerance for functional use: Ongoing   Miscellaneous Goal #1: Patient will tolerate sitting EOB with maximal assist: By July 17, 2015  Long Term Goal : Patient to be seen 2-3 week trial period to determine if she can participate and benefit from active OT programming  Time frame for goals to be met in: 3 weeks     Recommendation  Plan  Treatment Interventions: Compensatory technique education, Therapeutic Activity, Patient/Family training, Excercise, ADL retraining, Functional transfer training, Activity Tolerance training, Energy Conservation  OT Frequency: 1-3x/wk    Cognition  Overall Cognitive Status: Within Functional Limits    Pain  Pain Score:   8  Pain Location: Generalized  Pain Descriptors: Aching  Pain Intervention(s): Medication (See eMAR)     Mobility  Bed Mobility  Supine to Sit: Max assist to left (x  1 )  Functional Transfers  Sit to Stand: Max assist  Bed to Chair: Max assist to left;Stand Pivot       Pt co treated with PT increase assistance needed with functional mobility due anxiety and pain. Pt required max encouragement and time to get EOB and then transfer to recliner; refer above for assistance. Once up and positioned in recliner pt in good spirits. Pt left up in tv room with roommate. Nurse aide aware of transfer status.  Cont per POC in colboration with OTR/L  [name], COTA/L    Patient Education:  Importance of getting up daily    Time  Start Time: 1300  Stop Time: 1345  Time Calculation (min): 45 min    Charges       $Therapeutic Activity: 38-52 mins

## 2015-07-24 NOTE — Unmapped (Signed)
Problem: Potential for Compromised Skin Integrity  Goal: Skin integrity is maintained or improved  Assess and monitor skin integrity. Identify patients at risk for skin breakdown on admission and per policy. Collaborate with interdisciplinary team and initiate plans and interventions as needed.   Outcome: Progressing  .  Intervention: Turn patient  .  Intervention: Relieve pressure to bony prominences  Provide measures to decrease pressure to skin such as specialty mattresses, egg crate mattresses and beds, elbow and heel protectors.   .  Intervention: Avoid shearing  Use lift devices to decrease friction and shearing.   [name]

## 2015-07-24 NOTE — Unmapped (Signed)
Patient sat out at the common TV area all day.  At the end of the shift she stated she still did not want to return to her room to have IV Dressing changed.  Moss Mc, Charge RN advised of same.

## 2015-07-24 NOTE — Unmapped (Signed)
Pt alert and oriented, vital signs stable, complaining of generalized pain and requesting Dilaudid 4mg  PRN, dilaudid given as ordered, pt reported some relief. Pt states pain level 6/10 it good for me Abdominal dressings clean and intact, TPN running at 62.5/hr.

## 2015-07-25 LAB — RENAL FUNCTION PANEL W/EGFR
Albumin: 1.5 g/dL (ref 3.5–5.7)
Anion Gap: 4 mmol/L (ref 3–16)
BUN: 35 mg/dL (ref 7–25)
CO2: 26 mmol/L (ref 21–33)
Calcium: 8 mg/dL (ref 8.6–10.3)
Chloride: 109 mmol/L (ref 98–110)
Creatinine: 0.54 mg/dL (ref 0.60–1.30)
Glucose: 70 mg/dL (ref 70–100)
Osmolality, Calculated: 294 mOsm/kg (ref 278–305)
Phosphorus: 3.8 mg/dL (ref 2.1–4.7)
Potassium: 4.4 mmol/L (ref 3.5–5.3)
Sodium: 139 mmol/L (ref 133–146)
eGFR AA CKD-EPI: >90 See note.
eGFR NONAA CKD-EPI: >90 See note.

## 2015-07-25 LAB — POC GLU MONITORING DEVICE
POC Glucose Monitoring Device: 104 mg/dL (ref 70–100)
POC Glucose Monitoring Device: 104 mg/dL (ref 70–100)
POC Glucose Monitoring Device: 105 mg/dL (ref 70–100)
POC Glucose Monitoring Device: 106 mg/dL (ref 70–100)

## 2015-07-25 LAB — CBC
Hematocrit: 30.3 % (ref 35.0–45.0)
Hemoglobin: 9.7 g/dL (ref 11.7–15.5)
MCH: 28.8 pg (ref 27.0–33.0)
MCHC: 32.1 g/dL (ref 32.0–36.0)
MCV: 89.8 fL (ref 80.0–100.0)
MPV: 7.1 fL (ref 7.5–11.5)
Platelets: 677 10*3/uL (ref 140–400)
RBC: 3.37 10*6/uL (ref 3.80–5.10)
RDW: 19.9 % (ref 11.0–15.0)
WBC: 11.4 10*3/uL (ref 3.8–10.8)

## 2015-07-25 LAB — MAGNESIUM: Magnesium: 1.9 mg/dL (ref 1.5–2.5)

## 2015-07-25 MED ORDER — TPN ADULT (WCH/Drake)
4 | INTRAVENOUS | Status: AC
Start: 2015-07-25 — End: 2015-07-26
  Administered 2015-07-25: 23:00:00 via INTRAVENOUS

## 2015-07-25 MED ORDER — insulin regular (HumuLIN R) injection Soln 0-5 Units
100 | Freq: Every day | INTRAMUSCULAR | Status: AC
Start: 2015-07-25 — End: 2015-09-13
  Administered 2015-07-26: 14:00:00 3 [IU] via SUBCUTANEOUS

## 2015-07-25 MED FILL — VANCOMYCIN 1,000 MG INTRAVENOUS INJECTION: 1000 1000 mg | INTRAVENOUS | Qty: 1000

## 2015-07-25 MED FILL — FENTANYL 25 MCG/HR TRANSDERMAL PATCH: 25 25 mcg/hr | TRANSDERMAL | Qty: 1

## 2015-07-25 MED FILL — HYDROMORPHONE 2 MG/ML INJECTION SYRINGE: 2 2 mg/mL | INTRAMUSCULAR | Qty: 2

## 2015-07-25 MED FILL — FENTANYL 100 MCG/HR TRANSDERMAL PATCH: 100 100 mcg/hr | TRANSDERMAL | Qty: 1

## 2015-07-25 MED FILL — PIPERACILLIN-TAZOBACTAM 3.375 GRAM INTRAVENOUS SOLUTION: 3.375 3.375 gram | INTRAVENOUS | Qty: 1

## 2015-07-25 MED FILL — TRAVASOL 10 % INTRAVENOUS SOLUTION: 10 10 % | INTRAVENOUS | Qty: 888

## 2015-07-25 MED FILL — FLUCONAZOLE 200 MG/100 ML IN SOD. CHLORIDE (ISO) INTRAVENOUS PIGGYBACK: 200 200 mg/100 mL | INTRAVENOUS | Qty: 100

## 2015-07-25 MED FILL — CLOPIDOGREL 75 MG TABLET: 75 75 mg | ORAL | Qty: 1

## 2015-07-25 MED FILL — HUMULIN R REGULAR U-100 INSULIN 100 UNIT/ML INJECTION SOLUTION: 100 100 unit/mL | INTRAMUSCULAR | Qty: 3

## 2015-07-25 NOTE — Unmapped (Signed)
Hospitalist Progress Note    07/25/2015     6:42 PM    Linda Ponce   LOS: 17 days       HPI;    Principal Problem:    Fistula  Active Problems:    Abdominal abscess    Enterocutaneous fistula    Malnutrition      Subjective & Interval History;    Afebrile, hemodynamically stable,   Appears to be in no distress, awake, alert. Pain control slightly better, no excessive sedation noted    Review of Systems:  As above otherwise negative         PMH: Unchanged from H&P.   PSH: Unchanged from H&P.   Family medical history: Unchanged from H&P.   Social history: Unchanged from H&P      Scheduled Meds:  ??? clopidogrel  75 mg Per NG tube Daily 0900   ??? fentaNYL  1 patch Transdermal Q72H   ??? fentaNYL  1 patch Transdermal Q72H   ??? fluconazole in NaCl (iso-osm)  200 mg Intravenous Q24H   ??? [START ON 07/26/2015] insulin regular  0-5 Units Subcutaneous Daily 0900   ??? piperacillin-tazobactam (ZOSYN) IV extended interval  3.375 g Intravenous Q8H   ??? vancomycin  1,000 mg Intravenous Q24H     Continuous Infusions:  ??? TPN ADULT ([facility]) 69.8 mL/hr at 07/25/15 1830     PRN Meds:ALPRAZolam, alteplase, dextrose, dextrose 50 % in water (D50W), heparin lock flush, HYDROmorphone, ipratropium-albuterol, LORazepam, ondansetron    Objective:    Vital signs in last 24 hours:  Temp:  [98 ??F (36.7 ??C)-98.5 ??F (36.9 ??C)] 98 ??F (36.7 ??C)  Heart Rate:  [119-129] 129  Resp:  [20] 20  BP: (99-115)/(63-73) 115/68 mmHg    Physical Examination:     GENERAL: The patient is awake, alert, oriented?? not in acute cardiorespiratory distress, awakens easily  HEENT:Atraumatic.Normocephalic. PERRLA, EOMI. No icterus, no pallor, no jugular venous distention. ??  NECK: Supple. Trachea is in midline. No Thyromegaly. No Lymphadenopathy.  CHEST: Symmetric. Clear to auscultation, bilateral equal air entry. No wheezing or rhonchi.  CARDIOVASCULAR: S1, S2, Regular pulse, tachy rate. No murmur or gallops. No JVD.  ABDOMEN: Soft, nontender, nondistended, positive bowel  sounds.No viscera palpable.  EXTREMITIES: There is no edema in the lower extremities.?? Pulses are positive in all extremities.Power is 5/5 in all extremities.(+) dry gangrene R foot  ??    Intake/Output last 3 shifts:  I/O last 3 completed shifts:  In: 3782 [P.O.:1200; I.V.:161; IV Piggyback:100]  Out: 2005 [Urine:900; Drains:1100; Stool:5]    Recent Labs;  @SLAB @  No results found for: INR, PROTIME    ABG;  No results found for: PHART, PCO2, PO2ART, HCO3ART, BEART, HBO2PER, O2SATART        Last  Culture and Gram Stain;  No results found for: AEROBOT, ANABOT, LABGRAM, ISO2, ISO3, ISO4, ISO5, ISO6, PNAFISH      Diet;    Diet Orders          TPN ADULT ([facility]) at 69.8 mL/hr starting at 04/13 1800    Diet NPO effective now Except for: except ice chips, except sips of clears starting at 03/28 4696          Future Appointments:  No future appointments.    Assessment/Plan:      Leukocytosis   - repeat in am   - check urine, lft's        Colon CA   - will discuss with sister, s/p radiation , surgery  but no records avail      ?? Abdominal abscess (07/04/2015)  ????????????????????????- continue abx vanc / zosyn?? and diflucan, duration depending on clinical / radiographic improvement.   - wound care   - increased fentanyl patch to 125 mgc/hr total         ????????????????????????  ?? 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   Pager# [phone]    07/25/2015

## 2015-07-25 NOTE — Unmapped (Signed)
Patient alert and oriented x4 upon assessment this evening.  Speech is clear, and patient appears calm.  No c/o cough or sore throat, and lung sounds are clear. Vitals are stable.  Patient has c/o pain and discomfort with prn pain medication administration which was effective, and no s/s of distress are evident at this time.  Bed mobility and ADL care are performed with complete staff assistance and without complication. Patient takes medications IV well.  Over bed table and call light is within reach.  Side rails are up x2.  Patient with non skid footwear on.  TPN infusing without any complications,will continue to monitor.

## 2015-07-25 NOTE — Unmapped (Signed)
 Nursing Clinical Progress Note        Patient Active Problem List    Diagnosis Date Noted   ??? 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 Malnutrition 07/08/2015   ??? Fistula 07/08/2015   ??? Abdominal abscess 07/04/2015       Filed Vitals:    07/24/15 1106 07/24/15 2354 07/25/15 0542 07/25/15 1225   BP: 123/74 110/73 112/71 99/63   Pulse: 85 127 129 119   Temp: 98.2 ??F (36.8 ??C) 98.3 ??F (36.8 ??C) 98.5 ??F (36.9 ??C) 98.5 ??F (36.9 ??C)   TempSrc: Axillary Oral Oral Oral   Resp: 17 20 20 20    Height:       Weight:       SpO2: 96% 100% 97% 100%       Lines/Drains/Access - Yes   If yes, please list  R triple lumen IJ    Does the patient have any changes to their condition warranting intervention? No       RN Shift Note Additional: Patient alert and oriented x 4. Complaints of pain relieved with routine and PRN medications. See flow sheets per assessment. Wound care completed per orders. Ekins pouches to abdomen secured via wound care team. TPN continues per orders. IV antibiotics continue per orders, tolerated well. Bed in low position. Non-slip socks provided. Call light within reach.     Does the patient's have any transfers/appointments or testing scheduled -   No future appointments.    Patient/Family questions - No     MARIE , RN

## 2015-07-25 NOTE — Unmapped (Signed)
Problem: Fall Prevention  Goal: Patient will remain free of falls  Assess and monitor vitals signs, neurological status including level of consciousness and orientation. Reassess fall risk per hospital policy.    Ensure arm band on, uncluttered walking paths in room, adequate room lighting, call light and overbed table within reach, bed in low position, wheels locked, side rails up per policy, and non-skid footwear provided.   Outcome: Progressing  Intervention: Keep bed in low position  Patient in bed, 2/4 side rails up and in place. Non-slip socks on feet as tolerated while in bed. Door to room open as tolerated, fall risk sign in door way. Toileting assistance offered every two hours and as needed. Bed in low position. Call light within reach.

## 2015-07-25 NOTE — Unmapped (Signed)
Problem: Potential for Compromised Skin Integrity  Goal: Skin integrity is maintained or improved  Assess and monitor skin integrity. Identify patients at risk for skin breakdown on admission and per policy. Collaborate with interdisciplinary team and initiate plans and interventions as needed.   Outcome: Progressing  Turn patient, keep skin clean and dry, avoid shearing, encourage use of moisturizer on skin, relieve pressure to bony prominences.

## 2015-07-25 NOTE — Unmapped (Addendum)
Clinical Pharmacy Note - TPN Order    Linda Ponce is a 45 y.o. female that is being followed by the clinical pharmacy department for monitoring and adjustment of TPN.    Patient remains NPO with ice chips, sips of clear    Plan:    -adjusted Calcium is 10.0; no calcium added to TPN  -magnesium has trended down; will increase magnesium slightly to 16 mEq  -BUN up at 35 but improved from previous (was 41-44); macro nutrient change yesterday resulted in increased volume  -BS ranged 104-117 last 24 hrs-can reduce s/s BS to daily per TPN standard protocol  -continue current continuous TPN with above changes  -formulation as follows:      TPN Electrolytes  ??Sodium Chloride: 80 mEq  ??Sodium Acetate: 0 mEq  ??Sodium Phosphate: 0 mmol  ??Magnesium Sulfate: 16 mEq  ??Calcium Gluconate: 0 mEq  ??Potassium Chloride: 20 mEq  ??Potassium Acetate: 0 mEq  ??Potassium Phosphate: 15 mmol???????? TPN Macronutrients  ??Volume: 1675 mL  ??Rate: 69.8 mL/hr  ??Amino Acids: 53 g/L ??  ??Dextrose: 197 g/L ??  ??Lipids: 17 g/L ???????? TPN Additives  ??MVI 10 mL  ??TE 1 mL  ??Ascorbic Acid 200mg  ????????   ??????                                  SODIUM   Date Value Ref Range Status   07/25/2015 139 133 - 146 mmol/L Final     CHLORIDE   Date Value Ref Range Status   07/25/2015 109 98 - 110 mmol/L Final     CO2   Date Value Ref Range Status   07/25/2015 26 21 - 33 mmol/L Final     POTASSIUM   Date Value Ref Range Status   07/25/2015 4.4 3.5 - 5.3 mmol/L Final     PHOSPHORUS   Date Value Ref Range Status   07/25/2015 3.8 2.1 - 4.7 mg/dL Final     CALCIUM   Date Value Ref Range Status   07/25/2015 8.0* 8.6 - 10.3 mg/dL Final     GLUCOSE   Date Value Ref Range Status   07/25/2015 70 70 - 100 mg/dL Final       Cross Jorge E Faisal St[facility] 07/25/2015 9:32 AM

## 2015-07-26 LAB — DIFFERENTIAL
Basophils Absolute: 27 /uL (ref 0–200)
Basophils Relative: 0.2 % (ref 0.0–1.0)
Eosinophils Absolute: 266 /uL (ref 15–500)
Eosinophils Relative: 2 % (ref 0.0–8.0)
Lymphocytes Absolute: 4136 /uL (ref 850–3900)
Lymphocytes Relative: 31.1 % (ref 15.0–45.0)
Monocytes Absolute: 1463 /uL (ref 200–950)
Monocytes Relative: 11 % (ref 0.0–12.0)
Neutrophils Absolute: 7408 /uL (ref 1500–7800)
Neutrophils Relative: 55.7 % (ref 40.0–80.0)
nRBC: 0 /100 WBC (ref 0–0)

## 2015-07-26 LAB — HEPATIC FUNCTION PANEL
ALT: 21 U/L (ref 7–52)
AST: 22 U/L (ref 13–39)
Albumin: <1.5 g/dL (ref 3.5–5.7)
Alkaline Phosphatase: 158 U/L (ref 36–125)
Bilirubin, Direct: 0.16 mg/dL (ref 0.00–0.40)
Bilirubin, Indirect: 0.14 mg/dL (ref 0.00–1.10)
Total Bilirubin: 0.3 mg/dL (ref 0.0–1.5)
Total Protein: 4.6 g/dL (ref 6.4–8.9)

## 2015-07-26 LAB — CBC
Hematocrit: 29.5 % (ref 35.0–45.0)
Hemoglobin: 9.4 g/dL (ref 11.7–15.5)
MCH: 28.8 pg (ref 27.0–33.0)
MCHC: 31.7 g/dL (ref 32.0–36.0)
MCV: 90.9 fL (ref 80.0–100.0)
MPV: 7.1 fL (ref 7.5–11.5)
Platelets: 667 10*3/uL (ref 140–400)
RBC: 3.25 10*6/uL (ref 3.80–5.10)
RDW: 21.1 % (ref 11.0–15.0)
WBC: 13.3 10*3/uL (ref 3.8–10.8)

## 2015-07-26 LAB — POC GLU MONITORING DEVICE
POC Glucose Monitoring Device: 258 mg/dL (ref 70–100)
POC Glucose Monitoring Device: 100 mg/dL (ref 70–100)
POC Glucose Monitoring Device: 126 mg/dL (ref 70–100)

## 2015-07-26 MED ORDER — TPN ADULT (WCH/Drake)
10 | INTRAVENOUS | Status: AC
Start: 2015-07-26 — End: 2015-07-27
  Administered 2015-07-26: 22:00:00 via INTRAVENOUS

## 2015-07-26 MED FILL — FLUCONAZOLE 200 MG/100 ML IN SOD. CHLORIDE (ISO) INTRAVENOUS PIGGYBACK: 200 200 mg/100 mL | INTRAVENOUS | Qty: 100

## 2015-07-26 MED FILL — HYDROMORPHONE 2 MG/ML INJECTION SYRINGE: 2 2 mg/mL | INTRAMUSCULAR | Qty: 2

## 2015-07-26 MED FILL — PIPERACILLIN-TAZOBACTAM 3.375 GRAM INTRAVENOUS SOLUTION: 3.375 3.375 gram | INTRAVENOUS | Qty: 1

## 2015-07-26 MED FILL — VANCOMYCIN 1,000 MG INTRAVENOUS INJECTION: 1000 1000 mg | INTRAVENOUS | Qty: 1000

## 2015-07-26 MED FILL — CLOPIDOGREL 75 MG TABLET: 75 75 mg | ORAL | Qty: 1

## 2015-07-26 MED FILL — TRAVASOL 10 % INTRAVENOUS SOLUTION: 10 10 % | INTRAVENOUS | Qty: 888

## 2015-07-26 NOTE — Unmapped (Signed)
Pt anxious - refused anti anxiety meds  Med x 4 for c/o pain - s/w effective  tpn and ivpb infused w/o difficulty

## 2015-07-26 NOTE — Unmapped (Signed)
Occupational Therapy  [facility]  Occupational Therapy      Name: Linda Ponce  DOB: [birth year]  Attending Physician: Corrinne Eagle, MD  Admission Diagnosis: jewish 07/05/15 dx: sepsis  Date: 07/26/2015    Reviewed Pertinent hospital course: Yes   Hospital Course PT/OT: 66f with history of colorectal CA, initially admitted to [facility] post operatively for continued wound care. Developed bleeding, sent back to [facility] where she was foundto have multiple pelvic abscesses, enterocutaneous fistulas, was kept NPO, started on IV abx, TPN, and returns for continued care. PRECAUTIONS: FULL CODE, AAT, NO WEIGHT BEARING RESTRICTIONS, ISCHEMIC R TOES, MULTIPLE DRAINS FROM ABDOMEN AND BACK       Brief Assessment  Assessment  Assessment: Decreased ADL status, Decreased self-care trans, Decreased Functional Mobility, Decreased activity tolerance  Prognosis: Guarded  Goal Formulation: Other (comment) (per therapist )    Goals  Pt Will demonstrate functional chair transfer:  (tolerate assessment; by July 17, 2015)  Pt Will participate in upper extremity HEP to prep for ADLs: strengthening / activity tolerance for functional use: Ongoing   Miscellaneous Goal #1: Patient will tolerate sitting EOB with maximal assist: By July 17, 2015  Long Term Goal : Patient to be seen 2-3 week trial period to determine if she can participate and benefit from active OT programming  Time frame for goals to be met in: 3 weeks     Recommendation  Plan  Treatment Interventions: Compensatory technique education, Therapeutic Activity, Patient/Family training, Excercise, ADL retraining, Functional transfer training, Activity Tolerance training, Energy Conservation  OT Frequency: 1-3x/wk    Cognition  Overall Cognitive Status: Within Functional Limits    Pain  Pain Score:   9  Pain Location: Abdomen  Pain Descriptors: Aching  Pain Intervention(s): Medication (See eMAR)     Mobility   Pt refused all mobility this date including  repositioning in bed       Exercises  Interventions  Shoulder Free Weights:  (Red T band for Right side; Yellow for left; tied to side rails shld horz abb/abd, shld flex 12 x2 )    Pt refused out of bed this date, sitting EOB and/or repositioning in bed. Pt more tearful than usual and was not easily coaxed into treatment this date. Pt however did agree to exercise program supine; refer above for exercises completed. Pt with increase weakness noted as well as poor activity tolerance; will continue to monitor and adjust accordingly. Pt left supine EOS all needs met and in reach.  Cont per POC in colboration with OTR/L  [name], COTA/L    Patient Education:  HEP for UE's with T band    Time  Start Time: 1305  Stop Time: 1345  Time Calculation (min): 40 min    Charges       $Therapeutic Exercise: 8-22 mins  $Therapeutic Activity: 23-37 mins

## 2015-07-26 NOTE — Unmapped (Signed)
Hospitalist Progress Note    07/26/2015     4:12 PM    Linda Ponce   LOS: 18 days       HPI;    Principal Problem:    Fistula  Active Problems:    Abdominal abscess    Enterocutaneous fistula    Malnutrition      Subjective & Interval History;    Afebrile, hemodynamically stable,   Appears to be in no distress, awake, alert. Pain control slightly better, no excessive sedation noted, no new complaint    Review of Systems:  As above otherwise negative         PMH: Unchanged from H&P.   PSH: Unchanged from H&P.   Family medical history: Unchanged from H&P.   Social history: Unchanged from H&P      Scheduled Meds:  ??? clopidogrel  75 mg Per NG tube Daily 0900   ??? fentaNYL  1 patch Transdermal Q72H   ??? fentaNYL  1 patch Transdermal Q72H   ??? fluconazole in NaCl (iso-osm)  200 mg Intravenous Q24H   ??? insulin regular  0-5 Units Subcutaneous Daily 0900   ??? piperacillin-tazobactam (ZOSYN) IV extended interval  3.375 g Intravenous Q8H   ??? vancomycin  1,000 mg Intravenous Q24H     Continuous Infusions:  ??? TPN ADULT ([facility]) 69.8 mL/hr at 07/25/15 1830   ??? TPN ADULT ([facility])       PRN Meds:ALPRAZolam, alteplase, dextrose, dextrose 50 % in water (D50W), heparin lock flush, HYDROmorphone, ipratropium-albuterol, LORazepam, ondansetron    Objective:    Vital signs in last 24 hours:  Temp:  [97.9 ??F (36.6 ??C)-98.4 ??F (36.9 ??C)] 98.2 ??F (36.8 ??C)  Heart Rate:  [127-138] 127  Resp:  [20-22] 20  BP: (112-117)/(68-83) 112/69 mmHg    Physical Examination:     GENERAL: The patient is awake, alert, oriented?? not in acute cardiorespiratory distress, awakens easily  HEENT:Atraumatic.Normocephalic. PERRLA, EOMI. No icterus, no pallor, no jugular venous distention. ??  NECK: Supple. Trachea is in midline. No Thyromegaly. No Lymphadenopathy.  CHEST: Symmetric. Clear to auscultation, bilateral equal air entry. No wheezing or rhonchi.  CARDIOVASCULAR: S1, S2, Regular pulse, tachy rate. No murmur or gallops. No JVD.  ABDOMEN: Soft, nontender,  nondistended, positive bowel sounds.No viscera palpable.  EXTREMITIES: There is no edema in the lower extremities.?? Pulses are positive in all extremities.Power is 5/5 in all extremities.(+) dry gangrene R foot  ??    Intake/Output last 3 shifts:  I/O last 3 completed shifts:  In: 2990 [P.O.:480; I.V.:222]  Out: 650 [Urine:650]    Recent Labs;  @SLAB @  No results found for: INR, PROTIME    ABG;  No results found for: PHART, PCO2, PO2ART, HCO3ART, BEART, HBO2PER, O2SATART        Last  Culture and Gram Stain;  No results found for: AEROBOT, ANABOT, LABGRAM, ISO2, ISO3, ISO4, ISO5, ISO6, PNAFISH      Diet;    Diet Orders          TPN ADULT ([facility]) at 69.8 mL/hr starting at 04/14 1800    TPN ADULT ([facility]) at 69.8 mL/hr starting at 04/13 1800    Diet NPO effective now Except for: except ice chips, except sips of clears starting at 03/28 1610          Future Appointments:  No future appointments.    Assessment/Plan:      Leukocytosis   - repeat in am   - check urine, lft's  Colon CA   - will discuss with sister, s/p radiation , surgery but no records avail      ?? Abdominal abscess (07/04/2015)  ????????????????????????- continue abx vanc / zosyn?? and diflucan, duration depending on clinical / radiographic improvement.   - wound care   - increased fentanyl patch to 125 mgc/hr total         ????????????????????????  ?? 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   Pager# [phone]    07/26/2015

## 2015-07-26 NOTE — Unmapped (Signed)
Clinical Pharmacy Note - TPN Order    Linda Ponce is a 45 y.o. female that is being followed by the clinical pharmacy department for monitoring and adjustment of TPN.    Patient remains NPO with ice chips, sips of clear    Plan:    -no new TPN labs to review  -BS ranged 104-258 last 24 hrs  -BS s/s coverage switched to daily yesterday as BS were < 140 for 3 consecutive days (per TPN protocol) but will continue to follow  -continue current continuous TPN  -formulation listed below    ????  TPN Electrolytes  ??Sodium Chloride: 80 mEq  ??Sodium Acetate: 0 mEq  ??Sodium Phosphate: 0 mmol  ??Magnesium Sulfate: 16 mEq  ??Calcium Gluconate: 0 mEq  ??Potassium Chloride: 20 mEq  ??Potassium Acetate: 0 mEq  ??Potassium Phosphate: 15 mmol?????????? TPN Macronutrients  ??Volume: 1675 mL  ??Rate: 69.8 mL/hr  ??Amino Acids: 53 g/L ??  ??Dextrose: 197 g/L ??  ??Lipids: 17 g/L ?????????? TPN Additives  ??MVI 10 mL  ??TE 1 mL  ??Ascorbic Acid 200mg  ??????????   ????????                                Akiel Fennell E Matas Burrows [facility] 07/26/2015 9:28 AM

## 2015-07-26 NOTE — Unmapped (Signed)
Fall precautions maintained, call light within reach, patient utilizes call light to ask for assistance as needed.  Arm band is secure, uncluttered walking pathways and adequate room lighting is maintained.  Call light and bed side table are within reach.  Bed is in low position, wheels locked, side rails per policy.  Non skid footwear is provided. Tolerating TPN without difficulty, CBG as rx'd, hung and verified with another RN as rx'd PRN pain meds given with positive effects noted Wound treatment done per rx, patient tolerated well, skin integrity interventions utilized. Nephrostomy tubes noted with amber urine draining,

## 2015-07-26 NOTE — Unmapped (Signed)
Problem: Fall Prevention  Goal: Patient will remain free of falls  Assess and monitor vitals signs, neurological status including level of consciousness and orientation. Reassess fall risk per hospital policy.    Ensure arm band on, uncluttered walking paths in room, adequate room lighting, call light and overbed table within reach, bed in low position, wheels locked, side rails up per policy, and non-skid footwear provided.   Outcome: Progressing  Fall precautions maintained, call light within reach, patient utilizes call light to ask for assistance as needed.  Arm band is secure, uncluttered walking pathways and adequate room lighting is maintained.  Call light and bed side table are within reach.  Bed is in low position, wheels locked, side rails per policy.  Non skid footwear is provided.

## 2015-07-26 NOTE — Unmapped (Signed)
Physical Therapy                                              Physical Therapy Treatment     Name: Linda Ponce  DOB: [birth year]  Attending Physician: Corrinne Eagle, MD  Admission Diagnosis: jewish 07/05/15 dx: sepsis  Date: 07/26/2015  Reviewed Pertinent hospital course: Yes  Hospital Course PT/OT: 8f with history of colorectal CA, initially admitted to [facility] post operatively for continued wound care. Developed bleeding, sent back to [facility] where she was foundto have multiple pelvic abscesses, enterocutaneous fistulas, was kept NPO, started on IV abx, TPN, and returns for continued care. PRECAUTIONS: FULL CODE, AAT, NO WEIGHT BEARING RESTRICTIONS, ISCHEMIC R TOES, MULTIPLE DRAINS FROM ABDOMEN AND BACK     Assessment     Assessment: Impaired Bed Mobility, Impaired Transfer Mobility, Impaired Ambulation, Impaired Balance, Deconditioning, Impaired Strength, Impaired ROM  Prognosis: Guarded (due to multiple medical issues )    Goals  Pt Will Go Supine To Sit: Minimal (to get in and out of bed within 4 weeks. )  Sit To Stand: Min A with LRAD to prepare for gait and transfers within 6 weeks.   Pt Will Transfer Bed/Chair: Minimal (with LRAD to increase independence in home within 6 weeks. )  Pt Will Ambulate: Minimal (25' with RW to access home within 6 weeks. )  Miscellaneous Goal #1: Pt will propel w/c 150' with B UEs with supervision to increase independence in home and community within 6 weeks.   Long Term Goal : Pt will be independent with HEP to improve B LE strength to facilitate safe mobility within 8 weeks.   Time frame for goals to be met in: 8 weeks, 09/14/15    Recommendation  Plan  Treatment/Interventions: LE strengthening/ROM, Endurance training, Patient/family training, Equipment eval/education, Museum/gallery curator, Therapeutic Exercise, Therapeutic Activity, Continued evaluation, Wheelchair Mobility, Compensatory technique education, Neuromuscular Reeducation, Gait training  PT Frequency:  (2-4x/wk  )    Recommendation  Recommendation: Short-term skilled PT  Equipment Recommended: Defer at this time    Problem List  Patient Active Problem List   Diagnosis   ??? Abdominal abscess   ??? Enterocutaneous fistula   ??? Malnutrition   ??? Fistula        Past Medical History  Past Medical History   Diagnosis Date   ??? Cancer    ??? Abscess of abdominal cavity    ??? Gangrene of foot    ??? AKI (acute kidney injury)    ??? Fistula    ??? Peritonitis and retroperitoneal infections    ??? Cachexia    ??? Hyperkalemia    ??? Dehydration    ??? Encephalopathies    ??? Ischemic foot         Past Surgical History  Past Surgical History   Procedure Laterality Date   ??? Colon surgery         Cognition:  Overall Cognitive Status: Within Functional Limits  Orientation Level: Oriented X4     Pain:  Pain Score:   9  Pain Location: Abdomen  Pain Descriptors: Aching       Mobility:    Refused this date.       Exercise:  Supine  Supine-Exercise Comments: SAQ: 0# 2x5     Stretching: B DF/PF 10x3, B Hamstring: 10x3  PROM: B Knee Ext x8ea  Patient Education  Benefits of OOB. POC.     Pt supine in bed at start of session. Pt refused OOB activity and became tearful. Pt stated It hurts too much. I don't want to keep doing this. Pt agreeable to light stretching and PROM to improve functional mobility. TherEx complete to increase B knee ext. Pt supine in bed w/all needs met and in reach at end of session.     Time  Start Time: 1305  Stop Time: 1330  Time Calculation (min): 25 min    Charges       $Therapeutic Exercise:  $Therapeutic Activity:    Continue POC and collaboration w/PT.    Rudean Hitt, PTA [phone]

## 2015-07-26 NOTE — Unmapped (Signed)
Problem: Fall Prevention  Goal: Patient will remain free of falls  Assess and monitor vitals signs, neurological status including level of consciousness and orientation. Reassess fall risk per hospital policy.    Ensure arm band on, uncluttered walking paths in room, adequate room lighting, call light and overbed table within reach, bed in low position, wheels locked, side rails up per policy, and non-skid footwear provided.   Intervention: Keep call light within reach  Call light and bedside table within reach

## 2015-07-27 LAB — VANCOMYCIN, TROUGH: Vancomycin Tr: 14.9 ug/mL (ref 10.0–20.0)

## 2015-07-27 LAB — POC GLU MONITORING DEVICE
POC Glucose Monitoring Device: 120 mg/dL (ref 70–100)
POC Glucose Monitoring Device: 113 mg/dL (ref 70–100)
POC Glucose Monitoring Device: 129 mg/dL (ref 70–100)
POC Glucose Monitoring Device: 106 mg/dL — ABNORMAL HIGH (ref 70–100)

## 2015-07-27 MED ORDER — TPN ADULT (WCH/Drake)
3 | INTRAVENOUS | Status: AC
Start: 2015-07-27 — End: 2015-07-28
  Administered 2015-07-27: 21:00:00 via INTRAVENOUS

## 2015-07-27 MED FILL — FLUCONAZOLE 200 MG/100 ML IN SOD. CHLORIDE (ISO) INTRAVENOUS PIGGYBACK: 200 200 mg/100 mL | INTRAVENOUS | Qty: 100

## 2015-07-27 MED FILL — HYDROMORPHONE 2 MG/ML INJECTION SYRINGE: 2 2 mg/mL | INTRAMUSCULAR | Qty: 2

## 2015-07-27 MED FILL — VANCOMYCIN 1,000 MG INTRAVENOUS INJECTION: 1000 1000 mg | INTRAVENOUS | Qty: 1000

## 2015-07-27 MED FILL — CLOPIDOGREL 75 MG TABLET: 75 75 mg | ORAL | Qty: 1

## 2015-07-27 MED FILL — TRAVASOL 10 % INTRAVENOUS SOLUTION: 10 10 % | INTRAVENOUS | Qty: 888

## 2015-07-27 MED FILL — FENTANYL 100 MCG/HR TRANSDERMAL PATCH: 100 100 mcg/hr | TRANSDERMAL | Qty: 1

## 2015-07-27 MED FILL — PIPERACILLIN-TAZOBACTAM 3.375 GRAM INTRAVENOUS SOLUTION: 3.375 3.375 gram | INTRAVENOUS | Qty: 1

## 2015-07-27 MED FILL — ONDANSETRON HCL (PF) 4 MG/2 ML INJECTION SOLUTION: 4 4 mg/2 mL | INTRAMUSCULAR | Qty: 2

## 2015-07-27 NOTE — Unmapped (Signed)
Pharmacy Nutrition Support Service    Linda Ponce is a 45 y.o. female on TPN.  Pharmacy was consulted for monitoring and adjustment.    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    Pt Weights:  39.8 kg (07/27/15)    42.8 kg (07/21/15)    Estimated Nutritional Needs: Per Dietary on 07/09/15   Based on Weight 45.6 kg   Amino Acids: 55-65 grams (1.2-1.4 grams/kg/day)   Fluid: 1550 mL/day   Total Daily Calories: 1500-1565 kcal (33-34 kcal/kg/day)    Recommended Macronutrients in TPN: Per Dietary on 07/23/15   Based on Weight 45.4 kg (100#)   Total Volume: 1675 mL   Rate: 69.8 mL/h   Amino Acids: 80 g/day   Dextrose: 295 g/day   Lipids: 25 g/day   Total Daily Calories: 1575 kcal/day (35 kcal/kg)     TPN Formula: *Macronutrients increased on 4/12 due to weight loss.   Based on current weight of 39.8 kg   Amino Acids: 53 g/L (88.8 grams, 2.2 grams/kg/day, 355 kcal)   Dextrose: 197 g/L (330 grams, 1121 kcal)   Lipids: 17 g/L (28.5 grams, 285 kcal)   Total Daily Calories: 1761 kcal (44 kcal/kg/day)   Volume: 1675 mL   Rate: 69.8 mL/h    TPN Medication Recent History (Show up to 1 orders; newest on the left.)     Start date and time   07/26/2015 1800      TPN ADULT ([facility]) [[id number]]    Order Status  Active    Last Given  07/26/2015 1801       Macro Ingredients    amino acid 10%  53 g/L    dextrose 70%  197 g/L       QS Base    sterile water  170.8 mL       Lipids    fat emulsion 30 %  17 g/L       TPN Electrolytes entered as PER DAY amount    sodium chloride  80 mEq    magnesium sulfate  16 mEq    potassium chloride  20 mEq    potassium phosphate  15 mmol       TPN Additives entered as PER DAY amount    MVI adult no. 1 with vitamin K  10 mL    ascorbic acid (vitamin C)  200 mg    trace elements (MULTITRACE-5)  1 mL       Calorie Contribution    Proteins  355.2 kcal    Dextrose  1,120.98 kcal    Lipids  284.7 kcal    Total  1,760.88 kcal       Electrolyte Ion Calculated Amount    Sodium  80  mEq    Potassium  42 mEq    Calcium  --    Magnesium  15.83 mEq    Aluminum  --    Phosphate  15 mmol    Chloride  100 mEq    Acetate  --       Other    Total Protein  88.8 g    Total Protein/kg  2.07 g/kg    Glucose Infusion Rate  5.35 mg/kg/min    Osmolarity  1,690.29    Volume  1,675 mL    Rate  69.8 mL/hr    Dosing Weight  42.8 kg    Infusion Site  Central        Diet Orders  TPN ADULT ([facility]) at 69.8 mL/hr starting at 04/14 1800    Diet NPO effective now Except for: except ice chips, except sips of clears starting at 03/28 0959          TPN Associated Medications:    - GI Prophylaxis: None   - Antiemetic: ondansetron PRN   - Bowel Regimen: none   - Insulin Regimen: sliding scale regular insulin     Recent Labs:      Lab 07/25/15  0626 07/22/15  0604   SODIUM 139 139   POTASSIUM 4.4 4.7   CHLORIDE 109 105   CO2 26 27   BUN 35* 44*   CREATININE 0.54* 0.79   GLUCOSE 70 78   CALCIUM 8.0* 8.2*   PHOSPHORUS 3.8 4.7   MAGNESIUM 1.9 2.3         Lab 07/26/15  0746 07/25/15  0626 07/22/15  0604   ALK PHOS 158*  --  166*   ALT 21  --  29   AST 22  --  22   BILIRUBIN TOTAL 0.3  --  0.3   BILIRUBIN DIRECT 0.16  --  0.07   TOTAL PROTEIN 4.6*  --  5.1*   ALBUMIN <1.5* 1.5* 1.6*   1.6*         Lab 07/22/15  0604   TRIGLYCERIDES 125           Assessment and Plan:    - Formulation: Continue same mamcronutrients and electrolytes from yesterday. No new labs today. Will discuss macros with dietary on Monday; macros were increased 4/12 due to weight loss, new formulation differs slightly from dietary's recommendations on 4/11.  - Multivitamin/Trace Elements: Multivitamins and trace elements will be provided daily.   - Other additives: ascorbic acid 200 mg added to each bag considering GI output and wound healing.  - Enteral Nutrition: NPO except for sips of clears.  - Check labs twice weekly: Monday/Thursday.    Thank you for the consult.    Kristie Cowman, PharmD  07/27/2015 9:07 AM

## 2015-07-27 NOTE — Unmapped (Signed)
Assessment completed call light within reach fistula care atb pain maagement continue

## 2015-07-27 NOTE — Unmapped (Addendum)
Clinical Pharmacy Service - Vancomycin Progress Note    Linda Ponce is a 45 y.o. female being treated with IV Vancomycin for feculent peritonitis (E faecium - amp R and E faecalis)    Assessment/Plan:   ?? Goal vancomycin trough is 10-15 mg/L, recent trough at goal, continue current dose, check trough every Monday  ?? MD to clarify duration of vancomycin    Subjective/Objective Information:    Anticipated Stop Date: TBD    Antibiotics Ordered     Current Anti-Infectives       Dose Frequency Start End    fluconazole (DIFLUCAN) 200 mg IV in NaCl (iso-osm) 100 mL 200 mg Every 24 hours 07/08/2015     Route: Intravenous    piperacillin-tazobactam (ZOSYN) 3.375 g in sodium chloride 0.9% 100 mL ADV IVPB 3.375 g Every 8 hours 07/08/2015     Route: Intravenous    vancomycin (VANCOCIN) 1,000 mg in sodium chloride 0.9 % 250 mL IVPB 1,000 mg Every 24 hours 07/24/2015     Route: Intravenous         documented within (last 72 hours)     Date/Time Action Medication Dose Rate    07/26/15 1014 New Bag    vancomycin (VANCOCIN) 1,000 mg in sodium chloride 0.9 % 250 mL IVPB 1,000 mg 250 mL/hr    07/25/15 0841 New Bag    vancomycin (VANCOCIN) 1,000 mg in sodium chloride 0.9 % 250 mL IVPB 1,000 mg 250 mL/hr        Labs      Vanc Tr      14.9 07/27/15 0900    11.4 07/22/15 0604               WBC, BUN, Creatinine     WBC   BUN   Creatinine      13.3 (!) 07/26/15 0746 35 (!) 07/25/15 0626 0.54 (!) 07/25/15 0626    11.4 (!) 07/25/15 0626 44 (!) 07/22/15 0604 0.79 07/22/15 0604    6.6 07/22/15 0604           I/O last 3 completed shifts:  In: 6506 [I.V.:3989; IV Piggyback:200]  Out: 3550 [Urine:1200; Stool:2350]  Microbiology     Microbiology Results     No orders found from 07/21/2015 to 07/28/2015.          Pharmacy will continue to follow for the monitoring of therapy efficacy and toxicity, please call if you have any questions. Thank you for the consult.     Andree Elk, [facility]   07/27/2015 12:38 PM

## 2015-07-27 NOTE — Unmapped (Signed)
Patient resting in bed; prn medication given per patient's request; effective; continues on antibiotic and TPN therapy; drains and ostomy remains intact; call light in reach; visual  checks during the shift.

## 2015-07-27 NOTE — Unmapped (Signed)
Problem: Knowledge Deficit  Goal: Patient/family/caregiver demonstrates understanding of disease process, treatment plan, medications, and discharge instructions  Complete learning assessment and assess knowledge base.   Educate pt on turning repositioning

## 2015-07-27 NOTE — Unmapped (Signed)
[place]  DEPARTMENT OF INTERNAL MEDICINE  DAILY PROGRESS NOTE    Chief Complaint / Reason for Follow-Up     Linda Ponce is a 45 y.o. female on hospital day 71.  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    Interval History / Subjective     Subjective  Pain remains uncontrolled when patient is awake, regularly requesting dilaudid then sleeping when she is able to tolerate the pain  Continues to decline most care other than abx, wound care, and pain medicaation  Patient unwilling to take PO medications due to pain  Continue to monitor Vanco levels    Past medical, family, and social histories were reviewed as previously documented. Updates were made as necessary.    Review of Systems     Review of Systems As above    Medications     Scheduled Meds:  ??? clopidogrel  75 mg Per NG tube Daily 0900   ??? fentaNYL  1 patch Transdermal Q72H   ??? fentaNYL  1 patch Transdermal Q72H   ??? fluconazole in NaCl (iso-osm)  200 mg Intravenous Q24H   ??? insulin regular  0-5 Units Subcutaneous Daily 0900   ??? piperacillin-tazobactam (ZOSYN) IV extended interval  3.375 g Intravenous Q8H   ??? vancomycin  1,000 mg Intravenous Q24H     Continuous Infusions:  ??? TPN ADULT ([facility]) 69.8 mL/hr at 07/26/15 1801   ??? TPN ADULT ([facility])       PRN Meds:ALPRAZolam, alteplase, dextrose, dextrose 50 % in water (D50W), heparin lock flush, HYDROmorphone, ipratropium-albuterol, LORazepam, ondansetron    Vital Signs     Temp:  [98.1 ??F (36.7 ??C)-98.3 ??F (36.8 ??C)] 98.1 ??F (36.7 ??C)  Heart Rate:  [118-132] 118  Resp:  [18-22] 22  BP: (102-110)/(59-72) 104/59 mmHg  FiO2:  [21 %] 21 %    Intake/Output Summary (Last 24 hours) at 07/27/15 1639  Last data filed at 07/27/15 1300   Gross per 24 hour   Intake   5490 ml   Output   3555 ml   Net   1935 ml       Physical Exam     Physical Exam  GENERAL: Cachectic AA female asleep but upon awakening oriented and tearful due to pain  HEENT:Atraumatic.Normocephalic. PERRLA, EOMI. No icterus, no  pallor, no jugular venous distention.  NECK: Supple. Trachea is in midline.?? No bruits, no lymphadenopathy  CHEST: Tunneled internal jugular catheter right side. PAC right chest. Symmetric. Clear to auscultation, bilateral equal air entry. No wheezing or rhonchi.  CARDIOVASCULAR: S1, S2, Regular pulse. No murmur or gallops. No JVD.  ABDOMEN: Soft.?? Unable to palpate due to multiple wounds etc. LLQ pigtail drain draining serosanguinous fluid. Nephrostomy tubes both flanks draining yellow urine. Foley with scant dark urine. Wound LLQ nondraining. WOstomy vs wound with appliance in place RLQ. Large midabdominal wound pouch to drainage, two fistulae contained within draining bilious appearing enteric fluid.  EXTREMITIES: generalized edema. Extremities warm. Necrotic, gangrenous toes, excruciatingly painful. No clubbing. Unable to assess BLE pulses   Laboratory Data         Lab 07/26/15  0746 07/25/15  0626 07/22/15  0604   WBC 13.3* 11.4* 6.6   HEMOGLOBIN 9.4* 9.7* 19.1*   HEMATOCRIT 29.5* 30.3* 59.6*   MEAN CORPUSCULAR VOLUME 90.9 89.8 89.8   PLATELETS 667* 677* 269         Lab 07/25/15  0626 07/22/15  0604   SODIUM 139 139   POTASSIUM  4.4 4.7   CHLORIDE 109 105   CO2 26 27   BUN 35* 44*   CREATININE 0.54* 0.79   GLUCOSE 70 78   CALCIUM 8.0* 8.2*   MAGNESIUM 1.9 2.3   PHOSPHORUS 3.8 4.7             Lab 07/26/15  0746 07/25/15  0626 07/22/15  0604   ALT 21  --  29   AST 22  --  22   ALK PHOS 158*  --  166*   BILIRUBIN TOTAL 0.3  --  0.3   BILIRUBIN DIRECT 0.16  --  0.07   ALBUMIN <1.5* 1.5* 1.6*   1.6*      07/05/2015  07/09/2015    Prealbumin 14.0 (L) 16.0 (L)     Results for Linda Ponce, Linda Ponce (MRN [id number]) as of 07/27/2015 16:38   07/08/2015 20:05 07/13/2015 04:37 07/18/2015 05:18 07/22/2015 06:04 07/27/2015 09:00   Vancomycin Tr 20.2 (H) 16.1 17.5 11.4 14.9       Assessment & Plan     ?? Peritoneal abscess- Mixed flora, pigtail drain 06/26/15 Refused followup CT, still requires one. ID consult- Vanco/Zosyn/Diflucan. WBC slowly  increasing, recheck tomorrow, tmax 99.1  ?? Fistula- wound care, bowel rest  ?? Severe intractable acute on chronic pain syndrome- receiving Dilaudid IV BID prn, fentanyl patch dose increased  ?? Thrombocytopenia- uncertain etiology- follow closely. Per ID at [facility], resolved. May have been heparin related  ?? Cachexia-???? continue TPN, recheck PAB  ?? Sepsis resolved  ?? Ischemic feet/arterial thromboemboli- refusing heparin, aspirin and Plavix. Refused all heparin injections previously as well. Will cancel aspirin but continue to encourage plavix.. Likely will need amputation  ?? History of colon cancer s/p exenteration with bilateral nephrostomy tubes  ?? Recurrent presacral abscesses  ?? Debility- PT/OT      Nutrition:   Diet Orders          TPN ADULT ([facility]) at 69.8 mL/hr starting at 04/15 1800    TPN ADULT ([facility]) at 69.8 mL/hr starting at 04/14 1800    Diet NPO effective now Except for: except ice chips, except sips of clears starting at 03/28 0959          Code Status: Full Code    Signed:  Regina Eck, MD  07/27/2015, 4:39 PM

## 2015-07-27 NOTE — Unmapped (Signed)
Problem: Fall Prevention  Goal: Patient will remain free of falls  Assess and monitor vitals signs, neurological status including level of consciousness and orientation. Reassess fall risk per hospital policy.    Ensure arm band on, uncluttered walking paths in room, adequate room lighting, call light and overbed table within reach, bed in low position, wheels locked, side rails up per policy, and non-skid footwear provided.   Outcome: Progressing  Patient's call light and overbed table within reach; armband present; adequate lighting; bed in low position; HOB elevated; brakes presently on; visual checks frequently throughout the shift.

## 2015-07-28 LAB — POC GLU MONITORING DEVICE: POC Glucose Monitoring Device: 113 mg/dL (ref 70–100)

## 2015-07-28 MED ORDER — TPN ADULT (WCH/Drake)
10 | INTRAVENOUS | Status: AC
Start: 2015-07-28 — End: 2015-07-29
  Administered 2015-07-28: 22:00:00 via INTRAVENOUS

## 2015-07-28 MED FILL — FENTANYL 25 MCG/HR TRANSDERMAL PATCH: 25 25 mcg/hr | TRANSDERMAL | Qty: 1

## 2015-07-28 MED FILL — HYDROMORPHONE 2 MG/ML INJECTION SYRINGE: 2 2 mg/mL | INTRAMUSCULAR | Qty: 2

## 2015-07-28 MED FILL — PIPERACILLIN-TAZOBACTAM 3.375 GRAM INTRAVENOUS SOLUTION: 3.375 3.375 gram | INTRAVENOUS | Qty: 1

## 2015-07-28 MED FILL — TRAVASOL 10 % INTRAVENOUS SOLUTION: 10 10 % | INTRAVENOUS | Qty: 888

## 2015-07-28 MED FILL — VANCOMYCIN 1,000 MG INTRAVENOUS INJECTION: 1000 1000 mg | INTRAVENOUS | Qty: 1000

## 2015-07-28 MED FILL — FLUCONAZOLE 200 MG/100 ML IN SOD. CHLORIDE (ISO) INTRAVENOUS PIGGYBACK: 200 200 mg/100 mL | INTRAVENOUS | Qty: 100

## 2015-07-28 MED FILL — CLOPIDOGREL 75 MG TABLET: 75 75 mg | ORAL | Qty: 1

## 2015-07-28 NOTE — Unmapped (Signed)
Clinical Pharmacy Service - TPN Progress Note    Linda Ponce is a 45 y.o. female followed by the pharmacy department for monitoring and adjustment of TPN    TPN Formula  Rate: 69.8 mL/hr  Total volume:  AA provided: 53 g/L ??  Dextrose provided: 197 g/L ??  Lipids provided: 17 g/L    Plan:    1. Continue current TPN. No new labs to review.   2. Pharmacy will continue to monitor.    JUSTIN L BOONE 07/28/2015 8:11 AM

## 2015-07-28 NOTE — Unmapped (Signed)
[place]  DEPARTMENT OF INTERNAL MEDICINE  DAILY PROGRESS NOTE    Chief Complaint / Reason for Follow-Up     Linda Ponce is a 45 y.o. female on hospital day 20.  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    Interval History / Subjective     Subjective  Pain remains uncontrolled when patient is awake, regularly requesting dilaudid then sleeping when she is able to tolerate the pain  Continues to decline most care other than abx, wound care, and pain medicaation  Patient unwilling to take PO medications due to pain  Continue to monitor Vanco levels    Past medical, family, and social histories were reviewed as previously documented. Updates were made as necessary.    Review of Systems     Review of Systems As above    Medications     Scheduled Meds:  ??? clopidogrel  75 mg Per NG tube Daily 0900   ??? fentaNYL  1 patch Transdermal Q72H   ??? fentaNYL  1 patch Transdermal Q72H   ??? fluconazole in NaCl (iso-osm)  200 mg Intravenous Q24H   ??? insulin regular  0-5 Units Subcutaneous Daily 0900   ??? piperacillin-tazobactam (ZOSYN) IV extended interval  3.375 g Intravenous Q8H   ??? vancomycin  1,000 mg Intravenous Q24H     Continuous Infusions:  ??? TPN ADULT ([facility]) 69.8 mL/hr at 07/27/15 1715   ??? TPN ADULT ([facility])       PRN Meds:ALPRAZolam, alteplase, dextrose, dextrose 50 % in water (D50W), heparin lock flush, HYDROmorphone, ipratropium-albuterol, LORazepam, ondansetron    Vital Signs     Temp:  [98 ??F (36.7 ??C)-98.6 ??F (37 ??C)] 98 ??F (36.7 ??C)  Heart Rate:  [82-129] 122  Resp:  [16-20] 19  BP: (106-154)/(65-70) 106/65 mmHg  FiO2:  [21 %] 21 %    Intake/Output Summary (Last 24 hours) at 07/28/15 1147  Last data filed at 07/28/15 0533   Gross per 24 hour   Intake   2543 ml   Output      5 ml   Net   2538 ml       Physical Exam     Physical Exam  GENERAL: Cachectic AA female asleep but upon awakening oriented and tearful due to pain  HEENT:Atraumatic.Normocephalic. PERRLA, EOMI. No icterus, no pallor, no  jugular venous distention.  NECK: Supple. Trachea is in midline.?? No bruits, no lymphadenopathy  CHEST: Tunneled internal jugular catheter right side. PAC right chest. Symmetric. Clear to auscultation, bilateral equal air entry. No wheezing or rhonchi.  CARDIOVASCULAR: S1, S2, Regular pulse. No murmur or gallops. No JVD.  ABDOMEN: Soft.?? Unable to palpate due to multiple wounds etc. LLQ pigtail drain draining serosanguinous fluid. Nephrostomy tubes both flanks draining yellow urine. Foley with scant dark urine. Wound LLQ nondraining. WOstomy vs wound with appliance in place RLQ. Large midabdominal wound pouch to drainage, two fistulae contained within draining bilious appearing enteric fluid.  EXTREMITIES: generalized edema. Extremities warm. Necrotic, gangrenous toes, excruciatingly painful. No clubbing. Unable to assess BLE pulses   Laboratory Data         Lab 07/26/15  0746 07/25/15  0626 07/22/15  0604   WBC 13.3* 11.4* 6.6   HEMOGLOBIN 9.4* 9.7* 19.1*   HEMATOCRIT 29.5* 30.3* 59.6*   MEAN CORPUSCULAR VOLUME 90.9 89.8 89.8   PLATELETS 667* 677* 269         Lab 07/25/15  0626 07/22/15  0604   SODIUM 139 139  POTASSIUM 4.4 4.7   CHLORIDE 109 105   CO2 26 27   BUN 35* 44*   CREATININE 0.54* 0.79   GLUCOSE 70 78   CALCIUM 8.0* 8.2*   MAGNESIUM 1.9 2.3   PHOSPHORUS 3.8 4.7             Lab 07/26/15  0746 07/25/15  0626 07/22/15  0604   ALT 21  --  29   AST 22  --  22   ALK PHOS 158*  --  166*   BILIRUBIN TOTAL 0.3  --  0.3   BILIRUBIN DIRECT 0.16  --  0.07   ALBUMIN <1.5* 1.5* 1.6*   1.6*      07/05/2015  07/09/2015    Prealbumin 14.0 (L) 16.0 (L)     Results for Linda Ponce, Linda Ponce (MRN [id number]) as of 07/27/2015 16:38   07/08/2015 20:05 07/13/2015 04:37 07/18/2015 05:18 07/22/2015 06:04 07/27/2015 09:00   Vancomycin Tr 20.2 (H) 16.1 17.5 11.4 14.9       Assessment & Plan     ?? Peritoneal abscess- Mixed flora, pigtail drain 06/26/15 Refused followup CT, still requires one. ID consult- Vanco/Zosyn/Diflucan. WBC slowly increasing,  recheck tomorrow, tmax 99.1  ?? Fistula- wound care, bowel rest  ?? Severe intractable acute on chronic pain syndrome- receiving Dilaudid IV BID prn, fentanyl patch dose increased  ?? Thrombocytopenia- uncertain etiology- follow closely. Per ID at [facility], resolved. May have been heparin related  ?? Cachexia-???? continue TPN, recheck PAB  ?? Sepsis resolved  ?? Ischemic feet/arterial thromboemboli- refusing heparin, aspirin and Plavix. Refused all heparin injections previously as well. Will cancel aspirin but continue to encourage plavix.. Likely will need amputation  ?? History of colon cancer s/p exenteration with bilateral nephrostomy tubes  ?? Recurrent presacral abscesses  ?? Debility- PT/OT      Nutrition:   Diet Orders          TPN ADULT ([facility]) at 69.8 mL/hr starting at 04/16 1800    TPN ADULT ([facility]) at 69.8 mL/hr starting at 04/15 1800    Diet NPO effective now Except for: except ice chips, except sips of clears starting at 03/28 0959          Code Status: Full Code    Signed:  Regina Eck, MD  07/28/2015, 11:47 AM

## 2015-07-28 NOTE — Unmapped (Signed)
Patient resting in bed; no distress presently; prn medication given per patient's request; effective; repositioned; drains and tubes remain patent and draining without difficulty; call light in reach; visual checks throughout the shift.

## 2015-07-28 NOTE — Unmapped (Signed)
Problem: Knowledge Deficit  Goal: Patient/family/caregiver demonstrates understanding of disease process, treatment plan, medications, and discharge instructions  Complete learning assessment and assess knowledge base.   Educate to ltac

## 2015-07-28 NOTE — Unmapped (Signed)
Problem: Fall Prevention  Goal: Patient will remain free of falls  Assess and monitor vitals signs, neurological status including level of consciousness and orientation. Reassess fall risk per hospital policy.    Ensure arm band on, uncluttered walking paths in room, adequate room lighting, call light and overbed table within reach, bed in low position, wheels locked, side rails up per policy, and non-skid footwear provided.   Outcome: Progressing  Patient's call light and overbed table within reach; armband present; adequate lighting; bed in low position; HOB elevated; brakes presently on; visual checks frequently throughout the shift.

## 2015-07-28 NOTE — Unmapped (Signed)
Assessment completed call light within reach continues pain management atb continue fistula magement

## 2015-07-29 LAB — RENAL FUNCTION PANEL W/EGFR
Albumin: <1.5 g/dL (ref 3.5–5.7)
Anion Gap: 9 mmol/L (ref 3–16)
BUN: 30 mg/dL (ref 7–25)
CO2: 26 mmol/L (ref 21–33)
Calcium: 7.6 mg/dL (ref 8.6–10.3)
Chloride: 107 mmol/L (ref 98–110)
Creatinine: 0.43 mg/dL (ref 0.60–1.30)
Glucose: 75 mg/dL (ref 70–100)
Osmolality, Calculated: 299 mOsm/kg (ref 278–305)
Phosphorus: 3.5 mg/dL (ref 2.1–4.7)
Potassium: 4.1 mmol/L (ref 3.5–5.3)
Sodium: 142 mmol/L (ref 133–146)
eGFR AA CKD-EPI: >90 See note.
eGFR NONAA CKD-EPI: >90 See note.

## 2015-07-29 LAB — HEPATIC FUNCTION PANEL
ALT: 22 U/L (ref 7–52)
AST: 22 U/L (ref 13–39)
Albumin: <1.5 g/dL (ref 3.5–5.7)
Alkaline Phosphatase: 181 U/L (ref 36–125)
Bilirubin, Direct: 0.06 mg/dL (ref 0.00–0.40)
Bilirubin, Indirect: 0.24 mg/dL (ref 0.00–1.10)
Total Bilirubin: 0.3 mg/dL (ref 0.0–1.5)
Total Protein: 4.6 g/dL (ref 6.4–8.9)

## 2015-07-29 LAB — CBC
Hematocrit: 27.6 % (ref 35.0–45.0)
Hemoglobin: 9 g/dL (ref 11.7–15.5)
MCH: 30.3 pg (ref 27.0–33.0)
MCHC: 32.6 g/dL (ref 32.0–36.0)
MCV: 92.8 fL (ref 80.0–100.0)
MPV: 7 fL (ref 7.5–11.5)
Platelets: 602 10*3/uL (ref 140–400)
RBC: 2.98 10*6/uL (ref 3.80–5.10)
RDW: 21.4 % (ref 11.0–15.0)
WBC: 9.1 10*3/uL (ref 3.8–10.8)

## 2015-07-29 LAB — BASIC METABOLIC PANEL
Anion Gap: 9 mmol/L (ref 3–16)
BUN: 30 mg/dL (ref 7–25)
CO2: 26 mmol/L (ref 21–33)
Calcium: 7.6 mg/dL (ref 8.6–10.3)
Chloride: 107 mmol/L (ref 98–110)
Creatinine: 0.43 mg/dL (ref 0.60–1.30)
Glucose: 75 mg/dL (ref 70–100)
Osmolality, Calculated: 299 mOsm/kg (ref 278–305)
Potassium: 4.1 mmol/L (ref 3.5–5.3)
Sodium: 142 mmol/L (ref 133–146)
eGFR AA CKD-EPI: >90 See note.
eGFR NONAA CKD-EPI: >90 See note.

## 2015-07-29 LAB — POC GLU MONITORING DEVICE
POC Glucose Monitoring Device: 112 mg/dL (ref 70–100)
POC Glucose Monitoring Device: 108 mg/dL (ref 70–100)

## 2015-07-29 LAB — VANCOMYCIN, TROUGH: Vancomycin Tr: 10.8 ug/mL (ref 10.0–20.0)

## 2015-07-29 LAB — TRIGLYCERIDES: Triglycerides: 114 mg/dL (ref 10–149)

## 2015-07-29 LAB — MAGNESIUM: Magnesium: 1.9 mg/dL (ref 1.5–2.5)

## 2015-07-29 LAB — PHOSPHORUS: Phosphorus: 3.5 mg/dL (ref 2.1–4.7)

## 2015-07-29 MED ORDER — TPN ADULT (WCH/Drake)
10 | INTRAVENOUS | Status: AC
Start: 2015-07-29 — End: 2015-07-30
  Administered 2015-07-29: 21:00:00 via INTRAVENOUS

## 2015-07-29 MED FILL — PIPERACILLIN-TAZOBACTAM 3.375 GRAM INTRAVENOUS SOLUTION: 3.375 3.375 gram | INTRAVENOUS | Qty: 1

## 2015-07-29 MED FILL — HYDROMORPHONE 2 MG/ML INJECTION SYRINGE: 2 2 mg/mL | INTRAMUSCULAR | Qty: 2

## 2015-07-29 MED FILL — VANCOMYCIN 1,000 MG INTRAVENOUS INJECTION: 1000 1000 mg | INTRAVENOUS | Qty: 1000

## 2015-07-29 MED FILL — TRAVASOL 10 % INTRAVENOUS SOLUTION: 10 10 % | INTRAVENOUS | Qty: 799

## 2015-07-29 MED FILL — FLUCONAZOLE 200 MG/100 ML IN SOD. CHLORIDE (ISO) INTRAVENOUS PIGGYBACK: 200 200 mg/100 mL | INTRAVENOUS | Qty: 100

## 2015-07-29 NOTE — Unmapped (Signed)
Problem: Fall Prevention  Goal: Patient will remain free of falls  Assess and monitor vitals signs, neurological status including level of consciousness and orientation. Reassess fall risk per hospital policy.    Ensure arm band on, uncluttered walking paths in room, adequate room lighting, call light and overbed table within reach, bed in low position, wheels locked, side rails up per policy, and non-skid footwear provided.   Outcome: Progressing  Patient's call light and overbed table within reach; armband present; adequate lighting; bed in low position; HOB elevated; brakes presently on; visual checks frequently throughout the shift.

## 2015-07-29 NOTE — Unmapped (Signed)
Hospitalist Progress Note    07/29/2015     9:07 AM    Karolina Cervi   LOS: 21 days       HPI;    Patient is s/p OR on 05/31/2015 with [facility] for incision and drainage of complex abdominal wall abscess, rectal EUA, cystourethroscopy, bilateral retrograde pyelograms and removal old stent and placement of left 6x24 firm stent and right ureter embolization, right nephrostomy tube exchange, left nephrostomy tube placement and pelvic drain exchange on 06/06/2015 at a hospital in [place]. She was admitted to [facility] with severe sepsis secondary to peritonitis with stool coming from multiple abdominal wounds. She was seen by Critical Care, general surgery, and ID. General surgery recommended NPO and bowel rest and TPN has been started. She is receiving continued dressing changes and nephrostomy tube care. In addition, she was noted to have gangrene of the right foot and Dr. Donzetta Sprung performed angiogram, thrombectomy, and EIA stent on 06/27/15. Dr. Simeon Craft has evaluated and will follow as outpatient to consider transmetatarsal amputation at a later date. Patient is hemodynamically stable. She was  to [facility] and will need continued wound care, IV Vanc and Zosyn, and TPN.  She was sent back to [facility] on 07/05/2015  with abdominal pain and leukocytosis.CT scan showed multiple abdominal abscesses with large pelvic abscess . Her Hgb was 6.8 in ED and she was transfused 1 unit of blood.               Subjective & Interval History;    Pt examined at the bedside.  Looking tearful.  Not very cooperative in physical exam. C/o of pain and hopelessness about recovery.  Has open wound on anterior abdominal wall with brownish output.  Has BLE edema.  +Anxiety and depression.  On TPN.      Review of Systems:   as above    PMH: Unchanged from H&P.   PSH: Unchanged from H&P.   Family medical history: Unchanged from H&P.   Social history: Unchanged from H&P      Scheduled Meds:  ??? clopidogrel  75 mg Per NG  tube Daily 0900   ??? fentaNYL  1 patch Transdermal Q72H   ??? fentaNYL  1 patch Transdermal Q72H   ??? fluconazole in NaCl (iso-osm)  200 mg Intravenous Q24H   ??? insulin regular  0-5 Units Subcutaneous Daily 0900   ??? piperacillin-tazobactam (ZOSYN) IV extended interval  3.375 g Intravenous Q8H   ??? vancomycin  1,000 mg Intravenous Q24H     Continuous Infusions:  ??? TPN ADULT ([facility]) 69.8 mL/hr at 07/28/15 1801     PRN Meds:ALPRAZolam, alteplase, dextrose, dextrose 50 % in water (D50W), heparin lock flush, HYDROmorphone, ipratropium-albuterol, LORazepam, ondansetron    Objective:    Vital signs in last 24 hours:  FiO2:  [21 %] 21 %    Physical Examination:    GENERAL: The patient is awake, tearful and not in acute cardiorespiratory distress. Looking chronically sick.  HEENT:Atraumatic.Normocephalic. PERRLA, EOMI. No icterus, no pallor, no jugular venous distention..No ear or nose discharge.  NECK: Supple. Trachea is in midline. No Thyromegaly. No Lymphadenopathy.  CHEST: Symmetric. Clear to auscultation, bilateral equal air entry. No wheezing or rhonchi.  CARDIOVASCULAR: S1, S2, Regular pulse. No murmur or gallops. No JVD.  ABDOMEN: Whole anterior abdomen is covered with pouch. Brownish fistula output noted.  EXTREMITIES: There is + BLE edema . Very tender to touch both legs.    Skin;No bruise,No rash.No petechiae.  CNS; CN II-XII grossly intact. No focal neurological deficit. Poor concentration.       Intake/Output last 3 shifts:  I/O last 3 completed shifts:  In: 3123 [I.V.:3123]  Out: 800 [Urine:800]    Recent Labs;                         Lab name 07/25/15  0626 07/26/15  0746 07/29/15  0409   WBC 11.4* 13.3* 9.1   HEMOGLOBIN 9.7* 9.4* 9.0*   HEMATOCRIT 30.3* 29.5* 27.6*   MEAN CORPUSCULAR VOLUME 89.8 90.9 92.8   PLATELETS 677* 667* 602*                                                          Lab name 07/22/15  0604 07/25/15  0626 07/29/15  0409   SODIUM 139 139 142   142   POTASSIUM 4.7 4.4 4.1   4.1   CREATININE  0.79 0.54* 0.43*   0.43*   BUN 44* 35* 30*   30*   CHLORIDE 105 109 107   107   CO2 27 26 26   26    PHOSPHORUS 4.7 3.8 3.5   3.5   MAGNESIUM 2.3 1.9 1.9                            Lab name 07/15/15  0840 07/22/15  0604 07/26/15  0746 07/29/15  0409   AST 93* 22 22 22    ALT 38 29 21 22    BILIRUBIN TOTAL 0.8 0.3 0.3 0.3                          Lab name 07/05/15  0702  07/09/15  0544  07/25/15  0626 07/26/15  0746 07/29/15  0409   PREALBUMIN 14.0*  --  16.0*  --   --   --   --    ALBUMIN  --   < > 1.5*  < > 1.5* <1.5* <1.5*   <1.5*   < > = values in this interval not displayed.                                                                                                 Lab name 07/25/15  0626 07/29/15  0409   CALCIUM 8.0* 7.6*   7.6*   PHOSPHORUS 3.8 3.5   3.5                            Lab name 07/22/15  0604 07/29/15  0409   TRIGLYCERIDES 125 114                          Lab name 07/18/15  0518 07/22/15  0604 07/27/15  0900  VANCOMYCIN TROUGH 17.5 11.4 14.9       Diet;    Diet Orders          TPN ADULT ([facility]) at 69.8 mL/hr starting at 04/16 1800    Diet NPO effective now Except for: except ice chips, except sips of clears starting at 03/28 1610          Future Appointments:  No future appointments.    Assessment/Plan:        1. Peritoneal abscess- Mixed flora, pigtail drain 06/26/15 Refused followup CT, still requires one. ID consult-  Vanco/Zosyn/Diflucan. Trending down now.   2. Enterocutaneous Fistula- wound care, bowel rest. Still has significant output.  3. Severe intractable acute on chronic pain syndrome- receiving Dilaudid IV BID prn, fentanyl patch dose increased  4. Thrombocytosis. uncertain etiology- follow closely. Likely reactive.  5. Cachexia-   continue TPN, recheck PAB. Albumin is trending down.    6. Ischemic feet/arterial thromboemboli- The patient had a clot in her femoral artery right leg that was cleared by vascular surgery.  However the patient ended up with necrosis at her distal  digits which may be a combination of lack of blood flow and the use of pressors. Refusing heparin, aspirin and Plavix. Refused all heparin injections previously as well. Canceled aspirin but continue to encourage Plavix.. Likely will need amputation  7. History of colon cancer s/p exenteration with bilateral nephrostomy tubes  8. Recurrent presacral abscesses  9. Debility- PT/OT  10. Guarded prognosis.     Prophylaxis;  GI Prophylaxis: Pepcid  DVT Prophylaxis: Lovenox     Code Status: Full Code      Willem Klingensmith MD.       07/29/2015

## 2015-07-29 NOTE — Unmapped (Signed)
Patient resting in bed; no distress presently; prn medication given per patient's request; effective; continues on antibiotic and TPN therapy; call light in reach; visual checks during the shift.

## 2015-07-29 NOTE — Unmapped (Signed)
Problem: Knowledge Deficit  Goal: Patient/family/caregiver demonstrates understanding of disease process, treatment plan, medications, and discharge instructions  Complete learning assessment and assess knowledge base.   Educate to exercises

## 2015-07-29 NOTE — Unmapped (Signed)
Pharmacy Vancomycin Monitoring Service    Linda Ponce is a 45 y.o. female on vancomycin. Pharmacy consulted for monitoring and adjustment.    Indication for treatment: peritoneal abscess  Goal trough: 10-15 mg/L  Anticipated stop date: TBD  Other antimicrobials: Piperacillin-tazobactam 3.375 g IV q8h extended infusion       Fluconazole 200 mg IV q24h    Height: 5' 2.5 (158.8 cm)   Wt Readings from Last 3 Encounters:   07/27/15 87 lb 11.2 oz (39.78 kg)   07/04/15 90 lb (40.824 kg)   IBW: 51.3 kg    Pertinent Laboratory Values:     Lab 07/29/15  0409 07/26/15  0746 07/25/15  0626   CREATININE 0.43*   0.43*  --  0.54*   BUN 30*   30*  --  35*   WBC 9.1 13.3* 11.4*     EstCrCl: ~100 mL/min (Cockcroft Gault), lower based on vancomycin clearance    Urine Output:  I/O last 3 completed shifts:  In: 3123 [I.V.:3123]  Out: 800 [Urine:800]    Vital Sign ranges over last 24 hours:  Temp:  [98.5 ??F (36.9 ??C)] 98.5 ??F (36.9 ??C)  Heart Rate:  [115] 115  Resp:  [20] 20  BP: (97)/(60) 97/60 mmHg  FiO2:  [21 %] 21 %    Pertinent Cultures:  Date  Source   Results  06/26/15 Abdominal/pelvic drain Enterococcus faecium (resistant to ampicillin, sensitive to vancomycin)        Enterococcus faecalis (sensitive to ampicillin & vancomycin)        Bacteroides fragilis     Vancomycin trough level: 10.8 mg/L - on vanco 1000 mg IV q24h, drawn 24 hrs post dose    Previous vancomycin level: 14.9 mg/L (07/27/15) - on vanco 1000 mg IV q24h, drawn 23 hrs post dose      Assessment/Plan:  ?? Pt continues on vancomycin 1000 mg IV q24h.  ?? Trough was therapeutic at 10.8 mg/L today (goal 10-15).  ?? Continue current dose.  ?? Next trough ordered for 4/20 at 0830, sooner with change in renal function.  ?? Paged MD to clarify stop dates for vanco, pip-tazo, and fluconazole.  ?? Pharmacy will continue to monitor patient and adjust therapy as indicated.    Thank you for the consult.    Ewel Lona, Pharm.D.    07/29/2015 2:24 PM

## 2015-07-29 NOTE — Unmapped (Signed)
When patient saw Dr. Luan Pulling (surgeon) she came back with an appt with the podiatrist for 4/21.  CM cancelled the podiatry appt and scheduled for patient to see Dr. Luan Pulling 08/06/15 @ 9 am - [facility] location.

## 2015-07-29 NOTE — Unmapped (Signed)
[name]  Nutrition Progress Note        Nutrition Diagnosis:   1.  Altered GI Function - Continues  New Nutrition Diagnosis:  No    Diet: NPO x sips of clears  TPN:  69.8 ml/h = 1675 ml total volume per day, AA 88.8 gram/day, Dextrose 330 gram/day, Lipid 28.5 gram/day = 1761 kcal  PO Intake: 240-360  ml po clear liquids/ice chips per shift    Weight:  (refused weight) (07/28/15 0700)   Pt Weights:   87.11 lb  (39.8 kg) (07/27/15)                        94.4 lb (42.8 kg (07/21/15    Skin Condition: left lateral abd fistula slightly smaller, mid abd wound with fistula some granulation and some non granulation, right lateral hip resolved, stage 3 coccyx improved, vascular areas right foot no significant change (per MD note 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)  Edema: none    Labs:  Lab Results   Component Value Date    CREATININE 0.43* 07/29/2015    CREATININE 0.43* 07/29/2015    BUN 30* 07/29/2015    BUN 30* 07/29/2015    NA 142 07/29/2015    NA 142 07/29/2015    K 4.1 07/29/2015    K 4.1 07/29/2015    CL 107 07/29/2015    CL 107 07/29/2015    CO2 26 07/29/2015    CO2 26 07/29/2015     Lab Results   Component Value Date    MG 1.9 07/29/2015     Lab Results   Component Value Date    OSMOLALITY 299 07/29/2015     Lab Results   Component Value Date    CALCIUM 7.6* 07/29/2015    CALCIUM 7.6* 07/29/2015    PHOS 3.5 07/29/2015    PHOS 3.5 07/29/2015    GLUCOSE 75 07/29/2015    GLUCOSE 75 07/29/2015     No results found for: HGBA1C  Lab Results   Component Value Date    WBC 9.1 07/29/2015    HGB 9.0* 07/29/2015    HCT 27.6* 07/29/2015    MCV 92.8 07/29/2015    PLT 602* 07/29/2015         Component Value Date/Time    POCGMD 113* 07/28/2015 0449    POCGMD 106* 07/27/2015 1624    POCGMD 129* 07/27/2015 1124    POCGMD 113* 07/27/2015 0524    POCGMD 120* 07/26/2015 2325     No results found for: VITD25H      Scheduled Meds:  ??? clopidogrel  75 mg Per NG tube Daily 0900   ??? fentaNYL  1 patch  Transdermal Q72H   ??? fentaNYL  1 patch Transdermal Q72H   ??? fluconazole in NaCl (iso-osm)  200 mg Intravenous Q24H   ??? insulin regular  0-5 Units Subcutaneous Daily 0900   ??? piperacillin-tazobactam (ZOSYN) IV extended interval  3.375 g Intravenous Q8H   ??? vancomycin  1,000 mg Intravenous Q24H     Continuous Infusions:  ??? TPN ADULT ([facility]) 69.8 mL/hr at 07/28/15 1801   ??? TPN ADULT ([facility])       PRN Meds:.ALPRAZolam, alteplase, dextrose, dextrose 50 % in water (D50W), heparin lock flush, HYDROmorphone, ipratropium-albuterol, LORazepam, ondansetron    Note:  Continues on TPN macronutrients adjusted on 07/25/15 and per discussion with Pharmacy to be  Readjusted today to: Total Volume: 1675 mL  Rate: 69.8 mL/h               Amino Acids: 80 g/day               Dextrose: 290 g/day               Lipids: 28 g/day    Calories: 1552   Weights of 4/9 decreased from admission weight and wt of 4/15 decreased further. Pt has refused to be re-weighed on 4/15. Serum Na WNL. BUN elevated but somewhat improved. May be elevated r/t antibiotics.Takes po clear liquids. Serum Osmo is WNL. Likely adequately hydrated. Blood sugars are within adequate range. Continue to monitor weights - current TPN formulation of today's order will provide: 39 kcal/kg. Further increase in dextrose will exceed rec dextrose maximum infusion rate.     Nutrition Prescription:   Food and Nutrition Therapy:  TPN as noted above to continue  Coordination of Nutrition Care: Wound Care Team, Pharmacy    Monitoring:weight trend, labs, Intake.  Plan of Care:Continue    Follow Up: 5-7 Days    Mackey Birchwood RDN, LD  Pager [phone] Phone [phone]    07/29/2015 11:46 AM

## 2015-07-29 NOTE — Unmapped (Signed)
Pharmacy Nutrition Support Service    Linda Ponce is a 45 y.o. female on TPN.  Pharmacy was consulted for monitoring and adjustment.    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    Pt Weights:  39.8 kg (07/27/15)    42.8 kg (07/21/15)    Estimated Nutritional Needs: Per Dietary on 07/09/15   Based on Weight 45.6 kg   Amino Acids: 55-65 grams (1.2-1.4 grams/kg/day)   Fluid: 1550 mL/day   Total Daily Calories: 1500-1565 kcal (33-34 kcal/kg/day)    Recommended Macronutrients in TPN: Per Dietary on 07/29/15 (pt lost weight)   Total Volume: 1675 mL   Rate: 69.8 mL/h   Amino Acids: 80 g/day   Dextrose: 290 g/day   Lipids: 28 g/day    New TPN Formula (start 4/17 @ 1800)   Based on current weight of 39.8 kg   Amino Acids: 47.7 g/L (80 grams, 2 grams/kg/day, 320 kcal)   Dextrose: 175 g/L (293 grams, 997 kcal)   Lipids: 16.7 g/L (28 grams, 280 kcal)   Total Daily Calories: 1596 kcal (40 kcal/kg/day)   Volume: 1675 mL   Rate: 69.8 mL/h     Previous TPN Formula (last bag hung 4/16 @ 1800)   Based on current weight of 39.8 kg   Amino Acids: 53 g/L (88.8 grams, 2.2 grams/kg/day, 355 kcal)   Dextrose: 197 g/L (330 grams, 1121 kcal)   Lipids: 17 g/L (28.5 grams, 285 kcal)   Total Daily Calories: 1761 kcal (44 kcal/kg/day)   Volume: 1675 mL   Rate: 69.8 mL/h    TPN Medication Recent History (Show up to 1 orders; newest on the left.)     Start date and time   07/29/2015 1800      TPN ADULT ([facility]) [[id number]]    Order Status  Active       Macro Ingredients    amino acid 10%  47.7 g/L    dextrose 70%  175 g/L       QS Base    sterile water  313.5 mL       Lipids    fat emulsion 30 %  16.7 g/L       TPN Electrolytes entered as PER DAY amount    sodium chloride  80 mEq    magnesium sulfate  16 mEq    potassium chloride  20 mEq    potassium phosphate  15 mmol       TPN Additives entered as PER DAY amount    MVI adult no. 1 with vitamin K  10 mL    ascorbic acid (vitamin C)  200 mg    trace elements  (MULTITRACE-5)  1 mL       Calorie Contribution    Proteins  319.6 kcal    Dextrose  997.22 kcal    Lipids  279.6 kcal    Total  1,596.42 kcal       Electrolyte Ion Calculated Amount    Sodium  80 mEq    Potassium  42 mEq    Calcium  --    Magnesium  15.83 mEq    Aluminum  --    Phosphate  15 mmol    Chloride  100 mEq    Acetate  --       Other    Total Protein  79.9 g    Total Protein/kg  2.01 g/kg    Glucose Infusion Rate  5.12 mg/kg/min    Osmolarity  1,528.3    Volume  1,675 mL    Rate  69.8 mL/hr    Dosing Weight  39.8 kg    Infusion Site  [facility]        Diet Orders          TPN ADULT ([facility]) at 69.8 mL/hr starting at 04/17 1800    TPN ADULT ([facility]) at 69.8 mL/hr starting at 04/16 1800    Diet NPO effective now Except for: except ice chips, except sips of clears starting at 03/28 0959          TPN Associated Medications:    - GI Prophylaxis: None   - Antiemetic: ondansetron PRN   - Bowel Regimen: none   - Insulin Regimen: sliding scale regular insulin     Recent Labs:      Lab 07/29/15  0409 07/25/15  0626   SODIUM 142   142 139   POTASSIUM 4.1   4.1 4.4   CHLORIDE 107   107 109   CO2 26   26 26    BUN 30*   30* 35*   CREATININE 0.43*   0.43* 0.54*   GLUCOSE 75   75 70   CALCIUM 7.6*   7.6* 8.0*   PHOSPHORUS 3.5   3.5 3.8   MAGNESIUM 1.9 1.9   Corrected Calcium >= 9.6 (albumin < 1.5)      Lab 07/29/15  0409 07/26/15  0746 07/25/15  0626   ALK PHOS 181* 158*  --    ALT 22 21  --    AST 22 22  --    BILIRUBIN TOTAL 0.3 0.3  --    BILIRUBIN DIRECT 0.06 0.16  --    TOTAL PROTEIN 4.6* 4.6*  --    ALBUMIN <1.5*   <1.5* <1.5* 1.5*         Lab 07/29/15  0409   TRIGLYCERIDES 114         Assessment and Plan:    - Macronutrients: Discussed with Dietary. Macros were increased 4/12 due to pt weight loss but differed slightly from dietary's recommendations on 4/11. Modified macronutrients as per dietary's recommendations.  - Electrolytes: stable; no changes today.  - Multivitamin/Trace Elements: provided daily in TPN.    - Glucose: has been WNL, no sliding scale insulin doses required since 4/14.  - Other additives: ascorbic acid 200 mg added to each bag considering GI output and wound healing.  - Enteral Nutrition: NPO except for sips of clears.  - Check labs twice weekly: Monday/Thursday.    Thank you for the consult.    Kristie Cowman, PharmD  07/29/2015 10:15 AM

## 2015-07-29 NOTE — Unmapped (Signed)
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   Adequate intake via PO and TPN to support healing of areas on skin as diagnosis allows.   Outcome: Not Progressing  Weight decreased to 87 lb  Blood sugars in adequate range  Adequate hydration indicated, BUN elevated but improved  Kcal in TPN adjusted to increase calories.  MIld increase in AlkPhos, LFT o/w okay

## 2015-07-29 NOTE — Unmapped (Signed)
Problem: Potential for Compromised Skin Integrity  Goal: Skin integrity is maintained or improved  Assess and monitor skin integrity. Identify patients at risk for skin breakdown on admission and per policy. Collaborate with interdisciplinary team and initiate plans and interventions as needed.   Outcome: Progressing  Patients skin is kept clean and dry, turning and repositioning conducted every two hours.

## 2015-07-29 NOTE — Unmapped (Signed)
Assessment completed call light within reachcontinues atb

## 2015-07-30 LAB — POC GLU MONITORING DEVICE
POC Glucose Monitoring Device: 101 mg/dL (ref 70–100)
POC Glucose Monitoring Device: 100 mg/dL (ref 70–100)

## 2015-07-30 MED ORDER — famotidine (PF) (PEPCID) injection 20 mg
20 | Freq: Two times a day (BID) | INTRAVENOUS | Status: AC
Start: 2015-07-30 — End: 2015-09-13
  Administered 2015-07-31 – 2015-09-13 (×77): 20 mg via INTRAVENOUS

## 2015-07-30 MED ORDER — enoxaparin (LOVENOX) for prophylaxis syringe 40 mg/0.4 mL
40 | Freq: Every day | SUBCUTANEOUS | Status: AC
Start: 2015-07-30 — End: 2015-09-13
  Administered 2015-07-31 – 2015-09-05 (×4): 40 mg via SUBCUTANEOUS

## 2015-07-30 MED ORDER — TPN ADULT (WCH/Drake)
INTRAVENOUS | Status: AC
Start: 2015-07-30 — End: 2015-07-31
  Administered 2015-07-30: 21:00:00 via INTRAVENOUS

## 2015-07-30 MED FILL — HYDROMORPHONE 2 MG/ML INJECTION SYRINGE: 2 2 mg/mL | INTRAMUSCULAR | Qty: 2

## 2015-07-30 MED FILL — VANCOMYCIN 1,000 MG INTRAVENOUS INJECTION: 1000 1000 mg | INTRAVENOUS | Qty: 1000

## 2015-07-30 MED FILL — FLUCONAZOLE 200 MG/100 ML IN SOD. CHLORIDE (ISO) INTRAVENOUS PIGGYBACK: 200 200 mg/100 mL | INTRAVENOUS | Qty: 100

## 2015-07-30 MED FILL — FAMOTIDINE (PF) 20 MG/2 ML INTRAVENOUS SOLUTION: 20 20 mg/2 mL | INTRAVENOUS | Qty: 2

## 2015-07-30 MED FILL — FENTANYL 100 MCG/HR TRANSDERMAL PATCH: 100 100 mcg/hr | TRANSDERMAL | Qty: 1

## 2015-07-30 MED FILL — PIPERACILLIN-TAZOBACTAM 3.375 GRAM INTRAVENOUS SOLUTION: 3.375 3.375 gram | INTRAVENOUS | Qty: 1

## 2015-07-30 MED FILL — CLOPIDOGREL 75 MG TABLET: 75 75 mg | ORAL | Qty: 1

## 2015-07-30 MED FILL — TRAVASOL 10 % INTRAVENOUS SOLUTION: 10 10 % | INTRAVENOUS | Qty: 799

## 2015-07-30 NOTE — Unmapped (Signed)
RN Clinical Progress Note      Patient comes to [facility] for Post-Acute Care for the following reasons: jewish 07/05/15 dx: sepsis    Patient Active Problem List    Diagnosis Date Noted   ??? 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 Malnutrition 07/08/2015   ??? Fistula 07/08/2015   ??? Abdominal abscess 07/04/2015       Filed Vitals:    07/29/15 1239 07/29/15 2100 07/29/15 2249 07/30/15 0604   BP: 97/60  143/67 151/77   Pulse: 115  84 113   Temp: 98.5 ??F (36.9 ??C)  97.2 ??F (36.2 ??C) 97.4 ??F (36.3 ??C)   TempSrc: Oral  Oral Oral   Resp: 20  18 17    Height:       Weight:       SpO2: 100% 100% 100% 99%       Lines/Drains/Access - Yes   If yes, please list  R T PICC    Does the patient have any changes to their condition warranting intervention? No       Does the patient's have any transfers/appointments or testing scheduled -   No future appointments.    Patient/Family questions - No    RN Shift Note Additional:  Patient Pernie Crumrine is a 45 y.o. female that is alert and oriented x4. Patient is able to make needs known. Assessment completed and documented. Patient tolerated meds well. Pt c/o of pain treated with PRN Dilaudid, see MAR. Pt is afebrile. Patient respiratory status constant. Patient stable. Pt currently resting in bed. Call light within reach.     [name], RN  07/30/2015 6:20 PM

## 2015-07-30 NOTE — Unmapped (Signed)
Physical Therapy                                              Physical Therapy Treatment     Name: Linda Ponce  DOB: Sep 01, 1970  Attending Physician: Valda Lamb, MD  Admission Diagnosis: jewish 07/05/15 dx: sepsis  Date: 07/30/2015    Reviewed Pertinent hospital course: Yes  Hospital Course PT/OT: 4f with history of colorectal CA, initially admitted to [facility] post operatively for continued wound care. Developed bleeding, sent back to [facility] where she was foundto have multiple pelvic abscesses, enterocutaneous fistulas, was kept NPO, started on IV abx, TPN, and returns for continued care. PRECAUTIONS: FULL CODE, AAT, NO WEIGHT BEARING RESTRICTIONS, ISCHEMIC R TOES, MULTIPLE DRAINS FROM ABDOMEN AND BACK     Assessment     Assessment: Impaired Bed Mobility, Impaired Transfer Mobility, Impaired Ambulation, Impaired Balance, Deconditioning, Impaired Strength, Impaired ROM  Prognosis: Guarded (due to multiple medical issues )    Goals  Pt Will Go Supine To Sit: Minimal (to get in and out of bed within 4 weeks. )  Sit To Stand: Min A with LRAD to prepare for gait and transfers within 6 weeks.   Pt Will Transfer Bed/Chair: Minimal (with LRAD to increase independence in home within 6 weeks. )  Pt Will Ambulate: Minimal (25' with RW to access home within 6 weeks. )  Miscellaneous Goal #1: Pt will propel w/c 150' with B UEs with supervision to increase independence in home and community within 6 weeks.   Long Term Goal : Pt will be independent with HEP to improve B LE strength to facilitate safe mobility within 8 weeks.   Time frame for goals to be met in: 8 weeks, 09/14/15    Recommendation  Plan  Treatment/Interventions: LE strengthening/ROM, Endurance training, Patient/family training, Equipment eval/education, Museum/gallery curator, Therapeutic Exercise, Therapeutic Activity, Continued evaluation, Wheelchair Mobility, Compensatory technique education, Neuromuscular Reeducation, Gait training  PT Frequency:  (2-4x/wk  )    Recommendation  Recommendation: Short-term skilled PT  Equipment Recommended: Defer at this time    Problem List  Patient Active Problem List   Diagnosis   ??? Abdominal abscess   ??? Enterocutaneous fistula   ??? Malnutrition   ??? Fistula        Past Medical History  Past Medical History   Diagnosis Date   ??? Cancer    ??? Abscess of abdominal cavity    ??? Gangrene of foot    ??? AKI (acute kidney injury)    ??? Fistula    ??? Peritonitis and retroperitoneal infections    ??? Cachexia    ??? Hyperkalemia    ??? Dehydration    ??? Encephalopathies    ??? Ischemic foot         Past Surgical History  Past Surgical History   Procedure Laterality Date   ??? Colon surgery         Cognition:  Orientation Level: Oriented X4     Pain:  Pain Score: 10-Worst pain ever  Pain Location: Generalized  Pain Descriptors: Aching;Discomfort  Pain Intervention(s): Medication (See eMAR)         Mobility:   Pt declined OOB therapy.      Exercise:   TCNA stretching and LE exercise program completed and reviewed with pt and pt's sister.        Pt tearful upon  arrival and declining to get OOB this date. Pt's sister and sister's S.O. Present encouraging pt to get OOB and participate in therapy. 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 Plan to cont with POC with OOB to w/c and family education for PROM.     Patient Education  POC-plan for OOB tomorrow and next date with pt's sister present, importance of OOB and stretching BLEs     Time  Start Time: 1330  Stop Time: 1345  Time Calculation (min): 15 min    Charges       $Therapeutic Activity: 15 mins    Rulon Abide, [place], DPT [phone]

## 2015-07-30 NOTE — Unmapped (Signed)
Problem: Pain  Goal: STG - Pain is manageable through therapies  Outcome: Progressing  Patient c/o of pain. Treated with PRN Dilaudid, see MAR. Patient states some relief.

## 2015-07-30 NOTE — Unmapped (Signed)
Occupational Therapy  [facility]  Occupational Therapy      Name: Linda Ponce  DOB: 1970-11-16  Attending Physician: Valda Lamb, MD  Admission Diagnosis: jewish 07/05/15 dx: sepsis  Date: 07/30/2015    Reviewed Pertinent hospital course: Yes   Hospital Course PT/OT: 65f with history of colorectal CA, initially admitted to [facility] post operatively for continued wound care. Developed bleeding, sent back to [facility] where she was foundto have multiple pelvic abscesses, enterocutaneous fistulas, was kept NPO, started on IV abx, TPN, and returns for continued care. PRECAUTIONS: FULL CODE, AAT, NO WEIGHT BEARING RESTRICTIONS, ISCHEMIC R TOES, MULTIPLE DRAINS FROM ABDOMEN AND BACK       Brief Assessment  Assessment  Assessment: Decreased ADL status, Decreased self-care trans, Decreased Functional Mobility, Decreased activity tolerance  Prognosis: Guarded  Goal Formulation: Other (comment) (per therapist )    Goals  Pt Will demonstrate functional chair transfer:  (tolerate assessment; by July 17, 2015)  Pt Will participate in upper extremity HEP to prep for ADLs: strengthening / activity tolerance for functional use: Ongoing   Miscellaneous Goal #1: Patient will tolerate sitting EOB with maximal assist: By July 17, 2015  Long Term Goal : Patient to be seen 2-3 week trial period to determine if she can participate and benefit from active OT programming  Time frame for goals to be met in: 3 weeks     Recommendation  Plan  Treatment Interventions: Compensatory technique education, Therapeutic Activity, Patient/Family training, Excercise, ADL retraining, Functional transfer training, Activity Tolerance training, Energy Conservation  OT Frequency: 1-3x/wk    Cognition  Overall Cognitive Status: Within Functional Limits  Orientation Level: Oriented X4    Pain  Pain Score: 10-Worst pain ever  Pain Location: Generalized  Pain Descriptors: Aching;Discomfort  Pain Intervention(s): Medication (See eMAR)      Pt's sister and brother-in-law present this session. Pt again very anxious and at times angry; pt refusing any EOB and out of bed activity. Pt's focus this date was receiving LB ROM in prep for getting up out of bed. Pt education as well as family on benefits of getting up daily, taking initiation in her own care and positioning, of progressing self with therapy and skilled functional services vs. Dependent services. Pt verbalized understanding as well as family but continued to refuse out of bed. Wrote up [employer] program for pt to be stretched and complete T band exercises at least 3x's week; this made pt feel somewhat better. Per request left pt supine all needs met and in reach.  Cont per POC in colboration with OTR/L  [name], COTA/L       Patient Education:  Refer above     Time  Start Time: 1310  Stop Time: 1345  Time Calculation (min): 35 min    Charges       $Therapeutic Exercise: 8-22 mins  $Therapeutic Activity: 8-22 mins

## 2015-07-30 NOTE — Unmapped (Signed)
Hospitalist Progress Note    07/30/2015     4:03 PM    Linda Ponce   LOS: 22 days       HPI;    Patient is s/p OR on 05/31/2015 with [facility] for incision and drainage of complex abdominal wall abscess, rectal EUA, cystourethroscopy, bilateral retrograde pyelograms and removal old stent and placement of left 6x24 firm stent and right ureter embolization, right nephrostomy tube exchange, left nephrostomy tube placement and pelvic drain exchange on 06/06/2015 at a hospital in [place]. She was admitted to [facility] with severe sepsis secondary to peritonitis with stool coming from multiple abdominal wounds. She was seen by Critical Care, general surgery, and ID. General surgery recommended NPO and bowel rest and TPN has been started. She is receiving continued dressing changes and nephrostomy tube care. In addition, she was noted to have gangrene of the right foot and Dr. Donzetta Sprung performed angiogram, thrombectomy, and EIA stent on 06/27/15. Dr. Simeon Craft has evaluated and will follow as outpatient to consider transmetatarsal amputation at a later date. Patient is hemodynamically stable. She was  to [facility] and will need continued wound care, IV Vanc and Zosyn, and TPN.  She was sent back to [facility] on 07/05/2015  with abdominal pain and leukocytosis.CT scan showed multiple abdominal abscesses with large pelvic abscess . Her Hgb was 6.8 in ED and she was transfused 1 unit of blood.     Subjective & Interval History;    Pt examined at the bedside.  In a little better mood today.  Still pain is not under control.  Has lot of anxiety.  Denies any fever,chill, headache,N/V, CP ,SOB,Cough, Palpitation.  On TPN.  Mild tachycardia noted.    Review of Systems:   as above    PMH: Unchanged from H&P.   PSH: Unchanged from H&P.   Family medical history: Unchanged from H&P.   Social history: Unchanged from H&P      Scheduled Meds:  ??? clopidogrel  75 mg Per NG tube Daily 0900   ??? enoxaparin  40 mg  Subcutaneous Daily 0900   ??? famotidine (PF)  20 mg Intravenous BID   ??? fentaNYL  1 patch Transdermal Q72H   ??? fentaNYL  1 patch Transdermal Q72H   ??? fluconazole in NaCl (iso-osm)  200 mg Intravenous Q24H   ??? insulin regular  0-5 Units Subcutaneous Daily 0900   ??? piperacillin-tazobactam (ZOSYN) IV extended interval  3.375 g Intravenous Q8H   ??? vancomycin  1,000 mg Intravenous Q24H     Continuous Infusions:  ??? TPN ADULT ([facility]) 69.8 mL/hr at 07/29/15 1725   ??? TPN ADULT ([facility])       PRN Meds:ALPRAZolam, alteplase, dextrose, dextrose 50 % in water (D50W), heparin lock flush, HYDROmorphone, ipratropium-albuterol, LORazepam, ondansetron    Objective:    Vital signs in last 24 hours:  Temp:  [97.2 ??F (36.2 ??C)-97.4 ??F (36.3 ??C)] 97.4 ??F (36.3 ??C)  Heart Rate:  [84-113] 113  Resp:  [17-18] 17  BP: (143-151)/(67-77) 151/77 mmHg    Physical Examination:    GENERAL: The patient is awake,not in acute cardiorespiratory distress. Looking chronically sick.  HEENT:Atraumatic.Normocephalic. PERRLA, EOMI. No icterus, no pallor, no jugular venous distention..No ear or nose discharge.  NECK: Supple. Trachea is in midline. No Thyromegaly  CHEST: Symmetric. Clear to auscultation, bilateral equal air entry. No wheezing or rhonchi.  CARDIOVASCULAR: S1, S2, Regular pulse. No murmur or gallops. No JVD.  ABDOMEN: Whole anterior abdomen  is covered with pouch. Brownish fistula output noted.Bilateral nephrostomy tubes.  EXTREMITIES: There is + BLE edema . Very tender to touch both legs.  Right toes are gangrenous. +dorsalis pedis pulses.  Skin;No bruise,No rash.No petechiae.  CNS; CN II-XII grossly intact. No focal neurological deficit.       Intake/Output last 3 shifts:  I/O last 3 completed shifts:  In: 2380 [P.O.:480; I.V.:1900]  Out: 3500 [Urine:1900; Drains:600; Stool:1000]    Recent Labs;                           Lab name 07/25/15  0626 07/26/15  0746 07/29/15  0409   WBC 11.4* 13.3* 9.1   HEMOGLOBIN 9.7* 9.4* 9.0*   HEMATOCRIT  30.3* 29.5* 27.6*   MEAN CORPUSCULAR VOLUME 89.8 90.9 92.8   PLATELETS 677* 667* 602*                                                            Lab name 07/22/15  0604 07/25/15  0626 07/29/15  0409   SODIUM 139 139 142   142   POTASSIUM 4.7 4.4 4.1   4.1   CREATININE 0.79 0.54* 0.43*   0.43*   BUN 44* 35* 30*   30*   CHLORIDE 105 109 107   107   CO2 27 26 26   26    PHOSPHORUS 4.7 3.8 3.5   3.5   MAGNESIUM 2.3 1.9 1.9                              Lab name 07/15/15  0840 07/22/15  0604 07/26/15  0746 07/29/15  0409   AST 93* 22 22 22    ALT 38 29 21 22    BILIRUBIN TOTAL 0.8 0.3 0.3 0.3                          Lab name 07/05/15  0702  07/09/15  0544  07/25/15  0626 07/26/15  0746 07/29/15  0409   PREALBUMIN 14.0*  --  16.0*  --   --   --   --    ALBUMIN  --   < > 1.5*  < > 1.5* <1.5* <1.5*   <1.5*   < > = values in this interval not displayed.                                                                                                   Lab name 07/25/15  0626 07/29/15  0409   CALCIUM 8.0* 7.6*   7.6*   PHOSPHORUS 3.8 3.5   3.5                              Lab name  07/22/15  0604 07/29/15  0409   TRIGLYCERIDES 125 114                            Lab name 07/22/15  0604 07/27/15  0900 07/29/15  0830   VANCOMYCIN TROUGH 11.4 14.9 10.8       Diet;    Diet Orders          TPN ADULT ([facility]) at 69.8 mL/hr starting at 04/18 1800    TPN ADULT ([facility]) at 69.8 mL/hr starting at 04/17 1800    Diet NPO effective now Except for: except ice chips, except sips of clears starting at 03/28 1610          Future Appointments:  No future appointments.    Assessment/Plan:        1. Peritoneal abscess- Mixed flora, pigtail drain 06/26/15 Refused followup CT, still requires one. ID consult-  Vanco/Zosyn/Diflucan. Trending down now.   2. Enterocutaneous Fistula- wound care, bowel rest. Still has significant output.  3. Severe intractable acute on chronic pain syndrome- receiving Dilaudid IV BID prn, fentanyl patch dose  increased  4. Thrombocytosis. uncertain etiology- follow closely. Likely reactive.  5. Cachexia-   continue TPN, recheck PAB. Albumin is trending down.    6. Ischemic feet/arterial thromboemboli- The patient had a clot in her femoral artery right leg that was cleared by vascular surgery.  However the patient ended up with necrosis at her distal digits which may be a combination of lack of blood flow and the use of pressors. Refusing heparin, aspirin and Plavix. Refused all heparin injections previously as well. Canceled aspirin but continue to encourage Plavix.. Likely will need amputation. F/u with Podiatry at [facility].    7. History of colon cancer s/p exenteration with bilateral nephrostomy tubes  8. Recurrent presacral abscesses  9. Debility- PT/OT  10. Guarded prognosis.     Prophylaxis;  GI Prophylaxis: Pepcid  DVT Prophylaxis: Lovenox     Code Status: Full Code      Baird Polinski MD.       07/30/2015

## 2015-07-30 NOTE — Unmapped (Signed)
Nursing Clinical Progress Note      Patient comes to [facility] for Post-Acute Care for the following reasons: jewish 07/05/15 dx: sepsis    Patient Active Problem List    Diagnosis Date Noted   ??? 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 Malnutrition 07/08/2015   ??? Fistula 07/08/2015   ??? Abdominal abscess 07/04/2015       Filed Vitals:    07/28/15 0454 07/29/15 1239 07/29/15 2100 07/29/15 2249   BP: 106/65 97/60  143/67   Pulse: 122 115  84   Temp: 98 ??F (36.7 ??C) 98.5 ??F (36.9 ??C)  97.2 ??F (36.2 ??C)   TempSrc: Oral Oral  Oral   Resp: 19 20  18    Height:       Weight:       SpO2: 100% 100% 100% 100%       Lines/Drains/Access - Yes   If yes, please list  right tripple lumen CVC, nephrostomy, fistula    Does the patient have any changes to their condition warranting intervention? No       RN Shift Note Additional: na    Does the patient's have any transfers/appointments or testing scheduled -   No future appointments.    Patient/Family questions - No    Phylicia Reginal Lutes, RN

## 2015-07-30 NOTE — Unmapped (Signed)
Pharmacy Nutrition Support Service    Linda Ponce is a 45 y.o. female on TPN.  Pharmacy was consulted for monitoring and adjustment.    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    Pt Weights:  39.8 kg (07/27/15)    42.8 kg (07/21/15)    Estimated Nutritional Needs: Per Dietary on 07/09/15   Based on Weight 45.6 kg   Amino Acids: 55-65 grams (1.2-1.4 grams/kg/day)   Fluid: 1550 mL/day   Total Daily Calories: 1500-1565 kcal (33-34 kcal/kg/day)    Recommended Macronutrients in TPN: Per Dietary on 4/17 (pt lost weight)   Total Volume: 1675 mL   Rate: 69.8 mL/h   Amino Acids: 80 g/day   Dextrose: 290 g/day   Lipids: 28 g/day   Calories: 1552 (39 kcal/kg)    Current TPN Formula (started 4/17)   Based on current weight of 39.8 kg   Amino Acids: 47.7 g/L (80 grams, 2 grams/kg/day, 320 kcal)   Dextrose: 175 g/L (293 grams, 997 kcal)   Lipids: 16.7 g/L (28 grams, 280 kcal)   Total Daily Calories: 1596 kcal (40 kcal/kg/day)   Volume: 1675 mL   Rate: 69.8 mL/h    TPN Medication Recent History (Show up to 1 orders; newest on the left.)     Start date and time   07/30/2015 1800      TPN ADULT ([facility]) [[id number]]    Order Status  Active       Macro Ingredients    amino acid 10%  47.7 g/L    dextrose 70%  175 g/L       QS Base    sterile water  313.5 mL       Lipids    fat emulsion 30 %  16.7 g/L       TPN Electrolytes entered as PER DAY amount    sodium chloride  80 mEq    magnesium sulfate  16 mEq    potassium chloride  20 mEq    potassium phosphate  15 mmol       TPN Additives entered as PER DAY amount    MVI adult no. 1 with vitamin K  10 mL    ascorbic acid (vitamin C)  200 mg    trace elements (MULTITRACE-5)  1 mL       Calorie Contribution    Proteins  319.6 kcal    Dextrose  997.22 kcal    Lipids  279.6 kcal    Total  1,596.42 kcal       Electrolyte Ion Calculated Amount    Sodium  80 mEq    Potassium  42 mEq    Calcium  --    Magnesium  15.83 mEq    Aluminum  --    Phosphate  15 mmol     Chloride  100 mEq    Acetate  --       Other    Total Protein  79.9 g    Total Protein/kg  2.01 g/kg    Glucose Infusion Rate  5.12 mg/kg/min    Osmolarity  1,528.3    Volume  1,675 mL    Rate  69.8 mL/hr    Dosing Weight  39.8 kg    Infusion Site  Central        Diet Orders          TPN ADULT ([facility]) at 69.8 mL/hr starting at 04/18 1800    TPN ADULT ([facility]) at 69.8 mL/hr  starting at 04/17 1800    Diet NPO effective now Except for: except ice chips, except sips of clears starting at 03/28 0959          TPN Associated Medications:    - GI Prophylaxis: None   - Antiemetic: ondansetron PRN   - Bowel Regimen: none   - Insulin Regimen: sliding scale regular insulin     Recent Labs:      Lab 07/29/15  0409 07/25/15  0626   SODIUM 142   142 139   POTASSIUM 4.1   4.1 4.4   CHLORIDE 107   107 109   CO2 26   26 26    BUN 30*   30* 35*   CREATININE 0.43*   0.43* 0.54*   GLUCOSE 75   75 70   CALCIUM 7.6*   7.6* 8.0*   PHOSPHORUS 3.5   3.5 3.8   MAGNESIUM 1.9 1.9   Corrected Calcium >= 9.6 (albumin < 1.5)      Lab 07/29/15  0409 07/26/15  0746 07/25/15  0626   ALK PHOS 181* 158*  --    ALT 22 21  --    AST 22 22  --    BILIRUBIN TOTAL 0.3 0.3  --    BILIRUBIN DIRECT 0.06 0.16  --    TOTAL PROTEIN 4.6* 4.6*  --    ALBUMIN <1.5*   <1.5* <1.5* 1.5*         Lab 07/29/15  0409   TRIGLYCERIDES 114         Assessment and Plan:    - Macronutrients: discussed with dietary on 4/17 and modified formulation as per dietary's recommendations.  - Electrolytes: no updates today. Next labs scheduled for Thurs 4/20.  - Multivitamin/Trace Elements: provided daily in TPN.   - Glucose: has been WNL, no sliding scale insulin doses required since 4/14.  - Other additives: ascorbic acid 200 mg added to each bag considering GI output and wound healing.  - Check labs twice weekly: Monday/Thursday.    Thank you for the consult.    Kristie Cowman, PharmD  07/30/2015 8:28 AM

## 2015-07-31 LAB — CBC
Hematocrit: 27.6 % — ABNORMAL LOW (ref 35.0–45.0)
Hemoglobin: 9 g/dL — ABNORMAL LOW (ref 11.7–15.5)
MCH: 29.9 pg (ref 27.0–33.0)
MCHC: 32.4 g/dL (ref 32.0–36.0)
MCV: 92.3 fL (ref 80.0–100.0)
MPV: 7 fL — ABNORMAL LOW (ref 7.5–11.5)
Platelets: 562 10*3/uL — ABNORMAL HIGH (ref 140–400)
RBC: 2.99 10*6/uL — ABNORMAL LOW (ref 3.80–5.10)
RDW: 20.8 % — ABNORMAL HIGH (ref 11.0–15.0)
WBC: 7 10*3/uL (ref 3.8–10.8)

## 2015-07-31 LAB — POC GLU MONITORING DEVICE
POC Glucose Monitoring Device: 105 mg/dL — ABNORMAL HIGH (ref 70–100)
POC Glucose Monitoring Device: 112 mg/dL (ref 70–100)

## 2015-07-31 MED ORDER — TPN ADULT (WCH/Drake)
2 | INTRAVENOUS | Status: AC
Start: 2015-07-31 — End: 2015-08-01
  Administered 2015-07-31: 21:00:00 via INTRAVENOUS

## 2015-07-31 MED FILL — HYDROMORPHONE 2 MG/ML INJECTION SYRINGE: 2 2 mg/mL | INTRAMUSCULAR | Qty: 2

## 2015-07-31 MED FILL — PIPERACILLIN-TAZOBACTAM 3.375 GRAM INTRAVENOUS SOLUTION: 3.375 3.375 gram | INTRAVENOUS | Qty: 1

## 2015-07-31 MED FILL — TRAVASOL 10 % INTRAVENOUS SOLUTION: 10 10 % | INTRAVENOUS | Qty: 799

## 2015-07-31 MED FILL — FAMOTIDINE (PF) 20 MG/2 ML INTRAVENOUS SOLUTION: 20 20 mg/2 mL | INTRAVENOUS | Qty: 2

## 2015-07-31 MED FILL — FLUCONAZOLE 200 MG/100 ML IN SOD. CHLORIDE (ISO) INTRAVENOUS PIGGYBACK: 200 200 mg/100 mL | INTRAVENOUS | Qty: 100

## 2015-07-31 MED FILL — VANCOMYCIN 1,000 MG INTRAVENOUS INJECTION: 1000 1000 mg | INTRAVENOUS | Qty: 1000

## 2015-07-31 MED FILL — ENOXAPARIN 40 MG/0.4 ML SUBCUTANEOUS SYRINGE: 40 40 mg/0.4 mL | SUBCUTANEOUS | Qty: 0.4

## 2015-07-31 MED FILL — CLOPIDOGREL 75 MG TABLET: 75 75 mg | ORAL | Qty: 1

## 2015-07-31 MED FILL — FENTANYL 25 MCG/HR TRANSDERMAL PATCH: 25 25 mcg/hr | TRANSDERMAL | Qty: 1

## 2015-07-31 NOTE — Unmapped (Signed)
Problem: Potential for Compromised Skin Integrity  Goal: Skin integrity is maintained or improved  Assess and monitor skin integrity. Identify patients at risk for skin breakdown on admission and per policy. Collaborate with interdisciplinary team and initiate plans and interventions as needed.   Outcome: Progressing  Keep skin clean and dry, turn patient, avoid shearing,relieve pressure to bony prominences, encourage use of moisturizer on skin

## 2015-07-31 NOTE — Unmapped (Signed)
Problem: Pain  Goal: STG - Pain is manageable through therapies  Outcome: Progressing  Patient c/o of pain. Treated with PRN Dilaudid, see MAR. Patient states some relief.

## 2015-07-31 NOTE — Unmapped (Signed)
Patient alert and oriented x4 upon assessment this evening.  Speech is clear, and patient appears calm.  No c/o cough or sore throat, and lung sounds are clear. Vitals are stable.  Patient has c/o pain and discomfort with prn pain medication which was effective, and no s/s of distress are evident at this time.  Bed mobility and ADL care are performed with complete staff assistance and without complication. Patient takes medications well via IV.  Over bed table and call light is within reach.  Side rails are up x2.  Patient with non skid footwear on. TPN infusing without complications.

## 2015-07-31 NOTE — Unmapped (Signed)
Occupational Therapy  [facility] for   Post Acute Care  Occupational Therapy   Name: Linda Ponce  DOB: 1970-05-09  Attending Physician: Valda Lamb, MD  Admission Diagnosis: jewish 07/05/15 dx: sepsis  Date: 07/31/2015    Reviewed Pertinent hospital course: Yes   Hospital Course PT/OT: 68f with history of colorectal CA, initially admitted to [facility] operatively for continued wound care. Developed bleeding, sent back to [facility] where she was foundto have multiple pelvic abscesses, enterocutaneous fistulas, was kept NPO, started on IV abx, TPN, and returns for continued care. PRECAUTIONS: FULL CODE, AAT, NO WEIGHT BEARING RESTRICTIONS, ISCHEMIC R TOES, MULTIPLE DRAINS FROM ABDOMEN AND BACK       Brief Assessment  Assessment  Assessment: Decreased ADL status, Decreased self-care trans, Decreased Functional Mobility, Decreased activity tolerance  Prognosis: Guarded  Goal Formulation: Other (comment) (per therapist )    Goals  Pt Will demonstrate functional chair transfer:  (tolerate assessment; by July 17, 2015)  Pt Will participate in upper extremity HEP to prep for ADLs: strengthening / activity tolerance for functional use: Ongoing   Miscellaneous Goal #1: Patient will tolerate sitting EOB with maximal assist: By July 17, 2015  Long Term Goal : Patient to be seen 2-3 week trial period to determine if she can participate and benefit from active OT programming  Time frame for goals to be met in: 3 weeks     Recommendation  Plan  Treatment Interventions: Compensatory technique education, Therapeutic Activity, Patient/Family training, Excercise, ADL retraining, Functional transfer training, Activity Tolerance training, Energy Conservation  OT Frequency: 1-3x/wk    Cognition  Overall Cognitive Status: Within Functional Limits  Orientation Level: Oriented X4    Pain  Pain Score: 10-Worst pain ever  Pain Location: Generalized  Pain Descriptors: Aching;Discomfort  Pain Intervention(s): Medication (See eMAR)      Mobility  Bed Mobility  Supine to Sit: Max assist to left  Functional Transfers  Bed to Chair: Max assist to left;Stand Pivot      Exercises       ADL  Where Assessed: Chair  Grooming Assistance: Stand by  Grooming Deficit: Setup  UE Dressing Assistance: Minimal  UE Dressing Deficit: Setup;Fasteners;Increased time to complete;Pull around back  LE Dressing Assistance: Total  LE Dressing Deficit: Don/doff R sock;Don/doff L sock    Pt at beginning of treatment refused getting out of bed again due to anxiety and pain. Reminded pt of plan set with sister last treatment session and reminders of pt's long term goals. Pt eventually agreed to out of bed. Once on EOB pt had increased anxiety but with breathing and calming pt able to return to normal. Max A transfer to recliner as pt was crying out and somewhat resisting movement due to fear. Pt completed grooming dressing once in recliner. Pt left up in tv room with roommate; nurse aide aware.  Cont per POC in colboration with OTR/L  [name], COTA/L    Patient Education:  Importance of getting up daily    Time  Start Time: 1305  Stop Time: 1355  Time Calculation (min): 50 min    Charges       $Therapeutic Activity: 38-52 mins

## 2015-07-31 NOTE — Unmapped (Signed)
RN Clinical Progress Note      Patient comes to [facility] for Post-Acute Care for the following reasons: jewish 07/05/15 dx: sepsis    Patient Active Problem List    Diagnosis Date Noted   ??? 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 Malnutrition 07/08/2015   ??? Fistula 07/08/2015   ??? Abdominal abscess 07/04/2015       Filed Vitals:    07/29/15 2249 07/30/15 0604 07/30/15 2355 07/31/15 0600   BP: 143/67 151/77 107/66 119/79   Pulse: 84 113 107 122   Temp:  97.4 ??F (36.3 ??C) 98.9 ??F (37.2 ??C) 98.9 ??F (37.2 ??C)   TempSrc: Oral Oral Oral Oral   Resp: 18 17 24 24    Height:       Weight:       SpO2: 100% 99% 100% 100%       Lines/Drains/Access - Yes   If yes, please list  R T IJ    Does the patient have any changes to their condition warranting intervention? No       Does the patient's have any transfers/appointments or testing scheduled -   No future appointments.    Patient/Family questions - No    RN Shift Note Additional:  Patient Linda Ponce is a 45 y.o. female that is alert and oriented x4. Patient is able to make needs known. Assessment completed and documented. Patient tolerated meds well. Wound care completed, tolerated well. Pt c/o of pain treated with PRN Dilaudid, see MAR. Pt is afebrile. Patient respiratory status constant. Patient pain level denies pain at this time. Patient stable. Pt currently resting in bed. Call light within reach.     [name], RN  07/31/2015 7:12 PM

## 2015-07-31 NOTE — Unmapped (Signed)
Clinical Pharmacy Service - TPN Progress Note    Linda Ponce is a 45 y.o. female followed by the pharmacy department for monitoring and adjustment of TPN    Assessment/Plan:   1) Continue continuous TPN with electrolytes, macronutrients and additives from yesterday  2) Routine labs ordered Monday and Thursday  3)Pharmacy will continue to monitor electrolytes and make necessary adjustments.    Thank you for the consult,  Tillie Fantasia, [facility]   07/31/2015 8:18 AM

## 2015-07-31 NOTE — Unmapped (Signed)
LM for sister, April Moon, asked for a call back to touch base on clinical situation.  No medical records regarding cancer treatment.

## 2015-07-31 NOTE — Unmapped (Signed)
Hospitalist Progress Note    07/31/2015     9:05 AM    Linda Ponce   LOS: 23 days       HPI;    Patient is s/p OR on 05/31/2015 with [facility] for incision and drainage of complex abdominal wall abscess, rectal EUA, cystourethroscopy, bilateral retrograde pyelograms and removal old stent and placement of left 6x24 firm stent and right ureter embolization, right nephrostomy tube exchange, left nephrostomy tube placement and pelvic drain exchange on 06/06/2015 at a hospital in [place]. She was admitted to [facility] with severe sepsis secondary to peritonitis with stool coming from multiple abdominal wounds. She was seen by Critical Care, general surgery, and ID. General surgery recommended NPO and bowel rest and TPN has been started. She is receiving continued dressing changes and nephrostomy tube care. In addition, she was noted to have gangrene of the right foot and Dr. Donzetta Sprung performed angiogram, thrombectomy, and EIA stent on 06/27/15. Dr. Simeon Craft has evaluated and will follow as outpatient to consider transmetatarsal amputation at a later date. Patient is hemodynamically stable. She was  to [facility] and will need continued wound care, IV Vanc and Zosyn, and TPN.  She was sent back to [facility] on 07/05/2015  with abdominal pain and leukocytosis.CT scan showed multiple abdominal abscesses with large pelvic abscess . Her Hgb was 6.8 in ED and she was transfused 1 unit of blood.     Subjective & Interval History;    Tearful. Looking chronically sick.  States that pain is not under good control but she will try to manage it with current regimen.  No fever or chills.  Right toes are painful.  Mild tachycardia noted. Not looking in acute cardiorespiratory distress.      Review of Systems:   as above    PMH: Unchanged from H&P.   PSH: Unchanged from H&P.   Family medical history: Unchanged from H&P.   Social history: Unchanged from H&P      Scheduled Meds:  ??? clopidogrel  75 mg Per NG tube  Daily 0900   ??? enoxaparin  40 mg Subcutaneous Daily 0900   ??? famotidine (PF)  20 mg Intravenous BID   ??? fentaNYL  1 patch Transdermal Q72H   ??? fentaNYL  1 patch Transdermal Q72H   ??? fluconazole in NaCl (iso-osm)  200 mg Intravenous Q24H   ??? insulin regular  0-5 Units Subcutaneous Daily 0900   ??? piperacillin-tazobactam (ZOSYN) IV extended interval  3.375 g Intravenous Q8H   ??? vancomycin  1,000 mg Intravenous Q24H     Continuous Infusions:  ??? TPN ADULT ([facility]) 69.8 mL/hr at 07/30/15 1704   ??? TPN ADULT ([facility])       PRN Meds:ALPRAZolam, alteplase, dextrose, dextrose 50 % in water (D50W), heparin lock flush, HYDROmorphone, ipratropium-albuterol, LORazepam, ondansetron    Objective:    Vital signs in last 24 hours:  Temp:  [98.9 ??F (37.2 ??C)] 98.9 ??F (37.2 ??C)  Heart Rate:  [107-122] 122  Resp:  [24] 24  BP: (107-119)/(66-79) 119/79 mmHg    Physical Examination:    GENERAL: The patient is awake,not in acute cardiorespiratory distress. Looking chronically sick.  HEENT:Atraumatic.Normocephalic. PERRLA, EOMI. No icterus, no pallor, no jugular venous distention..No ear or nose discharge.  NECK: Supple. Trachea is in midline. No Thyromegaly  CHEST: Symmetric. Clear to auscultation, bilateral equal air entry. No wheezing or rhonchi.  CARDIOVASCULAR: S1, S2, Regular pulse. No murmur or gallops. No JVD.  ABDOMEN:  Whole anterior abdomen is covered with pouch. Brownish fistula output noted.Bilateral nephrostomy tubes.  EXTREMITIES: There is + BLE edema . Very tender to touch both legs.  Right toes are gangrenous.   Skin;No bruise,No rash.No petechiae.  CNS; CN II-XII grossly intact. No focal neurological deficit.       Intake/Output last 3 shifts:  I/O last 3 completed shifts:  In: 3363 [P.O.:1080; IV Piggyback:100]  Out: 3650 [Urine:2050; Drains:600; Stool:1000]    Recent Labs;                           Lab name 07/26/15  0746 07/29/15  0409 07/31/15  0519   WBC 13.3* 9.1 7.0   HEMOGLOBIN 9.4* 9.0* 9.0*   HEMATOCRIT 29.5*  27.6* 27.6*   MEAN CORPUSCULAR VOLUME 90.9 92.8 92.3   PLATELETS 667* 602* 562*                                                            Lab name 07/22/15  0604 07/25/15  0626 07/29/15  0409   SODIUM 139 139 142   142   POTASSIUM 4.7 4.4 4.1   4.1   CREATININE 0.79 0.54* 0.43*   0.43*   BUN 44* 35* 30*   30*   CHLORIDE 105 109 107   107   CO2 27 26 26   26    PHOSPHORUS 4.7 3.8 3.5   3.5   MAGNESIUM 2.3 1.9 1.9                              Lab name 07/15/15  0840 07/22/15  0604 07/26/15  0746 07/29/15  0409   AST 93* 22 22 22    ALT 38 29 21 22    BILIRUBIN TOTAL 0.8 0.3 0.3 0.3                          Lab name 07/05/15  0702  07/09/15  0544  07/25/15  0626 07/26/15  0746 07/29/15  0409   PREALBUMIN 14.0*  --  16.0*  --   --   --   --    ALBUMIN  --   < > 1.5*  < > 1.5* <1.5* <1.5*   <1.5*   < > = values in this interval not displayed.                                                                                                   Lab name 07/25/15  0626 07/29/15  0409   CALCIUM 8.0* 7.6*   7.6*   PHOSPHORUS 3.8 3.5   3.5  Lab name 07/22/15  0604 07/29/15  0409   TRIGLYCERIDES 125 114                            Lab name 07/22/15  0604 07/27/15  0900 07/29/15  0830   VANCOMYCIN TROUGH 11.4 14.9 10.8       Diet;    Diet Orders          TPN ADULT ([facility]) at 69.8 mL/hr starting at 04/19 1800    TPN ADULT ([facility]) at 69.8 mL/hr starting at 04/18 1800    Diet NPO effective now Except for: except ice chips, except sips of clears starting at 03/28 0981          Future Appointments:  No future appointments.    Assessment/Plan:        1. Peritoneal abscess- Mixed flora, pigtail drain 06/26/15 Refused followup CT, still requires one. ID consult-  Vanco/Zosyn/Diflucan. Trending down now.   2. Enterocutaneous Fistula- wound care, bowel rest. Still has significant output.  3. Severe intractable acute on chronic pain syndrome- receiving Dilaudid IV BID prn, fentanyl patch dose  increased  4. Thrombocytosis. uncertain etiology- follow closely. Likely reactive.  5. Cachexia-   continue TPN, recheck PAB. Albumin is trending down.    6. Ischemic feet/arterial thromboemboli- The patient had a clot in her femoral artery right leg that was cleared by vascular surgery.  However the patient ended up with necrosis at her distal digits which may be a combination of lack of blood flow and the use of pressors. Refusing heparin, aspirin and Plavix. Refused all heparin injections previously as well. Canceled aspirin but continue to encourage Plavix.. F/u with Podiatry at [facility].    7. History of colon cancer s/p exenteration with bilateral nephrostomy tubes  8. Recurrent presacral abscesses  9. Debility- PT/OT  10. Guarded prognosis.     Prophylaxis;  GI Prophylaxis: Pepcid  DVT Prophylaxis: Lovenox     Code Status: Full Code      Briah Nary MD.       07/31/2015

## 2015-07-31 NOTE — Unmapped (Signed)
Spoke to Bowman at Dr. Janifer Adie office [phone]) regarding pt's right foot. Upon assessment, dorsal foot still with stable eschar but plantar foot opened up, appears as superficial tissue loss, small serosanguinous drainage. Toniann Fail discussed with Dr. Simeon Craft, there is concern that this could be wet gangrene vs. being related to friction, he recommended mepitel dressing followed by dry cover dressing, closest dressing stocked is Adaptic touch, he also indicated to avoid adding any extra moisture to wound bed.

## 2015-07-31 NOTE — Unmapped (Signed)
[place]    [name]   Wound Care Assessment  07/31/2015       Linda Ponce is an 45 y.o. female.  DOB: Feb 03, 1971    Pressure ulcers on admission:  One STAGE: III on coccyx              One UNSTAGEABLE on right lateral hip    Diagnosis: jewish 07/05/15 dx: sepsis    Allergies   Allergen Reactions   ??? Compazine [Prochlorperazine Edisylate]    ??? Latex, Natural Rubber        History:    Past Medical History   Diagnosis Date   ??? Cancer    ??? Abscess of abdominal cavity    ??? Gangrene of foot    ??? AKI (acute kidney injury)    ??? Fistula    ??? Peritonitis and retroperitoneal infections    ??? Cachexia    ??? Hyperkalemia    ??? Dehydration    ??? Encephalopathies    ??? Ischemic foot      Past Surgical History   Procedure Laterality Date   ??? Colon surgery       Social History   Substance Use Topics   ??? Smoking status: Never Smoker    ??? Smokeless tobacco: None   ??? Alcohol Use: None     Present admission history of illness/MD note.  62f with history of colorectal CA, initially admitted to [facility] operatively for continued wound care. Developed bleeding, sent back to [facility] where she was foundto have multiple pelvic abscesses, enterocutaneous fistulas, was kept NPO, started on IV abx, TPN, and returns for continued care.    Skin/Wound Care Assessment.      Braden: 14     Pressure wounds:    Coccyx STAGE:   III    Measures:  L 0.5 cm X W 0.5 cm X D  0.01 cm.   Wounds progress:  Improving  Wound Assessment:  Decreasing in size, open area of moist pink tissue, now very superficial tissue loss, no drainage, no odor noted. Edges flat, attached. Periwound skin intact.   Treatment/Dressing: Clean with normal saline, pat dry. Apply fibracol to wound bed and cover with Mepilex border. Change every other day and prn dressing failure.      _____________________________________________________________________  Surgical wounds:????????????????????????????????????????????????????????????    Mid Abdominal Wound with fistula???? Full Thickness. Measurements: 9 cm L x 4.3 cm W  x 1 cm D, has tunnel at 3 oclock that appears to tunnel over to wound/fistula on left abdomen   Wounds progress:????improving  Wound Assessment:??Wound width and depth are decreasing. Wound bed with moist bright pink tissue, irregular attached edges. Large amount liquid gold effluent draining into foley bag. Periwound skin slightly denuded in crevices but otherwise intact. Pt reports 3-4 days wear time out of pouch. Decreased pouch size d/t wound size decreasing.  Treatment/Dressing: Mid abdominal and left abdominal fistula/wounds being managed with Eakins pouches attached to gravity drainage bags. Clean wounds and surrounding skin with soap and water, pat dry. Attach Eakins pouch using Eakins rings to help with adherence. Use pouch [phone] for mid abdomen and for left abdomen. Change pouches weekly and prn for leakages.      ____________________________________________________________________  Surgical wounds:??????  ??????  Left Lateral Abdomen Fistula???? Full Thickness. Measurements from 4/3: L 5.5?? cm X W 1.5 cm X?? D 1.5 cm, has tunnel at 9 oclock that appears to tunnel over to larger wound/fistula of mid abdomen  ??  Wounds  progress:??UTA  Wound Assessment:??Pouch clean and dry, mostly intact but starting to peel up along medial edge, reinforced this area. Unable to clearly visualize wound bed. Moderate amount liquid gold effluent draining into foley bag. Visible periwound skin intact. Pt reports she is getting 3-4 days wear time from pouch.  Treatment/Dressing: Mid abdominal and left abdominal fistula/wounds being managed with Eakins pouches attached to gravity drainage bags. Clean wounds and surrounding skin with soap and water, pat dry. Attach Eakins pouch using Eakins rings to help with adherence. Use pouch [phone] for mid abdomen and for left abdomen. Change pouches weekly and prn for leakages.      _____________________________________________________________________  Surgical wounds:????  ????????  Drain of left groin ??  ??  Wounds progress:????No change??  Wound Assessment: Drain intact, sutured in place. No drainage in pouch. Peri-wound clean/dry/intact  Treatment/Dressing: Drain to left groin is open, apply small Hollister pouch [id number] to this, change pouch weekly and prn dressing failure.    *Changed pouch.    _____________________________________________________________________  Surgical wounds:??????  ??????  Bilateral nephrostomy drains  Wounds progress:??UTA  Wound Assessment: Able to visualize left dressing which is clean/dry/intact, pt would not allow WCT to visualize right dressing. Bilateral tubes to closed system drainage bag, gold liquid in drainage bags. Pt declined dressing change to either site.  Treatment/Dressing: Bilateral nephrostomy tubes both to drainage pouches. Dressing changes every 3 days and prn for dressing failure. Clean sites with CHG, allow to dry. Apply AMD slit drain gauze to site, cover with Tegaderm. Position tubing so that patient is not lying on tubing.Drain to left groin is currently capped, apply small Hollister pouch [phone] to this, change  pouch weekly and prn dressing failure.      ____________________________________________________________________  Vascular Wounds on Right Foot????????    Wounds progress:????declining  Wound Assessment:?? Dry black eschar to dorsal portion of 1st through 5th toes and plantar portion of 2nd though 5th toes, great toenail has fallen off. Newly opened areas to plantar distal third of foot and plantar great toe, has superficial thickness tissue loss, edges loosely attached, small serosanguinous drainage, extremely painful per pt. Eschar has not extended beyond marked line.   Treatment/Dressing: Called Dr. Janifer Adie (podiatry) office, spoke to his nurse Toniann Fail, who was able to communicated with him. There is concern of this being wet gangrene vs. being friction related, they suggest using mepitel to the open areas (will use adaptic touch as this is our most equivalent product)  and cover with a dry dressing, daily, stressed importance of not adding moisture to wound bed. Will continue betadine daily to stable eschar areas.     *Pt has follow-up with Dr. Luan Pulling next Tuesday, Toniann Fail indicates he will looks at pt's foot as well as all fistulas and drains.    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 Keep covered with small ostomy pouch Hollister # [id number]. Change weekly and prn dressing failure.     *Changed pouch.     ______________________________________________________________    Treatment time: 2 nurses approximately 1 hour ????????????????????????    Preventative measures.    Off loading and pressure relief as measures are using an accumax mattress, Turning Wedge, pillows, and waffle boots to aid in off load of  pressure points.  Other measures include lotion's and cream's to Hydrate and moisturize dry skin.  Moisture barrier cream [employer] clear is provided to protect groin and peri anal area from incontinence. Currently has foley catheter with statlock in place.         Labs:   Lab Results   Component Value Date    GLUCOSE 75 07/29/2015    GLUCOSE 75 07/29/2015    BUN 30* 07/29/2015    BUN 30* 07/29/2015    CO2 26 07/29/2015    CO2 26 07/29/2015    CREATININE 0.43* 07/29/2015    CREATININE 0.43* 07/29/2015    K 4.1 07/29/2015    K 4.1 07/29/2015    NA 142 07/29/2015    NA 142 07/29/2015    CL 107 07/29/2015    CL 107 07/29/2015    CALCIUM 7.6* 07/29/2015    CALCIUM 7.6* 07/29/2015     Lab Results   Component Value Date    WBC 7.0 07/31/2015    HGB 9.0* 07/31/2015    HCT 27.6* 07/31/2015    MCV 92.3 07/31/2015    PLT 562*  07/31/2015     No results found for: PTT, INR  Lab Results   Component Value Date    PREALBUMIN 16.0* 07/09/2015     Lab Results   Component Value Date    ALBUMIN <1.5* 07/29/2015    ALBUMIN <1.5* 07/29/2015     No results found for: ESR, CRP

## 2015-08-01 LAB — BASIC METABOLIC PANEL
Anion Gap: 6 mmol/L (ref 3–16)
BUN: 28 mg/dL (ref 7–25)
CO2: 26 mmol/L (ref 21–33)
Calcium: 7.5 mg/dL (ref 8.6–10.3)
Chloride: 108 mmol/L (ref 98–110)
Creatinine: 0.42 mg/dL (ref 0.60–1.30)
Glucose: 73 mg/dL (ref 70–100)
Osmolality, Calculated: 294 mOsm/kg (ref 278–305)
Potassium: 4.3 mmol/L (ref 3.5–5.3)
Sodium: 140 mmol/L (ref 133–146)
eGFR AA CKD-EPI: >90 See note.
eGFR NONAA CKD-EPI: >90 See note.

## 2015-08-01 LAB — CBC
Hematocrit: 27 % (ref 35.0–45.0)
Hemoglobin: 8.9 g/dL (ref 11.7–15.5)
MCH: 30.2 pg (ref 27.0–33.0)
MCHC: 32.8 g/dL (ref 32.0–36.0)
MCV: 91.9 fL (ref 80.0–100.0)
MPV: 6.8 fL (ref 7.5–11.5)
Platelets: 568 10*3/uL (ref 140–400)
RBC: 2.94 10*6/uL (ref 3.80–5.10)
RDW: 21.7 % (ref 11.0–15.0)
WBC: 8.3 10*3/uL (ref 3.8–10.8)

## 2015-08-01 LAB — RENAL FUNCTION PANEL W/EGFR
Albumin: <1.5 g/dL (ref 3.5–5.7)
Anion Gap: 6 mmol/L (ref 3–16)
BUN: 28 mg/dL (ref 7–25)
CO2: 26 mmol/L (ref 21–33)
Calcium: 7.5 mg/dL (ref 8.6–10.3)
Chloride: 108 mmol/L (ref 98–110)
Creatinine: 0.42 mg/dL (ref 0.60–1.30)
Glucose: 73 mg/dL (ref 70–100)
Osmolality, Calculated: 294 mOsm/kg (ref 278–305)
Phosphorus: 3.6 mg/dL (ref 2.1–4.7)
Potassium: 4.3 mmol/L (ref 3.5–5.3)
Sodium: 140 mmol/L (ref 133–146)
eGFR AA CKD-EPI: >90 See note.
eGFR NONAA CKD-EPI: >90 See note.

## 2015-08-01 LAB — POC GLU MONITORING DEVICE: POC Glucose Monitoring Device: 103 mg/dL (ref 70–100)

## 2015-08-01 LAB — HEMOGLOBIN A1C: Hemoglobin A1C: 4.7 % (ref 4.8–6.4)

## 2015-08-01 LAB — VANCOMYCIN, TROUGH: Vancomycin Tr: 10.6 ug/mL (ref 10.0–20.0)

## 2015-08-01 LAB — PHOSPHORUS: Phosphorus: 3.6 mg/dL (ref 2.1–4.7)

## 2015-08-01 LAB — MAGNESIUM: Magnesium: 1.8 mg/dL (ref 1.5–2.5)

## 2015-08-01 MED ORDER — TPN ADULT (WCH/Drake)
10 | INTRAVENOUS | Status: AC
Start: 2015-08-01 — End: 2015-08-01

## 2015-08-01 MED ORDER — TPN ADULT (WCH/Drake)
4 | INTRAMUSCULAR | Status: AC
Start: 2015-08-01 — End: 2015-08-02
  Administered 2015-08-01: 21:00:00 via INTRAVENOUS

## 2015-08-01 MED FILL — FAMOTIDINE (PF) 20 MG/2 ML INTRAVENOUS SOLUTION: 20 20 mg/2 mL | INTRAVENOUS | Qty: 2

## 2015-08-01 MED FILL — CLOPIDOGREL 75 MG TABLET: 75 75 mg | ORAL | Qty: 1

## 2015-08-01 MED FILL — TRAVASOL 10 % INTRAVENOUS SOLUTION: 10 10 % | INTRAVENOUS | Qty: 799

## 2015-08-01 MED FILL — VANCOMYCIN 1,000 MG INTRAVENOUS INJECTION: 1000 1000 mg | INTRAVENOUS | Qty: 1000

## 2015-08-01 MED FILL — HYDROMORPHONE 2 MG/ML INJECTION SYRINGE: 2 2 mg/mL | INTRAMUSCULAR | Qty: 2

## 2015-08-01 MED FILL — PIPERACILLIN-TAZOBACTAM 3.375 GRAM INTRAVENOUS SOLUTION: 3.375 3.375 gram | INTRAVENOUS | Qty: 1

## 2015-08-01 MED FILL — FLUCONAZOLE 200 MG/100 ML IN SOD. CHLORIDE (ISO) INTRAVENOUS PIGGYBACK: 200 200 mg/100 mL | INTRAVENOUS | Qty: 100

## 2015-08-01 MED FILL — ENOXAPARIN 40 MG/0.4 ML SUBCUTANEOUS SYRINGE: 40 40 mg/0.4 mL | SUBCUTANEOUS | Qty: 0.4

## 2015-08-01 NOTE — Unmapped (Signed)
Patient has been in bed all day and not wanting to cooperate with care, wound care called she is ready for care.  Morse fall scale 35, alert and oriented times 4, calls appropriately, tolerating TPN.  Fistula pouches draining brown fluid, and bilateral nephrostomy tubes with yellow urine, no need for alarms

## 2015-08-01 NOTE — Unmapped (Signed)
Problem: Potential for Compromised Skin Integrity  Goal: Skin integrity is maintained or improved  Assess and monitor skin integrity. Identify patients at risk for skin breakdown on admission and per policy. Collaborate with interdisciplinary team and initiate plans and interventions as needed.   Outcome: Progressing  Keep skin clean and dry, avoid shearing, turn pt, relieve pressure to bony prominences, turn patient and encourage use of moisturizer on skin.

## 2015-08-01 NOTE — Unmapped (Signed)
[name]  Nutrition Progress Note    Nutrition Diagnosis:   1. Altered GI function - Continues    New Nutrition Diagnosis:  No    Diet: NPO with ice chips and sips of clears  TPN: AA 80g, DEX 293g, LIPID 30d per day to provide TV and kcal 1596  Weight: (!) 85 lb 6.4 oz (38.737 kg) (07/31/15 1400)  Skin Condition:  Coccyx improving, mid abd fistula improving, right foot declining  Edema: BLE nonpitting    Labs:  Lab Results   Component Value Date    ALT 22 07/29/2015    AST 22 07/29/2015    ALKPHOS 181* 07/29/2015    BILITOT 0.3 07/29/2015     Lab Results   Component Value Date    TRIG 114 07/29/2015     Lab Results   Component Value Date    CREATININE 0.42* 08/01/2015    CREATININE 0.42* 08/01/2015    BUN 28* 08/01/2015    BUN 28* 08/01/2015    NA 140 08/01/2015    NA 140 08/01/2015    K 4.3 08/01/2015    K 4.3 08/01/2015    CL 108 08/01/2015    CL 108 08/01/2015    CO2 26 08/01/2015    CO2 26 08/01/2015     Lab Results   Component Value Date    OSMOLALITY 294 08/01/2015    OSMOLALITY 294 08/01/2015     Lab Results   Component Value Date    CALCIUM 7.5* 08/01/2015    CALCIUM 7.5* 08/01/2015    PHOS 3.6 08/01/2015    PHOS 3.6 08/01/2015    GLUCOSE 73 08/01/2015    GLUCOSE 73 08/01/2015     Lab Results   Component Value Date    HGBA1C 4.7* 08/01/2015     Lab Results   Component Value Date    WBC 8.3 08/01/2015    HGB 8.9* 08/01/2015    HCT 27.0* 08/01/2015    MCV 91.9 08/01/2015    PLT 568* 08/01/2015         Component Value Date/Time    POCGMD 103* 08/01/2015 0526    POCGMD 112* 07/31/2015 0631    POCGMD 105* 07/31/2015 0110    POCGMD 100 07/30/2015 0532    POCGMD 101* 07/29/2015 2217     No results found for: VITD25H      Scheduled Meds:  ??? clopidogrel  75 mg Per NG tube Daily 0900   ??? enoxaparin  40 mg Subcutaneous Daily 0900   ??? famotidine (PF)  20 mg Intravenous BID   ??? fentaNYL  1 patch Transdermal Q72H   ??? fentaNYL  1 patch Transdermal Q72H   ??? fluconazole in NaCl (iso-osm)  200 mg Intravenous  Q24H   ??? insulin regular  0-5 Units Subcutaneous Daily 0900   ??? piperacillin-tazobactam (ZOSYN) IV extended interval  3.375 g Intravenous Q8H   ??? vancomycin  1,000 mg Intravenous Q24H     Continuous Infusions:  ??? TPN ADULT ([facility]) 69.8 mL/hr at 07/31/15 1720   ??? TPN ADULT ([facility])       PRN Meds:.ALPRAZolam, alteplase, dextrose, dextrose 50 % in water (D50W), heparin lock flush, HYDROmorphone, ipratropium-albuterol, LORazepam, ondansetron    Note: Weight 4/15 88#, 4/19 85#.  Weight loss continues.  TPN increased 4/17.  Adequate hydration and BS control indicated.  Alk Phos 181, AST/ALT WNL.  Discussed with patient and pharmacy.    Nutrition Prescription:   Food and Nutrition Therapy: Rec TPN (  with next bag to start 4/21): AA 80g, Dex 293g, Lipid 35g (per day) to provide approx 1667 kcal (~ 43 kcal/kg)    Monitoring: weights, TPN/tol, labs    Plan of Care:Update/revised    Follow Up: Other: 3-5 days    Catha Gosselin, MS, RD, CSG, [place]  Pager [phone]  Office [phone]    08/01/2015 11:15 AM

## 2015-08-01 NOTE — Unmapped (Signed)
Clinical Pharmacy Service - TPN Progress Note    Linda Ponce is a 45 y.o. female followed by the pharmacy department for monitoring and adjustment of TPN    Assessment/Plan:   1) Continue continuous TPN with electrolytes, macronutrients and additives from yesterday  2) Electrolytes listed in chart below: added Calcium gluconate 9.37meq; adjusted Ca 8.5   3) Routine labs schedule Monday and Thursday  4) Pharmacy will continue to monitor electrolytes and make necessary adjustments.      TPN Electrolytes  ??Sodium Chloride: 80 mEq  ??Sodium Acetate: 0 mEq  ??Sodium Phosphate: 0 mmol  ??Magnesium Sulfate: 16 mEq  ??Calcium Gluconate: 9.6 mEq  ??Potassium Chloride: 20 mEq  ??Potassium Acetate: 0 mEq  ??Potassium Phosphate: 15 mmol?????????? TPN Macronutrients  ??Volume: 1675 mL  ??Rate: 69.8 mL/hr  ??Amino Acids: 47.7 g/L ??  ??Dextrose: 175 g/L ??  ??Lipids: 16.7 g/L ?????????? TPN Additives  ??MVI 10 mL  ??TE 1 mL  ??Ascorbic Acid 200mg  ??????????   ????????                              Subjective/Objective Information:    Recent Labs      08/01/15   0621   NA  140   140   CL  108   108   CO2  26   26   BUN  28*   28*   CREATININE  0.42*   0.42*   K  4.3   4.3   PHOS  3.6   3.6   MG  1.8   CALCIUM  7.5*   7.5*   ALBUMIN  <1.5*   GLUCOSE  73   73     Recent Labs      07/30/15   0532  07/31/15   0110  07/31/15   0631  08/01/15   0526   POCGMD  100  105*  112*  103*     Corrected Calcium = (0.8*(4mg /dL - Albumin))+Serum Ca     Thank you for the consult,  RACHEL COOK, PHARMD   08/01/2015 9:08 AM

## 2015-08-01 NOTE — Unmapped (Signed)
Pt Fentanyl patch wasted with [employer].

## 2015-08-01 NOTE — Unmapped (Signed)
Problem: Knowledge Deficit  Goal: Patient/family/caregiver demonstrates understanding of disease process, treatment plan, medications, and discharge instructions  Complete learning assessment and assess knowledge base.   Outcome: Progressing  Reviewed to call for needs and concerns

## 2015-08-01 NOTE — Unmapped (Addendum)
Pharmacy Department - Vancomycin Dosing      Linda Ponce is a 45 y.o. female that is being followed by the clinical pharmacy department for monitoring and adjustment of Vancomycin therapy.    Indication for treatment: peritoneal abscess  Goal trough: 10-15 mg/L  Anticipated stop date: TBD  Other antimicrobials: piperacillin-tazobactam 3.375 g IV q8h extended infusion       Fluconazole 200 mg IV q24h    Plan:  1. Current trough 10.6 and within goal; drawn appropriately  2. Keep current dose of 1gm IV daily  3. ID consulted to clarify stop date  4. Trough scheduled for Sunday 4/23 at 0830    Cultures: No results found for: ISO1, ISO2, ISO3, ISO4  WBC   Date Value Ref Range Status   08/01/2015 8.3 3.8 - 10.8 10E3/uL Final   07/31/2015 7.0 3.8 - 10.8 10E3/uL Final   07/29/2015 9.1 3.8 - 10.8 10E3/uL Final     Temp Readings from Last 3 Encounters:   08/01/15 97.7 ??F (36.5 ??C) Oral   07/05/15 97.6 ??F (36.4 ??C)      No results for input(s): VANCORANDOM in the last 72 hours.  Recent Labs      04 /20/17   0830   VANCOTROUGH  10.6     CREATININE   Date Value Ref Range Status   08/01/2015 0.42* 0.60 - 1.30 mg/dL Final   16/01/9603 5.40* 0.60 - 1.30 mg/dL Final   98/02/9146 8.29* 0.60 - 1.30 mg/dL Final   56/21/3086 5.78* 0.60 - 1.30 mg/dL Final     Pharmacy will continue to monitor renal function and will adjust accordingly    Thank you for the consult    RACHEL COOK 08/01/2015 12:56 PM   Tillie Fantasia PharmD  Clinical Staff Pharmacist  [name] [place]  [phone]

## 2015-08-01 NOTE — Unmapped (Signed)
Hospitalist Progress Note    08/01/2015     8:14 AM    Linda Ponce   LOS: 24 days       HPI;    Patient is s/p OR on 05/31/2015 with [facility] for incision and drainage of complex abdominal wall abscess, rectal EUA, cystourethroscopy, bilateral retrograde pyelograms and removal old stent and placement of left 6x24 firm stent and right ureter embolization, right nephrostomy tube exchange, left nephrostomy tube placement and pelvic drain exchange on 06/06/2015 at a hospital in [place]. She was admitted to [facility] with severe sepsis secondary to peritonitis with stool coming from multiple abdominal wounds. She was seen by Critical Care, general surgery, and ID. General surgery recommended NPO and bowel rest and TPN has been started. She is receiving continued dressing changes and nephrostomy tube care. In addition, she was noted to have gangrene of the right foot and Dr. Donzetta Sprung performed angiogram, thrombectomy, and EIA stent on 06/27/15. Dr. Simeon Craft has evaluated and will follow as outpatient to consider transmetatarsal amputation at a later date. Patient is hemodynamically stable. She was  to [facility] and will need continued wound care, IV Vanc and Zosyn, and TPN.  She was sent back to [facility] on 07/05/2015  with abdominal pain and leukocytosis.CT scan showed multiple abdominal abscesses with large pelvic abscess . Her Hgb was 6.8 in ED and she was transfused 1 unit of blood.     Subjective & Interval History;    No new change.  Abdominal pain and right toe PAIN IS STILL THERE BUT UNDER REASONABLE CONTROL WITH CURRENT REGIMEN.  +anxiety.   No fever or chill.  No SOB, CP or palpitation.        Review of Systems:   as above    PMH: Unchanged from H&P.   PSH: Unchanged from H&P.   Family medical history: Unchanged from H&P.   Social history: Unchanged from H&P      Scheduled Meds:  ??? clopidogrel  75 mg Per NG tube Daily 0900   ??? enoxaparin  40 mg Subcutaneous Daily 0900   ??? famotidine  (PF)  20 mg Intravenous BID   ??? fentaNYL  1 patch Transdermal Q72H   ??? fentaNYL  1 patch Transdermal Q72H   ??? fluconazole in NaCl (iso-osm)  200 mg Intravenous Q24H   ??? insulin regular  0-5 Units Subcutaneous Daily 0900   ??? piperacillin-tazobactam (ZOSYN) IV extended interval  3.375 g Intravenous Q8H   ??? vancomycin  1,000 mg Intravenous Q24H     Continuous Infusions:  ??? TPN ADULT ([facility]) 69.8 mL/hr at 07/31/15 1720     PRN Meds:ALPRAZolam, alteplase, dextrose, dextrose 50 % in water (D50W), heparin lock flush, HYDROmorphone, ipratropium-albuterol, LORazepam, ondansetron    Objective:    Vital signs in last 24 hours:  Temp:  [97.8 ??F (36.6 ??C)] 97.8 ??F (36.6 ??C)  Heart Rate:  [116-121] 121  Resp:  [16-20] 20  BP: (101-103)/(57) 101/57 mmHg    Physical Examination:    GENERAL: The patient is awake,not in acute cardiorespiratory distress. Looking chronically sick.  HEENT:Atraumatic.Normocephalic. PERRLA, EOMI. No icterus, no pallor, no jugular venous distention..No ear or nose discharge.  NECK: Supple. Trachea is in midline. No Thyromegaly  CHEST: Symmetric. Clear to auscultation, bilateral equal air entry. No wheezing or rhonchi.  CARDIOVASCULAR: S1, S2, Regular pulse. No murmur or gallops. No JVD.  ABDOMEN: Whole anterior abdomen is covered with pouch. Brownish fistula output noted.Bilateral nephrostomy tubes.  EXTREMITIES:  There is + BLE edema . Very tender to touch both legs.  Right toes are gangrenous.   Skin;No bruise,No rash.No petechiae.  CNS; CN II-XII grossly intact. No focal neurological deficit.       Intake/Output last 3 shifts:  I/O last 3 completed shifts:  In: 3420 [P.O.:480; I.V.:359]  Out: 650 [Urine:650]    Recent Labs;                           Lab name 07/29/15  0409 07/31/15  0519 08/01/15  0621   WBC 9.1 7.0 8.3   HEMOGLOBIN 9.0* 9.0* 8.9*   HEMATOCRIT 27.6* 27.6* 27.0*   MEAN CORPUSCULAR VOLUME 92.8 92.3 91.9   PLATELETS 602* 562* 568*                                                             Lab name 07/25/15  0626 07/29/15  0409 08/01/15  0621   SODIUM 139 142   142 140   140   POTASSIUM 4.4 4.1   4.1 4.3   4.3   CREATININE 0.54* 0.43*   0.43* 0.42*   0.42*   BUN 35* 30*   30* 28*   28*   CHLORIDE 109 107   107 108   108   CO2 26 26   26 26   26    PHOSPHORUS 3.8 3.5   3.5 3.6   3.6   MAGNESIUM 1.9 1.9 1.8                              Lab name 07/15/15  0840 07/22/15  0604 07/26/15  0746 07/29/15  0409   AST 93* 22 22 22    ALT 38 29 21 22    BILIRUBIN TOTAL 0.8 0.3 0.3 0.3                          Lab name 07/05/15  0702  07/09/15  0544  07/26/15  0746 07/29/15  0409 08/01/15  0621   PREALBUMIN 14.0*  --  16.0*  --   --   --   --    ALBUMIN  --   < > 1.5*  < > <1.5* <1.5*   <1.5* <1.5*   < > = values in this interval not displayed.                                                                                                   Lab name 07/25/15  0626 07/29/15  0409   CALCIUM 8.0* 7.6*   7.6*   PHOSPHORUS 3.8 3.5   3.5  Lab name 07/22/15  0604 07/29/15  0409   TRIGLYCERIDES 125 114                            Lab name 07/22/15  0604 07/27/15  0900 07/29/15  0830   VANCOMYCIN TROUGH 11.4 14.9 10.8       Diet;    Diet Orders          TPN ADULT ([facility]) at 69.8 mL/hr starting at 04/19 1800    Diet NPO effective now Except for: except ice chips, except sips of clears starting at 03/28 5956          Future Appointments:  No future appointments.    Assessment/Plan:        1. Peritoneal abscess- Mixed flora, pigtail drain 06/26/15 Refused followup CT, still requires one. ID consult-  Vanco/Zosyn/Diflucan. Trending down now.   2. Enterocutaneous Fistula- wound care, bowel rest. Still has significant output.  3. Severe intractable acute on chronic pain syndrome- receiving Dilaudid IV BID prn, fentanyl patch dose increased  4. Thrombocytosis. uncertain etiology- follow closely. Likely reactive.  5. Cachexia-   continue TPN, recheck PAB. Albumin is trending down.    6. Ischemic  feet/arterial thromboemboli- The patient had a clot in her femoral artery right leg that was cleared by vascular surgery.  However the patient ended up with necrosis at her distal digits which may be a combination of lack of blood flow and the use of pressors. Refusing heparin, aspirin and Plavix. Refused all heparin injections previously as well. Canceled aspirin but continue to encourage Plavix..     7. History of colon cancer s/p exenteration with bilateral nephrostomy tubes  8. Recurrent presacral abscesses  9. Debility- PT/OT  10. Guarded prognosis.     Wound care notes reviewed.  Toniann Fail discussed with Dr. Simeon Craft, there is concern that this could be wet gangrene vs. being related to friction, he recommended mepitel dressing followed by dry cover dressing, closest dressing stocked is Adaptic touch, he also indicated to avoid adding any extra moisture to wound bed. ??    Recent labs reviewed.    Prophylaxis;  GI Prophylaxis: Pepcid  DVT Prophylaxis: Lovenox     Code Status: Full Code      Corneluis Allston MD.       08/01/2015

## 2015-08-01 NOTE — Unmapped (Signed)
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   Adequate intake via PO and TPN to support healing of areas on skin as diagnosis allows.   Outcome: Not Progressing  Weight down to 85#  TPN tol - increased pending for 4/21  Adequate hydration indicated

## 2015-08-01 NOTE — Unmapped (Signed)
Patient alert and oriented x4 upon assessment this evening.  Speech is clear, and patient appears anxious .  No c/o cough or sore throat, and lung sounds are clear. Vitals are within normal limits.  Patient has  c/o pain and discomfort with prn pain medication administration which was effective, and no s/s of distress are evident at this time.  Bed mobility and ADL care are performed with complete staff assistance and without complication. Patient takes medications via IV well.  Over bed table and call light is within reach.  Side rails are up x2.  TPN infusing without any complications, will continue to monitor.

## 2015-08-02 ENCOUNTER — Encounter: Attending: Podiatrist | Primary: Internal Medicine

## 2015-08-02 LAB — POC GLU MONITORING DEVICE
POC Glucose Monitoring Device: 106 mg/dL (ref 70–100)
POC Glucose Monitoring Device: 106 mg/dL — ABNORMAL HIGH (ref 70–100)
POC Glucose Monitoring Device: 91 mg/dL (ref 70–100)

## 2015-08-02 MED ORDER — TPN ADULT (WCH/Drake)
INTRAVENOUS | Status: AC
Start: 2015-08-02 — End: 2015-08-03
  Administered 2015-08-02: 22:00:00 via INTRAVENOUS

## 2015-08-02 MED FILL — HYDROMORPHONE 2 MG/ML INJECTION SYRINGE: 2 2 mg/mL | INTRAMUSCULAR | Qty: 2

## 2015-08-02 MED FILL — ONDANSETRON HCL (PF) 4 MG/2 ML INJECTION SOLUTION: 4 4 mg/2 mL | INTRAMUSCULAR | Qty: 2

## 2015-08-02 MED FILL — VANCOMYCIN 1,000 MG INTRAVENOUS INJECTION: 1000 1000 mg | INTRAVENOUS | Qty: 1000

## 2015-08-02 MED FILL — PIPERACILLIN-TAZOBACTAM 3.375 GRAM INTRAVENOUS SOLUTION: 3.375 3.375 gram | INTRAVENOUS | Qty: 1

## 2015-08-02 MED FILL — FENTANYL 100 MCG/HR TRANSDERMAL PATCH: 100 100 mcg/hr | TRANSDERMAL | Qty: 1

## 2015-08-02 MED FILL — FAMOTIDINE (PF) 20 MG/2 ML INTRAVENOUS SOLUTION: 20 20 mg/2 mL | INTRAVENOUS | Qty: 2

## 2015-08-02 MED FILL — CLOPIDOGREL 75 MG TABLET: 75 75 mg | ORAL | Qty: 1

## 2015-08-02 MED FILL — ENOXAPARIN 40 MG/0.4 ML SUBCUTANEOUS SYRINGE: 40 40 mg/0.4 mL | SUBCUTANEOUS | Qty: 0.4

## 2015-08-02 MED FILL — TRAVASOL 10 % INTRAVENOUS SOLUTION: 10 10 % | INTRAVENOUS | Qty: 799

## 2015-08-02 MED FILL — FLUCONAZOLE 200 MG/100 ML IN SOD. CHLORIDE (ISO) INTRAVENOUS PIGGYBACK: 200 200 mg/100 mL | INTRAVENOUS | Qty: 100

## 2015-08-02 NOTE — Unmapped (Signed)
Problem: Discharge Planning  Goal: Patient???s discharge needs are met  Collaborate with interdisciplinary team and initiate plans and interventions as needed.   Outcome: Progressing  Ongoing discharge planning    Comments:   The case management department provided an update regarding patient???s plan of care today during Interdisciplinary Rounds.    With:   Therapy, WCT, Nursing, SW, RNCM    Patient reports pain is still not under control.  Therapy got her up in chair yesterday, but pain is very limiting.  WCT managing fistula's and report pouches work well.  Patient appears depressed - psychology consult placed. Follow up with Dr. Luan Pulling 08/06/15.    Discharge recommendation is to SNF    Barriers to discharge at this time include: extensive wound care, IV medications

## 2015-08-02 NOTE — Unmapped (Signed)
Hospitalist Progress Note    08/02/2015     8:25 AM    Linda Ponce   LOS: 25 days       HPI;    Patient is s/p OR on 05/31/2015 with Oncology Surgery and Urology for incision and drainage of complex abdominal wall abscess, rectal EUA, cystourethroscopy, bilateral retrograde pyelograms and removal old stent and placement of left 6x24 firm stent and right ureter embolization, right nephrostomy tube exchange, left nephrostomy tube placement and pelvic drain exchange on 06/06/2015 at a hospital in [place]. She was admitted to [facility] with severe sepsis secondary to peritonitis with stool coming from multiple abdominal wounds. She was seen by Critical Care, general surgery, and ID. General surgery recommended NPO and bowel rest and TPN has been started. She is receiving continued dressing changes and nephrostomy tube care. In addition, she was noted to have gangrene of the right foot and Dr. Donzetta Sprung performed angiogram, thrombectomy, and EIA stent on 06/27/15. Dr. Simeon Craft has evaluated and will follow as outpatient to consider transmetatarsal amputation at a later date. Patient is hemodynamically stable. She was  to [facility] and will need continued wound care, IV Vanc and Zosyn, and TPN.  She was sent back to [facility] on 07/05/2015  with abdominal pain and leukocytosis.CT scan showed multiple abdominal abscesses with large pelvic abscess . Her Hgb was 6.8 in ED and she was transfused 1 unit of blood.     Subjective & Interval History;    Pt examined at the bedside.  Looking tearful. States that pain is not under control.  Wound dressing was changed by RN today.  Fistula has significant output.  Right toes are gangrenous.    D/W RN. She states that pain is under control with most of times.   There is lot of anxiety and she is not taking her PRN Xanax. Just want dilaudid for pain. Already on high dose Fentanyl patch. Could not r/o narcotics abuse in critically sick pt.        Review of Systems:   as  above    PMH: Unchanged from H&P.   PSH: Unchanged from H&P.   Family medical history: Unchanged from H&P.   Social history: Unchanged from H&P      Scheduled Meds:  ??? clopidogrel  75 mg Per NG tube Daily 0900   ??? enoxaparin  40 mg Subcutaneous Daily 0900   ??? famotidine (PF)  20 mg Intravenous BID   ??? fentaNYL  1 patch Transdermal Q72H   ??? fentaNYL  1 patch Transdermal Q72H   ??? fluconazole in NaCl (iso-osm)  200 mg Intravenous Q24H   ??? insulin regular  0-5 Units Subcutaneous Daily 0900   ??? piperacillin-tazobactam (ZOSYN) IV extended interval  3.375 g Intravenous Q8H   ??? vancomycin  1,000 mg Intravenous Q24H     Continuous Infusions:  ??? TPN ADULT ([facility]) 69.8 mL/hr at 08/01/15 1703   ??? TPN ADULT ([facility])       PRN Meds:ALPRAZolam, alteplase, dextrose, dextrose 50 % in water (D50W), heparin lock flush, HYDROmorphone, ipratropium-albuterol, LORazepam, ondansetron    Objective:    Vital signs in last 24 hours:  Temp:  [97.6 ??F (36.4 ??C)-98.6 ??F (37 ??C)] 97.6 ??F (36.4 ??C)  Heart Rate:  [125] 125  Resp:  [16-20] 18  BP: (101-113)/(63-71) 101/63 mmHg    Physical Examination:    GENERAL: The patient is awake,not in acute cardiorespiratory distress. Looking chronically sick.  HEENT:Atraumatic.Normocephalic. PERRLA, EOMI. No icterus, no  pallor, no jugular venous distention..No ear or nose discharge.  NECK: Supple. Trachea is in midline. No Thyromegaly  CHEST: Symmetric. Clear to auscultation, bilateral equal air entry. No wheezing or rhonchi.  CARDIOVASCULAR: S1, S2, Regular pulse. No murmur or gallops. No JVD.  ABDOMEN: Whole anterior abdomen is covered with pouch. Brownish fistula output noted.Bilateral nephrostomy tubes.  EXTREMITIES: There is + BLE edema . Very tender to touch both legs.  Right toes are gangrenous.   Skin;No bruise,No rash.No petechiae.  CNS; CN II-XII grossly intact. No focal neurological deficit.       Intake/Output last 3 shifts:  I/O last 3 completed shifts:  In: 3570.4 [P.O.:120; I.V.:1362; IV  Piggyback:202]  Out: 1750 [Urine:1150; Drains:600]    Recent Labs;                           Lab name 07/29/15  0409 07/31/15  0519 08/01/15  0621   WBC 9.1 7.0 8.3   HEMOGLOBIN 9.0* 9.0* 8.9*   HEMATOCRIT 27.6* 27.6* 27.0*   MEAN CORPUSCULAR VOLUME 92.8 92.3 91.9   PLATELETS 602* 562* 568*                                                            Lab name 07/25/15  0626 07/29/15  0409 08/01/15  0621   SODIUM 139 142   142 140   140   POTASSIUM 4.4 4.1   4.1 4.3   4.3   CREATININE 0.54* 0.43*   0.43* 0.42*   0.42*   BUN 35* 30*   30* 28*   28*   CHLORIDE 109 107   107 108   108   CO2 26 26   26 26   26    PHOSPHORUS 3.8 3.5   3.5 3.6   3.6   MAGNESIUM 1.9 1.9 1.8                              Lab name 07/15/15  0840 07/22/15  0604 07/26/15  0746 07/29/15  0409   AST 93* 22 22 22    ALT 38 29 21 22    BILIRUBIN TOTAL 0.8 0.3 0.3 0.3                          Lab name 07/05/15  0702  07/09/15  0544  07/26/15  0746 07/29/15  0409 08/01/15  0621   PREALBUMIN 14.0*  --  16.0*  --   --   --   --    ALBUMIN  --   < > 1.5*  < > <1.5* <1.5*   <1.5* <1.5*   < > = values in this interval not displayed.  Lab name 07/29/15  0409 08/01/15  0621   CALCIUM 7.6*   7.6* 7.5*   7.5*   PHOSPHORUS 3.5   3.5 3.6   3.6                              Lab name 07/22/15  0604 07/29/15  0409   TRIGLYCERIDES 125 114                            Lab name 07/27/15  0900 07/29/15  0830 08/01/15  0830   VANCOMYCIN TROUGH 14.9 10.8 10.6       Diet;    Diet Orders          TPN ADULT ([facility]) at 69.8 mL/hr starting at 04/21 1800    TPN ADULT ([facility]) at 69.8 mL/hr starting at 04/20 1800    Diet NPO effective now Except for: except ice chips, except sips of clears starting at 03/28 7829          Future Appointments:  No future appointments.    Assessment/Plan:      1. Peritoneal abscess- Mixed flora, pigtail drain 06/26/15 Refused followup CT, still requires  one. ID consult-  Vanco/Zosyn/Diflucan.   2. Enterocutaneous Fistula- wound care, bowel rest. Still has significant output.  3. Severe intractable acute on chronic pain syndrome- receiving Dilaudid IV every 3 hr  prn, fentanyl patch 125 mcg.    4. Thrombocytosis. uncertain etiology- follow closely. Likely reactive.  5. Cachexia-   continue TPN, recheck PAB. Albumin is trending down.  6. Sinus tachycardia. Likely related to pain. Monitor.    7. Ischemic feet/arterial thromboemboli- The patient had a clot in her femoral artery right leg that was cleared by vascular surgery.  However the patient ended up with necrosis at her distal digits which may be a combination of lack of blood flow and the use of pressors. Refusing heparin, aspirin and Plavix. Refused all heparin injections previously as well. Canceled aspirin but continue to encourage Plavix..     8. History of colon cancer s/p exenteration with bilateral nephrostomy tubes  9. Recurrent presacral abscesses  10. Debility- PT/OT  11. Guarded prognosis.   12. On TPN. 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 Cant tolerate PO. Weekly LFT while on TPN.      Prophylaxis;  GI Prophylaxis: Pepcid  DVT Prophylaxis: Lovenox     Code Status: Full Code      Cuinn Westerhold MD.       08/02/2015

## 2015-08-02 NOTE — Unmapped (Signed)
Problem: Knowledge Deficit  Goal: Patient/family/caregiver demonstrates understanding of disease process, treatment plan, medications, and discharge instructions  Complete learning assessment and assess knowledge base.   Educate to atb

## 2015-08-02 NOTE — Unmapped (Signed)
Clinical Pharmacy Service - TPN Progress Note    Linda Ponce is a 45 y.o. female followed by the pharmacy department for monitoring and adjustment of TPN    Assessment/Plan:   1) Continue continuous TPN with electrolytes, macronutrients and additives from yesterday  2) Electrolytes listed in chart below:  3) Per RD, Anne, increase lipids to 35gm pe rbag (21gm/L)  4) Pharmacy will continue to monitor electrolytes and make necessary adjustments.  5) Routine labs scheduled every Monday and Thursday      TPN Electrolytes  ??Sodium Chloride: 80 mEq  ??Sodium Acetate: 0 mEq  ??Sodium Phosphate: 0 mmol  ??Magnesium Sulfate: 16 mEq  ??Calcium Gluconate: 9.6 mEq  ??Potassium Chloride: 20 mEq  ??Potassium Acetate: 0 mEq  ??Potassium Phosphate: 15 mmol?????????? TPN Macronutrients  ??Volume: 1675 mL  ??Rate: 69.8 mL/hr  ??Amino Acids: 47.7 g/L ??  ??Dextrose: 175 g/L ??  ??Lipids: 21 g/L ?????????? TPN Additives  ??MVI 10 mL  ??TE 1 mL  ??Ascorbic Acid 200mg  ??????????   ????????                              Subjective/Objective Information:    Recent Labs      08/01/15   0621   NA  140   140   CL  108   108   CO2  26   26   BUN  28*   28*   CREATININE  0.42*   0.42*   K  4.3   4.3   PHOS  3.6   3.6   MG  1.8   CALCIUM  7.5*   7.5*   ALBUMIN  <1.5*   GLUCOSE  73   73     Recent Labs      07/31/15   0631  08/01/15   0526  08/01/15   2328  08/02/15   0622   POCGMD  112*  103*  106*  106*     Corrected Calcium = (0.8*(4mg /dL - Albumin))+Serum Ca     Thank you for the consult,  RACHEL COOK, PHARMD   08/02/2015 7:32 AM

## 2015-08-02 NOTE — Unmapped (Signed)
Assessment completed call light within reach atb continue

## 2015-08-02 NOTE — Unmapped (Signed)
Problem: Pain  Goal: LTG - Patient will manage pain with the appropriate technique/Intervention  Outcome: Progressing

## 2015-08-02 NOTE — Unmapped (Signed)
Problem: Fall Prevention  Goal: Patient will remain free of falls  Assess and monitor vitals signs, neurological status including level of consciousness and orientation. Reassess fall risk per hospital policy.    Ensure arm band on, uncluttered walking paths in room, adequate room lighting, call light and overbed table within reach, bed in low position, wheels locked, side rails up per policy, and non-skid footwear provided.   Outcome: Progressing  Patient's call light and overbed table within reach; armband present; adequate lighting; bed in low position; HOB elevated; brakes presently on; visual checks frequently throughout the shift.

## 2015-08-02 NOTE — Unmapped (Signed)
Pt alert and oriented, vital signs stable, complaining of constat pain, 4mg  Dilaudid IV PRN given as ordered, minor relief noted.

## 2015-08-02 NOTE — Unmapped (Signed)
Surgical wounds:??????  ??????  Left Lateral Abdomen Fistula???? Full Thickness. Measurements: L 4.5?? cm X W 1.5 cm X?? D 1 cm, has tunnel at 9 oclock that appears to tunnel over to larger wound/fistula of mid abdomen?? ??  Wounds progress: improved, smaller in size  Wound Assessment:??Moist pink tissue, edges with granulation and epithelial tissue. Is smaller in size, has area at 6 oclock with divit/dimpling. Large amount liquid brown effluent draining into foley bag.  Periwound skin with some erythema and slight denudement.  Pt reports this pouch has been intact since WCT changed last week, is beginning to peel around edges??  Treatment/Dressing:  Patient assisted with pouch removal and skin cleaning. Left abdominal fistula/wound being managed with Eakins pouch attach to gravity drainage bags. Clean wounds and surrounding skin with soap and water, pat dry. Attach Eakins pouch using Eakins rings to help with adherence. Use lsmaller pouch [phone] for left abdomen. Change pouches weekly and prn for leakages.     *Patient had been premedicated prior to care. She assisted with pouch removal and skin cleaning. Does become very anxious with care and tearful. Patient does help but does not appear that she could do entire pouch change without assistance. Due to her anxiety requires additional time for care. This one pouch change required 30 minutes.

## 2015-08-03 LAB — POC GLU MONITORING DEVICE
POC Glucose Monitoring Device: 94 mg/dL (ref 70–100)
POC Glucose Monitoring Device: 119 mg/dL — ABNORMAL HIGH (ref 70–100)
POC Glucose Monitoring Device: 109 mg/dL — ABNORMAL HIGH (ref 70–100)

## 2015-08-03 MED ORDER — TPN ADULT (WCH/Drake)
4 | INTRAMUSCULAR | Status: AC
Start: 2015-08-03 — End: 2015-08-04
  Administered 2015-08-03: 22:00:00 via INTRAVENOUS

## 2015-08-03 MED FILL — PIPERACILLIN-TAZOBACTAM 3.375 GRAM INTRAVENOUS SOLUTION: 3.375 3.375 gram | INTRAVENOUS | Qty: 1

## 2015-08-03 MED FILL — HYDROMORPHONE 2 MG/ML INJECTION SYRINGE: 2 2 mg/mL | INTRAMUSCULAR | Qty: 2

## 2015-08-03 MED FILL — CLOPIDOGREL 75 MG TABLET: 75 75 mg | ORAL | Qty: 1

## 2015-08-03 MED FILL — FAMOTIDINE (PF) 20 MG/2 ML INTRAVENOUS SOLUTION: 20 20 mg/2 mL | INTRAVENOUS | Qty: 2

## 2015-08-03 MED FILL — FENTANYL 25 MCG/HR TRANSDERMAL PATCH: 25 25 mcg/hr | TRANSDERMAL | Qty: 1

## 2015-08-03 MED FILL — ONDANSETRON HCL (PF) 4 MG/2 ML INJECTION SOLUTION: 4 4 mg/2 mL | INTRAMUSCULAR | Qty: 2

## 2015-08-03 MED FILL — TRAVASOL 10 % INTRAVENOUS SOLUTION: 10 10 % | INTRAVENOUS | Qty: 799

## 2015-08-03 MED FILL — VANCOMYCIN 1,000 MG INTRAVENOUS INJECTION: 1000 1000 mg | INTRAVENOUS | Qty: 1000

## 2015-08-03 MED FILL — ENOXAPARIN 40 MG/0.4 ML SUBCUTANEOUS SYRINGE: 40 40 mg/0.4 mL | SUBCUTANEOUS | Qty: 0.4

## 2015-08-03 MED FILL — FLUCONAZOLE 200 MG/100 ML IN SOD. CHLORIDE (ISO) INTRAVENOUS PIGGYBACK: 200 200 mg/100 mL | INTRAVENOUS | Qty: 100

## 2015-08-03 NOTE — Unmapped (Signed)
Patient resting in bed; no distress presently; prn medication given per patient's request; effective; drains and ostomy remain intact; call light in reach; visual checks during the shift.

## 2015-08-03 NOTE — Unmapped (Signed)
Problem: Potential for Compromised Skin Integrity  Goal: Skin integrity is maintained or improved  Assess and monitor skin integrity. Identify patients at risk for skin breakdown on admission and per policy. Collaborate with interdisciplinary team and initiate plans and interventions as needed.   Outcome: Progressing  Dressing remains dry and intact. Drains remains patent.

## 2015-08-03 NOTE — Unmapped (Signed)
[facility]  DEPARTMENT OF INFECTIOUS DISEASE  CONSULT  NOTE    Referring Physician: Corrinne Eagle, MD  Consult Attending: Audria Nine  Patient: Linda Ponce  MRN: [id number]  CSN: [id number]    Reason for Consult:     Intra-abdominal abscess    History of Present Illness     Linda Ponce is a 45 y.o. female on hospital day 47.  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  Presented to [facility] with abdominal distention and fevers.  Identified as having pelvic and intraabdominal abscess - drained by IR  - mixed enteric flora with no predominance.  Readmitted to [facility] with bleeding for wound.  Course complicated by ischemia to right fore foot..  Transferred back to [facility] for antibiotics.  On vanc, pip-tazo and fluconazole.    Limited history due to limited cooperation due to constant pain i abdomen.  Tolerating antbiotics.  Pain not well controlled.  Not willing to move.     Past Medical History     Past Medical History   Diagnosis Date   ??? Cancer    ??? Abscess of abdominal cavity    ??? Gangrene of foot    ??? AKI (acute kidney injury)    ??? Fistula    ??? Peritonitis and retroperitoneal infections    ??? Cachexia    ??? Hyperkalemia    ??? Dehydration    ??? Encephalopathies    ??? Ischemic foot        Past Surgical History     Past Surgical History   Procedure Laterality Date   ??? Colon surgery         Family History     History reviewed. No pertinent family history.    Social History     Social History     Social History   ??? Marital Status: Single     Spouse Name: N/A   ??? Number of Children: N/A   ??? Years of Education: N/A     Occupational History   ??? Not on file.     Social History Main Topics   ??? Smoking status: Never Smoker    ??? Smokeless tobacco: Not on file   ??? Alcohol Use: Not on file   ??? Drug Use: Not on file   ??? Sexual Activity: Not Currently     Other Topics Concern   ??? Not on file     Social History Narrative       Medications     Allergies:  Allergies   Allergen  Reactions   ??? Compazine [Prochlorperazine Edisylate]    ??? Latex, Natural Rubber        Home Meds:  There are no discharge medications for this patient.      Inpatient Meds:  Scheduled:  ??? clopidogrel  75 mg Per NG tube Daily 0900   ??? enoxaparin  40 mg Subcutaneous Daily 0900   ??? famotidine (PF)  20 mg Intravenous BID   ??? fentaNYL  1 patch Transdermal Q72H   ??? fentaNYL  1 patch Transdermal Q72H   ??? fluconazole in NaCl (iso-osm)  200 mg Intravenous Q24H   ??? insulin regular  0-5 Units Subcutaneous Daily 0900   ??? piperacillin-tazobactam (ZOSYN) IV extended interval  3.375 g Intravenous Q8H   ??? vancomycin  1,000 mg Intravenous Q24H     Continuous:  ??? TPN ADULT ([facility]) 69.8 mL/hr at 08/02/15 1740   ??? TPN ADULT ([facility])  ZOX:[id number], alteplase, dextrose, dextrose 50 % in water (D50W), heparin lock flush, HYDROmorphone, ipratropium-albuterol, LORazepam, ondansetron    Review of Systems     Review of systems not obtained due to patient factors.  Unwilling to talk secondary to pain    Vital Signs     Temp:  [98 ??F (36.7 ??C)-98.1 ??F (36.7 ??C)] 98 ??F (36.7 ??C)  Heart Rate:  [116-119] 116  Resp:  [15-18] 18  BP: (96-103)/(53-57) 96/53 mmHg    Intake/Output Summary (Last 24 hours) at 08/03/15 1535  Last data filed at 08/02/15 2142   Gross per 24 hour   Intake   2000 ml   Output    525 ml   Net   1475 ml       Physical Exam     Thin pale female - looks older than stated age.  No nodes  Mouth clean  Right IJ line TPN infusing  Cor S1 S2  Chest clear with reasonable air entry  Abdo - large ostomy pouch, Left groin drain, bilateral nephrostomy tubes,  Left lateral fistula    Right foot w dry gangrenous changes 1tst to fifth toes.    Laboratory Data         Lab name 07/26/15  0746 07/29/15  0409 07/31/15  0519 08/01/15  0621   WBC 13.3* 9.1 7.0 8.3   HEMOGLOBIN 9.4* 9.0* 9.0* 8.9*   HEMATOCRIT 29.5* 27.6* 27.6* 27.0*   MEAN CORPUSCULAR VOLUME 90.9 92.8 92.3 91.9   PLATELETS 667* 602* 562* 568*         Lab name  07/22/15  0604 07/25/15  0626 07/29/15  0409 08/01/15  0621   SODIUM 139 139 142   142 140   140   POTASSIUM 4.7 4.4 4.1   4.1 4.3   4.3   CHLORIDE 105 109 107   107 108   108   CO2 27 26 26   26 26   26    BUN 44* 35* 30*   30* 28*   28*   CREATININE 0.79 0.54* 0.43*   0.43* 0.42*   0.42*   GLUCOSE 78 70 75   75 73   73   CALCIUM 8.2* 8.0* 7.6*   7.6* 7.5*   7.5*   MAGNESIUM 2.3 1.9 1.9 1.8   PHOSPHORUS 4.7 3.8 3.5   3.5 3.6   3.6             Lab name 07/15/15  0840  07/22/15  0604 07/25/15  0626 07/26/15  0746 07/29/15  0409 08/01/15  0621   ALT 38  --  29  --  21 22  --    AST 93*  --  22  --  22 22  --    ALK PHOS 126*  --  166*  --  158* 181*  --    BILIRUBIN TOTAL 0.8  --  0.3  --  0.3 0.3  --    BILIRUBIN DIRECT 0.07  --  0.07  --  0.16 0.06  --    ALBUMIN 1.9*  < > 1.6*   1.6* 1.5* <1.5* <1.5*   <1.5* <1.5*   < > = values in this interval not displayed.        Invalid input(s): KEYTONESU  Specialty labs:    Microbiology results:  No results found for: ISO1, ISO2, ISO3  No results found for: [facility], PPD    Diagnostic Studies     CT abdomen 3/24  1. Multiple abscess, large pelvic abscess with surgical drain,  right issue rectal fossa.  2. 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  3. Granulation tissue enhancement or blood extravasation in the  lower abdominal wall as described  4. Proximity of the small bowel vasculature at the level of the  open wound on the right, side of active bleeding  5. Lower lobe bronchopneumonia and aspiration is also considered    Critical findings reviewed with Dr. Monna Fam and resident surgical        Assessment & Plan     Linda Ponce is a 45 y.o. female Medical problems being addressed in this encounter include the following:    45 yr female with colorectal carcinoma post resection with enterocutaneous fistua and intra abdominal abscess   IR drainage but no imaging post drainage - patient refuses additional CT    Has  completed > 4 weeks of antibiotics post drainage with normalization of WBC  Will complete 6 weeks IV therapy - projected stop 5/1  Will monitor for evidence of worsening infection      Signed:  Arbie Cookey, MD  08/03/2015, 3:35 PM  [phone]

## 2015-08-03 NOTE — Unmapped (Signed)
Hospitalist Progress Note    08/03/2015     5:12 PM    Linda Ponce   LOS: 26 days       HPI;    Principal Problem:    Fistula  Active Problems:    Abdominal abscess    Enterocutaneous fistula    Malnutrition      Subjective & Interval History;    Afebrile, hemodynamically stable,   Appears to be in no distress, awake, alert. Pain control slightly better, no excessive sedation noted, no new complaint    Review of Systems:  As above otherwise negative         PMH: Unchanged from H&P.   PSH: Unchanged from H&P.   Family medical history: Unchanged from H&P.   Social history: Unchanged from H&P      Scheduled Meds:  ??? clopidogrel  75 mg Per NG tube Daily 0900   ??? enoxaparin  40 mg Subcutaneous Daily 0900   ??? famotidine (PF)  20 mg Intravenous BID   ??? fentaNYL  1 patch Transdermal Q72H   ??? fentaNYL  1 patch Transdermal Q72H   ??? fluconazole in NaCl (iso-osm)  200 mg Intravenous Q24H   ??? insulin regular  0-5 Units Subcutaneous Daily 0900   ??? piperacillin-tazobactam (ZOSYN) IV extended interval  3.375 g Intravenous Q8H   ??? vancomycin  1,000 mg Intravenous Q24H     Continuous Infusions:  ??? TPN ADULT ([facility]) 69.8 mL/hr at 08/02/15 1740   ??? TPN ADULT ([facility])       PRN Meds:ALPRAZolam, alteplase, dextrose, dextrose 50 % in water (D50W), heparin lock flush, HYDROmorphone, ipratropium-albuterol, LORazepam, ondansetron    Objective:    Vital signs in last 24 hours:  Temp:  [98 ??F (36.7 ??C)-98.1 ??F (36.7 ??C)] 98.1 ??F (36.7 ??C)  Heart Rate:  [116-119] 119  Resp:  [15-18] 18  BP: (96-107)/(53-62) 107/62 mmHg    Physical Examination:     GENERAL: The patient is awake, alert, oriented?? not in acute cardiorespiratory distress, awakens easily  HEENT:Atraumatic.Normocephalic. PERRLA, EOMI. No icterus, no pallor, no jugular venous distention. ??  NECK: Supple. Trachea is in midline. No Thyromegaly. No Lymphadenopathy.  CHEST: Symmetric. Clear to auscultation, bilateral equal air entry. No wheezing or rhonchi.  CARDIOVASCULAR: S1,  S2, Regular pulse, tachy rate. No murmur or gallops. No JVD.  ABDOMEN: Soft, nontender, nondistended, positive bowel sounds.No viscera palpable.  EXTREMITIES: There is no edema in the lower extremities.?? Pulses are positive in all extremities, no popliteal lymphadenopathy or lymphangitis .(+) dry gangrene R foot  ??    Intake/Output last 3 shifts:  I/O last 3 completed shifts:  In: 3403 [P.O.:120; I.V.:2653; IV Piggyback:200]  Out: 1075 [Urine:850; Drains:225]    Recent Labs;  @SLAB @  No results found for: INR, PROTIME    ABG;  No results found for: PHART, PCO2, PO2ART, HCO3ART, BEART, HBO2PER, O2SATART        Last  Culture and Gram Stain;  No results found for: AEROBOT, ANABOT, LABGRAM, ISO2, ISO3, ISO4, ISO5, ISO6, PNAFISH      Diet;    Diet Orders          TPN ADULT ([facility]) at 69.8 mL/hr starting at 04/22 1800    TPN ADULT ([facility]) at 69.8 mL/hr starting at 04/21 1800    Diet NPO effective now Except for: except ice chips, except sips of clears starting at 03/28 1610          Future Appointments:  No future appointments.  Assessment/Plan:             ?? Abdominal abscess (07/04/2015)  ????????????????????????- continue abx vanc / zosyn?? and diflucan, abx thru 5/1   - wound care   - increased fentanyl patch to 125 mgc/hr total, does not want further increase      ????????????????????????  ?? 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   Pager# [phone]    08/03/2015

## 2015-08-03 NOTE — Unmapped (Signed)
Clinical Pharmacy Note - TPN Order    Linda Ponce is a 45 y.o. female that is being followed by the clinical pharmacy department for monitoring and adjustment of TPN.    Assessment/Plan:   1) Continue continuous TPN with electrolytes, macronutrients and additives from yesterday  2) Electrolytes listed in chart below:  3) Pharmacy will continue to monitor electrolytes and make necessary adjustments.  4) Routine labs scheduled every Monday and Thursday    Thank you.  Bonney Leitz, PharmD 08/03/2015 7:51 AM

## 2015-08-04 LAB — SED RATE: Sed Rate: 15 mm/h (ref 0–20)

## 2015-08-04 LAB — POC GLU MONITORING DEVICE
POC Glucose Monitoring Device: 131 mg/dL — ABNORMAL HIGH (ref 70–100)
POC Glucose Monitoring Device: 107 mg/dL (ref 70–100)

## 2015-08-04 LAB — C-REACTIVE PROTEIN: CRP: 96.9 mg/L — ABNORMAL HIGH (ref 1.0–10.0)

## 2015-08-04 LAB — CK: Total CK: 42 U/L (ref 30–223)

## 2015-08-04 MED ORDER — TPN ADULT (WCH/Drake)
30 | INTRAVENOUS | Status: AC
Start: 2015-08-04 — End: 2015-08-05
  Administered 2015-08-04: 22:00:00 via INTRAVENOUS

## 2015-08-04 MED ORDER — HYDROmorphone (DILAUDID) injection Syrg 4 mg
2 | Freq: Once | INTRAMUSCULAR | Status: AC
Start: 2015-08-04 — End: 2015-08-04
  Administered 2015-08-04: 21:00:00 4 mg via INTRAVENOUS

## 2015-08-04 MED FILL — HYDROMORPHONE 2 MG/ML INJECTION SYRINGE: 2 2 mg/mL | INTRAMUSCULAR | Qty: 2

## 2015-08-04 MED FILL — PIPERACILLIN-TAZOBACTAM 3.375 GRAM INTRAVENOUS SOLUTION: 3.375 3.375 gram | INTRAVENOUS | Qty: 1

## 2015-08-04 MED FILL — TRAVASOL 10 % INTRAVENOUS SOLUTION: 10 10 % | INTRAVENOUS | Qty: 799

## 2015-08-04 MED FILL — CLOPIDOGREL 75 MG TABLET: 75 75 mg | ORAL | Qty: 1

## 2015-08-04 MED FILL — FAMOTIDINE (PF) 20 MG/2 ML INTRAVENOUS SOLUTION: 20 20 mg/2 mL | INTRAVENOUS | Qty: 2

## 2015-08-04 MED FILL — ENOXAPARIN 40 MG/0.4 ML SUBCUTANEOUS SYRINGE: 40 40 mg/0.4 mL | SUBCUTANEOUS | Qty: 0.4

## 2015-08-04 MED FILL — FLUCONAZOLE 200 MG/100 ML IN SOD. CHLORIDE (ISO) INTRAVENOUS PIGGYBACK: 200 200 mg/100 mL | INTRAVENOUS | Qty: 100

## 2015-08-04 MED FILL — CATHFLO ACTIVASE 2 MG INTRA-CATHETER SOLUTION: 2 2 mg | Qty: 2

## 2015-08-04 MED FILL — VANCOMYCIN 1,000 MG INTRAVENOUS INJECTION: 1000 1000 mg | INTRAVENOUS | Qty: 1000

## 2015-08-04 NOTE — Unmapped (Signed)
Patient resting in bed; continues on TPN and antiotic therapy; prn medication given per patient's request; effective; call light in reach; visual checks during the shift.

## 2015-08-04 NOTE — Unmapped (Signed)
Clinical Pharmacy Note - TPN Order    Linda Ponce is a 45 y.o. female that is being followed by the clinical pharmacy department for monitoring and adjustment of TPN.    Assessment/Plan:   1) No new labs today.  Routine TPN labs in AM  2) Continue current (continuous) TPN  3) Progress notes reviewed.  Pt tolerating & no changes overnight  4) Pharmacy will continue to monitor and make necessary adjustments.    Thank you.  Dolores Patty, PharmD 08/04/2015 8:23 AM

## 2015-08-04 NOTE — Unmapped (Addendum)
Hospitalist Progress Note    08/04/2015     2:11 PM    Linda Ponce   LOS: 27 days       HPI;    Principal Problem:    Fistula  Active Problems:    Abdominal abscess    Enterocutaneous fistula    Malnutrition      Subjective & Interval History;    Afebrile, hemodynamically stable,   Appears to be in no distress, awake, alert. States pain controlled on current regimen when given on time, does not want inc in fentanyl patch    Review of Systems:  As above otherwise negative         PMH: Unchanged from H&P.   PSH: Unchanged from H&P.   Family medical history: Unchanged from H&P.   Social history: Unchanged from H&P      Scheduled Meds:  ??? clopidogrel  75 mg Per NG tube Daily 0900   ??? enoxaparin  40 mg Subcutaneous Daily 0900   ??? famotidine (PF)  20 mg Intravenous BID   ??? fentaNYL  1 patch Transdermal Q72H   ??? fentaNYL  1 patch Transdermal Q72H   ??? fluconazole in NaCl (iso-osm)  200 mg Intravenous Q24H   ??? insulin regular  0-5 Units Subcutaneous Daily 0900   ??? piperacillin-tazobactam (ZOSYN) IV extended interval  3.375 g Intravenous Q8H   ??? vancomycin  1,000 mg Intravenous Q24H     Continuous Infusions:  ??? TPN ADULT ([facility]) 69.8 mL/hr at 08/03/15 1730   ??? TPN ADULT ([facility])       PRN Meds:ALPRAZolam, alteplase, dextrose, dextrose 50 % in water (D50W), heparin lock flush, HYDROmorphone, ipratropium-albuterol, LORazepam, ondansetron    Objective:    Vital signs in last 24 hours:  Temp:  [98.1 ??F (36.7 ??C)-98.4 ??F (36.9 ??C)] 98.4 ??F (36.9 ??C)  Heart Rate:  [115-120] 120  Resp:  [18-24] 20  BP: (96-110)/(61-66) 105/62 mmHg    Physical Examination:     GENERAL: The patient is awake, alert, oriented?? not in acute cardiorespiratory distress, awakens easily  HEENT:Atraumatic.Normocephalic. PERRLA, EOMI. No icterus, no pallor, no jugular venous distention. ??  NECK: Supple. Trachea is in midline. No Thyromegaly. No Lymphadenopathy.  CHEST: Symmetric. Clear to auscultation, bilateral equal air entry. No wheezing or  rhonchi.  CARDIOVASCULAR: S1, S2, Regular pulse, tachy rate. No murmur or gallops. No JVD.  ABDOMEN: Soft, nontender, nondistended, positive bowel sounds.No viscera palpable.  EXTREMITIES: There is no edema in the lower extremities.?? Pulses are positive in all extremities, no popliteal lymphadenopathy or lymphangitis .(+) dry gangrene R foot  ??    Intake/Output last 3 shifts:  I/O last 3 completed shifts:  In: 806 [I.V.:806]  Out: 225 [Drains:225]    Recent Labs;  @SLAB @  No results found for: INR, PROTIME    ABG;  No results found for: PHART, PCO2, PO2ART, HCO3ART, BEART, HBO2PER, O2SATART        Last  Culture and Gram Stain;  No results found for: AEROBOT, ANABOT, LABGRAM, ISO2, ISO3, ISO4, ISO5, ISO6, PNAFISH      Diet;    Diet Orders          TPN ADULT ([facility]) at 69.8 mL/hr starting at 04/23 1800    TPN ADULT ([facility]) at 69.8 mL/hr starting at 04/22 1800    Diet NPO effective now Except for: except ice chips, except sips of clears starting at 03/28 1610          Future Appointments:  No future  appointments.    Assessment/Plan:             ?? Abdominal abscess (07/04/2015)  ????????????????????????- continue abx vanc / zosyn?? and diflucan, abx thru 5/1   - wound care   - increased fentanyl patch to 125 mgc/hr total, does not want further increase      ????????????????????????  ?? 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   Pager# [phone]    08/04/2015

## 2015-08-04 NOTE — Unmapped (Signed)
Problem: Fall Prevention  Goal: Patient will remain free of falls  Assess and monitor vitals signs, neurological status including level of consciousness and orientation. Reassess fall risk per hospital policy.    Ensure arm band on, uncluttered walking paths in room, adequate room lighting, call light and overbed table within reach, bed in low position, wheels locked, side rails up per policy, and non-skid footwear provided.   Outcome: Progressing  Fall precautions maintained, call light within reach, patient utilizes call light to ask for assistance as needed.  Arm band is secure, uncluttered walking pathways and adequate room lighting is maintained.  Call light and bed side table are within reach.  Bed is in low position, wheels locked, side rails per policy.  Non skid footwear is provided. IV ATB continues as rx'd without any adverse side effects. IV site WNL flushes with blood return. Tolerating TPN without difficulty, CBG as rx'd, hung and verified with another RN as rx'd No acute distress noted, noted blood pressures on the lower side of normal today, patient had increased complaints of pain in legs today, MD in and wrote order for 4mg  IV Dilaudid once. Given at 520pm.

## 2015-08-05 LAB — HEPATIC FUNCTION PANEL
ALT: 16 U/L (ref 7–52)
AST: 17 U/L (ref 13–39)
Albumin: <1.5 g/dL — ABNORMAL LOW (ref 3.5–5.7)
Alkaline Phosphatase: 137 U/L — ABNORMAL HIGH (ref 36–125)
Bilirubin, Direct: 0.04 mg/dL (ref 0.00–0.40)
Bilirubin, Indirect: 0.16 mg/dL (ref 0.00–1.10)
Total Bilirubin: 0.2 mg/dL (ref 0.0–1.5)
Total Protein: 4.3 g/dL — ABNORMAL LOW (ref 6.4–8.9)

## 2015-08-05 LAB — CBC
Hematocrit: 24.5 % (ref 35.0–45.0)
Hemoglobin: 8.1 g/dL (ref 11.7–15.5)
MCH: 30.9 pg (ref 27.0–33.0)
MCHC: 33 g/dL (ref 32.0–36.0)
MCV: 93.6 fL (ref 80.0–100.0)
MPV: 6.7 fL (ref 7.5–11.5)
Platelets: 576 10*3/uL (ref 140–400)
RBC: 2.61 10*6/uL (ref 3.80–5.10)
RDW: 20.7 % (ref 11.0–15.0)
WBC: 6.6 10*3/uL (ref 3.8–10.8)

## 2015-08-05 LAB — RENAL FUNCTION PANEL W/EGFR
Albumin: <1.5 g/dL (ref 3.5–5.7)
Anion Gap: 7 mmol/L (ref 3–16)
BUN: 24 mg/dL (ref 7–25)
CO2: 26 mmol/L (ref 21–33)
Calcium: 7.7 mg/dL (ref 8.6–10.3)
Chloride: 109 mmol/L (ref 98–110)
Creatinine: 0.44 mg/dL (ref 0.60–1.30)
Glucose: 91 mg/dL (ref 70–100)
Osmolality, Calculated: 298 mOsm/kg (ref 278–305)
Phosphorus: 3.9 mg/dL (ref 2.1–4.7)
Potassium: 4 mmol/L (ref 3.5–5.3)
Sodium: 142 mmol/L (ref 133–146)
eGFR AA CKD-EPI: >90 See note.
eGFR NONAA CKD-EPI: >90 See note.

## 2015-08-05 LAB — POC GLU MONITORING DEVICE
POC Glucose Monitoring Device: 115 mg/dL (ref 70–100)
POC Glucose Monitoring Device: 111 mg/dL (ref 70–100)
POC Glucose Monitoring Device: 111 mg/dL (ref 70–100)

## 2015-08-05 LAB — MAGNESIUM: Magnesium: 1.7 mg/dL (ref 1.5–2.5)

## 2015-08-05 LAB — TRIGLYCERIDES: Triglycerides: 107 mg/dL (ref 10–149)

## 2015-08-05 LAB — VANCOMYCIN, TROUGH: Vancomycin Tr: 9.3 ug/mL (ref 10.0–20.0)

## 2015-08-05 MED ORDER — TPN ADULT (WCH/Drake)
4 | INTRAVENOUS | Status: AC
Start: 2015-08-05 — End: 2015-08-06
  Administered 2015-08-05: 22:00:00 via INTRAVENOUS

## 2015-08-05 MED ORDER — vancomycin (VANCOCIN) 500 mg in sodium chloride 0.9% 100 mL ADDaptor IVPB
500 | Freq: Two times a day (BID) | INTRAVENOUS | Status: AC
Start: 2015-08-05 — End: 2015-08-08
  Administered 2015-08-06 – 2015-08-08 (×6): 500 mg via INTRAVENOUS

## 2015-08-05 MED FILL — VANCOMYCIN 1,000 MG INTRAVENOUS INJECTION: 1000 1000 mg | INTRAVENOUS | Qty: 1000

## 2015-08-05 MED FILL — FENTANYL 100 MCG/HR TRANSDERMAL PATCH: 100 100 mcg/hr | TRANSDERMAL | Qty: 1

## 2015-08-05 MED FILL — TRAVASOL 10 % INTRAVENOUS SOLUTION: 10 10 % | INTRAVENOUS | Qty: 799

## 2015-08-05 MED FILL — HYDROMORPHONE 2 MG/ML INJECTION SYRINGE: 2 2 mg/mL | INTRAMUSCULAR | Qty: 2

## 2015-08-05 MED FILL — VANCOMYCIN 500 MG INTRAVENOUS SOLUTION: 500 500 mg | INTRAVENOUS | Qty: 500

## 2015-08-05 MED FILL — FAMOTIDINE (PF) 20 MG/2 ML INTRAVENOUS SOLUTION: 20 20 mg/2 mL | INTRAVENOUS | Qty: 2

## 2015-08-05 MED FILL — ENOXAPARIN 40 MG/0.4 ML SUBCUTANEOUS SYRINGE: 40 40 mg/0.4 mL | SUBCUTANEOUS | Qty: 0.4

## 2015-08-05 MED FILL — CLOPIDOGREL 75 MG TABLET: 75 75 mg | ORAL | Qty: 1

## 2015-08-05 MED FILL — PIPERACILLIN-TAZOBACTAM 3.375 GRAM INTRAVENOUS SOLUTION: 3.375 3.375 gram | INTRAVENOUS | Qty: 1

## 2015-08-05 MED FILL — FLUCONAZOLE 200 MG/100 ML IN SOD. CHLORIDE (ISO) INTRAVENOUS PIGGYBACK: 200 200 mg/100 mL | INTRAVENOUS | Qty: 100

## 2015-08-05 NOTE — Unmapped (Signed)
[name]  Nutrition Progress Note    Nutrition Diagnosis:   1. Altered GI function - Continues    New Nutrition Diagnosis:  No    Diet: NPO with ice chips and sips of clears  TPN: Total Volume 1675 AAcids 80g, Dextrose 293g, Lipid  35d per day to provide  kcal 1668     Weight: (!) 90 lb (40.824 kg) (08/03/15 1202)     Skin Condition:  Coccyx improving, mid abd fistula improving, right foot declining- Right toes are gangrenous  Edema: BLE nonpitting    Labs:  Lab Results   Component Value Date    ALT 16 08/05/2015    AST 17 08/05/2015    ALKPHOS 137* 08/05/2015    BILITOT 0.2 08/05/2015     Lab Results   Component Value Date    TRIG 107 08/05/2015     Lab Results   Component Value Date    CREATININE 0.44* 08/05/2015    BUN 24 08/05/2015    NA 142 08/05/2015    K 4.0 08/05/2015    CL 109 08/05/2015    CO2 26 08/05/2015     Lab Results   Component Value Date    OSMOLALITY 298 08/05/2015     Lab Results   Component Value Date    CALCIUM 7.7* 08/05/2015    PHOS 3.9 08/05/2015    GLUCOSE 91 08/05/2015     Lab Results   Component Value Date    HGBA1C 4.7* 08/01/2015     Lab Results   Component Value Date    WBC 6.6 08/05/2015    HGB 8.1* 08/05/2015    HCT 24.5* 08/05/2015    MCV 93.6 08/05/2015    PLT 576* 08/05/2015         Component Value Date/Time    POCGMD 111* 08/05/2015 1132    POCGMD 111* 08/05/2015 0609    POCGMD 115* 08/04/2015 2332    POCGMD 107* 08/04/2015 0548    POCGMD 131* 08/04/2015 0051     No results found for: VITD25H      Scheduled Meds:  ??? clopidogrel  75 mg Per NG tube Daily 0900   ??? enoxaparin  40 mg Subcutaneous Daily 0900   ??? famotidine (PF)  20 mg Intravenous BID   ??? fentaNYL  1 patch Transdermal Q72H   ??? fentaNYL  1 patch Transdermal Q72H   ??? fluconazole in NaCl (iso-osm)  200 mg Intravenous Q24H   ??? insulin regular  0-5 Units Subcutaneous Daily 0900   ??? piperacillin-tazobactam (ZOSYN) IV extended interval  3.375 g Intravenous Q8H   ??? vancomycin  1,000 mg Intravenous Q24H     Continuous  Infusions:  ??? TPN ADULT ([facility]) 69.8 mL/hr at 08/04/15 1732   ??? TPN ADULT ([facility])       PRN Meds:.ALPRAZolam, alteplase, dextrose, dextrose 50 % in water (D50W), heparin lock flush, HYDROmorphone, ipratropium-albuterol, LORazepam, ondansetron    Note: Weight increased on 4/22 by ~5 lb.  TPN increased 4/17 and on 08/02/15.  Adequate hydration and BS control indicated.  Alk Phos elevated mildly, AST/ALT WNL.  Calcium adjusted per Pharmacy. Anemia likely non nutritional.     Nutrition Prescription:   Food and Nutrition Therapy: Rec continue current continuous TPN-no changes         Monitoring: weights, hydration,  Labs, skin condition    Plan of Care:Update/revised    Follow Up: Other:7-10 days    Randa Ngo, MEd. RDN, LD  Pager [phone]  Phone [phone]    08/05/2015 2:53 PM

## 2015-08-05 NOTE — Unmapped (Signed)
Hospitalist Progress Note    08/05/2015     9:36 AM    Linda Ponce   LOS: 28 days       HPI;    Patient is s/p OR on 05/31/2015 with [facility] for incision and drainage of complex abdominal wall abscess, rectal EUA, cystourethroscopy, bilateral retrograde pyelograms and removal old stent and placement of left 6x24 firm stent and right ureter embolization, right nephrostomy tube exchange, left nephrostomy tube placement and pelvic drain exchange on 06/06/2015 at a hospital in [place]. She was admitted to [facility] with severe sepsis secondary to peritonitis with stool coming from multiple abdominal wounds. She was seen by Critical Care, general surgery, and ID. General surgery recommended NPO and bowel rest and TPN has been started. She is receiving continued dressing changes and nephrostomy tube care. In addition, she was noted to have gangrene of the right foot and Dr. Donzetta Sprung performed angiogram, thrombectomy, and EIA stent on 06/27/15. Dr. Simeon Craft has evaluated and will follow as outpatient to consider transmetatarsal amputation at a later date. Patient is hemodynamically stable. She was  to [facility] and will need continued wound care, IV Vanc and Zosyn, and TPN.  She was sent back to [facility] on 07/05/2015  with abdominal pain and leukocytosis.CT scan showed multiple abdominal abscesses with large pelvic abscess . Her Hgb was 6.8 in ED and she was transfused 1 unit of blood.     Subjective & Interval History;    Pt examined at the bedside.  Looking withdrawn. States that pain is under reasonable control.  Right toes are gangrenous and very tender to touch.  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  No fever or chills.  10 point ROS was negative except as above.         Review of Systems:   as above    PMH: Unchanged from H&P.   PSH: Unchanged from H&P.   Family medical history: Unchanged from H&P.   Social history: Unchanged from H&P      Scheduled Meds:  ??? clopidogrel  75 mg Per NG tube Daily 0900   ???  enoxaparin  40 mg Subcutaneous Daily 0900   ??? famotidine (PF)  20 mg Intravenous BID   ??? fentaNYL  1 patch Transdermal Q72H   ??? fentaNYL  1 patch Transdermal Q72H   ??? fluconazole in NaCl (iso-osm)  200 mg Intravenous Q24H   ??? insulin regular  0-5 Units Subcutaneous Daily 0900   ??? piperacillin-tazobactam (ZOSYN) IV extended interval  3.375 g Intravenous Q8H   ??? vancomycin  1,000 mg Intravenous Q24H     Continuous Infusions:  ??? TPN ADULT ([facility]) 69.8 mL/hr at 08/04/15 1732   ??? TPN ADULT ([facility])       PRN Meds:ALPRAZolam, alteplase, dextrose, dextrose 50 % in water (D50W), heparin lock flush, HYDROmorphone, ipratropium-albuterol, LORazepam, ondansetron    Objective:    Vital signs in last 24 hours:  Temp:  [97.4 ??F (36.3 ??C)-98.3 ??F (36.8 ??C)] 97.4 ??F (36.3 ??C)  Heart Rate:  [112-127] 112  Resp:  [16-20] 19  BP: (102-104)/(57-67) 102/60 mmHg    Physical Examination:    GENERAL: The patient is awake,not in acute cardiorespiratory distress. Looking chronically sick.  HEENT:Atraumatic.Normocephalic. PERRLA, EOMI. No icterus, no pallor, no jugular venous distention..No ear or nose discharge.  NECK: Supple. Trachea is in midline. No Thyromegaly  CHEST: Symmetric. Clear to auscultation, bilateral equal air entry. No wheezing or rhonchi.  CARDIOVASCULAR: S1, S2, Regular pulse. No  murmur or gallops. No JVD.  ABDOMEN: Whole anterior abdomen is covered with pouch. Brownish fistula output noted.Bilateral nephrostomy tubes.  EXTREMITIES: There is + BLE edema . Very tender to touch both legs.  Right toes are gangrenous.   Skin;No bruise,No rash.No petechiae.  CNS; CN II-XII grossly intact. No focal neurological deficit.       Intake/Output last 3 shifts:  I/O last 3 completed shifts:  In: 2871 [I.V.:806; IV Piggyback:202]  Out: 1700 [Urine:700; Drains:1000]    Recent Labs;                           Lab name 07/31/15  0519 08/01/15  0621 08/05/15  0351   WBC 7.0 8.3 6.6   HEMOGLOBIN 9.0* 8.9* 8.1*   HEMATOCRIT 27.6* 27.0*  24.5*   MEAN CORPUSCULAR VOLUME 92.3 91.9 93.6   PLATELETS 562* 568* 576*                                                            Lab name 07/29/15  0409 08/01/15  0621 08/05/15  0351   SODIUM 142   142 140   140 142   POTASSIUM 4.1   4.1 4.3   4.3 4.0   CREATININE 0.43*   0.43* 0.42*   0.42* 0.44*   BUN 30*   30* 28*   28* 24   CHLORIDE 107   107 108   108 109   CO2 26   26 26   26 26    PHOSPHORUS 3.5   3.5 3.6   3.6 3.9   MAGNESIUM 1.9 1.8 1.7                         Lab name 07/22/15  0604 07/26/15  0746 07/29/15  0409 08/05/15  0351   AST 22 22 22 17    ALT 29 21 22 16    BILIRUBIN TOTAL 0.3 0.3 0.3 0.2       Lab name 07/05/15  0702  07/09/15  0544  07/29/15  0409 08/01/15  0621 08/05/15  0351   PREALBUMIN 14.0*  --  16.0*  --   --   --   --    ALBUMIN  --   < > 1.5*  < > <1.5*   <1.5* <1.5* <1.5*   <1.5*   < > = values in this interval not displayed.                                                                                                   Lab name 08/01/15  0621 08/05/15  0351   CALCIUM 7.5*   7.5* 7.7*   PHOSPHORUS 3.6   3.6 3.9  Lab name 07/29/15  0409 08/05/15  0351   TRIGLYCERIDES 114 107                        Lab name 07/27/15  0900 07/29/15  0830 08/01/15  0830   VANCOMYCIN TROUGH 14.9 10.8 10.6       Diet;    Diet Orders          TPN ADULT ([facility]) at 69.8 mL/hr starting at 04/24 1800    TPN ADULT ([facility]) at 69.8 mL/hr starting at 04/23 1800    Diet NPO effective now Except for: except ice chips, except sips of clears starting at 03/28 1610          Future Appointments:  No future appointments.    Assessment/Plan:      1. Peritoneal abscess- Mixed flora, pigtail drain 06/26/15 Refused followup CT, still requires one. Vanco/Zosyn/Diflucan.           Will complete 6 weeks IV therapy - projected stop 5/1 as per ID notes.    2. Enterocutaneous Fistula- wound care, bowel rest. Still has significant output.    3. Severe intractable acute on chronic pain syndrome-  receiving Dilaudid IV every 3 hr  prn, fentanyl patch 125 mcg.      4. Thrombocytosis. uncertain etiology- follow closely. Likely reactive.    5. Cachexia-   continue TPN, recheck PAB. Albumin is trending down.    6. Sinus tachycardia. Likely related to pain. Monitor.    7. Ischemic feet/arterial thromboemboli- The patient had a clot in her femoral artery right leg that was cleared by vascular surgery.  However the patient ended up with necrosis at her distal digits which may be a combination of lack of blood flow and the use of pressors. Refusing heparin, aspirin and Plavix. Refused all heparin injections previously as well. Canceled aspirin but continue to encourage Plavix..     8. History of colon cancer s/p exenteration with bilateral nephrostomy tubes    9. Recurrent presacral abscesses. Currently on broad spectrum IV Abx. ID following.    10. Debility- PT/OT    11. Guarded prognosis.     12. On TPN. 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 Cant tolerate PO. Weekly LFT while on TPN. TPN being managed by pharmacy. Electrolytes replacement through TPN. LFT are WNL so far.      Prophylaxis;  GI Prophylaxis: Pepcid  DVT Prophylaxis: Lovenox     Code Status: Full Code      Jaelynn Currier MD.       08/05/2015

## 2015-08-05 NOTE — Unmapped (Signed)
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   Adequate intake via PO and TPN to support healing of areas on skin as diagnosis allows.   Outcome: Progressing  Weight up to 90 lb  TPN increased  4/21  Adequate hydration indicated  Blood sugars in adequate control   LFT WNL x Alk phos mildly elevated.  Coccyx improved, foot declined.

## 2015-08-05 NOTE — Unmapped (Signed)
Assessment completed call light within reach continues atb pain maagement

## 2015-08-05 NOTE — Unmapped (Signed)
Patient remains on TPN and antibiotic therapy; alteplase was administered in the blue port of central line @ 2325 due to no blood return; effective with positive results; prn medication given per patient's request; monitoring vitals due to low blood pressure at times; explained rationale to patient and side effects of narcotic use; patient needs reinforce education about safe administered of narcotic to decrease risk of injury; patient not receptive to information being given; patient slightly agitated; anxious and tearful this shift; call light in reach; visual checks during the shift.

## 2015-08-05 NOTE — Unmapped (Signed)
Problem: Knowledge Deficit  Goal: Patient/family/caregiver demonstrates understanding of disease process, treatment plan, medications, and discharge instructions  Complete learning assessment and assess knowledge base.   Educate to importance meds

## 2015-08-05 NOTE — Unmapped (Signed)
Clinical Pharmacy Note - TPN Order    Linda Ponce is a 45 y.o. female that is being followed by the clinical pharmacy department for monitoring and adjustment of TPN.    Patient continues NPO with ice chips, sips of clear liquid.    Plan:    -adjusted Calcium estimated as 9.7 (albumin < 1.5)  -triglycerides 107, WNL  -Magnesium 1.7, WNL  -continue current continuous TPN-no changes   -per progress notes, patient tolerating TPN well        SODIUM   Date Value Ref Range Status   08/05/2015 142 133 - 146 mmol/L Final     CHLORIDE   Date Value Ref Range Status   08/05/2015 109 98 - 110 mmol/L Final     CO2   Date Value Ref Range Status   08/05/2015 26 21 - 33 mmol/L Final     POTASSIUM   Date Value Ref Range Status   08/05/2015 4.0 3.5 - 5.3 mmol/L Final     PHOSPHORUS   Date Value Ref Range Status   08/05/2015 3.9 2.1 - 4.7 mg/dL Final     CALCIUM   Date Value Ref Range Status   08/05/2015 7.7* 8.6 - 10.3 mg/dL Final     GLUCOSE   Date Value Ref Range Status   08/05/2015 91 70 - 100 mg/dL Final       Linda Ponce E Cuthbert Turton RPH 08/05/2015 8:00 AM

## 2015-08-05 NOTE — Unmapped (Signed)
Problem: Potential for Compromised Skin Integrity  Goal: Skin integrity is maintained or improved  Assess and monitor skin integrity. Identify patients at risk for skin breakdown on admission and per policy. Collaborate with interdisciplinary team and initiate plans and interventions as needed.   Outcome: Progressing  Dressing remains clean dry and intact; drains remain patent and draining. Skin remains remains clean and dry; patient refuses to reposition.

## 2015-08-05 NOTE — Unmapped (Signed)
Clinical Pharmacy Note    Linda Ponce is a 45 y.o. female that is being followed by the clinical pharmacy department for monitoring and adjustment of vancomycin.     Indication for treatment: peritoneal abscess  Goal trough: 10-15 mg/L  Anticipated stop date: TBD  Other antimicrobials: Piperacillin-tazobactam 3.375 g IV q8h extended infusion  ?????????????????????????????????????????????????????????????????????????? Fluconazole 200 mg IV q24h    Plan:    -change Vancomycin to 500 mg IV q12h as trough lower than desired  -renal clearance stable, patient afebrile, patient weight on downward trend  -next scheduled trough 4/26 at 0830    CREATININE   Date Value Ref Range Status   08/05/2015 0.44* 0.60 - 1.30 mg/dL Final   52/84/1324 4.01* 0.60 - 1.30 mg/dL Final   02/72/5366 4.40* 0.60 - 1.30 mg/dL Final   34/74/2595 6.38* 0.60 - 1.30 mg/dL Final   75/64/3329 5.18* 0.60 - 1.30 mg/dL Final   84/16/6063 0.16* 0.60 - 1.30 mg/dL Final     WBC   Date Value Ref Range Status   08/05/2015 6.6 3.8 - 10.8 10E3/uL Final   08/01/2015 8.3 3.8 - 10.8 10E3/uL Final   07/31/2015 7.0 3.8 - 10.8 10E3/uL Final   07/29/2015 9.1 3.8 - 10.8 10E3/uL Final        VANCOMYCIN TR   Date Value Ref Range Status   08/05/2015 9.3* 10.0 - 20.0 ug/mL Final          Linda Ponce Linda Ponce [facility] 08/05/2015 3:51 PM

## 2015-08-06 ENCOUNTER — Inpatient Hospital Stay: Admit: 2015-08-06 | Payer: MEDICARE | Attending: Surgery | Primary: Internal Medicine

## 2015-08-06 LAB — POC GLU MONITORING DEVICE
POC Glucose Monitoring Device: 105 mg/dL (ref 70–100)
POC Glucose Monitoring Device: 94 mg/dL (ref 70–100)
POC Glucose Monitoring Device: 109 mg/dL (ref 70–100)

## 2015-08-06 MED ORDER — LIDOCAINE HCL 4 % EX SOLN
4 % | Freq: Once | CUTANEOUS | Status: DC
Start: 2015-08-06 — End: 2015-08-07

## 2015-08-06 MED ORDER — diclofenac sodium 1 % Gel 2 g
1 | Freq: Three times a day (TID) | TOPICAL | Status: AC | PRN
Start: 2015-08-06 — End: 2015-09-13

## 2015-08-06 MED ORDER — TPN ADULT (WCH/Drake)
100 | INTRAVENOUS | Status: AC
Start: 2015-08-06 — End: 2015-08-07
  Administered 2015-08-06: 21:00:00 via INTRAVENOUS

## 2015-08-06 MED FILL — TRAVASOL 10 % INTRAVENOUS SOLUTION: 10 10 % | INTRAVENOUS | Qty: 799

## 2015-08-06 MED FILL — PIPERACILLIN-TAZOBACTAM 3.375 GRAM INTRAVENOUS SOLUTION: 3.375 3.375 gram | INTRAVENOUS | Qty: 1

## 2015-08-06 MED FILL — HYDROMORPHONE 2 MG/ML INJECTION SYRINGE: 2 2 mg/mL | INTRAMUSCULAR | Qty: 2

## 2015-08-06 MED FILL — VOLTAREN 1 % TOPICAL GEL: 1 1 % | TOPICAL | Qty: 100

## 2015-08-06 MED FILL — FAMOTIDINE (PF) 20 MG/2 ML INTRAVENOUS SOLUTION: 20 20 mg/2 mL | INTRAVENOUS | Qty: 2

## 2015-08-06 MED FILL — CLOPIDOGREL 75 MG TABLET: 75 75 mg | ORAL | Qty: 1

## 2015-08-06 MED FILL — FLUCONAZOLE 200 MG/100 ML IN SOD. CHLORIDE (ISO) INTRAVENOUS PIGGYBACK: 200 200 mg/100 mL | INTRAVENOUS | Qty: 100

## 2015-08-06 MED FILL — VANCOMYCIN 500 MG INTRAVENOUS SOLUTION: 500 500 mg | INTRAVENOUS | Qty: 500

## 2015-08-06 MED FILL — FENTANYL 25 MCG/HR TRANSDERMAL PATCH: 25 25 mcg/hr | TRANSDERMAL | Qty: 1

## 2015-08-06 NOTE — Unmapped (Signed)
Physical Therapy   Reason Patient Not Seen         Name: Linda Ponce  DOB: Oct 16, 1970  Attending Physician: Valda Lamb, MD  Admission Diagnosis: jewish 07/05/15 dx: sepsis  Date: 08/06/2015    Reviewed Pertinent hospital course: Yes    Unable to see patient due to :Pain Attempted to tx at 13:35. Pt crying and c/o bladder spasm. Pt states she is not due for pain meds yet. Pt re-positioned to comfort in slight R side-lying with all bags draining. Plan to cont POC as tol on next visit and get OOB to w/c. Pt states she is now NWB B LEs per podiatrist.     Rulon Abide, PT, DPT [phone]

## 2015-08-06 NOTE — Unmapped (Signed)
Problem: Knowledge Deficit  Goal: Patient/family/caregiver demonstrates understanding of disease process, treatment plan, medications, and discharge instructions  Complete learning assessment and assess knowledge base.   Educate to pain management

## 2015-08-06 NOTE — Unmapped (Signed)
Problem: Pain  Goal: LTG - Patient will manage pain with the appropriate technique/Intervention  Outcome: Progressing

## 2015-08-06 NOTE — Unmapped (Addendum)
[place] [facility] Inpatient Medical Psychology Service  [place] Dept of Neurology & Rehabilitation Medicine  Division of Neuropsychology & Medical Psychology    Medical Psychology Initial Diagnostic Evaluation    Patient Name: Linda Ponce       Date of Birth:  1970/12/24       MRN:  [id number]   CSN:  [id number]    Admit Date:  07/08/2015   Attending:  Valda Lamb, MD       Location:  [facility] LTACH Rm # [id number]    Referring Provider:  Valda Lamb, MD  Date of Referral:  08/01/2015  Reason for Referral:  Depression, mood  Service:  Behavioral Health--Inpatient Clinical/Medical Psychology      Service Date:  08/06/2015    Time and Duration of Evaluation:  8:30p-11:30p = 175 min face to face time and 60 min chart review, scoring, diagnosis, interpretation, and interim summary writing plus 25 minutes final report completion =  260 min total time    CPT Code:  63875 Neurobehavioral Status Exam - 4 units, 260 minutes total time    Information Sources:  Patient, current and recent past medical records from [facility], [facility], and [facility], charge nurse, and pt's RN at time of evaluation.    Problem List:       Primary Problem:  Fistula    Current Problem List:      Patient Active Problem List   Diagnosis   ??? Abdominal abscess   ??? Enterocutaneous fistula   ??? Malnutrition   ??? Fistula   ??? Adjustment disorder with mixed anxiety and depressed mood   ??? Pain of multiple sites   ??? Cognitive dysfunction-recent onset.       Current Medications:     Scheduled Medications:    ??? clopidogrel  75 mg Per NG tube Daily 0900   ??? enoxaparin  40 mg Subcutaneous Daily 0900   ??? famotidine (PF)  20 mg Intravenous BID   ??? fentaNYL  1 patch Transdermal Q72H   ??? fentaNYL  1 patch Transdermal Q72H   ??? fluconazole in NaCl (iso-osm)  200 mg Intravenous Q24H   ??? insulin regular  0-5 Units Subcutaneous Daily 0900   ??? piperacillin-tazobactam (ZOSYN) IV extended interval  3.375 g  Intravenous Q8H   ??? vancomycin  500 mg Intravenous Q12H     PRN Medications:  ALPRAZolam, alteplase, dextrose, dextrose 50 % in water (D50W), diclofenac sodium, heparin lock flush, HYDROmorphone, ipratropium-albuterol, LORazepam, ondansetron    Current Vitals:    Filed Vitals:    08/06/15 0209 08/06/15 0601 08/06/15 1113 08/06/15 2023   BP: 103/60 105/59 96/64 109/63   Pulse: 128 99 124 122   Temp:  98 ??F (36.7 ??C) 98.3 ??F (36.8 ??C) 98.3 ??F (36.8 ??C)   TempSrc:  Oral Oral Oral   Resp: 16 19 22 18    Height:       Weight:       SpO2:  97% 99% 100%         Reason for Referral/Presenting Problem & Relevant Medical Information:      Linda Ponce is a 45 y.o. 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 Her operative course was complicated by a rectovaginal  fistula with recurrent pre-sacral abscesses and urological complications which required bilateral nephrostomy tubes  Linda Ponce reported having had multiple abdomonal surgeries for abscesses and fistulas over the ensuing 6 yrs per patient. Per her [facility] EMR, most recently on 05/31/2015, she was admitted to [facility] in [place] for incision and drainage of a complex abdominal wall abscess, rectal EUA, cystourethroscopy, bilateral retrograde pyelograms with replacement of a malfunctioning left uretal stent and right ureter embolization, right nephrostomy tube exchange, left nephrostomy tube placement and pelvic drain exchange on 06/06/2015.  She was discharged to home on 06/15/15, and was brought to [place] by her sister the next day to help care for her as she recovered. Her son stayed in [place] with her mother. She became more ill at her sister's home, stopped eating, and became weak and confused. She was admitted to [facility] on 06/25/15 and diagnosed with severe sepsis secondary to peritonitis with stool coming from multiple abdominal wounds complicated by respiratory failure, AKI, and  significant delirium/encephalopathy. She was made NPO with bowel rest and placed on TPN. She also developed gangrene of the right foot due to blood clot which was treated with an EIA stent on 06/27/15. Dr. Simeon Craft has evaluated and will follow as outpatient to consider transmetatarsal amputation at a later date. After treatment and stabilization, Linda Ponce was transferred to [facility] on 07/08/15, but was acuted out to the [facility] same day when a large amount of blood came out of her midline abdominal wound and she became hypotensive. Lab work demonstrated leukocytosis and CT scan showed multiple abdominal abscesses with large pelvic abscess. After treatment and stabilization, she was again discharge to [facility] on 07/08/2015 for COC including IV ABX, medical management, TPN/nutritional management, wound care, and comprehensive therapies.       Linda Ponce was referred to Medical Psychology for evaluation and treatment of depression and mood.  She was evaluated in the context of a neurobehavioral status exam given her recent significant delirium, and self-reported decline in memory and concentration. This type of evaluation was deemed necessary given that cognitive impairment can have significant adverse effects on mood, anxiety, and coping.  This evaluation of depression and mood in the context of probable cognitive impairment has been completed.     Mental Health and Substance Use History:     Prior mental health issues:  none    Substance use:   ?? Smoking:  denied   ?? Alcohol use:  Rare; 1 glass of wine per quarter (4x/yr).  ?? Illicit drug use:  denied      Current Cognitive/Neurobehavioral Status:      Observed Cognitive/Neurobehavioral Status:    ?? Appearance: African American female, appears stated age, good  eye contact.     ?? Level of Arousal:  alert, attentive, and responsive     ?? Orientation: fully oriented to person, place, time and situation.    ?? Behavior/Motor: cooperative, tearful, uncomfortable d/t pain      ?? Speech:  fluent non-pressured no tangentiality or circumlocutions    ?? Thought Process: goal directed, linear, coherent, relevant, no flight of ideas or looseness of association.     ?? Thought Content:   ?? denies suicidal and homicidal ideation or wish to die  ?? no delusions expressed  ?? No auditory or visual hallucinations; no evidence of attending or responding to internal stimuli.    ?? Fund of Knowledge: appears consistent with average intellect.    ??  Memory: grossly intact to recent and remote content discussed but reports problems with memory and concentration (also see objective screen below).     ?? Insight:   Normal limits    ?? Judgment:  Normal limits     Brief Objective Cognitive Assessment:    MMSE-2 Standard Version:     ?? MMSE-2 score of 21/20 (T=21) indicates moderate cognitive impairment involving orientation, short term recall, and working memory.     ?? Reasoning skills:  Clinical assessment of reasoning skills indicated normal limits reasoning skills except when her pain gets severe, at which Ponce she becomes fearful and suspicious about medications being withheld because she bother's the nurses too much.    Conclusion of Objective Cognitive Assessment:  The above results indicate mild impairment in cognitive/neurobehavioral functioning with deficits involving orientation, short term recall, and working Linda Ponce.      Current Emotional Status:    Observed:  ?? Mood: depressed anxious   ?? Affect:  restricted    Self-reported:    ?? Depression & irritability:  daily episodic moderate depressed mood and mild irritable mood due to pain and frustration with prolonged hospitalization.  ?? Anxiety: daily constant moderate anxiety  due to fear of pain, concern that nurses won't get  pain medication to her on time (which makes her reluctant to sleep) with episodic spikes to severe when nursing is delayed in bringing her pain and when pain level gets severe.  ?? Suicidality/wish to die: denies - has son to get well for.  ?? Sleep:  maintenance problems largely due to fear of sleeping past time for pain med and awaking to severe pain.  ?? Appetite: I feel hungry all the time (is on TPN).  ?? Perceived social support:  good   ?? Perception of recovery:  realistic optimistic      Pain:     ?? 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 Pt reports severe pain ranging from 8-10/10 even with fentanyl 125 mcg patch. Dilaudid IV q3h prn reduces it to 5/10 but does not last.    ?? 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 No history of narcotic abuse, but she has been on narcotics for abdominal and radiation pain from 2011 cancer surgery and complications for the past 6 years.   ?? Best regimen per pt post op at [facility] was oxycodone 15 mg q4h scheduled and dilaudid 1 mg q3h prn which she tapered down before discharge.  ?? She also took neurontin 300 mg TID when home prior to surgery for neuropathic pain (low back).    ?? Psychosocial losses (death of father 1 week before surgery, missing teen son who is in [place] w/her mother) reduce ability to cope with pain and hospitalization.       Psychosocial Background Information:     ?? Marital status and living arrangement:  Never married  ?? Number of children: 1 teenage son involved in pt's life but is in [place] with her mother. She doesn't call him so as no not worry him (recommended that she reconsider that once pain is better controlled).    ?? Education:  Completed 12th grade; no college  ?? Occupation: permanent disability due  to chronic abdominal wounds and fistulas.  ?? 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  ?? Spirituality:   ?? Is important to pt  ?? Type: Baptist  ?? May want chaplain services later once pain and emotions are better controlled.      Psychiatric Diagnoses:    ?? Cognitive dysfunction--recent onset.    ?? Likely residual of significant delirium at [facility], pain medications, distraction from pain itself, and possibly IV ABX. Will monotor. Primarily involves orientation to time, and somewhat to place, short term recall, and working memory. Expected to improve as pt's wounds and infections improve.  ?? MMSE-2 score of 21/20 (T=21) indicates moderate cognitive impairment involving orientation, short term recall, and working memory.   ?? Clinical assessment of reasoning skills indicated normal limits reasoning skills except when her pain gets severe, at which Ponce she becomes fearful and suspicious about medications being withheld because she bother's the nurses too much.    ?? Pain of Multiple Sites.    ?? 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 Pt reports severe pain ranging from 8-10/10 even with fentanyl 125 mcg patch. Dilaudid IV q3h prn reduces it to 5/10 but does not last.  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 No history of narcotic abuse, but she has been on narcotics for abdominal and radiation pain from 2011 cancer surgery and complications x 6 yrs. Best regimen per pt post op at [facility] was oxycodone 15 mg q4h scheduled and dilaudid 1 mg q3h prn which she tapered down before discharge.She also took neurontin 300 mg TID when home prior to surgery for neuropathic pain (low back).  Psychosocial losses (death  of father 1 week before surgery, missing teen son who is in [place] w/her mother) reduce ability to cope with pain and hospitalization.     ?? Adjustment disorder with mixed anxiety and depressed mood.  ?? Related to prolonged hospitalization and inadequately controlled pain in abdomen, back, and legs.  ?? She has had NO prior treatment for anxiety or depression or substance use disorders.       Planned Psychological Interventions:      ?? Health & Behavior interventions to improve coping with severe pain, prolonged hospitalization, missing her teenage son, and grief over the loss of her father 1 week prior to her last [place] surgery.  ?? Cognitive monitoring.    Recommendations to DR [facility] MD:    ?? There is a great likelihood that pt is a fentanyl non-responder given high pain levels in multiple sites from different causes (see above) despite a 125 mcg fentanyl patch, and good pain control after the [facility] abdominal surgery using oxycodone 15 mg q4h prn taken orally and dilaudid 1-2 mg q3h prn which she was able to wean off of before going home. She also was involved in a pain clinic prior to that surgery and took neurontin 300 mg TID for low back pain associated with multiple surgeries and radiation in addiiton to her scheduled percocet which worked well at that time.  ?? Please consider placing her on scheduled oxycodone and tapering off the fentanyl patch which may also help her low blood pressure problems. Pt was on oxycodone 15 mg q4h scheduled after her last [facility] surgery but dose may need modified to prevent withdrawal from fentanyl (your discretion). Please  keep the dilaudid for now--I will prep her for letting that go as she progressed.  She claimed that she got good relief from scheduled oxycodone despite her fistulas  ?? Please also consider adding the neurontin 300 mg TID which she took at home for low back pain which will also help the neuropathic component of her gangrenous foot pain  (numbness, tingling, stinging, burning on the bottom of her right foot) and back pain.      ?? Please consider a psychiatry consult to select the best antidepressant for depression, anxiety and acute on chronic pain given her multiple medical problems.     Recommendations to Staff and Therapists:    ?? Encourage Linda Ponce to engage in pleasant distracting activities as much as possible.       Thank you for allowing me to participate in the care of your patient.      Harlie Buening P. Minerva Ends, PhD  Licensed Insurance risk surveyor in Medical Psychology  [place] Department of Neurology & Rehabilitation Medicine  Division of Neuropsychology & Medical Psychology  Office Phone for non-urgent messages/requests:  [phone]  Urgent issues-Pager: [phone]    08/06/2015

## 2015-08-06 NOTE — Unmapped (Signed)
Pt alert and oriented,  Vital signs stable, complaining of generalized pain, 4mg  Dilaudid given as ordered q3hrs PRN for pain. Pt met with DR. Lauren psychologist for 3hrs. Scheduled Zosyn and Fentanyl on hold for the meeting.

## 2015-08-06 NOTE — Unmapped (Signed)
Clinical Pharmacy Note - TPN Order    Linda Ponce is a 45 y.o. female that is being followed by the clinical pharmacy department for monitoring and adjustment of TPN.    Patient continues NPO with ice chips, sips of clear liquid.    Plan:    -no new TPN labs to review  -BS now daily;  ranged 94-111 last 24 hrs  -continue current continuous TPN-no changes  -formulation listed below    TPN Electrolytes  ??Sodium Chloride: 80 mEq  ??Sodium Acetate: 0 mEq  ??Sodium Phosphate: 0 mmol  ??Magnesium Sulfate: 16 mEq  ??Calcium Gluconate: 9.6 mEq  ??Potassium Chloride: 20 mEq  ??Potassium Acetate: 0 mEq  ??Potassium Phosphate: 15 mmol???????????? TPN Macronutrients  ??Volume: 1675 mL  ??Rate: 69.8 mL/hr  ??Amino Acids: 47.7 g/L ??  ??Dextrose: 175 g/L ??  ??Lipids: 21 g/L ???????????? TPN Additives  ??MVI 10 mL  ??TE 1 mL  ??Ascorbic Acid 200mg  ????????????                                   Tiani Stanbery E Fate Galanti [facility] 08/06/2015 9:18 AM

## 2015-08-06 NOTE — Unmapped (Signed)
Assessment complrted call light within reach  Continues atb out to appt

## 2015-08-06 NOTE — Unmapped (Signed)
Pt alert and oriented, vital signs stable, complaining of constant  Generalized pain,  Dilaudid given as ordered. TPN running at 69.93ml/h, pt notified of morning appointment P/U at 0800. Call light within reach.

## 2015-08-06 NOTE — Unmapped (Signed)
Hospitalist Progress Note    08/06/2015     1:37 PM    Linda Ponce   LOS: 29 days       HPI;    Patient is s/p OR on 05/31/2015 with [facility] for incision and drainage of complex abdominal wall abscess, rectal EUA, cystourethroscopy, bilateral retrograde pyelograms and removal old stent and placement of left 6x24 firm stent and right ureter embolization, right nephrostomy tube exchange, left nephrostomy tube placement and pelvic drain exchange on 06/06/2015 at a hospital in [place]. She was admitted to [facility] with severe sepsis secondary to peritonitis with stool coming from multiple abdominal wounds. She was seen by Critical Care, general surgery, and ID. General surgery recommended NPO and bowel rest and TPN has been started. She is receiving continued dressing changes and nephrostomy tube care. In addition, she was noted to have gangrene of the right foot and Dr. Donzetta Sprung performed angiogram, thrombectomy, and EIA stent on 06/27/15. Dr. Simeon Craft has evaluated and will follow as outpatient to consider transmetatarsal amputation at a later date. Patient is hemodynamically stable. She was  to [facility] and will need continued wound care, IV Vanc and Zosyn, and TPN.  She was sent back to [facility] on 07/05/2015  with abdominal pain and leukocytosis.CT scan showed multiple abdominal abscesses with large pelvic abscess . Her Hgb was 6.8 in ED and she was transfused 1 unit of blood.     Subjective & Interval History;    Tearful. C/o pain in right foot and right ankle pain.  Had f/u with podiatry today . No new intervention . Hopefully right toes will survive as they have good circulation now.  Still high output from fistula.    No fever or chill. NO SOB.      Review of Systems:   as above    PMH: Unchanged from H&P.   PSH: Unchanged from H&P.   Family medical history: Unchanged from H&P.   Social history: Unchanged from H&P      Scheduled Meds:  ??? clopidogrel  75 mg Per NG tube Daily 0900   ???  enoxaparin  40 mg Subcutaneous Daily 0900   ??? famotidine (PF)  20 mg Intravenous BID   ??? fentaNYL  1 patch Transdermal Q72H   ??? fentaNYL  1 patch Transdermal Q72H   ??? fluconazole in NaCl (iso-osm)  200 mg Intravenous Q24H   ??? insulin regular  0-5 Units Subcutaneous Daily 0900   ??? piperacillin-tazobactam (ZOSYN) IV extended interval  3.375 g Intravenous Q8H   ??? vancomycin  500 mg Intravenous Q12H     Continuous Infusions:  ??? TPN ADULT ([facility]) 69.8 mL/hr at 08/05/15 1735   ??? TPN ADULT ([facility])       PRN Meds:ALPRAZolam, alteplase, dextrose, dextrose 50 % in water (D50W), heparin lock flush, HYDROmorphone, ipratropium-albuterol, LORazepam, ondansetron    Objective:    Vital signs in last 24 hours:  Temp:  [97.4 ??F (36.3 ??C)-98.3 ??F (36.8 ??C)] 98.3 ??F (36.8 ??C)  Heart Rate:  [99-128] 124  Resp:  [16-22] 22  BP: (96-105)/(59-65) 96/64 mmHg    Physical Examination:    GENERAL: The patient is awake,not in acute cardiorespiratory distress. Looking chronically sick.  HEENT:Atraumatic.Normocephalic. PERRLA, EOMI. No icterus, no pallor, no jugular venous distention..No ear or nose discharge.  NECK: Supple. Trachea is in midline. No Thyromegaly  CHEST: Symmetric. Clear to auscultation, bilateral equal air entry. No wheezing or rhonchi.  CARDIOVASCULAR: S1, S2, Regular pulse. No  murmur or gallops. No JVD.  ABDOMEN: Whole anterior abdomen is covered with pouch. Brownish fistula output noted.Bilateral nephrostomy tubes.  EXTREMITIES: There is + BLE edema . Very tender to touch both legs.  Right toes are gangrenous.   Skin;No bruise,No rash.No petechiae.  CNS; CN II-XII grossly intact. No focal neurological deficit.       Intake/Output last 3 shifts:  I/O last 3 completed shifts:  In: 3722 [I.V.:1088; IV Piggyback:104]  Out: 1575 [Urine:375; Drains:1200]    Recent Labs;                           Lab name 07/31/15  0519 08/01/15  0621 08/05/15  0351   WBC 7.0 8.3 6.6   HEMOGLOBIN 9.0* 8.9* 8.1*   HEMATOCRIT 27.6* 27.0* 24.5*    MEAN CORPUSCULAR VOLUME 92.3 91.9 93.6   PLATELETS 562* 568* 576*                                                            Lab name 07/29/15  0409 08/01/15  0621 08/05/15  0351   SODIUM 142   142 140   140 142   POTASSIUM 4.1   4.1 4.3   4.3 4.0   CREATININE 0.43*   0.43* 0.42*   0.42* 0.44*   BUN 30*   30* 28*   28* 24   CHLORIDE 107   107 108   108 109   CO2 26   26 26   26 26    PHOSPHORUS 3.5   3.5 3.6   3.6 3.9   MAGNESIUM 1.9 1.8 1.7                         Lab name 07/22/15  0604 07/26/15  0746 07/29/15  0409 08/05/15  0351   AST 22 22 22 17    ALT 29 21 22 16    BILIRUBIN TOTAL 0.3 0.3 0.3 0.2       Lab name 07/05/15  0702  07/09/15  0544  07/29/15  0409 08/01/15  0621 08/05/15  0351   PREALBUMIN 14.0*  --  16.0*  --   --   --   --    ALBUMIN  --   < > 1.5*  < > <1.5*   <1.5* <1.5* <1.5*   <1.5*   < > = values in this interval not displayed.                                                                                                   Lab name 08/01/15  0621 08/05/15  0351   CALCIUM 7.5*   7.5* 7.7*   PHOSPHORUS 3.6   3.6 3.9  Lab name 07/29/15  0409 08/05/15  0351   TRIGLYCERIDES 114 107                        Lab name 07/29/15  0830 08/01/15  0830 08/05/15  0900   VANCOMYCIN TROUGH 10.8 10.6 9.3*       Diet;    Diet Orders          TPN ADULT ([facility]) at 69.8 mL/hr starting at 04/25 1800    TPN ADULT ([facility]) at 69.8 mL/hr starting at 04/24 1800    Diet NPO effective now Except for: except ice chips, except sips of clears starting at 03/28 1610          Future Appointments:  No future appointments.    Assessment/Plan:      1. Peritoneal abscess- Mixed flora, pigtail drain 06/26/15 Refused followup CT, still requires one. Vanco/Zosyn/Diflucan.           Will complete 6 weeks IV therapy - projected stop 5/1 as per ID notes.    2. Enterocutaneous Fistula- wound care, bowel rest. Still has significant output.    3. Severe intractable acute on chronic pain syndrome- receiving  Dilaudid IV every 3 hr  prn, fentanyl patch 125 mcg.      4. Thrombocytosis. uncertain etiology- follow closely. Likely reactive.    5. Cachexia-   continue TPN, recheck PAB. Albumin is trending down.    6. Sinus tachycardia. Likely related to pain. Monitor.    7. Ischemic feet/arterial thromboemboli- The patient had a clot in her femoral artery right leg that was cleared by vascular surgery.  However the patient ended up with necrosis at her distal digits which may be a combination of lack of blood flow and the use of pressors. Refusing heparin, aspirin and Plavix. Refused all heparin injections previously as well. Canceled aspirin but continue to encourage Plavix..           Following podiatrist at [facility].    8. History of colon cancer s/p exenteration with bilateral nephrostomy tubes    9. Recurrent presacral abscesses. Currently on broad spectrum IV Abx. ID following.    10. Debility- PT/OT    11. Guarded prognosis.     12. On TPN. 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 Cant tolerate PO. Weekly LFT while on TPN. TPN being managed by pharmacy. Electrolytes replacement through TPN. LFT are WNL so far.    A/p reviewed and updated.  Ordered Diclofenac gel on both knees PRN.    Prophylaxis;  GI Prophylaxis: Pepcid  DVT Prophylaxis: Lovenox     Code Status: Full Code      Brinlee Gambrell MD.       08/06/2015

## 2015-08-06 NOTE — Plan of Care (Signed)
Spoke to Dr Sherlene Shams regarding weight bearing status. Pt can be full weight bearing. Called [facility] and orders given

## 2015-08-06 NOTE — Plan of Care (Signed)
NAME:  Building control surveyor  DATE OF BIRTH:  [birth year]  MEDICAL RECORD NUMBER:  [id number]  DATE:  08/06/2015    Dressing applied per orders.  Discharge instructions given.  Patient verbalized understanding.  Return to [facility] in 1 week.  Pt to be non weight bearing at this time, encouraged to work with Therapy.      ??? Atypical Ulcer       Nursing Diagnosis/Problem    '[x]'  Presence of Atypical Ulcer    '[x]'  Pain related to Atypical Ulcer    '[x]'  Knowledge deficit related to the diagnosis & management of Atypical ulcer    Goals    '[]'  Non invasive Arterial/Venous studies ordered       '[]'   Goal met          '[]'  Goal not met     '[x]'  Control of pain,exudate      '[x]'   Goal met          '[]'  Goal not met    '[x]'  Verbalize understanding of disease process      '[x]'   Goal met          '[]'  Goal not met     '[]'  Improved Oxygenation      '[]'   Goal met          '[]'  Goal not met      Nursing Interventions/Evaluation    '[x]'  At every visit assess Pain, exudate    '[x]'  Provide education on Atypical Ulcer    '[x]'  Assessment of ulcer each visit      Diagnostic/Treatment activity    '[]'  non-invasive arterial/Venous studies    '[]'  Bx/Pathology   '[]'  Cx  '[]'  Consult       '[]'  Debridement    I'[]'  Scheduled Angiogram       Electronically signed by Thea Silversmith, RN on 08/06/2015 at 10:14 AM

## 2015-08-06 NOTE — Progress Notes (Signed)
[facility]  Progress Note       Erica Harding  AGE: 45 y.o.   GENDER: female  DOB: [birth year]  TODAY'S DATE:  08/06/2015    Subjective:     No chief complaint on file.        HISTORY of PRESENT ILLNESS HPI     Erica Harding is a 45 y.o. female who presents today for wound evaluation.   History of Wound: Open abdominal wounds and right foot wound    Wound Pain:  moderate  Severity:  6 / 10   Wound Type:  arterial and non-healing surgical  Modifying Factors:  malnutrition  Associated Signs/Symptoms:  none        PAST MEDICAL HISTORY        Diagnosis Date   ??? Colorectal cancer ([place])        PAST SURGICAL HISTORY    Past Surgical History:   Procedure Laterality Date   ??? ABDOMEN SURGERY     ??? COLONOSCOPY     ??? HYSTERECTOMY         FAMILY HISTORY    No family history on file.    SOCIAL HISTORY    Social History   Substance Use Topics   ??? Smoking status: Never Smoker   ??? Smokeless tobacco: Not on file   ??? Alcohol use No       ALLERGIES    Allergies   Allergen Reactions   ??? Latex Itching   ??? Compazine [Prochlorperazine Maleate] Swelling       MEDICATIONS    Current Outpatient Prescriptions on File Prior to Encounter   Medication Sig Dispense Refill   ??? vancomycin (VANCOCIN) 500 MG/100ML IVPB Infuse 150 mLs intravenously every 24 hours 2500 mL 0   ??? HYDROmorphone (DILAUDID) 1 MG/ML injection Infuse 1 mL intravenously every 2 hours as needed for Pain . (Patient taking differently: Infuse 1 mg intravenously every 3 hours  .) 1 mL 0   ??? acetaminophen (OFIRMEV) 10 MG/ML SOLN infusion Infuse 74.1 mLs intravenously every 6 hours as needed for Fever or Pain 4000 mL 0   ??? aspirin 81 MG chewable tablet 1 tablet by Per NG tube route daily 30 tablet 3   ??? LORazepam (ATIVAN) 2 MG/ML injection Infuse 0.5 mLs intravenously every 4 hours as needed (anxiety) 1 mL 0   ??? ipratropium-albuterol (DUONEB) 0.5-2.5 (3) MG/3ML SOLN nebulizer solution Inhale 3 mLs into the lungs every 4 hours as needed for Shortness of Breath 360 mL 0    ??? glucose (GLUTOSE) 40 % GEL Take 15 g by mouth as needed (glc<70) 45 g 1   ??? insulin lispro (HUMALOG) 100 UNIT/ML pen Inject 0-6 Units into the skin every 4 hours 5 Pen 3   ??? ondansetron (ZOFRAN) 4 MG/2ML injection Infuse 2 mLs intravenously every 6 hours as needed for Nausea 15 mL 0   ??? fluconazole (DIFLUCAN) 200MG /100ML IVPB Infuse 100 mLs intravenously every 24 hours 100 mL 0   ??? magnesium sulfate 10-5 MG/ML-% Infuse 100 mLs intravenously as needed (Per IV Magnesium Replacement Protocol) 100 mL 0   ??? potassium chloride 20 MEQ/50ML IVPB Infuse 50 mLs intravenously as needed (Per IV Potassium Replacement Protocol) 50 mL 0   ??? clopidogrel (PLAVIX) 75 MG tablet 1 tablet by Per NG tube route daily 30 tablet 3   ??? fat emulsion 20 % infusion Infuse 250 mLs intravenously twice a week 250 mL 0   ??? famotidine (PEPCID) 20 MG/2ML  injection Infuse 2 mLs intravenously daily 2 mL 0     No current facility-administered medications on file prior to encounter.        REVIEW OF SYSTEMS    Pertinent items are noted in HPI.      Objective:      BP 96/64   Pulse 122   Temp 98.7 ??F (37.1 ??C) (Oral)    Resp 18    PHYSICAL EXAM    Abdomen soft.  Midline wound is unable to be fully evaluated as patient has complex pouching.  There is no surrounding evidence of infection.  There is bilious drainage in both bags.  She has.dry gangrene of the first through fifth toes on the right foot.  No evidence of infection      Assessment:     Patient Active Problem List   Diagnosis   ??? AKI (acute kidney injury) ([place])   ??? Hyperkalemia   ??? Open wnd ant abdomen-comp   ??? Peritonitis and retroperitoneal infections ([place])   ??? Fistula   ??? Ischemic foot   ??? Cachexia ([place])   ??? Postprocedural intra-abdominal sepsis ([place])   ??? Abscess   ??? Dehydration   ??? Encephalopathies   ??? 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 Enterocutaneous fistula   ??? Abnormal CT of the chest   ??? Infiltrate noted on imaging study       Wound 06/25/15 Other (Comment) Abdomen Anterior;Mid midline  abdominal surgical incision (Active)   Peri-wound Assessment Pink 08/06/2015  9:30 AM   Peri-Wound Texture Localized edema 07/05/2015  9:00 PM   Peri-Wound Moisture Weeping 07/07/2015  8:19 AM   Peri-Wound Color Pink;Other 07/08/2015  2:00 PM   Assessment Fragile 08/06/2015  9:30 AM   Wound Length (cm) 7.5 cm 08/06/2015  9:30 AM   Wound Width (cm) 4.5 cm 08/06/2015  9:30 AM   Depth (cm)  1 07/08/2015  2:00 PM   Calculated Wound Size (cm^2) (l*w) 33.75 cm^2 08/06/2015  9:30 AM   Change in Wound Size % (l*w) 29.69 08/06/2015  9:30 AM   Closure Other (Comment) 07/08/2015 12:00 PM   Culture Taken No 07/08/2015  2:00 PM   Dressing Status Changed;Other (Comment) 07/08/2015  2:00 PM   Dressing Changed Dressing reinforced 07/08/2015 12:00 PM   Dressing/Treatment Dry dressing;Other (Comment) 07/08/2015 12:00 PM   Wound Cleansed Rinsed/Irrigated with saline 07/08/2015  2:00 PM   Drainage Amount Large 07/08/2015  2:00 PM   Drainage Description Brown;Green 07/08/2015  2:00 PM   Odor None 07/08/2015  2:00 PM   Margins Unattached edges 07/08/2015  2:00 PM   Number of days:41       Wound 06/25/15 Other (Comment) Abdomen Left;Lateral open incision to left lateral abdominal wall (Active)   Peri-wound Assessment Fragile 08/06/2015  9:30 AM   Peri-Wound Texture Localized edema 07/05/2015  9:00 PM   Peri-Wound Moisture Other(Comment) 07/08/2015  2:00 PM   Peri-Wound Color Pink 07/07/2015  8:19 AM   Assessment Fragile;Pink 08/06/2015  9:30 AM   Wound Length (cm) 3 cm 08/06/2015  9:30 AM   Wound Width (cm) 1.5 cm 08/06/2015  9:30 AM   Depth (cm)  uta drainage bag 08/06/2015  9:30 AM   Calculated Wound Size (cm^2) (l*w) 4.5 cm^2 08/06/2015  9:30 AM   Change in Wound Size % (l*w) 25 08/06/2015  9:30 AM   Closure None 07/08/2015 12:00 PM   Dressing Status Intact 07/08/2015  2:00 PM   Dressing Changed Dressing reinforced 07/08/2015  12:00 PM   Dressing/Treatment Other (Comment) 07/08/2015  2:00 PM   Drainage Amount Large 07/08/2015  2:00 PM   Drainage Description Brown;Green  07/08/2015  2:00 PM   Odor None 07/08/2015  2:00 PM   Margins Unattached edges 07/08/2015  2:00 PM   Number of days:41       Wound 07/05/15 Other (Comment) Foot Right all 5 toes on right foot (Active)   Peri-wound Assessment Black;Red 08/06/2015  9:30 AM   Peri-Wound Moisture Dry;Painful;Swelling 07/07/2015  8:19 AM   Peri-Wound Color Red 07/07/2015  8:19 AM   Assessment Black 08/06/2015  9:30 AM   Closure Open to air 07/08/2015 12:00 PM   Necrotic Amount Large: 67-100% 07/07/2015  8:19 AM   Drainage Amount None 07/08/2015 12:00 PM   Number of days:40     45 year old female with a complex past surgical history who is here for follow-up of complex abdominal wounds with fistulas.  She also has dry gangrene on the right first through fifth toes.  The abdominal wounds have decreased in size.  She is showing signs of healing the right foot wound as well.      Plan:     Continue TPN with bowel rest for abdominal fistulas.  Current pouching appears sufficient to protect the skin and control drainage.  We will plan for repeat CAT scan of the abdomen and pelvis within the next 2-4 weeks.  Betadine and dry dressing to the areas of dry gangrene of the right foot.  Follow-up in 1 week.    Discharge Treatment          Written Patient Discharge Instructions Given            Electronically signed by Elly Modena, MD on 08/06/2015 at 10:09 AM

## 2015-08-07 LAB — POC GLU MONITORING DEVICE
POC Glucose Monitoring Device: 102 mg/dL (ref 70–100)
POC Glucose Monitoring Device: 96 mg/dL (ref 70–100)

## 2015-08-07 LAB — VANCOMYCIN, TROUGH: Vancomycin Tr: 9.1 ug/mL (ref 10.0–20.0)

## 2015-08-07 MED ORDER — oxyCODONE (ROXICODONE) immediate release tablet 10 mg
5 | Freq: Every day | ORAL | Status: AC
Start: 2015-08-07 — End: 2015-08-16
  Administered 2015-08-07 – 2015-08-16 (×31): 10 mg via ORAL

## 2015-08-07 MED ORDER — TPN ADULT (WCH/Drake)
10 | INTRAVENOUS | Status: AC
Start: 2015-08-07 — End: 2015-08-08
  Administered 2015-08-07: 21:00:00 via INTRAVENOUS

## 2015-08-07 MED ORDER — gabapentin (NEURONTIN) 250 mg/5 mL solution 125 mg
50 | Freq: Three times a day (TID) | ORAL | Status: AC
Start: 2015-08-07 — End: 2015-08-29
  Administered 2015-08-08 – 2015-08-29 (×18): 125 mg via ORAL

## 2015-08-07 MED ORDER — oxyCODONE (ROXICODONE) 5 mg/5 mL solution 10 mg
5 | ORAL | Status: AC
Start: 2015-08-07 — End: 2015-08-07
  Administered 2015-08-07: 19:00:00 10 mg via ORAL

## 2015-08-07 MED FILL — ENOXAPARIN 40 MG/0.4 ML SUBCUTANEOUS SYRINGE: 40 40 mg/0.4 mL | SUBCUTANEOUS | Qty: 0.4

## 2015-08-07 MED FILL — GABAPENTIN 250 MG/5 ML ORAL SOLUTION: 50 50 mg/mL | ORAL | Qty: 5

## 2015-08-07 MED FILL — FLUCONAZOLE 200 MG/100 ML IN SOD. CHLORIDE (ISO) INTRAVENOUS PIGGYBACK: 200 200 mg/100 mL | INTRAVENOUS | Qty: 100

## 2015-08-07 MED FILL — TRAVASOL 10 % INTRAVENOUS SOLUTION: 10 10 % | INTRAVENOUS | Qty: 799

## 2015-08-07 MED FILL — VANCOMYCIN 500 MG INTRAVENOUS SOLUTION: 500 500 mg | INTRAVENOUS | Qty: 500

## 2015-08-07 MED FILL — HYDROMORPHONE 2 MG/ML INJECTION SYRINGE: 2 2 mg/mL | INTRAMUSCULAR | Qty: 2

## 2015-08-07 MED FILL — CLOPIDOGREL 75 MG TABLET: 75 75 mg | ORAL | Qty: 1

## 2015-08-07 MED FILL — PIPERACILLIN-TAZOBACTAM 3.375 GRAM INTRAVENOUS SOLUTION: 3.375 3.375 gram | INTRAVENOUS | Qty: 1

## 2015-08-07 MED FILL — OXYCODONE 5 MG TABLET: 5 5 MG | ORAL | Qty: 2

## 2015-08-07 MED FILL — FAMOTIDINE (PF) 20 MG/2 ML INTRAVENOUS SOLUTION: 20 20 mg/2 mL | INTRAVENOUS | Qty: 2

## 2015-08-07 MED FILL — ONDANSETRON HCL (PF) 4 MG/2 ML INJECTION SOLUTION: 4 4 mg/2 mL | INTRAMUSCULAR | Qty: 2

## 2015-08-07 MED FILL — OXYCODONE 5 MG/5 ML ORAL SOLUTION: 5 5 mg/5 mL | ORAL | Qty: 10

## 2015-08-07 NOTE — Unmapped (Signed)
Occupational Therapy  Occupational Therapy Progress Note    Name: Emonie Espericueta  DOB: [birth year]  Attending Physician: Valda Lamb, MD  Admission Diagnosis: jewish 07/05/15 dx: sepsis  Date: 08/07/2015    Pt upon arrival began shaking head and crying to therapist stating,  This isn't going to work today, I can't have pain medication yet. Pt tearful and stating other multiple reasons as to why she can not participate. Pt educated again on benefits of getting out bed and participating daily to increase strength and activity tolerance. RN present also encouraging pt and reminded pt she could have meds almost right after getting up; pt still refused. Pt also educated on possibility discharge if this continues as no progress has been made. Reminded pt OT's role and how she could benefit from services not just getting up. Pt again refused; will re-attempt next scheduled visit.  Cont per POC in colboration with OTR/L  [name], COTA/L

## 2015-08-07 NOTE — Unmapped (Signed)
Morse fall scale 85  Alert and oriented times 4  Calls out appropriately  Much time spent with patient attempting to get her to adhere and agree to care  Wound care RN did dressing change to buttocks, and fistulas earlier in day, remains NPO except sips liquids  TPN running at 69.8 ml hr  Patient gotten up in the chair with hoyer lift

## 2015-08-07 NOTE — Unmapped (Signed)
Hospitalist Progress Note    08/07/2015     2:05 PM    Linda Ponce   LOS: 30 days       HPI;    Patient is s/p OR on 05/31/2015 with [facility] for incision and drainage of complex abdominal wall abscess, rectal EUA, cystourethroscopy, bilateral retrograde pyelograms and removal old stent and placement of left 6x24 firm stent and right ureter embolization, right nephrostomy tube exchange, left nephrostomy tube placement and pelvic drain exchange on 06/06/2015 at a hospital in [place]. She was admitted to [facility] with severe sepsis secondary to peritonitis with stool coming from multiple abdominal wounds. She was seen by Critical Care, general surgery, and ID. General surgery recommended NPO and bowel rest and TPN has been started. She is receiving continued dressing changes and nephrostomy tube care. In addition, she was noted to have gangrene of the right foot and Dr. Donzetta Sprung performed angiogram, thrombectomy, and EIA stent on 06/27/15. Dr. Simeon Craft has evaluated and will follow as outpatient to consider transmetatarsal amputation at a later date. Patient is hemodynamically stable. She was  to [facility] and will need continued wound care, IV Vanc and Zosyn, and TPN.  She was sent back to [facility] on 07/05/2015  with abdominal pain and leukocytosis.CT scan showed multiple abdominal abscesses with large pelvic abscess . Her Hgb was 6.8 in ED and she was transfused 1 unit of blood.     Subjective & Interval History;    Feeling a little better today. Was in good mood. Pain is under better control today.  Wound dressing was changed and smaller dressing was placed on fistula.  Otherwise no new complain.    Denies any fever,chill, headache,N/V, CP ,SOB,Cough, Palpitation, diarrhea or constipation.  Denies any joint pain, skin rash.       Review of Systems:   as above    PMH: Unchanged from H&P.   PSH: Unchanged from H&P.   Family medical history: Unchanged from H&P.   Social history: Unchanged  from H&P      Scheduled Meds:  ??? clopidogrel  75 mg Per NG tube Daily 0900   ??? enoxaparin  40 mg Subcutaneous Daily 0900   ??? famotidine (PF)  20 mg Intravenous BID   ??? fentaNYL  1 patch Transdermal Q72H   ??? fluconazole in NaCl (iso-osm)  200 mg Intravenous Q24H   ??? gabapentin  125 mg Oral 3 times per day   ??? insulin regular  0-5 Units Subcutaneous Daily 0900   ??? oxyCODONE  10 mg Oral Q4H2 SCH   ??? piperacillin-tazobactam (ZOSYN) IV extended interval  3.375 g Intravenous Q8H   ??? vancomycin  500 mg Intravenous Q12H     Continuous Infusions:  ??? TPN ADULT ([facility]) 69.8 mL/hr at 08/06/15 1702   ??? TPN ADULT ([facility])       PRN Meds:ALPRAZolam, alteplase, dextrose, dextrose 50 % in water (D50W), diclofenac sodium, heparin lock flush, HYDROmorphone, ipratropium-albuterol, LORazepam, ondansetron    Objective:    Vital signs in last 24 hours:  Temp:  [97.4 ??F (36.3 ??C)-98.3 ??F (36.8 ??C)] 97.4 ??F (36.3 ??C)  Heart Rate:  [119-122] 119  Resp:  [18-20] 20  BP: (103-109)/(50-63) 103/50 mmHg    Physical Examination:    GENERAL: The patient is awake,not in acute cardiorespiratory distress. Looking chronically sick.  HEENT:Atraumatic.Normocephalic. PERRLA, EOMI. No icterus, no pallor, no jugular venous distention..No ear or nose discharge.  NECK: Supple. Trachea is in midline. No Thyromegaly  CHEST: Symmetric. Clear to auscultation, bilateral equal air entry. No wheezing or rhonchi.  CARDIOVASCULAR: S1, S2, Regular pulse. No murmur or gallops. No JVD.  ABDOMEN: Whole anterior abdomen is covered with pouch. Brownish fistula output noted.Bilateral nephrostomy tubes.  EXTREMITIES: There is + BLE edema . Very tender to touch both legs.  Right toes are gangrenous.   Skin;No bruise,No rash.No petechiae.  CNS; CN II-XII grossly intact. No focal neurological deficit.       Intake/Output last 3 shifts:  I/O last 3 completed shifts:  In: 4412 [I.V.:1550; IV Piggyback:270]  Out: 2625 [Urine:1325; Drains:1300]    Recent Labs;                            Lab name 07/31/15  0519 08/01/15  0621 08/05/15  0351   WBC 7.0 8.3 6.6   HEMOGLOBIN 9.0* 8.9* 8.1*   HEMATOCRIT 27.6* 27.0* 24.5*   MEAN CORPUSCULAR VOLUME 92.3 91.9 93.6   PLATELETS 562* 568* 576*                                                            Lab name 07/29/15  0409 08/01/15  0621 08/05/15  0351   SODIUM 142   142 140   140 142   POTASSIUM 4.1   4.1 4.3   4.3 4.0   CREATININE 0.43*   0.43* 0.42*   0.42* 0.44*   BUN 30*   30* 28*   28* 24   CHLORIDE 107   107 108   108 109   CO2 26   26 26   26 26    PHOSPHORUS 3.5   3.5 3.6   3.6 3.9   MAGNESIUM 1.9 1.8 1.7                         Lab name 07/22/15  0604 07/26/15  0746 07/29/15  0409 08/05/15  0351   AST 22 22 22 17    ALT 29 21 22 16    BILIRUBIN TOTAL 0.3 0.3 0.3 0.2       Lab name 07/05/15  0702  07/09/15  0544  07/29/15  0409 08/01/15  0621 08/05/15  0351   PREALBUMIN 14.0*  --  16.0*  --   --   --   --    ALBUMIN  --   < > 1.5*  < > <1.5*   <1.5* <1.5* <1.5*   <1.5*   < > = values in this interval not displayed.                                                                                                   Lab name 08/01/15  0621 08/05/15  0351   CALCIUM 7.5*   7.5* 7.7*   PHOSPHORUS 3.6   3.6 3.9  Lab name 07/29/15  0409 08/05/15  0351   TRIGLYCERIDES 114 107                        Lab name 08/01/15  0830 08/05/15  0900 08/07/15  0850   VANCOMYCIN TROUGH 10.6 9.3* 9.1*       Diet;    Diet Orders          TPN ADULT ([facility]) at 69.8 mL/hr starting at 04/26 1800    TPN ADULT ([facility]) at 69.8 mL/hr starting at 04/25 1800    Diet NPO effective now Except for: except ice chips, except sips of clears starting at 03/28 1610          Future Appointments:  No future appointments.    Assessment/Plan:      1. Peritoneal abscess- Mixed flora, pigtail drain 06/26/15 Refused followup CT, still requires one. Vanco/Zosyn/Diflucan.           Will complete 6 weeks IV therapy - projected stop 5/1 as per ID  notes.    2. Enterocutaneous Fistula- wound care, bowel rest. Still has significant output.    3. Severe intractable acute on chronic pain syndrome- receiving Dilaudid IV every 3 hr  Prn,Fentanyl patch decreased to 100 mcg. Started PO Oxycodone scheduled as she had better response with it previously. Added Gabapentin as she was on previously.         Consulted Psychiatry for depression, chronic pain.     4. Thrombocytosis. uncertain etiology- follow closely. Likely reactive.    5. Cachexia-   continue TPN, recheck PAB. Albumin is trending down.    6. Sinus tachycardia. Likely related to pain. Monitor.    7. Ischemic feet/arterial thromboemboli- The patient had a clot in her femoral artery right leg that was cleared by vascular surgery.  However the patient ended up with necrosis at her distal digits which may be a combination of lack of blood flow and the use of pressors. Refusing heparin, aspirin and Plavix. Refused all heparin injections previously as well. Canceled aspirin but continue to encourage Plavix..           Following podiatrist at [facility].    8. History of colon cancer s/p exenteration with bilateral nephrostomy tubes    9. Recurrent presacral abscesses. Currently on broad spectrum IV Abx. ID following.    10. Debility- PT/OT    11. Guarded prognosis.     12. On TPN. 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 Cant tolerate PO. Weekly LFT while on TPN. TPN being managed by pharmacy. Electrolytes replacement through TPN. LFT are WNL so far.    A/p reviewed and updated.  Ordered Diclofenac gel on both knees PRN. Ordered bilateral knee immobilizers.  Get CBC, BMP tomorrow.      Prophylaxis;  GI Prophylaxis: Pepcid  DVT Prophylaxis: Lovenox     Code Status: Full Code      Harveen Flesch MD.       08/07/2015

## 2015-08-07 NOTE — Unmapped (Signed)
Physical Therapy                                              Physical Therapy Treatment     Name: Linda Ponce  DOB: 1970-04-27  Attending Physician: Valda Lamb, MD  Admission Diagnosis: jewish 07/05/15 dx: sepsis  Date: 08/07/2015    Reviewed Pertinent hospital course: Yes  Hospital Course PT/OT: 23f with history of colorectal CA, initially admitted to [facility] post operatively for continued wound care. Developed bleeding, sent back to [facility] where she was foundto have multiple pelvic abscesses, enterocutaneous fistulas, was kept NPO, started on IV abx, TPN, and returns for continued care. PRECAUTIONS: FULL CODE, AAT, NO WEIGHT BEARING RESTRICTIONS, ISCHEMIC R TOES, MULTIPLE DRAINS FROM ABDOMEN AND BACK     Assessment     Assessment: Impaired Bed Mobility, Impaired Transfer Mobility, Impaired Ambulation, Impaired Balance, Deconditioning, Impaired Strength, Impaired ROM  Prognosis: Guarded (due to multiple medical issues )    Goals  Pt Will Go Supine To Sit: Minimal (to get in and out of bed within 4 weeks. )  Sit To Stand: Min A with LRAD to prepare for gait and transfers within 6 weeks.   Pt Will Transfer Bed/Chair: Minimal (with LRAD to increase independence in home within 6 weeks. )  Pt Will Ambulate: Minimal (25' with RW to access home within 6 weeks. )  Miscellaneous Goal #1: Pt will propel w/c 150' with B UEs with supervision to increase independence in home and community within 6 weeks.   Long Term Goal : Pt will be independent with HEP to improve B LE strength to facilitate safe mobility within 8 weeks.   Time frame for goals to be met in: 8 weeks, 09/14/15    Recommendation  Plan  Treatment/Interventions: LE strengthening/ROM, Endurance training, Patient/family training, Equipment eval/education, Museum/gallery curator, Therapeutic Exercise, Therapeutic Activity, Continued evaluation, Wheelchair Mobility, Compensatory technique education, Neuromuscular Reeducation, Gait training  PT Frequency:  (2-4x/wk  )    Recommendation  Recommendation: Short-term skilled PT  Equipment Recommended: Defer at this time    Problem List  Patient Active Problem List   Diagnosis   ??? Abdominal abscess   ??? Enterocutaneous fistula   ??? Malnutrition   ??? Fistula   ??? Adjustment disorder with mixed anxiety and depressed mood   ??? Pain of multiple sites   ??? Cognitive dysfunction-recent onset.        Past Medical History  Past Medical History   Diagnosis Date   ??? Cancer    ??? Abscess of abdominal cavity    ??? Gangrene of foot    ??? AKI (acute kidney injury)    ??? Fistula    ??? Peritonitis and retroperitoneal infections    ??? Cachexia    ??? Hyperkalemia    ??? Dehydration    ??? Encephalopathies    ??? Ischemic foot    ??? 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 Dilaudid IV q3h prn reduces it to 5/10 but does not last.  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 No hist        Past Surgical History  Past Surgical History   Procedure Laterality Date   ??? Colon surgery         Cognition:  Orientation Level: Oriented X4     Pain:  Pain Score:   9  Pain Location: Generalized  Pain Descriptors: Aching;Constant;Discomfort  Pain Intervention(s): Nsg aware and pain meds not due until 1400         Mobility:   Pt refused mobility until pain meds refused. Explained to pt that this therapist will not be able to return to transfer this date. Planned an earlier time at 11:30am for tx session tomorrow to see if it is better for pain management.         Exercise:     Pt cont on TCNA program for stretching to B LEs 3-5x/wk. Pt refused activity at this time until pain meds received.          Patient Education  Pt encouraged to get OOB to w/c or recliner with hoyer lift as pt states she is NWB on B  LEs. Dr. Luan Pulling consult note from 08/05/15 states pt is NWB for 2 weeks. Dr. Simeon Craft sent note today stating pt is full wt bearing by way of his RN. Pt and pt's sister whom this therapist spoke to during session state pt is NWB on B LEs at this time. Pt with ischemic R foot/toes with increased pain with wt bearing. Pt educated re; requesting pain meds according to therapy schedule as therapy schedule had been same time for ~3 weeks, benefits of OOB, POC. Explained to pt that by the time she was hoyer lifted into chair that it would be time for her to have pain meds. Pt cont be frustrated and cry throughout time present and declined OOB. Pt's sister on phone and aware and in agreement with therapist to get OOB. Plan to cont with POC as tol on next visit.     Time  Start Time: 1307  Stop Time: 1335  Time Calculation (min): 28 min    Charges       $Therapeutic Activity: 1 unit     Rulon Abide, PT, DPT [phone]

## 2015-08-07 NOTE — Unmapped (Signed)
Complains of nausea with no emesis or dry heaves  Medicated with zofran 4 mg IV

## 2015-08-07 NOTE — Unmapped (Addendum)
PSYCHIATRY CONSULT, INITIAL EVALUATION    Linda Ponce [id number]     Date/time of admission: 07/08/2015  4:36 PM    CC/Reason for Consult: medication management for depression    HPI: 45 y.o. female with complicated medical course referred for treatment of depression and anxiety.     From H&P:  Linda Ponce is a 45 y.o. year old Black or [place] American female and, per [facility] and H&P, with complex past medical and surgical history including modified posterior pelvic exenteration and HIPEC in 2011 [at [facility] in [place]] for what turned out to be a perforated rectal adenocarcinoma. Her operative course was complicated by a rectovaginal fistula with recurrent pre-sacral abscesses and urological complications which required bilateral nephrostomy tubes  Linda Ponce reported having had multiple abdomonal surgeries for abscesses and fistulas over the ensuing 6 yrs per patient. Per her [facility] EMR, most recently on 05/31/2015, she was admitted to [facility] in [place] for incision and drainage of a complex abdominal wall abscess, rectal EUA, cystourethroscopy, bilateral retrograde pyelograms with replacement of a malfunctioning left uretal stent and right ureter embolization, right nephrostomy tube exchange, left nephrostomy tube placement and pelvic drain exchange on 06/06/2015.?? She was discharged to home on 06/15/15, and was brought to [place] by her sister the next day to help care for her as she recovered. Her son stayed in [place] with her mother. She became more ill at her sister's home, stopped eating, and became weak and confused. She was admitted to [facility] on 06/25/15 and diagnosed with severe sepsis secondary to peritonitis with stool coming from multiple abdominal wounds complicated by respiratory failure, AKI, and?? significant delirium/encephalopathy. She was made NPO with bowel rest and placed on TPN. She also developed gangrene of the right foot due to blood clot  which was treated with an EIA stent on 06/27/15. Dr. Simeon Craft has evaluated and will follow as outpatient to consider transmetatarsal amputation at a later date. After treatment and stabilization, Linda Ponce was transferred to [facility] for Post-Acute Care on 07/08/15, but was acuted out to the [facility] same day when a large amount of blood came out of her midline abdominal wound and she became hypotensive. Lab work demonstrated leukocytosis and CT scan showed multiple abdominal abscesses with large pelvic abscess. After treatment and stabilization, she was again discharge to [facility] on 07/08/2015 for COC including IV ABX, medical management, TPN/nutritional management, wound care, and comprehensive therapies.??       Pt endorsed depression, anxiety to Dr. Minerva Ends  Dr.LOren found cognitive dysfunction on examination    To me today, she minimized all symptoms, saying that she just wants to get back to her normal life  She endorses mild depression but says it is not impacting her. Denies anhedonia, lack of energy, lack of concentration  She adamantly denies passive or active suicidal ideation or plan-- is future oriented    Denies any history of psych treatment, no history of mania or psychosis  .  She says that she doesn't want to take any more medications and is worried about antidepressants    context: depression  severity: mild/moderate  location: mood disturbance  associated symptoms: anxiety  modifiers: medical illness  duration: subacute/      Past Psychiatric History:    Prior hospitalizations:denies   Prior diagnoses:adjustment disorder, diagnosed in hospital   Prior medication trials:ativan, ambien, flexeril   Outpatient Treatment: none   Suicide Attempts:denies      Substance Use  History:   denies    Past Medical History:   Past Medical History   Diagnosis Date   ??? Cancer    ??? Abscess of abdominal cavity    ??? Gangrene of foot    ??? AKI (acute kidney injury)    ??? Fistula    ??? Peritonitis and  retroperitoneal infections    ??? Cachexia    ??? Hyperkalemia    ??? Dehydration    ??? Encephalopathies    ??? Ischemic foot    ??? 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 Dilaudid IV q3h prn reduces it to 5/10 but does not last.  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 No hist     Past Surgical History   Procedure Laterality Date   ??? Colon surgery         Social/Developmental History:    23 yo son with her mother in [place]   She is in [place] to be closer to her sister to help care for her       Family History:   History reviewed. No pertinent family history.   Medical:   Psychiatric:denies       Allergies:  Allergies   Allergen Reactions   ??? Compazine [Prochlorperazine Edisylate]    ??? Latex, Natural Rubber        Home Medications:   No current facility-administered medications on file prior to encounter.     No current outpatient prescriptions on file prior to encounter.       Current Medications ordered:  Scheduled Meds:  ??? clopidogrel  75 mg Per NG tube Daily 0900   ??? enoxaparin  40 mg Subcutaneous Daily 0900   ??? famotidine (PF)  20 mg Intravenous BID   ??? fentaNYL  1 patch Transdermal Q72H   ??? fluconazole in NaCl (iso-osm)  200 mg Intravenous Q24H   ??? gabapentin  125 mg Oral 3 times per day   ??? insulin regular  0-5 Units Subcutaneous Daily 0900   ??? oxyCODONE  10 mg Oral Q4H2 SCH   ??? piperacillin-tazobactam (ZOSYN) IV extended interval  3.375 g Intravenous Q8H   ??? vancomycin  500 mg Intravenous Q12H     Continuous Infusions:  ??? TPN ADULT ([facility]) 69.8 mL/hr at 08/06/15 1702   ??? TPN ADULT ([facility])       PRN Meds:.ALPRAZolam, alteplase, dextrose, dextrose 50 % in water (D50W), diclofenac sodium, heparin lock flush, HYDROmorphone,  ipratropium-albuterol, LORazepam, ondansetron    ROS: Denies trouble with fever, rash, headache, vision changes, chest pain, shortness of breath, nausea, , weakness, dysuria., no recently bleeding or infections  + extremity pain + pain in wound sites    OBJECTIVE:  Vitals:  .  Filed Vitals:    08/06/15 0601 08/06/15 1113 08/06/15 2023 08/07/15 0605   BP: 105/59 96/64 109/63 103/50   Pulse: 99 124 122 119   Temp: 98 ??F (36.7 ??C) 98.3 ??F (36.8 ??C) 98.3 ??F (36.8 ??C) 97.4 ??F (36.3 ??C)   TempSrc: Oral Oral Oral Oral   Resp: 19 22 18 20    Height:       Weight:       SpO2: 97% 99% 100% 96%       Physical Exam:  Reviewed that done by primary team    Mental Status Exam: pt well groomed, pleasant and cooperative  Appearance: appears stated age, good eye contact  Behavior/Movement/Muscle Tone: no PMR/PMA, no abnormal movements appreciated,  Gait not tested due to illness  Speech:tone, prosody, volume, rate, production wnl, fluent nonpressure  Mood/Affect: euthymic, full range with slight irritability, dysphoria, mood congruent, content appropriate  Thought Process: goal directed, linear, no FOI, no LOA  Thought Content: Denies SI/HIV, no delusions expressed, no AH/VH and not RTIS, future oriented  Orientation: alert, oriented to person place and situation  Attention and concentration : intact  Fund Of Knowledge: appears consistent with average intellect  Memory:grossly intact to recent and remote content discussed  Insight/Judgment: moderate moderate    Labs:   Complete Blood Count:  Recent Labs      08/05/15   0351   WBC  6.6   HGB  8.1*   HCT  24.5*   MCV  93.6   PLT  576*       Basic Metabolic Panel:  Recent Labs      08/05/15   0351   NA  142   K  4.0   CL  109   CO2  26   BUN  24   MG  1.7   PHOS  3.9       Hepatic Panel:  Recent Labs      08/05/15   0351   AST  17   ALT  16   ALBUMIN  <1.5*   <1.5*          Imaging: From review of outside medical record, head CT done at [facility] 3/17  EXAMINATION:  CT OF THE HEAD  WITHOUT CONTRAST  06/25/2015 6:47 am    TECHNIQUE:  CT of the head was performed without the administration of intravenous  contrast. Dose modulation, iterative reconstruction, and/or weight based  adjustment of the mA/kV was utilized to reduce the radiation dose to as low  as reasonably achievable.    COMPARISON:  None.    HISTORY:  ORDERING SYSTEM PROVIDED HISTORY: AMS  TECHNOLOGIST PROVIDED HISTORY:  Acuity: Acute  Type of Encounter: Initial    Altered mental status.    FINDINGS:  BRAIN/VENTRICLES: There is no acute intracranial hemorrhage, mass effect or  midline shift.  No abnormal extra-axial fluid collection.  The gray-white  differentiation is maintained without evidence of an acute infarct.  There is  no evidence of hydrocephalus.    ORBITS: The visualized portion of the orbits demonstrate no acute abnormality.    SINUSES: The visualized paranasal sinuses and mastoid air cells demonstrate  no acute abnormality.    SOFT TISSUES/SKULL:  No acute abnormality of the visualized skull or soft  tissues.     Status         EKG 07-18-15  Ventricular Rate: ??135 ??BPM  Atrial Rate: ??135 ??BPM  P-R Interval: ??152 ??ms  QRS Duration: ??56 ??ms  QT: ??272 ??ms  QTc: ??408 ??ms    Reviewed old medical record. No psychiatric records found    Axis I: Adjustment disorder with mixed depression and anxiety, cognitive disorder  Axis II:  Axis III:  Axis IV:  Axis V: GAF 60    ASSESSMENT AND RECOMMENDATIONS: 45 y.o. female admitted to [facility] on 07/08/2015 for COC    1. Adjustment disorder with depression : affecting care here  Discussed medications with pt-- she wants to try therapy first  I gave her  information and educated her about SNRIs and help with pain and depression : she will think about it    2. Agitation recs: Patient is not at risk for agitation, in the event of which we would recommend considering the following:   -recommend 1:1 observation: Not Applicable   -prn med options:     3. Delirium: Patient is at risk for  delirium.   -Nonpharmacologic evidence-based delirium precautions include the following: frequently reorient, shades up during day/down at night, TV off except for soft music only, have familiar objects at bedside, encourage friends/family to spend the night, minimize anticholingeric medications, minimize polypharmacy, minimize restraints (includes lines and/or foleys and/or feeding tubes as able) and minimize overnight checks/vitals to encourage restful consistent sleep patterns    4. Substance Abuse/Dep/Withdrawal: no active issues    5. Safety: doesrequire admission to inpatient psychiatry once medically stable   Risk Assessment: The patient has the following risk factors for violence to self/others: chronic medical illness, chronic pain.  Protective factors include:  positive social support, future orientation, sobriety.  Overall the risk is low,  for future dangerousness.  However the patient's imminent risk is low,   Thank you for this consult, please call us with any further questions. We will continue to follow at this time.    Linda Ponce BETH Darlene Bartelt

## 2015-08-07 NOTE — Unmapped (Signed)
[place]    [name]   Wound Care Assessment  08/07/2015       Linda Ponce is an 45 y.o. female.  DOB: [birth year]    Pressure ulcers on admission:  One STAGE: III on coccyx              One UNSTAGEABLE on right lateral hip    Diagnosis: jewish 07/05/15 dx: sepsis    Allergies   Allergen Reactions   ??? Compazine [Prochlorperazine Edisylate]    ??? Latex, Natural Rubber        History:    Past Medical History   Diagnosis Date   ??? Cancer    ??? Abscess of abdominal cavity    ??? Gangrene of foot    ??? AKI (acute kidney injury)    ??? Fistula    ??? Peritonitis and retroperitoneal infections    ??? Cachexia    ??? Hyperkalemia    ??? Dehydration    ??? Encephalopathies    ??? Ischemic foot    ??? 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 Dilaudid IV q3h prn reduces it to 5/10 but does not last.  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 No hist     Past Surgical History   Procedure Laterality Date   ??? Colon surgery       Social History   Substance Use Topics   ??? Smoking status: Never Smoker    ??? Smokeless tobacco: None   ??? Alcohol Use: None     Present admission history of illness/MD note.  47f with history of colorectal CA, initially admitted to [facility] post operatively for continued wound care. Developed bleeding, sent back to [facility] where she was foundto have multiple pelvic abscesses, enterocutaneous fistulas, was kept NPO, started on IV abx, TPN, and returns for continued care.    Skin/Wound Care Assessment.    WCT assessed mid abdomen fistula, left abdomen fistula, left groin drain, right lower abd colostomy, appliance changes done to all of these areas, with patient assisting with care.  Patient was premedicated,does c/o discomfort with care, takes  long time for patient to remove and clean areas and then have pouches reapplied. Does become very anxious with care and tearful. Patient does help but does not appear that she could do entire pouch change without assistance. Due to her anxiety requires additional time for care. Took 1 1/2 hours for pouch changes to the four areas listed. Wound care team deferred assessment for today of left foot, coccyx and bilateral nephrostomy tubes. Informed nurse Golda Acre of care that was provided and drsg changes not done today by [facility]. Will attempt to assess these areas within next 2 days.       Braden: 18     Pressure wounds:      Coccyx STAGE:   III  This wound assessment deferred per WCT today, patient c/o pain with repositioning to side for assessment.Will attempt to assess  within next 2 days . Assessment from 07-31-15:  Measures:  L 0.5 cm X W 0.5 cm X D  0.01 cm.   Wounds progress:  Improving  Wound Assessment:  Decreasing in size, open area of moist pink tissue, now very superficial tissue loss, no  drainage, no odor noted. Edges flat, attached. Periwound skin intact.   Treatment/Dressing: Clean with normal saline, pat dry. Apply fibracol to wound bed and cover with Mepilex border. Change every other day and prn dressing failure.      _____________________________________________________________________  Surgical wounds:????????????????????????????????????????????????????????????    Mid Abdominal Wound with fistula???? Full Thickness. Measurements: 9 cm L x 4.3 cm W x 1 cm D  Wounds progress:??no significant change  Wound Assessment:?? Wound bed with moist bright pink tissue, granulation tissue with areas of mucosal/bowel tissue.  Well defined edges. Large amount liquid gold brown effluent draining into foley bag. Periwound skin clean and  intact. Pouch has stayed intact approximately 7 days wear time, just beginning to leak, appears to have leaked into small pouches of colostomy and  left groin drainage tube. .  Treatment/Dressing: Mid abdominal and  left abdominal fistula/wounds being managed with Eakins pouches attached to gravity drainage bags. Clean wounds and surrounding skin with soap and water, pat dry. Attach Eakins pouch using Eakins rings to help with adherence. Use pouch [id number] for mid abdomen and smaller pouch [phone] for left abdomen. Change pouches weekly and prn for leakages.      ____________________________________________________________________  Surgical wounds:??????  ??????  Left Lateral Abdomen Fistula???? Full Thickness. Measurements L 3.5?? cm X W 1 cm X?? D 1 cm  Wounds progress: improved, smaller in size  Wound Assessment:??Moist pink tissue, edges with granulation and epithelial tissue. Is smaller in size, has area at 6 oclock with divit/dimpling. Large amount liquid brown effluent draining into foley bag.?? Periwound skin clean and intact.?? This pouch has been intact for 5 days. Intact but is beginning to peel around edges??  Treatment/Dressing:?? Patient assisted with pouch removal and skin cleaning. Left abdominal fistula/wound being managed with Eakins pouch attach to gravity drainage bags. Clean wounds and surrounding skin with soap and water, pat dry. Attach Eakins pouch using Eakins rings to help with adherence. Used smaller pouch [id number] today  for left abdomen. Change pouches weekly and prn for leakages.         _____________________________________________________________________  Surgical wounds:????  ????????  Drain of left groin ?? ??  Wounds progress:????No change??  Wound Assessment: Drain intact, sutured in place.Had small amount of drainage in pouch but does not appear to be from tube, appears to have leaked from large mid abdomen fistula into this pouch which was starting to lift at edges.. Peri-wound clean/dry/intact  Treatment/Dressing: Drain to left groin is open, apply small Hollister pouch #[id number] to this, change pouch weekly and prn dressing failure.    *Changed  pouch.    _____________________________________________________________________  Surgical wounds:??????  ??????  Bilateral nephrostomy drains,  This  assessment deferred per WCT today, patient c/o pain with repositioning to side for assessment.Will attempt to assess  within next 2 days . Assessment from 07-31-15:  Wounds progress:??UTA  Wound Assessment: Able to visualize left dressing which is clean/dry/intact, pt would not allow WCT to visualize right dressing. Bilateral tubes to closed system drainage bag, gold liquid in drainage bags. Pt declined dressing change to either site.  Treatment/Dressing: Bilateral nephrostomy tubes both to drainage pouches. Dressing changes every 3 days and prn for dressing failure. Clean sites with CHG, allow to dry. Apply AMD slit drain gauze to site, cover with Tegaderm. Position tubing so that patient is not lying on tubing.      ____________________________________________________________________        Vascular Wounds on Right Foot????   This wound assessment deferred per [facility]  today, patient c/o pain with foot and refusing dressing change to foot after extensive pouch changes done. Informed Lisa, nurse, that [facility] did not do todays dressing change. .Will attempt to assess  within next 2 days . Assessment from 07-31-15:  ????      Wounds progress:????declining  Wound Assessment:?? Dry black eschar to dorsal portion of 1st through 5th toes and plantar portion of 2nd though 5th toes, great toenail has fallen off. Newly opened areas to plantar distal third of foot and plantar great toe, has superficial thickness tissue loss, edges loosely attached, small serosanguinous drainage, extremely painful per pt. Eschar has not extended beyond marked line.   Treatment/Dressing: Called Dr. Janifer Adie (podiatry) office, spoke to his nurse Toniann Fail, who was able to communicated with him. There is concern of this being wet gangrene vs. being friction related, they suggest using mepitel to the open areas (will use  adaptic touch as this is our most equivalent product) and cover with a dry dressing, daily, stressed importance of not adding moisture to wound bed. Will continue betadine daily to stable eschar areas.         _____________________________________________________________________  Surgical wounds:    Colostomy/mucsous fistula ????RLQ  Wound Progress:  No change, stable   Wound Assessment:??Moist red budded stoma, peri-wound clean/dry/intact. Small amount of mucous drainage from ostomy, small amount loose brown drainage in pouch (does not appear to be from the colostomy but from the mid abd fistula drainage d/t to both pouches lifting)  Wound Treatment: Right lower quadrant colostomy currently only functioning as mucous fistula. Keep covered with small ostomy pouch Hollister # [id number] or Myna Bright [phone]. Change weekly and prn dressing failure.     Changed pouch, using the smaller Eakins #[id number].     ______________________________________________________________    Treatment time: 1nurses 1 3/4 hours, only doing the pouches.????????????????    Preventative measures.    Off loading and pressure relief as measures are using an accumax mattress, Turning Wedge, pillows, and waffle boots to aid in off load of pressure points.  Other measures include lotion's and cream's to Hydrate and moisturize dry skin.  Moisture barrier cream Criticaid clear is provided to protect groin and peri anal area.. Currently has foley catheter with statlock in place.         Labs:   Lab Results   Component Value Date    GLUCOSE 91 08/05/2015    BUN 24 08/05/2015    CO2 26 08/05/2015    CREATININE 0.44* 08/05/2015    K 4.0 08/05/2015    NA 142 08/05/2015    CL 109 08/05/2015    CALCIUM 7.7* 08/05/2015     Lab Results   Component Value Date    WBC 6.6 08/05/2015    HGB 8.1* 08/05/2015    HCT 24.5* 08/05/2015    MCV 93.6 08/05/2015    PLT 576* 08/05/2015     No results found for: PTT, INR  Lab Results   Component Value Date    PREALBUMIN 16.0* 07/09/2015     Lab  Results   Component Value Date    ALBUMIN <1.5* 08/05/2015    ALBUMIN <1.5* 08/05/2015     Lab Results   Component Value Date    ESR 15 08/04/2015    CRP 96.9* 08/04/2015

## 2015-08-07 NOTE — Unmapped (Signed)
Pt refused to get morning blood sugar checked stating that it's too much, day shift RN  Notified, will offer pt CBG check again.

## 2015-08-07 NOTE — Unmapped (Signed)
Clinical Pharmacy Note - TPN Order    Linda Ponce is a 45 y.o. female that is being followed by the clinical pharmacy department for monitoring and adjustment of TPN.    Patient continues NPO with ice chips, sips of clear liquid.    Plan:    -no new TPN labs today  -routine labs scheduled for tomorrow  -continue current continuous TPN; no changes    Linda Ponce Linda Ponce [facility] 08/07/2015 8:07 AM

## 2015-08-07 NOTE — Unmapped (Signed)
Problem: Knowledge Deficit  Goal: Patient/family/caregiver demonstrates understanding of disease process, treatment plan, medications, and discharge instructions  Complete learning assessment and assess knowledge base.   Outcome: Progressing  Reviewed to call for assitance

## 2015-08-08 LAB — MAGNESIUM: Magnesium: 1.8 mg/dL (ref 1.5–2.5)

## 2015-08-08 LAB — RENAL FUNCTION PANEL W/EGFR
Albumin: <1.5 g/dL — ABNORMAL LOW (ref 3.5–5.7)
Anion Gap: 6 mmol/L (ref 3–16)
BUN: 27 mg/dL — ABNORMAL HIGH (ref 7–25)
CO2: 26 mmol/L (ref 21–33)
Calcium: 8.2 mg/dL — ABNORMAL LOW (ref 8.6–10.3)
Chloride: 107 mmol/L (ref 98–110)
Creatinine: 0.54 mg/dL — ABNORMAL LOW (ref 0.60–1.30)
Glucose: 97 mg/dL (ref 70–100)
Osmolality, Calculated: 293 mosm/kg (ref 278–305)
Phosphorus: 4.1 mg/dL (ref 2.1–4.7)
Potassium: 4.2 mmol/L (ref 3.5–5.3)
Sodium: 139 mmol/L (ref 133–146)
eGFR AA CKD-EPI: >90 See note.
eGFR NONAA CKD-EPI: >90 See note.

## 2015-08-08 LAB — POC GLU MONITORING DEVICE
POC Glucose Monitoring Device: 62 mg/dL (ref 70–100)
POC Glucose Monitoring Device: 101 mg/dL (ref 70–100)
POC Glucose Monitoring Device: 106 mg/dL — ABNORMAL HIGH (ref 70–100)
POC Glucose Monitoring Device: 108 mg/dL — ABNORMAL HIGH (ref 70–100)

## 2015-08-08 MED ORDER — fentaNYL (DURAGESIC) 75 mcg/hr 1 patch
75 | TRANSDERMAL | Status: AC
Start: 2015-08-08 — End: 2015-08-14
  Administered 2015-08-08 – 2015-08-14 (×3): 1 via TRANSDERMAL

## 2015-08-08 MED ORDER — vancomycin (VANCOCIN) 750 mg in sodium chloride 0.9 % 150 mL IVPB
Freq: Two times a day (BID) | INTRAVENOUS | Status: AC
Start: 2015-08-08 — End: 2015-08-12
  Administered 2015-08-09 – 2015-08-12 (×8): 750 mg via INTRAVENOUS

## 2015-08-08 MED ORDER — TPN ADULT (WCH/Drake)
100 | INTRAVENOUS | Status: AC
Start: 2015-08-08 — End: 2015-08-09
  Administered 2015-08-08: 21:00:00 via INTRAVENOUS

## 2015-08-08 MED FILL — PIPERACILLIN-TAZOBACTAM 3.375 GRAM INTRAVENOUS SOLUTION: 3.375 3.375 gram | INTRAVENOUS | Qty: 1

## 2015-08-08 MED FILL — FLUCONAZOLE 200 MG/100 ML IN SOD. CHLORIDE (ISO) INTRAVENOUS PIGGYBACK: 200 200 mg/100 mL | INTRAVENOUS | Qty: 100

## 2015-08-08 MED FILL — VANCOMYCIN 500 MG INTRAVENOUS SOLUTION: 500 500 mg | INTRAVENOUS | Qty: 500

## 2015-08-08 MED FILL — OXYCODONE 5 MG TABLET: 5 5 MG | ORAL | Qty: 2

## 2015-08-08 MED FILL — GABAPENTIN 250 MG/5 ML ORAL SOLUTION: 50 50 mg/mL | ORAL | Qty: 5

## 2015-08-08 MED FILL — HYDROMORPHONE 2 MG/ML INJECTION SYRINGE: 2 2 mg/mL | INTRAMUSCULAR | Qty: 2

## 2015-08-08 MED FILL — FAMOTIDINE (PF) 20 MG/2 ML INTRAVENOUS SOLUTION: 20 20 mg/2 mL | INTRAVENOUS | Qty: 2

## 2015-08-08 MED FILL — FENTANYL 75 MCG/HR TRANSDERMAL PATCH: 75 75 mcg/hr | TRANSDERMAL | Qty: 1

## 2015-08-08 MED FILL — CLOPIDOGREL 75 MG TABLET: 75 75 mg | ORAL | Qty: 1

## 2015-08-08 MED FILL — TRAVASOL 10 % INTRAVENOUS SOLUTION: 10 10 % | INTRAVENOUS | Qty: 799

## 2015-08-08 MED FILL — VANCOMYCIN 1,000 MG INTRAVENOUS INJECTION: 1000 1000 mg | INTRAVENOUS | Qty: 750

## 2015-08-08 MED FILL — ONDANSETRON HCL (PF) 4 MG/2 ML INJECTION SOLUTION: 4 4 mg/2 mL | INTRAMUSCULAR | Qty: 2

## 2015-08-08 NOTE — Unmapped (Signed)
Physical Therapy                                              Physical Therapy Treatment     Name: Negin Hegg  DOB: [birth year]  Attending Physician: Valda Lamb, MD  Admission Diagnosis: jewish 07/05/15 dx: sepsis  Date: 08/08/2015    Reviewed Pertinent hospital course: Yes  Hospital Course PT/OT: 46f with history of colorectal CA, initially admitted to [facility] post operatively for continued wound care. Developed bleeding, sent back to [facility] where she was foundto have multiple pelvic abscesses, enterocutaneous fistulas, was kept NPO, started on IV abx, TPN, and returns for continued care. PRECAUTIONS: FULL CODE, AAT, NO WEIGHT BEARING RESTRICTIONS, ISCHEMIC R TOES, MULTIPLE DRAINS FROM ABDOMEN AND BACK, (updated 08/05/14-pt now NWB on B LEs x 2 weeks per Dr. Luan Pulling)     Assessment     Assessment: Impaired Bed Mobility, Impaired Transfer Mobility, Impaired Ambulation, Impaired Balance, Deconditioning, Impaired Strength, Impaired ROM  Prognosis: Guarded (due to multiple medical issues )     Attempted to tx pt initially at scheduled time of 11:30am as pt was due for pain meds at that time with wound care RN. Pt allowed wound care to dress buttocks with rolling but declined putting hoyer pad underneath her, stating,  I need to get myself together. I need to wash my face. I just woke up. Pt then provided wash cloths and bath water with soap. Pt requested therapists go to other side of curtain while she bathed herself. Pt then got on phone with her mother while bathing. This therapist told pt she would come back later to help her get OOB around 12:45pm. Pt demo verbal understanding. Pt agreed to hoyer transfer with OT at later time and tolerated sitting up in w/c >2 hours. Plan to cont with strengthening, endurance, and balance to maximize functional mobility. Pt left sitting up in recliner at EOS and later returned BTB at 1600 with nsg with all needs in reach positioned to comfort.     Goals  Pt Will Go Supine To  Sit: Minimal (to get in and out of bed within 4 weeks. )  Sit To Stand: Min A with LRAD to prepare for gait and transfers within 6 weeks.   Pt Will Transfer Bed/Chair: Minimal (with LRAD to increase independence in home within 6 weeks. )  Pt Will Ambulate: Minimal (25' with RW to access home within 6 weeks. )  Miscellaneous Goal #1: Pt will propel w/c 150' with B UEs with supervision to increase independence in home and community within 6 weeks.   Long Term Goal : Pt will be independent with HEP to improve B LE strength to facilitate safe mobility within 8 weeks.   Time frame for goals to be met in: 8 weeks, 09/14/15    Recommendation  Plan  Treatment/Interventions: LE strengthening/ROM, Endurance training, Patient/family training, Equipment eval/education, Museum/gallery curator, Therapeutic Exercise, Therapeutic Activity, Continued evaluation, Wheelchair Mobility, Compensatory technique education, Neuromuscular Reeducation, Gait training  PT Frequency:  (2-4x/wk )    Recommendation  Recommendation: Short-term skilled PT  Equipment Recommended: Defer at this time    Problem List  Patient Active Problem List   Diagnosis   ??? Abdominal abscess   ??? Enterocutaneous fistula   ??? Malnutrition   ??? Fistula   ??? Adjustment disorder with mixed anxiety and depressed mood   ???  Pain of multiple sites   ??? Cognitive dysfunction-recent onset.        Past Medical History  Past Medical History   Diagnosis Date   ??? Cancer    ??? Abscess of abdominal cavity    ??? Gangrene of foot    ??? AKI (acute kidney injury)    ??? Fistula    ??? Peritonitis and retroperitoneal infections    ??? Cachexia    ??? Hyperkalemia    ??? Dehydration    ??? Encephalopathies    ??? Ischemic foot    ??? Pain of multiple sites 08/06/2015     Pain involves abdominal abscess and fistula related wounds, gangrenous feet R>L with neuropathic componenet, low back from multiple surgeries & radiation with neuropathic component, Pt reports severe pain ranging from 8-10/10 even with fentanyl 125 mcg  patch. Dilaudid IV q3h prn reduces it to 5/10 but does not last.  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 No hist        Past Surgical History  Past Surgical History   Procedure Laterality Date   ??? Colon surgery         Cognition:  Overall Cognitive Status: Within Functional Limits  Orientation Level: Oriented X4     Pain:  Pain Score:   8  Pain Location: Generalized  Pain Descriptors: Aching  Pain Intervention(s): Rest;Repositioned  Therapist reported pain to:: Nsg provided pain meds right before tx session          Mobility:  Bed Mobility  Rolling: Min assist to right;Max assist to left (with B rails and max v.c. for hand placement)  Transfers  Bed to Chair: Dependent (per podiatry orders pt NWB used hoyer) A x 2   Chair to Bed: Dependent (A x 2 hoyer )      RA present to stretch pt's B LEs and pt stated that it could be done tomorrow since they did it everyday this week.               Patient Education  Importance of OOB, NWB status, safety with all mobility     Time  Start Time: 1300  Stop Time: 1331  Time Calculation (min): 31 min    Charges       $Therapeutic Activity: 31 mins    Rulon Abide, [place], DPT [phone]

## 2015-08-08 NOTE — Unmapped (Signed)
Occupational Therapy  [facility]  Occupational Therapy      Name: Linda Ponce  DOB: February 11, 1971  Attending Physician: Valda Lamb, MD  Admission Diagnosis: jewish 07/05/15 dx: sepsis  Date: 08/08/2015    Reviewed Pertinent hospital course: Yes   Hospital Course PT/OT: 67f with history of colorectal CA, initially admitted to [facility] post operatively for continued wound care. Developed bleeding, sent back to [facility] where she was foundto have multiple pelvic abscesses, enterocutaneous fistulas, was kept NPO, started on IV abx, TPN, and returns for continued care. PRECAUTIONS: FULL CODE, AAT, NO WEIGHT BEARING RESTRICTIONS, ISCHEMIC R TOES, MULTIPLE DRAINS FROM ABDOMEN AND BACK       Brief Assessment  Assessment  Assessment: Decreased ADL status, Decreased self-care trans, Decreased Functional Mobility, Decreased activity tolerance  Prognosis: Guarded  Goal Formulation: Other (comment) (per therapist )    Goals  Pt Will demonstrate functional chair transfer:  (tolerate assessment; by July 17, 2015)  Pt Will participate in upper extremity HEP to prep for ADLs: strengthening / activity tolerance for functional use: Ongoing   Miscellaneous Goal #1: Patient will tolerate sitting EOB with maximal assist: By July 17, 2015  Long Term Goal : Patient to be seen 2-3 week trial period to determine if she can participate and benefit from active OT programming  Time frame for goals to be met in: 3 weeks     Recommendation  Plan  Treatment Interventions: Compensatory technique education, Therapeutic Activity, Patient/Family training, Excercise, ADL retraining, Functional transfer training, Activity Tolerance training, Energy Conservation  OT Frequency: 1-3x/wk    Cognition  Overall Cognitive Status: Within Functional Limits    Pain  Pain Score:   8  Pain Location: Generalized  Pain Descriptors: Aching  Pain Intervention(s): Medication (See eMAR)     Mobility  Bed Mobility  Rolling: Max assist to left;Max  assist to right  Functional Transfers  Bed to Chair: Dependent (per podiatry orders pt NWB used hoyer)       ADL  Where Assessed: Supine, bed  Grooming Assistance: Stand by  Grooming Deficit: Setup  Bathing Assistance: Stand by  Bathing Deficit: Setup;Increased time to complete      Pt initially scheduled for PT/OT at 1130; upon arrival pt with increase anxiety, crying and requesting therapist just give me a minute. Again educated pt on the schedule, importance of that schedule and planning ahead. Pt continued to refuse and cry. Pt did complete bathing and dressing supine in bed.  Returned at 1 pt in better spirits and agreed to out of bed via [employer]; pt set up in recliner in tv room; RN aware.  Cont per POC in colboration with OTR/L  [name], COTA/L    Patient Education:  Refer above     Time  Start Time: 1315  Stop Time: 1355  Time Calculation (min): 40 min    Charges       $Therapeutic Activity: 23-37 mins  $Self Care/ADL/Home Management Training: 8-22 mins

## 2015-08-08 NOTE — Unmapped (Signed)
Problem: Knowledge Deficit  Goal: Patient/family/caregiver demonstrates understanding of disease process, treatment plan, medications, and discharge instructions  Complete learning assessment and assess knowledge base.   Discussion plan of care

## 2015-08-08 NOTE — Unmapped (Signed)
Hospitalist Progress Note    08/08/2015     Matalie Statzer   LOS: 31 days       HPI;    Patient is s/p OR on 05/31/2015 with [facility] for incision and drainage of complex abdominal wall abscess, rectal EUA, cystourethroscopy, bilateral retrograde pyelograms and removal old stent and placement of left 6x24 firm stent and right ureter embolization, right nephrostomy tube exchange, left nephrostomy tube placement and pelvic drain exchange on 06/06/2015 at a hospital in [place]. She was admitted to [facility] with severe sepsis secondary to peritonitis with stool coming from multiple abdominal wounds. She was seen by Critical Care, general surgery, and ID. General surgery recommended NPO and bowel rest and TPN has been started. She is receiving continued dressing changes and nephrostomy tube care. In addition, she was noted to have gangrene of the right foot and Dr. Donzetta Sprung performed angiogram, thrombectomy, and EIA stent on 06/27/15. Dr. Simeon Craft has evaluated and will follow as outpatient to consider transmetatarsal amputation at a later date. Patient is hemodynamically stable. She was  to [facility] and will need continued wound care, IV Vanc and Zosyn, and TPN.  She was sent back to [facility] on 07/05/2015  with abdominal pain and leukocytosis.CT scan showed multiple abdominal abscesses with large pelvic abscess . Her Hgb was 6.8 in ED and she was transfused 1 unit of blood.     Subjective & Interval History;    Pt examined at the bedside.  Looking better today. Pain is under better control today.  Fistula has significant output.  Vital signs are stable  Except tachycardia.    No fever, chills, SOB, CP, N/V.  Right foot still painful and tender.    Review of Systems:   as above    PMH: Unchanged from H&P.   PSH: Unchanged from H&P.   Family medical history: Unchanged from H&P.   Social history: Unchanged from H&P      Scheduled Meds:  ??? clopidogrel  75 mg Per NG tube Daily 0900   ??? enoxaparin   40 mg Subcutaneous Daily 0900   ??? famotidine (PF)  20 mg Intravenous BID   ??? fentaNYL  1 patch Transdermal Q72H   ??? fluconazole in NaCl (iso-osm)  200 mg Intravenous Q24H   ??? gabapentin  125 mg Oral 3 times per day   ??? insulin regular  0-5 Units Subcutaneous Daily 0900   ??? oxyCODONE  10 mg Oral 6x Daily   ??? piperacillin-tazobactam (ZOSYN) IV extended interval  3.375 g Intravenous Q8H   ??? vancomycin  750 mg Intravenous Q12H     Continuous Infusions:  ??? TPN ADULT ([facility]) 69.8 mL/hr at 08/07/15 1700   ??? TPN ADULT ([facility])       PRN Meds:ALPRAZolam, alteplase, dextrose, dextrose 50 % in water (D50W), diclofenac sodium, heparin lock flush, HYDROmorphone, ipratropium-albuterol, LORazepam, ondansetron    Objective:    Vital signs in last 24 hours:  Temp:  [97.6 ??F (36.4 ??C)-98.9 ??F (37.2 ??C)] 97.6 ??F (36.4 ??C)  Heart Rate:  [116-118] 118  Resp:  [19] 19  BP: (91-110)/(56-66) 91/56 mmHg    Physical Examination:    GENERAL: The patient is awake,not in acute cardiorespiratory distress. Looking chronically sick.  HEENT:Atraumatic.Normocephalic. PERRLA, EOMI. No icterus, no pallor, no jugular venous distention..No ear or nose discharge.  NECK: Supple. Trachea is in midline. No Thyromegaly  CHEST: Symmetric. Clear to auscultation, bilateral equal air entry. No wheezing or rhonchi.  CARDIOVASCULAR:  S1, S2, Regular pulse. No murmur or gallops. No JVD.  ABDOMEN: Whole anterior abdomen is covered with pouch. Brownish fistula output noted.Bilateral nephrostomy tubes.  EXTREMITIES: There is + BLE edema . Very tender to touch both legs.  Right toes are gangrenous.   Skin;No bruise,No rash.No petechiae.  CNS; CN II-XII grossly intact. No focal neurological deficit.       Intake/Output last 3 shifts:  I/O last 3 completed shifts:  In: 2692 [I.V.:462; IV Piggyback:700]  Out: 1250 [Urine:600; Drains:650]    Recent Labs;                           Lab name 07/31/15  0519 08/01/15  0621 08/05/15  0351   WBC 7.0 8.3 6.6   HEMOGLOBIN  9.0* 8.9* 8.1*   HEMATOCRIT 27.6* 27.0* 24.5*   MEAN CORPUSCULAR VOLUME 92.3 91.9 93.6   PLATELETS 562* 568* 576*                                                            Lab name 08/01/15  0621 08/05/15  0351 08/08/15  0950   SODIUM 140   140 142 139   POTASSIUM 4.3   4.3 4.0 4.2   CREATININE 0.42*   0.42* 0.44* 0.54*   BUN 28*   28* 24 27*   CHLORIDE 108   108 109 107   CO2 26   26 26 26    PHOSPHORUS 3.6   3.6 3.9 4.1   MAGNESIUM 1.8 1.7 1.8                         Lab name 07/22/15  0604 07/26/15  0746 07/29/15  0409 08/05/15  0351   AST 22 22 22 17    ALT 29 21 22 16    BILIRUBIN TOTAL 0.3 0.3 0.3 0.2       Lab name 07/05/15  0702  07/09/15  0544  08/01/15  0621 08/05/15  0351 08/08/15  0950   PREALBUMIN 14.0*  --  16.0*  --   --   --   --    ALBUMIN  --   < > 1.5*  < > <1.5* <1.5*   <1.5* <1.5*   < > = values in this interval not displayed.                                                                                                   Lab name 08/01/15  0621 08/05/15  0351   CALCIUM 7.5*   7.5* 7.7*   PHOSPHORUS 3.6   3.6 3.9                              Lab name 07/29/15  0409 08/05/15  0351   TRIGLYCERIDES 114 107  Lab name 08/01/15  0830 08/05/15  0900 08/07/15  0850   VANCOMYCIN TROUGH 10.6 9.3* 9.1*       Diet;    Diet Orders          TPN ADULT ([facility]) at 69.8 mL/hr starting at 04/27 1800    TPN ADULT ([facility]) at 69.8 mL/hr starting at 04/26 1800    Diet NPO effective now Except for: except ice chips, except sips of clears starting at 03/28 0981          Future Appointments:  No future appointments.    Assessment/Plan:      1. Peritoneal abscess- Mixed flora, pigtail drain 06/26/15 Refused followup CT, still requires one. Vanco/Zosyn/Diflucan.           Will complete 6 weeks IV therapy - projected stop 5/1 as per ID notes.    2. Enterocutaneous Fistula- wound care, bowel rest. Still has significant output.    3. Severe intractable acute on chronic pain syndrome- receiving  Dilaudid IV every 3 hr  Prn,Fentanyl patch decreased to 75 from 125  mcg. Started PO Oxycodone scheduled as she had better response with it previously. Added Gabapentin as she was on previously.         Consulted Psychiatry for depression, chronic pain and recommended SNRIs to help with pain and depression.        She want to try therapy first.        4. Thrombocytosis. uncertain etiology- follow closely. Likely reactive.    5. Cachexia-   continue TPN, recheck PAB. Albumin stays less than 1.5.    6. Sinus tachycardia. Likely related to pain. Monitor.    7. Ischemic feet/arterial thromboemboli- The patient had a clot in her femoral artery right leg that was cleared by vascular surgery.  However the patient ended up with necrosis at her distal digits which may be a combination of lack of blood flow and the use of pressors. Refusing heparin, aspirin and Plavix. Refused all heparin injections previously as well. Canceled aspirin but continue to encourage Plavix..           Following podiatrist at [facility].    8. History of colon cancer s/p exenteration with bilateral nephrostomy tubes    9. Recurrent presacral abscesses. Currently on broad spectrum IV Abx. ID following.    10. Debility- PT/OT    11. Guarded prognosis.     12. On TPN. 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 Cant tolerate PO. Weekly LFT while on TPN. TPN being managed by pharmacy. Electrolytes replacement through TPN. LFT are WNL so far.    A/p reviewed and updated.  Labs reviewed.  Will watch her vitals carefully. Dilaudid PRN only for severe pain.      Prophylaxis;  GI Prophylaxis: Pepcid  DVT Prophylaxis: Lovenox     Code Status: Full Code      Taina Landry MD.       08/08/2015

## 2015-08-08 NOTE — Unmapped (Signed)
Problem: Discharge Planning  Goal: Patient???s discharge needs are met  Collaborate with interdisciplinary team and initiate plans and interventions as needed.   Outcome: Progressing  Ongoing discharge planning    Comments:   The case management department provided an update regarding patient???s plan of care today during Interdisciplinary Rounds.    With: Therapy, WCT, Nursing, Dietician, SW, RNCM    WCT reports patient helps with wound care - they were able to modify pouches to smaller versions.  Continues TPN - weight is down to 85 pounds. - TPN will be adjusted. Getting up with therapy 1 - 2 times a week.  - needs to increase mobility.  She is NWB for 2 weeks.  CM will arrange a care conference with family.    Discharge recommendation is to SNF    Barriers to discharge at this time include: IV medications, TPN, extensive wound care

## 2015-08-08 NOTE — Unmapped (Signed)
Clinical Pharmacy Note - TPN Order    Linda Ponce is a 45 y.o. female that is being followed by the clinical pharmacy department for monitoring and adjustment of TPN.    Patient continues NPO with ice chips, sips of clear liquid.    Plan:    -routine TPN labs not available today for review  -any adjustments needed to TPN will be made tomorrow  -continue current continuous TPN-no changes  -formulation listed below    TPN Electrolytes  ??Sodium Chloride: 80 mEq  ??Sodium Acetate: 0 mEq  ??Sodium Phosphate: 0 mmol  ??Magnesium Sulfate: 16 mEq  ??Calcium Gluconate: 9.6 mEq  ??Potassium Chloride: 20 mEq  ??Potassium Acetate: 0 mEq  ??Potassium Phosphate: 15 mmol?????????????? TPN Macronutrients  ??Volume: 1675 mL  ??Rate: 69.8 mL/hr  ??Amino Acids: 47.7 g/L ??  ??Dextrose: 175 g/L ??  ??Lipids: 21 g/L ?????????????? TPN Additives  ??MVI 10 mL  ??TE 1 mL  ??Ascorbic Acid 200mg  ??????????????   ??                                      Linda Ponce Linda Ponce [facility] 08/08/2015 8:13 AM

## 2015-08-08 NOTE — Unmapped (Signed)
Clinical Pharmacy Note    Linda Ponce is a 45 y.o. female that is being followed by the clinical pharmacy department for monitoring and adjustment of vancomycin.       ??Indication for treatment: peritoneal abscess  Goal trough: 10-15 mg/L  Anticipated stop date: TBD  Other antimicrobials: Piperacillin-tazobactam 3.375 g IV q8h extended infusion  ?????????????????????????????????????????????????????????????????????????? Fluconazole 200 mg IV q24h    Plan:    -change Vancomycin to 750 mg q12h as trough lower than desired  -renal clearance stable, patient afebrile, patient weight on downward trend  -next scheduled trough 4/29 at 0830      CREATININE   Date Value Ref Range Status   08/08/2015 0.54* 0.60 - 1.30 mg/dL Final   16/01/9603 5.40* 0.60 - 1.30 mg/dL Final   98/02/9146 8.29* 0.60 - 1.30 mg/dL Final   56/21/3086 5.78* 0.60 - 1.30 mg/dL Final   46/96/2952 8.41* 0.60 - 1.30 mg/dL Final   32/44/0102 7.25* 0.60 - 1.30 mg/dL Final     WBC   Date Value Ref Range Status   08/05/2015 6.6 3.8 - 10.8 10E3/uL Final   08/01/2015 8.3 3.8 - 10.8 10E3/uL Final   07/31/2015 7.0 3.8 - 10.8 10E3/uL Final   07/29/2015 9.1 3.8 - 10.8 10E3/uL Final        VANCOMYCIN TR   Date Value Ref Range Status   08/07/2015 9.1* 10.0 - 20.0 ug/mL Final        Carlos Quackenbush E Hashim Eichhorst [facility] 08/08/2015 2:41 PM

## 2015-08-08 NOTE — Unmapped (Signed)
Fentanyl patch off per previous nurse phar aware new orders

## 2015-08-08 NOTE — Unmapped (Signed)
[place]    [name]   Wound Care Assessment  08/08/2015       Linda Ponce is an 45 y.o. female.  DOB: 07-Aug-1970    Pressure ulcers on admission:  One STAGE: III on coccyx              One UNSTAGEABLE on right lateral hip    Diagnosis: jewish 07/05/15 dx: sepsis    Allergies   Allergen Reactions   ??? Compazine [Prochlorperazine Edisylate]    ??? Latex, Natural Rubber        History:    Past Medical History   Diagnosis Date   ??? Cancer    ??? Abscess of abdominal cavity    ??? Gangrene of foot    ??? AKI (acute kidney injury)    ??? Fistula    ??? Peritonitis and retroperitoneal infections    ??? Cachexia    ??? Hyperkalemia    ??? Dehydration    ??? Encephalopathies    ??? Ischemic foot    ??? 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 Dilaudid IV q3h prn reduces it to 5/10 but does not last.  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 No hist     Past Surgical History   Procedure Laterality Date   ??? Colon surgery       Social History   Substance Use Topics   ??? Smoking status: Never Smoker    ??? Smokeless tobacco: None   ??? Alcohol Use: None     Present admission history of illness/MD note.  88f with history of colorectal CA, initially admitted to [facility] post operatively for continued wound care. Developed bleeding, sent back to [facility] where she was foundto have multiple pelvic abscesses, enterocutaneous fistulas, was kept NPO, started on IV abx, TPN, and returns for continued care.    Skin/Wound Care Assessment.    WCT changed small Eakin pouch to left abdomen fistula due to leakage. Patient does like use of smaller pouch, declined going back to using larger pouch even if got better wear time. Used extra [employer] and Hy tape to help maintain seal.  Mid  abdomen fistula pouch reinforced at inferior end of pouch, appears that colostomy is functioning some liquid brown effluent, colostomy leaking, changed this pouch. Patient assisted with changing of pouches. Came back to room with therapy in order to assess coccyx wound and nephrostomy tubes. Patient gets anxious at times during care, very stiff, difficult to turn patient without assistance.   Patient was premedicated,does c/o discomfort with care, takes long time for patient to remove and clean areas and then have pouches reapplied. Does become very anxious with care and tearful. Patient does help but does not appear that she could do entire pouch change without assistance. Due to her anxiety requires additional time for care.     Braden: 18     Pressure wounds:      Coccyx STAGE:   III  Measures:  L 0.8 cm X W 0.5 cm X D  0.01 cm.   Wounds progress: No significant chante  Wound Assessment:  Continues with very small superficial area of open moist pink tissue,  no drainage, no odor noted. Edges flat, attached. Periwound skin  intact.   Treatment/Dressing: Clean with normal saline, pat dry. Apply fibracol to wound bed and cover with Mepilex border. Change every other day and prn dressing failure.        _____________________________________________________________________  Surgical wounds:??????  ??????  Bilateral nephrostomy drains,    Wounds progress:??UTA  Wound Assessment:Dsgs intact. Bilateral tubes to closed system drainage bag, gold liquid in drainage bags. Pt declined dressing change to either site.  Treatment/Dressing: Bilateral nephrostomy tubes both to drainage pouches. Dressing changes every 3 days and prn for dressing failure. Clean sites with CHG, allow to dry. Apply AMD slit drain gauze to site, cover with Tegaderm. Position tubing so that patient is not lying on tubing.      ____________________________________________________________________      Treatment time: 1 WCT nurses 1 hour??????????    Preventative  measures.    Off loading and pressure relief as measures are using an accumax mattress, Turning Wedge, pillows, and waffle boots to aid in off load of pressure points.  Other measures include lotion's and cream's to Hydrate and moisturize dry skin.  Moisture barrier cream Crit[employer] clear is provided to protect groin and peri anal area.. Currently has foley catheter with statlock in place.         Labs:   Lab Results   Component Value Date    GLUCOSE 97 08/08/2015    BUN 27* 08/08/2015    CO2 26 08/08/2015    CREATININE 0.54* 08/08/2015    K 4.2 08/08/2015    NA 139 08/08/2015    CL 107 08/08/2015    CALCIUM 8.2* 08/08/2015     Lab Results   Component Value Date    WBC 6.6 08/05/2015    HGB 8.1* 08/05/2015    HCT 24.5* 08/05/2015    MCV 93.6 08/05/2015    PLT 576* 08/05/2015     No results found for: PTT, INR  Lab Results   Component Value Date    PREALBUMIN 16.0* 07/09/2015     Lab Results   Component Value Date    ALBUMIN <1.5* 08/08/2015     Lab Results   Component Value Date    ESR 15 08/04/2015    CRP 96.9* 08/04/2015

## 2015-08-08 NOTE — Unmapped (Signed)
Assessment completed call light within reach change in pain maagement continues iv atb

## 2015-08-08 NOTE — Unmapped (Signed)
Problem: Knowledge Deficit  Goal: Patient/family/caregiver demonstrates understanding of disease process, treatment plan, medications, and discharge instructions  Complete learning assessment and assess knowledge base.   Explain pain management

## 2015-08-09 LAB — POC GLU MONITORING DEVICE: POC Glucose Monitoring Device: 115 mg/dL — ABNORMAL HIGH (ref 70–100)

## 2015-08-09 MED ORDER — TPN ADULT (WCH/Drake)
10 | INTRAVENOUS | Status: AC
Start: 2015-08-09 — End: 2015-08-10
  Administered 2015-08-09: 21:00:00 via INTRAVENOUS

## 2015-08-09 MED FILL — FAMOTIDINE (PF) 20 MG/2 ML INTRAVENOUS SOLUTION: 20 20 mg/2 mL | INTRAVENOUS | Qty: 2

## 2015-08-09 MED FILL — PIPERACILLIN-TAZOBACTAM 3.375 GRAM INTRAVENOUS SOLUTION: 3.375 3.375 gram | INTRAVENOUS | Qty: 1

## 2015-08-09 MED FILL — GABAPENTIN 250 MG/5 ML ORAL SOLUTION: 50 50 mg/mL | ORAL | Qty: 5

## 2015-08-09 MED FILL — CLOPIDOGREL 75 MG TABLET: 75 75 mg | ORAL | Qty: 1

## 2015-08-09 MED FILL — HYDROMORPHONE 2 MG/ML INJECTION SYRINGE: 2 2 mg/mL | INTRAMUSCULAR | Qty: 2

## 2015-08-09 MED FILL — OXYCODONE 5 MG TABLET: 5 5 MG | ORAL | Qty: 2

## 2015-08-09 MED FILL — VANCOMYCIN 1,000 MG INTRAVENOUS INJECTION: 1000 1000 mg | INTRAVENOUS | Qty: 750

## 2015-08-09 MED FILL — TRAVASOL 10 % INTRAVENOUS SOLUTION: 10 10 % | INTRAVENOUS | Qty: 799

## 2015-08-09 MED FILL — FLUCONAZOLE 200 MG/100 ML IN SOD. CHLORIDE (ISO) INTRAVENOUS PIGGYBACK: 200 200 mg/100 mL | INTRAVENOUS | Qty: 100

## 2015-08-09 MED FILL — ONDANSETRON HCL (PF) 4 MG/2 ML INJECTION SOLUTION: 4 4 mg/2 mL | INTRAMUSCULAR | Qty: 2

## 2015-08-09 NOTE — Unmapped (Signed)
Hospitalist Progress Note    08/09/2015     Linda Ponce   LOS: 32 days       HPI;    Patient is s/p OR on 05/31/2015 with [facility] U[facility] for incision and drainage of complex abdominal wall abscess, rectal EUA, cystourethroscopy, bilateral retrograde pyelograms and removal old stent and placement of left 6x24 firm stent and right ureter embolization, right nephrostomy tube exchange, left nephrostomy tube placement and pelvic drain exchange on 06/06/2015 at a hospital in [place]. She was admitted to [facility] with severe sepsis secondary to peritonitis with stool coming from multiple abdominal wounds. She was seen by Critical Care, general surgery, and ID. General surgery recommended NPO and bowel rest and TPN has been started. She is receiving continued dressing changes and nephrostomy tube care. In addition, she was noted to have gangrene of the right foot and Dr. Donzetta Sprung performed angiogram, thrombectomy, and EIA stent on 06/27/15. Dr. Simeon Craft has evaluated and will follow as outpatient to consider transmetatarsal amputation at a later date. Patient is hemodynamically stable. She was  to [facility] and will need continued wound care, IV Vanc and Zosyn, and TPN.  She was sent back to [facility] on 07/05/2015  with abdominal pain and leukocytosis.CT scan showed multiple abdominal abscesses with large pelvic abscess . Her Hgb was 6.8 in ED and she was transfused 1 unit of blood.     Subjective & Interval History;      No fever, chills, SOB, CP, N/V.      Review of Systems:   as above    PMH: Unchanged from H&P.   PSH: Unchanged from H&P.   Family medical history: Unchanged from H&P.   Social history: Unchanged from H&P      Scheduled Meds:  ??? clopidogrel  75 mg Per NG tube Daily 0900   ??? enoxaparin  40 mg Subcutaneous Daily 0900   ??? famotidine (PF)  20 mg Intravenous BID   ??? fentaNYL  1 patch Transdermal Q72H   ??? fluconazole in NaCl (iso-osm)  200 mg Intravenous Q24H   ??? gabapentin  125 mg Oral 3  times per day   ??? insulin regular  0-5 Units Subcutaneous Daily 0900   ??? oxyCODONE  10 mg Oral 6x Daily   ??? piperacillin-tazobactam (ZOSYN) IV extended interval  3.375 g Intravenous Q8H   ??? vancomycin  750 mg Intravenous Q12H     Continuous Infusions:  ??? TPN ADULT ([facility]) 69.8 mL/hr at 08/08/15 1728   ??? TPN ADULT ([facility])       PRN Meds:ALPRAZolam, alteplase, dextrose, dextrose 50 % in water (D50W), diclofenac sodium, heparin lock flush, HYDROmorphone, ipratropium-albuterol, LORazepam, ondansetron    Objective:    Vital signs in last 24 hours:  Temp:  [97.2 ??F (36.2 ??C)-97.5 ??F (36.4 ??C)] 97.5 ??F (36.4 ??C)  Heart Rate:  [110-117] 117  Resp:  [18-19] 19  BP: (97-108)/(42-75) 108/75 mmHg    Physical Examination:    GENERAL: The patient is awake,not in acute cardiorespiratory distress. Looking chronically sick.  HEENT:Atraumatic.Normocephalic. PERRLA, EOMI. No icterus, no pallor, no jugular venous distention..No ear or nose discharge.  NECK: Supple. Trachea is in midline. No Thyromegaly  CHEST: Symmetric. Clear to auscultation, bilateral equal air entry. No wheezing or rhonchi.  CARDIOVASCULAR: S1, S2, Regular pulse. No murmur or gallops. No JVD.  ABDOMEN: Whole anterior abdomen is covered with pouch. Brownish fistula output noted.Bilateral nephrostomy tubes.  EXTREMITIES: There is + BLE edema . Very  tender to touch both legs.  Right toes are gangrenous.   Skin;No bruise,No rash.No petechiae.  CNS; CN II-XII grossly intact. No focal neurological deficit.       Intake/Output last 3 shifts:  I/O last 3 completed shifts:  In: 2589 [P.O.:118; I.V.:1591; IV Piggyback:200]  Out: 1500 [Urine:1500]    Recent Labs;                           Lab name 07/31/15  0519 08/01/15  0621 08/05/15  0351   WBC 7.0 8.3 6.6   HEMOGLOBIN 9.0* 8.9* 8.1*   HEMATOCRIT 27.6* 27.0* 24.5*   MEAN CORPUSCULAR VOLUME 92.3 91.9 93.6   PLATELETS 562* 568* 576*                                                            Lab name 08/01/15  0621  08/05/15  0351 08/08/15  0950   SODIUM 140   140 142 139   POTASSIUM 4.3   4.3 4.0 4.2   CREATININE 0.42*   0.42* 0.44* 0.54*   BUN 28*   28* 24 27*   CHLORIDE 108   108 109 107   CO2 26   26 26 26    PHOSPHORUS 3.6   3.6 3.9 4.1   MAGNESIUM 1.8 1.7 1.8                         Lab name 07/22/15  0604 07/26/15  0746 07/29/15  0409 08/05/15  0351   AST 22 22 22 17    ALT 29 21 22 16    BILIRUBIN TOTAL 0.3 0.3 0.3 0.2       Lab name 07/05/15  0702  07/09/15  0544  08/01/15  0621 08/05/15  0351 08/08/15  0950   PREALBUMIN 14.0*  --  16.0*  --   --   --   --    ALBUMIN  --   < > 1.5*  < > <1.5* <1.5*   <1.5* <1.5*   < > = values in this interval not displayed.                                                                                                   Lab name 08/05/15  0351 08/08/15  0950   CALCIUM 7.7* 8.2*   PHOSPHORUS 3.9 4.1                              Lab name 07/29/15  0409 08/05/15  0351   TRIGLYCERIDES 114 107                        Lab name 08/01/15  0830 08/05/15  0900 08/07/15  0850   VANCOMYCIN TROUGH 10.6 9.3*  9.1*       Diet;    Diet Orders          TPN ADULT ([facility]) at 69.8 mL/hr starting at 04/28 1800    TPN ADULT ([facility]) at 69.8 mL/hr starting at 04/27 1800    Diet NPO effective now Except for: except ice chips, except sips of clears starting at 03/28 3244          Future Appointments:  No future appointments.    Assessment/Plan:      1. Peritoneal abscess- Mixed flora, pigtail drain 06/26/15 Refused followup CT, still requires one. Vanco/Zosyn/Diflucan.           Will complete 6 weeks IV therapy - projected stop 5/1 as per ID notes.    2. Enterocutaneous Fistula- wound care, bowel rest. Still has significant output.    3. Severe intractable acute on chronic pain syndrome- receiving Dilaudid IV every 3 hr  Prn,Fentanyl patch decreased to 75 from 125  mcg. Started PO Oxycodone scheduled as she had better response with it previously. Added Gabapentin as she was on previously.         Consulted  Psychiatry for depression, chronic pain and recommended SNRIs to help with pain and depression.        She want to try therapy first.        4. Thrombocytosis. uncertain etiology- follow closely. Likely reactive.    5. Cachexia-   continue TPN, recheck PAB. Albumin stays less than 1.5.    6. Sinus tachycardia. Likely related to pain. Monitor.    7. Ischemic feet/arterial thromboemboli- The patient had a clot in her femoral artery right leg that was cleared by vascular surgery.  However the patient ended up with necrosis at her distal digits which may be a combination of lack of blood flow and the use of pressors. Refusing heparin, aspirin and Plavix. Refused all heparin injections previously as well. Canceled aspirin but continue to encourage Plavix..           Following podiatrist at [facility].    8. History of colon cancer s/p exenteration with bilateral nephrostomy tubes    9. Recurrent presacral abscesses. Currently on broad spectrum IV Abx. ID following.    10. Debility- PT/OT    11. Guarded prognosis.     12. On TPN. 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 Cant tolerate PO. Weekly LFT while on TPN. TPN being managed by pharmacy. Electrolytes replacement through TPN. LFT are WNL so far.    A/p reviewed and updated.  Labs reviewed.  Will watch her vitals carefully. Dilaudid PRN only for severe pain.      Prophylaxis;  GI Prophylaxis: Pepcid  DVT Prophylaxis: Lovenox     Code Status: Full Code      Princess Bruins Mickle Plumb MD.       08/09/2015

## 2015-08-09 NOTE — Unmapped (Signed)
Pharmacy Nutrition Support Service    Karon Cotterill is a 45 y.o. female on TPN.  Pharmacy was consulted for monitoring and adjustment.    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    Pt Weights:  40.8 kg (08/03/15)    38.7 kg (07/31/15)    39.8 kg (07/27/15)    42.8 kg (07/21/15)    Estimated Nutritional Needs: Per Dietary on 07/09/15   Based on Weight 45.6 kg   Amino Acids: 55-65 grams (1.2-1.4 grams/kg/day)   Fluid: 1550 mL/day   Total Daily Calories: 1500-1565 kcal (33-34 kcal/kg/day)    Current TPN Formula (further increased on 4/21)   Based on current weight of 40.8 kg   Amino Acids: 47.7 g/L (80 grams, 1.96 grams/kg/day, 320 kcal)   Dextrose: 175 g/L (293 grams, 997 kcal)   Lipids: 21 g/L (35.2 grams, 352 kcal)   Total Daily Calories: 1669 kcal (41 kcal/kg/day)   Volume: 1675 mL   Rate: 69.8 mL/h    TPN Medication Recent History (Show up to 1 orders; newest on the left.)     Start date and time   08/08/2015 1800      TPN ADULT ([facility]) [[id number]]    Order Status  Active    Last Given  08/08/2015 1728       Macro Ingredients    amino acid 10%  47.7 g/L    dextrose 70%  175 g/L       QS Base    sterile water  268.75 mL       Lipids    fat emulsion 30 %  21 g/L       TPN Electrolytes entered as PER DAY amount    sodium chloride  80 mEq    magnesium sulfate  16 mEq    calcium gluconate  9.6 mEq    potassium chloride  20 mEq    potassium phosphate  15 mmol       TPN Additives entered as PER DAY amount    MVI adult no. 1 with vitamin K  10 mL    ascorbic acid (vitamin C)  200 mg    trace elements (MULTITRACE-5)  1 mL       Calorie Contribution    Proteins  319.6 kcal    Dextrose  997.22 kcal    Lipids  351.9 kcal    Total  1,668.72 kcal       Electrolyte Ion Calculated Amount    Sodium  80 mEq    Potassium  42 mEq    Calcium  9.6 mEq    Magnesium  15.83 mEq    Aluminum  --    Phosphate  15 mmol    Chloride  100 mEq    Acetate  --       Other    Total Protein  79.9 g    Total Protein/kg   1.96 g/kg    Glucose Infusion Rate  4.99 mg/kg/min    Osmolarity  1,542.65    Volume  1,675 mL    Rate  69.8 mL/hr    Dosing Weight  40.8 kg    Infusion Site  Central        Diet Orders          TPN ADULT ([facility]) at 69.8 mL/hr starting at 04/27 1800    Diet NPO effective now Except for: except ice chips, except sips of clears starting at 03/28 [phone]  TPN Associated Medications:    - GI Prophylaxis: famotidine   - Antiemetic: ondansetron PRN   - Bowel Regimen: none   - Insulin Regimen: sliding scale regular insulin     Recent Labs:      Lab 08/08/15  0950 08/05/15  0351   SODIUM 139 142   POTASSIUM 4.2 4.0   CHLORIDE 107 109   CO2 26 26   BUN 27* 24   CREATININE 0.54* 0.44*   GLUCOSE 97 91   CALCIUM 8.2* 7.7*   PHOSPHORUS 4.1 3.9   MAGNESIUM 1.8 1.7   Corrected Calcium >= 10.2 (albumin < 1.5)      Lab 08/08/15  0950 08/05/15  0351   ALK PHOS  --  137*   ALT  --  16   AST  --  17   BILIRUBIN TOTAL  --  0.2   BILIRUBIN DIRECT  --  0.04   TOTAL PROTEIN  --  4.3*   ALBUMIN <1.5* <1.5*   <1.5*         Lab 08/05/15  0351   TRIGLYCERIDES 107         Assessment and Plan:    - Macronutrients: formulation was increased on 4/17 and again on 4/21 as per dietary's recommendations.  - Electrolytes: reduce calcium in TPN due to borderline high corrected calcium level.  - Multivitamin/Trace Elements: provided daily in TPN.   - Glucose: has been WNL, no sliding scale insulin doses required since 4/14.  - Other additives: ascorbic acid 200 mg added to each bag considering GI output and wound healing.  - Check labs twice weekly: Monday/Thursday.    Thank you for the consult.    Kristie Cowman, PharmD  08/09/2015 7:51 AM

## 2015-08-09 NOTE — Unmapped (Signed)
Problem: Fall Prevention  Goal: Patient will remain free of falls  Assess and monitor vitals signs, neurological status including level of consciousness and orientation. Reassess fall risk per hospital policy.    Ensure arm band on, uncluttered walking paths in room, adequate room lighting, call light and overbed table within reach, bed in low position, wheels locked, side rails up per policy, and non-skid footwear provided.   Outcome: Progressing

## 2015-08-09 NOTE — Unmapped (Signed)
Problem: Potential for Compromised Skin Integrity  Goal: Skin integrity is maintained or improved  Assess and monitor skin integrity. Identify patients at risk for skin breakdown on admission and per policy. Collaborate with interdisciplinary team and initiate plans and interventions as needed.   Outcome: Progressing

## 2015-08-09 NOTE — Unmapped (Signed)
Pt refused fingerstick for glucose check

## 2015-08-09 NOTE — Unmapped (Signed)
Spoke with sister April Moon - care conference arranged for 5/3 @ 2:30 pm

## 2015-08-09 NOTE — Unmapped (Signed)
[facility] Inpatient Medical Psychology Service  [facility] Neurology & Rehabilitation Medicine  Division of Neuropsychology & Medical Psychology     INPATIENT MEDICAL PSYCHOLOGY PROGRESS NOTE    Patient Name:  Linda Ponce          DOB:  11/29/1970  Admit Date:  07/08/2015   CSN:  [id number]    Referring Provider:  Valda Lamb, MD  Location:  [name] Room N319/DN319-02     Current Date:  08/09/2015  Time and Duration of Service:  6:50p-8:10p = 80 minutes face to face time     CPT Code:  09811 - Health & Behavior Intervention - Individual - 5 units, 80 minutes face to face time    Current Problem List:      Patient Active Problem List   Diagnosis   ??? Abdominal abscess   ??? Enterocutaneous fistula   ??? Malnutrition   ??? Fistula   ??? Adjustment disorder with mixed anxiety and depressed mood   ??? Pain of multiple sites   ??? Cognitive dysfunction-recent onset.       Current Medications:     Scheduled Medications:    ??? clopidogrel  75 mg Per NG tube Daily 0900   ??? enoxaparin  40 mg Subcutaneous Daily 0900   ??? famotidine (PF)  20 mg Intravenous BID   ??? fentaNYL  1 patch Transdermal Q72H   ??? fluconazole in NaCl (iso-osm)  200 mg Intravenous Q24H   ??? gabapentin  125 mg Oral 3 times per day   ??? insulin regular  0-5 Units Subcutaneous Daily 0900   ??? oxyCODONE  10 mg Oral 6x Daily   ??? piperacillin-tazobactam (ZOSYN) IV extended interval  3.375 g Intravenous Q8H   ??? vancomycin  750 mg Intravenous Q12H     PRN Medications:  ALPRAZolam, alteplase, dextrose, dextrose 50 % in water (D50W), diclofenac sodium, heparin lock flush, HYDROmorphone, ipratropium-albuterol, LORazepam, ondansetron    Objective Presentation/Subjective Concerns:      ?? Appreciate reducing fentanyl patch from 125 mcg to 75 mcg, scheduling oxycodone 10 mg q4h, and adding neurontin 125 mg q8h liquid. Noted that dilaudid was also increased to 4 mg q3h prn.   ?? Pt was somewhat calmer today and reported  improved pain control (not great yet, but improved). However, she is feeling much more nauseous today; unclear if it is a possible narcotic withdrawal versus increased nausea due to increased dilaudid dose. Pt was unwilling to consider dilaudid as a culprit and trial a 3 mg option.   ?? She also c/o neurontin tasting very very bad, and was not taking it.   ?? Saw psychiatry; refused antidepressant. Doesn't perceive self as depressed but just normal for what I'm going through.     Psychiatric Diagnoses:      ?? Cognitive dysfunction--recent onset.    ?? Likely residual of significant delirium at [facility], pain medications, distraction from pain itself, and possibly IV ABX. Will monotor. Primarily involves orientation to time, and somewhat to place, short term recall, and working memory. Expected to improve as pt's wounds and infections improve.  ?? MMSE-2 score of 21/20 (T=21) indicates moderate cognitive impairment involving orientation, short term recall, and working memory.   ?? Clinical assessment of reasoning skills indicated normal limits reasoning skills except when her pain gets severe, at which point she becomes fearful and suspicious about medications being withheld because she bother's the nurses  too much.    ?? Pain of Multiple Sites.    ?? 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 Pt reports severe pain ranging from 8-10/10 even with fentanyl 125 mcg patch. Dilaudid IV q3h prn reduces it to 5/10 but does not last.  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 No history of narcotic abuse, but she has been on narcotics for abdominal and radiation pain from 2011 cancer surgery and complications x 6 yrs. Best regimen per pt post op at [facility] was oxycodone 15 mg q4h scheduled and dilaudid 1 mg q3h prn which she tapered down before discharge.She also took neurontin 300 mg TID when home prior to surgery for neuropathic pain (low back).  Psychosocial losses (death of father 1 week before surgery, missing teen son who is in [place] w/her mother) reduce ability to cope with pain and hospitalization.     ?? Adjustment disorder with mixed anxiety and depressed mood.  ?? Related to prolonged hospitalization and inadequately controlled pain in abdomen, back, and legs.  ?? She has had NO prior treatment for anxiety or depression or substance use disorders.    Intervention(s):    ?? Provided support and facilitated exploration of concerns.  ?? Reinforced improvements in pain control and mood.  ?? Educated the patient re:    ?? Possible withdrawal effects of reducing fentanyl, but unlikely given increase in other narcotics. Encouraged her to consider asking for reduced dilaudid dose but she declined.   ?? Extensive education about gate control theory of pain again, focusing more on cognition's impact on mood and therefore pain perception of intensity.   ?? Introduced idea of an antidepressant to reduce her emotional reactivity which would help keep that pain gates closed. She was not willing to consider it. Educated her about mechanism of action, and that it only makes one's own neurotransmitters more available. Is resistant.  ?? Problem-solved concerns about nausea; encourage reducing dilaudid again.       Recommendations to Attending MD:      ?? None at this time except to continue cross taper off on fentanyl with parallel increase in scheduled oxycodone. Pt reported doing well when oxycodone was 15 mg q4h during her last hospitalization.    Medical Psychology Follow-up Plan:    ?? I will follow up May 1 to continue facilitating reducing of narcotics overall, reporting her progress to MD.  ?? Will continue CBT pain management approach.      Kayliegh Boyers P. Minerva Ends, PhD  Licensed  Insurance risk surveyor in Medical Psychology  [place] [facility] of Neurology & Rehabilitation Medicine  Office Phone for non-urgent messages/requests:  [phone]  Urgent issues-pager: [phone]    08/09/2015

## 2015-08-09 NOTE — Unmapped (Signed)
Problem: Pain  Goal: LTG - Patient will manage pain with the appropriate technique/Intervention  Outcome: Not Progressing  Pt has intermittent pain, partially relieved by prn iv medication, pt refuses to take po medication at this time

## 2015-08-09 NOTE — Unmapped (Signed)
[name]   Wound Care Assessment  08/09/2015       Linda Ponce is an 45 y.o. female.  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 Compazine [Prochlorperazine Edisylate]    ??? Latex, Natural Rubber        History:    Past Medical History   Diagnosis Date   ??? Cancer    ??? Abscess of abdominal cavity    ??? Gangrene of foot    ??? AKI (acute kidney injury)    ??? Fistula    ??? Peritonitis and retroperitoneal infections    ??? Cachexia    ??? Hyperkalemia    ??? Dehydration    ??? Encephalopathies    ??? Ischemic foot    ??? 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 Dilaudid IV q3h prn reduces it to 5/10 but does not last.  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 No hist     Past Surgical History   Procedure Laterality Date   ??? Colon surgery       Social History   Substance Use Topics   ??? Smoking status: Never Smoker    ??? Smokeless tobacco: None   ??? Alcohol Use: None     Present admission history of illness/MD note.  44f with history of colorectal CA, initially admitted to [facility] post operatively for continued wound care. Developed bleeding, sent back to [facility] where she was foundto have multiple pelvic abscesses, enterocutaneous fistulas, was kept NPO, started on IV abx, TPN, and returns for continued care.    Skin/Wound Care Assessment.    WCT assessed mid abdomen fistula, left abdomen fistula, left groin drain, right lower abd colostomy, appliance changes done to all of these areas, with patient assisting with care.  Patient was premedicated,does c/o discomfort with care, takes  long time for patient to remove and clean areas and then have pouches reapplied. Does become very anxious with care and tearful. Patient does help but does not appear that she could do entire pouch change without assistance. Due to her anxiety requires additional time for care. Took 1 1/2 hours for pouch changes to the four areas listed. Wound care team deferred assessment for today of left foot, coccyx and bilateral nephrostomy tubes. Informed nurse Golda Acre of care that was provided and drsg changes not done today by [facility]. Will attempt to assess these areas within next 2 days.       Braden: 18     Pressure wounds:    Coccyx STAGE:???? III?? Measures:?? L 0.8 cm X W 0.5 cm X D?? 0.01 cm. ??*Not assessed today, was assessed on 4-27, see below*  Wounds progress: No significant change  Wound Assessment:?? Continues with very small superficial area of open moist pink tissue,?? no drainage, no odor noted. Edges flat, attached. Periwound skin intact. ??  Treatment/Dressing: Clean with normal saline, pat dry. Apply fibracol to wound bed and cover with Mepilex border. Change every  other day and prn dressing failure.      _____________________________________________________________________  Surgical wounds:????????????????????????????????????????????????????????????    Mid Abdominal Wound with fistula???? Full Thickness. Measurements: 9 cm L x 4.3 cm W x 1 cm D  *Measurements and picture done on 4-26, pouch changed today with assistance from patient due to leakage*  Wounds progress:??no significant change  Wound Assessment:?? Wound bed with moist bright pink tissue, granulation tissue with areas of mucosal/bowel tissue.  Well defined edges. Large amount liquid gold brown effluent draining into foley bag. Periwound skin clean and  intact. Pouch has stayed intact approximately 7 days wear time, just beginning to leak, appears to have leaked into small pouches of colostomy and  left groin drainage tube. .  Treatment/Dressing: Mid abdominal and left abdominal  fistula/wounds being managed with Eakins pouches attached to gravity drainage bags. Clean wounds and surrounding skin with soap and water, pat dry. Attach Eakins pouch using Eakins rings to help with adherence. Use pouch [id number] for mid abdomen and smaller pouch [phone] for left abdomen. Change pouches weekly and prn for leakages.      ____________________________________________________________________  Surgical wounds:??????  ??????  Left Lateral Abdomen Fistula???? Full Thickness. Measurements L 3.5?? cm X W 1 cm X?? D 1 cm  *Measurements and picture done on 4-26, pouch not presently leaking but reinforced today due to edges slightly lifting*  Wounds progress: improved, smaller in size  Wound Assessment:??Moist pink tissue, edges with granulation and epithelial tissue. Is smaller in size, has area at 6 oclock with divit/dimpling. Large amount liquid brown effluent draining into foley bag.?? Periwound skin clean and intact.?? This pouch has been intact for 5 days. Intact but is beginning to peel around edges??  Treatment/Dressing:?? Patient assisted with pouch removal and skin cleaning. Left abdominal fistula/wound being managed with Eakins pouch attach to gravity drainage bags. Clean wounds and surrounding skin with soap and water, pat dry. Attach Eakins pouch using Eakins rings to help with adherence. Used smaller pouch [id number] today  for left abdomen. Change pouches weekly and prn for leakages.       _____________________________________________________________________  Surgical wounds:????  ????????  Drain of left groin ?? ??*Picture and assessment done on 4-26, changed pouch today with assistance from patient because no longer sealed*  Wounds progress:????No change??  Wound Assessment: Drain intact, sutured in place.Had small amount of drainage in pouch but does not appear to be from tube, appears to have leaked from large mid abdomen fistula into this pouch which was starting to lift slightly at edges.. Peri-wound  clean/dry/intact  Treatment/Dressing: Drain to left groin is open, apply small Hollister pouch [phone] to this, change pouch weekly and prn dressing failure.      _____________________________________________________________________  Surgical wounds:??????  ??????  Bilateral nephrostomy drains,?? ??*Sites pictured and assessed on 4-27, see below*  Wounds progress:??UTA  Wound Assessment: Dsgs intact. Bilateral tubes to closed system drainage bag, gold liquid in drainage bags. Pt declined dressing change to either site.  Treatment/Dressing: Bilateral nephrostomy tubes both to drainage pouches. Dressing changes every 3 days and prn for dressing failure. Clean sites with CHG, allow to dry. Apply AMD slit drain gauze to site, cover with Tegaderm. Position tubing so that patient is not lying on tubing.      ____________________________________________________________________  Vascular Wounds on Right Foot????       Wounds progress:????stable, no significant change  Wound Assessment:?? Dry black eschar to dorsal portion and plantar tips of 1st through 5th toes, great toenail has fallen off.  Still with superficial open areas of moist pink tissue to plantar distal third of foot, edges loosely attached, small serosanguinous drainage, extremely painful per pt. Eschar has not extended beyond marked line.   Treatment/Dressing: Per Dr. Luan Pulling (pt had appt on 4/25) paint toes daily with betadine, keep dry and keep pressure off toes. Also per appt notes, place 2 layers of tubi-grip to left lower leg from toes to knees - explained this to pt but she refused to allow this nurse to apply.        _____________________________________________________________________  Surgical wounds:    Colostomy/mucsous fistula ????RLQ  *Picture and assessment done on 4-26, changed pouch today with assistance from patient due to leakage*  Wound Progress:  No change, stable   Wound Assessment:??Moist red budded stoma, peri-wound clean/dry/intact. Small amount of  mucous drainage from ostomy, small amount loose brown drainage in pouch (does not appear to be from the colostomy but from the mid abd fistula drainage d/t to both pouches lifting)  Wound Treatment: Right lower quadrant colostomy currently only functioning as mucous fistula. Keep covered with small ostomy pouch Hollister # [id number] or Myna Bright [phone]. Change weekly and prn dressing failure.     Changed pouch, using the smaller Myna Bright #[id number].     ______________________________________________________________    Treatment time: 1 nurse 2 hours, for right foot care and changing mid abdomen pouch, ostomy pouch, and left groin pouch????????????????    Preventative measures.    Off loading and pressure relief as measures are using an accumax mattress, Turning Wedge, pillows, and waffle boots to aid in off load of pressure points.  Other measures include lotion's and cream's to Hydrate and moisturize dry skin.  Moisture barrier cream [employer] clear is provided to protect groin and peri anal area.. Currently has foley catheter with statlock in place.         Labs:   Lab Results   Component Value Date    GLUCOSE 97 08/08/2015    BUN 27* 08/08/2015    CO2 26 08/08/2015    CREATININE 0.54* 08/08/2015    K 4.2 08/08/2015    NA 139 08/08/2015    CL 107 08/08/2015    CALCIUM 8.2* 08/08/2015     Lab Results   Component Value Date    WBC 6.6 08/05/2015    HGB 8.1* 08/05/2015    HCT 24.5* 08/05/2015    MCV 93.6 08/05/2015    PLT 576* 08/05/2015     No results found for: PTT, INR  Lab Results   Component Value Date    PREALBUMIN 16.0* 07/09/2015     Lab Results   Component Value Date    ALBUMIN <1.5* 08/08/2015     Lab Results   Component Value Date    ESR 15 08/04/2015    CRP 96.9* 08/04/2015

## 2015-08-09 NOTE — Unmapped (Signed)
Uneventful day all care delivered per poc.

## 2015-08-10 LAB — POC GLU MONITORING DEVICE
POC Glucose Monitoring Device: 106 mg/dL (ref 70–100)
POC Glucose Monitoring Device: 106 mg/dL (ref 70–100)
POC Glucose Monitoring Device: 101 mg/dL (ref 70–100)
POC Glucose Monitoring Device: 104 mg/dL (ref 70–100)

## 2015-08-10 LAB — VANCOMYCIN, TROUGH: Vancomycin Tr: 17.4 ug/mL (ref 10.0–20.0)

## 2015-08-10 MED ORDER — TPN ADULT (WCH/Drake)
10 | INTRAVENOUS | Status: AC
Start: 2015-08-10 — End: 2015-08-11
  Administered 2015-08-10: 22:00:00 via INTRAVENOUS

## 2015-08-10 MED FILL — TRAVASOL 10 % INTRAVENOUS SOLUTION: 10 10 % | INTRAVENOUS | Qty: 799

## 2015-08-10 MED FILL — OXYCODONE 5 MG TABLET: 5 5 MG | ORAL | Qty: 2

## 2015-08-10 MED FILL — VANCOMYCIN 1,000 MG INTRAVENOUS INJECTION: 1000 1000 mg | INTRAVENOUS | Qty: 750

## 2015-08-10 MED FILL — ONDANSETRON HCL (PF) 4 MG/2 ML INJECTION SOLUTION: 4 4 mg/2 mL | INTRAMUSCULAR | Qty: 2

## 2015-08-10 MED FILL — FLUCONAZOLE 200 MG/100 ML IN SOD. CHLORIDE (ISO) INTRAVENOUS PIGGYBACK: 200 200 mg/100 mL | INTRAVENOUS | Qty: 100

## 2015-08-10 MED FILL — GABAPENTIN 250 MG/5 ML ORAL SOLUTION: 50 50 mg/mL | ORAL | Qty: 5

## 2015-08-10 MED FILL — HYDROMORPHONE 2 MG/ML INJECTION SYRINGE: 2 2 mg/mL | INTRAMUSCULAR | Qty: 2

## 2015-08-10 MED FILL — PIPERACILLIN-TAZOBACTAM 3.375 GRAM INTRAVENOUS SOLUTION: 3.375 3.375 gram | INTRAVENOUS | Qty: 1

## 2015-08-10 MED FILL — CLOPIDOGREL 75 MG TABLET: 75 75 mg | ORAL | Qty: 1

## 2015-08-10 MED FILL — ENOXAPARIN 40 MG/0.4 ML SUBCUTANEOUS SYRINGE: 40 40 mg/0.4 mL | SUBCUTANEOUS | Qty: 0.4

## 2015-08-10 MED FILL — FAMOTIDINE (PF) 20 MG/2 ML INTRAVENOUS SOLUTION: 20 20 mg/2 mL | INTRAVENOUS | Qty: 2

## 2015-08-10 NOTE — Unmapped (Signed)
Problem: Pain  Goal: STG - Pain is manageable through therapies  Outcome: Progressing  Patient is c/o pain in BLE, abdomen and back. Rates pain 10/10 when prn pain medication is due q 3 hours. Pt agreeing to taked scheduled oxycodone as well. Patient has been repositioned and offered ice packs.  Tolerates repositioning fairly well.  Given information on pain relieving techniques, patient voices understanding and states that his pain is tolerable at this time.

## 2015-08-10 NOTE — Unmapped (Addendum)
Clinical Pharmacy Note:    S:   Pharmacy to dose vancomycin follow-up note   O:   Patient is a 44 y.o. female.    Wt Readings from Last 1 Encounters:   08/03/15 90 lb (40.824 kg)       VANCOMYCIN TR (ug/mL)   Date Value   08/10/2015 17.4   08/07/2015 9.1*   08/05/2015 9.3*      A:   Goal trough listed as 10-15; nevertheless, the estimated stop date per IDs note is 5/1.  Renal clearance remains stable.  I am going to continue the dose as written currently and schedule a follow-up trough for 5/2 in the event that the medication continues.  I see no reason to decrease the dose to get to 10-15 when the patient is indeed tolerating this dose well.  The dose I would change it to the patient cleared with a trough that wasn't at goal.  Q18H would probably be the most appropriate; however, that can lead to management/administration issues and given the relatively soon stop date, there is really no need to change the dose.    Additionally, this trough was drawn at 0830 (30 minutes before the next dose; however, the dose before it was given late at midnight.  Based on this knowledge, the actual, true trough, is more than likely within the prescribed range of 10-15.     P:   Continue vancomycin as written.  Pharmacy will continue to dose and monitor     Gwynne Edinger, RPh, PharmD  08/10/2015  3:15 PM

## 2015-08-10 NOTE — Unmapped (Signed)
Problem: Daily Care  Goal: Daily care needs are met  Assess and monitor ability to perform self care and identify potential discharge needs.   Outcome: Not Progressing  Patient is dependent on staff for ADLs. Independent activity encouraged. Skin integrity monitored per staff.

## 2015-08-10 NOTE — Unmapped (Addendum)
Clinical Pharmacy Note (TPN Dosing)    No labs to review today.      Reviewed chart for any dietary recommendations and other problems.    No changes to TPN    Linda Ponce E Arty Lantzy  08/11/2015  8:00 AM

## 2015-08-10 NOTE — Unmapped (Signed)
Pt alert and oriented x4, anxious and crying upon assessment this morning. Worries her IV pain medication will not be given q 3 hours as ordered. Pt reassured and medicated per order for pain. VSS.

## 2015-08-10 NOTE — Unmapped (Signed)
Resting with eyes closed, c/os stomach discomfort and unable to take oral medications this evening. Medicated with Zofran for stomach discomfort and also medicated with Dilaudid for pain.

## 2015-08-10 NOTE — Unmapped (Signed)
Hospitalist Progress Note    08/10/2015     Linda Ponce   LOS: 33 days       HPI;    Patient is s/p OR on 05/31/2015 with [facility] for incision and drainage of complex abdominal wall abscess, rectal EUA, cystourethroscopy, bilateral retrograde pyelograms and removal old stent and placement of left 6x24 firm stent and right ureter embolization, right nephrostomy tube exchange, left nephrostomy tube placement and pelvic drain exchange on 06/06/2015 at a hospital in [place]. She was admitted to [facility] with severe sepsis secondary to peritonitis with stool coming from multiple abdominal wounds. She was seen by Critical Care, general surgery, and ID. General surgery recommended NPO and bowel rest and TPN has been started. She is receiving continued dressing changes and nephrostomy tube care. In addition, she was noted to have gangrene of the right foot and Dr. Donzetta Sprung performed angiogram, thrombectomy, and EIA stent on 06/27/15. Dr. Simeon Craft has evaluated and will follow as outpatient to consider transmetatarsal amputation at a later date. Patient is hemodynamically stable. She was  to [facility] and will need continued wound care, IV Vanc and Zosyn, and TPN.  She was sent back to [facility] on 07/05/2015  with abdominal pain and leukocytosis.CT scan showed multiple abdominal abscesses with large pelvic abscess . Her Hgb was 6.8 in ED and she was transfused 1 unit of blood.     Subjective & Interval History;      Looking withdrawn today. States that pain seem under better control now.  No fer or chills.  Not looking in acute cardiorespiratory distress.  Still has lot of output from [facility] fistula.  Right foot hurting and very sensitive to touch, sensitive even to bedsheet rubs.  10 point ROS was negative except as above.       Review of Systems:   as above    PMH: Unchanged from H&P.   PSH: Unchanged from H&P.   Family medical history: Unchanged from H&P.   Social history: Unchanged from  H&P      Scheduled Meds:  ??? clopidogrel  75 mg Per NG tube Daily 0900   ??? enoxaparin  40 mg Subcutaneous Daily 0900   ??? famotidine (PF)  20 mg Intravenous BID   ??? fentaNYL  1 patch Transdermal Q72H   ??? fluconazole in NaCl (iso-osm)  200 mg Intravenous Q24H   ??? gabapentin  125 mg Oral 3 times per day   ??? insulin regular  0-5 Units Subcutaneous Daily 0900   ??? oxyCODONE  10 mg Oral 6x Daily   ??? piperacillin-tazobactam (ZOSYN) IV extended interval  3.375 g Intravenous Q8H   ??? vancomycin  750 mg Intravenous Q12H     Continuous Infusions:  ??? TPN ADULT ([facility]) 69.8 mL/hr at 08/10/15 0456   ??? TPN ADULT ([facility])       PRN Meds:ALPRAZolam, alteplase, dextrose, dextrose 50 % in water (D50W), diclofenac sodium, heparin lock flush, HYDROmorphone, ipratropium-albuterol, LORazepam, ondansetron    Objective:    Vital signs in last 24 hours:  Temp:  [98.1 ??F (36.7 ??C)-99.7 ??F (37.6 ??C)] 99.7 ??F (37.6 ??C)  Heart Rate:  [104-114] 104  Resp:  [20-22] 22  BP: (93-103)/(54-64) 103/64 mmHg    Physical Examination:    GENERAL: The patient is awake,not in acute cardiorespiratory distress. Looking chronically sick.  HEENT:Atraumatic.Normocephalic. PERRLA, EOMI. No icterus, no pallor, no jugular venous distention..No ear or nose discharge.  NECK: Supple. Trachea is in midline. No Thyromegaly  CHEST: Symmetric. Clear to auscultation, bilateral equal air entry. No wheezing or rhonchi.  CARDIOVASCULAR: S1, S2, Regular pulse. No murmur or gallops. No JVD.  ABDOMEN: Whole anterior abdomen is covered with pouch. Brownish fistula output noted.Bilateral nephrostomy tubes.  EXTREMITIES: There is + BLE edema . Very tender to touch both legs.  Right toes are gangrenous.   Skin;No bruise,No rash.No petechiae.  CNS; CN II-XII grossly intact. No focal neurological deficit.       Intake/Output last 3 shifts:  I/O last 3 completed shifts:  In: 3692.8 [P.O.:236; I.V.:854; IV Piggyback:262]  Out: 3900 [Urine:2300; Drains:1600]    Recent Labs;                            Lab name 07/31/15  0519 08/01/15  0621 08/05/15  0351   WBC 7.0 8.3 6.6   HEMOGLOBIN 9.0* 8.9* 8.1*   HEMATOCRIT 27.6* 27.0* 24.5*   MEAN CORPUSCULAR VOLUME 92.3 91.9 93.6   PLATELETS 562* 568* 576*                                                            Lab name 08/01/15  0621 08/05/15  0351 08/08/15  0950   SODIUM 140   140 142 139   POTASSIUM 4.3   4.3 4.0 4.2   CREATININE 0.42*   0.42* 0.44* 0.54*   BUN 28*   28* 24 27*   CHLORIDE 108   108 109 107   CO2 26   26 26 26    PHOSPHORUS 3.6   3.6 3.9 4.1   MAGNESIUM 1.8 1.7 1.8                         Lab name 07/22/15  0604 07/26/15  0746 07/29/15  0409 08/05/15  0351   AST 22 22 22 17    ALT 29 21 22 16    BILIRUBIN TOTAL 0.3 0.3 0.3 0.2       Lab name 07/05/15  0702  07/09/15  0544  08/01/15  0621 08/05/15  0351 08/08/15  0950   PREALBUMIN 14.0*  --  16.0*  --   --   --   --    ALBUMIN  --   < > 1.5*  < > <1.5* <1.5*   <1.5* <1.5*   < > = values in this interval not displayed.                                                                                                   Lab name 08/05/15  0351 08/08/15  0950   CALCIUM 7.7* 8.2*   PHOSPHORUS 3.9 4.1                              Lab name 07/29/15  0409 08/05/15  0351   TRIGLYCERIDES 114 107                        Lab name 08/01/15  0830 08/05/15  0900 08/07/15  0850   VANCOMYCIN TROUGH 10.6 9.3* 9.1*       Diet;    Diet Orders          TPN ADULT ([facility]) at 69.8 mL/hr starting at 04/29 1800    TPN ADULT ([facility]) at 69.8 mL/hr starting at 04/28 1800    Diet NPO effective now Except for: except ice chips, except sips of clears starting at 03/28 4540          Future Appointments:  No future appointments.    Assessment/Plan:      1. Peritoneal abscess- Mixed flora, pigtail drain 06/26/15 Refused followup CT, still requires one. Vanco/Zosyn/Diflucan.           Will complete 6 weeks IV therapy - projected stop 5/1 as per ID notes.    2. Enterocutaneous Fistula- wound care, bowel rest. Still has  significant output.    3. Severe intractable acute on chronic pain syndrome- receiving Dilaudid IV every 3 hr  Prn,Fentanyl patch decreased to 75 from 125  mcg. Started PO Oxycodone scheduled as she had better response with it previously. Added Gabapentin as she was on previously.         Consulted Psychiatry for depression, chronic pain and recommended SNRIs to help with pain and depression.She want to try therapy first.      4. Thrombocytosis. uncertain etiology- follow closely. Likely reactive.    5. Cachexia-   continue TPN, recheck PAB. Albumin stays less than 1.5.    6. Sinus tachycardia. Likely related to pain. Monitor.    7. Ischemic feet/arterial thromboemboli- The patient had a clot in her femoral artery right leg that was cleared by vascular surgery.  However the patient ended up with necrosis at her distal digits which may be a combination of lack of blood flow and the use of pressors. Refusing heparin, aspirin and Plavix. Refused all heparin injections previously as well. Canceled aspirin but continue to encourage Plavix..           Following podiatrist at [facility]    8. History of colon cancer s/p exenteration with bilateral nephrostomy tubes    9. Recurrent presacral abscesses. Currently on broad spectrum IV Abx. ID following.    10. Debility- PT/OT    11. Guarded prognosis.     12. On TPN. 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 Cant tolerate PO. Weekly LFT while on TPN. TPN being managed by pharmacy. Electrolytes replacement through TPN. LFT are WNL so far.    A/p reviewed and updated.  Continue current treatment plan.        Prophylaxis;  GI Prophylaxis: Pepcid  DVT Prophylaxis: Lovenox     Code Status: Full Code      Tallon Gertz MD.       08/10/2015

## 2015-08-11 LAB — POC GLU MONITORING DEVICE
POC Glucose Monitoring Device: 101 mg/dL (ref 70–100)
POC Glucose Monitoring Device: 111 mg/dL (ref 70–100)
POC Glucose Monitoring Device: 107 mg/dL (ref 70–100)

## 2015-08-11 MED ORDER — TPN ADULT (WCH/Drake)
10 | INTRAVENOUS | Status: AC
Start: 2015-08-11 — End: 2015-08-12
  Administered 2015-08-11: 22:00:00 via INTRAVENOUS

## 2015-08-11 MED FILL — HYDROMORPHONE 2 MG/ML INJECTION SYRINGE: 2 2 mg/mL | INTRAMUSCULAR | Qty: 2

## 2015-08-11 MED FILL — OXYCODONE 5 MG TABLET: 5 5 MG | ORAL | Qty: 2

## 2015-08-11 MED FILL — ONDANSETRON HCL (PF) 4 MG/2 ML INJECTION SOLUTION: 4 4 mg/2 mL | INTRAMUSCULAR | Qty: 2

## 2015-08-11 MED FILL — FAMOTIDINE (PF) 20 MG/2 ML INTRAVENOUS SOLUTION: 20 20 mg/2 mL | INTRAVENOUS | Qty: 2

## 2015-08-11 MED FILL — OXYCODONE 5 MG TABLET: 5 5 MG | ORAL | Qty: 1

## 2015-08-11 MED FILL — GABAPENTIN 250 MG/5 ML ORAL SOLUTION: 50 50 mg/mL | ORAL | Qty: 5

## 2015-08-11 MED FILL — VANCOMYCIN 1,000 MG INTRAVENOUS INJECTION: 1000 1000 mg | INTRAVENOUS | Qty: 750

## 2015-08-11 MED FILL — PIPERACILLIN-TAZOBACTAM 3.375 GRAM INTRAVENOUS SOLUTION: 3.375 3.375 gram | INTRAVENOUS | Qty: 1

## 2015-08-11 MED FILL — FENTANYL 75 MCG/HR TRANSDERMAL PATCH: 75 75 mcg/hr | TRANSDERMAL | Qty: 1

## 2015-08-11 MED FILL — ENOXAPARIN 40 MG/0.4 ML SUBCUTANEOUS SYRINGE: 40 40 mg/0.4 mL | SUBCUTANEOUS | Qty: 0.4

## 2015-08-11 MED FILL — CLOPIDOGREL 75 MG TABLET: 75 75 mg | ORAL | Qty: 1

## 2015-08-11 MED FILL — TRAVASOL 10 % INTRAVENOUS SOLUTION: 10 10 % | INTRAVENOUS | Qty: 799

## 2015-08-11 MED FILL — FLUCONAZOLE 200 MG/100 ML IN SOD. CHLORIDE (ISO) INTRAVENOUS PIGGYBACK: 200 200 mg/100 mL | INTRAVENOUS | Qty: 100

## 2015-08-11 NOTE — Unmapped (Signed)
Problem: Fall Prevention  Goal: Patient will remain free of falls  Assess and monitor vitals signs, neurological status including level of consciousness and orientation. Reassess fall risk per hospital policy.    Ensure arm band on, uncluttered walking paths in room, adequate room lighting, call light and overbed table within reach, bed in low position, wheels locked, side rails up per policy, and non-skid footwear provided.   Outcome: Progressing  Patient is non ambulatory. Bed in low position with wheels locked, side rails up per policy. Call light within reach.

## 2015-08-11 NOTE — Unmapped (Signed)
Hospitalist Progress Note    08/11/2015     Linda Ponce   LOS: 34 days       HPI;    Patient is s/p OR on 05/31/2015 with [facility] for incision and drainage of complex abdominal wall abscess, rectal EUA, cystourethroscopy, bilateral retrograde pyelograms and removal old stent and placement of left 6x24 firm stent and right ureter embolization, right nephrostomy tube exchange, left nephrostomy tube placement and pelvic drain exchange on 06/06/2015 at a hospital in [place]. She was admitted to [facility] with severe sepsis secondary to peritonitis with stool coming from multiple abdominal wounds. She was seen by Critical Care, general surgery, and ID. General surgery recommended NPO and bowel rest and TPN has been started. She is receiving continued dressing changes and nephrostomy tube care. In addition, she was noted to have gangrene of the right foot and Dr. Donzetta Sprung performed angiogram, thrombectomy, and EIA stent on 06/27/15. Dr. Simeon Craft has evaluated and will follow as outpatient to consider transmetatarsal amputation at a later date. Patient is hemodynamically stable. She was  to [facility] and will need continued wound care, IV Vanc and Zosyn, and TPN.  She was sent back to [facility] on 07/05/2015  with abdominal pain and leukocytosis.CT scan showed multiple abdominal abscesses with large pelvic abscess . Her Hgb was 6.8 in ED and she was transfused 1 unit of blood.     Subjective & Interval History;      No new event  was noted.  Looking tearful because of poor pain control . She states that she want more time for new pain medication regimen.  Don't like 4 time daily finger stick glucose monitoring.  Stays on TPN.  Bilateral nephrostomy tubes has good output.    +anxiety.    Review of Systems:   as above    PMH: Unchanged from H&P.   PSH: Unchanged from H&P.   Family medical history: Unchanged from H&P.   Social history: Unchanged from H&P      Scheduled Meds:  ??? clopidogrel  75 mg Per  NG tube Daily 0900   ??? enoxaparin  40 mg Subcutaneous Daily 0900   ??? famotidine (PF)  20 mg Intravenous BID   ??? fentaNYL  1 patch Transdermal Q72H   ??? fluconazole in NaCl (iso-osm)  200 mg Intravenous Q24H   ??? gabapentin  125 mg Oral 3 times per day   ??? insulin regular  0-5 Units Subcutaneous Daily 0900   ??? oxyCODONE  10 mg Oral 6x Daily   ??? piperacillin-tazobactam (ZOSYN) IV extended interval  3.375 g Intravenous Q8H   ??? vancomycin  750 mg Intravenous Q12H     Continuous Infusions:  ??? TPN ADULT ([facility]) 69.8 mL/hr at 08/10/15 1746   ??? TPN ADULT ([facility])       PRN Meds:ALPRAZolam, alteplase, dextrose, dextrose 50 % in water (D50W), diclofenac sodium, heparin lock flush, HYDROmorphone, ipratropium-albuterol, LORazepam, ondansetron    Objective:    Vital signs in last 24 hours:  Temp:  [97.9 ??F (36.6 ??C)-99.1 ??F (37.3 ??C)] 97.9 ??F (36.6 ??C)  Heart Rate:  [112-126] 126  Resp:  [16-26] 26  BP: (95-105)/(58-75) 103/75 mmHg    Physical Examination:    GENERAL: The patient is awake,not in acute cardiorespiratory distress. Looking chronically sick.  HEENT:Atraumatic.Normocephalic. PERRLA, EOMI. No icterus, no pallor, no jugular venous distention..No ear or nose discharge.  NECK: Supple. Trachea is in midline. No Thyromegaly  CHEST: Symmetric. Clear to auscultation,  bilateral equal air entry. No wheezing or rhonchi.  CARDIOVASCULAR: S1, S2, Regular pulse. No murmur or gallops. No JVD.  ABDOMEN: Whole anterior abdomen is covered with pouch. Brownish fistula output noted.Bilateral nephrostomy tubes.  EXTREMITIES: There is + BLE edema . Very tender to touch both legs.  Right toes are gangrenous bur new tissue is growing.Old skin is sloughing.  Skin;No bruise,No rash.No petechiae.  CNS; CN II-XII grossly intact. No focal neurological deficit.       Intake/Output last 3 shifts:  I/O last 3 completed shifts:  In: 4143 [P.O.:118; I.V.:1402]  Out: 1950 [Urine:1250; Drains:700]    Recent Labs;                           Lab name  07/31/15  0519 08/01/15  0621 08/05/15  0351   WBC 7.0 8.3 6.6   HEMOGLOBIN 9.0* 8.9* 8.1*   HEMATOCRIT 27.6* 27.0* 24.5*   MEAN CORPUSCULAR VOLUME 92.3 91.9 93.6   PLATELETS 562* 568* 576*                                                            Lab name 08/01/15  0621 08/05/15  0351 08/08/15  0950   SODIUM 140   140 142 139   POTASSIUM 4.3   4.3 4.0 4.2   CREATININE 0.42*   0.42* 0.44* 0.54*   BUN 28*   28* 24 27*   CHLORIDE 108   108 109 107   CO2 26   26 26 26    PHOSPHORUS 3.6   3.6 3.9 4.1   MAGNESIUM 1.8 1.7 1.8                         Lab name 07/22/15  0604 07/26/15  0746 07/29/15  0409 08/05/15  0351   AST 22 22 22 17    ALT 29 21 22 16    BILIRUBIN TOTAL 0.3 0.3 0.3 0.2       Lab name 07/05/15  0702  07/09/15  0544  08/01/15  0621 08/05/15  0351 08/08/15  0950   PREALBUMIN 14.0*  --  16.0*  --   --   --   --    ALBUMIN  --   < > 1.5*  < > <1.5* <1.5*   <1.5* <1.5*   < > = values in this interval not displayed.                                                                                                   Lab name 08/05/15  0351 08/08/15  0950   CALCIUM 7.7* 8.2*   PHOSPHORUS 3.9 4.1                              Lab name 07/29/15  0409 08/05/15  0351   TRIGLYCERIDES 114 107                        Lab name 08/05/15  0900 08/07/15  0850 08/10/15  0830   VANCOMYCIN TROUGH 9.3* 9.1* 17.4       Diet;    Diet Orders          TPN ADULT ([facility]) at 69.8 mL/hr starting at 04/30 1800    TPN ADULT ([facility]) at 69.8 mL/hr starting at 04/29 1800    Diet NPO effective now Except for: except ice chips, except sips of clears starting at 03/28 1610          Future Appointments:  No future appointments.    Assessment/Plan:      1. Peritoneal abscess- Mixed flora, pigtail drain 06/26/15 Refused followup CT, still requires one. Vanco/Zosyn/Diflucan.           Will complete 6 weeks IV therapy - projected stop 5/1 as per ID notes.    2. Enterocutaneous Fistula- wound care, bowel rest. Still has significant  output.    3. Severe intractable acute on chronic pain syndrome- receiving Dilaudid IV every 3 hr  Prn,Fentanyl patch decreased to 75 from 125  mcg. Started PO Oxycodone scheduled as she had better response with it previously. Added Gabapentin as she was on previously.         Consulted Psychiatry for depression, chronic pain and recommended SNRIs to help with pain and depression.She want to try therapy first.      4. Thrombocytosis. uncertain etiology- follow closely. Likely reactive.    5. Cachexia-   continue TPN, recheck PAB. Albumin stays less than 1.5.    6. Sinus tachycardia. Likely related to pain. Monitor.    7. Ischemic feet/arterial thromboemboli- The patient had a clot in her femoral artery right leg that was cleared by vascular surgery.  However the patient ended up with necrosis at her distal digits which may be a combination of lack of blood flow and the use of pressors. Refusing heparin, aspirin and Plavix. Refused all heparin injections previously as well. Canceled aspirin but continue to encourage Plavix..           Following podiatrist at [facility].    8. History of colon cancer s/p exenteration with bilateral nephrostomy tubes    9. Recurrent presacral abscesses. Currently on broad spectrum IV Abx. ID following.    10. Debility- PT/OT    11. Guarded prognosis.     12. On TPN. 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 Cant tolerate PO. Weekly LFT while on TPN. TPN being managed by pharmacy. Electrolytes replacement through TPN. LFT are WNL so far.    A/p reviewed and updated.  Continue current treatment plan.  F/u monitoring labs tomorrow.      Prophylaxis;  GI Prophylaxis: Pepcid  DVT Prophylaxis: Lovenox     Code Status: Full Code      Madelline Eshbach MD.       08/11/2015

## 2015-08-11 NOTE — Unmapped (Signed)
Pt alert and oriented x4 upon assessment, with extreme drowsiness r/t medication. She does not want to take her gabapentin, Lovenox, or oxycodone, and needs much encouragement to take her plavix. She states that she is nauseated, she is often most nauseated after IV pain medication, but states that is the only thing that makes her legs feel better. Given prn zofran which seemed to help with the nausea. VSS.

## 2015-08-11 NOTE — Unmapped (Signed)
Patient awake and asking for IV Dilaudid before it is due.Tearful, anxious episodes, refuses to take anti anxiety meds, states they make her drowsy. Discussed need for sleep with patient, as was awake all last night and asking for IV Dilaudid before medication was due several times. Insistent on having Dilaudid every three hours, often will not take Oxycodone of Neurontin when it is scheduled.

## 2015-08-11 NOTE — Unmapped (Signed)
INFFECTIOUS DISEASE PROGRESS NOTE    08/11/2015  12:03 PM    Linda Ponce is a 45 y.o. female patient. Hospital Day: 34     Principal Problem:    Fistula  Active Problems:    Abdominal abscess    Enterocutaneous fistula    Malnutrition    Adjustment disorder with mixed anxiety and depressed mood    Pain of multiple sites    Cognitive dysfunction-recent onset.      Subjective:    Seen soon after awakening - was comfortably asleep  Reports severe generalized pain - little cooperation with history or exam  No fever noted      Review of Systems   Unable to perform ROS: severity of pain       Objective:  Vital signs:  Filed Vitals:    08/11/15 0655   BP: 105/64   Pulse: 119   Temp: 98.4 ??F (36.9 ??C)   Resp: 16   SpO2: 98%     Temp last 24 hours:Temp (24hrs), Avg:98.8 ??F (37.1 ??C), Min:98.4 ??F (36.9 ??C), Max:99.1 ??F (37.3 ??C)    Scheduled Meds:  ??? clopidogrel  75 mg Per NG tube Daily 0900   ??? enoxaparin  40 mg Subcutaneous Daily 0900   ??? famotidine (PF)  20 mg Intravenous BID   ??? fentaNYL  1 patch Transdermal Q72H   ??? fluconazole in NaCl (iso-osm)  200 mg Intravenous Q24H   ??? gabapentin  125 mg Oral 3 times per day   ??? insulin regular  0-5 Units Subcutaneous Daily 0900   ??? oxyCODONE  10 mg Oral 6x Daily   ??? piperacillin-tazobactam (ZOSYN) IV extended interval  3.375 g Intravenous Q8H   ??? vancomycin  750 mg Intravenous Q12H     Continuous Infusions:  ??? TPN ADULT ([facility]) 69.8 mL/hr at 08/10/15 1746   ??? TPN ADULT ([facility])       PRN Meds:ALPRAZolam, alteplase, dextrose, dextrose 50 % in water (D50W), diclofenac sodium, heparin lock flush, HYDROmorphone, ipratropium-albuterol, LORazepam, ondansetron    Intake/Output last 3 shifts:    Date 08/10/15 0700 - 08/11/15 0659 08/11/15 0700 - 08/12/15 0659   Shift 0700-1459 1500-2259 2300-0659 24 Hour Total 0700-1459 1500-2259 2300-0659 24 Hour Total   I  N  T  A  K  E   I.V.  (mL/kg)  747  (18.3) 192  (4.7) 939  (23)          I.V.  747 192 939        TPN  1225 562 1787           Volume Infused  (mL)  1225 562 1787        Shift Total  (mL/kg)  1972  (48.3) 754  (18.4) 2726  (66.7)       O  U  T  P  U  T   Urine  (mL/kg/hr)   550  (1.7) 550  (0.6)          Urine   550 550        Shift Total  (mL/kg)   550  (13.5) 550  (13.5)       Weight (kg) 40.8 40.8 40.9 40.9 40.9 40.9 40.9 40.9       Physical Exam   Nursing note and vitals reviewed.  Constitutional: She is oriented to person, place, and time. She appears distressed.   HENT:   Mouth/Throat: No oropharyngeal exudate.   Eyes: Pupils are equal, round, and  reactive to light. No scleral icterus.   Palor   Neck: Neck supple. No JVD present.   Cardiovascular: Normal rate, regular rhythm and normal heart sounds.    Pulmonary/Chest: Effort normal and breath sounds normal. She has no rales.   Abdominal: There is tenderness.   Enterostomy pouch over fistula  Pelvic drain   Musculoskeletal: She exhibits no edema.   Neurological: She is alert and oriented to person, place, and time. No cranial nerve deficit.       Labs:   Lab Results   Component Value Date    GLUCOSE 97 08/08/2015    BUN 27* 08/08/2015    CO2 26 08/08/2015    CREATININE 0.54* 08/08/2015    K 4.2 08/08/2015    NA 139 08/08/2015    CL 107 08/08/2015    CALCIUM 8.2* 08/08/2015     Lab Results   Component Value Date    MG 1.8 08/08/2015     Lab Results   Component Value Date    PHOS 4.1 08/08/2015     Lab Results   Component Value Date    WBC 6.6 08/05/2015    HGB 8.1* 08/05/2015    HCT 24.5* 08/05/2015    MCV 93.6 08/05/2015    PLT 576* 08/05/2015     No results found for: PTT, INR    Assessment/Plan:  Enterocutaneous fistula  Pelvic abscess post drainage   Completes 6 weeks IV therapy 5/1   No leukocytosis, no fever   Will discontinue antibiotics 5/1    Arbie Cookey  08/11/2015

## 2015-08-11 NOTE — Unmapped (Signed)
Problem: Potential for Compromised Skin Integrity  Goal: Skin integrity is maintained or improved  Assess and monitor skin integrity. Identify patients at risk for skin breakdown on admission and per policy. Collaborate with interdisciplinary team and initiate plans and interventions as needed.   Intervention: Relieve pressure to bony prominences  Provide measures to decrease pressure to skin such as specialty mattresses, egg crate mattresses and beds, elbow and heel protectors.   Pt very reluctant to turn and reposition r/t pain. But states she can feel when she is damp, and when she needs to reposition herself. Dressings are clean dry, and in tact. Drainage bags and tubing changed out r/t sluggish draining.

## 2015-08-12 LAB — RENAL FUNCTION PANEL W/EGFR
Albumin: <1.5 g/dL (ref 3.5–5.7)
Anion Gap: 5 mmol/L (ref 3–16)
BUN: 23 mg/dL (ref 7–25)
CO2: 28 mmol/L (ref 21–33)
Calcium: 7.9 mg/dL (ref 8.6–10.3)
Chloride: 107 mmol/L (ref 98–110)
Creatinine: 0.45 mg/dL (ref 0.60–1.30)
Glucose: 93 mg/dL (ref 70–100)
Osmolality, Calculated: 293 mOsm/kg (ref 278–305)
Phosphorus: 3.8 mg/dL (ref 2.1–4.7)
Potassium: 4.1 mmol/L (ref 3.5–5.3)
Sodium: 140 mmol/L (ref 133–146)
eGFR AA CKD-EPI: >90 See note.
eGFR NONAA CKD-EPI: >90 See note.

## 2015-08-12 LAB — HEPATIC FUNCTION PANEL
ALT: 15 U/L (ref 7–52)
AST: 14 U/L (ref 13–39)
Albumin: <1.5 g/dL (ref 3.5–5.7)
Alkaline Phosphatase: 152 U/L (ref 36–125)
Bilirubin, Direct: 0.11 mg/dL (ref 0.00–0.40)
Bilirubin, Indirect: 0.09 mg/dL (ref 0.00–1.10)
Total Bilirubin: 0.2 mg/dL (ref 0.0–1.5)
Total Protein: 4.5 g/dL (ref 6.4–8.9)

## 2015-08-12 LAB — POC GLU MONITORING DEVICE
POC Glucose Monitoring Device: 103 mg/dL (ref 70–100)
POC Glucose Monitoring Device: 116 mg/dL (ref 70–100)

## 2015-08-12 LAB — TRIGLYCERIDES: Triglycerides: 115 mg/dL (ref 10–149)

## 2015-08-12 LAB — CBC
Hematocrit: 24.6 % (ref 35.0–45.0)
Hemoglobin: 7.9 g/dL (ref 11.7–15.5)
MCH: 30.4 pg (ref 27.0–33.0)
MCHC: 32.2 g/dL (ref 32.0–36.0)
MCV: 94.4 fL (ref 80.0–100.0)
MPV: 6.4 fL (ref 7.5–11.5)
Platelets: 612 10*3/uL (ref 140–400)
RBC: 2.6 10*6/uL (ref 3.80–5.10)
RDW: 19.4 % (ref 11.0–15.0)
WBC: 6.9 10*3/uL (ref 3.8–10.8)

## 2015-08-12 LAB — CALCIUM FREE, SERUM: Free Calcium, Ser: 4.86 mg/dL (ref 4.40–5.40)

## 2015-08-12 LAB — MAGNESIUM: Magnesium: 1.6 mg/dL (ref 1.5–2.5)

## 2015-08-12 MED ORDER — TPN ADULT (WCH/Drake)
10 | INTRAVENOUS | Status: AC
Start: 2015-08-12 — End: 2015-08-13
  Administered 2015-08-12: 21:00:00 via INTRAVENOUS

## 2015-08-12 MED FILL — OXYCODONE 5 MG TABLET: 5 5 MG | ORAL | Qty: 2

## 2015-08-12 MED FILL — ENOXAPARIN 40 MG/0.4 ML SUBCUTANEOUS SYRINGE: 40 40 mg/0.4 mL | SUBCUTANEOUS | Qty: 0.4

## 2015-08-12 MED FILL — FAMOTIDINE (PF) 20 MG/2 ML INTRAVENOUS SOLUTION: 20 20 mg/2 mL | INTRAVENOUS | Qty: 2

## 2015-08-12 MED FILL — GABAPENTIN 250 MG/5 ML ORAL SOLUTION: 50 50 mg/mL | ORAL | Qty: 5

## 2015-08-12 MED FILL — HYDROMORPHONE 2 MG/ML INJECTION SYRINGE: 2 2 mg/mL | INTRAMUSCULAR | Qty: 2

## 2015-08-12 MED FILL — TRAVASOL 10 % INTRAVENOUS SOLUTION: 10 10 % | INTRAVENOUS | Qty: 799

## 2015-08-12 MED FILL — PIPERACILLIN-TAZOBACTAM 3.375 GRAM INTRAVENOUS SOLUTION: 3.375 3.375 gram | INTRAVENOUS | Qty: 1

## 2015-08-12 MED FILL — ONDANSETRON HCL (PF) 4 MG/2 ML INJECTION SOLUTION: 4 4 mg/2 mL | INTRAMUSCULAR | Qty: 2

## 2015-08-12 MED FILL — VANCOMYCIN 1,000 MG INTRAVENOUS INJECTION: 1000 1000 mg | INTRAVENOUS | Qty: 750

## 2015-08-12 MED FILL — FLUCONAZOLE 200 MG/100 ML IN SOD. CHLORIDE (ISO) INTRAVENOUS PIGGYBACK: 200 200 mg/100 mL | INTRAVENOUS | Qty: 100

## 2015-08-12 MED FILL — CLOPIDOGREL 75 MG TABLET: 75 75 mg | ORAL | Qty: 1

## 2015-08-12 NOTE — Unmapped (Signed)
Problem: Pain  Goal: LTG - Patient will manage pain with the appropriate technique/Intervention  Outcome: Not Progressing  Patient continues to have difficulty with pain management, requiring IV pain medications every 3 hours.

## 2015-08-12 NOTE — Unmapped (Signed)
Physical Therapy                                              Physical Therapy Treatment     Name: Linda Ponce  DOB: 08-Oct-1970  Attending Physician: Valda Lamb, MD  Admission Diagnosis: jewish 07/05/15 dx: sepsis  Date: 08/12/2015    Reviewed Pertinent hospital course: Yes  Hospital Course PT/OT: Hospital Course PT/OT: 8f with history of colorectal CA, initially admitted to [facility] post operatively for continued wound care. Developed bleeding, sent back to [facility] where she was foundto have multiple pelvic abscesses, enterocutaneous fistulas, was kept NPO, started on IV abx, TPN, and returns for continued care. PRECAUTIONS: FULL CODE, AAT, NO WEIGHT BEARING RESTRICTIONS, ISCHEMIC R TOES, MULTIPLE DRAINS FROM ABDOMEN AND BACK, (updated 08/05/14-pt now NWB on B LEs x 2 weeks per Dr. Luan Pulling)     Assessment     Assessment: Impaired Bed Mobility, Impaired Transfer Mobility, Impaired Ambulation, Impaired Balance, Deconditioning, Impaired Strength, Impaired ROM  Prognosis: Guarded (due to multiple medical issues )    Goals  Pt Will Go Supine To Sit: Minimal (to get in and out of bed within 4 weeks. )  Sit To Stand: Min A with LRAD to prepare for gait and transfers within 6 weeks.   Pt Will Transfer Bed/Chair: Minimal (with LRAD to increase independence in home within 6 weeks. )  Pt Will Ambulate: Minimal (25' with RW to access home within 6 weeks. )  Miscellaneous Goal #1: Pt will propel w/c 150' with B UEs with supervision to increase independence in home and community within 6 weeks.   Long Term Goal : Pt will be independent with HEP to improve B LE strength to facilitate safe mobility within 8 weeks.   Time frame for goals to be met in: 8 weeks, 09/14/15    Recommendation  Plan  Treatment/Interventions: LE strengthening/ROM, Endurance training, Patient/family training, Equipment eval/education, Museum/gallery curator, Therapeutic Exercise, Therapeutic Activity, Continued evaluation, Wheelchair Mobility, Compensatory technique  education, Neuromuscular Reeducation, Gait training  PT Frequency:  (2-4x/wk )    Recommendation  Recommendation: Short-term skilled PT  Equipment Recommended: Defer at this time    Problem List  Patient Active Problem List   Diagnosis   ??? Abdominal abscess   ??? Enterocutaneous fistula   ??? Malnutrition   ??? Fistula   ??? Adjustment disorder with mixed anxiety and depressed mood   ??? Pain of multiple sites   ??? Cognitive dysfunction-recent onset.        Past Medical History  Past Medical History   Diagnosis Date   ??? Cancer    ??? Abscess of abdominal cavity    ??? Gangrene of foot    ??? AKI (acute kidney injury)    ??? Fistula    ??? Peritonitis and retroperitoneal infections    ??? Cachexia    ??? Hyperkalemia    ??? Dehydration    ??? Encephalopathies    ??? Ischemic foot    ??? 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 Dilaudid IV q3h prn reduces it to 5/10 but does not last.  Pt seems to be a fentanyl non-responder, and  may need alternate narcotic. No hist        Past Surgical History  Past Surgical History   Procedure Laterality Date   ??? Colon surgery         Cognition:  Orientation Level: Oriented X4     Pain:  Pain Score: 10-Worst pain ever  Pain Location: Leg  Pain Descriptors: Constant;Aching  Pain Intervention(s): Pain meds provided during treatment          Mobility:  Bed Mobility  Supine to Sit: Max assist to left  Sit to Supine: Max assist to left    Pt worked on dressing and some grooming supine in bed; also multiple rolling L/R to place sheets and fix drains. Pt then tolerated 15 mins EOB; SBA for balance. Pt tolerated stretching to LE's and some UB stretching.  Pt left supine and positioned per request all needs met and in reach. Refer to OT note for details with ADLs and OT  stretching.         B knee ext stretch x 2 reps for 10-20 reps       Patient Education  Importance of positioning and stretching B LEs, safety with transfers and importance of OOB  Time  Start Time: 1120  Stop Time: 1210  Time Calculation (min): 50 min  Cotx with OT x 50 mins due to increased level of assist.   Charges       $Therapeutic Activity: 50 mins    Rulon Abide, PT, DPT [phone]

## 2015-08-12 NOTE — Unmapped (Signed)
Pharmacy Nutrition Support Service    Ethelmae Ringel is a 45 y.o. female on TPN.  Pharmacy was consulted for monitoring and adjustment.    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    Pt Weights:  40.9 kg (08/11/15)    40.8 kg (08/03/15)    38.7 kg (07/31/15)    39.8 kg (07/27/15)    42.8 kg (07/21/15)    Estimated Nutritional Needs: Per Dietary on 07/09/15   Based on Weight 45.6 kg   Amino Acids: 55-65 grams (1.2-1.4 grams/kg/day)   Fluid: 1550 mL/day   Total Daily Calories: 1500-1565 kcal (33-34 kcal/kg/day)    Current TPN Formula (further increased on 4/21)   Based on current weight of 40.9 g   Amino Acids: 47.7 g/L (80 grams, 1.96 grams/kg/day, 320 kcal)   Dextrose: 175 g/L (293 grams, 997 kcal)   Lipids: 21 g/L (35.2 grams, 352 kcal)   Total Daily Calories: 1669 kcal (41 kcal/kg/day)   Volume: 1675 mL   Rate: 69.8 mL/h    TPN Medication Recent History (Show up to 1 orders; newest on the left.)     Start date and time   08/11/2015 1800      TPN ADULT ([facility]) [[id number]]    Order Status  Active    Last Given  08/11/2015 1739       Macro Ingredients    amino acid 10%  47.7 g/L    dextrose 70%  175 g/L       QS Base    sterile water  278.65 mL       Lipids    fat emulsion 30 %  21 g/L       TPN Electrolytes entered as PER DAY amount    sodium chloride  80 mEq    magnesium sulfate  16 mEq    calcium gluconate  5 mEq    potassium chloride  20 mEq    potassium phosphate  15 mmol       TPN Additives entered as PER DAY amount    MVI adult no. 1 with vitamin K  10 mL    ascorbic acid (vitamin C)  200 mg    trace elements (MULTITRACE-5)  1 mL       Calorie Contribution    Proteins  319.6 kcal    Dextrose  997.22 kcal    Lipids  351.9 kcal    Total  1,668.72 kcal       Electrolyte Ion Calculated Amount    Sodium  80 mEq    Potassium  42 mEq    Calcium  5 mEq    Magnesium  15.83 mEq    Aluminum  --    Phosphate  15 mmol    Chloride  100 mEq    Acetate  --       Other    Total Protein  79.9 g     Total Protein/kg  1.95 g/kg    Glucose Infusion Rate  4.98 mg/kg/min    Osmolarity  1,537.16    Volume  1,675 mL    Rate  69.8 mL/hr    Dosing Weight  40.9 kg    Infusion Site  Central        Diet Orders          TPN ADULT ([facility]) at 69.8 mL/hr starting at 04/30 1800    Diet NPO effective now Except for: except ice chips, except sips of clears starting at 03/28  5409          TPN Associated Medications:    - GI Prophylaxis: famotidine   - Antiemetic: ondansetron PRN   - Bowel Regimen: none   - Insulin Regimen: sliding scale regular insulin     Recent Labs:    Lab 08/12/15  0521 08/08/15  0950   SODIUM 140 139   POTASSIUM 4.1 4.2   CHLORIDE 107 107   CO2 28 26   BUN 23 27*   CREATININE 0.45* 0.54*   GLUCOSE 93 97   CALCIUM 7.9* 8.2*   PHOSPHORUS 3.8 4.1   MAGNESIUM 1.6 1.8     Free calcium: 4.86 (goal 4.40 - 5.40 mg/dL)      Lab 81/19/14  7829 08/08/15  0950   ALK PHOS 152*  --    ALT 15  --    AST 14  --    BILIRUBIN TOTAL 0.2  --    BILIRUBIN DIRECT 0.11  --    TOTAL PROTEIN 4.5*  --    ALBUMIN <1.5*   <1.5* <1.5*         Lab 08/12/15  0521   TRIGLYCERIDES 115         Assessment and Plan:    - Macronutrients: formulation was increased on 4/17 and again on 4/21 as per dietary's recommendations. Continue same formulation from 4/21.  - Electrolytes: stable. Will slightly increase magnesium in TPN today.  - Multivitamin/Trace Elements: provided daily in TPN.   - Glucose: has been WNL, no sliding scale insulin doses required since 4/14.  - Other additives: ascorbic acid 200 mg added to each bag considering GI output and wound healing.  - Check labs twice weekly: Monday/Thursday.    Thank you for the consult.    Kristie Cowman, PharmD  08/12/2015 8:41 AM

## 2015-08-12 NOTE — Unmapped (Signed)
Hospitalist Progress Note    08/12/2015     Tinley Flott   LOS: 35 days       HPI;    Patient is s/p OR on 05/31/2015 with [facility] for incision and drainage of complex abdominal wall abscess, rectal EUA, cystourethroscopy, bilateral retrograde pyelograms and removal old stent and placement of left 6x24 firm stent and right ureter embolization, right nephrostomy tube exchange, left nephrostomy tube placement and pelvic drain exchange on 06/06/2015 at a hospital in [place]. She was admitted to [facility] with severe sepsis secondary to peritonitis with stool coming from multiple abdominal wounds. She was seen by Critical Care, general surgery, and ID. General surgery recommended NPO and bowel rest and TPN has been started. She is receiving continued dressing changes and nephrostomy tube care. In addition, she was noted to have gangrene of the right foot and Dr. Donzetta Sprung performed angiogram, thrombectomy, and EIA stent on 06/27/15. Dr. Simeon Craft has evaluated and will follow as outpatient to consider transmetatarsal amputation at a later date. Patient is hemodynamically stable. She was  to [facility] and will need continued wound care, IV Vanc and Zosyn, and TPN.  She was sent back to [facility] on 07/05/2015  with abdominal pain and leukocytosis.CT scan showed multiple abdominal abscesses with large pelvic abscess . Her Hgb was 6.8 in ED and she was transfused 1 unit of blood.     Subjective & Interval History;    Was sleeping comfortable on first visit.  When i went to see her again, she was awake and c/o right foot pain.  Tearful. Anxious about general health. Don't think she is ready to go home yet.    Fistula has significant output.  NO fever, chill, SOB, CP, Palpitation.    Review of Systems:   as above    PMH: Unchanged from H&P.   PSH: Unchanged from H&P.   Family medical history: Unchanged from H&P.   Social history: Unchanged from H&P      Scheduled Meds:  ??? clopidogrel  75 mg Per NG tube  Daily 0900   ??? enoxaparin  40 mg Subcutaneous Daily 0900   ??? famotidine (PF)  20 mg Intravenous BID   ??? fentaNYL  1 patch Transdermal Q72H   ??? fluconazole in NaCl (iso-osm)  200 mg Intravenous Q24H   ??? gabapentin  125 mg Oral 3 times per day   ??? insulin regular  0-5 Units Subcutaneous Daily 0900   ??? oxyCODONE  10 mg Oral 6x Daily   ??? piperacillin-tazobactam (ZOSYN) IV extended interval  3.375 g Intravenous Q8H   ??? vancomycin  750 mg Intravenous Q12H     Continuous Infusions:  ??? TPN ADULT ([facility]) 69.8 mL/hr at 08/11/15 1739   ??? TPN ADULT ([facility])       PRN Meds:ALPRAZolam, alteplase, dextrose, dextrose 50 % in water (D50W), diclofenac sodium, heparin lock flush, HYDROmorphone, ipratropium-albuterol, LORazepam, ondansetron    Objective:    Vital signs in last 24 hours:  Temp:  [98.3 ??F (36.8 ??C)-98.8 ??F (37.1 ??C)] 98.5 ??F (36.9 ??C)  Heart Rate:  [59-120] 59  Resp:  [21-25] 21  BP: (93-114)/(60-84) 101/60 mmHg    Physical Examination:    GENERAL: The patient is awake,not in acute cardiorespiratory distress. Looking chronically sick.  HEENT:Atraumatic.Normocephalic. PERRLA, EOMI. No icterus, no pallor, no jugular venous distention..No ear or nose discharge.  NECK: Supple. Trachea is in midline. No Thyromegaly  CHEST: Symmetric. Clear to auscultation, bilateral equal air  entry. No wheezing or rhonchi.  CARDIOVASCULAR: S1, S2, Regular pulse. No murmur or gallops. No JVD.  ABDOMEN: Whole anterior abdomen is covered with pouch. Brownish fistula output noted.Bilateral nephrostomy tubes.  EXTREMITIES: There is + BLE edema . Very tender to touch both legs.  Right toes are gangrenous bur new tissue is growing.Old skin is sloughing.  Skin;No bruise,No rash.No petechiae.  CNS; CN II-XII grossly intact. No focal neurological deficit.       Intake/Output last 3 shifts:  I/O last 3 completed shifts:  In: 3624 [P.O.:120; I.V.:1793]  Out: 1400 [Urine:1400]    Recent Labs;                           Lab name 08/01/15  0621  08/05/15  0351 08/12/15  0521   WBC 8.3 6.6 6.9   HEMOGLOBIN 8.9* 8.1* 7.9*   HEMATOCRIT 27.0* 24.5* 24.6*   MEAN CORPUSCULAR VOLUME 91.9 93.6 94.4   PLATELETS 568* 576* 612*                                                            Lab name 08/05/15  0351 08/08/15  0950 08/12/15  0521   SODIUM 142 139 140   POTASSIUM 4.0 4.2 4.1   CREATININE 0.44* 0.54* 0.45*   BUN 24 27* 23   CHLORIDE 109 107 107   CO2 26 26 28    PHOSPHORUS 3.9 4.1 3.8   MAGNESIUM 1.7 1.8 1.6                         Lab name 07/26/15  0746 07/29/15  0409 08/05/15  0351 08/12/15  0521   AST 22 22 17 14    ALT 21 22 16 15    BILIRUBIN TOTAL 0.3 0.3 0.2 0.2       Lab name 07/05/15  0702  07/09/15  0544  08/05/15  0351 08/08/15  0950 08/12/15  0521   PREALBUMIN 14.0*  --  16.0*  --   --   --   --    ALBUMIN  --   < > 1.5*  < > <1.5*   <1.5* <1.5* <1.5*   <1.5*   < > = values in this interval not displayed.                                                                                                   Lab name 08/08/15  0950 08/12/15  0521   CALCIUM 8.2* 7.9*   PHOSPHORUS 4.1 3.8                              Lab name 08/05/15  0351 08/12/15  0521   TRIGLYCERIDES 107 115  Lab name 08/05/15  0900 08/07/15  0850 08/10/15  0830   VANCOMYCIN TROUGH 9.3* 9.1* 17.4       Diet;    Diet Orders          TPN ADULT ([facility]) at 69.8 mL/hr starting at 05/01 1800    TPN ADULT ([facility]) at 69.8 mL/hr starting at 04/30 1800    Diet NPO effective now Except for: except ice chips, except sips of clears starting at 03/28 4540          Future Appointments:  No future appointments.    Assessment/Plan:      1. Peritoneal abscess- Mixed flora, pigtail drain 06/26/15 Refused followup CT, still requires one. Vanco/Zosyn/Diflucan.           Will complete 6 weeks IV therapy - projected stop 5/1 as per ID notes. ID Notes reviewed. OK to D/C Abx today.    2. Enterocutaneous Fistula- wound care, bowel rest. Still has significant output.    3. Severe  intractable acute on chronic pain syndrome- receiving Dilaudid IV every 3 hr  Prn,Fentanyl patch decreased to 75 from 125  mcg. Started PO Oxycodone scheduled as she had better response with it previously. Added Gabapentin as she was on previously.         Consulted Psychiatry for depression, chronic pain and recommended SNRIs to help with pain and depression.She want to try therapy first.      4. Thrombocytosis. uncertain etiology- follow closely. Likely reactive.    5. Cachexia-   continue TPN, recheck PAB. Albumin stays less than 1.5.    6. Sinus tachycardia. Likely related to pain. Monitor.    7. Ischemic feet/arterial thromboemboli- The patient had a clot in her femoral artery right leg that was cleared by vascular surgery.  However the patient ended up with necrosis at her distal digits which may be a combination of lack of blood flow and the use of pressors. Refusing heparin, aspirin and Plavix. Refused all heparin injections previously as well. Canceled aspirin but continue to encourage Plavix..           Following podiatrist at [facility].    8. History of colon cancer s/p exenteration with bilateral nephrostomy tubes    9. Recurrent presacral abscesses. Currently on broad spectrum IV Abx. ID following.    10. Debility- PT/OT    11. Guarded prognosis.     12. On TPN. 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 Cant tolerate PO. Weekly LFT while on TPN. TPN being managed by pharmacy. Electrolytes replacement through TPN. LFT are WNL so far. Mg level was low today. Pharmacist increased the dose of Magnesium.  13. D/C planning . on Wednesday has family meeting.           Prophylaxis;  GI Prophylaxis: Pepcid  DVT Prophylaxis: Lovenox     Code Status: Full Code      Leylah Tarnow MD.       08/12/2015

## 2015-08-12 NOTE — Unmapped (Signed)
Pt's favorite cousin here to visit, pt much more animated, smiling and talking more than usual.

## 2015-08-12 NOTE — Unmapped (Signed)
Occupational Therapy  [facility]  Occupational Therapy      Name: Linda Ponce  DOB: 04/27/1970  Attending Physician: Valda Lamb, MD  Admission Diagnosis: jewish 07/05/15 dx: sepsis  Date: 08/12/2015    Reviewed Pertinent hospital course: Yes   Hospital Course PT/OT: Hospital Course PT/OT: 3f with history of colorectal CA, initially admitted to [facility] post operatively for continued wound care. Developed bleeding, sent back to [facility] where she was foundto have multiple pelvic abscesses, enterocutaneous fistulas, was kept NPO, started on IV abx, TPN, and returns for continued care. PRECAUTIONS: FULL CODE, AAT, NO WEIGHT BEARING RESTRICTIONS, ISCHEMIC R TOES, MULTIPLE DRAINS FROM ABDOMEN AND BACK, (updated 08/05/14-pt now NWB on B LEs x 2 weeks per Dr. Luan Pulling)       Brief Assessment  Assessment  Assessment: Decreased ADL status, Decreased self-care trans, Decreased Functional Mobility, Decreased activity tolerance  Prognosis: Guarded  Goal Formulation: Other (comment) (per therapist )    Goals  Pt Will demonstrate functional chair transfer: Moderate (via slliding board.)  Pt Will demonstrate grooming task: Stand by assist (at w/c level.)  Pt Will participate in upper extremity HEP to prep for ADLs: strengthening / activity tolerance for functional use: Ongoing   Miscellaneous Goal #1:  (Patient will tolerate sitting EOB with maximal assist by September 16, 2015)  Long Term Goal : Patient to be seen 2-3 week trial period to determine if she can participate and benefit from active OT programming - goal met, pt ability to participate is limited by anxiety and avoidance. New Goal: Pt to be Mod A with sliding board transfer  by September 16, 2015  Time frame for goals to be met in: By September 16, 2015    Recommendation  Plan  Treatment Interventions: Compensatory technique education, Therapeutic Activity, Patient/Family training, Excercise, ADL retraining, Functional transfer training, Activity Tolerance  training, Energy Conservation  OT Frequency: 1-3x/wk    Cognition  Overall Cognitive Status: Within Functional Limits  Orientation Level: Oriented X4    Pain  Pain Score: 10-Worst pain ever  Pain Location: Leg  Pain Descriptors: Constant  Pain Intervention(s): Medication (See eMAR);Repositioned;Elevated     Mobility  Bed Mobility  Supine to Sit: Max assist to left  Sit to Supine: Max assist to left      Exercises  Interventions  Shoulder PROM: Extension;Sitting;Flexion (B UEs )    ADL  Where Assessed: Supine, bed  Grooming Assistance: Stand by  Grooming Deficit: Setup  UE Dressing Assistance: Stand by  UE Dressing Deficit: Setup;Pull around back     Pt worked on dressing and some grooming supine in bed; also multiple rolling L/R to place sheets and fix drains. Pt then tolerated 15 mins EOB; SBA for balance. Pt tolerated PT stretching to LE's and some UB stretching; refer above. Pt left supine and positioned per request all needs met and in reach.  Cont per POC in colboration with OTR/L  [name], COTA/L    Patient Education:  Improving strength overall with more mobility     Time  Start Time: 1120  Stop Time: 1210  Time Calculation (min): 50 min    Charges       $Therapeutic Exercise: 8-22 mins  $Therapeutic Activity: 8-22 mins  $Self Care/ADL/Home Management Training: 8-22 mins

## 2015-08-12 NOTE — Unmapped (Signed)
Pt refused 6pm CBG, said, 'it hurts, I just can't take it'. Pt very anxious, tearful,  esp regarding care. Responds well to reassurance and very careful turning and repositioning. Bilat lower legs are esp painful, elevated on pillows, as directed by pt.

## 2015-08-12 NOTE — Unmapped (Signed)
Patient dressings done per order, tol well.

## 2015-08-12 NOTE — Unmapped (Signed)
Problem: Fall Prevention  Goal: Patient will remain free of falls  Assess and monitor vitals signs, neurological status including level of consciousness and orientation. Reassess fall risk per hospital policy.    Ensure arm band on, uncluttered walking paths in room, adequate room lighting, call light and overbed table within reach, bed in low position, wheels locked, side rails up per policy, and non-skid footwear provided.   Outcome: Progressing  Patient assessed QS and PRN for fall risk, VS monitored, neuro status, LOC, and orientation. Fall risk armband on, room kept uncluttered, adequate lighting, call light maintained in easy reach. Bed maintained in lowest position, SR up per policy, non-skid footwear provided.

## 2015-08-12 NOTE — Unmapped (Signed)
Patient took evening Oxycodone and continues to refuse Neurontin. Medicated for pain with IV Dilaudid, Ativan offered for anxiety but patient declined.

## 2015-08-12 NOTE — Unmapped (Signed)
Patient refused 12 noon CBG, medicated with PRN Zofran at 1523 per pt request, and for pain PRN, effective.

## 2015-08-12 NOTE — Unmapped (Addendum)
[facility] Inpatient Medical Psychology Service  [place] Department of Neurology & Rehabilitation Medicine  Division of Neuropsychology & Medical Psychology     INPATIENT MEDICAL PSYCHOLOGY PROGRESS NOTE    Patient Name:  Linda Ponce          DOB:  1970/05/15  Admit Date:  07/08/2015   CSN:  [id number]    Referring Provider:  Valda Lamb, MD  Location:  [name] Room N319/DN319-02     Current Date:  08/12/2015  Time and Duration of Service:  8:25p-9:40p  = 75 minutes face to face time     CPT Code:  86578 - Health & Behavior Intervention - Individual - 5 units, 75 minutes face to face time    Current Problem List:      Patient Active Problem List   Diagnosis   ??? Abdominal abscess   ??? Enterocutaneous fistula   ??? Malnutrition   ??? Fistula   ??? Adjustment disorder with mixed anxiety and depressed mood   ??? Pain of multiple sites   ??? Cognitive dysfunction-recent onset.       Current Medications:     Scheduled Medications:    ??? clopidogrel  75 mg Per NG tube Daily 0900   ??? enoxaparin  40 mg Subcutaneous Daily 0900   ??? famotidine (PF)  20 mg Intravenous BID   ??? fentaNYL  1 patch Transdermal Q72H   ??? gabapentin  125 mg Oral 3 times per day   ??? insulin regular  0-5 Units Subcutaneous Daily 0900   ??? oxyCODONE  10 mg Oral 6x Daily     PRN Medications:  ALPRAZolam, alteplase, dextrose, dextrose 50 % in water (D50W), diclofenac sodium, heparin lock flush, HYDROmorphone, ipratropium-albuterol, LORazepam, ondansetron    Objective Presentation/Subjective Concerns:      ?? Pt alert and interested in talking. Presents as more calm despite ongoing problems.   ?? Reports pain is better controlled and appreciates fentanyl oxycodone cross taper. Wants fentanyl patch gone asap, but without withdrawal sx.  ?? Still c/o nausea. Does not think dilaudid is responsible, but has other medications that she think might do it that she plans to observe how she feels in the hour after taking  them.  ?? Emotionally feeling sad today; wants to be well quickly but realizes her wounds will take time. Just expressing self, but coping better.  ?? She has not been taking neurontin liquid due to very bad flavor (strong black licorice). I have left a note for pharmacy and talked to a pharmacists. Is pre-flavored.    Psychiatric Diagnoses:      ?? Cognitive dysfunction--recent onset.    ?? Likely residual of significant delirium at [facility], pain medications, distraction from pain itself, and possibly IV ABX. Will monotor. Primarily involves orientation to time, and somewhat to place, short term recall, and working memory. Expected to improve as pt's wounds and infections improve.  ?? MMSE-2 score of 21/20 (T=21) indicates moderate cognitive impairment involving orientation, short term recall, and working memory.   ?? Clinical assessment of reasoning skills indicated normal limits reasoning skills except when her pain gets severe, at which point she becomes fearful and suspicious about medications being withheld because she bother's the nurses too much.    ?? Pain of Multiple Sites.    ?? Pain involves abdominal abscess and fistula related wounds (9/10 w/fentanyl 125 mcg patch), gangrenous feet R>L with neuropathic component (8/10  w patch), and low back from multiple surgeries & radiation with neuropathic component (7/10, episodic, w/patch). Pt reports severe pain ranging from 8-10/10 even with fentanyl 125 mcg patch. Dilaudid IV q3h prn reduces it to 5/10 but does not last.  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 No history of narcotic abuse, but she has been on narcotics for abdominal and radiation pain from 2011 cancer surgery and complications x 6 yrs. Best regimen per pt post op at [facility] was oxycodone 15 mg q4h scheduled and dilaudid 1 mg q3h prn which she tapered down before discharge.She also took neurontin 300 mg TID when home prior to surgery  for neuropathic pain (low back).  Psychosocial losses (death of father 1 week before surgery, missing teen son who is in [place] w/her mother) reduce ability to cope with pain and hospitalization.     ?? Adjustment disorder with mixed anxiety and depressed mood.  ?? Related to prolonged hospitalization and inadequately controlled pain in abdomen, back, and legs.  ?? She has had NO prior treatment for anxiety or depression or substance use disorders.     Intervention(s):    ?? Provided support and facilitated exploration of concerns.  ?? Reinforced improvements in mood/emotional reactivity.  ?? Educated the patient re:    ?? Plan to ask MD to reduce fentanyl dose and increase scheduled oxycodone. Pt is agreeable if MD thinks it is medically acceptable.  ?? Importance of considering using neurontin given lack of effect of her narcotics on foot and leg pain. Limites PT. She agreed that she just needs to do it. I also encouraged her to ask to speak to the pharmacist following her meds.  ?? Importance of considering an antidepressant to further improve her 'emotional strength' which she feels like she has lost, especially given her long recovery time. Educated her again about mechanism, benefits in terms of reducing pain intensity perception, and the purpose being to make it easier for her to cope more effectively. Also informed her that Remeron comes in a Sol-tab.  ?? Followed up discussion of narcotics with discussion of importance of using more behavioral methods to reduce pain when meds are not enough. Reviewed psychological factors that increase pain, especially stress/tension and the like. Helped her find several relaxation Apps on her phone as well as one for chronic pain. She agreed to examine them and download a few to try.    DR SHAHID/ATTENDING MD:    ?? If enough time has passed, please consider reducing fentanyl patch to the next level down (25 mcg?--what ever is standard) and increase oxycodone to 15 mg q4h scheduled if  medically acceptable. Goal is to eliminate fentanyl. I will assist with monitoring.      Medical Psychology Follow-up Plan:    ?? I will continue to engage her in CBT for depression and coping with pain her complex medical problems.   ?? Pt agreed to look at the Apps, and to take her neurontin.  ?? Pt wants me to come to her family team meeting on Wed 08/14/15 at 2:30p.      Delaynie Stetzer P. Minerva Ends, PhD  Licensed Insurance risk surveyor in Medical Psychology  [place] Department of Neurology & Rehabilitation Medicine  Office Phone for non-urgent messages/requests:  [phone]  Urgent issues-pager: [phone]    08/12/2015

## 2015-08-13 ENCOUNTER — Inpatient Hospital Stay: Admit: 2015-08-13 | Payer: MEDICARE | Attending: Surgery | Primary: Internal Medicine

## 2015-08-13 LAB — POC GLU MONITORING DEVICE
POC Glucose Monitoring Device: 100 mg/dL (ref 70–100)
POC Glucose Monitoring Device: 105 mg/dL (ref 70–100)

## 2015-08-13 MED ORDER — LIDOCAINE HCL 4 % EX SOLN
4 % | Freq: Once | CUTANEOUS | Status: DC
Start: 2015-08-13 — End: 2015-08-14

## 2015-08-13 MED ORDER — TPN ADULT (WCH/Drake)
10 | INTRAVENOUS | Status: AC
Start: 2015-08-13 — End: 2015-08-14
  Administered 2015-08-13: 21:00:00 via INTRAVENOUS

## 2015-08-13 MED FILL — OXYCODONE 5 MG TABLET: 5 5 MG | ORAL | Qty: 2

## 2015-08-13 MED FILL — HYDROMORPHONE 2 MG/ML INJECTION SYRINGE: 2 2 mg/mL | INTRAMUSCULAR | Qty: 2

## 2015-08-13 MED FILL — FAMOTIDINE (PF) 20 MG/2 ML INTRAVENOUS SOLUTION: 20 20 mg/2 mL | INTRAVENOUS | Qty: 2

## 2015-08-13 MED FILL — ONDANSETRON HCL (PF) 4 MG/2 ML INJECTION SOLUTION: 4 4 mg/2 mL | INTRAMUSCULAR | Qty: 2

## 2015-08-13 MED FILL — GABAPENTIN 250 MG/5 ML ORAL SOLUTION: 50 50 mg/mL | ORAL | Qty: 5

## 2015-08-13 MED FILL — TRAVASOL 10 % INTRAVENOUS SOLUTION: 10 10 % | INTRAVENOUS | Qty: 799

## 2015-08-13 MED FILL — CLOPIDOGREL 75 MG TABLET: 75 75 mg | ORAL | Qty: 1

## 2015-08-13 NOTE — Unmapped (Signed)
Problem: Knowledge Deficit  Goal: Patient/family/caregiver demonstrates understanding of disease process, treatment plan, medications, and discharge instructions  Complete learning assessment and assess knowledge base.   Discuss future plans

## 2015-08-13 NOTE — Unmapped (Signed)
Spoke with Dr. Welton Flakes about giving patient Dilaudid dose a few minutes early before her departure for her Dr. Appointment this morning, orders noted.

## 2015-08-13 NOTE — Unmapped (Signed)
Pharmacy Consult Service    Kerline Trahan is a 45 y.o. female previously receiving vancomycin for pelvic abscess post drainage.  Vancomycin therapy was completed yesterday 5/1 therefore pharmacy will sign off.  Please re-consult if therapy is resumed.    Thank you for the consult.    Bud Kaeser, Pharm.D.    08/13/2015 9:20 AM

## 2015-08-13 NOTE — Unmapped (Signed)
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   Adequate intake via PO and TPN to support healing of areas on skin as diagnosis allows.   Outcome: Progressing  Weight stable x 1 week at  90 lb  TPN increased  4/21  Adequate hydration indicated  Blood sugars in adequate control    LFT WNL x Alk phos trending up  Toes gangrenous, coccyx no significant change, mid abd fistula no significant change, Left lateral abdomen abdomina fistula improved

## 2015-08-13 NOTE — Unmapped (Signed)
[facility]  Nutrition Progress Note    Nutrition Diagnosis:   1. Altered GI function - Continues    New Nutrition Diagnosis:  No    Diet: NPO with ice chips and sips of clears  TPN: Total Volume 1610ml/day rate 69.8 ml/hr:  AAcids 80g/day, Dextrose 293g/day, Lipid  35.19g/d per day to provide kcal 1668.72     Weight: (!) 90 lb 1.6 oz (40.869 kg) (08/11/15 0600)     Skin Condition:  Coccyx st 3 no significant change, mid abd fistula no significant change, Left lateral abdomen abdomina fistula improved smaller in size, right foot vascular wounds no significant change,  Right toes are gangrenous  Edema: BLE nonpitting    Labs:  Lab Results   Component Value Date    ALT 15 08/12/2015    AST 14 08/12/2015    ALKPHOS 152* 08/12/2015    BILITOT 0.2 08/12/2015     Lab Results   Component Value Date    TRIG 115 08/12/2015     Lab Results   Component Value Date    CREATININE 0.45* 08/12/2015    BUN 23 08/12/2015    NA 140 08/12/2015    K 4.1 08/12/2015    CL 107 08/12/2015    CO2 28 08/12/2015     Lab Results   Component Value Date    OSMOLALITY 293 08/12/2015     Lab Results   Component Value Date    CALCIUM 7.9* 08/12/2015    PHOS 3.8 08/12/2015    GLUCOSE 93 08/12/2015     Lab Results   Component Value Date    HGBA1C 4.7* 08/01/2015     Lab Results   Component Value Date    WBC 6.9 08/12/2015    HGB 7.9* 08/12/2015    HCT 24.6* 08/12/2015    MCV 94.4 08/12/2015    PLT 612* 08/12/2015         Component Value Date/Time    POCGMD 105* 08/13/2015 0445    POCGMD 100 08/12/2015 2351    POCGMD 116* 08/12/2015 0507    POCGMD 103* 08/12/2015 0017    POCGMD 107* 08/11/2015 1622     No results found for: VITD25H      Scheduled Meds:  ??? clopidogrel  75 mg Per NG tube Daily 0900   ??? enoxaparin  40 mg Subcutaneous Daily 0900   ??? famotidine (PF)  20 mg Intravenous BID   ??? fentaNYL  1 patch Transdermal Q72H   ??? gabapentin  125 mg Oral 3 times per day   ??? insulin regular  0-5 Units Subcutaneous Daily 0900   ??? oxyCODONE  10 mg Oral  6x Daily     Continuous Infusions:  ??? TPN ADULT ([facility]) 69.8 mL/hr at 08/12/15 1719   ??? TPN ADULT ([facility])       PRN Meds:.ALPRAZolam, alteplase, dextrose, dextrose 50 % in water (D50W), diclofenac sodium, heparin lock flush, HYDROmorphone, ipratropium-albuterol, LORazepam, ondansetron    Note: Weight stable x 1 week at 90lb.   TPN increased 4/17 and on 08/02/15.  Adequate hydration and BS control indicated.  Alk Phos elevated and mildly trending up, AST/ALT WNL.  Cr decreased likely r/t decreased muscle mass. Anemia likely non nutritional. Magnesium was increased in TPN yesterday. Ascorbic acid 200 mg added to each bag r/t GI output and wound healing. TPN provides 2.0 g/kg protein - considered adequate to aid wound healing.     Nutrition Prescription:   Food and Nutrition Therapy: Rec  continue current  TPN macronutrient levels.  Monitoring: weights, hydration,  Labs, skin condition    Plan of Care:Update/revised    Follow Up: Other:7-10 days    Mackey Birchwood RDN, LD  Pager [phone] Phone [phone]    08/13/2015 3:19 PM

## 2015-08-13 NOTE — Unmapped (Signed)
Assessment comp-leted call lightwithin reach

## 2015-08-13 NOTE — Unmapped (Signed)
Pharmacy Nutrition Support Service    Linda Ponce is a 45 y.o. female on TPN.  Pharmacy was consulted for monitoring and adjustment.    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    Pt Weights:  40.9 kg (08/11/15)    40.8 kg (08/03/15)    38.7 kg (07/31/15)    39.8 kg (07/27/15)    42.8 kg (07/21/15)    Estimated Nutritional Needs: Per Dietary on 07/09/15   Based on Weight 45.6 kg   Amino Acids: 55-65 grams (1.2-1.4 grams/kg/day)   Fluid: 1550 mL/day   Total Daily Calories: 1500-1565 kcal (33-34 kcal/kg/day)    Current TPN Formula (further increased on 4/21)   Based on current weight of 40.9 g   Amino Acids: 47.7 g/L (80 grams, 1.96 grams/kg/day, 320 kcal)   Dextrose: 175 g/L (293 grams, 997 kcal)   Lipids: 21 g/L (35.2 grams, 352 kcal)   Total Daily Calories: 1669 kcal (41 kcal/kg/day)   Volume: 1675 mL   Rate: 69.8 mL/h    TPN Medication Recent History (Show up to 1 orders; newest on the left.)     Start date and time   08/13/2015 1800      TPN ADULT ([facility]) [[id number]]    Order Status  Active       Macro Ingredients    amino acid 10%  47.7 g/L    dextrose 70%  175 g/L       QS Base    sterile water  278.11 mL       Lipids    fat emulsion 30 %  21 g/L       TPN Electrolytes entered as PER DAY amount    sodium chloride  80 mEq    magnesium sulfate  18 mEq    calcium gluconate  5 mEq    potassium chloride  20 mEq    potassium phosphate  15 mmol       TPN Additives entered as PER DAY amount    MVI adult no. 1 with vitamin K  10 mL    ascorbic acid (vitamin C)  200 mg    trace elements (MULTITRACE-5)  1 mL       Calorie Contribution    Proteins  319.6 kcal    Dextrose  997.22 kcal    Lipids  351.9 kcal    Total  1,668.72 kcal       Electrolyte Ion Calculated Amount    Sodium  80 mEq    Potassium  42 mEq    Calcium  5 mEq    Magnesium  18 mEq    Aluminum  --    Phosphate  15 mmol    Chloride  100 mEq    Acetate  --       Other    Total Protein  79.9 g    Total Protein/kg  1.95 g/kg     Glucose Infusion Rate  4.98 mg/kg/min    Osmolarity  1,539.75    Volume  1,675 mL    Rate  69.8 mL/hr    Dosing Weight  40.9 kg    Infusion Site  [facility]        Diet Orders          TPN ADULT ([facility]) at 69.8 mL/hr starting at 05/02 1800    TPN ADULT ([facility]) at 69.8 mL/hr starting at 05/01 1800    Diet NPO effective now Except for: except ice chips, except sips  of clears starting at 03/28 0959          TPN Associated Medications:    - GI Prophylaxis: famotidine   - Antiemetic: ondansetron PRN   - Bowel Regimen: none   - Insulin Regimen: sliding scale regular insulin     Recent Labs:      Lab 08/12/15  0521 08/08/15  0950   SODIUM 140 139   POTASSIUM 4.1 4.2   CHLORIDE 107 107   CO2 28 26   BUN 23 27*   CREATININE 0.45* 0.54*   GLUCOSE 93 97   CALCIUM 7.9* 8.2*   PHOSPHORUS 3.8 4.1   MAGNESIUM 1.6 1.8     Free calcium: 4.86 (goal 4.40 - 5.40 mg/dL)      Lab 41/32/44  0102 08/08/15  0950   ALK PHOS 152*  --    ALT 15  --    AST 14  --    BILIRUBIN TOTAL 0.2  --    BILIRUBIN DIRECT 0.11  --    TOTAL PROTEIN 4.5*  --    ALBUMIN <1.5*   <1.5* <1.5*         Lab 08/12/15  0521   TRIGLYCERIDES 115         Assessment and Plan:    - Macronutrients: formulation was increased on 4/17 and again on 4/21 as per dietary's recommendations. Continue same formulation from 4/21.  - Electrolytes: no new labs today. Magnesium was increased in TPN yesterday.  - Multivitamin/Trace Elements: provided daily in TPN.   - Glucose: has been WNL, no sliding scale insulin doses required since 4/14.  - Other additives: ascorbic acid 200 mg added to each bag considering GI output and wound healing.  - Check labs twice weekly: Monday/Thursday.    Thank you for the consult.    Kristie Cowman, PharmD  08/13/2015 9:17 AM

## 2015-08-13 NOTE — Unmapped (Signed)
Hospitalist Progress Note    08/13/2015     Linda Ponce   LOS: 36 days       HPI;    Patient is s/p OR on 05/31/2015 with [facility] for incision and drainage of complex abdominal wall abscess, rectal EUA, cystourethroscopy, bilateral retrograde pyelograms and removal old stent and placement of left 6x24 firm stent and right ureter embolization, right nephrostomy tube exchange, left nephrostomy tube placement and pelvic drain exchange on 06/06/2015 at a hospital in [place]. She was admitted to [facility] with severe sepsis secondary to peritonitis with stool coming from multiple abdominal wounds. She was seen by Critical Care, general surgery, and ID. General surgery recommended NPO and bowel rest and TPN has been started. She is receiving continued dressing changes and nephrostomy tube care. In addition, she was noted to have gangrene of the right foot and Dr. Donzetta Sprung performed angiogram, thrombectomy, and EIA stent on 06/27/15. Dr. Simeon Craft has evaluated and will follow as outpatient to consider transmetatarsal amputation at a later date. Patient is hemodynamically stable. She was  to [facility] and will need continued wound care, IV Vanc and Zosyn, and TPN.  She was sent back to [facility] on 07/05/2015  with abdominal pain and leukocytosis.CT scan showed multiple abdominal abscesses with large pelvic abscess . Her Hgb was 6.8 in ED and she was transfused 1 unit of blood.     Subjective & Interval History;    Came back from appointment today with wound care Dr. Hanley Ben.  Right foot is healing well.  P t looking at her baseline.  No new change in status.  Pain seems under reasonable control.  Sometime right nephrostomy tube leaks as per pt.  Denies any fever,chill, headache,N/V, CP ,SOB,Cough, Palpitation, diarrhea or constipation.  Denies any joint pain, skin rash.        Review of Systems:   as above    PMH: Unchanged from H&P.   PSH: Unchanged from H&P.   Family medical history: Unchanged  from H&P.   Social history: Unchanged from H&P      Scheduled Meds:  ??? clopidogrel  75 mg Per NG tube Daily 0900   ??? enoxaparin  40 mg Subcutaneous Daily 0900   ??? famotidine (PF)  20 mg Intravenous BID   ??? fentaNYL  1 patch Transdermal Q72H   ??? gabapentin  125 mg Oral 3 times per day   ??? insulin regular  0-5 Units Subcutaneous Daily 0900   ??? oxyCODONE  10 mg Oral 6x Daily     Continuous Infusions:  ??? TPN ADULT ([facility]) 69.8 mL/hr at 08/12/15 1719   ??? TPN ADULT ([facility])       PRN Meds:ALPRAZolam, alteplase, dextrose, dextrose 50 % in water (D50W), diclofenac sodium, heparin lock flush, HYDROmorphone, ipratropium-albuterol, LORazepam, ondansetron    Objective:    Vital signs in last 24 hours:  Temp:  [48.2 ??F (9 ??C)-98.1 ??F (36.7 ??C)] 98.1 ??F (36.7 ??C)  Heart Rate:  [116-124] 124  Resp:  [18] 18  BP: (100-109)/(51-66) 109/66 mmHg    Physical Examination:    GENERAL: The patient is awake,not in acute cardiorespiratory distress. Looking chronically sick.  HEENT:Atraumatic.Normocephalic. PERRLA, EOMI. No icterus, no pallor, no jugular venous distention..No ear or nose discharge.  NECK: Supple. Trachea is in midline. No Thyromegaly  CHEST: Symmetric. Clear to auscultation, bilateral equal air entry. No wheezing or rhonchi.  CARDIOVASCULAR: S1, S2, Regular pulse. No murmur or gallops. No JVD.  ABDOMEN:  Whole anterior abdomen is covered with pouch. Brownish fistula output noted.Bilateral nephrostomy tubes.  EXTREMITIES: There is + BLE edema . Very tender to touch both legs.  Right toes are gangrenous bur new tissue is growing.Old skin is sloughing.  Skin;No bruise,No rash.No petechiae.  CNS; CN II-XII grossly intact. No focal neurological deficit.       Intake/Output last 3 shifts:  I/O last 3 completed shifts:  In: 3759.2 [P.O.:420; I.V.:854]  Out: 4750 [Urine:2000; Drains:2750]    Recent Labs;                           Lab name 08/01/15  0621 08/05/15  0351 08/12/15  0521   WBC 8.3 6.6 6.9   HEMOGLOBIN 8.9* 8.1*  7.9*   HEMATOCRIT 27.0* 24.5* 24.6*   MEAN CORPUSCULAR VOLUME 91.9 93.6 94.4   PLATELETS 568* 576* 612*                                                            Lab name 08/05/15  0351 08/08/15  0950 08/12/15  0521   SODIUM 142 139 140   POTASSIUM 4.0 4.2 4.1   CREATININE 0.44* 0.54* 0.45*   BUN 24 27* 23   CHLORIDE 109 107 107   CO2 26 26 28    PHOSPHORUS 3.9 4.1 3.8   MAGNESIUM 1.7 1.8 1.6                         Lab name 07/26/15  0746 07/29/15  0409 08/05/15  0351 08/12/15  0521   AST 22 22 17 14    ALT 21 22 16 15    BILIRUBIN TOTAL 0.3 0.3 0.2 0.2       Lab name 07/05/15  0702  07/09/15  0544  08/05/15  0351 08/08/15  0950 08/12/15  0521   PREALBUMIN 14.0*  --  16.0*  --   --   --   --    ALBUMIN  --   < > 1.5*  < > <1.5*   <1.5* <1.5* <1.5*   <1.5*   < > = values in this interval not displayed.                                                                                                   Lab name 08/08/15  0950 08/12/15  0521   CALCIUM 8.2* 7.9*   PHOSPHORUS 4.1 3.8                              Lab name 08/05/15  0351 08/12/15  0521   TRIGLYCERIDES 107 115                        Lab name 08/05/15  0900 08/07/15  0850 08/10/15  0830   VANCOMYCIN TROUGH 9.3* 9.1* 17.4       Diet;    Diet Orders          TPN ADULT ([facility]) at 69.8 mL/hr starting at 05/02 1800    TPN ADULT ([facility]) at 69.8 mL/hr starting at 05/01 1800    Diet NPO effective now Except for: except ice chips, except sips of clears starting at 03/28 1610          Future Appointments:  No future appointments.    Assessment/Plan:      1. Peritoneal abscess- Mixed flora, pigtail drain 06/26/15 Refused followup CT, still requires one. Vanco/Zosyn/Diflucan.           -Completed  6 week Abx on 08/12/15.    2. Enterocutaneous Fistula- wound care, bowel rest. Still has significant output.    3. Severe intractable acute on chronic pain syndrome- receiving Dilaudid IV every 3 hr  Prn,Fentanyl patch decreased to 75 from 125  mcg. Started PO Oxycodone  scheduled as she had better response with it previously. Added Gabapentin as she was on previously.         Consulted Psychiatry for depression, chronic pain and recommended SNRIs to help with pain and depression.She want to try therapy first. Will decrease Fentanyl to 50 mcg tomorrow and increase Oxycodone to 15 mg      4. Thrombocytosis. uncertain etiology- follow closely. Likely reactive.    5. Cachexia-   continue TPN, recheck PAB. Albumin stays less than 1.5.    6. Sinus tachycardia. Likely related to pain. Monitor.    7. Ischemic feet/arterial thromboemboli- The patient had a clot in her femoral artery right leg that was cleared by vascular surgery.  However the patient ended up with necrosis at her distal digits which may be a combination of lack of blood flow and the use of pressors. Refusing heparin, aspirin and Plavix. Refused all heparin injections previously as well. Canceled aspirin but continue to encourage Plavix..           Following podiatrist at [facility].    8. History of colon cancer s/p exenteration with bilateral nephrostomy tubes    9. Recurrent presacral abscesses. Currently on broad spectrum IV Abx. ID following.    10. Debility- PT/OT    11. Guarded prognosis.     12. On TPN. 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 Cant tolerate PO. Weekly LFT while on TPN. TPN being managed by pharmacy. Electrolytes replacement through TPN. LFT are WNL so far. Mg level was low  Pharmacist increased the dose of Magnesium.    13. Seen by  Surgeon on 08/13/15.  Plan to get CT scan abdomen pelvis with PO contrast at [facility] on 5/10. Pt need to be NPO 4 hr before procedure.    14. D/C planning . on Wednesday has family meeting.           Prophylaxis;  GI Prophylaxis: Pepcid  DVT Prophylaxis: Lovenox     Code Status: Full Code      Milanna Kozlov MD.       08/13/2015

## 2015-08-13 NOTE — Unmapped (Signed)
Patient awake and tearful, worried that she will not be able to take Dilaudid dose before going to podiatry appointment this morning. Has been awake most of night, asking for IV Dilaudid before it is due, declines anxiety meds when offered, also has been refusing Neurontin because of taste.

## 2015-08-13 NOTE — Plan of Care (Signed)
NAME:  Building control surveyor  DATE OF BIRTH:  [birth year]  MEDICAL RECORD NUMBER:  [id number]  DATE:  08/13/2015    Dressing applied per orders.  Discharge instructions given.  Patient verbalized understanding.  Return to [facility] in 1 week.  Called/faxed orders to  [facility] [address] for CT abd  Ct Scan Abdomen ordered  Full wight bearing as recommended    ??? Atypical Ulcer       Nursing Diagnosis/Problem    '[x]'  Presence of Atypical Ulcer    '[x]'  Pain related to Atypical Ulcer    '[x]'  Knowledge deficit related to the diagnosis & management of Atypical ulcer    Goals    '[]'  Non invasive Arterial/Venous studies ordered       '[]'   Goal met          '[]'  Goal not met     '[x]'  Control of pain,exudate      '[x]'   Goal met          '[]'  Goal not met    '[x]'  Verbalize understanding of disease process      '[x]'   Goal met          '[]'  Goal not met     '[]'  Improved Oxygenation      '[]'   Goal met          '[]'  Goal not met      Nursing Interventions/Evaluation    '[x]'  At every visit assess Pain, exudate    '[x]'  Provide education on Atypical Ulcer    '[x]'  Assessment of ulcer each visit      Diagnostic/Treatment activity    '[]'  non-invasive arterial/Venous studies    '[]'  Bx/Pathology   '[]'  Cx  '[]'  Consult       '[]'  Debridement    I'[]'  Scheduled Angiogram       Electronically signed by Thea Silversmith, RN on 08/13/2015 at 10:01 AM

## 2015-08-13 NOTE — Progress Notes (Signed)
[place]  Progress Note       Erica Harding  AGE: 45 y.o.   GENDER: female  DOB: 01-17-1971  TODAY'S DATE:  08/13/2015    Subjective:     Chief Complaint   Patient presents with   ??? Wound Check     feet         HISTORY of PRESENT ILLNESS HPI     Erica Harding is a 45 y.o. female who presents today for wound evaluation.   History of Wound: Abdominal wounds and dry gangrene of the right foot    Wound Pain:  severe  Severity:  8 / 10   Wound Type:  non-healing surgical  Modifying Factors:  none  Associated Signs/Symptoms:  none        PAST MEDICAL HISTORY        Diagnosis Date   ??? Colorectal cancer ([place])        PAST SURGICAL HISTORY    Past Surgical History:   Procedure Laterality Date   ??? ABDOMEN SURGERY     ??? COLONOSCOPY     ??? HYSTERECTOMY         FAMILY HISTORY    History reviewed. No pertinent family history.    SOCIAL HISTORY    Social History   Substance Use Topics   ??? Smoking status: Never Smoker   ??? Smokeless tobacco: None   ??? Alcohol use No       ALLERGIES    Allergies   Allergen Reactions   ??? Latex Itching   ??? Compazine [Prochlorperazine Maleate] Swelling       MEDICATIONS    Current Outpatient Prescriptions on File Prior to Encounter   Medication Sig Dispense Refill   ??? vancomycin (VANCOCIN) 500 MG/100ML IVPB Infuse 150 mLs intravenously every 24 hours 2500 mL 0   ??? HYDROmorphone (DILAUDID) 1 MG/ML injection Infuse 1 mL intravenously every 2 hours as needed for Pain . (Patient taking differently: Infuse 1 mg intravenously every 3 hours  .) 1 mL 0   ??? acetaminophen (OFIRMEV) 10 MG/ML SOLN infusion Infuse 74.1 mLs intravenously every 6 hours as needed for Fever or Pain 4000 mL 0   ??? aspirin 81 MG chewable tablet 1 tablet by Per NG tube route daily 30 tablet 3   ??? LORazepam (ATIVAN) 2 MG/ML injection Infuse 0.5 mLs intravenously every 4 hours as needed (anxiety) 1 mL 0   ??? ipratropium-albuterol (DUONEB) 0.5-2.5 (3) MG/3ML SOLN nebulizer solution Inhale 3 mLs into the lungs every 4 hours as  needed for Shortness of Breath 360 mL 0   ??? glucose (GLUTOSE) 40 % GEL Take 15 g by mouth as needed (glc<70) 45 g 1   ??? insulin lispro (HUMALOG) 100 UNIT/ML pen Inject 0-6 Units into the skin every 4 hours 5 Pen 3   ??? ondansetron (ZOFRAN) 4 MG/2ML injection Infuse 2 mLs intravenously every 6 hours as needed for Nausea 15 mL 0   ??? fluconazole (DIFLUCAN) 200MG /100ML IVPB Infuse 100 mLs intravenously every 24 hours 100 mL 0   ??? magnesium sulfate 10-5 MG/ML-% Infuse 100 mLs intravenously as needed (Per IV Magnesium Replacement Protocol) 100 mL 0   ??? potassium chloride 20 MEQ/50ML IVPB Infuse 50 mLs intravenously as needed (Per IV Potassium Replacement Protocol) 50 mL 0   ??? clopidogrel (PLAVIX) 75 MG tablet 1 tablet by Per NG tube route daily 30 tablet 3   ??? fat emulsion 20 % infusion Infuse 250 mLs intravenously twice  a week 250 mL 0   ??? famotidine (PEPCID) 20 MG/2ML injection Infuse 2 mLs intravenously daily 2 mL 0     No current facility-administered medications on file prior to encounter.        REVIEW OF SYSTEMS    Pertinent items are noted in HPI.      Objective:      BP (!) 90/57   Pulse 112    PHYSICAL EXAM    General Appearance: alert and oriented to person, place and time, well-developed and well-nourished, in no acute distress  Skin: warm and dry, no rash or erythema  Head: normocephalic and atraumatic  Pulmonary/Chest: clear to auscultation bilaterally- no wheezes, rales or rhonchi, normal air movement, no respiratory distress  Abdomen with open wound in the midline as well as left flank region.  Both wounds are generally clean.  There is drainage consistent with enteric contents.  The patient has dry gangrene of the right first through fifth toes.  There is no evidence of infection    Assessment:     Patient Active Problem List   Diagnosis   ??? AKI (acute kidney injury) ([place])   ??? Hyperkalemia   ??? Open wnd ant abdomen-comp   ??? Peritonitis and retroperitoneal infections ([place])   ??? Fistula   ??? Ischemic foot   ???  Cachexia ([place])   ??? Postprocedural intra-abdominal sepsis ([place])   ??? Abscess   ??? Dehydration   ??? Encephalopathies   ??? Gangrene of foot ([place])   ??? Enterocutaneous fistula   ??? Abnormal CT of the chest   ??? Infiltrate noted on imaging study   ??? Open abdominal wall wound       Wound 06/25/15 Other (Comment) Abdomen Anterior;Mid midline abdominal surgical incision (Active)   Peri-wound Assessment Pink 08/06/2015  9:30 AM   Assessment Fragile 08/06/2015  9:30 AM   Number of days:48       Wound 06/25/15 Other (Comment) Abdomen Left;Lateral open incision to left lateral abdominal wall (Active)   Peri-wound Assessment Fragile 08/06/2015  9:30 AM   Assessment Fragile;Pink 08/06/2015  9:30 AM   Number of days:48       Wound 07/05/15 Other (Comment) Foot Right;Plantar and  5 toes on right foot (Active)   Peri-wound Assessment Black;Red 08/13/2015  8:44 AM   Assessment Black;Pink 08/13/2015  8:44 AM   Wound Length (cm) 3.7 cm 08/13/2015  8:44 AM   Wound Width (cm) 6.5 cm 08/13/2015  8:44 AM   Depth (cm)  0.1 08/13/2015  8:44 AM   Calculated Wound Size (cm^2) (l*w) 24.05 cm^2 08/13/2015  8:44 AM   Drainage Amount Moderate 08/13/2015  8:44 AM   Drainage Description Serosanguinous 08/13/2015  8:44 AM   Number of days:29       45 year old female with a history of colorectal cancer who has complicated abdominal wounds which include fistulas.  The abdominal wounds are decreasing in size and the fistula drainage is well controlled.  The patient has dry gangrene of the right first through fifth toes.    Plan:     Continue TPN and bowel rest for abdominal fistulas.  Will check CAT scan of the abdomen and pelvis to evaluate for residual pelvic abscess.  Continue to paint right first through fifth toes with Betadine paint.  The patient was encouraged to participate in physical therapy.  Follow-up in 2 weeks.    Discharge Treatment          Written Patient Discharge Instructions Given  Electronically signed by Elly Modena, MD on 08/13/2015 at 9:28  AM

## 2015-08-14 LAB — POC GLU MONITORING DEVICE: POC Glucose Monitoring Device: 110 mg/dL (ref 70–100)

## 2015-08-14 MED ORDER — TPN ADULT (WCH/Drake)
500 | INTRAMUSCULAR | Status: AC
Start: 2015-08-14 — End: 2015-08-15
  Administered 2015-08-14: 21:00:00 via INTRAVENOUS

## 2015-08-14 MED ORDER — fentaNYL (DURAGESIC) 50 mcg/hr 1 patch
50 | TRANSDERMAL | Status: AC
Start: 2015-08-14 — End: 2015-08-29
  Administered 2015-08-14 – 2015-08-26 (×6): 1 via TRANSDERMAL

## 2015-08-14 MED ORDER — ondansetron (ZOFRAN) 4 mg/2 mL injection 4 mg
4 | Freq: Four times a day (QID) | INTRAMUSCULAR | Status: AC | PRN
Start: 2015-08-14 — End: 2015-09-11
  Administered 2015-08-15 – 2015-09-11 (×36): 4 mg via INTRAVENOUS

## 2015-08-14 MED FILL — GABAPENTIN 250 MG/5 ML ORAL SOLUTION: 50 50 mg/mL | ORAL | Qty: 5

## 2015-08-14 MED FILL — HYDROMORPHONE 2 MG/ML INJECTION SYRINGE: 2 2 mg/mL | INTRAMUSCULAR | Qty: 2

## 2015-08-14 MED FILL — ONDANSETRON HCL (PF) 4 MG/2 ML INJECTION SOLUTION: 4 4 mg/2 mL | INTRAMUSCULAR | Qty: 2

## 2015-08-14 MED FILL — FAMOTIDINE (PF) 20 MG/2 ML INTRAVENOUS SOLUTION: 20 20 mg/2 mL | INTRAVENOUS | Qty: 2

## 2015-08-14 MED FILL — OXYCODONE 5 MG TABLET: 5 5 MG | ORAL | Qty: 2

## 2015-08-14 MED FILL — TRAVASOL 10 % INTRAVENOUS SOLUTION: 10 10 % | INTRAVENOUS | Qty: 799

## 2015-08-14 MED FILL — FENTANYL 50 MCG/HR TRANSDERMAL PATCH: 50 50 mcg/hr | TRANSDERMAL | Qty: 1

## 2015-08-14 MED FILL — CLOPIDOGREL 75 MG TABLET: 75 75 mg | ORAL | Qty: 1

## 2015-08-14 MED FILL — FENTANYL 75 MCG/HR TRANSDERMAL PATCH: 75 75 mcg/hr | TRANSDERMAL | Qty: 1

## 2015-08-14 NOTE — Unmapped (Signed)
Pt alert and  Oriented, vital signs stable, TPN running at 69.78mL/h, Dilaudid given for generalized pain, relief between pain medication given.

## 2015-08-14 NOTE — Unmapped (Signed)
Problem: Discharge Planning  Goal: Patient???s discharge needs are met  Collaborate with interdisciplinary team and initiate plans and interventions as needed.   Outcome: Progressing  Ongoing discharge planning.    Comments:   The case management department provided an update regarding patient???s plan of care today during Interdisciplinary Rounds.    With:  Nursing, Therapy, Dietician, SW, RNCM, Dr. Minerva Ends    Met with patient and sister and mom on speaker phone.  Remains on TPN - monitoring labs & weights.  Therapy is working with patient to get out of bed more.  Using knee immobilizers to try to stretch legs out.  This is painful and patient is resistent.  OT working on bathing and dressing.  CT of abdomen ordered to evaluate status of fistula's. Pain medications being adjusted.    Discharge recommendation is to SNF    Barriers to discharge at this time include: extensive wound care, IV medications

## 2015-08-14 NOTE — Unmapped (Signed)
Occupational Therapy  [facility]  Occupational Therapy      Name: Linda Ponce  DOB: [birth year]  Attending Physician: Valda Lamb, MD  Admission Diagnosis: jewish 07/05/15 dx: sepsis  Date: 08/14/2015    Reviewed Pertinent hospital course: Yes   Hospital Course PT/OT: Hospital Course PT/OT: 52f with history of colorectal CA, initially admitted to [facility] post operatively for continued wound care. Developed bleeding, sent back to [facility] where she was foundto have multiple pelvic abscesses, enterocutaneous fistulas, was kept NPO, started on IV abx, TPN, and returns for continued care. PRECAUTIONS: FULL CODE, AAT, NO WEIGHT BEARING RESTRICTIONS, ISCHEMIC R TOES, MULTIPLE DRAINS FROM ABDOMEN AND BACK, (updated 08/05/14-pt now NWB on B LEs x 2 weeks per Dr. Luan Pulling)       Brief Assessment  Assessment  Assessment: Decreased ADL status, Decreased self-care trans, Decreased Functional Mobility, Decreased activity tolerance  Prognosis: Guarded  Goal Formulation: Other (comment) (per therapist )    Goals  Pt Will demonstrate functional chair transfer: Moderate (via slliding board.)  Pt Will demonstrate grooming task: Stand by assist (at w/c level.)  Pt Will participate in upper extremity HEP to prep for ADLs: strengthening / activity tolerance for functional use: Ongoing   Miscellaneous Goal #1:  (Patient will tolerate sitting EOB with maximal assist by September 16, 2015)  Long Term Goal : Patient to be seen 2-3 week trial period to determine if she can participate and benefit from active OT programming - goal met, pt ability to participate is limited by anxiety and avoidance. New Goal: Pt to be Mod A with sliding board transfer  by September 16, 2015  Time frame for goals to be met in: By September 16, 2015    Recommendation  Plan  Treatment Interventions: Compensatory technique education, Therapeutic Activity, Patient/Family training, Excercise, ADL retraining, Functional transfer training, Activity Tolerance  training, Energy Conservation  OT Frequency: 1-3x/wk    Cognition  Overall Cognitive Status: Within Functional Limits    Pain  Pain Score:   8  Pain Location: Abdomen  Pain Descriptors: Aching  Pain Intervention(s): Medication (See eMAR)     Mobility  Bed Mobility  Rolling: Min assist to left;Min assist to right  Functional Transfers  Bed to Chair:  (Pt refused after multiple attempts due to nausea and pain )       ADL  Where Assessed: Supine, bed  Grooming Assistance: Modified independent (Device)  Grooming Deficit: Setup  Bathing Assistance: Stand by  Bathing Deficit: Setup;Increased time to complete   UE Dressing Assistance: Modified independent (Device)     Pt has met grooming/ ADL goal to date. Pt making fair progress toward goals limited by pain and nausea. MD present EOS and was discussing new pain regimen with patient in hopes to control for more participation with care. Pt refused out of bed this date but did request bathing and dressing which is usually not typical of patient. Pt less tearful and more cooperative this date. Will have OT look at POC to possibly upgrade goals if needed.     Cont per POC in colboration with OTR/L  [name], COTA/L    Patient Education:  Trying to push self forward with self care     Time  Start Time: 1305  Stop Time: 1355  Time Calculation (min): 50 min    Charges       $Therapeutic Activity: 8-22 mins  $Self Care/ADL/Home Management Training: 23-37 mins

## 2015-08-14 NOTE — Unmapped (Signed)
Assessment completed call light within reach continues tpn refusing repositioning

## 2015-08-14 NOTE — Unmapped (Signed)
Problem: Daily Care  Goal: Daily care needs are met  Assess and monitor ability to perform self care and identify potential discharge needs.   Intervention: Assess skin integrity/risk for skin breakdown and implement skin integrity plan of care and interventions per policy  .  Intervention: Assist with ADLs as needed  .  Intervention: Encourage independent activity per ability  .

## 2015-08-14 NOTE — Unmapped (Signed)
Pharmacy Nutrition Support Service    Linda Ponce is a 45 y.o. female started on TPN. Pharmacy consulted for monitoring and adjustment.    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    Pt Weights: ??????40.9 kg (08/11/15)  ????????????????????????????????????????????????40.8 kg (08/03/15)  ????????????????????????????????????????????????38.7 kg (07/31/15)  ????????????????????????????????????????????????39.8 kg (07/27/15)  ????????????????????????????????????????????????42.8 kg (07/21/15)    Estimated Nutritional Needs: Per Dietary on 07/09/15  ????????????????????????Based on Weight 45.6 kg  ????????????????????????Amino Acids: 55-65 grams (1.2-1.4 grams/kg/day)  ????????????????????????Fluid: 1550 mL/day  ????????????????????????Total Daily Calories: 1500-1565 kcal (33-34 kcal/kg/day)    Current TPN Formula (further increased on 4/21)  ????????????????????????Based on current weight of 40.9 g  ????????????????????????Amino Acids: 47.7 g/L (80 grams, 1.96 grams/kg/day, 320 kcal)  ????????????????????????Dextrose: 175 g/L (293 grams, 997 kcal)  ????????????????????????Lipids: 21 g/L (35.2 grams, 352 kcal)  ????????????????????????Total Daily Calories: 1669 kcal (41 kcal/kg/day)  ????????????????????????Volume: 1675 mL  ????????????????????????Rate: 69.8 mL/h     TPN Associated Medications:   ????????????????????????- GI Prophylaxis: famotidine  ????????????????????????- Antiemetic: ondansetron PRN  ????????????????????????- Bowel Regimen: none  ????????????????????????- Insulin Regimen: sliding scale regular insulin    Assessment and Plan:    - Macronutrients: formulation was increased on 4/17 and again on 4/21 as per dietary's recommendations. Continue same formulation from 4/21.  - Electrolytes: no new labs today.   - Multivitamin/Trace Elements: provided daily in TPN. ??  - Glucose: has been WNL, no sliding scale insulin doses required since 4/14.  - Other additives: ascorbic acid 200 mg added to each bag considering GI output and wound healing.  - Check labs twice weekly: Monday/Thursday.    Thank you for the consult.    RACHEL COOK, Pharm.D.    08/14/2015 7:34 AM  Phone: [phone]

## 2015-08-14 NOTE — Unmapped (Signed)
Hospitalist Progress Note    08/14/2015     Linda Ponce   LOS: 37 days       HPI;    Patient is s/p OR on 05/31/2015 with [facility] for incision and drainage of complex abdominal wall abscess, rectal EUA, cystourethroscopy, bilateral retrograde pyelograms and removal old stent and placement of left 6x24 firm stent and right ureter embolization, right nephrostomy tube exchange, left nephrostomy tube placement and pelvic drain exchange on 06/06/2015 at a hospital in [place]. She was admitted to [facility] with severe sepsis secondary to peritonitis with stool coming from multiple abdominal wounds. She was seen by Critical Care, general surgery, and ID. General surgery recommended NPO and bowel rest and TPN has been started. She is receiving continued dressing changes and nephrostomy tube care. In addition, she was noted to have gangrene of the right foot and Dr. Donzetta Sprung performed angiogram, thrombectomy, and EIA stent on 06/27/15. Dr. Simeon Craft has evaluated and will follow as outpatient to consider transmetatarsal amputation at a later date. Patient is hemodynamically stable. She was  to [facility] and will need continued wound care, IV Vanc and Zosyn, and TPN.  She was sent back to [facility] on 07/05/2015  with abdominal pain and leukocytosis.CT scan showed multiple abdominal abscesses with large pelvic abscess . Her Hgb was 6.8 in ED and she was transfused 1 unit of blood.     Subjective & Interval History;    Pain is under better control today.  Feeling more nauseous after oxycodone.   Able to work with PT/OT.  Wound Lucretia Field has significant output.  +Anxiety.  No fever or chills.      Review of Systems:  10 point ROS was negative except as above.       PMH: Unchanged from H&P.   PSH: Unchanged from H&P.   Family medical history: Unchanged from H&P.   Social history: Unchanged from H&P      Scheduled Meds:  ??? clopidogrel  75 mg Per NG tube Daily 0900   ??? enoxaparin  40 mg Subcutaneous Daily  0900   ??? famotidine (PF)  20 mg Intravenous BID   ??? fentaNYL  1 patch Transdermal Q72H   ??? gabapentin  125 mg Oral 3 times per day   ??? insulin regular  0-5 Units Subcutaneous Daily 0900   ??? oxyCODONE  10 mg Oral 6x Daily     Continuous Infusions:  ??? TPN ADULT ([facility]) 69.8 mL/hr at 08/13/15 1725   ??? TPN ADULT ([facility])       PRN Meds:ALPRAZolam, alteplase, dextrose, dextrose 50 % in water (D50W), diclofenac sodium, heparin lock flush, HYDROmorphone, ipratropium-albuterol, LORazepam, ondansetron    Objective:    Vital signs in last 24 hours:  Temp:  [97.8 ??F (36.6 ??C)-98.3 ??F (36.8 ??C)] 97.8 ??F (36.6 ??C)  Heart Rate:  [111-113] 112  Resp:  [17-22] 22  BP: (99-112)/(56-78) 99/56 mmHg    Physical Examination:    GENERAL: The patient is awake,not in acute cardiorespiratory distress. Looking chronically sick.  HEENT:Atraumatic.Normocephalic. PERRLA, EOMI. No icterus, no pallor, no jugular venous distention..No ear or nose discharge.  NECK: Supple. Trachea is in midline. No Thyromegaly  CHEST: Symmetric. Clear to auscultation, bilateral equal air entry. No wheezing or rhonchi.  CARDIOVASCULAR: S1, S2, Regular pulse. No murmur or gallops. No JVD.  ABDOMEN: Whole anterior abdomen is covered with pouch. Brownish fistula output noted.Bilateral nephrostomy tubes.  EXTREMITIES: There is + BLE edema . Very tender to  touch both legs.  Right foot has dressing in place. Could not examine.  Skin;No bruise,No rash.No petechiae.  CNS; CN II-XII grossly intact. No focal neurological deficit.       Intake/Output last 3 shifts:  I/O last 3 completed shifts:  In: 3877.3 [P.O.:1260; I.V.:1424]  Out: 1100 [Urine:450; Drains:650]    Recent Labs;                           Lab name 08/01/15  0621 08/05/15  0351 08/12/15  0521   WBC 8.3 6.6 6.9   HEMOGLOBIN 8.9* 8.1* 7.9*   HEMATOCRIT 27.0* 24.5* 24.6*   MEAN CORPUSCULAR VOLUME 91.9 93.6 94.4   PLATELETS 568* 576* 612*                                                            Lab name  08/05/15  0351 08/08/15  0950 08/12/15  0521   SODIUM 142 139 140   POTASSIUM 4.0 4.2 4.1   CREATININE 0.44* 0.54* 0.45*   BUN 24 27* 23   CHLORIDE 109 107 107   CO2 26 26 28    PHOSPHORUS 3.9 4.1 3.8   MAGNESIUM 1.7 1.8 1.6                         Lab name 07/26/15  0746 07/29/15  0409 08/05/15  0351 08/12/15  0521   AST 22 22 17 14    ALT 21 22 16 15    BILIRUBIN TOTAL 0.3 0.3 0.2 0.2       Lab name 07/05/15  0702  07/09/15  0544  08/05/15  0351 08/08/15  0950 08/12/15  0521   PREALBUMIN 14.0*  --  16.0*  --   --   --   --    ALBUMIN  --   < > 1.5*  < > <1.5*   <1.5* <1.5* <1.5*   <1.5*   < > = values in this interval not displayed.                                                                                                   Lab name 08/08/15  0950 08/12/15  0521   CALCIUM 8.2* 7.9*   PHOSPHORUS 4.1 3.8                              Lab name 08/05/15  0351 08/12/15  0521   TRIGLYCERIDES 107 115                        Lab name 08/05/15  0900 08/07/15  0850 08/10/15  0830   VANCOMYCIN TROUGH 9.3* 9.1* 17.4       Diet;    Diet Orders  TPN ADULT ([facility]) at 69.8 mL/hr starting at 05/03 1800    TPN ADULT ([facility]) at 69.8 mL/hr starting at 05/02 1800    Diet NPO effective now Except for: except ice chips, except sips of clears starting at 03/28 8657          Future Appointments:  No future appointments.    Assessment/Plan:      1. Peritoneal abscess- Mixed flora, pigtail drain 06/26/15 Refused followup CT, still requires one. Vanco/Zosyn/Diflucan.           -Completed  6 week Abx on 08/12/15.    2. Enterocutaneous Fistula- wound care, bowel rest. Still has significant output.    3. Severe intractable acute on chronic pain syndrome- receiving Dilaudid IV every 3 hr  Prn,Fentanyl patch decreased to 75 from 125  mcg. Started PO Oxycodone scheduled as she had better response with it previously. Added Gabapentin as she was on previously.         Consulted Psychiatry for depression, chronic pain and recommended  SNRIs to help with pain and depression.She want to try therapy first. Will decrease Fentanyl to 50 mcg tomorrow and increase Oxycodone to 15 mg      4. Thrombocytosis. uncertain etiology- follow closely. Likely reactive.    5. Cachexia-   continue TPN, recheck PAB. Albumin stays less than 1.5.    6. Sinus tachycardia. Likely related to pain. Monitor.    7. Ischemic feet/arterial thromboemboli- The patient had a clot in her femoral artery right leg that was cleared by vascular surgery.  However the patient ended up with necrosis at her distal digits which may be a combination of lack of blood flow and the use of pressors. Refusing heparin, aspirin and Plavix. Refused all heparin injections previously as well. Canceled aspirin but continue to encourage Plavix..           Following podiatrist at [facility].    8. History of colon cancer s/p exenteration with bilateral nephrostomy tubes    9. Recurrent presacral abscesses. Currently on broad spectrum IV Abx. ID following.    10. Debility- PT/OT    11. Guarded prognosis.     12. On TPN. 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 Cant tolerate PO. Weekly LFT while on TPN. TPN being managed by pharmacy. Electrolytes replacement through TPN. LFT are WNL so far. Mg level was low  Pharmacist increased the dose of Magnesium. Will repeat level tomorrow.    13. Seen by  Surgeon on 08/13/15.  Plan to get CT scan abdomen pelvis with PO contrast at [facility] on 5/10. Pt need to be NPO 4 hr before procedure.    14. D/C planning . Family meeting today. Will d/w CM for further planning. Time to move to more rehab. Done with Abx.  15. Nausea. Increased frequency of Zofran.          Prophylaxis;  GI Prophylaxis: Pepcid  DVT Prophylaxis: Lovenox     Code Status: Full Code      Desiree Daise MD.       08/14/2015

## 2015-08-14 NOTE — Unmapped (Signed)
Problem: Knowledge Deficit  Goal: Patient/family/caregiver demonstrates understanding of disease process, treatment plan, medications, and discharge instructions  Complete learning assessment and assess knowledge base.   Educate to mobility and decreasing pain

## 2015-08-14 NOTE — Unmapped (Addendum)
[facility] Inpatient Medical Psychology Service  [facility] Department of Neurology & Rehabilitation Medicine  Division of Neuropsychology & Medical Psychology     INPATIENT MEDICAL PSYCHOLOGY PROGRESS NOTE    Patient Name:  Linda Ponce          DOB:  Nov 24, 1970  Admit Date:  07/08/2015   CSN:  [id number]    Referring Provider:  Valda Lamb, MD  Location:  [name] Room N319/DN319-02     Current Date:  08/14/2015  Time and Duration of Service:  2:35p-4p  = 85 minutes face to face time     CPT Code:  09811 - Health & Behavior Intervention - Family and patient - 6 units, 85 minutes face to face time    Current Problem List:      Patient Active Problem List   Diagnosis   ??? Abdominal abscess   ??? Enterocutaneous fistula   ??? Malnutrition   ??? Fistula   ??? Adjustment disorder with mixed anxiety and depressed mood   ??? Pain of multiple sites   ??? Cognitive dysfunction-recent onset.       Current Medications:     Scheduled Medications:    ??? clopidogrel  75 mg Per NG tube Daily 0900   ??? enoxaparin  40 mg Subcutaneous Daily 0900   ??? famotidine (PF)  20 mg Intravenous BID   ??? fentaNYL  1 patch Transdermal Q72H   ??? gabapentin  125 mg Oral 3 times per day   ??? insulin regular  0-5 Units Subcutaneous Daily 0900   ??? oxyCODONE  10 mg Oral 6x Daily     PRN Medications:  ALPRAZolam, alteplase, dextrose, dextrose 50 % in water (D50W), diclofenac sodium, heparin lock flush, HYDROmorphone, ipratropium-albuterol, LORazepam, ondansetron    Objective Presentation/Subjective Concerns and Related Interventions:      ?? Sister was present and mother on the phone; CM, PT, RN, and RD were present for first 25 min (team-family meeting) in which I also participated.  ?? Intervention: Educated team and family about my work with her as it is addressing relevant aspects of her pain control issues, coping difficulties, and therapy participation and the recent changes in pain related treatment which has  resulted in improvements this past week or so.  ?? Afterward, an hour was spent working with Linda Ponce with her sister present on debriefing her regarding the need expressed by therapy and CM to increase her efforts during therapies and willingness for legs to be repositioned and braces to be worn to reduce left leg contracture and sitting tolerance.  ?? Interventions:   ?? Support, education, and encouragement to strongly consider a trial of antidepressant to address her repeated complaints of being too emotionally tired to push herself through pain during therapy and having to work so hard to cope with pain and fatigue in general (she avoids sleep so as to not miss prn dilaudid dose).    ?? Introduced Linda Ponce certified accountant as she anticipates pain of therapy to be as bad as it was in her immediate post-op period which is not true now.   ?? Reviewed gate control theory and the role of emotions in pain perception.   ?? Also answered questions about getting POA paperwork (healthcare and financial) done so sister can manage her affairs. Process was described and CM notified of desire to do it this Friday if possible. I have no concerns about Trinty's capacity to understand the POA paperwork. She  needs to review it before the notary comes to sign it.    Psychiatric Diagnoses:      ?? Cognitive dysfunction--recent onset.    ?? Likely residual of significant delirium at [facility], pain medications, distraction from pain itself, and possibly IV ABX. Will monotor. Primarily involves orientation to time, and somewhat to place, short term recall, and working memory. Expected to improve as pt's wounds and infections improve.  ?? MMSE-2 score of 21/20 (T=21) indicates moderate cognitive impairment involving orientation, short term recall, and working memory.   ?? Clinical assessment of common sense reasoning skills indicated normal limits reasoning skills except when her pain gets severe, at which point she  becomes fearful and suspicious about medications being withheld because she bother's the nurses too much.    ?? Pain of Multiple Sites.    ?? 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 Pt reports severe pain ranging from 8-10/10 even with fentanyl 125 mcg patch. Dilaudid IV q3h prn reduces it to 5/10 but does not last.  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 No history of narcotic abuse, but she has been on narcotics for abdominal and radiation pain from 2011 cancer surgery and complications x 6 yrs. Best regimen per pt post op at [facility] was oxycodone 15 mg q4h scheduled and dilaudid 1 mg q3h prn which she tapered down before discharge.She also took neurontin 300 mg TID when home prior to surgery for neuropathic pain (low back).  Psychosocial losses (death of father 1 week before surgery, missing teen son who is in [place] w/her mother) reduce ability to cope with pain and hospitalization.     ?? Adjustment disorder with mixed anxiety and depressed mood.  ?? Related to prolonged hospitalization and inadequately controlled pain in abdomen, back, and legs.  ?? She has had NO prior treatment for anxiety or depression or substance use disorders.       RECOMMENDATIONS & PLAN:     DR SHAHID/ATTENDING MD:    1)  Thank you for reducing the fentanyl. Would you please increase the oxycodone to 15 mg as scheduled to compensate? If you are concerned about total narcotic load, I am concerned that the dilaudid 4 mg may be adding to her nausea, and have recommended reducing it to 3 mg to help this to Linda Ponce, but she is resistant. She is psychologically overly dependent on it, but will need to be tapered off of it soon anyway to prep for discharge to SNF as her IV ABX will be done soon per  CM. I believe that she is more fearful of pain when prn dilaudid wears off, and would do better moving toward mostly scheduled meds anyway. Your call.      2)  I am talking to her about Remeron for depression, anxiety, and improving pain tolerance. She has been well educated about it, and really needs it, but is reluctant. If you agree that it would be appropriate for her, your encouragement may help. (Psychiatry suggested effexor, but it does not come in a Sol-tab form. I'm not sure about liquid.)    MEDICAL PSYCHOLOGY PLAN:     ?? I will continue to educate her about the benefits of an antidepressant in her case, and engage her in CBT for depression  and coping with pain her complex medical problems.   ?? Pt agreed to look at the Apps, and to take her neurontin.      Adhvik Canady P. Minerva Ends, PhD  Licensed Insurance risk surveyor in Medical Psychology  [place] Department of Neurology & Rehabilitation Medicine  Office Phone for non-urgent messages/requests:  [phone]  Urgent issues-pager: [phone]    08/14/2015

## 2015-08-14 NOTE — Unmapped (Signed)
Physical Therapy                                              Physical Therapy Treatment     Name: Linda Ponce  DOB: 21-Apr-1970  Attending Physician: Valda Lamb, MD  Admission Diagnosis: jewish 07/05/15 dx: sepsis  Date: 08/14/2015    Reviewed Pertinent hospital course: Yes  Hospital Course PT/OT: Hospital Course PT/OT: 29f with history of colorectal CA, initially admitted to [facility] post operatively for continued wound care. Developed bleeding, sent back to [facility] where she was foundto have multiple pelvic abscesses, enterocutaneous fistulas, was kept NPO, started on IV abx, TPN, and returns for continued care. PRECAUTIONS: FULL CODE, AAT, NO WEIGHT BEARING RESTRICTIONS, ISCHEMIC R TOES, MULTIPLE DRAINS FROM ABDOMEN AND BACK, (updated 08/05/14-pt now NWB on B LEs x 2 weeks per Dr. Luan Pulling)     Assessment     Assessment: Impaired Bed Mobility, Impaired Transfer Mobility, Impaired Ambulation, Impaired Balance, Deconditioning, Impaired Strength, Impaired ROM  Prognosis: Guarded (due to multiple medical issues )    Goals  Pt Will Go Supine To Sit: Minimal (to get in and out of bed within 4 weeks. )  Sit To Stand: Min A with LRAD to prepare for gait and transfers within 6 weeks.   Pt Will Transfer Bed/Chair: Minimal (with LRAD to increase independence in home within 6 weeks. )  Pt Will Ambulate: Minimal (25' with RW to access home within 6 weeks. )  Miscellaneous Goal #1: Pt will propel w/c 150' with B UEs with supervision to increase independence in home and community within 6 weeks.   Long Term Goal : Pt will be independent with HEP to improve B LE strength to facilitate safe mobility within 8 weeks.   Time frame for goals to be met in: 8 weeks, 09/14/15    Recommendation  Plan  Treatment/Interventions: LE strengthening/ROM, Endurance training, Patient/family training, Equipment eval/education, Museum/gallery curator, Therapeutic Exercise, Therapeutic Activity, Continued evaluation, Wheelchair Mobility, Compensatory technique  education, Neuromuscular Reeducation, Gait training  PT Frequency:  (2-4x/wk )    Recommendation  Recommendation: Short-term skilled PT  Equipment Recommended: Defer at this time    Problem List  Patient Active Problem List   Diagnosis   ??? Abdominal abscess   ??? Enterocutaneous fistula   ??? Malnutrition   ??? Fistula   ??? Adjustment disorder with mixed anxiety and depressed mood   ??? Pain of multiple sites   ??? Cognitive dysfunction-recent onset.        Past Medical History  Past Medical History   Diagnosis Date   ??? Cancer    ??? Abscess of abdominal cavity    ??? Gangrene of foot    ??? AKI (acute kidney injury)    ??? Fistula    ??? Peritonitis and retroperitoneal infections    ??? Cachexia    ??? Hyperkalemia    ??? Dehydration    ??? Encephalopathies    ??? Ischemic foot    ??? 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 Dilaudid IV q3h prn reduces it to 5/10 but does not last.  Pt seems to be a fentanyl non-responder, and  may need alternate narcotic. No hist        Past Surgical History  Past Surgical History   Procedure Laterality Date   ??? Colon surgery         Cognition:  Overall Cognitive Status: Within Functional Limits  Orientation Level: Oriented X4     Pain:  Pain Score:  (Pt did not rate pain during session but c/o pain in B LEs during stretching and touching )  Pain Location: Leg (B LEs)  Pain Descriptors: Burning  Pain Intervention(s): Rest;Repositioned  Therapist reported pain to:: Nsg provided pain meds right before tx session          Mobility:  Pt declined mobility at this time as pt anticipated getting OOB during OT in preparation for family meeting scheduled at 2:30p.m. Pt stated if she got up now it would be too long.       Exercise:  Supine  Supine-Exercises: Lower  extremity;Specific exercises  Supine-Exercise Type: Ankle pumps;ABduction/ADduction;Heel slides  Supine-Exercise Comments: B LEs as stated above with hip IR/ER x 15 reps AAROM with min A with passive overpressure with knee ext and L DF. B passive knee ext stretch in B knee immobilizers x 10 mins. Pt tolerated well. Pt positioned without pillows under knees but only with pillow between legs.        Family meeting held 14:35-15:10 with this physical therapist, pt, pt's sister (April),CM, SW, Dietician, Museum/gallery curator, Consulting civil engineer (also pt's nurse for today), and pt's mother via phone. Pt's current level of functioning, POC and goals were discussed. Pt's current weight bearing status discussed per wound care team at [facility]- FWB L LE, Heel wt bearing on R LE. Importance of pt positioning with B LEs in neutral to decrease L knee contracture and decrease risk of R foot drop and B hip contractures discussed at length. Discharge plans discussed at this time and pt still able to meet LTAC criteria but may have to go to SNF vs home if activity level does not increase. Pt and pt's family demo verbal understanding.     Patient Education  Proper positioning of B LEs, importance of OOB  Time  Start Time: 1143  Stop Time: 1221 (2nd session for family meeting 14:35-13:10 (billed x 1 unit ) )  Time Calculation (min): 38 min    Charges       $Therapeutic Exercise: 38 mins  $Therapeutic Activity: 15 mins    Rulon Abide, PT, DPT [phone]

## 2015-08-15 LAB — RENAL FUNCTION PANEL W/EGFR
Albumin: <1.5 g/dL (ref 3.5–5.7)
Anion Gap: 6 mmol/L (ref 3–16)
BUN: 26 mg/dL (ref 7–25)
CO2: 28 mmol/L (ref 21–33)
Calcium: 7.9 mg/dL (ref 8.6–10.3)
Chloride: 104 mmol/L (ref 98–110)
Creatinine: 0.4 mg/dL (ref 0.60–1.30)
Glucose: 76 mg/dL (ref 70–100)
Osmolality, Calculated: 290 mOsm/kg (ref 278–305)
Phosphorus: 4.3 mg/dL (ref 2.1–4.7)
Potassium: 4.5 mmol/L (ref 3.5–5.3)
Sodium: 138 mmol/L (ref 133–146)
eGFR AA CKD-EPI: >90 See note.
eGFR NONAA CKD-EPI: >90 See note.

## 2015-08-15 LAB — CBC
Hematocrit: 26.2 % (ref 35.0–45.0)
Hemoglobin: 8.6 g/dL (ref 11.7–15.5)
MCH: 30.6 pg (ref 27.0–33.0)
MCHC: 32.9 g/dL (ref 32.0–36.0)
MCV: 93 fL (ref 80.0–100.0)
MPV: 6.5 fL (ref 7.5–11.5)
Platelets: 663 10*3/uL (ref 140–400)
RBC: 2.81 10*6/uL (ref 3.80–5.10)
RDW: 18 % (ref 11.0–15.0)
WBC: 6.9 10*3/uL (ref 3.8–10.8)

## 2015-08-15 LAB — POC GLU MONITORING DEVICE: POC Glucose Monitoring Device: 111 mg/dL (ref 70–100)

## 2015-08-15 LAB — MAGNESIUM: Magnesium: 1.8 mg/dL (ref 1.5–2.5)

## 2015-08-15 MED ORDER — TPN ADULT (WCH/Drake)
10 | INTRAVENOUS | Status: AC
Start: 2015-08-15 — End: 2015-08-16
  Administered 2015-08-15: 22:00:00 via INTRAVENOUS

## 2015-08-15 MED FILL — HYDROMORPHONE 2 MG/ML INJECTION SYRINGE: 2 2 mg/mL | INTRAMUSCULAR | Qty: 2

## 2015-08-15 MED FILL — CLOPIDOGREL 75 MG TABLET: 75 75 mg | ORAL | Qty: 1

## 2015-08-15 MED FILL — GABAPENTIN 250 MG/5 ML ORAL SOLUTION: 50 50 mg/mL | ORAL | Qty: 5

## 2015-08-15 MED FILL — ONDANSETRON HCL (PF) 4 MG/2 ML INJECTION SOLUTION: 4 4 mg/2 mL | INTRAMUSCULAR | Qty: 2

## 2015-08-15 MED FILL — FAMOTIDINE (PF) 20 MG/2 ML INTRAVENOUS SOLUTION: 20 20 mg/2 mL | INTRAVENOUS | Qty: 2

## 2015-08-15 MED FILL — OXYCODONE 5 MG TABLET: 5 5 MG | ORAL | Qty: 2

## 2015-08-15 MED FILL — ENOXAPARIN 40 MG/0.4 ML SUBCUTANEOUS SYRINGE: 40 40 mg/0.4 mL | SUBCUTANEOUS | Qty: 0.4

## 2015-08-15 MED FILL — TRAVASOL 10 % INTRAVENOUS SOLUTION: 10 10 % | INTRAVENOUS | Qty: 799

## 2015-08-15 NOTE — Unmapped (Signed)
Problem: Daily Care  Goal: Daily care needs are met  Assess and monitor ability to perform self care and identify potential discharge needs.   Outcome: Progressing  Pt needs encouragement to provide/participate in self care.

## 2015-08-15 NOTE — Unmapped (Addendum)
Nursing Clinical Progress Note        Patient Active Problem List    Diagnosis Date Noted   ??? Adjustment disorder with mixed anxiety and depressed mood 08/06/2015   ??? Pain of multiple sites 08/06/2015   ??? Cognitive dysfunction-recent onset. 08/06/2015   ??? 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 Malnutrition 07/08/2015   ??? Fistula 07/08/2015   ??? Abdominal abscess 07/04/2015       Filed Vitals:    08/13/15 2012 08/14/15 0631 08/14/15 1302 08/15/15 0500   BP: 112/78 99/56 99/56  98/56   Pulse: 113 111 112 50   Temp:  98.3 ??F (36.8 ??C) 97.8 ??F (36.6 ??C) 98.5 ??F (36.9 ??C)   TempSrc:  Oral Oral    Resp: 17 19 22 19    Height:       Weight:       SpO2: 98% 100% 100% 98%       Lines/Drains/Access - Yes   If yes, please list  r ij; nephrostomy tubes, foley,     Does the patient have any changes to their condition warranting intervention? No       RN Shift Note Additional: pt is in no distress, respirations easy and unlabored w/skin warm, pink and dry.  Pt able to make needs known.     Does the patient's have any transfers/appointments or testing scheduled -   No future appointments.    Patient/Family questions - No    Mcarthur Rossetti, RN    Pt has refused all oral medications today d/t fear of choking on medications.  Offered crushing meds and giving them in juice or apple sauce, and pt declines.   Noted rt nephrostomy tube is extended approx 4 cm from insertion site to disc, with insertion site approx 1 cm in diameter.  Oozing small amount of urine, changed dressing. Noted drain bag continues to collect urine, clear yellow and without odor.  Dr. Cloretta Ned made aware.

## 2015-08-15 NOTE — Unmapped (Signed)
[facility]   Wound Care Assessment  08/15/2015       [facility] is an 45 y.o. female.  DOB: [birth year]    Pressure ulcers on admission:  One STAGE: III on coccyx              One UNSTAGEABLE on right lateral hip    Diagnosis: jewish 07/05/15 dx: sepsis    Allergies   Allergen Reactions   ??? Compazine [Prochlorperazine Edisylate]    ??? Latex, Natural Rubber        History:    Past Medical History   Diagnosis Date   ??? Cancer    ??? Abscess of abdominal cavity    ??? Gangrene of foot    ??? AKI (acute kidney injury)    ??? Fistula    ??? Peritonitis and retroperitoneal infections    ??? Cachexia    ??? Hyperkalemia    ??? Dehydration    ??? Encephalopathies    ??? Ischemic foot    ??? 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 Dilaudid IV q3h prn reduces it to 5/10 but does not last.  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 No hist     Past Surgical History   Procedure Laterality Date   ??? Colon surgery       Social History   Substance Use Topics   ??? Smoking status: Never Smoker    ??? Smokeless tobacco: None   ??? Alcohol Use: None     Present admission history of illness/MD note.  42f with history of colorectal CA, initially admitted to [facility] post operatively for continued wound care. Developed bleeding, sent back to [facility] where she was foundto have multiple pelvic abscesses, enterocutaneous fistulas, was kept NPO, started on IV abx, TPN, and returns for continued care.    Skin/Wound Care Assessment.    WCT assessed mid abdomen fistula, left abdomen fistula, left groin drain, right lower abd colostomy, appliance changes done to all of these areas, with patient assisting with care.  Patient was premedicated,does c/o discomfort with care, takes  long time for patient to remove and clean areas and then have pouches reapplied. Does become very anxious with care and tearful. Patient does help but does not appear that she could do entire pouch change without assistance. Due to her anxiety requires additional time for care. Took 1 1/2 hours for pouch changes to the four areas listed. Wound care team deferred assessment for today of left foot, coccyx and bilateral nephrostomy tubes. Informed nurse Golda Acre of care that was provided and drsg changes not done today by [facility]. Will attempt to assess these areas within next 2 days.       Braden: 18     Pressure wounds:    Coccyx STAGE:???? III??   Wounds progress: RESOLVED  Wound Assessment:??Newly pithelialized, blanchable erythema. Wound team will assess again next week, continue current tx to prevent reopening of area. Treatment/Dressing: Clean with normal saline, pat dry. Apply  Mepilex border. Change every 3 days and prn dressing failure.      _____________________________________________________________________  Surgical wounds:????????????????????????????????????????????????????????????    Mid Abdominal Wound with fistula???? Full Thickness. Measurements: 8 cm L x 3.5 cm W x  1 cm D    Wounds progress:??improved  Wound Assessment:?? Wound bed with moist bright pink tissue, granulation tissue with areas of mucosal/bowel tissue.  Well defined edges, with some epithelial tissue noted at edges Large amount liquid gold brown effluent draining into foley bag. Periwound skin clean and  intact. Pouch has stayed intact approximately 7 days wear time, has been leaking since sometime earlier this morning, appears to have leaked into and also loosened small pouches of colostomy and  left groin drainage tube. .  Treatment/Dressing: Mid abdominal and left abdominal fistula/wounds being managed with Eakins pouches attached to gravity drainage bags. Clean wounds and surrounding skin with soap and water, pat dry. Attach Eakins pouch using Eakins rings to help with  adherence. Use pouch [id number] for mid abdomen and smaller pouch [phone] for left abdomen. Change pouches weekly and prn for leakages.      ____________________________________________________________________  Surgical wounds:??????  ??????  Left Lateral Abdomen Fistula???? Full Thickness. Measurements L 2.5?? cm X W 1 cm X?? D 1 cm    Wounds progress: improved, much smaller in size. No obvious drainage  Wound Assessment:??Moist pink tissue, edges with granulation and epithelial tissue. Is smaller in size, No effluent is noted, appears mid abdominal fistula has all of drainage, none from this fistula. ?? Periwound skin clean and intact.?? This pouch has been intact for approximately 7 days. Intact but has large amount of fisutula drainage on it from mid abdominal fistula leaking on it.  t  Treatment/Dressing:?? Patient assisted with pouch removal and skin cleaning. Left abdominal fistula/wound being managed with Eakins pouch attach to gravity drainage bags. Clean wounds and surrounding skin with soap and water, pat dry. Attach Eakins pouch using Eakins rings to help with adherence. Used smaller pouch [id number] today  for left abdomen. Change pouches weekly and prn for leakages.       _____________________________________________________________________  Surgical wounds:????  ????????  Drain of left groin ?? ??  Wounds progress:????No change??  Wound Assessment: Drain intact, sutured in place, but is no longer connected to skin. .No drainage in pouch , tube does not appear to have any drainage. Mid abdomen fistula into this pouch which was starting to lift slightly at edges.. Peri-wound clean/dry/intact  Treatment/Dressing: Drain to left groin is open, apply small Hollister pouch [phone] to this, change pouch weekly and prn dressing failure.      _____________________________________________________________________  Surgical wounds:??????  ??????  Bilateral nephrostomy drains,??   Wounds progress:??no change  Wound Assessment: Found patient with large  amount of fistula drainage leaking around to back but no dressings present to these drains. Patient states they come off easily from leakage.  Bilateral tubes to closed system drainage bag, gold liquid in drainage bags.  Treatment/Dressing: Both to drainage pouches. Dressing changes every 3 days and prn for dressing failure. Clean sites with CHG, allow to dry.Tegaderm dressings have not been staying in place when fistula drainage leaks on dressings. New dressing order: after clean with CHG, apply a slit Mepilex border, securing around tubing to make it occlusive, then apply another Mepilex border to insure it is occlusive and to position tubing so that patient is not lying on tubing.        ____________________________________________________________________  Vascular Wounds on Right Foot????       Wounds progress:????uta  Wound Assessment:?? Patient refusing dressing changes to right foot, will attempt to assess tomorrow. Is also refusing tubigrip to left leg, states  I am going to do that later, not  now.  Floor nurse informed.   Treatment/Dressing: Per Dr. Luan Pulling (pt had appt on 4/25) paint toes daily with betadine, keep dry and keep pressure off toes. Also per appt notes, place 2 layers of tubi-grip to left lower leg from toes to knees - explained this to pt but she refused to allow this nurse to apply.     Below is photo from 08-09-15       _____________________________________________________________________  Surgical wounds:    Colostomy/mucsous fistula ????RLQ  Wound Progress:  No change, stable   Wound Assessment:??Moist red budded stoma, peri-wound clean/dry/intact. Scant amount of mucous drainage from ostomy, and scant amount thin liquid  brown drainage in pouch (unsure if this is from the colostomy or  from the mid abd fistula drainage d/t to both pouches lifting) Has some blanchable erythema of periwound skin  Wound Treatment: Right lower quadrant colostomy currently only functioning as mucous fistula.  Keep covered with small ostomy pouch Hollister # [id number] or Myna Bright [phone]. Change weekly and prn dressing failure.     Changed pouch, using the smaller Eakins #[id number].     ______________________________________________________________    Treatment time: 2nurse 2 hours   Preventative measures.    Off loading and pressure relief as measures are using an accumax mattress, Turning Wedge, pillows, and waffle boots to aid in off load of pressure points.  Other measures include lotion's and cream's to Hydrate and moisturize dry skin.  Moisture barrier cream Crit[employer] clear is provided to protect groin and peri anal area.. Currently has foley catheter with statlock in place.         Labs:   Lab Results   Component Value Date    GLUCOSE 76 08/15/2015    BUN 26* 08/15/2015    CO2 28 08/15/2015    CREATININE 0.40* 08/15/2015    K 4.5 08/15/2015    NA 138 08/15/2015    CL 104 08/15/2015    CALCIUM 7.9* 08/15/2015     Lab Results   Component Value Date    WBC 6.9 08/15/2015    HGB 8.6* 08/15/2015    HCT 26.2* 08/15/2015    MCV 93.0 08/15/2015    PLT 663* 08/15/2015     No results found for: PTT, INR  Lab Results   Component Value Date    PREALBUMIN 16.0* 07/09/2015     Lab Results   Component Value Date    ALBUMIN <1.5* 08/15/2015     Lab Results   Component Value Date    ESR 15 08/04/2015    CRP 96.9* 08/04/2015

## 2015-08-15 NOTE — Unmapped (Signed)
Physical Therapy   Reason Patient Not Seen         Name: Linda [facility]once  DOB: [birth year]  Attending Physician: Valda Lamb, MD  Admission Diagnosis: jewish 07/05/15 dx: sepsis  Date: 08/15/2015    Reviewed Pertinent hospital course: Yes    Unable to see patient due to :Wound Care Attempted to tx pt at 13:15. Pt still completing wound care from earlier this morning and now with c/o pain, stating  this is too much today. Pt left with wound care RN. Plan to cont POC as tol on next visit.     Rulon Abide, PT, DPT [phone]

## 2015-08-15 NOTE — Unmapped (Signed)
Tomeika Christy alert and oriented x4. Pt able to make needs known. Assessment complete. Taking meds as ordered without difficulties. Verbalized c/o pain to ABD (6/10);with relief with medication.Dressings and drains assessed and intact.Tolerating TPN well. Pt in bed with no acute distress noted call light in reach. Will continue to monitor.      45 y.o.  female      Filed Vitals:    08/15/15 0500   BP: 98/56   Pulse: 50   Temp: 98.5 ??F (36.9 ??C)   Resp: 19   SpO2: 98%       Burrell Hodapp Wallace Cullens  08/15/2015

## 2015-08-15 NOTE — Unmapped (Signed)
Hospitalist Progress Note    08/15/2015     L[facility]   LOS: 38 days       HPI;    Patient is s/p OR on 05/31/2015 with [facility] for incision and drainage of complex abdominal wall abscess, rectal EUA, cystourethroscopy, bilateral retrograde pyelograms and removal old stent and placement of left 6x24 firm stent and right ureter embolization, right nephrostomy tube exchange, left nephrostomy tube placement and pelvic drain exchange on 06/06/2015 at a hospital in [place]. She was admitted to [facility] with severe sepsis secondary to peritonitis with stool coming from multiple abdominal wounds. She was seen by Critical Care, general surgery, and ID. General surgery recommended NPO and bowel rest and TPN has been started. She is receiving continued dressing changes and nephrostomy tube care. In addition, she was noted to have gangrene of the right foot and Dr. Donzetta Sprung performed angiogram, thrombectomy, and EIA stent on 06/27/15. Dr. Simeon Craft has evaluated and will follow as outpatient to consider transmetatarsal amputation at a later date. Patient is hemodynamically stable. She was  to [facility] and will need continued wound care, IV Vanc and Zosyn, and TPN.  She was sent back to [facility] on 07/05/2015  with abdominal pain and leukocytosis.CT scan showed multiple abdominal abscesses with large pelvic abscess . Her Hgb was 6.8 in ED and she was transfused 1 unit of blood.     Subjective & Interval History;    Pt examined at the bedside.  Looking depressed and tearful. States that choked on her Oxycodone pill today.   Wound care notes and photos reviewed.  As per RN, the right nephrostomy tube is out 4 cm from disc. Urine was leaking around the stoma .There is urine in the tube as well.      Review of Systems:  10 point ROS was negative except as above.       PMH: Unchanged from H&P.   PSH: Unchanged from H&P.   Family medical history: Unchanged from H&P.   Social history: Unchanged from  H&P      Scheduled Meds:  ??? clopidogrel  75 mg Per NG tube Daily 0900   ??? enoxaparin  40 mg Subcutaneous Daily 0900   ??? famotidine (PF)  20 mg Intravenous BID   ??? fentaNYL  1 patch Transdermal Q72H   ??? gabapentin  125 mg Oral 3 times per day   ??? insulin regular  0-5 Units Subcutaneous Daily 0900   ??? oxyCODONE  10 mg Oral 6x Daily     Continuous Infusions:  ??? TPN ADULT ([facility]) 69.8 mL/hr at 08/14/15 1703   ??? TPN ADULT ([facility])       PRN Meds:ALPRAZolam, alteplase, dextrose, dextrose 50 % in water (D50W), diclofenac sodium, heparin lock flush, HYDROmorphone, ipratropium-albuterol, LORazepam, ondansetron    Objective:    Vital signs in last 24 hours:  Temp:  [97.8 ??F (36.6 ??C)-98.5 ??F (36.9 ??C)] 98.5 ??F (36.9 ??C)  Heart Rate:  [50-112] 50  Resp:  [19-22] 19  BP: (98-99)/(56) 98/56 mmHg    Physical Examination:    GENERAL: The patient is awake,not in acute cardiorespiratory distress. Looking chronically sick.  HEENT:Atraumatic.Normocephalic. PERRLA, EOMI. No icterus, no pallor, no jugular venous distention..No ear or nose discharge.  NECK: Supple. Trachea is in midline. No Thyromegaly  CHEST: Symmetric. Clear to auscultation, bilateral equal air entry. No wheezing or rhonchi.  CARDIOVASCULAR: S1, S2, Regular pulse. No murmur or gallops. No JVD.  ABDOMEN: Whole anterior  abdomen is covered with pouch. Brownish fistula output noted.Bilateral nephrostomy tubes.  EXTREMITIES: There is + BLE edema . Very tender to touch both legs.  Right foot has dressing in place. Could not examine.  Skin;No bruise,No rash.No petechiae.  CNS; CN II-XII grossly intact. No focal neurological deficit.       Intake/Output last 3 shifts:  I/O last 3 completed shifts:  In: 3260 [I.V.:1424; IV Piggyback:20]  Out: 1680 [Urine:850; Drains:830]    Recent Labs;                           Lab name 08/05/15  0351 08/12/15  0521 08/15/15  0650   WBC 6.6 6.9 6.9   HEMOGLOBIN 8.1* 7.9* 8.6*   HEMATOCRIT 24.5* 24.6* 26.2*   MEAN CORPUSCULAR VOLUME 93.6  94.4 93.0   PLATELETS 576* 612* 663*                                                            Lab name 08/08/15  0950 08/12/15  0521 08/15/15  0650   SODIUM 139 140 138   POTASSIUM 4.2 4.1 4.5   CREATININE 0.54* 0.45* 0.40*   BUN 27* 23 26*   CHLORIDE 107 107 104   CO2 26 28 28    PHOSPHORUS 4.1 3.8 4.3   MAGNESIUM 1.8 1.6 1.8                         Lab name 07/26/15  0746 07/29/15  0409 08/05/15  0351 08/12/15  0521   AST 22 22 17 14    ALT 21 22 16 15    BILIRUBIN TOTAL 0.3 0.3 0.2 0.2       Lab name 07/05/15  0702  07/09/15  0544  08/08/15  0950 08/12/15  0521 08/15/15  0650   PREALBUMIN 14.0*  --  16.0*  --   --   --   --    ALBUMIN  --   < > 1.5*  < > <1.5* <1.5*   <1.5* <1.5*   < > = values in this interval not displayed.                                                                                                   Lab name 08/12/15  0521 08/15/15  0650   CALCIUM 7.9* 7.9*   PHOSPHORUS 3.8 4.3                              Lab name 08/05/15  0351 08/12/15  0521   TRIGLYCERIDES 107 115                        Lab name 08/05/15  0900 08/07/15  0850 08/10/15  0830   VANCOMYCIN  TROUGH 9.3* 9.1* 17.4       Diet;    Diet Orders          TPN ADULT ([facility]) at 69.8 mL/hr starting at 05/04 1800    TPN ADULT ([facility]) at 69.8 mL/hr starting at 05/03 1800    Diet NPO effective now Except for: except ice chips, except sips of clears starting at 03/28 3875          Future Appointments:  No future appointments.    Assessment/Plan:      1. Peritoneal abscess- Mixed flora, pigtail drain 06/26/15 . Vanco/Zosyn/Diflucan.           -Completed  6 week Abx on 08/12/15.  F/u CT scan on 5/10 at [facility].    2. Enterocutaneous Fistula- wound care, bowel rest. Still has significant output.    3. Severe intractable acute on chronic pain syndrome- receiving Dilaudid IV every 3 hr  Prn,Fentanyl patch decreased to 50  from 75  mcg. Started PO Oxycodone scheduled as she had better response with it previously. Added Gabapentin as she was on  previously.         Consulted Psychiatry for depression, chronic pain and recommended SNRIs to help with pain and depression.She want to try therapy first.     4. Thrombocytosis. uncertain etiology- follow closely. Likely reactive.    5. Cachexia-   continue TPN, recheck PAB. Albumin stays less than 1.5.    6. Sinus tachycardia. Likely related to pain. Monitor.    7. Ischemic feet/arterial thromboemboli- The patient had a clot in her femoral artery right leg that was cleared by vascular surgery.  However the patient ended up with necrosis at her distal digits which may be a combination of lack of blood flow and the use of pressors. Refusing heparin, aspirin and Plavix. Refused all heparin injections previously as well. Canceled aspirin but continue to encourage Plavix..           Following podiatrist at [facility].    8. History of colon cancer s/p exenteration with bilateral nephrostomy tubes    9. Recurrent presacral abscesses. Completed the course of Abx.    10. Debility- PT/OT    11. Guarded prognosis.     12. On TPN. 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 Cant tolerate PO. Weekly LFT while on TPN. TPN being managed by pharmacy. Electrolytes replacement through TPN. LFT are WNL so far. Mg level was low  Pharmacist increased the dose of Magnesium. Getting better now.    13. Seen by  Surgeon on 08/13/15.  Plan to get CT scan abdomen pelvis with PO contrast at [facility] on 5/10. Pt need to be NPO 4 hr before procedure.    14. D/C planning . Family meeting 5/3.  Time to move to more rehab. Done with Abx.  15. Nausea. Increased frequency of Zofran.  16. Right nephrostomy tube dislodged and leaking. IR referral placed. Will make appointment ASAP. [facility] aware.          Prophylaxis;  GI Prophylaxis: Pepcid  DVT Prophylaxis: Lovenox     Code Status: Full Code      Audi Wettstein MD.       08/15/2015

## 2015-08-15 NOTE — Unmapped (Signed)
Problem: Fall Prevention  Goal: Patient will remain free of falls  Assess and monitor vitals signs, neurological status including level of consciousness and orientation. Reassess fall risk per hospital policy.    Ensure arm band on, uncluttered walking paths in room, adequate room lighting, call light and overbed table within reach, bed in low position, wheels locked, side rails up per policy, and non-skid footwear provided.   Outcome: Progressing  Pts room is uncluttered and adequately lit. Call light and bedside table are within reach. Bed is in lowest position and wheels are locked. Nonskid footwear provided.

## 2015-08-15 NOTE — Unmapped (Signed)
INFECTIOUS DISEASE PROGRESS NOTE    08/15/2015  5:02 PM    Linda Ponce is a 45 y.o. female patient. Hospital Day: 38     Subjective:    Seen for pelvic abscess.  45 yr old female with underlying colorectal cancer with enteroanastomotic and enterocutaneous fistulas.?? Presented to [facility] with abdominal distention and fevers.?? Identified as having pelvic and intraabdominal abscess - drained by IR?? - mixed enteric flora with no predominance.?? Readmitted to [facility] with bleeding for wound.?? Course complicated by ischemia to right fore foot.[name]?? Transferred back to [facility] for 6 weeks course of vanc, pip-tazo and fluconazole.??   Antibiotics stopped as planned on of 5/1  Patient with abdominal pain and complains of sleeping poorly but no other complaints.  Has remained afebrile with normal WBC      Objective:  Vital signs:  Filed Vitals:    08/15/15 1156   BP: 101/59   Pulse: 121   Temp: 98.2 ??F (36.8 ??C)   Resp: 18   SpO2: 100%     Temp last 24 hours:Temp (24hrs), Avg:98.4 ??F (36.9 ??C), Min:98.2 ??F (36.8 ??C), Max:98.5 ??F (36.9 ??C)    Scheduled Meds:  ??? clopidogrel  75 mg Per NG tube Daily 0900   ??? enoxaparin  40 mg Subcutaneous Daily 0900   ??? famotidine (PF)  20 mg Intravenous BID   ??? fentaNYL  1 patch Transdermal Q72H   ??? gabapentin  125 mg Oral 3 times per day   ??? insulin regular  0-5 Units Subcutaneous Daily 0900   ??? oxyCODONE  10 mg Oral 6x Daily       Physical Exam   Nursing note and vitals reviewed.  Constitutional: She is oriented to person, place, and time. She appears tired but in no distress  Cardiovascular: Normal rate, regular rhythm and normal heart sounds.   Right upper chest line, tunneled through which TPN being given   Pulmonary/Chest: Effort normal and breath sounds normal. She has no rales.   Abdominal: There is tenderness.   Enterostomy pouch over fistula  Pelvic drain   Musculoskeletal: She exhibits no edema.   Neurological: She is alert and oriented to person, place, and time. No cranial nerve deficit.        Labs:   Lab Results   Component Value Date    GLUCOSE 76 08/15/2015    BUN 26* 08/15/2015    CO2 28 08/15/2015    CREATININE 0.40* 08/15/2015    K 4.5 08/15/2015    NA 138 08/15/2015    CL 104 08/15/2015    CALCIUM 7.9* 08/15/2015     Lab Results   Component Value Date    MG 1.8 08/15/2015     Lab Results   Component Value Date    PHOS 4.3 08/15/2015     Lab Results   Component Value Date    WBC 6.9 08/15/2015    HGB 8.6* 08/15/2015    HCT 26.2* 08/15/2015    MCV 93.0 08/15/2015    PLT 663* 08/15/2015     No results found for: PTT, INR    Assessment/Plan:  Enterocutaneous fistula  Pelvic abscess post drainage   Completed 6 weeks IV therapy 5/1   Remains with no leukocytosis, no fever   Will sign off, please call if needed    Jannette Spanner  08/15/2015

## 2015-08-15 NOTE — Unmapped (Signed)
Clinical Pharmacy Note - TPN Order    Linda Ponce is a 45 y.o. female that is being followed by the clinical pharmacy department for monitoring and adjustment of TPN.    Current TPN Formula (further increased on 4/21)  ????????????????????????Based on current weight of 40.9 g  ????????????????????????Amino Acids: 47.7 g/L (80 grams, 1.96 grams/kg/day, 320 kcal)  ????????????????????????Dextrose: 175 g/L (293 grams, 997 kcal)  ????????????????????????Lipids: 21 g/L (35.2 grams, 352 kcal)  ????????????????????????Total Daily Calories: 1669 kcal (41 kcal/kg/day)  ????????????????????????Volume: 1675 mL  ????????????????????????Rate: 69.8 mL/h    Additives:  MVI   Trace Elements    1.  Labs not resulted in time; please adjust tomorrows bag of TPN with todays renal panel when resulted  2. Routine labs scheduled every Monday and Thursday  3. Pharmacy will continue to monitor and adjust    Thank you for the consult    Tillie Fantasia PharmD  Clinical Staff Pharmacist  [name] [place]  [phone]

## 2015-08-15 NOTE — Unmapped (Signed)
Pt attempted to take po scheduled pain medications and refused d/t gagging, and c/o they are getting stuck in her throat.  Pt declines to attempt to problem solve w/taking sips of liquid, stating the shooting pains in her foot and abdomen are making it difficult to swallow.  Pt prefers to take her medications by iv route.

## 2015-08-16 ENCOUNTER — Inpatient Hospital Stay: Primary: Internal Medicine

## 2015-08-16 ENCOUNTER — Encounter: Primary: Internal Medicine

## 2015-08-16 ENCOUNTER — Ambulatory Visit: Primary: Internal Medicine

## 2015-08-16 LAB — POC GLU MONITORING DEVICE: POC Glucose Monitoring Device: 106 mg/dL (ref 70–100)

## 2015-08-16 MED ORDER — TPN ADULT (WCH/Drake)
4 | INTRAMUSCULAR | Status: AC
Start: 2015-08-16 — End: 2015-08-17
  Administered 2015-08-16: 21:00:00 via INTRAVENOUS

## 2015-08-16 MED ORDER — oxyCODONE (ROXICODONE) 5 mg/5 mL solution 15 mg
5 | ORAL | Status: AC
Start: 2015-08-16 — End: 2015-08-21
  Administered 2015-08-17 – 2015-08-21 (×26): 15 mg via ORAL

## 2015-08-16 MED ORDER — oxyCODONE (ROXICODONE) 5 mg/5 mL solution 10 mg
5 | ORAL | Status: AC
Start: 2015-08-16 — End: 2015-08-16
  Administered 2015-08-16 – 2015-08-17 (×4): 10 mg via ORAL

## 2015-08-16 MED FILL — HYDROMORPHONE 2 MG/ML INJECTION SYRINGE: 2 2 mg/mL | INTRAMUSCULAR | Qty: 2

## 2015-08-16 MED FILL — GABAPENTIN 250 MG/5 ML ORAL SOLUTION: 50 50 mg/mL | ORAL | Qty: 5

## 2015-08-16 MED FILL — OXYCODONE 5 MG/5 ML ORAL SOLUTION: 5 5 mg/5 mL | ORAL | Qty: 10

## 2015-08-16 MED FILL — FAMOTIDINE (PF) 20 MG/2 ML INTRAVENOUS SOLUTION: 20 20 mg/2 mL | INTRAVENOUS | Qty: 2

## 2015-08-16 MED FILL — TRAVASOL 10 % INTRAVENOUS SOLUTION: 10 10 % | INTRAVENOUS | Qty: 799

## 2015-08-16 MED FILL — OXYCODONE 5 MG TABLET: 5 5 MG | ORAL | Qty: 2

## 2015-08-16 NOTE — Unmapped (Signed)
Patient had refused WCT to assess right foot yesterday, assessment done today    Vascular Wounds on Right Foot???????? ??    Wounds progress:????No significant change  Wound Assessment:?? Dry black stable eschar to dorsal portion and plantar tips of 1st through 5th toes, great toenail has fallen off. Still with superficial open areas of moist pink tissue to plantar distal metatarsal pad area, and small part of plantar surface of great toe.  Scant amount of serosanguinous drainage, patient very anxious about having dressing change done. Scant amount of gauze dressing did stick to open area of plantar great toe but able to remove with soaking with saline, patient c/o of some discomfort with dressing removal, but no c/o of pain after dsg change completed. Does not have tubigrip on left leg, stating  I am going to put that on later, but not now  Treatment/Dressing: Per Dr. Luan Pulling (pt had appt on 4/25) paint toes daily with betadine, keep dry and keep pressure off toes. Also per appt notes, place 2 layers of tubi-grip to left lower leg from toes to knees - explained this to pt but she refused to allow this nurse to apply.             Pouches to abdomen are all intact. Left fistula wound without any drainage, removed foley bag attached to it.  Patient is interested in having some type of pouch where she can unsnap pouch and apply gauze and change as needed to control fistula drainage to mid abdomen instead of using a drainage bag. Left her sample of the Eakin pouch currently using [phone] with an access window/lid attached. Also a one piece high out put Hollister pouch with front access window #80110. Instructed patient to practice with opening and closing both of these windows and can consider trying these.

## 2015-08-16 NOTE — Unmapped (Signed)
Tariah Halder alert and oriented x4. Pt able to make needs known. Assessment complete. Taking meds as ordered without difficulties;refusing oxycodone and neurontin administration. Verbalized c/o pain to ABD (6/10);with relief with medication.Dressings and drains assessed and intact. Tolerating TPN well. Pt in bed with no acute distress noted call light in reach. Will continue to monitor.       45 y.o.  female      Filed Vitals:    08/15/15 2356   BP: 105/50   Pulse: 119   Temp: 98.6 ??F (37 ??C)   Resp: 18   SpO2: 97%       Masiel Gentzler Wallace Cullens  08/16/2015

## 2015-08-16 NOTE — Unmapped (Signed)
Problem: Fall Prevention  Goal: Patient will remain free of falls  Assess and monitor vitals signs, neurological status including level of consciousness and orientation. Reassess fall risk per hospital policy.    Ensure arm band on, uncluttered walking paths in room, adequate room lighting, call light and overbed table within reach, bed in low position, wheels locked, side rails up per policy, and non-skid footwear provided.   Outcome: Progressing  Pts room is uncluttered and adequately lit. Call light and bediside table are within reach. Bed is in lowest position and wheels are locked. Nonskid footwear provided.

## 2015-08-16 NOTE — Unmapped (Signed)
[facility] Inpatient Medical Psychology Service  [place] [facility] of Neurology & Rehabilitation Medicine  Division of Neuropsychology & Medical Psychology     INPATIENT MEDICAL PSYCHOLOGY PROGRESS NOTE    Patient Name:  Linda Ponce          DOB:  1970-07-14  Admit Date:  07/08/2015   CSN:  [id number]    Referring Provider:  Valda Lamb, MD  Location:  [name] Room N319/DN319-02     Current Date:  08/16/2015  Time and Duration of Service:  9:40p-10:40p = 60 minutes face to face time     CPT Code:  21308 - Health & Behavior Intervention - Individual 4 units, 60 minutes face to face time    Current Problem List:      Patient Active Problem List   Diagnosis   ??? Abdominal abscess   ??? Enterocutaneous fistula   ??? Malnutrition   ??? Fistula   ??? Adjustment disorder with mixed anxiety and depressed mood   ??? Pain of multiple sites   ??? Cognitive dysfunction-recent onset.       Current Medications:     Scheduled Medications:    ??? clopidogrel  75 mg Per NG tube Daily 0900   ??? enoxaparin  40 mg Subcutaneous Daily 0900   ??? famotidine (PF)  20 mg Intravenous BID   ??? fentaNYL  1 patch Transdermal Q72H   ??? gabapentin  125 mg Oral 3 times per day   ??? insulin regular  0-5 Units Subcutaneous Daily 0900   ??? [START ON 08/17/2015] oxyCODONE  15 mg Oral 6 times per day     PRN Medications:  ALPRAZolam, alteplase, dextrose, dextrose 50 % in water (D50W), diclofenac sodium, heparin lock flush, HYDROmorphone, ipratropium-albuterol, LORazepam, ondansetron    Objective Presentation/Subjective Concerns and Related Interventions:      ?? Remas was in severe pain and distress when I arrived; fentanyl patch had been reduced to 50 mcg as planned 2 days ago, but oxycodone (now liquid due to choking on a piece of pill yesterday) is still 10 mg q4h and is no longer adequate with the reduction in fentanyl, especially if the oxycodone is late.  Intense negative emotions resulting from feeling that she  is being ignored (oxycodone was due at 9p) prompted her to call house manager who was leaving when I arrived at 9:40p; pt still did not have medication when I left at 10:30p to speak to house MD. Prn Dilaudid also due at 10:30p, but pt cannot have both and late scheduled oxycodone was appropriately prioritized. Pain behaviors warranted an observation-based FLACC rating of 10/10.   ?? Interventions:   ?? Provided support to patient and attempted to engage her in cognitive restructuring of inflammatory self-talk that exacerbated her anger and anxiety and weakened her coping ability, all of which increase perceived pain intensity. She was too distressed to apply it; encouraged deep breathing which she could only maintain until the next jolt of leg pain occurred and could not re-initiate unless prompted.  ?? Spoke to her RN about trying to prioritize getting her scheduled pain med closer to time since she is in the middle of a fentanyl tapering off, is short on the oxycodone needed to compensate, and therefore would be less able to tolerate long delays. Unclear whether this was heard well as focus seemed to be on meds she wouldn't take (neurontin, oxy pills) however pt was compliant with liquid oxycodone since  ordered this morning.   ?? Spoke to [facility] MD about the situation and requested roxycodone increase if her bp can tolerate it. He agreed as pt is largely bedridden. I asked pt to inform nursing if she has any dizziness, etc after getting 15 mg dose.  ?? Discussed need to consider SNRI to reduce emotionality, improve mood and anxiety, and improve ability to cope with pain and delays that naturally occur in hospital settings.    Psychiatric Diagnoses:      ?? Cognitive dysfunction--recent onset.    ?? Likely residual of significant delirium at [facility], pain medications, distraction from pain itself, and possibly IV ABX. Will monotor. Primarily involves orientation to time, and somewhat to  place, short term recall, and working memory. Expected to improve as pt's wounds and infections improve.  ?? MMSE-2 score of 21/20 (T=21) indicates moderate cognitive impairment involving orientation, short term recall, and working memory.   ?? Clinical assessment of common sense reasoning skills indicated normal limits reasoning skills except when her pain gets severe, at which point she becomes fearful and suspicious about medications being withheld because she bother's the nurses too much.    ?? Pain of Multiple Sites.    ?? 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 Pt reports severe pain ranging from 8-10/10 even with fentanyl 125 mcg patch. Dilaudid IV q3h prn reduces it to 5/10 but does not last.  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 No history of narcotic abuse, but she has been on narcotics for abdominal and radiation pain from 2011 cancer surgery and complications x 6 yrs. Best regimen per pt post op at [facility] was oxycodone 15 mg q4h scheduled and dilaudid 1 mg q3h prn which she tapered down before discharge.She also took neurontin 300 mg TID when home prior to surgery for neuropathic pain (low back).  Psychosocial losses (death of father 1 week before surgery, missing teen son who is in [place] w/her mother) reduce ability to cope with pain and hospitalization.     ?? Adjustment disorder with mixed anxiety and depressed mood.  ?? Related to prolonged hospitalization and inadequately controlled pain in abdomen, back, and legs.  ?? She has had NO prior treatment for anxiety or depression or substance use disorders.       RECOMMENDATIONS & PLAN:     DR SHAHID/ATTENDING MD:    1)  As noted above, House MD agreed to increase oxycodone to 15 mg q4h given persistently  more severe pain today (FLACC score 10/10) given reduction in fentanyl 2 days ago. If narcotic load is a concern and you must reduce one of the narcotics, and dilaudid 4 mg may be adding to her nausea, consider reducing it to 3 mg or so if you must. It may be best to get the oxycodone and fentanyl switch completed first.     2)  I continued to encourage pt to allow a trial of SNRI (prefer Remeron given aversion to pills and it comes in Sol-tab form) for depression, anxiety, and improving pain tolerance. She has been well educated about it, and really needs it, but is reluctant. Please remind her of potential benefits as possible. (Psychiatry suggested effexor, but it does not come in a Sol-tab form but Remeron does. I'm not  sure if Effexor comes in a liquid form.)    MEDICAL PSYCHOLOGY PLAN:     ?? I will continue to educate her about the benefits of an antidepressant in her case, and engage her in CBT for depression and coping with pain her complex medical problems.   ?? Encouraged pt to try the relaxation Apps I showed her and to take her neurontin.      Jnae Thomaston P. Minerva Ends, PhD  Licensed Insurance risk surveyor in Medical Psychology  [place] [facility] of Neurology & Rehabilitation Medicine  Office Phone for non-urgent messages/requests:  [phone]  Urgent issues-pager: [phone]    08/16/2015

## 2015-08-16 NOTE — Unmapped (Signed)
Hospitalist Progress Note    08/16/2015     Linda Ponce   LOS: 39 days       HPI;    Patient is s/p OR on 05/31/2015 with [facility] for incision and drainage of complex abdominal wall abscess, rectal EUA, cystourethroscopy, bilateral retrograde pyelograms and removal old stent and placement of left 6x24 firm stent and right ureter embolization, right nephrostomy tube exchange, left nephrostomy tube placement and pelvic drain exchange on 06/06/2015 at a hospital in [place]. She was admitted to [facility] with severe sepsis secondary to peritonitis with stool coming from multiple abdominal wounds. She was seen by Critical Care, general surgery, and ID. General surgery recommended NPO and bowel rest and TPN has been started. She is receiving continued dressing changes and nephrostomy tube care. In addition, she was noted to have gangrene of the right foot and Dr. Donzetta Sprung performed angiogram, thrombectomy, and EIA stent on 06/27/15. Dr. Simeon Craft has evaluated and will follow as outpatient to consider transmetatarsal amputation at a later date. Patient is hemodynamically stable. She was  to [facility] and will need continued wound care, IV Vanc and Zosyn, and TPN.  She was sent back to [facility] on 07/05/2015  with abdominal pain and leukocytosis.CT scan showed multiple abdominal abscesses with large pelvic abscess . Her Hgb was 6.8 in ED and she was transfused 1 unit of blood.     Subjective & Interval History;    Pt had choking on oxycodone pill yesterday and anxious to take it now. Dont follow the nursing instruction.  Asking for liquid Oxycodone.  Looking in pain.  Tearful.  Start crying after looking at the clock that it is time to get medication.    Pt was not cooperative, could not get complete ROS.  Not looking in acute cardiorespiratory distress.  Continues on TPN.      Review of Systems:  10 point ROS was negative except as above.       PMH: Unchanged from H&P.   PSH: Unchanged from H&P.    Family medical history: Unchanged from H&P.   Social history: Unchanged from H&P      Scheduled Meds:  ??? clopidogrel  75 mg Per NG tube Daily 0900   ??? enoxaparin  40 mg Subcutaneous Daily 0900   ??? famotidine (PF)  20 mg Intravenous BID   ??? fentaNYL  1 patch Transdermal Q72H   ??? gabapentin  125 mg Oral 3 times per day   ??? insulin regular  0-5 Units Subcutaneous Daily 0900   ??? oxyCODONE  10 mg Oral 6 times per day     Continuous Infusions:  ??? TPN ADULT ([facility]) 69.8 mL/hr at 08/15/15 1740   ??? TPN ADULT ([facility])       PRN Meds:ALPRAZolam, alteplase, dextrose, dextrose 50 % in water (D50W), diclofenac sodium, heparin lock flush, HYDROmorphone, ipratropium-albuterol, LORazepam, ondansetron    Objective:    Vital signs in last 24 hours:  Temp:  [98.2 ??F (36.8 ??C)-98.6 ??F (37 ??C)] 98.2 ??F (36.8 ??C)  Heart Rate:  [119-123] 122  Resp:  [18-20] 20  BP: (98-105)/(50-61) 98/54 mmHg    Physical Examination:    GENERAL: The patient is awake,not in acute cardiorespiratory distress. Looking chronically sick.  HEENT:Atraumatic.Normocephalic. PERRLA, EOMI. No icterus, no pallor, no jugular venous distention..No ear or nose discharge.  NECK: Supple. Trachea is in midline. No Thyromegaly  CHEST: Symmetric. Clear to auscultation, bilateral equal air entry. No wheezing or rhonchi.  CARDIOVASCULAR: S1, S2, Regular pulse. No murmur or gallops. No JVD.  ABDOMEN: Whole anterior abdomen is covered with pouch. Brownish fistula output noted.Bilateral nephrostomy tubes.  EXTREMITIES: There is + BLE edema . Very tender to touch both legs.  Right foot has dressing in place. Could not examine.  Skin;No bruise,No rash.No petechiae.  CNS; CN II-XII grossly intact. No focal neurological deficit.       Intake/Output last 3 shifts:  I/O last 3 completed shifts:  In: 3155   Out: 1980 [Urine:1450; Drains:530]    Recent Labs;                           Lab name 08/05/15  0351 08/12/15  0521 08/15/15  0650   WBC 6.6 6.9 6.9   HEMOGLOBIN 8.1* 7.9*  8.6*   HEMATOCRIT 24.5* 24.6* 26.2*   MEAN CORPUSCULAR VOLUME 93.6 94.4 93.0   PLATELETS 576* 612* 663*                                                            Lab name 08/08/15  0950 08/12/15  0521 08/15/15  0650   SODIUM 139 140 138   POTASSIUM 4.2 4.1 4.5   CREATININE 0.54* 0.45* 0.40*   BUN 27* 23 26*   CHLORIDE 107 107 104   CO2 26 28 28    PHOSPHORUS 4.1 3.8 4.3   MAGNESIUM 1.8 1.6 1.8                         Lab name 07/26/15  0746 07/29/15  0409 08/05/15  0351 08/12/15  0521   AST 22 22 17 14    ALT 21 22 16 15    BILIRUBIN TOTAL 0.3 0.3 0.2 0.2       Lab name 07/05/15  0702  07/09/15  0544  08/08/15  0950 08/12/15  0521 08/15/15  0650   PREALBUMIN 14.0*  --  16.0*  --   --   --   --    ALBUMIN  --   < > 1.5*  < > <1.5* <1.5*   <1.5* <1.5*   < > = values in this interval not displayed.                                                                                                   Lab name 08/12/15  0521 08/15/15  0650   CALCIUM 7.9* 7.9*   PHOSPHORUS 3.8 4.3                              Lab name 08/05/15  0351 08/12/15  0521   TRIGLYCERIDES 107 115                        Lab  name 08/05/15  0900 08/07/15  0850 08/10/15  0830   VANCOMYCIN TROUGH 9.3* 9.1* 17.4       Diet;    Diet Orders          TPN ADULT ([facility]) at 69.8 mL/hr starting at 05/05 1800    TPN ADULT ([facility]) at 69.8 mL/hr starting at 05/04 1800    Diet NPO effective now Except for: except ice chips, except sips of clears starting at 03/28 0981          Future Appointments:  No future appointments.    Assessment/Plan:      1. Peritoneal abscess- Mixed flora, pigtail drain 06/26/15 . Vanco/Zosyn/Diflucan.           -Completed  6 week Abx on 08/12/15.  F/u CT scan on 5/10 at [facility].    2. Enterocutaneous Fistula- wound care, bowel rest. Still has significant output.    3. Severe intractable acute on chronic pain syndrome- receiving Dilaudid IV every 3 hr  Prn,Fentanyl patch decreased to 50  from 75  mcg. Started PO Oxycodone scheduled as she had  better response with it previously. Added Gabapentin as she was on previously.         Consulted Psychiatry for depression, chronic pain and recommended SNRIs to help with pain and depression.She want to try therapy first.     4. Thrombocytosis. uncertain etiology- follow closely. Likely reactive.    5. Cachexia-   continue TPN, recheck PAB. Albumin stays less than 1.5.    6. Sinus tachycardia. Likely related to pain. Monitor.    7. Ischemic feet/arterial thromboemboli- The patient had a clot in her femoral artery right leg that was cleared by vascular surgery.  However the patient ended up with necrosis at her distal digits which may be a combination of lack of blood flow and the use of pressors. Refusing heparin, aspirin and Plavix. Refused all heparin injections previously as well. Canceled aspirin but continue to encourage Plavix..           Following podiatrist at [facility].    8. History of colon cancer s/p exenteration with bilateral nephrostomy tubes    9. Recurrent presacral abscesses. Completed the course of Abx.    10. Debility- PT/OT    11. Guarded prognosis.     12. On TPN. 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 Cant tolerate PO. Weekly LFT while on TPN. TPN being managed by pharmacy. Electrolytes replacement through TPN. LFT are WNL so far. Mg level was low  Pharmacist increased the dose of Magnesium. Getting better now.    13. Seen by  Surgeon on 08/13/15.  Plan to get CT scan abdomen pelvis with PO contrast at [facility] on 5/10. Pt need to be NPO 4 hr before procedure.    14. D/C planning . Family meeting held on  5/3.  Pt need extensive wound care, TPN, IV Pain meds which are hurdle to discharge.  15. Nausea. Increased frequency of Zofran.  16. Right nephrostomy tube dislodged and leaking. IR referral placed. Will make appointment ASAP. [facility]UC aware.      A/P reviewed and updated.  Oxycodone changed to liquid form because of choking on tab.      Prophylaxis;  GI Prophylaxis: Pepcid  DVT Prophylaxis: Lovenox     Code Status: Full  Code      Anara Cowman MD.       08/16/2015

## 2015-08-16 NOTE — Unmapped (Signed)
Clinical Pharmacy Service - TPN Progress Note    Linda Ponce is a 45 y.o. female followed by the pharmacy department for monitoring and adjustment of TPN    Assessment/Plan:   1) Continue continuous TPN with electrolytes, macronutrients and additives from yesterday  2) Electrolytes listed in chart below:  3) Labs WNL as drawn yesterday but too late for adjustments; no adjustments to todays bag; resume previous bag  4) Corrected Ca OK  5) Pharmacy will continue to monitor electrolytes and make necessary adjustments.    Current TPN Formula (further increased on 4/21)  ????????????????????????Based on current weight of 40.9 g  ????????????????????????Amino Acids: 47.7 g/L (80 grams, 1.96 grams/kg/day, 320 kcal)  ????????????????????????Dextrose: 175 g/L (293 grams, 997 kcal)  ????????????????????????Lipids: 21 g/L (35.2 grams, 352 kcal)  ????????????????????????Total Daily Calories: 1669 kcal (41 kcal/kg/day)  ????????????????????????Volume: 1675 mL  ????????????????????????Rate: 69.8 mL/h    Subjective/Objective Information:    Recent Labs      08/15/15   0650   NA  138   CL  104   CO2  28   BUN  26*   CREATININE  0.40*   K  4.5   PHOS  4.3   MG  1.8   CALCIUM  7.9*   ALBUMIN  <1.5*   GLUCOSE  76     Recent Labs      08/14/15   0636  08/15/15   1540  08/16/15   0610   POCGMD  110*  111*  106*     Corrected Calcium = (0.8*(4mg /dL - Albumin))+Serum Ca     Thank you for the consult,  RACHEL COOK, PHARMD   08/16/2015 8:29 AM

## 2015-08-16 NOTE — Unmapped (Signed)
Problem: Daily Care  Goal: Daily care needs are met  Assess and monitor ability to perform self care and identify potential discharge needs.   Outcome: Not Progressing  Pt prefers to not be moved, stating it hurts too much; prefers specific pillows to be placed in specific places, and declines to have sheets/bedding changed.

## 2015-08-16 NOTE — Unmapped (Signed)
Nursing Clinical Progress Note        Patient Active Problem List    Diagnosis Date Noted   ??? Adjustment disorder with mixed anxiety and depressed mood 08/06/2015   ??? Pain of multiple sites 08/06/2015   ??? Cognitive dysfunction-recent onset. 08/06/2015   ??? 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 Malnutrition 07/08/2015   ??? Fistula 07/08/2015   ??? Abdominal abscess 07/04/2015       Filed Vitals:    08/15/15 1156 08/15/15 1817 08/15/15 2356 08/16/15 0600   BP: 101/59 101/61 105/50 98/54   Pulse: 121 123 119 122   Temp: 98.2 ??F (36.8 ??C) 98.3 ??F (36.8 ??C) 98.6 ??F (37 ??C) 98.2 ??F (36.8 ??C)   TempSrc: Oral Oral Oral Oral   Resp: 18 20 18 20    Height:       Weight:       SpO2: 100% 100% 97% 98%       Lines/Drains/Access - Yes   If yes, please list  r ij catheter; foley catheter, bilateral nephrostomy tubes, left groin drain, rt colostomy, drain bags x 2 to abd fistula/opening.     Does the patient have any changes to their condition warranting intervention? No       RN Shift Note Additional:  pt is in no distress, respirations easy and unlabored w/skin warm, pink and dry.  Pt is able to make needs known. Pt continues to decline medications by mouth, including scheduled oxycontin and gabapentin for pain management.  Pt wants iv medications only.  MD is aware.      Does the patient's have any transfers/appointments or testing scheduled -   No future appointments.    Patient/Family questions - No    Mcarthur Rossetti, RN

## 2015-08-17 LAB — POC GLU MONITORING DEVICE
POC Glucose Monitoring Device: 128 mg/dL (ref 70–100)
POC Glucose Monitoring Device: 101 mg/dL (ref 70–100)

## 2015-08-17 MED ORDER — TPN ADULT (WCH/Drake)
30 | INTRAVENOUS | Status: AC
Start: 2015-08-17 — End: 2015-08-18
  Administered 2015-08-17: 21:00:00 via INTRAVENOUS

## 2015-08-17 MED FILL — FAMOTIDINE (PF) 20 MG/2 ML INTRAVENOUS SOLUTION: 20 20 mg/2 mL | INTRAVENOUS | Qty: 2

## 2015-08-17 MED FILL — GABAPENTIN 250 MG/5 ML ORAL SOLUTION: 50 50 mg/mL | ORAL | Qty: 5

## 2015-08-17 MED FILL — OXYCODONE 5 MG/5 ML ORAL SOLUTION: 5 5 mg/5 mL | ORAL | Qty: 15

## 2015-08-17 MED FILL — FENTANYL 50 MCG/HR TRANSDERMAL PATCH: 50 50 mcg/hr | TRANSDERMAL | Qty: 1

## 2015-08-17 MED FILL — HYDROMORPHONE 2 MG/ML INJECTION SYRINGE: 2 2 mg/mL | INTRAMUSCULAR | Qty: 2

## 2015-08-17 MED FILL — TRAVASOL 10 % INTRAVENOUS SOLUTION: 10 10 % | INTRAVENOUS | Qty: 799

## 2015-08-17 MED FILL — ONDANSETRON HCL (PF) 4 MG/2 ML INJECTION SOLUTION: 4 4 mg/2 mL | INTRAMUSCULAR | Qty: 2

## 2015-08-17 NOTE — Unmapped (Signed)
Assessment completed call light within reach pain management and tpn  continues

## 2015-08-17 NOTE — Unmapped (Signed)
Clinical Pharmacy Note (TPN Dosing)    No labs to review today.      Reviewed chart for any dietary recommendations and other problems.    No changes to TPN    Linda Ponce E Linda Ponce  08/17/2015  8:02 AM

## 2015-08-17 NOTE — Unmapped (Signed)
Linda Ponce alert and oriented x4. Pt able to make needs known.Assessment complete. Taking meds as ordered without difficulties;refused Gabapentin administration. Verbalized c/o pain to ABD (6/10);with relief with medication.Dressings and drains assessed and intact.Tolerating TPN well. Pt in bed with no acute distress noted call light in reach. Will continue to monitor      45 y.o.  female      Filed Vitals:    08/16/15 2351   BP: 105/55   Pulse: 134   Temp: 98.6 ??F (37 ??C)   Resp: 21   SpO2: 92%       Domonik Levario Wallace Cullens  08/17/2015

## 2015-08-17 NOTE — Unmapped (Signed)
Hospitalist Progress Note    08/17/2015     Linda Ponce   LOS: 40 days       HPI;    Patient is s/p OR on 05/31/2015 with [facility] for incision and drainage of complex abdominal wall abscess, rectal EUA, cystourethroscopy, bilateral retrograde pyelograms and removal old stent and placement of left 6x24 firm stent and right ureter embolization, right nephrostomy tube exchange, left nephrostomy tube placement and pelvic drain exchange on 06/06/2015 at a hospital in [place]. She was admitted to [facility] with severe sepsis secondary to peritonitis with stool coming from multiple abdominal wounds. She was seen by Critical Care, general surgery, and ID. General surgery recommended NPO and bowel rest and TPN has been started. She is receiving continued dressing changes and nephrostomy tube care. In addition, she was noted to have gangrene of the right foot and Dr. Donzetta Sprung performed angiogram, thrombectomy, and EIA stent on 06/27/15. Dr. Simeon Craft has evaluated and will follow as outpatient to consider transmetatarsal amputation at a later date. Patient is hemodynamically stable. She was  to [facility] and will need continued wound care, IV Vanc and Zosyn, and TPN.  She was sent back to [facility] on 07/05/2015  with abdominal pain and leukocytosis.CT scan showed multiple abdominal abscesses with large pelvic abscess . Her Hgb was 6.8 in ED and she was transfused 1 unit of blood.     Subjective & Interval History;    Tearful.  Start crying after looking at the clock that it is time to get medication.    Pt was not cooperative, could not get complete ROS.  Not looking in acute cardiorespiratory distress.  Continues on TPN.      Review of Systems:  10 point ROS was negative except as above.       PMH: Unchanged from H&P.   PSH: Unchanged from H&P.   Family medical history: Unchanged from H&P.   Social history: Unchanged from H&P      Scheduled Meds:  ??? clopidogrel  75 mg Per NG tube Daily 0900   ???  enoxaparin  40 mg Subcutaneous Daily 0900   ??? famotidine (PF)  20 mg Intravenous BID   ??? fentaNYL  1 patch Transdermal Q72H   ??? gabapentin  125 mg Oral 3 times per day   ??? insulin regular  0-5 Units Subcutaneous Daily 0900   ??? oxyCODONE  15 mg Oral 6 times per day     Continuous Infusions:  ??? TPN ADULT ([facility]) 69.8 mL/hr at 08/16/15 1702   ??? TPN ADULT ([facility])       PRN Meds:ALPRAZolam, alteplase, dextrose, dextrose 50 % in water (D50W), diclofenac sodium, heparin lock flush, HYDROmorphone, ipratropium-albuterol, LORazepam, ondansetron    Objective:    Vital signs in last 24 hours:  Temp:  [98 ??F (36.7 ??C)-98.6 ??F (37 ??C)] 98.4 ??F (36.9 ??C)  Heart Rate:  [109-134] 109  Resp:  [19-21] 19  BP: (104-105)/(55-61) 105/61 mmHg  FiO2:  [21 %] 21 %    Physical Examination:    GENERAL: The patient is awake,not in acute cardiorespiratory distress. Looking chronically sick.  HEENT:Atraumatic.Normocephalic. PERRLA, EOMI. No icterus, no pallor, no jugular venous distention..No ear or nose discharge.  NECK: Supple. Trachea is in midline. No Thyromegaly  CHEST: Symmetric. Clear to auscultation, bilateral equal air entry. No wheezing or rhonchi.  CARDIOVASCULAR: S1, S2, Regular pulse. No murmur or gallops. No JVD.  ABDOMEN: Whole anterior abdomen is covered with pouch. Brownish  fistula output noted.Bilateral nephrostomy tubes.  EXTREMITIES: There is + BLE edema . Very tender to touch both legs.  Right foot has dressing in place. Could not examine.  Skin;No bruise,No rash.No petechiae.  CNS; CN II-XII grossly intact. No focal neurological deficit.       Intake/Output last 3 shifts:  I/O last 3 completed shifts:  In: 2391   Out: 2675 [Urine:1975; Drains:700]    Recent Labs;                           Lab name 08/05/15  0351 08/12/15  0521 08/15/15  0650   WBC 6.6 6.9 6.9   HEMOGLOBIN 8.1* 7.9* 8.6*   HEMATOCRIT 24.5* 24.6* 26.2*   MEAN CORPUSCULAR VOLUME 93.6 94.4 93.0   PLATELETS 576* 612* 663*                                                             Lab name 08/08/15  0950 08/12/15  0521 08/15/15  0650   SODIUM 139 140 138   POTASSIUM 4.2 4.1 4.5   CREATININE 0.54* 0.45* 0.40*   BUN 27* 23 26*   CHLORIDE 107 107 104   CO2 26 28 28    PHOSPHORUS 4.1 3.8 4.3   MAGNESIUM 1.8 1.6 1.8                         Lab name 07/26/15  0746 07/29/15  0409 08/05/15  0351 08/12/15  0521   AST 22 22 17 14    ALT 21 22 16 15    BILIRUBIN TOTAL 0.3 0.3 0.2 0.2       Lab name 07/05/15  0702  07/09/15  0544  08/08/15  0950 08/12/15  0521 08/15/15  0650   PREALBUMIN 14.0*  --  16.0*  --   --   --   --    ALBUMIN  --   < > 1.5*  < > <1.5* <1.5*   <1.5* <1.5*   < > = values in this interval not displayed.                                                                                                   Lab name 08/12/15  0521 08/15/15  0650   CALCIUM 7.9* 7.9*   PHOSPHORUS 3.8 4.3                              Lab name 08/05/15  0351 08/12/15  0521   TRIGLYCERIDES 107 115                        Lab name 08/05/15  0900 08/07/15  0850 08/10/15  0830   VANCOMYCIN TROUGH 9.3* 9.1* 17.4  Diet;    Diet Orders          TPN ADULT ([facility]) at 69.8 mL/hr starting at 05/06 1800    TPN ADULT ([facility]) at 69.8 mL/hr starting at 05/05 1800    Diet NPO effective now Except for: except ice chips, except sips of clears starting at 03/28 0959          Future Appointments:  No future appointments.    Assessment/Plan:      1. Peritoneal abscess- Mixed flora, pigtail drain 06/26/15 . Vanco/Zosyn/Diflucan.           -Completed  6 week Abx on 08/12/15.  F/u CT scan on 5/10 at [facility].    2. Enterocutaneous Fistula- wound care, bowel rest. Still has significant output.    3. Severe intractable acute on chronic pain syndrome- receiving Dilaudid IV every 3 hr  Prn,Fentanyl patch decreased to 50  from 75  mcg. Started PO Oxycodone scheduled as she had better response with it previously. Added Gabapentin as she was on previously.         Consulted Psychiatry for depression, chronic pain  and recommended SNRIs to help with pain and depression.She want to try therapy first.     4. Thrombocytosis. uncertain etiology- follow closely. Likely reactive.    5. Cachexia-   continue TPN, recheck PAB. Albumin stays less than 1.5.    6. Sinus tachycardia. Likely related to pain. Monitor.    7. Ischemic feet/arterial thromboemboli- The patient had a clot in her femoral artery right leg that was cleared by vascular surgery.  However the patient ended up with necrosis at her distal digits which may be a combination of lack of blood flow and the use of pressors. Refusing heparin, aspirin and Plavix. Refused all heparin injections previously as well. Canceled aspirin but continue to encourage Plavix..           Following podiatrist at [facility].    8. History of colon cancer s/p exenteration with bilateral nephrostomy tubes    9. Recurrent presacral abscesses. Completed the course of Abx.    10. Debility- PT/OT    11. Guarded prognosis.     12. On TPN. 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 Cant tolerate PO. Weekly LFT while on TPN. TPN being managed by pharmacy. Electrolytes replacement through TPN. LFT are WNL so far. Mg level was low  Pharmacist increased the dose of Magnesium. Getting better now.    13. Seen by  Surgeon on 08/13/15.  Plan to get CT scan abdomen pelvis with PO contrast at [facility] on 5/10. Pt need to be NPO 4 hr before procedure.    14. D/C planning . Family meeting held on  5/3.  Pt need extensive wound care, TPN, IV Pain meds which are hurdle to discharge.  15. Nausea. Increased frequency of Zofran.  16. Right nephrostomy tube dislodged and leaking. IR referral placed. Will make appointment ASAP. HUC aware.          Prophylaxis;  GI Prophylaxis: Pepcid  DVT Prophylaxis: Lovenox     Code Status: Full Code      Fredrich Birks MD.       08/17/2015

## 2015-08-17 NOTE — Unmapped (Signed)
Problem: Fall Prevention  Goal: Patient will remain free of falls  Assess and monitor vitals signs, neurological status including level of consciousness and orientation. Reassess fall risk per hospital policy.    Ensure arm band on, uncluttered walking paths in room, adequate room lighting, call light and overbed table within reach, bed in low position, wheels locked, side rails up per policy, and non-skid footwear provided.   Outcome: Progressing  Pts room is uncluttered and adequately lit. Call light and bedside table are within reach. Bed is in lowest position and wheels are locked. Nonskid footwear provided.

## 2015-08-17 NOTE — Unmapped (Signed)
Problem: Knowledge Deficit  Goal: Patient/family/caregiver demonstrates understanding of disease process, treatment plan, medications, and discharge instructions  Complete learning assessment and assess knowledge base.   Educate  To pain maagement

## 2015-08-18 LAB — POC GLU MONITORING DEVICE
POC Glucose Monitoring Device: 108 mg/dL (ref 70–100)
POC Glucose Monitoring Device: 110 mg/dL (ref 70–100)
POC Glucose Monitoring Device: 115 mg/dL (ref 70–100)

## 2015-08-18 MED ORDER — TPN ADULT (WCH/Drake)
10 | INTRAVENOUS | Status: AC
Start: 2015-08-18 — End: 2015-08-19
  Administered 2015-08-18: 21:00:00 via INTRAVENOUS

## 2015-08-18 MED FILL — HYDROMORPHONE 2 MG/ML INJECTION SYRINGE: 2 2 mg/mL | INTRAMUSCULAR | Qty: 2

## 2015-08-18 MED FILL — OXYCODONE 5 MG/5 ML ORAL SOLUTION: 5 5 mg/5 mL | ORAL | Qty: 15

## 2015-08-18 MED FILL — ONDANSETRON HCL (PF) 4 MG/2 ML INJECTION SOLUTION: 4 4 mg/2 mL | INTRAMUSCULAR | Qty: 2

## 2015-08-18 MED FILL — TRAVASOL 10 % INTRAVENOUS SOLUTION: 10 10 % | INTRAVENOUS | Qty: 799

## 2015-08-18 MED FILL — GABAPENTIN 250 MG/5 ML ORAL SOLUTION: 50 50 mg/mL | ORAL | Qty: 5

## 2015-08-18 MED FILL — FAMOTIDINE (PF) 20 MG/2 ML INTRAVENOUS SOLUTION: 20 20 mg/2 mL | INTRAVENOUS | Qty: 2

## 2015-08-18 MED FILL — CLOPIDOGREL 75 MG TABLET: 75 75 mg | ORAL | Qty: 1

## 2015-08-18 NOTE — Unmapped (Signed)
Assessment completed call light within reach tpn pain management continues

## 2015-08-18 NOTE — Unmapped (Signed)
Hospitalist Progress Note    08/18/2015     Linda Ponce   LOS: 41 days       HPI;    45 yr old female with underlying colorectal cancer with enteroanastomotic and enterocutaneous fistulas.?? Presented to [facility] with abdominal distention and fevers.?? Identified as having pelvic and intraabdominal abscess - drained by IR?? - mixed enteric flora with no predominance.?? Readmitted to [facility] with bleeding for wound.?? Course complicated by ischemia to right foot..??     Subjective & Interval History;    Sleepy. Depressed. Irritable. I just woke up, let me put myself together.  Not looking in acute cardiorespiratory distress.  Continues on TPN.      Review of Systems:  10 point ROS was negative except as above.       PMH: Unchanged from H&P.   PSH: Unchanged from H&P.   Family medical history: Unchanged from H&P.   Social history: Unchanged from H&P      Scheduled Meds:  ??? clopidogrel  75 mg Per NG tube Daily 0900   ??? enoxaparin  40 mg Subcutaneous Daily 0900   ??? famotidine (PF)  20 mg Intravenous BID   ??? fentaNYL  1 patch Transdermal Q72H   ??? gabapentin  125 mg Oral 3 times per day   ??? insulin regular  0-5 Units Subcutaneous Daily 0900   ??? oxyCODONE  15 mg Oral 6 times per day     Continuous Infusions:  ??? TPN ADULT ([facility]) 69.8 mL/hr at 08/17/15 1708   ??? TPN ADULT ([facility])       PRN Meds:ALPRAZolam, alteplase, dextrose, dextrose 50 % in water (D50W), diclofenac sodium, heparin lock flush, HYDROmorphone, ipratropium-albuterol, LORazepam, ondansetron    Objective:    Vital signs in last 24 hours:  Temp:  [97.8 ??F (36.6 ??C)-99.8 ??F (37.7 ??C)] 97.8 ??F (36.6 ??C)  Heart Rate:  [123-124] 123  Resp:  [17-18] 17  BP: (101-102)/(56-71) 102/71 mmHg    Physical Examination:    GENERAL: The patient is awake,not in acute cardiorespiratory distress. Looking chronically sick.  HEENT:Atraumatic.Normocephalic. PERRLA, EOMI. No icterus, no pallor, no jugular venous distention..No ear or nose discharge.  NECK: Supple. Trachea is in  midline. No Thyromegaly  CHEST: Symmetric. Clear to auscultation, bilateral equal air entry. No wheezing or rhonchi.  CARDIOVASCULAR: S1, S2, Regular pulse. No murmur or gallops. No JVD.  ABDOMEN: Whole anterior abdomen is covered with pouch. Brownish fistula output noted.Bilateral nephrostomy tubes.  EXTREMITIES: There is + BLE edema . Very tender to touch both legs.  Right foot has dressing in place.   Skin;No bruise,No rash.No petechiae.  CNS; CN II-XII grossly intact. No focal neurological deficit.       Intake/Output last 3 shifts:  I/O last 3 completed shifts:  In: 1690 [IV Piggyback:20]  Out: 3000 [Urine:1750; Drains:1250]    Recent Labs;                           Lab name 08/05/15  0351 08/12/15  0521 08/15/15  0650   WBC 6.6 6.9 6.9   HEMOGLOBIN 8.1* 7.9* 8.6*   HEMATOCRIT 24.5* 24.6* 26.2*   MEAN CORPUSCULAR VOLUME 93.6 94.4 93.0   PLATELETS 576* 612* 663*  Lab name 08/08/15  0950 08/12/15  0521 08/15/15  0650   SODIUM 139 140 138   POTASSIUM 4.2 4.1 4.5   CREATININE 0.54* 0.45* 0.40*   BUN 27* 23 26*   CHLORIDE 107 107 104   CO2 26 28 28    PHOSPHORUS 4.1 3.8 4.3   MAGNESIUM 1.8 1.6 1.8                         Lab name 07/26/15  0746 07/29/15  0409 08/05/15  0351 08/12/15  0521   AST 22 22 17 14    ALT 21 22 16 15    BILIRUBIN TOTAL 0.3 0.3 0.2 0.2       Lab name 07/05/15  0702  07/09/15  0544  08/08/15  0950 08/12/15  0521 08/15/15  0650   PREALBUMIN 14.0*  --  16.0*  --   --   --   --    ALBUMIN  --   < > 1.5*  < > <1.5* <1.5*   <1.5* <1.5*   < > = values in this interval not displayed.                                                                                                   Lab name 08/12/15  0521 08/15/15  0650   CALCIUM 7.9* 7.9*   PHOSPHORUS 3.8 4.3                              Lab name 08/05/15  0351 08/12/15  0521   TRIGLYCERIDES 107 115                        Lab name 08/05/15  0900 08/07/15  0850 08/10/15  0830   VANCOMYCIN TROUGH  9.3* 9.1* 17.4   CT abdomen 3/24  1. Multiple abscess, large pelvic abscess with surgical drain,  right issue rectal fossa.  2. 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  3. Granulation tissue enhancement or blood extravasation in the  lower abdominal wall as described  4. Proximity of the small bowel vasculature at the level of the  open wound on the right, side of active bleeding  5. Lower lobe bronchopneumonia and aspiration is also considered    Diet;    Diet Orders          TPN ADULT ([facility]) at 69.8 mL/hr starting at 05/07 1800    TPN ADULT ([facility]) at 69.8 mL/hr starting at 05/06 1800    Diet NPO effective now Except for: except ice chips, except sips of clears starting at 03/28 0959          Future Appointments:  No future appointments.    Assessment/Plan:      1. Peritoneal abscess- Mixed flora, pigtail drain 06/26/15 . Vanco/Zosyn/Diflucan.           -Completed  6 week Abx on 08/12/15.  F/u CT scan on 5/10 at [facility].  2. Enterocutaneous Fistula- wound care, bowel rest. Still has significant output.    3. Severe intractable acute on chronic pain syndrome- receiving Dilaudid IV every 3 hr  Prn,Fentanyl patch decreased to 50  from 75  mcg. Started PO Oxycodone scheduled as she had better response with it previously. Added Gabapentin as she was on previously.         Consulted Psychiatry for depression, chronic pain and recommended SNRIs to help with pain and depression.She want to try therapy first.     4. Thrombocytosis. uncertain etiology- follow closely. Likely reactive.    5. Cachexia-   continue TPN, recheck PAB. Albumin stays less than 1.5.    6. Sinus tachycardia. Likely related to pain. Monitor.    7. Ischemic feet/arterial thromboemboli- The patient had a clot in her femoral artery right leg that was cleared by vascular surgery.  However the patient ended up with necrosis at her distal digits which may be a combination of lack of blood flow  and the use of pressors. Refusing heparin, aspirin and Plavix. Refused all heparin injections previously as well. Canceled aspirin but continue to encourage Plavix..           Following podiatrist at [facility].    8. History of colon cancer s/p exenteration with bilateral nephrostomy tubes    9. Recurrent presacral abscesses. Completed the course of Abx.    10. Debility- PT/OT    11. Guarded prognosis.     12. On TPN. 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 Cant tolerate PO. Weekly LFT while on TPN. TPN being managed by pharmacy. Electrolytes replacement through TPN. LFT are WNL so far. Mg level was low  Pharmacist increased the dose of Magnesium. Getting better now.    13. Seen by  Surgeon on 08/13/15.  Plan to get CT scan abdomen pelvis with PO contrast at [facility] on 5/10. Pt need to be NPO 4 hr before procedure.    14. D/C planning . Family meeting held on  5/3.  Pt need extensive wound care, TPN, IV Pain meds which are hurdle to discharge.  15. Nausea. Increased frequency of Zofran.  16. Right nephrostomy tube dislodged and leaking. IR referral placed. Will make appointment ASAP. HUC aware.          Prophylaxis;  GI Prophylaxis: Pepcid  DVT Prophylaxis: Lovenox     Code Status: Full Code      Fredrich Birks MD.       08/18/2015

## 2015-08-18 NOTE — Unmapped (Signed)
Problem: Fall Prevention  Goal: Patient will remain free of falls  Assess and monitor vitals signs, neurological status including level of consciousness and orientation. Reassess fall risk per hospital policy.    Ensure arm band on, uncluttered walking paths in room, adequate room lighting, call light and overbed table within reach, bed in low position, wheels locked, side rails up per policy, and non-skid footwear provided.   Outcome: Progressing  Pts room is uncluttered and adequately lit. Call light and bedside table are within reach. Bed is in lowest position and wheels are locked. Nonskid footwear provided.

## 2015-08-18 NOTE — Unmapped (Signed)
Assessment completed call light within reach continue tpn

## 2015-08-18 NOTE — Unmapped (Signed)
Linda Ponce alert and oriented x4. Pt able to make needs known. Assessment complete. Taking meds as ordered without difficulties. Verbalized c/o pain to ABD (6/10);with relief with medication.Dressings and drains assessed and intact. Tolerating TPN well. Pt in bed with no acute distress noted call light in reach. Will continue to monitor.      45 y.o.  female      Filed Vitals:    08/18/15 0016   BP: 101/56   Pulse: 124   Temp: 99.8 ??F (37.7 ??C)   Resp: 18   SpO2: 92%       Clarence Cogswell Wallace Cullens  08/18/2015

## 2015-08-18 NOTE — Unmapped (Signed)
Problem: Knowledge Deficit  Goal: Patient/family/caregiver demonstrates understanding of disease process, treatment plan, medications, and discharge instructions  Complete learning assessment and assess knowledge base.   Discuss discharge plans

## 2015-08-18 NOTE — Unmapped (Signed)
Clinical Pharmacy Note (TPN Dosing)    No labs to review today.      Reviewed chart for any dietary recommendations and other problems.    No changes to TPN    Linda Ponce E [name]  08/18/2015  7:38 AM

## 2015-08-19 LAB — HEPATIC FUNCTION PANEL
ALT: 26 U/L (ref 7–52)
AST: 23 U/L (ref 13–39)
Albumin: <1.5 g/dL (ref 3.5–5.7)
Alkaline Phosphatase: 202 U/L (ref 36–125)
Bilirubin, Direct: 0.11 mg/dL (ref 0.00–0.40)
Bilirubin, Indirect: 0.19 mg/dL (ref 0.00–1.10)
Total Bilirubin: 0.3 mg/dL (ref 0.0–1.5)
Total Protein: 5.3 g/dL (ref 6.4–8.9)

## 2015-08-19 LAB — RENAL FUNCTION PANEL W/EGFR
Albumin: <1.5 g/dL (ref 3.5–5.7)
Anion Gap: 5 mmol/L (ref 3–16)
BUN: 30 mg/dL (ref 7–25)
CO2: 30 mmol/L (ref 21–33)
Calcium: 8.2 mg/dL (ref 8.6–10.3)
Chloride: 100 mmol/L (ref 98–110)
Creatinine: 0.54 mg/dL (ref 0.60–1.30)
Glucose: 75 mg/dL (ref 70–100)
Osmolality, Calculated: 285 mOsm/kg (ref 278–305)
Phosphorus: 4.3 mg/dL (ref 2.1–4.7)
Potassium: 4.5 mmol/L (ref 3.5–5.3)
Sodium: 135 mmol/L (ref 133–146)
eGFR AA CKD-EPI: >90 See note.
eGFR NONAA CKD-EPI: >90 See note.

## 2015-08-19 LAB — CBC
Hematocrit: 27.4 % (ref 35.0–45.0)
Hemoglobin: 8.7 g/dL (ref 11.7–15.5)
MCH: 29.5 pg (ref 27.0–33.0)
MCHC: 31.7 g/dL (ref 32.0–36.0)
MCV: 92.9 fL (ref 80.0–100.0)
MPV: 6.6 fL (ref 7.5–11.5)
Platelets: 638 10*3/uL (ref 140–400)
RBC: 2.95 10*6/uL (ref 3.80–5.10)
RDW: 17.5 % (ref 11.0–15.0)
WBC: 8.8 10*3/uL (ref 3.8–10.8)

## 2015-08-19 LAB — POC GLU MONITORING DEVICE
POC Glucose Monitoring Device: 106 mg/dL (ref 70–100)
POC Glucose Monitoring Device: 110 mg/dL (ref 70–100)

## 2015-08-19 LAB — TRIGLYCERIDES: Triglycerides: 115 mg/dL (ref 10–149)

## 2015-08-19 LAB — MAGNESIUM: Magnesium: 2 mg/dL (ref 1.5–2.5)

## 2015-08-19 MED ORDER — sodium chloride 0.9 % infusion
INTRAVENOUS | Status: AC
Start: 2015-08-19 — End: 2015-08-22
  Administered 2015-08-20 – 2015-08-21 (×2): 50 mL/h via INTRAVENOUS

## 2015-08-19 MED ORDER — TPN ADULT (WCH/Drake)
10 | INTRAVENOUS | Status: AC
Start: 2015-08-19 — End: 2015-08-20
  Administered 2015-08-19: 21:00:00 via INTRAVENOUS

## 2015-08-19 MED FILL — OXYCODONE 5 MG/5 ML ORAL SOLUTION: 5 5 mg/5 mL | ORAL | Qty: 15

## 2015-08-19 MED FILL — HYDROMORPHONE 2 MG/ML INJECTION SYRINGE: 2 2 mg/mL | INTRAMUSCULAR | Qty: 2

## 2015-08-19 MED FILL — GABAPENTIN 250 MG/5 ML ORAL SOLUTION: 50 50 mg/mL | ORAL | Qty: 5

## 2015-08-19 MED FILL — ONDANSETRON HCL (PF) 4 MG/2 ML INJECTION SOLUTION: 4 4 mg/2 mL | INTRAMUSCULAR | Qty: 2

## 2015-08-19 MED FILL — TRAVASOL 10 % INTRAVENOUS SOLUTION: 10 10 % | INTRAVENOUS | Qty: 799

## 2015-08-19 MED FILL — FAMOTIDINE (PF) 20 MG/2 ML INTRAVENOUS SOLUTION: 20 20 mg/2 mL | INTRAVENOUS | Qty: 2

## 2015-08-19 NOTE — Unmapped (Signed)
Clinical Pharmacy Note - TPN Order    Toyoko Silos is a 46 y.o. female that is being followed by the clinical pharmacy department for monitoring and adjustment of TPN.    Patient remains NPO with ice chips, sips of clear liquids    Plan:    -adjusted Calcium 10.2 (arbitrary as Albumin < 1.5)-no change to calcium in TPN  -Magnesium 2.0-no change to TPN necessary  -Phosphorus WNL  -Triglycerides 115-WNL and stable  -Alkaline phosphatase has trended up to 202  -BS WNL-not requiring s/s insulin coverage; now tested once daily  -formulation lsted below    TPN Electrolytes   Sodium Chloride: 80 mEq   Sodium Acetate: 0 mEq   Sodium Phosphate: 0 mmol   Magnesium Sulfate: 18 mEq   Calcium Gluconate: 5 mEq   Potassium Chloride: 20 mEq   Potassium Acetate: 0 mEq   Potassium Phosphate: 15 mmol TPN Macronutrients   Volume: 1675 mL   Rate: 69.8 mL/hr   Amino Acids: 79.9 grams/day (47.7 g/L)    Dextrose: 293.3 grams/day (175 g/L)    Lipids: 35.19 grams/day  (21 g/L)  TPN Additives   MVI 10 mL   TE 1 mL  Ascorbic acid 200 mg             SODIUM   Date Value Ref Range Status   08/19/2015 135 133 - 146 mmol/L Final     CHLORIDE   Date Value Ref Range Status   08/19/2015 100 98 - 110 mmol/L Final     CO2   Date Value Ref Range Status   08/19/2015 30 21 - 33 mmol/L Final     POTASSIUM   Date Value Ref Range Status   08/19/2015 4.5 3.5 - 5.3 mmol/L Final     PHOSPHORUS   Date Value Ref Range Status   08/19/2015 4.3 2.1 - 4.7 mg/dL Final     CALCIUM   Date Value Ref Range Status   08/19/2015 8.2* 8.6 - 10.3 mg/dL Final     GLUCOSE   Date Value Ref Range Status   08/19/2015 75 70 - 100 mg/dL Final       Ruthann Angulo E Fermina Mishkin [facility] 08/19/2015 9:56 AM

## 2015-08-19 NOTE — Unmapped (Signed)
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 Presented to [facility] with abdominal distention and fevers.?? Identified as having pelvic and intraabdominal abscess - drained by IR?? - mixed enteric flora with no predominance.?? Readmitted to [facility] with bleeding for wound.?? Course complicated by ischemia to right foot..??     Subjective & Interval History;    Looking withdrawn.  No new complains.  Pain in not under under good control because she is not getting the pain medication on time.    Left abdominal fistula has no out put today.   Nephrostomy tube not leaking today.    No  fever, chills. No SOB.        Review of Systems:  10 point ROS was negative except as above.       PMH: Unchanged from H&P.   PSH: Unchanged from H&P.   Family medical history: Unchanged from H&P.   Social history: Unchanged from H&P      Scheduled Meds:  ??? clopidogrel  75 mg Per NG tube Daily 0900   ??? enoxaparin  40 mg Subcutaneous Daily 0900   ??? famotidine (PF)  20 mg Intravenous BID   ??? fentaNYL  1 patch Transdermal Q72H   ??? gabapentin  125 mg Oral 3 times per day   ??? insulin regular  0-5 Units Subcutaneous Daily 0900   ??? oxyCODONE  15 mg Oral 6 times per day     Continuous Infusions:  ??? TPN ADULT ([facility]) 69.8 mL/hr at 08/18/15 1704   ??? TPN ADULT ([facility])       PRN Meds:ALPRAZolam, alteplase, dextrose, dextrose 50 % in water (D50W), diclofenac sodium, heparin lock flush, HYDROmorphone, ipratropium-albuterol, LORazepam, ondansetron    Objective:    Vital signs in last 24 hours:  Temp:  [97.9 ??F (36.6 ??C)-98.3 ??F (36.8 ??C)] 97.9 ??F (36.6 ??C)  Heart Rate:  [120-122] 122  Resp:  [18-20] 18  BP: (104-105)/(58-65) 105/65 mmHg    Physical Examination:    GENERAL: The patient is awake,not in acute cardiorespiratory distress. Looking chronically sick.  HEENT:Atraumatic.Normocephalic. PERRLA, EOMI. No  icterus, no pallor, no jugular venous distention..No ear or nose discharge.  NECK: Supple. Trachea is in midline. No Thyromegaly  CHEST: Symmetric. Clear to auscultation, bilateral equal air entry. No wheezing or rhonchi.  CARDIOVASCULAR: S1, S2, Regular pulse. No murmur or gallops. No JVD.  ABDOMEN: Whole anterior abdomen is covered with pouch. Brownish fistula output noted.Bilateral nephrostomy tubes.  EXTREMITIES: There is + BLE edema . Very tender to touch both legs.  Right foot has dressing in place.   Skin;No bruise,No rash.No petechiae.  CNS; CN II-XII grossly intact. No focal neurological deficit.       Intake/Output last 3 shifts:  I/O last 3 completed shifts:  In: 3229 [P.O.:120; I.V.:900; Other:500; IV Piggyback:20]  Out: 3100 [Urine:2200; Drains:900]    Recent Labs;                           Lab name 08/12/15  0521 08/15/15  0650 08/19/15  0520   WBC 6.9 6.9 8.8   HEMOGLOBIN 7.9* 8.6* 8.7*   HEMATOCRIT 24.6* 26.2* 27.4*   MEAN CORPUSCULAR VOLUME 94.4 93.0 92.9   PLATELETS 612* 663* 638*  Lab name 08/12/15  0521 08/15/15  0650 08/19/15  0520   SODIUM 140 138 135   POTASSIUM 4.1 4.5 4.5   CREATININE 0.45* 0.40* 0.54*   BUN 23 26* 30*   CHLORIDE 107 104 100   CO2 28 28 30    PHOSPHORUS 3.8 4.3 4.3   MAGNESIUM 1.6 1.8 2.0                         Lab name 07/29/15  0409 08/05/15  0351 08/12/15  0521 08/19/15  0520   AST 22 17 14 23    ALT 22 16 15 26    BILIRUBIN TOTAL 0.3 0.2 0.2 0.3       Lab name 07/05/15  0702  07/09/15  0544  08/12/15  0521 08/15/15  0650 08/19/15  0520   PREALBUMIN 14.0*  --  16.0*  --   --   --   --    ALBUMIN  --   < > 1.5*  < > <1.5*   <1.5* <1.5* <1.5*   <1.5*   < > = values in this interval not displayed.                                                                                                   Lab name 08/15/15  0650 08/19/15  0520   CALCIUM 7.9* 8.2*   PHOSPHORUS 4.3 4.3                              Lab name  08/12/15  0521 08/19/15  0520   TRIGLYCERIDES 115 115                        Lab name 08/05/15  0900 08/07/15  0850 08/10/15  0830   VANCOMYCIN TROUGH 9.3* 9.1* 17.4   CT abdomen 3/24  1. Multiple abscess, large pelvic abscess with surgical drain,  right issue rectal fossa.  2. 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  3. Granulation tissue enhancement or blood extravasation in the  lower abdominal wall as described  4. Proximity of the small bowel vasculature at the level of the  open wound on the right, side of active bleeding  5. Lower lobe bronchopneumonia and aspiration is also considered    Diet;    Diet Orders          TPN ADULT ([facility]) at 69.8 mL/hr starting at 05/08 1800    TPN ADULT ([facility]) at 69.8 mL/hr starting at 05/07 1800    Diet NPO effective now Except for: except ice chips, except sips of clears starting at 03/28 8119          Future Appointments:  No future appointments.    Assessment/Plan:      1. Peritoneal abscess- Mixed flora, pigtail drain 06/26/15 . Vanco/Zosyn/Diflucan.           -Completed  6 week Abx on 08/12/15.  F/u CT scan on 5/10  at [facility].    2. Enterocutaneous Fistula- wound care, bowel rest. Still has significant output.    3. Severe intractable acute on chronic pain syndrome- receiving Dilaudid IV every 3 hr  Prn,Fentanyl patch decreased to 50  from 75  mcg. Started PO Oxycodone scheduled as she had better response with it previously. Added Gabapentin as she was on previously.         Consulted Psychiatry for depression, chronic pain and recommended SNRIs to help with pain and depression.She want to try therapy first.     4. Thrombocytosis. uncertain etiology- follow closely. Likely reactive.    5. Cachexia-   continue TPN, recheck PAB. Albumin stays less than 1.5.    6. Sinus tachycardia. Likely related to pain. Monitor.    7. Ischemic feet/arterial thromboemboli- The patient had a clot in her femoral artery right leg  that was cleared by vascular surgery.  However the patient ended up with necrosis at her distal digits which may be a combination of lack of blood flow and the use of pressors. Refusing heparin, aspirin and Plavix. Refused all heparin injections previously as well. Canceled aspirin but continue to encourage Plavix..           Following podiatrist at [facility].    8. History of colon cancer s/p exenteration with bilateral nephrostomy tubes    9. Recurrent presacral abscesses. Completed the course of Abx.    10. Debility- PT/OT    11. Guarded prognosis.     12. On TPN. 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 Cant tolerate PO. Weekly LFT while on TPN. TPN being managed by pharmacy. Electrolytes replacement through TPN. LFT are WNL so far. Mg level was low  Pharmacist increased the dose of Magnesium. Getting better now.    13. Seen by  Surgeon on 08/13/15.  Plan to get CT scan abdomen pelvis with PO contrast at [facility] on 5/10. Pt need to be NPO 4 hr before procedure.    14. D/C planning . Family meeting held on  5/3.  Pt need extensive wound care, TPN, IV Pain meds which are hurdle to discharge.  15. Nausea. Increased frequency of Zofran.  16. Right nephrostomy tube dislodged and leaking. IR referral placed.     BUN noted elevated. Monitor. Gentle IVF.      Prophylaxis;  GI Prophylaxis: Pepcid  DVT Prophylaxis: Lovenox     Code Status: Full Code      Ulysses Alper MD.       08/19/2015

## 2015-08-19 NOTE — Unmapped (Signed)
Problem: Fall Prevention  Goal: Patient will remain free of falls  Assess and monitor vitals signs, neurological status including level of consciousness and orientation. Reassess fall risk per hospital policy.    Ensure arm band on, uncluttered walking paths in room, adequate room lighting, call light and overbed table within reach, bed in low position, wheels locked, side rails up per policy, and non-skid footwear provided.   Outcome: Progressing  Patient in bed, 2/4 side rails up and in place. Non-slip socks on feet at all times while out of bed. Door to room open as tolerated, fall risk sign in door way. Toileting assistance offered every two hours and as needed. Bed in low position. Call light within reach.

## 2015-08-19 NOTE — Unmapped (Signed)
 Nursing Clinical Progress Note        Patient Active Problem List    Diagnosis Date Noted   ??? Adjustment disorder with mixed anxiety and depressed mood 08/06/2015   ??? Pain of multiple sites 08/06/2015   ??? Cognitive dysfunction-recent onset. 08/06/2015   ??? 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 Malnutrition 07/08/2015   ??? Fistula 07/08/2015   ??? Abdominal abscess 07/04/2015       Filed Vitals:    08/18/15 1623 08/19/15 0030 08/19/15 0659 08/19/15 1100   BP: 104/58 105/65  109/66   Pulse: 120 122  115   Temp: 98.3 ??F (36.8 ??C) 97.9 ??F (36.6 ??C)  98 ??F (36.7 ??C)   TempSrc: Oral Oral Axillary    Resp: 20 18  20    Height:       Weight:       SpO2: 97% 100%  100%       Lines/Drains/Access - Yes   If yes, please list  R triple lumen subclavian    Does the patient have any changes to their condition warranting intervention? No       RN Shift Note Additional: Patient alert and oriented x 4. Complaints of pain relieved with routine and PRN medications. See flow sheets per assessment. Patient refused PO medications and lovenox. MD aware. TPN continues per orders, patient tolerates well. Pouches containing fistula's in place and intact. Nephrostomy tubes in place, dressings to site intact. Patient refused wound care to coccyx and BLE. Education provided pertaining to wound care, patient continued to refuse. Dressings in place and intact. Bed in low position. Non-slip socks provided. Call light within reach.     Does the patient's have any transfers/appointments or testing scheduled -   No future appointments.    Patient/Family questions - No     MARIE , RN

## 2015-08-19 NOTE — Unmapped (Signed)
Occupational Therapy  Occupational Therapy Progress Note    Name: Linda Ponce  DOB: [birth year]  Attending Physician: Valda Lamb, MD  Admission Diagnosis: jewish 07/05/15 dx: sepsis  Date: 08/19/2015    Pt upon arrival immediately refusing therapy/out of bed. Pt with complaints regarding a situation last night and her pain medication regimen becoming messed up. Educated pt on lack of progress made thus far and how pt is limiting self by not participating; pt became angry and crying about her pain and how she can't do it sometimes. Redirected pt and continued to encourage pt to participate and outcomes that will happen if pt continues to be mobile. Pt again refused. Explained to pt that if she does refuse again this week; pt will have to be discharge from OT services. Pt verbalized understanding.     Cont per POC in colboration with OTR/L  [name], COTA/L

## 2015-08-19 NOTE — Unmapped (Signed)
Physical Therapy   Reason Patient Not Seen         Name: Latori Beggs  DOB: [birth year]  Attending Physician: Valda Lamb, MD  Admission Diagnosis: jewish 07/05/15 dx: sepsis  Date: 08/20/2015    Reviewed Pertinent hospital course: Yes    Unable to see patient due to :Pain Attempted to tx pt at 13:12 with OT. Pt declined therapy crying and upset because of pain in B LEs. Pain meds not due at this time. Pt encouraged to get OOB and move to prevent further contracture in B LEs and potentially decrease pain. Plan to cont with POC on next visit.     Rulon Abide, PT, DPT [phone]

## 2015-08-20 ENCOUNTER — Encounter: Attending: Surgery | Primary: Internal Medicine

## 2015-08-20 LAB — POC GLU MONITORING DEVICE
POC Glucose Monitoring Device: 108 mg/dL (ref 70–100)
POC Glucose Monitoring Device: 106 mg/dL — ABNORMAL HIGH (ref 70–100)
POC Glucose Monitoring Device: 107 mg/dL — ABNORMAL HIGH (ref 70–100)
POC Glucose Monitoring Device: 104 mg/dL — ABNORMAL HIGH (ref 70–100)

## 2015-08-20 MED ORDER — TPN ADULT (WCH/Drake)
2 | INTRAVENOUS | Status: AC
Start: 2015-08-20 — End: 2015-08-21
  Administered 2015-08-20: 22:00:00 via INTRAVENOUS

## 2015-08-20 MED FILL — OXYCODONE 5 MG/5 ML ORAL SOLUTION: 5 5 mg/5 mL | ORAL | Qty: 15

## 2015-08-20 MED FILL — ONDANSETRON HCL (PF) 4 MG/2 ML INJECTION SOLUTION: 4 4 mg/2 mL | INTRAMUSCULAR | Qty: 2

## 2015-08-20 MED FILL — HYDROMORPHONE 2 MG/ML INJECTION SYRINGE: 2 2 mg/mL | INTRAMUSCULAR | Qty: 2

## 2015-08-20 MED FILL — FAMOTIDINE (PF) 20 MG/2 ML INTRAVENOUS SOLUTION: 20 20 mg/2 mL | INTRAVENOUS | Qty: 2

## 2015-08-20 MED FILL — TRAVASOL 10 % INTRAVENOUS SOLUTION: 10 10 % | INTRAVENOUS | Qty: 799

## 2015-08-20 MED FILL — GABAPENTIN 250 MG/5 ML ORAL SOLUTION: 50 50 mg/mL | ORAL | Qty: 5

## 2015-08-20 MED FILL — FENTANYL 50 MCG/HR TRANSDERMAL PATCH: 50 50 mcg/hr | TRANSDERMAL | Qty: 1

## 2015-08-20 MED FILL — ENOXAPARIN 40 MG/0.4 ML SUBCUTANEOUS SYRINGE: 40 40 mg/0.4 mL | SUBCUTANEOUS | Qty: 0.4

## 2015-08-20 MED FILL — CLOPIDOGREL 75 MG TABLET: 75 75 mg | ORAL | Qty: 1

## 2015-08-20 NOTE — Unmapped (Signed)
Patient alert and oriented x4 upon assessment this evening.  Speech is clear, and patient appears calm.  No c/o cough or sore throat, and lung sounds are clear. Vitals are stable.  Patient has c/o pain and discomfort with prn pain medication administration which was effective, and no s/s of distress are evident at this time.  Bed mobility and ADL care are performed with staff assistance and without complication. Patient takes medications well.  Over bed table and call light is within reach.  Side rails are up x2.  TPN infusing without any complications, will continue to monitor.

## 2015-08-20 NOTE — Unmapped (Signed)
Problem: Compromised Skin Integrity  Goal: LTG - Patient will be free from infection  Outcome: Progressing  Monitor vital signs and lab values for signs of infection.

## 2015-08-20 NOTE — Unmapped (Signed)
[facility] Inpatient Medical Psychology Service  [place] Department of Neurology & Rehabilitation Medicine  Division of Neuropsychology & Medical Psychology     INPATIENT MEDICAL PSYCHOLOGY PROGRESS NOTE    Patient Name:  Linda Ponce          DOB:  1970/11/13  Admit Date:  07/08/2015   CSN:  [id number]    Referring Provider:  Valda Lamb, MD  Location:  [name] Room N319/DN319-02     Current Date:  08/20/2015  Time and Duration of Service:  2:45p-3:45p = 60 minutes face to face time     CPT Code:  57846 - Health & Behavior Intervention - Individual 4 units, 60 minutes face to face time    Current Problem List:      Patient Active Problem List   Diagnosis   ??? Abdominal abscess   ??? Enterocutaneous fistula   ??? Malnutrition   ??? Fistula   ??? Adjustment disorder with mixed anxiety and depressed mood   ??? Pain of multiple sites   ??? Cognitive dysfunction-recent onset.       Current Medications:     Scheduled Medications:    ??? clopidogrel  75 mg Per NG tube Daily 0900   ??? enoxaparin  40 mg Subcutaneous Daily 0900   ??? famotidine (PF)  20 mg Intravenous BID   ??? fentaNYL  1 patch Transdermal Q72H   ??? gabapentin  125 mg Oral 3 times per day   ??? insulin regular  0-5 Units Subcutaneous Daily 0900   ??? oxyCODONE  15 mg Oral 6 times per day     PRN Medications:  ALPRAZolam, alteplase, dextrose, dextrose 50 % in water (D50W), diclofenac sodium, heparin lock flush, HYDROmorphone, ipratropium-albuterol, LORazepam, ondansetron    Objective Presentation/Subjective Concerns and Related Interventions:      ?? Pt presented as calm and attentive and desiring to talk. She has decided she does not want to take an antidepressant despite the advantages he can present for improved pain control. She stays focused on per perception of not being depressed which she seems unable to accept.   ?? Informed her that she will then have to increase her effort in the cognitive-behavioral therapy  activities I have begun with her (she has yet to open the phone Apps I gave her so she can learn relaxation exercises to help reduce pain.  ?? She also continues to have much maladaptive thinking that makes it difficult for her to cope with the irregularities in hospital activities--thinking everything MUST happen the way it is scheduled or how she thinks it SHOULD be. This absolutistic thinking increases anger and resentment when meds aren't on time, making it much more difficult for her to cope with it and causing muscle tension which worsens pain.  ?? She has taken her neurontin for the past 3 doses; she is not sure if it is helping yet.    Psychiatric Diagnoses:      ?? Cognitive dysfunction--recent onset.    ?? Likely residual of significant delirium at [facility], pain medications, distraction from pain itself, and possibly IV ABX. Will monotor. Primarily involves orientation to time, and somewhat to place, short term recall, and working memory. Expected to improve as pt's wounds and infections improve.  ?? MMSE-2 score of 21/20 (T=21) indicates moderate cognitive impairment involving orientation, short term recall, and working memory.   ?? Clinical assessment of common sense reasoning skills indicated  normal limits reasoning skills except when her pain gets severe, at which point she becomes fearful and suspicious about medications being withheld because she bother's the nurses too much.    ?? Pain of Multiple Sites.    ?? 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 Pt reports severe pain ranging from 8-10/10 even with fentanyl 125 mcg patch. Dilaudid IV q3h prn reduces it to 5/10 but does not last.  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 No  history of narcotic abuse, but she has been on narcotics for abdominal and radiation pain from 2011 cancer surgery and complications x 6 yrs. Best regimen per pt post op at [facility] was oxycodone 15 mg q4h scheduled and dilaudid 1 mg q3h prn which she tapered down before discharge.She also took neurontin 300 mg TID when home prior to surgery for neuropathic pain (low back).  Psychosocial losses (death of father 1 week before surgery, missing teen son who is in [place] w/her mother) reduce ability to cope with pain and hospitalization.     ?? Adjustment disorder with mixed anxiety and depressed mood.  ?? Related to prolonged hospitalization and inadequately controlled pain in abdomen, back, and legs.  ?? She has had NO prior treatment for anxiety or depression or substance use disorders.       INTERVENTIONS:    ?? Supportively confronted Mysti on her refusals to try and antidepressant or use the Apps that will improve her coping with pain, and primary focus on using narcotics to control pain. I did reinforce her use of liquid neurontin and encouraged her to tell her MD about her response to it so he can adjust dosage if needed.  Focused on cognitive restructuring of absolutistic way of phrasing expectations of self and others by teaching preferential language and underlying philosophies using her unrealistic expectations of hospital staff as examples. Concepts presented include:   The connotative meanings of the words we choose to state expectations determine how we feel and whether our expectations are flexible/realistic (I want, would like, it would be better if...) or rigidly absolute and unrealistic (I/they should, must, absolutely have to...);   The laws governing behavior are not absolute, therefore, we do not have absolute control over others and the world;   Being imperfect human beings, no one can behave perfectly or make perfect decisions all the time even if we/they want to do so.   We cannot see into the future  to know what we should have done.   Gave patient cognitive restructuring handouts to read so she can become more aware of her thinking about pain and upsetting matters and restructure them into more realistic thoughts.      RECOMMENDATIONS & PLAN:     ?? DR SHAHID/ATTENDING MD:  ?? None at this time.    MEDICAL PSYCHOLOGY PLAN:     ?? I will follow up tomorrow or Friday to see if Jeyli complied with behavioral intervention homework involving looking at at least one of the 3 pain mgmt/relaxation phone Apps I showed her. She is to try some of the exercises at least briefly to help her find several that she likes.     ?? Will also continue cognitive restructuring interventions.    ?? She is to start making her legs go straight  1 hour after she takes dilaudid which maximizes her pain control. Therapy asked her to do this, but she persists in keeping her legs bent on pillows.    ?? She is to start reading the introduction to CBT chapter from Feeling Good: the new mood therapy. I do not expect her to complete it for tomorrow.    Tyronza Happe P. Minerva Ends, PhD  Licensed Insurance risk surveyor in Medical Psychology  [place] Department of Neurology & Rehabilitation Medicine  Office Phone for non-urgent messages/requests:  [phone]  Urgent issues-pager: [phone]    08/20/2015

## 2015-08-20 NOTE — Unmapped (Signed)
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 Presented to [facility] with abdominal distention and fevers.?? Identified as having pelvic and intraabdominal abscess - drained by IR?? - mixed enteric flora with no predominance.?? Readmitted to [facility] with bleeding for wound.?? Course complicated by ischemia to right foot..??     Subjective & Interval History;    Pt tearful. Complaining that she is not getting her medication on time.  Fistula pouch was leaking last night , so she could not sleep.    Denies any fever,chill, headache,N/V, CP ,SOB,Cough, Palpitation, diarrhea or constipation      Review of Systems:  10 point ROS was negative except as above.       PMH: Unchanged from H&P.   PSH: Unchanged from H&P.   Family medical history: Unchanged from H&P.   Social history: Unchanged from H&P      Scheduled Meds:  ??? clopidogrel  75 mg Per NG tube Daily 0900   ??? enoxaparin  40 mg Subcutaneous Daily 0900   ??? famotidine (PF)  20 mg Intravenous BID   ??? fentaNYL  1 patch Transdermal Q72H   ??? gabapentin  125 mg Oral 3 times per day   ??? insulin regular  0-5 Units Subcutaneous Daily 0900   ??? oxyCODONE  15 mg Oral 6 times per day     Continuous Infusions:  ??? sodium chloride 50 mL/hr (08/20/15 0053)   ??? TPN ADULT ([facility]) 69.8 mL/hr at 08/19/15 1657   ??? TPN ADULT ([facility])       PRN Meds:ALPRAZolam, alteplase, dextrose, dextrose 50 % in water (D50W), diclofenac sodium, heparin lock flush, HYDROmorphone, ipratropium-albuterol, LORazepam, ondansetron    Objective:    Vital signs in last 24 hours:  Temp:  [98.4 ??F (36.9 ??C)-99.2 ??F (37.3 ??C)] 98.4 ??F (36.9 ??C)  Heart Rate:  [121-131] 126  Resp:  [18-20] 18  BP: (97-106)/(53-63) 97/55 mmHg    Physical Examination:    GENERAL: The patient is awake,not in acute cardiorespiratory distress. Looking chronically sick.  HEENT:Atraumatic.Normocephalic.  PERRLA, EOMI. No icterus, no pallor, no jugular venous distention..No ear or nose discharge.  NECK: Supple. Trachea is in midline. No Thyromegaly  CHEST: Symmetric. Clear to auscultation, bilateral equal air entry. No wheezing or rhonchi.  CARDIOVASCULAR: S1, S2, Regular pulse. No murmur or gallops. No JVD.  ABDOMEN: Whole anterior abdomen is covered with pouch. Brownish fistula output noted.Bilateral nephrostomy tubes.  EXTREMITIES: There is + BLE edema . Very tender to touch both legs.  Right foot has dressing in place.   Skin;No bruise,No rash.No petechiae.  CNS; CN II-XII grossly intact. No focal neurological deficit.       Intake/Output last 3 shifts:  I/O last 3 completed shifts:  In: 2599.9 [P.O.:480; I.V.:186; Other:200]  Out: 1000 [Urine:1000]    Recent Labs;                           Lab name 08/12/15  0521 08/15/15  0650 08/19/15  0520   WBC 6.9 6.9 8.8   HEMOGLOBIN 7.9* 8.6* 8.7*   HEMATOCRIT 24.6* 26.2* 27.4*   MEAN CORPUSCULAR VOLUME 94.4 93.0 92.9   PLATELETS 612* 663* 638*  Lab name 08/12/15  0521 08/15/15  0650 08/19/15  0520   SODIUM 140 138 135   POTASSIUM 4.1 4.5 4.5   CREATININE 0.45* 0.40* 0.54*   BUN 23 26* 30*   CHLORIDE 107 104 100   CO2 28 28 30    PHOSPHORUS 3.8 4.3 4.3   MAGNESIUM 1.6 1.8 2.0                         Lab name 07/29/15  0409 08/05/15  0351 08/12/15  0521 08/19/15  0520   AST 22 17 14 23    ALT 22 16 15 26    BILIRUBIN TOTAL 0.3 0.2 0.2 0.3       Lab name 07/05/15  0702  07/09/15  0544  08/12/15  0521 08/15/15  0650 08/19/15  0520   PREALBUMIN 14.0*  --  16.0*  --   --   --   --    ALBUMIN  --   < > 1.5*  < > <1.5*   <1.5* <1.5* <1.5*   <1.5*   < > = values in this interval not displayed.                                                                                                   Lab name 08/15/15  0650 08/19/15  0520   CALCIUM 7.9* 8.2*   PHOSPHORUS 4.3 4.3                              Lab name 08/12/15  0521  08/19/15  0520   TRIGLYCERIDES 115 115                        Lab name 08/05/15  0900 08/07/15  0850 08/10/15  0830   VANCOMYCIN TROUGH 9.3* 9.1* 17.4   CT abdomen 3/24  1. Multiple abscess, large pelvic abscess with surgical drain,  right issue rectal fossa.  2. 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  3. Granulation tissue enhancement or blood extravasation in the  lower abdominal wall as described  4. Proximity of the small bowel vasculature at the level of the  open wound on the right, side of active bleeding  5. Lower lobe bronchopneumonia and aspiration is also considered    Diet;    Diet Orders          TPN ADULT ([facility]) at 69.8 mL/hr starting at 05/09 1800    TPN ADULT ([facility]) at 69.8 mL/hr starting at 05/08 1800    Diet NPO effective now Except for: except ice chips, except sips of clears starting at 03/28 7829          Future Appointments:  No future appointments.    Assessment/Plan:      1. Peritoneal abscess- Mixed flora, pigtail drain 06/26/15 . Vanco/Zosyn/Diflucan.           -Completed  6 week Abx on 08/12/15.  F/u CT scan on 5/10  at [facility].    2. Enterocutaneous Fistula- wound care, bowel rest. Still has significant output.    3. Severe intractable acute on chronic pain syndrome- receiving Dilaudid IV every 3 hr  Prn,Fentanyl patch decreased to 50  from 75  mcg. Started PO Oxycodone scheduled as she had better response with it previously. Added Gabapentin as she was on previously.         Consulted Psychiatry for depression, chronic pain and recommended SNRIs to help with pain and depression.She want to try therapy first.     4. Thrombocytosis. uncertain etiology- follow closely. Likely reactive.    5. Cachexia-   continue TPN, recheck PAB. Albumin stays less than 1.5.    6. Sinus tachycardia. Likely related to pain. Monitor.    7. Ischemic feet/arterial thromboemboli- The patient had a clot in her femoral artery right leg that was cleared  by vascular surgery.  However the patient ended up with necrosis at her distal digits which may be a combination of lack of blood flow and the use of pressors. Refusing heparin, aspirin and Plavix. Refused all heparin injections previously as well. Canceled aspirin but continue to encourage Plavix..           Following podiatrist at [facility].    8. History of colon cancer s/p exenteration with bilateral nephrostomy tubes    9. Recurrent presacral abscesses. Completed the course of Abx.    10. Debility- PT/OT    11. Guarded prognosis.     12. On TPN. 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 Cant tolerate PO. Weekly LFT while on TPN. TPN being managed by pharmacy. Electrolytes replacement through TPN. LFT are WNL so far. Mg level was low  Pharmacist increased the dose of Magnesium. Getting better now.    13. Seen by  Surgeon on 08/13/15.  Plan to get CT scan abdomen pelvis with PO contrast at [facility] on 5/10. Pt need to be NPO 4 hr before procedure.    14. D/C planning . Family meeting held on  5/3.  Pt need extensive wound care, TPN, IV Pain meds which are hurdle to discharge.  15. Nausea. Increased frequency of Zofran.  16. Right nephrostomy tube dislodged and leaking. IR referral placed.     BUN noted elevated. Monitor. Gentle IVF.      Prophylaxis;  GI Prophylaxis: Pepcid  DVT Prophylaxis: Lovenox     Code Status: Full Code      Meline Russaw MD.       08/20/2015

## 2015-08-20 NOTE — Unmapped (Signed)
[place]    [name]   Wound Care Assessment  08/20/2015       Linda Ponce is an 45 y.o. female.  DOB: July 18, 1970    Pressure ulcers on admission:  One STAGE: III on coccyx              One UNSTAGEABLE on right lateral hip    Diagnosis: jewish 07/05/15 dx: sepsis    Allergies   Allergen Reactions   ??? Compazine [Prochlorperazine Edisylate]    ??? Latex, Natural Rubber        History:    Past Medical History   Diagnosis Date   ??? Cancer    ??? Abscess of abdominal cavity    ??? Gangrene of foot    ??? AKI (acute kidney injury)    ??? Fistula    ??? Peritonitis and retroperitoneal infections    ??? Cachexia    ??? Hyperkalemia    ??? Dehydration    ??? Encephalopathies    ??? Ischemic foot    ??? 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 Dilaudid IV q3h prn reduces it to 5/10 but does not last.  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 No hist     Past Surgical History   Procedure Laterality Date   ??? Colon surgery       Social History   Substance Use Topics   ??? Smoking status: Never Smoker    ??? Smokeless tobacco: None   ??? Alcohol Use: None     Present admission history of illness/MD note.  63f with history of colorectal CA, initially admitted to [facility] post operatively for continued wound care. Developed bleeding, sent back to [facility] where she was foundto have multiple pelvic abscesses, enterocutaneous fistulas, was kept NPO, started on IV abx, TPN, and returns for continued care.    Skin/Wound Care Assessment.    WCT assessed and changed appliances to mid abdomen fistula and right lower abd colostomy, with patient assisting with care.  Pt refused assessment on two visits this week 08/20/15 and 08/22/15 of left foot, bilateral nephrostomy tubes. Staff RN made  aware. Pt educated both during both visits on importance of wound assessment for wound healing and potential risks associated with infections and skin breakdown. Pt very anxious and tearful during both visits. Pt medicated by Staff RN with IVP PRN pain medicines at the beginning of both visits. Pt continued to refuse full wound care assessment stating Not right now and I just can't take it anymore. Pt is very anxious during wound care and is very reluctant to allow staff to try different techniques to aide in pouch wear time. Pt also becomes upset and does not want mid abdominal appliance changed, even when leaking. Pt requests pouch to be taped with pink tape instead of replacing entire pouch. Pt educated that pouch must be changed when leaking, in order to improve and maintain skin integrity surrounding fistula sites. Pt eventually agreeable to mid abdominal pouch change.     Consult placed for Florencia Reasons, NP to see pt next week.    Braden: 16     Pressure wounds:    Coccyx STAGE:???? III??   Wounds progress: RESOLVED  Wound Assessment:??Newly pithelialized, blanchable erythema.  Wound team will assess again next week, continue current tx to prevent reopening of area. Treatment/Dressing: Clean with normal saline, pat dry. Apply  Mepilex border. Change every 3 days and prn dressing failure.    _____________________________________________________________________  Surgical wounds:????????????????????????????????????????????????????????????  Mid Abdominal Wound with fistula???? Full Thickness. Measurements: 6.5 cm L x 3 cm W x 1 cm D    Wounds progress: Improving  Wound Assessment:?? Wound bed with moist bright pink tissue, granulation tissue with areas of mucosal/bowel tissue.  Well defined edges, with some epithelial tissue noted at edges. Large amount liquid gold brown effluent draining into foley bag. Periwound skin with slight erythema, blanchable. Pouch has stayed intact 2 days wear time (After 08/20/15 wound care visit-pouch lasted until 08/22/15  wound care visit).  Treatment/Dressing: Mid abdominal and left abdominal fistula/wounds being managed with Eakins pouches attached to gravity drainage bags. Clean wounds and surrounding skin with soap and water, pat dry. Attach Eakins pouch using Eakins rings to help with adherence. Use pouch [id number] for mid abdomen and smaller pouch [id number] for left abdomen. Change pouches weekly and prn for leakages.      ____________________________________________________________________  Surgical wounds:??????  ??????Left Lateral Abdomen Fistula???? Full Thickness. Measurements L 2.5?? cm X W 1 cm X?? D 1 cm    Wounds progress: No obvious drainage. UTA site-pouch C/D/I  Wound Assessment:??UTA- Patient did not want pouch changed at either visit this week-pouch is C/D/I.  Treatment/Dressing:?? Left abdominal fistula/wound being managed with Eakins pouch attach to gravity drainage bags. Clean wounds and surrounding skin with soap and water, pat dry. Attach Eakins pouch using Eakins rings to help with adherence. Used smaller pouch [id number] today  for left abdomen. Change pouches weekly and prn for leakages.       _____________________________________________________________________  Surgical wounds:????  ????????Drain of left groin ?? ??  Wounds progress:??UTA  Wound Assessment: UTA No drainage in pouch. Patient did not want pouch changed at either visit this week-pouch is C/D/I.  Treatment/Dressing: Drain to left groin is open, apply small Hollister pouch [phone] to this, change pouch weekly and prn dressing failure.       _____________________________________________________________________  Surgical wounds:??????  Bilateral nephrostomy drains,??   Wounds progress:??No change  Wound Assessment: UTA-Pt refused to allow wound team to assess at both visits this week.  Treatment/Dressing: Both to drainage pouches. Dressing changes every 3 days and prn for dressing failure. Clean sites with CHG, allow to dry.Tegaderm dressings have not been staying in place when  fistula drainage leaks on dressings. New dressing order: after clean with CHG, apply a slit Mepilex border, securing around tubing to make it occlusive, then apply another Mepilex border to insure it is occlusive and to position tubing so that patient is not lying on tubing.    Pt refused dressing changes during both wound care visits this week. Pt educated on importance of routine dressing changes and site assessment to prevent infections. Pt continued to refused dressing changes stating, Not right now.    Photos below from last week:      ____________________________________________________________________  Vascular Wounds on Right Foot????     Wounds progress:??UTA  Wound Assessment:?? Patient refusing dressing changes to right foot, attempted twice during wound care visits this week. Is also refusing tubigrip to left leg, states  I am going to do that later, not now.  Floor nurse informed.   Treatment/Dressing: Per Dr. Luan Pulling (pt had appt on 4/25) paint toes daily with betadine, keep dry and keep pressure off toes.  Also per appt notes, place 2 layers of tubi-grip to left lower leg from toes to knees - explained this to pt but she refused to allow this nurse to apply.     Pt refused dressing changes during both wound care visits this week. Pt educated on importance of routine dressing changes and site assessment to prevent infections. Pt continued to refused dressing changes stating, Not right now.    Photos below are from 08-09-15       _____________________________________________________________________  Surgical wounds:  Colostomy/mucsous fistula ????RLQ    Wound Progress:  No change, stable   Wound Assessment:??Moist red budded stoma, peri-wound clean/dry/intact. Scant amount of mucous drainage from ostomy, and scant amount thin liquid  brown drainage in pouch (unsure if this is from the colostomy or  from the mid abd fistula drainage d/t to both pouches lifting) Has some blanchable erythema of periwound  skin  Wound Treatment: Right lower quadrant colostomy currently only functioning as mucous fistula. Keep covered with small ostomy pouch Hollister # [id number] or Myna Bright [phone]. Change weekly and prn dressing failure.     Changed pouch, using the smaller Myna Bright #[id number].     ______________________________________________________________    Treatment time: 1 RN 1 hour and 40 minutes, assessment not fully completed, pt refused to allow this RN to assess nephro tubes and R foot dressing on 08/20/15.                               2 RNs 40 minutes, assessment not fully completed, pt refused to allow staff to assess nephro tubes and R foot dressing again on 08/22/15.                                            ______________________________________________________________  Preventative measures.    Off loading and pressure relief as measures are using an accumax mattress, Turning Wedge, pillows, and waffle boots to aid in off load of pressure points.  Other measures include lotion's and cream's to Hydrate and moisturize dry skin.  Moisture barrier cream Crit[employer] clear is provided to protect groin and peri anal area.. Currently has foley catheter with statlock in place.         Labs:   Lab Results   Component Value Date    GLUCOSE 75 08/19/2015    BUN 30* 08/19/2015    CO2 30 08/19/2015    CREATININE 0.54* 08/19/2015    K 4.5 08/19/2015    NA 135 08/19/2015    CL 100 08/19/2015    CALCIUM 8.2* 08/19/2015     Lab Results   Component Value Date    WBC 8.8 08/19/2015    HGB 8.7* 08/19/2015    HCT 27.4* 08/19/2015    MCV 92.9 08/19/2015    PLT 638* 08/19/2015     No results found for: PTT, INR  Lab Results   Component Value Date    PREALBUMIN 16.0* 07/09/2015     Lab Results   Component Value Date    ALBUMIN <1.5* 08/19/2015    ALBUMIN <1.5* 08/19/2015     Lab Results   Component Value Date    ESR 15 08/04/2015    CRP 96.9* 08/04/2015

## 2015-08-20 NOTE — Unmapped (Signed)
Problem: Compromised Skin Integrity  Goal: LTG - Patient will maintain/improve skin integrity through proper skin care techniques  Off loading and pressure relief measures using an AccuMax mattress, turning wedge, pillows, and waffle boots to aid in off-loading of pressure points.  Other measures include lotions and creams to hydrate and moisturize dry skin.  Moisture barrier cream [employer]icaid clear is provided to protect groin and peri anal area from incontinence.

## 2015-08-20 NOTE — Unmapped (Signed)
Patient lying bed agitated and slightly inconsolable at this time. Patient crying and requesting pain medication. This RN administer IV pain medication along with morning meds. Patient given ice chips by RN. No other needs noted at this time.

## 2015-08-20 NOTE — Unmapped (Addendum)
[facility]  Nutrition Progress Note    Nutrition Diagnosis:   1. Altered GI function - Continues    New Nutrition Diagnosis:  No    Diet: NPO with ice chips and sips of clears. Taking minimal po   TPN: Total Volume 1661ml/day rate 69.8 ml/hr:  AAcids 80g/day, Dextrose 293g/day, Lipid  35.19g/d per day to provide kcal 1668.72     Weight: (!) 89 lb 14.4 oz (40.778 kg) (08/17/15 1400)     Skin Condition:  Coccyx st 3 resolved, mid abd fistula no significant change, Left lateral abdomen fistula improved smaller in size, Mid abdominal wound with fistula improved. Right foot vascular wounds no significant change,  Right toes are gangrenous  Edema: Right LE nonpitting    Labs:  Lab Results   Component Value Date    ALT 26 08/19/2015    AST 23 08/19/2015    ALKPHOS 202* 08/19/2015    BILITOT 0.3 08/19/2015     Lab Results   Component Value Date    TRIG 115 08/19/2015     Lab Results   Component Value Date    CREATININE 0.54* 08/19/2015    BUN 30* 08/19/2015    NA 135 08/19/2015    K 4.5 08/19/2015    CL 100 08/19/2015    CO2 30 08/19/2015     Lab Results   Component Value Date    OSMOLALITY 285 08/19/2015     Lab Results   Component Value Date    CALCIUM 8.2* 08/19/2015    PHOS 4.3 08/19/2015    GLUCOSE 75 08/19/2015        Lab Results   Component Value Date    WBC 8.8 08/19/2015    HGB 8.7* 08/19/2015    HCT 27.4* 08/19/2015    MCV 92.9 08/19/2015    PLT 638* 08/19/2015         Component Value Date/Time    POCGMD 107* 08/20/2015 1227    POCGMD 106* 08/20/2015 0524    POCGMD 108* 08/19/2015 2348    POCGMD 110* 08/19/2015 0933    POCGMD 106* 08/19/2015 0051     No results found for: VITD25H      Scheduled Meds:  ??? clopidogrel  75 mg Per NG tube Daily 0900   ??? enoxaparin  40 mg Subcutaneous Daily 0900   ??? famotidine (PF)  20 mg Intravenous BID   ??? fentaNYL  1 patch Transdermal Q72H   ??? gabapentin  125 mg Oral 3 times per day   ??? insulin regular  0-5 Units Subcutaneous Daily 0900   ??? oxyCODONE  15 mg Oral 6 times per  day     Continuous Infusions:  ??? sodium chloride 50 mL/hr (08/20/15 0053)   ??? TPN ADULT ([facility]) 69.8 mL/hr at 08/19/15 1657   ??? TPN ADULT ([facility])       PRN Meds:.ALPRAZolam, alteplase, dextrose, dextrose 50 % in water (D50W), diclofenac sodium, heparin lock flush, HYDROmorphone, ipratropium-albuterol, LORazepam, ondansetron    Note: Weight stable. TPN infusing without any complications. BUN increased. Na WNL  R/T elevated BUN 5/8 of 30, patient on Normal saline IV at 50 ml/hr x 48 hrs in addition to TPN. Alk Phos  trending up, AST/ALT WNL. Cr decreased likely r/t decreased muscle mass. Blood sugars in adequate control. Ascorbic acid 200 mg added to each bag r/t GI output and wound healing. TPN provides 2.0 g/kg protein - considered adequate to aid wound healing. F/U CT scan of abdomen planned  for 5/10.    Nutrition Prescription:   Food and Nutrition Therapy: Rec continue current  TPN macronutrient levels.  Monitoring: weights, hydration,  Labs, skin condition    Plan of Care:Update/revised    Follow Up: Other:7-10 days    Mackey Birchwood RDN, LD  Pager [phone] Phone [phone]    08/20/2015 3:03 PM

## 2015-08-20 NOTE — Unmapped (Signed)
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   Adequate intake via PO and TPN to support healing of areas on skin as diagnosis allows.   Outcome: Progressing  Weight stable    Tolerates TPN without complications.  BUN elevated - NSIVF's x48hr ordered  Blood sugars in adequate control   LFT WNL x Alk phos trending up   Toes gangrenous, coccyx resolved Left lateral abdomen fistula improved smaller in size, Mid abdominal wound with fistula improved

## 2015-08-20 NOTE — Unmapped (Signed)
Problem: Compromised Skin Integrity  Goal: LTG - Patient will be free from infection  Outcome: Progressing  Patient encouraged to allow wound care team to address and treat wounds as ordered. Patient educated on importance of wound care related to benefits and risk of infection.

## 2015-08-20 NOTE — Unmapped (Signed)
Problem: Potential for Compromised Skin Integrity  Goal: Skin integrity is maintained or improved  Assess and monitor skin integrity. Identify patients at risk for skin breakdown on admission and per policy. Collaborate with interdisciplinary team and initiate plans and interventions as needed.   Outcome: Progressing  Relieve pressure to bony prominences, avoid shearing, keep skin clean and dry, turn patient, encourage use of moisturizer on skin

## 2015-08-20 NOTE — Unmapped (Signed)
Clinical Pharmacy Note - TPN Order    Linda Ponce is a 45 y.o. female that is being followed by the clinical pharmacy department for monitoring and adjustment of TPN.    Patient remains NPO with ice chips, sips of clear liquids    Plan:    -no new TPN labs today  -in response to elevated BUN 5/8 of 30, patient on Normal saline IV at 50 ml/hr x 48 hrs in addition to TPN  -continue formulation listed below-no changes    TPN Electrolytes  ??Sodium Chloride: 80 mEq  ??Sodium Acetate: 0 mEq  ??Sodium Phosphate: 0 mmol  ??Magnesium Sulfate: 18 mEq  ??Calcium Gluconate: 5 mEq  ??Potassium Chloride: 20 mEq  ??Potassium Acetate: 0 mEq  ??Potassium Phosphate: 15 mmol?? TPN Macronutrients  ??Volume: 1675 mL  ??Rate: 69.8 mL/hr  ??Amino Acids: 79.9 grams/day (47.7 g/L) ??  ??Dextrose: 293.3 grams/day (175 g/L) ??  ??Lipids: 35.19 grams/day?? (21 g/L) ?? TPN Additives  ??MVI 10 mL  ??TE 1 mL  Ascorbic acid 200 mg??           [facility] 08/20/2015 9:12 AM

## 2015-08-21 ENCOUNTER — Encounter

## 2015-08-21 ENCOUNTER — Inpatient Hospital Stay: Admit: 2015-08-21 | Attending: Surgery | Primary: Internal Medicine

## 2015-08-21 DIAGNOSIS — S31109A Unspecified open wound of abdominal wall, unspecified quadrant without penetration into peritoneal cavity, initial encounter: Secondary | ICD-10-CM

## 2015-08-21 LAB — POC GLU MONITORING DEVICE
POC Glucose Monitoring Device: 117 mg/dL (ref 70–100)
POC Glucose Monitoring Device: 117 mg/dL — ABNORMAL HIGH (ref 70–100)
POC Glucose Monitoring Device: 116 mg/dL (ref 70–100)

## 2015-08-21 MED ORDER — IOPAMIDOL 76 % IV SOLN
76 % | Freq: Once | INTRAVENOUS | Status: AC | PRN
Start: 2015-08-21 — End: 2015-08-21
  Administered 2015-08-21: 16:00:00 80 mL via INTRAVENOUS

## 2015-08-21 MED ORDER — TPN ADULT (WCH/Drake)
4 | INTRAMUSCULAR | Status: AC
Start: 2015-08-21 — End: 2015-08-22
  Administered 2015-08-21: 21:00:00 via INTRAVENOUS

## 2015-08-21 MED ORDER — oxyCODONE (ROXICODONE INTENSOL) 20 mg/mL concentrated solution 15 mg
20 | ORAL | Status: AC
Start: 2015-08-21 — End: 2015-08-31
  Administered 2015-08-22 – 2015-08-31 (×46): 15 mg via ORAL

## 2015-08-21 MED FILL — CATHFLO ACTIVASE 2 MG INTRA-CATHETER SOLUTION: 2 2 mg | Qty: 2

## 2015-08-21 MED FILL — ONDANSETRON HCL (PF) 4 MG/2 ML INJECTION SOLUTION: 4 4 mg/2 mL | INTRAMUSCULAR | Qty: 2

## 2015-08-21 MED FILL — GABAPENTIN 250 MG/5 ML ORAL SOLUTION: 50 50 mg/mL | ORAL | Qty: 5

## 2015-08-21 MED FILL — HYDROMORPHONE 2 MG/ML INJECTION SYRINGE: 2 2 mg/mL | INTRAMUSCULAR | Qty: 2

## 2015-08-21 MED FILL — FAMOTIDINE (PF) 20 MG/2 ML INTRAVENOUS SOLUTION: 20 20 mg/2 mL | INTRAVENOUS | Qty: 2

## 2015-08-21 MED FILL — OXYCODONE 5 MG/5 ML ORAL SOLUTION: 5 5 mg/5 mL | ORAL | Qty: 15

## 2015-08-21 MED FILL — TRAVASOL 10 % INTRAVENOUS SOLUTION: 10 10 % | INTRAVENOUS | Qty: 799

## 2015-08-21 MED FILL — OXYCODONE 20 MG/ML ORAL CONCENTRATE: 20 20 mg/mL | ORAL | Qty: 1

## 2015-08-21 NOTE — Unmapped (Signed)
Clinical Pharmacy Note - TPN Order    Linda Ponce is a 45 y.o. female that is being followed by the clinical pharmacy department for monitoring and adjustment of TPN.    Patient remains NPO with ice chips, sips of clear liquids    Plan:    -no new TPN labs today  -routine TPN labs due tomorrow  -continue current continuous TPN-no changes    Murphy Duzan E Octavian Godek RPH 08/21/2015 11:10 AM

## 2015-08-21 NOTE — Unmapped (Signed)
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 Presented to [facility] with abdominal distention and fevers.?? Identified as having pelvic and intraabdominal abscess - drained by IR?? - mixed enteric flora with no predominance.?? Readmitted to [facility] with bleeding for wound.?? Course complicated by ischemia to right foot..??     Subjective & Interval History;    Looking with drawn and tearful.  Going to [facility] for CT abdomen pelvis.  Pain is not under good control.  Denies any fever,chill, headache,N/V, CP ,SOB,Cough, Palpitation. EC fistula has significant output.  Denies any joint pain, skin rash.    Review of Systems:  10 point ROS was negative except as above.       PMH: Unchanged from H&P.   PSH: Unchanged from H&P.   Family medical history: Unchanged from H&P.   Social history: Unchanged from H&P      Scheduled Meds:  ??? clopidogrel  75 mg Per NG tube Daily 0900   ??? enoxaparin  40 mg Subcutaneous Daily 0900   ??? famotidine (PF)  20 mg Intravenous BID   ??? fentaNYL  1 patch Transdermal Q72H   ??? gabapentin  125 mg Oral 3 times per day   ??? insulin regular  0-5 Units Subcutaneous Daily 0900   ??? oxyCODONE  15 mg Oral 6 times per day     Continuous Infusions:  ??? sodium chloride 50 mL/hr (08/20/15 0053)   ??? TPN ADULT ([facility]) 69.8 mL/hr at 08/20/15 1735   ??? TPN ADULT ([facility])       PRN Meds:ALPRAZolam, alteplase, dextrose, dextrose 50 % in water (D50W), diclofenac sodium, heparin lock flush, HYDROmorphone, ipratropium-albuterol, LORazepam, ondansetron    Objective:    Vital signs in last 24 hours:  Temp:  [98.1 ??F (36.7 ??C)-98.6 ??F (37 ??C)] 98.6 ??F (37 ??C)  Heart Rate:  [123-126] 124  Resp:  [18] 18  BP: (97-99)/(55-58) 99/58 mmHg    Physical Examination:    GENERAL: The patient is awake,not in acute cardiorespiratory distress. Looking chronically sick.  HEENT:Atraumatic.Normocephalic.  PERRLA, EOMI. No icterus, no pallor, no jugular venous distention..No ear or nose discharge.  NECK: Supple. Trachea is in midline. No Thyromegaly  CHEST: Symmetric. Clear to auscultation, bilateral equal air entry. No wheezing or rhonchi.  CARDIOVASCULAR: S1, S2, Regular pulse. No murmur or gallops. No JVD.  ABDOMEN: Whole anterior abdomen is covered with pouch. Brownish fistula output noted.Bilateral nephrostomy tubes.  EXTREMITIES: There is + BLE edema . Very tender to touch both legs.  Right foot has dressing in place.   Skin;No bruise,No rash.No petechiae.  CNS; CN II-XII grossly intact. No focal neurological deficit.       Intake/Output last 3 shifts:  I/O last 3 completed shifts:  In: 3999.9 [P.O.:240; I.V.:1395]  Out: 625 [Urine:425; Drains:200]    Recent Labs;                           Lab name 08/12/15  0521 08/15/15  0650 08/19/15  0520   WBC 6.9 6.9 8.8   HEMOGLOBIN 7.9* 8.6* 8.7*   HEMATOCRIT 24.6* 26.2* 27.4*   MEAN CORPUSCULAR VOLUME 94.4 93.0 92.9   PLATELETS 612* 663* 638*  Lab name 08/12/15  0521 08/15/15  0650 08/19/15  0520   SODIUM 140 138 135   POTASSIUM 4.1 4.5 4.5   CREATININE 0.45* 0.40* 0.54*   BUN 23 26* 30*   CHLORIDE 107 104 100   CO2 28 28 30    PHOSPHORUS 3.8 4.3 4.3   MAGNESIUM 1.6 1.8 2.0                         Lab name 07/29/15  0409 08/05/15  0351 08/12/15  0521 08/19/15  0520   AST 22 17 14 23    ALT 22 16 15 26    BILIRUBIN TOTAL 0.3 0.2 0.2 0.3       Lab name 07/05/15  0702  07/09/15  0544  08/12/15  0521 08/15/15  0650 08/19/15  0520   PREALBUMIN 14.0*  --  16.0*  --   --   --   --    ALBUMIN  --   < > 1.5*  < > <1.5*   <1.5* <1.5* <1.5*   <1.5*   < > = values in this interval not displayed.                                                                                                   Lab name 08/15/15  0650 08/19/15  0520   CALCIUM 7.9* 8.2*   PHOSPHORUS 4.3 4.3                              Lab name 08/12/15  0521  08/19/15  0520   TRIGLYCERIDES 115 115                        Lab name 08/05/15  0900 08/07/15  0850 08/10/15  0830   VANCOMYCIN TROUGH 9.3* 9.1* 17.4   CT abdomen 3/24  1. Multiple abscess, large pelvic abscess with surgical drain,  right issue rectal fossa.  2. 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  3. Granulation tissue enhancement or blood extravasation in the  lower abdominal wall as described  4. Proximity of the small bowel vasculature at the level of the  open wound on the right, side of active bleeding  5. Lower lobe bronchopneumonia and aspiration is also considered    Diet;    Diet Orders          TPN ADULT ([facility]) at 69.8 mL/hr starting at 05/10 1800    TPN ADULT ([facility]) at 69.8 mL/hr starting at 05/09 1800    Diet NPO effective now Except for: except ice chips, except sips of clears starting at 03/28 1610          Future Appointments:  No future appointments.    Assessment/Plan:      1. Peritoneal abscess- Mixed flora, pigtail drain 06/26/15 . Vanco/Zosyn/Diflucan.           -Completed  6 week Abx on 08/12/15.  F/u CT scan at [facility]  today.     2. Enterocutaneous Fistula- wound care, bowel rest. Still has significant output.    3. Severe intractable acute on chronic pain syndrome- receiving Dilaudid IV every 3 hr  Prn,Fentanyl patch decreased to 50  from 75  mcg. Started PO Oxycodone scheduled as she had better response with it previously. Added Gabapentin as she was on previously.         Consulted Psychiatry for depression, chronic pain and recommended SNRIs to help with pain and depression.She want to try therapy first.     4. Thrombocytosis. uncertain etiology- follow closely. Likely reactive.    5. Cachexia-   continue TPN, recheck PAB. Albumin stays less than 1.5.    6. Sinus tachycardia. Likely related to pain. Monitor.    7. Ischemic feet/arterial thromboemboli- The patient had a clot in her femoral artery right leg that was cleared  by vascular surgery.  However the patient ended up with necrosis at her distal digits which may be a combination of lack of blood flow and the use of pressors. Refusing heparin, aspirin and Plavix. Refused all heparin injections previously as well. Canceled aspirin but continue to encourage Plavix..           Following podiatrist at [facility].    8. History of colon cancer s/p exenteration with bilateral nephrostomy tubes    9. Recurrent presacral abscesses. Completed the course of Abx.    10. Debility- PT/OT    11. Guarded prognosis.     12. On TPN. 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 Cant tolerate PO. Weekly LFT while on TPN. TPN being managed by pharmacy. Electrolytes replacement through TPN. LFT are WNL so far. Mg level was low  Pharmacist increased the dose of Magnesium. Getting better now.    13. D/C planning . Family meeting held on  5/3.  Pt need extensive wound care, TPN, IV Pain meds which are hurdle to discharge.    14. Nausea. Increased frequency of Zofran.    15. Right nephrostomy tube dislodged and leaking. IR referral placed. Still not scheduled.    BUN noted elevated. Monitor. Gentle IVF. Repeat cbc and bmp tomorrow.        Prophylaxis;  GI Prophylaxis: Pepcid  DVT Prophylaxis: Lovenox     Code Status: Full Code      Bera Pinela MD.       08/21/2015

## 2015-08-21 NOTE — Unmapped (Signed)
 Nursing Clinical Progress Note        Patient Active Problem List    Diagnosis Date Noted   ??? Adjustment disorder with mixed anxiety and depressed mood 08/06/2015   ??? Pain of multiple sites 08/06/2015   ??? Cognitive dysfunction-recent onset. 08/06/2015   ??? 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 Malnutrition 07/08/2015   ??? Fistula 07/08/2015   ??? Abdominal abscess 07/04/2015       Filed Vitals:    08/20/15 0600 08/20/15 1700 08/21/15 0012 08/21/15 1037   BP: 99/53 97/55 99/57  99/58   Pulse: 131 126 123 124   Temp: 99.2 ??F (37.3 ??C) 98.4 ??F (36.9 ??C) 98.1 ??F (36.7 ??C) 98.6 ??F (37 ??C)   TempSrc: Oral Oral Oral Oral   Resp: 20 18 18 18    Height:       Weight:       SpO2: 96% 99% 100% 100%       Lines/Drains/Access - Yes   If yes, please list  R triple lumen subclavian    Does the patient have any changes to their condition warranting intervention? No       RN Shift Note Additional: Patient alert and oriented x 4. Complaints of pain relieved with routine and PRN medications. See flow sheets per assessment. Patient left facility at approximately 1100 for appointment, returned to unit via first care without incident. TPN continues per orders, patient tolerates well. Fistula dressings in place and intact. Bed in low position. Non-slip socks provided. Call light within reach.     Does the patient's have any transfers/appointments or testing scheduled -   No future appointments.    Patient/Family questions - No     MARIE , RN

## 2015-08-21 NOTE — Unmapped (Signed)
Attempted to see patient to see if she followed through with reviewing the pain management Apps I showed her. She had just received dilaudid and was sound asleep. I asked her RN to tell her that I stopped by and would see her when I return on Friday. Also asked him to inform her that I will be asking what she thought of the Apps.    I will follow up with her on Friday.    Linda Ponce P. Minerva Ends, PhD  Licensed Insurance risk surveyor in Medical Psychology  [place] [facility] of Neurology & Rehabilitation Medicine  Division of Neuropsychology & Medical Psychology  Office Phone for non-urgent messages/requests:  [phone]  Pager--urgent issues only: [phone]    08/21/2015

## 2015-08-21 NOTE — Unmapped (Signed)
Alert and oriented. Complaint of pain. Gave prn pain meds with relief. TPN running without complaint.

## 2015-08-21 NOTE — Unmapped (Signed)
Problem: Fall Prevention  Goal: Patient will remain free of falls  Assess and monitor vitals signs, neurological status including level of consciousness and orientation. Reassess fall risk per hospital policy.    Ensure arm band on, uncluttered walking paths in room, adequate room lighting, call light and overbed table within reach, bed in low position, wheels locked, side rails up per policy, and non-skid footwear provided.   Outcome: Progressing  Patient in bed, 2/4 side rails up and in place. Non-slip socks on as tolerated while in bed. Door to room open as tolerated, fall risk sign in door way. Toileting assistance offered every two hours and as needed. Bed in low position. Call light within reach.

## 2015-08-21 NOTE — Unmapped (Signed)
Problem: Compromised Skin Integrity  Goal: LTG - Patient will be free from infection  Outcome: Progressing  Monitor vital signs and lab values for signs of infection.

## 2015-08-22 LAB — RENAL FUNCTION PANEL W/EGFR
Albumin: <1.5 g/dL (ref 3.5–5.7)
Anion Gap: 2 mmol/L (ref 3–16)
BUN: 24 mg/dL (ref 7–25)
CO2: 27 mmol/L (ref 21–33)
Calcium: 7.5 mg/dL (ref 8.6–10.3)
Chloride: 107 mmol/L (ref 98–110)
Creatinine: 0.42 mg/dL (ref 0.60–1.30)
Glucose: 83 mg/dL (ref 70–100)
Osmolality, Calculated: 285 mOsm/kg (ref 278–305)
Phosphorus: 3.5 mg/dL (ref 2.1–4.7)
Potassium: 4.3 mmol/L (ref 3.5–5.3)
Sodium: 136 mmol/L (ref 133–146)
eGFR AA CKD-EPI: >90 See note.
eGFR NONAA CKD-EPI: >90 See note.

## 2015-08-22 LAB — CBC
Hematocrit: 25.4 % — ABNORMAL LOW (ref 35.0–45.0)
Hemoglobin: 7.9 g/dL — ABNORMAL LOW (ref 11.7–15.5)
MCH: 29.2 pg (ref 27.0–33.0)
MCHC: 30.9 g/dL — ABNORMAL LOW (ref 32.0–36.0)
MCV: 94.3 fL (ref 80.0–100.0)
MPV: 6.9 fL — ABNORMAL LOW (ref 7.5–11.5)
Platelets: 418 10E3/uL — ABNORMAL HIGH (ref 140–400)
RBC: 2.69 10E6/uL — ABNORMAL LOW (ref 3.80–5.10)
RDW: 16.7 % — ABNORMAL HIGH (ref 11.0–15.0)
WBC: 6.6 10E3/uL (ref 3.8–10.8)

## 2015-08-22 LAB — POC GLU MONITORING DEVICE
POC Glucose Monitoring Device: 135 mg/dL — ABNORMAL HIGH (ref 70–100)
POC Glucose Monitoring Device: 123 mg/dL — ABNORMAL HIGH (ref 70–100)

## 2015-08-22 LAB — MAGNESIUM: Magnesium: 1.9 mg/dL (ref 1.5–2.5)

## 2015-08-22 MED ORDER — TPN ADULT (WCH/Drake)
10 | INTRAVENOUS | Status: AC
Start: 2015-08-22 — End: 2015-08-23
  Administered 2015-08-22: 22:00:00 via INTRAVENOUS

## 2015-08-22 MED FILL — HYDROMORPHONE 2 MG/ML INJECTION SYRINGE: 2 2 mg/mL | INTRAMUSCULAR | Qty: 2

## 2015-08-22 MED FILL — ONDANSETRON HCL (PF) 4 MG/2 ML INJECTION SOLUTION: 4 4 mg/2 mL | INTRAMUSCULAR | Qty: 2

## 2015-08-22 MED FILL — TRAVASOL 10 % INTRAVENOUS SOLUTION: 10 10 % | INTRAVENOUS | Qty: 799

## 2015-08-22 MED FILL — OXYCODONE 20 MG/ML ORAL CONCENTRATE: 20 20 mg/mL | ORAL | Qty: 1

## 2015-08-22 MED FILL — FAMOTIDINE (PF) 20 MG/2 ML INTRAVENOUS SOLUTION: 20 20 mg/2 mL | INTRAVENOUS | Qty: 2

## 2015-08-22 MED FILL — GABAPENTIN 250 MG/5 ML ORAL SOLUTION: 50 50 mg/mL | ORAL | Qty: 5

## 2015-08-22 NOTE — Unmapped (Signed)
Problem: Discharge Planning  Goal: Patient???s discharge needs are met  Collaborate with interdisciplinary team and initiate plans and interventions as needed.   Outcome: Progressing  Ongoing discharge planning    Comments:   The case management department provided an update regarding patient???s plan of care today during Interdisciplinary Rounds.    With: Therapy, WCT, Dietician, SW, RNCM    Discharge recommendation is to SNF    Barriers to discharge at this time include: IV medication

## 2015-08-22 NOTE — Unmapped (Signed)
Clinical Pharmacy Note - TPN Order    Linda Ponce is a 45 y.o. female that is being followed by the clinical pharmacy department for monitoring and adjustment of TPN.    Patient remains NPO with ice chips, sips of clear liquids    Plan:    -adjusted Calcium 9.5; has been trending down, will increase Calcium gluconate to 9.6 mEq  -phosphorus has trended down to 3.5-increase potassium phosphate slightly to 18 mM  -am glucose 135-a little higher than previous but WNL for TPN patient  -Magnesium 1.9; WNL  -continue current continuous TPN with above changes  -formulation listed below    Sodium Chloride: 80 mEq  ??Sodium Acetate: 0 mEq  ??Sodium Phosphate: 0 mmol  ??Magnesium Sulfate: 18 mEq  ??Calcium Gluconate: 9.6 mEq  ??Potassium Chloride: 20 mEq  ??Potassium Acetate: 0 mEq  ??Potassium Phosphate: 18 mmol???? TPN Macronutrients  ??Volume: 1675 mL  ??Rate: 69.8 mL/hr  ??Amino Acids: 79.9 grams/day (47.7 g/L) ??  ??Dextrose: 293.3 grams/day (175 g/L) ??  ??Lipids: 35.19 grams/day?? (21 g/L) ???? TPN Additives  ??MVI 10 mL  ??TE 1 mL  Ascorbic acid 200 mg????   ??                         SODIUM   Date Value Ref Range Status   08/22/2015 136 133 - 146 mmol/L Final     CHLORIDE   Date Value Ref Range Status   08/22/2015 107 98 - 110 mmol/L Final     CO2   Date Value Ref Range Status   08/22/2015 27 21 - 33 mmol/L Final     POTASSIUM   Date Value Ref Range Status   08/22/2015 4.3 3.5 - 5.3 mmol/L Final     Comment:     Hemolysis Present:  Results may be influenced artificially.  Recommend recollection as clinically indicated.     PHOSPHORUS   Date Value Ref Range Status   08/22/2015 3.5 2.1 - 4.7 mg/dL Final     CALCIUM   Date Value Ref Range Status   08/22/2015 7.5* 8.6 - 10.3 mg/dL Final     GLUCOSE   Date Value Ref Range Status   08/22/2015 83 70 - 100 mg/dL Final       Linda Ponce [facility] [address] 08/22/2015 9:28 AM

## 2015-08-22 NOTE — Unmapped (Signed)
Problem: Fall Prevention  Goal: Patient will remain free of falls  Assess and monitor vitals signs, neurological status including level of consciousness and orientation. Reassess fall risk per hospital policy.    Ensure arm band on, uncluttered walking paths in room, adequate room lighting, call light and overbed table within reach, bed in low position, wheels locked, side rails up per policy, and non-skid footwear provided.   Outcome: Progressing  Patient in bed, 2/4 side rails up and in place. Non-slip socks on feet at all times while out of bed. Door to room open as tolerated. Fall risk sign in door way. Toileting assistance offered every two hours and as needed. Bed in low position. Call light within reach.

## 2015-08-22 NOTE — Unmapped (Signed)
Alert and oriented. Continues to ask for pain meds every 3 hours. IV dilaudid given with relief. Resting quietly.

## 2015-08-22 NOTE — Unmapped (Signed)
 Nursing Clinical Progress Note        Patient Active Problem List    Diagnosis Date Noted   ??? Adjustment disorder with mixed anxiety and depressed mood 08/06/2015   ??? Pain of multiple sites 08/06/2015   ??? Cognitive dysfunction-recent onset. 08/06/2015   ??? 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 Malnutrition 07/08/2015   ??? Fistula 07/08/2015   ??? Abdominal abscess 07/04/2015       Filed Vitals:    08/21/15 1037 08/21/15 1841 08/22/15 0700 08/22/15 1115   BP: 99/58 96/57 90/47  89/55   Pulse: 124 124 115 110   Temp: 98.6 ??F (37 ??C) 98.6 ??F (37 ??C) 98.1 ??F (36.7 ??C) 98.4 ??F (36.9 ??C)   TempSrc: Oral Oral Oral Oral   Resp: 18 20 18 27    Height:       Weight:       SpO2: 100% 100% 100% 98%       Lines/Drains/Access - Yes   If yes, please list  R triple subclavian    Does the patient have any changes to their condition warranting intervention? No       RN Shift Note Additional: Patient alert and oriented x 4. Complaints of pain relieved with routine and PRN medications. See flow sheets per assessment. TPN continues per orders, patient tolerates well. Fistula pouches in place and intact. Wound care preformed by wound care team. Bed in low position. Non-slip socks provided. Call light within reach.     Does the patient's have any transfers/appointments or testing scheduled -   No future appointments.    Patient/Family questions - No     MARIE , RN

## 2015-08-22 NOTE — Unmapped (Signed)
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 Presented to [facility] with abdominal distention and fevers.?? Identified as having pelvic and intraabdominal abscess - drained by IR?? - mixed enteric flora with no predominance.?? Readmitted to [facility] with bleeding for wound.?? Course complicated by ischemia to right foot..??     Subjective & Interval History;    Looking withdrawn.  Pain is under better control today.  Right foot hurting as before.  EC pouch was leaking.    Denies any fever,chill, headache,N/V, CP ,SOB,Cough, Palpitation, diarrhea or constipation.    Review of Systems:  10 point ROS was negative except as above.       PMH: Unchanged from H&P.   PSH: Unchanged from H&P.   Family medical history: Unchanged from H&P.   Social history: Unchanged from H&P      Scheduled Meds:  ??? clopidogrel  75 mg Per NG tube Daily 0900   ??? enoxaparin  40 mg Subcutaneous Daily 0900   ??? famotidine (PF)  20 mg Intravenous BID   ??? fentaNYL  1 patch Transdermal Q72H   ??? gabapentin  125 mg Oral 3 times per day   ??? insulin regular  0-5 Units Subcutaneous Daily 0900   ??? oxyCODONE  15 mg Oral 6 times per day     Continuous Infusions:  ??? TPN ADULT ([facility]) 69.8 mL/hr at 08/21/15 1657   ??? TPN ADULT ([facility])       PRN Meds:ALPRAZolam, alteplase, dextrose, dextrose 50 % in water (D50W), diclofenac sodium, heparin lock flush, HYDROmorphone, ipratropium-albuterol, LORazepam, ondansetron    Objective:    Vital signs in last 24 hours:  Temp:  [98.1 ??F (36.7 ??C)-98.6 ??F (37 ??C)] 98.4 ??F (36.9 ??C)  Heart Rate:  [110-124] 110  Resp:  [18-27] 27  BP: (89-96)/(47-57) 89/55 mmHg    Physical Examination:    GENERAL: The patient is awake,not in acute cardiorespiratory distress. Looking chronically sick.  HEENT:Atraumatic.Normocephalic. PERRLA, EOMI. No icterus, no pallor, no jugular venous distention..No ear  or nose discharge.  NECK: Supple. Trachea is in midline. No Thyromegaly  CHEST: Symmetric. Clear to auscultation, bilateral equal air entry. No wheezing or rhonchi.  CARDIOVASCULAR: S1, S2, Regular pulse. No murmur or gallops. No JVD.  ABDOMEN: Whole anterior abdomen is covered with pouch. Brownish fistula output noted.Bilateral nephrostomy tubes.  EXTREMITIES: There is + BLE edema . Very tender to touch both legs.  Right foot has dressing in place.   Skin;No bruise,No rash.No petechiae.  CNS; CN II-XII grossly intact. No focal neurological deficit.       Intake/Output last 3 shifts:  I/O last 3 completed shifts:  In: 3188 [P.O.:120; I.V.:1483]  Out: 1850 [Urine:1850]    Recent Labs;                           Lab name 08/15/15  0650 08/19/15  0520 08/22/15  0506   WBC 6.9 8.8 6.6   HEMOGLOBIN 8.6* 8.7* 7.9*   HEMATOCRIT 26.2* 27.4* 25.4*   MEAN CORPUSCULAR VOLUME 93.0 92.9 94.3   PLATELETS 663* 638* 418*  Lab name 08/15/15  0650 08/19/15  0520 08/22/15  0506   SODIUM 138 135 136   POTASSIUM 4.5 4.5 4.3   CREATININE 0.40* 0.54* 0.42*   BUN 26* 30* 24   CHLORIDE 104 100 107   CO2 28 30 27    PHOSPHORUS 4.3 4.3 3.5   MAGNESIUM 1.8 2.0 1.9                         Lab name 07/29/15  0409 08/05/15  0351 08/12/15  0521 08/19/15  0520   AST 22 17 14 23    ALT 22 16 15 26    BILIRUBIN TOTAL 0.3 0.2 0.2 0.3       Lab name 07/05/15  0702  07/09/15  0544  08/15/15  0650 08/19/15  0520 08/22/15  0506   PREALBUMIN 14.0*  --  16.0*  --   --   --   --    ALBUMIN  --   < > 1.5*  < > <1.5* <1.5*   <1.5* <1.5*   < > = values in this interval not displayed.                                                                                                   Lab name 08/19/15  0520 08/22/15  0506   CALCIUM 8.2* 7.5*   PHOSPHORUS 4.3 3.5                              Lab name 08/12/15  0521 08/19/15  0520   TRIGLYCERIDES 115 115                        Lab name 08/05/15  0900  08/07/15  0850 08/10/15  0830   VANCOMYCIN TROUGH 9.3* 9.1* 17.4   CT abdomen 3/24  1. Multiple abscess, large pelvic abscess with surgical drain,  right issue rectal fossa.  2. 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  3. Granulation tissue enhancement or blood extravasation in the  lower abdominal wall as described  4. Proximity of the small bowel vasculature at the level of the  open wound on the right, side of active bleeding  5. Lower lobe bronchopneumonia and aspiration is also considered    Diet;    Diet Orders          TPN ADULT ([facility]) at 69.8 mL/hr starting at 05/11 1800    TPN ADULT ([facility]) at 69.8 mL/hr starting at 05/10 1800    Diet NPO effective now Except for: except ice chips, except sips of clears starting at 03/28 0959          Future Appointments:  No future appointments.    Assessment/Plan:      1. Peritoneal abscess- Mixed flora, pigtail drain 06/26/15 . Vanco/Zosyn/Diflucan.           -Completed  6 week Abx on 08/12/15.  F/u CT scan at [facility] today.  2. Enterocutaneous Fistula- wound care, bowel rest. Still has significant output.    3. Severe intractable acute on chronic pain syndrome- receiving Dilaudid IV every 3 hr  Prn,Fentanyl patch decreased to 50  from 75  mcg. Started PO Oxycodone scheduled as she had better response with it previously. Added Gabapentin as she was on previously.         Consulted Psychiatry for depression, chronic pain and recommended SNRIs to help with pain and depression.She want to try therapy first.     4. Thrombocytosis. uncertain etiology- follow closely. Likely reactive.    5. Cachexia-   continue TPN, recheck PAB. Albumin stays less than 1.5.    6. Sinus tachycardia. Likely related to pain. Monitor.    7. Ischemic feet/arterial thromboemboli- The patient had a clot in her femoral artery right leg that was cleared by vascular surgery.  However the patient ended up with necrosis at her distal digits  which may be a combination of lack of blood flow and the use of pressors. Refusing heparin, aspirin and Plavix. Refused all heparin injections previously as well. Canceled aspirin but continue to encourage Plavix..           Following podiatrist at [facility]    8. History of colon cancer s/p exenteration with bilateral nephrostomy tubes    9. Recurrent presacral abscesses. Completed the course of Abx.    10. Debility- PT/OT    11. Guarded prognosis.     12. On TPN. 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 Cant tolerate PO. Weekly LFT while on TPN. TPN being managed by pharmacy. Electrolytes replacement through TPN. LFT are WNL so far. Mg level was low  Pharmacist increased the dose of Magnesium. Getting better now.    13. D/C planning . Family meeting held on  5/3.  Pt need extensive wound care, TPN, IV Pain meds which are hurdle to discharge.    14. Nausea. Increased frequency of Zofran.    15. Right nephrostomy tube dislodged and leaking. IR referral placed. Still not scheduled.    BUN noted elevated. Monitor. Gentle IVF. Getting better.  Pt was seen in Surgeon office yesterday. CT scan was done. No new recommendation.  They will call for f/u visit once surgeon review the ct scan.          Prophylaxis;  GI Prophylaxis: Pepcid  DVT Prophylaxis: Lovenox     Code Status: Full Code      Amai Cappiello MD.       08/22/2015

## 2015-08-22 NOTE — Unmapped (Signed)
Problem: Compromised Skin Integrity  Goal: LTG - Patient will be free from infection  Outcome: Progressing  Monitor vital signs and lab values for signs of infection.

## 2015-08-22 NOTE — Unmapped (Signed)
Occupational Therapy  [facility] for Post Acute [facility]  Occupational Therapy      Name: Linda Ponce  DOB: 1971-02-25  Attending Physician: Valda Lamb, MD  Admission Diagnosis: jewish 07/05/15 dx: sepsis  Date: 08/22/2015    Reviewed Pertinent hospital course: Yes   Hospital Course PT/OT: Hospital Course PT/OT: 45f with history of colorectal CA, initially admitted to [facility] post operatively for continued wound care. Developed bleeding, sent back to [facility] where she was foundto have multiple pelvic abscesses, enterocutaneous fistulas, was kept NPO, started on IV abx, TPN, and returns for continued care. PRECAUTIONS: FULL CODE, AAT, NO WEIGHT BEARING RESTRICTIONS, ISCHEMIC R TOES, MULTIPLE DRAINS FROM ABDOMEN AND BACK, (updated 08/05/14-pt now NWB on B LEs x 2 weeks per Dr. Luan Pulling)       Brief Assessment  Assessment  Assessment: Decreased ADL status, Decreased self-care trans, Decreased Functional Mobility, Decreased activity tolerance  Prognosis: Guarded  Goal Formulation: Other (comment) (per therapist )    Goals  Pt Will demonstrate functional chair transfer: Moderate (via slliding board.)  Pt Will demonstrate grooming task: Stand by assist (at w/c level.)  Pt Will participate in upper extremity HEP to prep for ADLs: strengthening / activity tolerance for functional use: Ongoing   Miscellaneous Goal #1:  (Patient will tolerate sitting EOB with maximal assist by September 16, 2015)  Long Term Goal : Patient to be seen 2-3 week trial period to determine if she can participate and benefit from active OT programming - goal met, pt ability to participate is limited by anxiety and avoidance. New Goal: Pt to be Mod A with sliding board transfer  by September 16, 2015  Time frame for goals to be met in: By September 16, 2015    Recommendation  Plan  Treatment Interventions: Compensatory technique education, Therapeutic Activity, Patient/Family training, Excercise, ADL retraining, Functional transfer training, Activity Tolerance  training, Energy Conservation  OT Frequency: 1-3x/wk    Cognition  Overall Cognitive Status: Within Functional Limits  Orientation Level: Oriented X4    Pain  Pain Score:   8  Pain Location: Generalized  Pain Descriptors: Aching;Discomfort  Pain Intervention(s): Medication (See eMAR) (routine medication)     Mobility  Bed Mobility  Rolling: Min assist to right;Min assist to left  Supine to Sit: Max assist to left  Functional Transfers  Bed to Chair: Max assist to left (x 3 per pt request as she did not want her feet touching the floor)       ADL  Where Assessed: Supine, bed  Grooming Assistance:  (Completed care with wounds and stopped drainage)  UE Dressing Assistance: Modified independent (Device)  UE Dressing Deficit: Setup     Pt co treated with PT increase assistance and encouragement needed for functional mobility. Pt very anxious again, requiring constant education and encouragement to participate with therapy as pt continued to refuse. Pt after 30 mins of crying and reassurance finally agreed to get out of bed to a wheelchair. Pt completed ADLs listed above and is efficient with wound care. Refer above also for functional transfer. Pt left up in wheelchair with staff around her in tv room; all needs met and in reach.  Cont per POC in colboration with OTR/L  [name], COTA/L    Patient Education:  Refer above    Time  Start Time: 1305  Stop Time: 1405  Time Calculation (min): 60 min    Charges       $Therapeutic Activity: 23-37 mins  $  Self Care/ADL/Home Management Training: 23-37 mins

## 2015-08-22 NOTE — Telephone Encounter (Signed)
Message made in error

## 2015-08-22 NOTE — Telephone Encounter (Signed)
Erica Harding from [facility] calling .She states that Dr. Rodney Booze spoke with Dr. Jamse Belfast about the patients abcess drains. She would like to go

## 2015-08-23 LAB — POC GLU MONITORING DEVICE: POC Glucose Monitoring Device: 105 mg/dL — ABNORMAL HIGH (ref 70–100)

## 2015-08-23 MED ORDER — clopidogrel (PLAVIX) tablet 75 mg
75 | Freq: Every day | ORAL | Status: AC
Start: 2015-08-23 — End: 2015-09-13
  Administered 2015-09-05: 13:00:00 75 mg

## 2015-08-23 MED ORDER — TPN ADULT (WCH/Drake)
4 | INTRAMUSCULAR | Status: AC
Start: 2015-08-23 — End: 2015-08-24
  Administered 2015-08-23: 21:00:00 via INTRAVENOUS

## 2015-08-23 MED FILL — OXYCODONE 20 MG/ML ORAL CONCENTRATE: 20 20 mg/mL | ORAL | Qty: 1

## 2015-08-23 MED FILL — GABAPENTIN 250 MG/5 ML ORAL SOLUTION: 50 50 mg/mL | ORAL | Qty: 5

## 2015-08-23 MED FILL — FAMOTIDINE (PF) 20 MG/2 ML INTRAVENOUS SOLUTION: 20 20 mg/2 mL | INTRAVENOUS | Qty: 2

## 2015-08-23 MED FILL — HYDROMORPHONE 2 MG/ML INJECTION SYRINGE: 2 2 mg/mL | INTRAMUSCULAR | Qty: 2

## 2015-08-23 MED FILL — ONDANSETRON HCL (PF) 4 MG/2 ML INJECTION SOLUTION: 4 4 mg/2 mL | INTRAMUSCULAR | Qty: 2

## 2015-08-23 MED FILL — FENTANYL 50 MCG/HR TRANSDERMAL PATCH: 50 50 mcg/hr | TRANSDERMAL | Qty: 1

## 2015-08-23 MED FILL — TRAVASOL 10 % INTRAVENOUS SOLUTION: 10 10 % | INTRAVENOUS | Qty: 799

## 2015-08-23 NOTE — Unmapped (Signed)
Problem: Knowledge Deficit  Goal: Patient/family/caregiver demonstrates understanding of disease process, treatment plan, medications, and discharge instructions  Complete learning assessment and assess knowledge base.   Intervention: Provide teaching at level of understanding  Educate meds

## 2015-08-23 NOTE — Unmapped (Addendum)
[facility] Inpatient Medical Psychology Service  [place] Department of Neurology & Rehabilitation Medicine  Division of Neuropsychology & Medical Psychology     INPATIENT MEDICAL PSYCHOLOGY PROGRESS NOTE    Patient Name:  Linda Ponce          DOB:  Jun 14, 1970  Admit Date:  07/08/2015   CSN:  [id number]    Referring Provider:  Valda Lamb, MD  Location:  [name] Room N319/DN319-02     Current Date:  08/23/2015  Time and Duration of Service: 9:15p-9:35p = 20 minutes face to face time     CPT Code:  29562 - Health & Behavior Intervention - Individual 1 units, 20 minutes face to face time    Current Problem List:      Patient Active Problem List   Diagnosis   ??? Abdominal abscess   ??? Enterocutaneous fistula   ??? Malnutrition   ??? Fistula   ??? Adjustment disorder with mixed anxiety and depressed mood   ??? Pain of multiple sites   ??? Cognitive dysfunction-recent onset.       Current Medications:     Scheduled Medications:    ??? [START ON 08/28/2015] clopidogrel  75 mg Per NG tube Daily 0900   ??? enoxaparin  40 mg Subcutaneous Daily 0900   ??? famotidine (PF)  20 mg Intravenous BID   ??? fentaNYL  1 patch Transdermal Q72H   ??? gabapentin  125 mg Oral 3 times per day   ??? insulin regular  0-5 Units Subcutaneous Daily 0900   ??? oxyCODONE  15 mg Oral 6 times per day     PRN Medications:  ALPRAZolam, alteplase, dextrose, dextrose 50 % in water (D50W), diclofenac sodium, heparin lock flush, HYDROmorphone, ipratropium-albuterol, LORazepam, ondansetron    Objective Presentation/Subjective Concerns and Related Interventions:      ?? Pt presented as very drowsy due to pain meds. She usually is awake this time of evening. She declined active therapy session but allowed more passive participation in finding new relaxation Apps for he to review (lost ones I found for her Wed). Staff reported refusing most therapies and anything that required that she move.   ?? Had a CT of abdomen at [facility]. Care  everywhere is frozen so cannot read results.  ?? Mood seemed quite depressed though she still prefers non-depressive explanations.    Psychiatric Diagnoses:      ?? Cognitive dysfunction--recent onset.    ?? Likely residual of significant delirium at [facility], pain medications, distraction from pain itself, and possibly IV ABX. Will monotor. Primarily involves orientation to time, and somewhat to place, short term recall, and working memory. Expected to improve as pt's wounds and infections improve.  ?? MMSE-2 score of 21/20 (T=21) indicates moderate cognitive impairment involving orientation, short term recall, and working memory.   ?? Clinical assessment of common sense reasoning skills indicated normal limits reasoning skills except when her pain gets severe, at which point she becomes fearful and suspicious about medications being withheld because she bother's the nurses too much.    ?? Pain of Multiple Sites.    ?? 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 Pt reports severe pain ranging from 8-10/10 even with fentanyl 125 mcg patch. Dilaudid IV q3h prn reduces  it to 5/10 but does not last.  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 No history of narcotic abuse, but she has been on narcotics for abdominal and radiation pain from 2011 cancer surgery and complications x 6 yrs. Best regimen per pt post op at [facility] was oxycodone 15 mg q4h scheduled and dilaudid 1 mg q3h prn which she tapered down before discharge.She also took neurontin 300 mg TID when home prior to surgery for neuropathic pain (low back).  Psychosocial losses (death of father 1 week before surgery, missing teen son who is in [place] w/her mother) reduce ability to cope with pain  and hospitalization.     ?? Adjustment disorder with mixed anxiety and depressed mood.  ?? Related to prolonged hospitalization and inadequately controlled pain in abdomen, back, and legs.  ?? She has had NO prior treatment for anxiety or depression or substance use disorders.       INTERVENTIONS:    ?? Pt was very drowsy due to pain meds; intervention tonight was limited to identification of Apps for her to review (she lost prior ones identified). She agreed to open them, try some exercises, and be ready to discuss them on Monday.  ?? Discussed need to also address her refusals of therapy due to fear of pain.    RECOMMENDATIONS & PLAN:     ?? DR SHAHID/ATTENDING MD:  ?? None at this time.    MEDICAL PSYCHOLOGY PLAN:     ?? I will follow up Monday to see if Linda Ponce complied with behavioral intervention homework involving looking at at least one of the 5 pain mgmt/relaxation phone Apps I showed her. She is to try some of the exercises at least briefly to help her find several that she likes.     ?? Will also continue/resume cognitive restructuring interventions. Will also address fear of activity that can cause pain.    ?? She is to start reading the introduction to CBT chapter from Feeling Good: the new mood therapy. I do not expect her to complete it for tomorrow.    Camron Essman P. Minerva Ends, PhD  Licensed Insurance risk surveyor in Medical Psychology  [place] Department of Neurology & Rehabilitation Medicine  Office Phone for non-urgent messages/requests:  [phone]  Urgent issues-pager: [phone]    08/23/2015

## 2015-08-23 NOTE — Unmapped (Signed)
Assessment completed call lightwithin reach call light withi reach continues with iv meds

## 2015-08-23 NOTE — Unmapped (Signed)
Clinical Pharmacy Note - TPN Order    Linda Ponce is a 45 y.o. female that is being followed by the clinical pharmacy department for monitoring and adjustment of TPN.    Patient remains NPO with ice chips, sips of clear liquids    Plan:    -no new TPN labs today  -continue current continuous TPN  -per progress notes patient having no problems with TPN infusion  -current formulation listed below    Sodium Chloride: 80 mEq  ??Sodium Acetate: 0 mEq  ??Sodium Phosphate: 0 mmol  ??Magnesium Sulfate: 18 mEq  ??Calcium Gluconate: 9.6 mEq  ??Potassium Chloride: 20 mEq  ??Potassium Acetate: 0 mEq  ??Potassium Phosphate: 18 mmol?????? TPN Macronutrients  ??Volume: 1675 mL  ??Rate: 69.8 mL/hr  ??Amino Acids: 79.9 grams/day (47.7 g/L) ??  ??Dextrose: 293.3 grams/day (175 g/L) ??  ??Lipids: 35.19 grams/day?? (21 g/L) ?????? TPN Additives  ??MVI 10 mL  ??TE 1 mL  Ascorbic acid 200 mg??????   ????                    Arvel Oquinn E Makoto Sellitto [facility] 08/23/2015 9:16 AM

## 2015-08-23 NOTE — Unmapped (Signed)
Physical Therapy                                              Physical Therapy Treatment     Name: Linda Ponce  DOB: 10-Sep-1970  Attending Physician: Valda Lamb, MD  Admission Diagnosis: jewish 07/05/15 dx: sepsis  Date: 08/23/2015    Reviewed Pertinent hospital course: Yes  Hospital Course PT/OT: Hospital Course PT/OT: 41f with history of colorectal CA, initially admitted to [facility] post operatively for continued wound care. Developed bleeding, sent back to [facility] where she was foundto have multiple pelvic abscesses, enterocutaneous fistulas, was kept NPO, started on IV abx, TPN, and returns for continued care. PRECAUTIONS: FULL CODE, AAT, NO WEIGHT BEARING RESTRICTIONS, ISCHEMIC R TOES, MULTIPLE DRAINS FROM ABDOMEN AND BACK, (updated 08/05/14-pt now NWB on B LEs x 2 weeks per Dr. Luan Pulling)     Assessment     Assessment: Impaired Bed Mobility, Impaired Transfer Mobility, Impaired Ambulation, Impaired Balance, Deconditioning, Impaired Strength, Impaired ROM  Prognosis: Guarded (due to multiple medical issues )     Pt educated at length about the importance of out of bed activities, POC, benefits of stretching. Pt declined initially stating I want to wait for my pain meds to kick in however after education and 10 min of time, pt continued to refuse therapy. Pt was offered for a RA to come in the afternoon and go through stretching and bed activities, however pt refused therapy once gain. Plan to continue skilled PT current POC as pt tolerates.     Goals  Pt Will Go Supine To Sit: Minimal (to get in and out of bed within 4 weeks. )  Sit To Stand: Min A with LRAD to prepare for gait and transfers within 6 weeks.   Pt Will Transfer Bed/Chair: Minimal (with LRAD to increase independence in home within 6 weeks. )  Pt Will Ambulate: Minimal (25' with RW to access home within 6 weeks. )  Miscellaneous Goal #1: Pt will propel w/c 150' with B UEs with supervision to increase independence in home and community within 6 weeks.    Long Term Goal : Pt will be independent with HEP to improve B LE strength to facilitate safe mobility within 8 weeks.   Time frame for goals to be met in: 8 weeks, 09/14/15    Recommendation  Plan  Treatment/Interventions: LE strengthening/ROM, Endurance training, Patient/family training, Equipment eval/education, Museum/gallery curator, Therapeutic Exercise, Therapeutic Activity, Continued evaluation, Wheelchair Mobility, Compensatory technique education, Neuromuscular Reeducation, Gait training  PT Frequency:  (2-4x/wk )    Recommendation  Recommendation: Short-term skilled PT  Equipment Recommended: Defer at this time    Problem List  Patient Active Problem List   Diagnosis   ??? Abdominal abscess   ??? Enterocutaneous fistula   ??? Malnutrition   ??? Fistula   ??? Adjustment disorder with mixed anxiety and depressed mood   ??? Pain of multiple sites   ??? Cognitive dysfunction-recent onset.        Past Medical History  Past Medical History   Diagnosis Date   ??? Cancer    ??? Abscess of abdominal cavity    ??? Gangrene of foot    ??? AKI (acute kidney injury)    ??? Fistula    ??? Peritonitis and retroperitoneal infections    ??? Cachexia    ??? Hyperkalemia    ???  Dehydration    ??? Encephalopathies    ??? Ischemic foot    ??? 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 Dilaudid IV q3h prn reduces it to 5/10 but does not last.  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 No hist        Past Surgical History  Past Surgical History   Procedure Laterality Date   ??? Colon surgery         Cognition:  Overall Cognitive Status: Within Functional Limits  Orientation Level: Oriented X4     Pain:  Pain Score:   8  Pain Location: Abdomen  Pain Descriptors: Aching  Pain Intervention(s):  Therapeutic presence  Therapist reported pain to:: Pt stated nursing staff aware of pain         Mobility: Pt declined to perform.       Exercise: Pt declined to perform.               Patient Education  Continuation of HEP, benefits of PT, POC, role of PT in treatment, overall health and negative side effects of bed rest.    Time  Start Time: 1350 (First session from 1350 to 1400, second sesion from 1405 to 1430)  Stop Time: 1430  Time Calculation (min): 40 min    Charges       $Therapeutic Activity: 22 min      Dion Saucier, Student PT

## 2015-08-23 NOTE — Unmapped (Signed)
i dont feel good dont want to fight with you but refusing  i dont want too

## 2015-08-23 NOTE — Unmapped (Signed)
Alert and oriented. Continues to complain of frequent pain. Gave prn IVP dilaudid with relief. Resting quietly.

## 2015-08-23 NOTE — Unmapped (Signed)
Problem: Knowledge Deficit  Goal: Patient/family/caregiver demonstrates understanding of disease process, treatment plan, medications, and discharge instructions  Complete learning assessment and assess knowledge base.   Discussion huddle noncompliance meds wounds activity

## 2015-08-23 NOTE — Unmapped (Signed)
Hospitalist Progress Note    08/23/2015     Linda Ponce   LOS: 46 days       HPI;    45 yr old female with underlying colorectal cancer with enteroanastomotic and enterocutaneous fistulas.?? Presented to [facility] with abdominal distention and fevers.?? Identified as having pelvic and intraabdominal abscess - drained by IR?? - mixed enteric flora with no predominance.?? Readmitted to [facility] with bleeding for wound.?? Course complicated by ischemia to right foot..??     Subjective & Interval History;    No new event  was noted.  Pain is under better control today.  Looking chronically sick.    Denies any fever,chill, headache,N/V, CP ,SOB,Cough, Palpitation, diarrhea or constipation.  Open wounds on abdomen.      Review of Systems:  10 point ROS was negative except as above.       PMH: Unchanged from H&P.   PSH: Unchanged from H&P.   Family medical history: Unchanged from H&P.   Social history: Unchanged from H&P      Scheduled Meds:  ??? clopidogrel  75 mg Per NG tube Daily 0900   ??? enoxaparin  40 mg Subcutaneous Daily 0900   ??? famotidine (PF)  20 mg Intravenous BID   ??? fentaNYL  1 patch Transdermal Q72H   ??? gabapentin  125 mg Oral 3 times per day   ??? insulin regular  0-5 Units Subcutaneous Daily 0900   ??? oxyCODONE  15 mg Oral 6 times per day     Continuous Infusions:  ??? TPN ADULT ([facility]) 69.8 mL/hr at 08/22/15 1823     PRN Meds:ALPRAZolam, alteplase, dextrose, dextrose 50 % in water (D50W), diclofenac sodium, heparin lock flush, HYDROmorphone, ipratropium-albuterol, LORazepam, ondansetron    Objective:    Vital signs in last 24 hours:  Temp:  [98 ??F (36.7 ??C)-98.4 ??F (36.9 ??C)] 98 ??F (36.7 ??C)  Heart Rate:  [110-134] 134  Resp:  [18-27] 18  BP: (89-107)/(55-60) 107/60 mmHg    Physical Examination:    GENERAL: The patient is awake,not in acute cardiorespiratory distress. Looking chronically sick.  HEENT:Atraumatic.Normocephalic. PERRLA, EOMI. No icterus, no pallor, no jugular venous distention..No ear or nose  discharge.  NECK: Supple. Trachea is in midline. No Thyromegaly  CHEST: Symmetric. Clear to auscultation, bilateral equal air entry. No wheezing or rhonchi.  CARDIOVASCULAR: S1, S2, Regular pulse. No murmur or gallops. No JVD.  ABDOMEN: Whole anterior abdomen is covered with pouch. Brownish fistula output noted.Bilateral nephrostomy tubes.  EXTREMITIES: There is + BLE edema . Very tender to touch both legs.  Right foot has dressing in place.   Skin;No bruise,No rash.No petechiae.  CNS; CN II-XII grossly intact. No focal neurological deficit.       Intake/Output last 3 shifts:  I/O last 3 completed shifts:  In: 2439 [I.V.:820]  Out: 1675 [Urine:1175; Stool:500]    Recent Labs;                           Lab name 08/15/15  0650 08/19/15  0520 08/22/15  0506   WBC 6.9 8.8 6.6   HEMOGLOBIN 8.6* 8.7* 7.9*   HEMATOCRIT 26.2* 27.4* 25.4*   MEAN CORPUSCULAR VOLUME 93.0 92.9 94.3   PLATELETS 663* 638* 418*  Lab name 08/15/15  0650 08/19/15  0520 08/22/15  0506   SODIUM 138 135 136   POTASSIUM 4.5 4.5 4.3   CREATININE 0.40* 0.54* 0.42*   BUN 26* 30* 24   CHLORIDE 104 100 107   CO2 28 30 27    PHOSPHORUS 4.3 4.3 3.5   MAGNESIUM 1.8 2.0 1.9                         Lab name 07/29/15  0409 08/05/15  0351 08/12/15  0521 08/19/15  0520   AST 22 17 14 23    ALT 22 16 15 26    BILIRUBIN TOTAL 0.3 0.2 0.2 0.3       Lab name 07/05/15  0702  07/09/15  0544  08/15/15  0650 08/19/15  0520 08/22/15  0506   PREALBUMIN 14.0*  --  16.0*  --   --   --   --    ALBUMIN  --   < > 1.5*  < > <1.5* <1.5*   <1.5* <1.5*   < > = values in this interval not displayed.                                                                                                   Lab name 08/19/15  0520 08/22/15  0506   CALCIUM 8.2* 7.5*   PHOSPHORUS 4.3 3.5                              Lab name 08/12/15  0521 08/19/15  0520   TRIGLYCERIDES 115 115                        Lab name 08/05/15  0900 08/07/15  0850  08/10/15  0830   VANCOMYCIN TROUGH 9.3* 9.1* 17.4   CT abdomen 3/24  1. Multiple abscess, large pelvic abscess with surgical drain,  right issue rectal fossa.  2. 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  3. Granulation tissue enhancement or blood extravasation in the  lower abdominal wall as described  4. Proximity of the small bowel vasculature at the level of the  open wound on the right, side of active bleeding  5. Lower lobe bronchopneumonia and aspiration is also considered    Diet;    Diet Orders          TPN ADULT ([facility]) at 69.8 mL/hr starting at 05/11 1800    Diet NPO effective now Except for: except ice chips, except sips of clears starting at 03/28 8295          Future Appointments:  No future appointments.    Assessment/Plan:      1. Peritoneal abscess- Mixed flora, pigtail drain 06/26/15 . Vanco/Zosyn/Diflucan.           -Completed  6 week Abx on 08/12/15.      2. Enterocutaneous Fistula- wound care, bowel rest. Still has significant output.    3. Severe intractable  acute on chronic pain syndrome- receiving Dilaudid IV every 3 hr  Prn,Fentanyl patch decreased to 50  from 75  mcg. Started PO Oxycodone scheduled as she had better response with it previously. Added Gabapentin as she was on previously.         Consulted Psychiatry for depression, chronic pain and recommended SNRIs to help with pain and depression.She want to try therapy first.     4. Thrombocytosis. uncertain etiology- follow closely. Likely reactive.    5. Cachexia-   continue TPN, recheck PAB. Albumin stays less than 1.5.    6. Sinus tachycardia. Likely related to pain. Monitor.    7. Ischemic feet/arterial thromboemboli- The patient had a clot in her femoral artery right leg that was cleared by vascular surgery.  However the patient ended up with necrosis at her distal digits which may be a combination of lack of blood flow and the use of pressors. Refusing heparin, aspirin and  Plavix. Refused all heparin injections previously as well. Canceled aspirin but continue to encourage Plavix..           Following podiatrist at [facility].    8. History of colon cancer s/p exenteration with bilateral nephrostomy tubes    9. Recurrent presacral abscesses. Completed the course of Abx.    10. Debility- PT/OT    11. Guarded prognosis.     12. On TPN. 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 Cant tolerate PO. Weekly LFT while on TPN. TPN being managed by pharmacy. Electrolytes replacement through TPN. LFT are WNL so far. Mg level was low  Pharmacist increased the dose of Magnesium. Getting better now.    13. D/C planning . Family meeting held on  5/3.  Pt need extensive wound care, TPN, IV Pain meds which are hurdle to discharge.    14. Nausea. Increased frequency of Zofran.    15. Right nephrostomy tube dislodged and leaking. IR referral placed. Scheduled at [facility] on 5/16.    A/P reviewed and updated.          Prophylaxis;  GI Prophylaxis: Pepcid  DVT Prophylaxis: Lovenox     Code Status: Full Code      Daymien Goth MD.       08/23/2015

## 2015-08-24 LAB — POC GLU MONITORING DEVICE: POC Glucose Monitoring Device: 118 mg/dL — ABNORMAL HIGH (ref 70–100)

## 2015-08-24 MED ORDER — TPN ADULT (WCH/Drake)
4 | INTRAVENOUS | Status: AC
Start: 2015-08-24 — End: 2015-08-25
  Administered 2015-08-24: 21:00:00 via INTRAVENOUS

## 2015-08-24 MED FILL — OXYCODONE 20 MG/ML ORAL CONCENTRATE: 20 20 mg/mL | ORAL | Qty: 1

## 2015-08-24 MED FILL — HYDROMORPHONE 2 MG/ML INJECTION SYRINGE: 2 2 mg/mL | INTRAMUSCULAR | Qty: 2

## 2015-08-24 MED FILL — FAMOTIDINE (PF) 20 MG/2 ML INTRAVENOUS SOLUTION: 20 20 mg/2 mL | INTRAVENOUS | Qty: 2

## 2015-08-24 MED FILL — GABAPENTIN 250 MG/5 ML ORAL SOLUTION: 50 50 mg/mL | ORAL | Qty: 5

## 2015-08-24 MED FILL — ONDANSETRON HCL (PF) 4 MG/2 ML INJECTION SOLUTION: 4 4 mg/2 mL | INTRAMUSCULAR | Qty: 2

## 2015-08-24 MED FILL — TRAVASOL 10 % INTRAVENOUS SOLUTION: 10 10 % | INTRAVENOUS | Qty: 799

## 2015-08-24 MED FILL — ENOXAPARIN 40 MG/0.4 ML SUBCUTANEOUS SYRINGE: 40 40 mg/0.4 mL | SUBCUTANEOUS | Qty: 0.4

## 2015-08-24 NOTE — Unmapped (Signed)
Patient noted crying in pain one hour and half after pain medication was administered. Vitals assessed and stable. Patient refused Neurontin. Patient education on what this medication treats and how it may help decrease pain. Patient remains non complaint with medication. TPN continues to run with tolerance.

## 2015-08-24 NOTE — Unmapped (Signed)
Pain management:  Pt has received a total of 8 doses of 4 mg each dose for a total of 32 mg of dilaudid over the past 24 hours; has received 6 doses of 15 mg of 20 mg available dose in liquid form (wasting 5 mg each dose) for a total of 90 mg oxycodone in the past 24 hours.  Pt has a fentanyl patch at 50 mcg on her left arm.    Pt remains at stated pain level of 9 to 10; remains anxious, tearful and demanding pain medication at or before doses are prescribed.  Pt has stated the best pain level she has achieved is a 7 of 10.Pt declines neurontin, states it makes her sick and she doesn't like it;

## 2015-08-24 NOTE — Unmapped (Signed)
Problem: Daily Care  Goal: Daily care needs are met  Assess and monitor ability to perform self care and identify potential discharge needs.   Outcome: Not Progressing  Pt declines to participate/provide self care.

## 2015-08-24 NOTE — Unmapped (Signed)
Problem: Pain  Goal: LTG - Patient will manage pain with the appropriate technique/Intervention  Outcome: Not Progressing  Pt still c/o of pain level of 9/10 after prn meds and scheduled meds given

## 2015-08-24 NOTE — Unmapped (Signed)
Pr alert and orient, constantly c/o pain,prn and scheduled pain meds given with minimal effect.TPN continues per ordered, pt refused her blood sugar to be checked,turned and repositioned as needed.Call light within reach. Will continue POC.

## 2015-08-24 NOTE — Unmapped (Signed)
Pharmacy Nutrition Support Service    Linda Ponce is a 45 y.o. female on TPN.  Pharmacy was consulted for monitoring and adjustment.    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    Current TPN Formula (further increased on 4/21)   Based on current weight of 40.8 kg   Amino Acids: 47.7 g/L (80 grams, 1.96 grams/kg/day, 320 kcal)   Dextrose: 175 g/L (293 grams, 997 kcal)   Lipids: 21 g/L (35.2 grams, 352 kcal)   Total Daily Calories: 1669 kcal (41 kcal/kg/day)   Volume: 1675 mL   Rate: 69.8 mL/h    Pt Weights:  40.8 kg (08/17/15)    40.9 kg (08/11/15)    40.8 kg (08/03/15)    38.7 kg (07/31/15)    39.8 kg (07/27/15)    42.8 kg (07/21/15)    TPN Medication Recent History (Show up to 1 orders; newest on the left.)     Start date and time   08/23/2015 1800      TPN ADULT ([facility]) [[id number]]    Order Status  Active    Last Given  08/23/2015 1713       Macro Ingredients    amino acid 10%  47.7 g/L    dextrose 70%  175 g/L       QS Base    sterile water  267.22 mL       Lipids    fat emulsion 30 %  21 g/L       TPN Electrolytes entered as PER DAY amount    sodium chloride  80 mEq    magnesium sulfate  18 mEq    calcium gluconate  9.6 mEq    potassium chloride  20 mEq    potassium phosphate  18 mmol       TPN Additives entered as PER DAY amount    MVI adult no. 1 with vitamin K  10 mL    ascorbic acid (vitamin C)  200 mg    trace elements (MULTITRACE-5)  1 mL       Calorie Contribution    Proteins  319.6 kcal    Dextrose  997.22 kcal    Lipids  351.9 kcal    Total  1,668.72 kcal       Electrolyte Ion Calculated Amount    Sodium  80 mEq    Potassium  46.4 mEq    Calcium  9.6 mEq    Magnesium  18 mEq    Aluminum  --    Phosphate  18 mmol    Chloride  100 mEq    Acetate  --       Other    Total Protein  79.9 g    Total Protein/kg  1.96 g/kg    Glucose Infusion Rate  4.99 mg/kg/min    Osmolarity  1,550.49    Volume  1,675 mL    Rate  69.8 mL/hr    Dosing Weight  40.8 kg    Infusion Site  Central         Diet Orders          TPN ADULT ([facility]) at 69.8 mL/hr starting at 05/12 1800    Diet NPO effective now Except for: except ice chips, except sips of clears starting at 03/28 0959          TPN Associated Medications:    - GI Prophylaxis: famotidine   - Antiemetic: ondansetron PRN   - Bowel Regimen: none   -  Insulin Regimen: sliding scale regular insulin     Recent Labs:    Lab 08/22/15  0506 08/19/15  0520   SODIUM 136 135   POTASSIUM 4.3 4.5   CHLORIDE 107 100   CO2 27 30   BUN 24 30*   CREATININE 0.42* 0.54*   GLUCOSE 83 75   CALCIUM 7.5* 8.2*   PHOSPHORUS 3.5 4.3   MAGNESIUM 1.9 2.0   Corrected calcium (08/22/15): at least 9.5, corrected for albumin<1.5  Free calcium (08/12/15): 4.86, goal 4.40 - 5.40 mg/dL      Lab 16/10/96  0454 08/19/15  0520   ALK PHOS  --  202*   ALT  --  26   AST  --  23   BILIRUBIN TOTAL  --  0.3   BILIRUBIN DIRECT  --  0.11   TOTAL PROTEIN  --  5.3*   ALBUMIN <1.5* <1.5*   <1.5*         Lab 08/19/15  0520   TRIGLYCERIDES 115         Assessment and Plan:    - Macronutrients: formulation was increased on 4/17 and again on 4/21 as per dietary's recommendations. Continue same formulation. Pt has minimal PO intake.  - Electrolytes: no new labs today; continue same formulation from yesterday. Calcium was increased in TPN on 5/11. A free calcium level is ordered for Mon 5/15 considering albumin<1.5.  - Multivitamin/Trace Elements: provided daily in TPN.   - Glucose: has been WNL, no sliding scale insulin doses required since 4/14.  - Other additives: ascorbic acid 200 mg added to each bag considering GI output and wound healing.  - Check labs twice weekly: Monday/Thursday.    Thank you for the consult.    Kristie Cowman, PharmD  08/24/2015 7:40 AM

## 2015-08-24 NOTE — Unmapped (Signed)
Problem: Compromised Skin Integrity  Goal: LTG - Patient will maintain/improve skin integrity through proper skin care techniques  Outcome: Not Progressing  Pt is declining care/ refusing to be repositioned or to reposition self.

## 2015-08-24 NOTE — Unmapped (Signed)
Nursing Clinical Progress Note        Patient Active Problem List    Diagnosis Date Noted   ??? Adjustment disorder with mixed anxiety and depressed mood 08/06/2015   ??? Pain of multiple sites 08/06/2015   ??? Cognitive dysfunction-recent onset. 08/06/2015   ??? 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 Malnutrition 07/08/2015   ??? Fistula 07/08/2015   ??? Abdominal abscess 07/04/2015       Filed Vitals:    08/22/15 1115 08/22/15 1647 08/23/15 1811 08/24/15 0454   BP: 89/55 107/60 93/46 100/52   Pulse: 110 134 115 100   Temp: 98.4 ??F (36.9 ??C) 98 ??F (36.7 ??C) 97.6 ??F (36.4 ??C) 98.8 ??F (37.1 ??C)   TempSrc: Oral Axillary Oral Oral   Resp: 27 18 18 16    Height:       Weight:       SpO2: 98% 99% 100% 100%       Lines/Drains/Access - Yes   If yes, please list  rt ij cath; foley cath; bilat nephrostomy tubes; abd fistula x 2; rt colostomy    Does the patient have any changes to their condition warranting intervention? No       RN Shift Note Additional: pt is in no distress, respirations easy and unlabored, skin is warm, pink and dry.  Pt is able to make needs known. Pt is anxious, demanding and tearful; declining care and medications.       Does the patient's have any transfers/appointments or testing scheduled -   No future appointments.    Patient/Family questions - No    Mcarthur Rossetti, RN

## 2015-08-24 NOTE — Unmapped (Addendum)
Hospitalist Progress Note    08/24/2015     Early [place]   LOS: 47 days       HPI;    45 yr old female with underlying colorectal cancer with enteroanastomotic and enterocutaneous fistulas.??   Subjective & Interval History;    Anxious about pain meds spoke with nurse staff. Pt reports works but not get on time  abd back pain no fever ncd cp sob   anxious      Review of Systems:  10 point ROS was negative except as above.       PMH: Unchanged from H&P.   PSH: Unchanged from H&P.   Family medical history: Unchanged from H&P.   Social history: Unchanged from H&P      Scheduled Meds:  ??? [START ON 08/28/2015] clopidogrel  75 mg Per NG tube Daily 0900   ??? enoxaparin  40 mg Subcutaneous Daily 0900   ??? famotidine (PF)  20 mg Intravenous BID   ??? fentaNYL  1 patch Transdermal Q72H   ??? gabapentin  125 mg Oral 3 times per day   ??? insulin regular  0-5 Units Subcutaneous Daily 0900   ??? oxyCODONE  15 mg Oral 6 times per day     Continuous Infusions:  ??? TPN ADULT ([facility]) 69.8 mL/hr at 08/23/15 1713   ??? TPN ADULT ([facility])       PRN Meds:ALPRAZolam, alteplase, dextrose, dextrose 50 % in water (D50W), diclofenac sodium, heparin lock flush, HYDROmorphone, ipratropium-albuterol, LORazepam, ondansetron    Objective:    Vital signs in last 24 hours:  Temp:  [97.6 ??F (36.4 ??C)-98.8 ??F (37.1 ??C)] 98.7 ??F (37.1 ??C)  Heart Rate:  [100-120] 120  Resp:  [16-26] 26  BP: (93-105)/(46-63) 105/63 mmHg    Physical Examination:    GENERAL: awake anxious ox3  HEENT:Atraumatic.Normocephalic. PERRLA, EOMI. No icterus, no pallor, no jugular venous distention..No ear or nose discharge.  NECK: Supple. Trachea is in midline. No Thyromegaly  CHEST:ctab no wrr  CARDIOVASCULAR ns1s2 no mrg rrr  ABDOMEN: Whole anterior abdomen is covered with pouch. Brownish fistula output noted.Bilateral nephrostomy tubes.  EXTREMITIES: There is + BLE edema . Very tender to touch both legs.  Right foot has dressing in place.   Skin;No bruise,No rash.No petechiae.  CNS;  CN II-XII grossly intact.       Intake/Output last 3 shifts:  I/O last 3 completed shifts:  In: 1556 [I.V.:800]  Out: 1950 [Urine:1450; Stool:500]    Recent Labs;                           Lab name 08/15/15  0650 08/19/15  0520 08/22/15  0506   WBC 6.9 8.8 6.6   HEMOGLOBIN 8.6* 8.7* 7.9*   HEMATOCRIT 26.2* 27.4* 25.4*   MEAN CORPUSCULAR VOLUME 93.0 92.9 94.3   PLATELETS 663* 638* 418*                                                            Lab name 08/15/15  0650 08/19/15  0520 08/22/15  0506   SODIUM 138 135 136   POTASSIUM 4.5 4.5 4.3   CREATININE 0.40* 0.54* 0.42*   BUN 26* 30* 24   CHLORIDE 104 100 107   CO2 28  30 27   PHOSPHORUS 4.3 4.3 3.5   MAGNESIUM 1.8 2.0 1.9                         Lab name 07/29/15  0409 08/05/15  0351 08/12/15  0521 08/19/15  0520   AST 22 17 14 23    ALT 22 16 15 26    BILIRUBIN TOTAL 0.3 0.2 0.2 0.3       Lab name 07/05/15  0702  07/09/15  0544  08/15/15  0650 08/19/15  0520 08/22/15  0506   PREALBUMIN 14.0*  --  16.0*  --   --   --   --    ALBUMIN  --   < > 1.5*  < > <1.5* <1.5*   <1.5* <1.5*   < > = values in this interval not displayed.                                                                                                   Lab name 08/19/15  0520 08/22/15  0506   CALCIUM 8.2* 7.5*   PHOSPHORUS 4.3 3.5                              Lab name 08/12/15  0521 08/19/15  0520   TRIGLYCERIDES 115 115                        Lab name 08/05/15  0900 08/07/15  0850 08/10/15  0830   VANCOMYCIN TROUGH 9.3* 9.1* 17.4   CT abdomen 3/24  1. Multiple abscess, large pelvic abscess with surgical drain,  right issue rectal fossa.  2. 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  3. Granulation tissue enhancement or blood extravasation in the  lower abdominal wall as described  4. Proximity of the small bowel vasculature at the level of the  open wound on the right, side of active bleeding  5. Lower lobe bronchopneumonia and aspiration is  also considered    Diet;    Diet Orders          TPN ADULT ([facility]) at 69.8 mL/hr starting at 05/13 1800    TPN ADULT ([facility]) at 69.8 mL/hr starting at 05/12 1800    Diet NPO effective now Except for: except ice chips, except sips of clears starting at 03/28 1610          Future Appointments:  No future appointments.    Assessment/Plan:    Acute on Chronic pain on dilaudid fentanyl and oxycodone reviewed Dr Minerva Ends note. Was drowsy before on meds. No increase in pain meds at this time  receiving Dilaudid IV every 3 hr  Prn,Fentanyl patch decreased to 50  from 75  mcg. Started PO Oxycodone scheduled  Gabapentin  was on before was on oxycodone 15mg  before     Peritoneal abscess- Mixed flora, pigtail drain 06/26/15 . Vanco/Zosyn/Diflucan.  6 week Abx done on 08/12/15.  Enterocutaneous Fistula- wound care, bowel rest. Still has significant output.    Recurrent presacral abscesses. Off Abx.     Nausea.  Zofran.    Right nephrostomy tube dislodged and leaking. IR referral placed. Scheduled at [facility] on 5/16.    Thrombocytosis.  reactive.    Cachexia-    TPN, recheck PAB. Albumin stays less than 1.5.    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  Refused heparin, aspirin and Plavix. Refused all heparin injections previously as well. Canceled aspirin but continue to encourage Plavix.. F/u podiatrist at [facility].    History of colon cancer s/p exenteration with bilateral nephrostomy tubes                  Prophylaxis;  GI Prophylaxis: Pepcid  DVT Prophylaxis: Lovenox     Code Status: Full Code      Barb Merino MD.       08/24/2015

## 2015-08-25 LAB — POC GLU MONITORING DEVICE
POC Glucose Monitoring Device: 116 mg/dL — ABNORMAL HIGH (ref 70–100)
POC Glucose Monitoring Device: 125 mg/dL — ABNORMAL HIGH (ref 70–100)

## 2015-08-25 MED ORDER — TPN ADULT (WCH/Drake)
10 | INTRAVENOUS | Status: AC
Start: 2015-08-25 — End: 2015-08-26
  Administered 2015-08-25: 21:00:00 via INTRAVENOUS

## 2015-08-25 MED FILL — GABAPENTIN 250 MG/5 ML ORAL SOLUTION: 50 50 mg/mL | ORAL | Qty: 5

## 2015-08-25 MED FILL — HYDROMORPHONE 2 MG/ML INJECTION SYRINGE: 2 2 mg/mL | INTRAMUSCULAR | Qty: 2

## 2015-08-25 MED FILL — FAMOTIDINE (PF) 20 MG/2 ML INTRAVENOUS SOLUTION: 20 20 mg/2 mL | INTRAVENOUS | Qty: 2

## 2015-08-25 MED FILL — OXYCODONE 20 MG/ML ORAL CONCENTRATE: 20 20 mg/mL | ORAL | Qty: 1

## 2015-08-25 MED FILL — TRAVASOL 10 % INTRAVENOUS SOLUTION: 10 10 % | INTRAVENOUS | Qty: 799

## 2015-08-25 MED FILL — ONDANSETRON HCL (PF) 4 MG/2 ML INJECTION SOLUTION: 4 4 mg/2 mL | INTRAMUSCULAR | Qty: 2

## 2015-08-25 NOTE — Unmapped (Signed)
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   Subjective & Interval History;    Anxious about pain meds spoke with nurse staff. Pt reports works but not get on time  abd back pain no fever ncd cp sob   anxious PT STATES SHE DOESN'T NEED MORE PAIN MEDS      Review of Systems:  10 point ROS was negative except as above.       PMH: Unchanged from H&P.   PSH: Unchanged from H&P.   Family medical history: Unchanged from H&P.   Social history: Unchanged from H&P      Scheduled Meds:  ??? [START ON 08/28/2015] clopidogrel  75 mg Per NG tube Daily 0900   ??? enoxaparin  40 mg Subcutaneous Daily 0900   ??? famotidine (PF)  20 mg Intravenous BID   ??? fentaNYL  1 patch Transdermal Q72H   ??? gabapentin  125 mg Oral 3 times per day   ??? insulin regular  0-5 Units Subcutaneous Daily 0900   ??? oxyCODONE  15 mg Oral 6 times per day     Continuous Infusions:  ??? TPN ADULT ([facility]) 69.8 mL/hr at 08/24/15 1712     PRN Meds:ALPRAZolam, alteplase, dextrose, dextrose 50 % in water (D50W), diclofenac sodium, heparin lock flush, HYDROmorphone, ipratropium-albuterol, LORazepam, ondansetron    Objective:    Vital signs in last 24 hours:  Temp:  [98.3 ??F (36.8 ??C)-100 ??F (37.8 ??C)] 100 ??F (37.8 ??C)  Heart Rate:  [105-130] 122  Resp:  [20-26] 20  BP: (92-114)/(45-71) 92/45 mmHg    Physical Examination:    GENERAL: awake anxious ox3  HEENT:Atraumatic.Normocephalic. PERRLA, EOMI. No icterus, no pallor, no jugular venous distention..No ear or nose discharge.  NECK: Supple. Trachea is in midline. No Thyromegaly  CHEST:ctab no wrr  CARDIOVASCULAR ns1s2 no mrg rrr  ABDOMEN: Whole anterior abdomen is covered with pouch. Brownish fistula output noted.Bilateral nephrostomy tubes.  EXTREMITIES: There is + BLE edema . Very tender to touch both legs.  Right foot has dressing in place.   Skin;No bruise,No rash.No  petechiae.  CNS; CN II-XII grossly intact.       Intake/Output last 3 shifts:  I/O last 3 completed shifts:  In: 1605   Out: 950 [Urine:950]    Recent Labs;                           Lab name 08/15/15  0650 08/19/15  0520 08/22/15  0506   WBC 6.9 8.8 6.6   HEMOGLOBIN 8.6* 8.7* 7.9*   HEMATOCRIT 26.2* 27.4* 25.4*   MEAN CORPUSCULAR VOLUME 93.0 92.9 94.3   PLATELETS 663* 638* 418*                                                            Lab name 08/15/15  0650 08/19/15  0520 08/22/15  0506   SODIUM 138 135 136   POTASSIUM 4.5 4.5 4.3   CREATININE 0.40* 0.54* 0.42*   BUN 26* 30* 24   CHLORIDE 104 100 107   CO2 28 30  27   PHOSPHORUS 4.3 4.3 3.5   MAGNESIUM 1.8 2.0 1.9                         Lab name 07/29/15  0409 08/05/15  0351 08/12/15  0521 08/19/15  0520   AST 22 17 14 23    ALT 22 16 15 26    BILIRUBIN TOTAL 0.3 0.2 0.2 0.3       Lab name 07/05/15  0702  07/09/15  0544  08/15/15  0650 08/19/15  0520 08/22/15  0506   PREALBUMIN 14.0*  --  16.0*  --   --   --   --    ALBUMIN  --   < > 1.5*  < > <1.5* <1.5*   <1.5* <1.5*   < > = values in this interval not displayed.                                                                                                   Lab name 08/19/15  0520 08/22/15  0506   CALCIUM 8.2* 7.5*   PHOSPHORUS 4.3 3.5                              Lab name 08/12/15  0521 08/19/15  0520   TRIGLYCERIDES 115 115                        Lab name 08/05/15  0900 08/07/15  0850 08/10/15  0830   VANCOMYCIN TROUGH 9.3* 9.1* 17.4   CT abdomen 3/24  1. Multiple abscess, large pelvic abscess with surgical drain,  right issue rectal fossa.  2. 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  3. Granulation tissue enhancement or blood extravasation in the  lower abdominal wall as described  4. Proximity of the small bowel vasculature at the level of the  open wound on the right, side of active bleeding  5. Lower lobe bronchopneumonia and aspiration is also  considered    Diet;    Diet Orders          TPN ADULT ([facility]) at 69.8 mL/hr starting at 05/13 1800    Diet NPO effective now Except for: except ice chips, except sips of clears starting at 03/28 1610          Future Appointments:  No future appointments.    Assessment/Plan:    Acute on Chronic pain on dilaudid fentanyl and oxycodone reviewed Dr Minerva Ends note. Was drowsy before on meds. No increase in pain meds at this time REASSURED PT  receiving Dilaudid IV every 3 hr  Prn,Fentanyl patch decreased to 50  from 75  mcg. Started PO Oxycodone scheduled  Gabapentin  was on before was on oxycodone 15mg  before     Peritoneal abscess- Mixed flora, pigtail drain 06/26/15 . Vanco/Zosyn/Diflucan.  6 week Abx done on 08/12/15.      Enterocutaneous Fistula- wound care, bowel rest. Still has significant output.  Recurrent presacral abscesses. Off Abx.NO HIGH GRADE FEVERS     Nausea.  Zofran. NO EMESIS    Right nephrostomy tube dislodged and leaking. IR referral placed. Scheduled at [facility] on 5/16.    Thrombocytosis.  reactive.    Cachexia-    TPN, recheck PAB. Albumin stays less than 1.5.    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  Refused heparin, aspirin and Plavix. Refused all heparin injections previously as well. Canceled aspirin but continue to encourage Plavix.. F/[facility] podiatrist at [facility].    History of colon cancer s/p exenteration with bilateral nephrostomy tubes, C/W DILAUDID                  Prophylaxis;  GI Prophylaxis: Pepcid  DVT Prophylaxis: Lovenox     Code Status: Full Code      Barb Merino MD.       08/25/2015

## 2015-08-25 NOTE — Unmapped (Signed)
Problem: Daily Care  Goal: Daily care needs are met  Assess and monitor ability to perform self care and identify potential discharge needs.   Outcome: Not Progressing  Pt refuses care; remains anxious.  Needs encouragement to participate/provide self care

## 2015-08-25 NOTE — Unmapped (Deleted)
Hospitalist Progress Note    08/25/2015     Linda Ponce   LOS: 48 days       HPI;    45 yr old female with underlying colorectal cancer with enteroanastomotic and enterocutaneous fistulas.??   Subjective & Interval History;    Anxious about pain meds spoke with nurse staff. Pt reports works but not get on time  abd back pain no fever ncd cp sob   anxious PT STATES SHE DOESN'T NEED MORE PAIN MEDS       Review of Systems:  10 point ROS was negative except as above.       PMH: Unchanged from H&P.   PSH: Unchanged from H&P.   Family medical history: Unchanged from H&P.   Social history: Unchanged from H&P      Scheduled Meds:  ??? [START ON 08/28/2015] clopidogrel  75 mg Per NG tube Daily 0900   ??? enoxaparin  40 mg Subcutaneous Daily 0900   ??? famotidine (PF)  20 mg Intravenous BID   ??? fentaNYL  1 patch Transdermal Q72H   ??? gabapentin  125 mg Oral 3 times per day   ??? insulin regular  0-5 Units Subcutaneous Daily 0900   ??? oxyCODONE  15 mg Oral 6 times per day     Continuous Infusions:  ??? TPN ADULT ([facility]) 69.8 mL/hr at 08/24/15 1712   ??? TPN ADULT ([facility])       PRN Meds:ALPRAZolam, alteplase, dextrose, dextrose 50 % in water (D50W), diclofenac sodium, heparin lock flush, HYDROmorphone, ipratropium-albuterol, LORazepam, ondansetron    Objective:    Vital signs in last 24 hours:  Temp:  [98.3 ??F (36.8 ??C)-100 ??F (37.8 ??C)] 99.1 ??F (37.3 ??C)  Heart Rate:  [105-130] 120  Resp:  [20-23] 20  BP: (92-114)/(45-71) 92/46 mmHg    Physical Examination:    GENERAL: awake anxious ox3  HEENT:Atraumatic.Normocephalic. PERRLA, EOMI. No icterus, no pallor, no jugular venous distention..No ear or nose discharge.  NECK: Supple. Trachea is in midline. No Thyromegaly  CHEST:ctab no wrr  CARDIOVASCULAR ns1s2 no mrg rrr  ABDOMEN: Whole anterior abdomen is covered with pouch. Brownish fistula output noted.Bilateral nephrostomy tubes.  EXTREMITIES: There is + BLE edema . Very tender to touch both legs.  Right foot has dressing in place.    Skin;No bruise,No rash.No petechiae.  CNS; CN II-XII grossly intact.       Intake/Output last 3 shifts:  I/O last 3 completed shifts:  In: 1605   Out: 950 [Urine:950]    Recent Labs;                           Lab name 08/15/15  0650 08/19/15  0520 08/22/15  0506   WBC 6.9 8.8 6.6   HEMOGLOBIN 8.6* 8.7* 7.9*   HEMATOCRIT 26.2* 27.4* 25.4*   MEAN CORPUSCULAR VOLUME 93.0 92.9 94.3   PLATELETS 663* 638* 418*                                                            Lab name 08/15/15  0650 08/19/15  0520 08/22/15  0506   SODIUM 138 135 136   POTASSIUM 4.5 4.5 4.3   CREATININE 0.40* 0.54* 0.42*   BUN 26* 30* 24  CHLORIDE 104 100 107   CO2 28 30 27    PHOSPHORUS 4.3 4.3 3.5   MAGNESIUM 1.8 2.0 1.9                         Lab name 07/29/15  0409 08/05/15  0351 08/12/15  0521 08/19/15  0520   AST 22 17 14 23    ALT 22 16 15 26    BILIRUBIN TOTAL 0.3 0.2 0.2 0.3       Lab name 07/05/15  0702  07/09/15  0544  08/15/15  0650 08/19/15  0520 08/22/15  0506   PREALBUMIN 14.0*  --  16.0*  --   --   --   --    ALBUMIN  --   < > 1.5*  < > <1.5* <1.5*   <1.5* <1.5*   < > = values in this interval not displayed.                                                                                                   Lab name 08/19/15  0520 08/22/15  0506   CALCIUM 8.2* 7.5*   PHOSPHORUS 4.3 3.5                              Lab name 08/12/15  0521 08/19/15  0520   TRIGLYCERIDES 115 115                        Lab name 08/05/15  0900 08/07/15  0850 08/10/15  0830   VANCOMYCIN TROUGH 9.3* 9.1* 17.4   CT abdomen 3/24  1. Multiple abscess, large pelvic abscess with surgical drain,  right issue rectal fossa.  2. 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  3. Granulation tissue enhancement or blood extravasation in the  lower abdominal wall as described  4. Proximity of the small bowel vasculature at the level of the  open wound on the right, side of active bleeding  5. Lower lobe  bronchopneumonia and aspiration is also considered    Diet;    Diet Orders          TPN ADULT ([facility]) at 69.8 mL/hr starting at 05/14 1800    TPN ADULT ([facility]) at 69.8 mL/hr starting at 05/13 1800    Diet NPO effective now Except for: except ice chips, except sips of clears starting at 03/28 1610          Future Appointments:  No future appointments.    Assessment/Plan:    Acute on Chronic pain on dilaudid fentanyl and oxycodone reviewed Dr Minerva Ends note. Was drowsy before on meds. No increase in pain meds at this time REASSURED PT  receiving Dilaudid IV every 3 hr  Prn,Fentanyl patch decreased to 50  from 75  mcg. Started PO Oxycodone scheduled  Gabapentin  was on before was on oxycodone 15mg  before     Peritoneal abscess- Mixed flora, pigtail drain 06/26/15 . Vanco/Zosyn/Diflucan.  6 week Abx done on 08/12/15.      Enterocutaneous Fistula- wound care, bowel rest. Still has significant output.    Recurrent presacral abscesses. Off Abx.NO HIGH GRADE FEVERS     Nausea.  Zofran. NO EMESIS    Right nephrostomy tube dislodged and leaking. IR referral placed. Scheduled at [facility] on 5/16.    Thrombocytosis.  reactive.    Cachexia-    TPN, recheck PAB. Albumin stays less than 1.5.    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  Refused heparin, aspirin and Plavix. Refused all heparin injections previously as well. Canceled aspirin but continue to encourage Plavix.. F/u podiatrist at [facility].    History of colon cancer s/p exenteration with bilateral nephrostomy tubes, C/W DILAUDID                  Prophylaxis;  GI Prophylaxis: Pepcid  DVT Prophylaxis: Lovenox     Code Status: Full Code      Barb Merino MD.       08/25/2015

## 2015-08-25 NOTE — Unmapped (Signed)
Clinical Pharmacy Service - TPN Progress Note    Gita Traber is followed by the pharmacy department for monitoring and adjustment of TPN    Assessment/Plan:   ?? No new labs, continue TPN with electrolytes, macronutrients and additives from yesterday, check renal panel twice weekly    Subjective/Objective:    No results for input(s): NA, CL, CO2, BUN, CREATININE, K, PHOS, MG, CALCIUM, FCAS, ALBUMIN, PREALBUMIN, GLUCOSE, ALKPHOS, AST, ALT, BILITOT, BILIDIRECT, TRIG in the last 72 hours.  Recent Labs      08/22/15   1631  08/23/15   1620  08/24/15   1114  08/24/15   2120  08/25/15   0558   POCGMD  123*  105*  118*  116*  125*      Insulin Orders       Dose Frequency Start End    insulin regular (HumuLIN R) injection Soln 0-5 Units 0-5 Units Daily 07/26/2015     Route: Subcutaneous           Diet Orders          TPN ADULT ([facility]) at 69.8 mL/hr starting at 05/13 1800    Diet NPO effective now Except for: except ice chips, except sips of clears starting at 03/28 0959        TPN Medication Recent History (Show up to 1 orders; newest on the left.)     Start date and time   08/24/2015 1800      TPN ADULT ([facility]) [[id number]]    Order Status  Active    Last Given  08/24/2015 1712       Macro Ingredients    amino acid 10%  47.7 g/L    dextrose 70%  175 g/L       QS Base    sterile water  267.22 mL       Lipids    fat emulsion 30 %  21 g/L       TPN Electrolytes entered as PER DAY amount    sodium chloride  80 mEq    magnesium sulfate  18 mEq    calcium gluconate  9.6 mEq    potassium chloride  20 mEq    potassium phosphate  18 mmol       TPN Additives entered as PER DAY amount    MVI adult no. 1 with vitamin K  10 mL    ascorbic acid (vitamin C)  200 mg    trace elements (MULTITRACE-5)  1 mL       Calorie Contribution    Proteins  319.6 kcal    Dextrose  997.22 kcal    Lipids  351.9 kcal    Total  1,668.72 kcal       Electrolyte Ion Calculated Amount    Sodium  80 mEq    Potassium  46.4 mEq    Calcium  9.6 mEq     Magnesium  18 mEq    Aluminum  --    Phosphate  18 mmol    Chloride  100 mEq    Acetate  --       Other    Total Protein  79.9 g    Total Protein/kg  1.96 g/kg    Glucose Infusion Rate  4.99 mg/kg/min    Osmolarity  1,550.49    Volume  1,675 mL    Rate  69.8 mL/hr    Dosing Weight  40.8 kg    Infusion Site  [facility]  Thank you for the consult,  Andree Elk, [facility]  08/25/2015 9:32 AM

## 2015-08-25 NOTE — Unmapped (Addendum)
Nursing Clinical Progress Note        Patient Active Problem List    Diagnosis Date Noted   ??? Adjustment disorder with mixed anxiety and depressed mood 08/06/2015   ??? Pain of multiple sites 08/06/2015   ??? Cognitive dysfunction-recent onset. 08/06/2015   ??? 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 Malnutrition 07/08/2015   ??? Fistula 07/08/2015   ??? Abdominal abscess 07/04/2015       Filed Vitals:    08/24/15 1114 08/24/15 1714 08/25/15 0042 08/25/15 0628   BP: 105/63 114/71 95/48 92/45    Pulse: 120 105 130 122   Temp: 98.7 ??F (37.1 ??C) 98.3 ??F (36.8 ??C) 99.6 ??F (37.6 ??C) 100 ??F (37.8 ??C)   TempSrc: Oral Oral Oral Oral   Resp: 26 23 20 20    Height:       Weight:       SpO2: 99% 99% 96% 96%       Lines/Drains/Access - Yes   If yes, please list  rt ij catheter; foley catheter, bilateral neph tubes; rt colostomy    Does the patient have any changes to their condition warranting intervention? No  RN Shift Note Additional: pt is in no distress, respirations easy and unlabored w/skin warm, pink and dry.  Pt is able to make needs known.     Does the patient's have any transfers/appointments or testing scheduled -   No future appointments.    Patient/Family questions - No    Mcarthur Rossetti, RN    Pt declines physical examination d/t concerns w/pain management. Pt is anxious, fretful and only wants towels wrapped around her abdomen rather than having dressings examined or changed.

## 2015-08-26 LAB — HEPATIC FUNCTION PANEL
ALT: 17 U/L (ref 7–52)
AST: 23 U/L (ref 13–39)
Albumin: <1.5 g/dL (ref 3.5–5.7)
Alkaline Phosphatase: 200 U/L (ref 36–125)
Bilirubin, Direct: 0.2 mg/dL (ref 0.00–0.40)
Bilirubin, Indirect: 0.1 mg/dL (ref 0.00–1.10)
Total Bilirubin: 0.3 mg/dL (ref 0.0–1.5)
Total Protein: 5 g/dL (ref 6.4–8.9)

## 2015-08-26 LAB — RENAL FUNCTION PANEL W/EGFR
Albumin: <1.5 g/dL (ref 3.5–5.7)
Anion Gap: 8 mmol/L (ref 3–16)
BUN: 26 mg/dL (ref 7–25)
CO2: 27 mmol/L (ref 21–33)
Calcium: 8 mg/dL (ref 8.6–10.3)
Chloride: 103 mmol/L (ref 98–110)
Creatinine: 0.5 mg/dL (ref 0.60–1.30)
Glucose: 95 mg/dL (ref 70–100)
Osmolality, Calculated: 291 mOsm/kg (ref 278–305)
Phosphorus: 3.8 mg/dL (ref 2.1–4.7)
Potassium: 4.5 mmol/L (ref 3.5–5.3)
Sodium: 138 mmol/L (ref 133–146)
eGFR AA CKD-EPI: >90 See note.
eGFR NONAA CKD-EPI: >90 See note.

## 2015-08-26 LAB — CBC
Hematocrit: 26.3 % (ref 35.0–45.0)
Hemoglobin: 8.3 g/dL (ref 11.7–15.5)
MCH: 28.8 pg (ref 27.0–33.0)
MCHC: 31.5 g/dL (ref 32.0–36.0)
MCV: 91.5 fL (ref 80.0–100.0)
MPV: 6.7 fL (ref 7.5–11.5)
Platelets: 533 10*3/uL (ref 140–400)
RBC: 2.87 10*6/uL (ref 3.80–5.10)
RDW: 16.4 % (ref 11.0–15.0)
WBC: 8.8 10*3/uL (ref 3.8–10.8)

## 2015-08-26 LAB — CALCIUM FREE, SERUM: Free Calcium, Ser: 4.9 mg/dL (ref 4.40–5.40)

## 2015-08-26 LAB — POC GLU MONITORING DEVICE: POC Glucose Monitoring Device: 142 mg/dL (ref 70–100)

## 2015-08-26 LAB — TRIGLYCERIDES: Triglycerides: 96 mg/dL (ref 10–149)

## 2015-08-26 LAB — MAGNESIUM: Magnesium: 1.7 mg/dL (ref 1.5–2.5)

## 2015-08-26 MED ORDER — TPN ADULT (WCH/Drake)
2 | INTRAVENOUS | Status: AC
Start: 2015-08-26 — End: 2015-08-27
  Administered 2015-08-26: 22:00:00 via INTRAVENOUS

## 2015-08-26 MED FILL — FENTANYL 50 MCG/HR TRANSDERMAL PATCH: 50 50 mcg/hr | TRANSDERMAL | Qty: 1

## 2015-08-26 MED FILL — FAMOTIDINE (PF) 20 MG/2 ML INTRAVENOUS SOLUTION: 20 20 mg/2 mL | INTRAVENOUS | Qty: 2

## 2015-08-26 MED FILL — ONDANSETRON HCL (PF) 4 MG/2 ML INJECTION SOLUTION: 4 4 mg/2 mL | INTRAMUSCULAR | Qty: 2

## 2015-08-26 MED FILL — GABAPENTIN 250 MG/5 ML ORAL SOLUTION: 50 50 mg/mL | ORAL | Qty: 5

## 2015-08-26 MED FILL — OXYCODONE 20 MG/ML ORAL CONCENTRATE: 20 20 mg/mL | ORAL | Qty: 1

## 2015-08-26 MED FILL — HYDROMORPHONE 2 MG/ML INJECTION SYRINGE: 2 2 mg/mL | INTRAMUSCULAR | Qty: 2

## 2015-08-26 MED FILL — TRAVASOL 10 % INTRAVENOUS SOLUTION: 10 10 % | INTRAVENOUS | Qty: 799

## 2015-08-26 NOTE — Unmapped (Signed)
Problem: Potential for Compromised Skin Integrity  Goal: Skin integrity is maintained or improved  Assess and monitor skin integrity. Identify patients at risk for skin breakdown on admission and per policy. Collaborate with interdisciplinary team and initiate plans and interventions as needed.   Patient's skin integrity is maintained at this time through encouragement of turning/repositioning, pressure reduction techniques, and through use of basic skin care techniques keeping skin clean and dry.  Intervention: Turn patient  Pt encouraged to turn and reposition at least every 2 hours, reinforcement needed due to pt being noncompliance with repositioning interventions.

## 2015-08-26 NOTE — Unmapped (Signed)
Pharmacy Nutrition Support Service    Linda Ponce is a 45 y.o. female on TPN.  Pharmacy was consulted for monitoring and adjustment.    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    Current TPN Formula (further increased on 4/21)   Based on current weight of 40.8 kg   Amino Acids: 47.7 g/L (80 grams, 1.96 grams/kg/day, 320 kcal)   Dextrose: 175 g/L (293 grams, 997 kcal)   Lipids: 21 g/L (35.2 grams, 352 kcal)   Total Daily Calories: 1669 kcal (41 kcal/kg/day)   Volume: 1675 mL   Rate: 69.8 mL/h    Pt Weights:  40.8 kg (08/17/15)    40.9 kg (08/11/15)    40.8 kg (08/03/15)    38.7 kg (07/31/15)    39.8 kg (07/27/15)    42.8 kg (07/21/15)    TPN Medication Recent History (Show up to 1 orders; newest on the left.)     Start date and time   08/25/2015 1800      TPN ADULT ([facility]) [[id number]]    Order Status  Active    Last Given  08/25/2015 1705       Macro Ingredients    amino acid 10%  47.7 g/L    dextrose 70%  175 g/L       QS Base    sterile water  267.22 mL       Lipids    fat emulsion 30 %  21 g/L       TPN Electrolytes entered as PER DAY amount    sodium chloride  80 mEq    magnesium sulfate  18 mEq    calcium gluconate  9.6 mEq    potassium chloride  20 mEq    potassium phosphate  18 mmol       TPN Additives entered as PER DAY amount    MVI adult no. 1 with vitamin K  10 mL    ascorbic acid (vitamin C)  200 mg    trace elements (MULTITRACE-5)  1 mL       Calorie Contribution    Proteins  319.6 kcal    Dextrose  997.22 kcal    Lipids  351.9 kcal    Total  1,668.72 kcal       Electrolyte Ion Calculated Amount    Sodium  80 mEq    Potassium  46.4 mEq    Calcium  9.6 mEq    Magnesium  18 mEq    Aluminum  --    Phosphate  18 mmol    Chloride  100 mEq    Acetate  --       Other    Total Protein  79.9 g    Total Protein/kg  1.96 g/kg    Glucose Infusion Rate  4.99 mg/kg/min    Osmolarity  1,550.49    Volume  1,675 mL    Rate  69.8 mL/hr    Dosing Weight  40.8 kg    Infusion Site  [facility]         Diet Orders          TPN ADULT ([facility]) at 69.8 mL/hr starting at 05/14 1800    Diet NPO effective now Except for: except ice chips, except sips of clears starting at 03/28 0959          TPN Associated Medications:    - GI Prophylaxis: famotidine   - Antiemetic: ondansetron PRN   - Bowel Regimen: none   -  Insulin Regimen: sliding scale regular insulin     Recent Labs:    Lab 08/26/15  0521 08/22/15  0506   SODIUM 138 136   POTASSIUM 4.5 4.3   CHLORIDE 103 107   CO2 27 27   BUN 26* 24   CREATININE 0.50* 0.42*   GLUCOSE 95 83   CALCIUM 8.0* 7.5*   PHOSPHORUS 3.8 3.5   MAGNESIUM 1.7 1.9   Free calcium (08/26/15): 4.9, goal 4.40 - 5.40 mg/dL      Lab 09/60/45  4098 08/22/15  0506   ALK PHOS 200*  --    ALT 17  --    AST 23  --    BILIRUBIN TOTAL 0.3  --    BILIRUBIN DIRECT 0.20  --    TOTAL PROTEIN 5.0*  --    ALBUMIN <1.5*   <1.5* <1.5*         Lab 08/26/15  0521   TRIGLYCERIDES 96         Assessment and Plan:    - Macronutrients: formulation was increased on 4/17 and again on 4/21 as per dietary's recommendations. Continue same formulation.  - Electrolytes: increase magnesium in TPN today.  - Multivitamin/Trace Elements: provided daily in TPN.   - Glucose: has been WNL, no sliding scale insulin doses required since 4/14.  - Other additives: ascorbic acid 200 mg added to each bag considering GI output and wound healing.  - Check labs twice weekly: Monday/Thursday.    Thank you for the consult.    Kristie Cowman, PharmD  08/26/2015 9:32 AM

## 2015-08-26 NOTE — Unmapped (Signed)
Pt alert and oriented, abdominal dressing clean and dry, complaining of constant pain, Dilaudid given as ordered PRN. Pt refused scheduled  Oxycodone. Call light within reach.

## 2015-08-26 NOTE — Unmapped (Signed)
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 Presented to [facility] with abdominal distention and fevers.?? Identified as having pelvic and intraabdominal abscess - drained by IR?? - mixed enteric flora with no predominance.?? Readmitted to [facility] with bleeding for wound.?? Course complicated by ischemia to right foot..??     Subjective & Interval History;    Tearful.  Not looking in acute cardiorespiratory distress.  Pain not under control.   Became upset when told her the plan to decrease the dose of IV Dilaudid. Pt states please dont do it at this time.      Review of Systems:  10 point ROS was negative except as above.       PMH: Unchanged from H&P.   PSH: Unchanged from H&P.   Family medical history: Unchanged from H&P.   Social history: Unchanged from H&P      Scheduled Meds:  ??? [START ON 08/28/2015] clopidogrel  75 mg Per NG tube Daily 0900   ??? enoxaparin  40 mg Subcutaneous Daily 0900   ??? famotidine (PF)  20 mg Intravenous BID   ??? fentaNYL  1 patch Transdermal Q72H   ??? gabapentin  125 mg Oral 3 times per day   ??? insulin regular  0-5 Units Subcutaneous Daily 0900   ??? oxyCODONE  15 mg Oral 6 times per day     Continuous Infusions:  ??? TPN ADULT ([facility]) 69.8 mL/hr at 08/25/15 1705   ??? TPN ADULT ([facility])       PRN Meds:ALPRAZolam, alteplase, dextrose, dextrose 50 % in water (D50W), diclofenac sodium, heparin lock flush, HYDROmorphone, ipratropium-albuterol, LORazepam, ondansetron    Objective:    Vital signs in last 24 hours:  Temp:  [97.9 ??F (36.6 ??C)] 97.9 ??F (36.6 ??C)  Heart Rate:  [112] 112  Resp:  [16] 16  BP: (94)/(44) 94/44 mmHg    Physical Examination:    GENERAL: The patient is awake,not in acute cardiorespiratory distress. Looking chronically sick. Tearful.   HEENT:Atraumatic.Normocephalic. PERRLA, EOMI. No icterus, no pallor, no jugular venous distention..No ear  or nose discharge.  NECK: Supple. Trachea is in midline. No Thyromegaly  CHEST: Symmetric. Clear to auscultation, bilateral equal air entry. No wheezing or rhonchi.  CARDIOVASCULAR: S1, S2, Regular pulse. No murmur or gallops. No JVD.  ABDOMEN: Whole anterior abdomen is covered with pouch. Brownish fistula output noted.Bilateral nephrostomy tubes.  EXTREMITIES: There is + BLE edema . Very tender to touch both legs.  Right foot has dressing in place.   Skin;No bruise,No rash.No petechiae.  CNS; CN II-XII grossly intact. No focal neurological deficit.       Intake/Output last 3 shifts:  I/O last 3 completed shifts:  In: 1702   Out: 850 [Urine:850]    Recent Labs;                           Lab name 08/19/15  0520 08/22/15  0506 08/26/15  0521   WBC 8.8 6.6 8.8   HEMOGLOBIN 8.7* 7.9* 8.3*   HEMATOCRIT 27.4* 25.4* 26.3*   MEAN CORPUSCULAR VOLUME 92.9 94.3 91.5   PLATELETS 638* 418* 533*  Lab name 08/19/15  0520 08/22/15  0506 08/26/15  0521   SODIUM 135 136 138   POTASSIUM 4.5 4.3 4.5   CREATININE 0.54* 0.42* 0.50*   BUN 30* 24 26*   CHLORIDE 100 107 103   CO2 30 27 27    PHOSPHORUS 4.3 3.5 3.8   MAGNESIUM 2.0 1.9 1.7                         Lab name 08/05/15  0351 08/12/15  0521 08/19/15  0520 08/26/15  0521   AST 17 14 23 23    ALT 16 15 26 17    BILIRUBIN TOTAL 0.2 0.2 0.3 0.3       Lab name 07/05/15  0702  07/09/15  0544  08/19/15  0520 08/22/15  0506 08/26/15  0521   PREALBUMIN 14.0*  --  16.0*  --   --   --   --    ALBUMIN  --   < > 1.5*  < > <1.5*   <1.5* <1.5* <1.5*   <1.5*   < > = values in this interval not displayed.                                                                                                   Lab name 08/22/15  0506 08/26/15  0521   CALCIUM 7.5* 8.0*   PHOSPHORUS 3.5 3.8                              Lab name 08/19/15  0520 08/26/15  0521   TRIGLYCERIDES 115 96                        Lab name 08/05/15  0900 08/07/15  0850 08/10/15  0830    VANCOMYCIN TROUGH 9.3* 9.1* 17.4   CT abdomen 3/24  1. Multiple abscess, large pelvic abscess with surgical drain,  right issue rectal fossa.  2. 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  3. Granulation tissue enhancement or blood extravasation in the  lower abdominal wall as described  4. Proximity of the small bowel vasculature at the level of the  open wound on the right, side of active bleeding  5. Lower lobe bronchopneumonia and aspiration is also considered    Diet;    Diet Orders          TPN ADULT ([facility]) at 69.8 mL/hr starting at 05/15 1800    TPN ADULT ([facility]) at 69.8 mL/hr starting at 05/14 1800    Diet NPO effective now Except for: except ice chips, except sips of clears starting at 03/28 0959            Assessment/Plan:      1. Peritoneal abscess- Mixed flora, pigtail drain 06/26/15 . Vanco/Zosyn/Diflucan.           -Completed  6 week Abx on 08/12/15.      2. Enterocutaneous Fistula- wound care, bowel rest. Still  has significant output.    3. Severe intractable acute on chronic pain syndrome- receiving Dilaudid IV every 3 hr  Prn,Fentanyl patch decreased to 50  from 75  mcg. Started PO Oxycodone scheduled as she had better response with it previously. Added Gabapentin as she was on previously.         Consulted Psychiatry for depression, chronic pain and recommended SNRIs to help with pain and depression.She want to try therapy first.     4. Thrombocytosis. uncertain etiology- follow closely. Likely reactive.    5. Cachexia-   continue TPN, recheck PAB. Albumin stays less than 1.5.    6. Sinus tachycardia. Likely related to pain. Monitor.    7. Ischemic feet/arterial thromboemboli- The patient had a clot in her femoral artery right leg that was cleared by vascular surgery.  However the patient ended up with necrosis at her distal digits which may be a combination of lack of blood flow and the use of pressors. Refusing heparin, aspirin and  Plavix. Refused all heparin injections previously as well. Canceled aspirin but continue to encourage Plavix..           Following podiatrist at [facility].    8. History of colon cancer s/p exenteration with bilateral nephrostomy tubes    9. Recurrent presacral abscesses. Completed the course of Abx.    10. Debility- PT/OT    11. Guarded prognosis.     12. On TPN. 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 Cant tolerate PO. Weekly LFT while on TPN. TPN being managed by pharmacy. Electrolytes replacement through TPN. LFT are WNL so far.Mg being replaced through TPN.    13. D/C planning . Family meeting held on  5/3.  Pt need extensive wound care, TPN, IV Pain meds which are hurdle to discharge.    14. Nausea. Increased frequency of Zofran.    15. Right nephrostomy tube dislodged and leaking. IR referral placed. Scheduled at [facility] on 5/16.    Continue pain control.  Continue TPN.  F/u with surgeon and IR as scheduled.  Pt not making good progress. SNF /rehab would be good place for her but IV Dilaudid is obstruction.      Prophylaxis;  GI Prophylaxis: Pepcid  DVT Prophylaxis: Lovenox     Code Status: Full Code      Maebry Obrien MD.       08/26/2015

## 2015-08-26 NOTE — Unmapped (Addendum)
Patient alert and oriented x4 upon assessment this evening.  Speech is clear, and patient appears calm.  No c/o cough or sore throat, and lung sounds are clear. Vitals are stable.  Patient has c/o pain or discomfort with prn pain medication administration which was effective, and no s/s of distress are evident at this time.  Bed mobility and ADL care are performed with staff assistance and without complication. Patient takes medications well. Pt refused 15 mg of Oxycodone at scheduled time and wanted Dilaudid instead. After education about how Oxycodone lasting longer then Dilaudid it being scheduled, and her being able to get Dilaudid in 1 hour she refused the scheduled medication and wanted to have the Dilaudid, stating  No I don't want the oxy it takes too long to work I need relief right now my back hurts I'll get it on the next dose. When the next dose rolled around she refused again until 4 am. She was not able to get Oxy in 1 hour d/t it being past the scheduled time more than 30 mins and that was explained to her also and she said she was fine with that she wanted her Dilaudid.   Over bed table and call light is within reach.  Side rails are up x2.  Patient with non skid footwear on.  TPN infusing with out any complications, will continue to monitor.

## 2015-08-26 NOTE — Unmapped (Signed)
Pt resting quietly in bed, no s/s noted of distress or discomfort. Pt administered PRN pain medication 3x during shift, medication noted to be effective. Wound care completed per orders to neph tube sites and mid abd fistula. Pt tolerated without issues. No adverse events noted throughout shift. Pt continues to receive ordered TPN at ordered rate. Bed in lowest position, call light within reach, will continue to monitor.

## 2015-08-26 NOTE — Unmapped (Signed)
Problem: Potential for Compromised Skin Integrity  Goal: Skin integrity is maintained or improved  Assess and monitor skin integrity. Identify patients at risk for skin breakdown on admission and per policy. Collaborate with interdisciplinary team and initiate plans and interventions as needed.   Outcome: Progressing  Relieve pressure to bony prominences,turn pt, keep skin clean and dry, encourage use of moisturizer on skin and avoid shearing.

## 2015-08-27 ENCOUNTER — Encounter: Attending: Diagnostic Radiology | Primary: Internal Medicine

## 2015-08-27 ENCOUNTER — Ambulatory Visit: Attending: Surgery | Primary: Internal Medicine

## 2015-08-27 ENCOUNTER — Inpatient Hospital Stay: Attending: Surgery | Primary: Internal Medicine

## 2015-08-27 ENCOUNTER — Inpatient Hospital Stay: Admit: 2015-08-27 | Payer: MEDICARE | Primary: Internal Medicine

## 2015-08-27 LAB — EKG 12-LEAD
Atrial Rate: 137 {beats}/min
P Axis: 78 degrees
P-R Interval: 122 ms
Q-T Interval: 272 ms
QRS Duration: 60 ms
QTc Calculation (Bazett): 410 ms
R Axis: 73 degrees
T Axis: 38 degrees
Ventricular Rate: 137 {beats}/min

## 2015-08-27 LAB — CBC WITH AUTO DIFFERENTIAL
Basophils %: 0.1 %
Basophils Absolute: 0 10*3/uL (ref 0.0–0.2)
Eosinophils %: 1.6 %
Eosinophils Absolute: 0.1 10*3/uL (ref 0.0–0.6)
Hematocrit: 28.2 % — ABNORMAL LOW (ref 36.0–48.0)
Hemoglobin: 9 g/dL — ABNORMAL LOW (ref 12.0–16.0)
Lymphocytes %: 29.6 %
Lymphocytes Absolute: 2.7 10*3/uL (ref 1.0–5.1)
MCH: 29.1 pg (ref 26.0–34.0)
MCHC: 31.9 g/dL (ref 31.0–36.0)
MCV: 91.4 fL (ref 80.0–100.0)
MPV: 6.8 fL (ref 5.0–10.5)
Monocytes %: 13.6 %
Monocytes Absolute: 1.2 10*3/uL (ref 0.0–1.3)
Neutrophils %: 55.1 %
Neutrophils Absolute: 5 10*3/uL (ref 1.7–7.7)
Platelets: 650 10*3/uL — ABNORMAL HIGH (ref 135–450)
RBC: 3.09 M/uL — ABNORMAL LOW (ref 4.00–5.20)
RDW: 16.5 % — ABNORMAL HIGH (ref 12.4–15.4)
WBC: 9.1 10*3/uL (ref 4.0–11.0)

## 2015-08-27 LAB — BASIC METABOLIC PANEL
Anion Gap: 11 (ref 3–16)
BUN: 28 mg/dL — ABNORMAL HIGH (ref 7–20)
CO2: 26 mmol/L (ref 21–32)
Calcium: 8.4 mg/dL (ref 8.3–10.6)
Chloride: 99 mmol/L (ref 99–110)
Creatinine: <0.5 mg/dL — ABNORMAL LOW (ref 0.6–1.1)
GFR African American: >60 (ref >60)
GFR Non-African American: >60 (ref >60)
Glucose: 135 mg/dL — ABNORMAL HIGH (ref 70–99)
Potassium: 4.8 mmol/L (ref 3.5–5.1)
Sodium: 136 mmol/L (ref 136–145)

## 2015-08-27 LAB — POCT GLUCOSE: POC Glucose: 129 mg/dl — ABNORMAL HIGH (ref 70–99)

## 2015-08-27 LAB — PROTIME-INR
INR: 1.45 — ABNORMAL HIGH (ref 0.85–1.15)
Protime: 16.4 s — ABNORMAL HIGH (ref 9.6–13.0)

## 2015-08-27 LAB — APTT: aPTT: 36.1 s — ABNORMAL HIGH (ref 21.0–31.8)

## 2015-08-27 MED ORDER — SODIUM CHLORIDE 0.9 % IV SOLN
0.9 % | INTRAVENOUS | Status: DC
Start: 2015-08-27 — End: 2015-08-28

## 2015-08-27 MED ORDER — FENTANYL CITRATE (PF) 100 MCG/2ML IJ SOLN
100 MCG/2ML | INTRAMUSCULAR | Status: DC | PRN
Start: 2015-08-27 — End: 2015-08-28
  Administered 2015-08-27: 20:00:00 25 ug via INTRAVENOUS

## 2015-08-27 MED ORDER — FENTANYL CITRATE (PF) 100 MCG/2ML IJ SOLN
100 MCG/2ML | INTRAMUSCULAR | Status: DC | PRN
Start: 2015-08-27 — End: 2015-08-28

## 2015-08-27 MED ORDER — NORMAL SALINE FLUSH 0.9 % IV SOLN
0.9 % | INTRAVENOUS | Status: DC | PRN
Start: 2015-08-27 — End: 2015-08-28

## 2015-08-27 MED ORDER — HYDROMORPHONE HCL 2 MG/ML IJ SOLN
2 MG/ML | INTRAMUSCULAR | Status: DC | PRN
Start: 2015-08-27 — End: 2015-08-28

## 2015-08-27 MED ORDER — LIDOCAINE HCL (PF) 1 % IJ SOLN
1 % | Freq: Once | INTRAMUSCULAR | Status: AC | PRN
Start: 2015-08-27 — End: 2015-08-27

## 2015-08-27 MED ORDER — DIPHENHYDRAMINE HCL 50 MG/ML IJ SOLN
50 MG/ML | Freq: Once | INTRAMUSCULAR | Status: AC | PRN
Start: 2015-08-27 — End: 2015-08-27

## 2015-08-27 MED ORDER — FENTANYL CITRATE (PF) 100 MCG/2ML IJ SOLN
100 MCG/2ML | INTRAMUSCULAR | Status: AC | PRN
Start: 2015-08-27 — End: 2015-08-27
  Administered 2015-08-27 (×2): 25 ug via INTRAVENOUS

## 2015-08-27 MED ORDER — IOPAMIDOL 76 % IV SOLN
76 % | Freq: Once | INTRAVENOUS | Status: DC | PRN
Start: 2015-08-27 — End: 2015-08-28

## 2015-08-27 MED ORDER — HYDROCODONE-ACETAMINOPHEN 5-325 MG PO TABS
5-325 MG | ORAL | Status: AC | PRN
Start: 2015-08-27 — End: 2015-08-27

## 2015-08-27 MED ORDER — ONDANSETRON HCL 4 MG/2ML IJ SOLN
4 MG/2ML | Freq: Once | INTRAMUSCULAR | Status: AC
Start: 2015-08-27 — End: 2015-08-27
  Administered 2015-08-27: 14:00:00 4 mg via INTRAVENOUS

## 2015-08-27 MED ORDER — MEPERIDINE HCL 25 MG/ML IJ SOLN
25 MG/ML | INTRAMUSCULAR | Status: DC | PRN
Start: 2015-08-27 — End: 2015-08-28

## 2015-08-27 MED ORDER — FAMOTIDINE 20 MG/2ML IV SOLN
20 MG/2ML | INTRAVENOUS | Status: AC
Start: 2015-08-27 — End: 2015-08-27

## 2015-08-27 MED ORDER — NORMAL SALINE FLUSH 0.9 % IV SOLN
0.9 % | Freq: Two times a day (BID) | INTRAVENOUS | Status: DC
Start: 2015-08-27 — End: 2015-08-28

## 2015-08-27 MED ORDER — ACETAMINOPHEN 325 MG PO TABS
325 MG | ORAL | Status: DC | PRN
Start: 2015-08-27 — End: 2015-08-28

## 2015-08-27 MED ORDER — ACETAMINOPHEN 10 MG/ML IV SOLN
10 MG/ML | Freq: Once | INTRAVENOUS | Status: AC
Start: 2015-08-27 — End: 2015-08-27

## 2015-08-27 MED ORDER — ACETAMINOPHEN 10 MG/ML IV SOLN
10 MG/ML | INTRAVENOUS | Status: AC
Start: 2015-08-27 — End: 2015-08-27
  Administered 2015-08-27: 17:00:00 1000 via INTRAVENOUS

## 2015-08-27 MED ORDER — fentaNYL (DURAGESIC) 25 mcg/hr 1 patch
25 | TRANSDERMAL | Status: AC
Start: 2015-08-27 — End: 2015-09-13
  Administered 2015-08-29 – 2015-09-10 (×5): 1 via TRANSDERMAL

## 2015-08-27 MED ORDER — TPN ADULT (WCH/Drake)
4 | INTRAMUSCULAR | Status: AC
Start: 2015-08-27 — End: 2015-08-28
  Administered 2015-08-27: 22:00:00 via INTRAVENOUS

## 2015-08-27 MED FILL — TRAVASOL 10 % INTRAVENOUS SOLUTION: 10 10 % | INTRAVENOUS | Qty: 799

## 2015-08-27 MED FILL — GABAPENTIN 250 MG/5 ML ORAL SOLUTION: 50 50 mg/mL | ORAL | Qty: 5

## 2015-08-27 MED FILL — HYDROMORPHONE 2 MG/ML INJECTION SYRINGE: 2 2 mg/mL | INTRAMUSCULAR | Qty: 2

## 2015-08-27 MED FILL — FAMOTIDINE (PF) 20 MG/2 ML INTRAVENOUS SOLUTION: 20 20 mg/2 mL | INTRAVENOUS | Qty: 2

## 2015-08-27 MED FILL — OXYCODONE 20 MG/ML ORAL CONCENTRATE: 20 20 mg/mL | ORAL | Qty: 1

## 2015-08-27 MED FILL — FENTANYL CITRATE (PF) 250 MCG/5ML IJ SOLN: 250 MCG/5ML | INTRAMUSCULAR | Qty: 5

## 2015-08-27 MED FILL — OFIRMEV 10 MG/ML IV SOLN: 10 MG/ML | INTRAVENOUS | Qty: 100

## 2015-08-27 MED FILL — FENTANYL CITRATE (PF) 100 MCG/2ML IJ SOLN: 100 MCG/2ML | INTRAMUSCULAR | Qty: 2

## 2015-08-27 MED FILL — HYDROMORPHONE HCL 2 MG/ML IJ SOLN: 2 MG/ML | INTRAMUSCULAR | Qty: 1

## 2015-08-27 MED FILL — SODIUM CHLORIDE 0.9 % IV SOLN: 0.9 % | INTRAVENOUS | Qty: 1000

## 2015-08-27 MED FILL — NORMAL SALINE FLUSH 0.9 % IV SOLN: 0.9 % | INTRAVENOUS | Qty: 10

## 2015-08-27 MED FILL — FAMOTIDINE 20 MG/2ML IV SOLN: 20 MG/2ML | INTRAVENOUS | Qty: 2

## 2015-08-27 MED FILL — ONDANSETRON HCL 4 MG/2ML IJ SOLN: 4 MG/2ML | INTRAMUSCULAR | Qty: 2

## 2015-08-27 MED FILL — ISOVUE-370 76 % IV SOLN: 76 % | INTRAVENOUS | Qty: 50

## 2015-08-27 NOTE — Unmapped (Signed)
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 Presented to [facility] with abdominal distention and fevers.?? Identified as having pelvic and intraabdominal abscess - drained by IR?? - mixed enteric flora with no predominance.?? Readmitted to [facility] with bleeding for wound.?? Course complicated by ischemia to right foot..??     Subjective & Interval History;    Went to IR appointment today. Post visit summery reviewed. New JP drain was placed.    Pt on exam was tearful. C/o abdominal pain.   Not cooperative.      Review of Systems:  10 point ROS was negative except as above.       PMH: Unchanged from H&P.   PSH: Unchanged from H&P.   Family medical history: Unchanged from H&P.   Social history: Unchanged from H&P      Scheduled Meds:  ??? [START ON 08/28/2015] clopidogrel  75 mg Per NG tube Daily 0900   ??? enoxaparin  40 mg Subcutaneous Daily 0900   ??? famotidine (PF)  20 mg Intravenous BID   ??? fentaNYL  1 patch Transdermal Q72H   ??? gabapentin  125 mg Oral 3 times per day   ??? insulin regular  0-5 Units Subcutaneous Daily 0900   ??? oxyCODONE  15 mg Oral 6 times per day     Continuous Infusions:  ??? TPN ADULT ([facility]) 69.8 mL/hr at 08/26/15 1749   ??? TPN ADULT ([facility])       PRN Meds:ALPRAZolam, alteplase, dextrose, dextrose 50 % in water (D50W), diclofenac sodium, heparin lock flush, HYDROmorphone, ipratropium-albuterol, LORazepam, ondansetron    Objective:    Vital signs in last 24 hours:  Temp:  [97.9 ??F (36.6 ??C)-98.8 ??F (37.1 ??C)] 98 ??F (36.7 ??C)  Heart Rate:  [112-128] 128  Resp:  [16-18] 18  BP: (94-98)/(44-63) 98/63 mmHg    Physical Examination:    GENERAL: The patient is awake,not in acute cardiorespiratory distress. Looking chronically sick. Tearful.   HEENT:Atraumatic.Normocephalic. PERRLA, EOMI. No icterus, no pallor, no jugular venous distention..No ear or nose  discharge.  NECK: Supple. Trachea is in midline. No Thyromegaly  CHEST: Symmetric. Clear to auscultation, bilateral equal air entry. No wheezing or rhonchi.  CARDIOVASCULAR: S1, S2, Regular pulse. No murmur or gallops. No JVD.  ABDOMEN: Whole anterior abdomen is covered with pouch. Brownish fistula output noted.Bilateral nephrostomy tubes.  EXTREMITIES: There is + BLE edema . Very tender to touch both legs.  Right foot has dressing in place.   Skin;No bruise,No rash.No petechiae.  CNS; CN II-XII grossly intact. No focal neurological deficit.       Intake/Output last 3 shifts:  I/O last 3 completed shifts:  In: 2626 [P.O.:120; I.V.:1581]  Out: 875 [Urine:875]    Recent Labs;                           Lab name 08/19/15  0520 08/22/15  0506 08/26/15  0521   WBC 8.8 6.6 8.8   HEMOGLOBIN 8.7* 7.9* 8.3*   HEMATOCRIT 27.4* 25.4* 26.3*   MEAN CORPUSCULAR VOLUME 92.9 94.3 91.5   PLATELETS 638* 418* 533*  Lab name 08/19/15  0520 08/22/15  0506 08/26/15  0521   SODIUM 135 136 138   POTASSIUM 4.5 4.3 4.5   CREATININE 0.54* 0.42* 0.50*   BUN 30* 24 26*   CHLORIDE 100 107 103   CO2 30 27 27    PHOSPHORUS 4.3 3.5 3.8   MAGNESIUM 2.0 1.9 1.7                         Lab name 08/05/15  0351 08/12/15  0521 08/19/15  0520 08/26/15  0521   AST 17 14 23 23    ALT 16 15 26 17    BILIRUBIN TOTAL 0.2 0.2 0.3 0.3       Lab name 07/05/15  0702  07/09/15  0544  08/19/15  0520 08/22/15  0506 08/26/15  0521   PREALBUMIN 14.0*  --  16.0*  --   --   --   --    ALBUMIN  --   < > 1.5*  < > <1.5*   <1.5* <1.5* <1.5*   <1.5*   < > = values in this interval not displayed.                                                                                                   Lab name 08/22/15  0506 08/26/15  0521   CALCIUM 7.5* 8.0*   PHOSPHORUS 3.5 3.8                              Lab name 08/19/15  0520 08/26/15  0521   TRIGLYCERIDES 115 96                        Lab name 08/05/15  0900 08/07/15  0850  08/10/15  0830   VANCOMYCIN TROUGH 9.3* 9.1* 17.4   CT abdomen 3/24  1. Multiple abscess, large pelvic abscess with surgical drain,  right issue rectal fossa.  2. 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  3. Granulation tissue enhancement or blood extravasation in the  lower abdominal wall as described  4. Proximity of the small bowel vasculature at the level of the  open wound on the right, side of active bleeding  5. Lower lobe bronchopneumonia and aspiration is also considered    Diet;    Diet Orders          TPN ADULT ([facility]) at 69.8 mL/hr starting at 05/16 1800    TPN ADULT ([facility]) at 69.8 mL/hr starting at 05/15 1800    Diet NPO effective now Except for: except ice chips, except sips of clears starting at 03/28 0959            Assessment/Plan:      1. Peritoneal abscess- Mixed flora, pigtail drain 06/26/15 . Vanco/Zosyn/Diflucan.           -Completed  6 week Abx on 08/12/15.      2. Enterocutaneous Fistula- wound care, bowel rest. Still  has significant output.    3. Severe intractable acute on chronic pain syndrome- receiving Dilaudid IV every 3 hr  Prn,Fentanyl patch decreased to 50  from 75  mcg. Started PO Oxycodone scheduled as she had better response with it previously. Added Gabapentin as she was on previously.         Consulted Psychiatry for depression, chronic pain and recommended SNRIs to help with pain and depression.She want to try therapy first.     4. Thrombocytosis. uncertain etiology- follow closely. Likely reactive.    5. Cachexia-   continue TPN, recheck PAB. Albumin stays less than 1.5.    6. Sinus tachycardia. Likely related to pain. Monitor.    7. Ischemic feet/arterial thromboemboli- The patient had a clot in her femoral artery right leg that was cleared by vascular surgery.  However the patient ended up with necrosis at her distal digits which may be a combination of lack of blood flow and the use of pressors. Refusing  heparin, aspirin and Plavix. Refused all heparin injections previously as well. Canceled aspirin but continue to encourage Plavix..           Following podiatrist at [facility].    8. History of colon cancer s/p exenteration with bilateral nephrostomy tubes    9. Recurrent presacral abscesses. Completed the course of Abx.    10. Debility- PT/OT    11. Guarded prognosis.     12. On TPN. 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 Cant tolerate PO. Weekly LFT while on TPN. TPN being managed by pharmacy. Electrolytes replacement through TPN. LFT are WNL so far.Mg being replaced through TPN.    13. D/C planning . Family meeting held on  5/3.  Pt need extensive wound care, TPN, IV Pain meds which are hurdle to discharge.    14. Nausea. Increased frequency of Zofran.    15. Right nephrostomy tube dislodged and leaking. IR referral placed.     Continue pain control. Now pt is skipping her scheduled Oxycodone and prefer IV Dilaudid. Decreasing Fentanyl patch gradually. Will start 25 mcg once current patch of 50 mcg is expired on 5/18. Could not rule out narcotics abuse.     Continue TPN.  Pt not making good progress. SNF /rehab would be good place for her but IV Dilaudid is obstruction.      Prophylaxis;  GI Prophylaxis: Pepcid  DVT Prophylaxis: Lovenox     Code Status: Full Code      Candus Braud MD.       08/27/2015

## 2015-08-27 NOTE — Unmapped (Signed)
Consulted to see pt regarding abdominal wound, foot wound.  After discussing with the wound team it was decided she regularly follows up with an outside wound clinic and should continue for continuity's sake. Will not plan to see her at this time.

## 2015-08-27 NOTE — Unmapped (Signed)
Pharmacy Nutrition Support Service    Linda Ponce is a 45 y.o. female on TPN.  Pharmacy was consulted for monitoring and adjustment.    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    Current TPN Formula (further increased on 4/21)   Based on current weight of 40.8 kg   Amino Acids: 47.7 g/L (80 grams, 1.96 grams/kg/day, 320 kcal)   Dextrose: 175 g/L (293 grams, 997 kcal)   Lipids: 21 g/L (35.2 grams, 352 kcal)   Total Daily Calories: 1669 kcal (41 kcal/kg/day)   Volume: 1675 mL   Rate: 69.8 mL/h    Pt Weights:  40.8 kg (08/17/15)    40.9 kg (08/11/15)    40.8 kg (08/03/15)    38.7 kg (07/31/15)    39.8 kg (07/27/15)    42.8 kg (07/21/15)    TPN Medication Recent History (Show up to 1 orders; newest on the left.)     Start date and time   08/27/2015 1800      TPN ADULT ([facility]) [[id number]]    Order Status  Active       Macro Ingredients    amino acid 10%  47.7 g/L    dextrose 70%  175 g/L       QS Base    sterile water  266.73 mL       Lipids    fat emulsion 30 %  21 g/L       TPN Electrolytes entered as PER DAY amount    sodium chloride  80 mEq    magnesium sulfate  20 mEq    calcium gluconate  9.6 mEq    potassium chloride  20 mEq    potassium phosphate  18 mmol       TPN Additives entered as PER DAY amount    MVI adult no. 1 with vitamin K  10 mL    ascorbic acid (vitamin C)  200 mg    trace elements (MULTITRACE-5)  1 mL       Calorie Contribution    Proteins  319.6 kcal    Dextrose  997.22 kcal    Lipids  351.9 kcal    Total  1,668.72 kcal       Electrolyte Ion Calculated Amount    Sodium  80 mEq    Potassium  46.4 mEq    Calcium  9.6 mEq    Magnesium  20 mEq    Aluminum  --    Phosphate  18 mmol    Chloride  100 mEq    Acetate  --       Other    Total Protein  79.9 g    Total Protein/kg  1.96 g/kg    Glucose Infusion Rate  4.99 mg/kg/min    Osmolarity  1,552.88    Volume  1,675 mL    Rate  69.8 mL/hr    Dosing Weight  40.8 kg    Infusion Site  Central        Diet Orders          TPN  ADULT ([facility]) at 69.8 mL/hr starting at 05/16 1800    TPN ADULT ([facility]) at 69.8 mL/hr starting at 05/15 1800    Diet NPO effective now Except for: except ice chips, except sips of clears starting at 03/28 0959          TPN Associated Medications:    - GI Prophylaxis: famotidine   - Antiemetic: ondansetron PRN   -  Bowel Regimen: none   - Insulin Regimen: sliding scale regular insulin     Recent Labs:      Lab 08/26/15  0521 08/22/15  0506   SODIUM 138 136   POTASSIUM 4.5 4.3   CHLORIDE 103 107   CO2 27 27   BUN 26* 24   CREATININE 0.50* 0.42*   GLUCOSE 95 83   CALCIUM 8.0* 7.5*   PHOSPHORUS 3.8 3.5   MAGNESIUM 1.7 1.9   Free calcium (08/26/15): 4.9, goal 4.40 - 5.40 mg/dL      Lab 16/10/96  0454 08/22/15  0506   ALK PHOS 200*  --    ALT 17  --    AST 23  --    BILIRUBIN TOTAL 0.3  --    BILIRUBIN DIRECT 0.20  --    TOTAL PROTEIN 5.0*  --    ALBUMIN <1.5*   <1.5* <1.5*         Lab 08/26/15  0521   TRIGLYCERIDES 96         Assessment and Plan:    - Macronutrients: formulation was increased on 4/17 and again on 4/21 as per dietary's recommendations. Continue same formulation.  - Electrolytes: no labs drawn today. Continue same formulation.  - Multivitamin/Trace Elements: provided daily in TPN.   - Glucose: has been WNL, no sliding scale insulin doses required since 4/14.  - Other additives: ascorbic acid 200 mg added to each bag considering GI output and wound healing.  - Check labs twice weekly: Monday/Thursday.    Thank you for the consult.    Kristie Cowman, PharmD  08/27/2015 8:28 AM

## 2015-08-27 NOTE — Unmapped (Signed)
Pt arrived back from [facility] around 5 pm. Pt is alert and oriented with stable vital signs. Patient is c/o pain in bilateral legs and abdomen. Rates pain 9/10. Patient has been repositioned and offered ice packs and prn pain medications.  Tolerates repositioning fairly well.  Given information on pain relieving techniques, patient voices understanding and states that his pain is tolerable at this time.

## 2015-08-27 NOTE — Unmapped (Signed)
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   Adequate intake via PO and TPN to support healing of areas on skin as diagnosis allows.   Outcome: Progressing  Weight up to 96.8 lb but remains within previous range 85-100 lb  Tolerates TPN without complications.   BUN elevated - but mildly improved  Blood sugars in adequate control   LFT WNL x Alk phos elevated but ~ same as last week   Albumin remains low - likely r/t chronic inflammation  Toes gangrenous, Left lateral abdomen fistula no change, Mid abdominal wound with fistula improved

## 2015-08-27 NOTE — Unmapped (Signed)
[name]  Nutrition Progress Note    Nutrition Diagnosis:   1. Altered GI function - Continues    New Nutrition Diagnosis:  No    Diet: NPO with ice chips and sips of clears.   TPN: Total Volume 1677ml/day rate 69.8 ml/hr:  AAcids 80g/day, Dextrose 293g/day, Lipid  35.2g/d per day to provide kcal 1660     Weight: (!) 89 lb 14.4 oz (40.778 kg) (08/17/15 1400)     Skin Condition:   Left lateral abdomen fistula no change, Mid abdominal wound with fistula improved. Right foot vascular wounds no significant change,  Pt refusing dressing changes per WCT note ro right foot.  Right toes are gangrenous per [facility] notes.  Edema: Right LE nonpitting    Labs:   Lab Results   Component Value Date    PREALBUMIN 16.0* 07/09/2015     Lab Results   Component Value Date    ALBUMIN <1.5* 08/26/2015     Lab Results   Component Value Date    ALT 17 08/26/2015    AST 23 08/26/2015    ALKPHOS 200* 08/26/2015    BILITOT 0.3 08/26/2015     Lab Results   Component Value Date    TRIG 96 08/26/2015     Lab Results   Component Value Date    CREATININE 0.50* 08/26/2015    BUN 26* 08/26/2015    NA 138 08/26/2015    K 4.5 08/26/2015    CL 103 08/26/2015    CO2 27 08/26/2015     Lab Results   Component Value Date    OSMOLALITY 291 08/26/2015     Lab Results   Component Value Date    CALCIUM 8.0* 08/26/2015    PHOS 3.8 08/26/2015    GLUCOSE 95 08/26/2015        Lab Results   Component Value Date    WBC 8.8 08/26/2015    HGB 8.3* 08/26/2015    HCT 26.3* 08/26/2015    MCV 91.5 08/26/2015    PLT 533* 08/26/2015         Component Value Date/Time    POCGMD 142* 08/26/2015 1130    POCGMD 125* 08/25/2015 0558    POCGMD 116* 08/24/2015 2120    POCGMD 118* 08/24/2015 1114    POCGMD 105* 08/23/2015 1620     No results found for: VITD25H      Scheduled Meds:  ??? [START ON 08/28/2015] clopidogrel  75 mg Per NG tube Daily 0900   ??? enoxaparin  40 mg Subcutaneous Daily 0900   ??? famotidine (PF)  20 mg Intravenous BID   ??? fentaNYL  1 patch Transdermal Q72H   ???  gabapentin  125 mg Oral 3 times per day   ??? insulin regular  0-5 Units Subcutaneous Daily 0900   ??? oxyCODONE  15 mg Oral 6 times per day     Continuous Infusions:  ??? TPN ADULT ([facility]) 69.8 mL/hr at 08/26/15 1749   ??? TPN ADULT ([facility])       PRN Meds:.ALPRAZolam, alteplase, dextrose, dextrose 50 % in water (D50W), diclofenac sodium, heparin lock flush, HYDROmorphone, ipratropium-albuterol, LORazepam, ondansetron    Note: Weight increased per 5/14 wt.  TPN infusing without any complications. BUN increased but mildly improved.. Na WNL. Received IVF NaCl x 48 hr on 5/9 -5/11. Alk Phos stable from last week.  AST/ALT WNL.  Blood sugars in adequate control. Ascorbic acid added to each bag r/t GI output and wound healing. TPN  provides 2.0 g/kg protein - considered adequate to aid wound healing. Albumin remains low at 1.5 likely r/t chronic inflammation, last CRP elevated at 96.    Nutrition Prescription:   Food and Nutrition Therapy: Rec continue current  TPN macronutrient levels.  Monitoring: weights, hydration,  Labs, skin condition    Plan of Care:Update/revised    Follow Up: Other: 7-10 days    Mackey Birchwood RDN, LD  Pager [phone] Phone [phone]    08/27/2015 4:16 PM

## 2015-08-27 NOTE — Progress Notes (Signed)
Spoke with sister (April) to update on pt status.

## 2015-08-27 NOTE — Progress Notes (Signed)
Pt resting. IV tylenol complete. Dr. Shelda Altes at bedside, states pt can move to phase 2. VSS,Phase 1 discharge criteria met.

## 2015-08-27 NOTE — Progress Notes (Signed)
Arrived from IR to pacu. Awakening. Resps adequate on RA. BP 72/37. Treated by CRNA. Dressing to L abdomen CDI with JP drain.

## 2015-08-27 NOTE — Anesthesia Post-Procedure Evaluation (Signed)
Anesthesia Post-op Note    Patient: Erica Harding  MRN: [id number]  Birthdate: 16-Apr-1970  Date of evaluation: 08/27/2015  Time:  12:56 PM     Procedure(s) Performed:     Last Vitals: BP 100/63  Pulse 103  Temp 98.6 F (37 C) (Temporal)   Resp 18  Ht 5\' 2"  (1.575 m)  Wt 90 lb (40.8 kg)  SpO2 94%  BMI 16.46 kg/m2    Aldrete Phase I: Aldrete Score: 5    Aldrete Phase II:      Anesthesia Post Evaluation    Final anesthesia type: general  Patient location during evaluation: PACU  Patient participation: complete - patient participated  Level of consciousness: awake  Pain score: 2  Airway patency: patent  Nausea & Vomiting: no nausea  Complications: no  Cardiovascular status: blood pressure returned to baseline  Respiratory status: acceptable  Hydration status: euvolemic        Wray Kearns, MD  12:56 PM

## 2015-08-27 NOTE — Progress Notes (Signed)
Pt awake and moaning in bed, c/o feeling severe pain "all over." Current BP 84/51, HR 125, RR 25 with 100% spo2 on RA. Notified anesthesia, new orders received.

## 2015-08-27 NOTE — Progress Notes (Signed)
Pt arrived from pacu in stable condition. Pt repositioned for comfort. Dressing to L abdomen clean dry and intact. Will call transportation and give [name] report. Will continue to monitor.

## 2015-08-27 NOTE — Progress Notes (Signed)
Report called to Derrill Kay at [place], the nurse caring for pt. All questions and concerned answered by Maudie Mercury, RN.

## 2015-08-27 NOTE — Progress Notes (Signed)
Pt arrives to pre procedure area in stable condition from home. Vital signs stable.  Assessment completed see flow sheet. Central line intact.  NS hung at 50 cc/hr. Procedure explained to pt who states understanding and questions answered. Consent signed.

## 2015-08-27 NOTE — Anesthesia Pre-Procedure Evaluation (Signed)
Department of Anesthesiology  Preprocedure Note       Name:  Erica Harding   Age:  45 y.o.  DOB:  1971/01/02                                          MRN:  [id number]         Date:  08/27/2015      Surgeon:    Procedure:    Medications prior to admission:   Prior to Admission medications    Medication Sig Start Date End Date Taking? Authorizing Provider   fentaNYL (DURAGESIC) 50 MCG/HR Place 1 patch onto the skin every 72 hours .   Yes Historical Provider, MD   gabapentin (NEURONTIN) 100 MG capsule Take 100 mg by mouth 3 times daily   Yes Historical Provider, MD   oxyCODONE (ROXICODONE INTENSOL) 100 MG/5ML concentrated solution Take 15 mg by mouth every 4 hours as needed for Pain .   Yes Historical Provider, MD   HYDROmorphone (DILAUDID) 1 MG/ML injection Infuse 1 mL intravenously every 2 hours as needed for Pain .  Patient taking differently: Infuse 1 mg intravenously every 3 hours  . 07/04/15  Yes [name], MD   famotidine (PEPCID) 20 MG/2ML injection Infuse 2 mLs intravenously daily 07/04/15  Yes [name], MD   vancomycin (VANCOCIN) 500 MG/100ML IVPB Infuse 150 mLs intravenously every 24 hours 07/08/15   Noah Charon, MD   acetaminophen (OFIRMEV) 10 MG/ML SOLN infusion Infuse 74.1 mLs intravenously every 6 hours as needed for Fever or Pain 07/04/15   [name], MD   aspirin 81 MG chewable tablet 1 tablet by Per NG tube route daily 07/04/15   [name], MD   LORazepam (ATIVAN) 2 MG/ML injection Infuse 0.5 mLs intravenously every 4 hours as needed (anxiety) 07/04/15   [name], MD   ipratropium-albuterol (DUONEB) 0.5-2.5 (3) MG/3ML SOLN nebulizer solution Inhale 3 mLs into the lungs every 4 hours as needed for Shortness of Breath 07/04/15   [name], MD   glucose (GLUTOSE) 40 % GEL Take 15 g by mouth as needed (glc<70) 07/04/15   [name], MD   insulin lispro (HUMALOG) 100 UNIT/ML pen Inject 0-6 Units into the skin every 4 hours 07/04/15   [name], MD   ondansetron [facility]) 4  MG/2ML injection Infuse 2 mLs intravenously every 6 hours as needed for Nausea 07/04/15   [name], MD   fluconazole (DIFLUCAN) 200MG Roberts Gaudy IVPB Infuse 100 mLs intravenously every 24 hours 07/04/15   [name], MD   magnesium sulfate 10-5 MG/ML-% Infuse 100 mLs intravenously as needed (Per IV Magnesium Replacement Protocol) 07/04/15   [name], MD   potassium chloride 20 MEQ/50ML IVPB Infuse 50 mLs intravenously as needed (Per IV Potassium Replacement Protocol) 07/04/15   [name], MD   clopidogrel (PLAVIX) 75 MG tablet 1 tablet by Per NG tube route daily 07/04/15   [name], MD   fat emulsion 20 % infusion Infuse 250 mLs intravenously twice a week 07/04/15   [name], MD       Current medications:    Current Outpatient Prescriptions   Medication Sig Dispense Refill   . fentaNYL (DURAGESIC) 50 MCG/HR Place 1 patch onto the skin every 72 hours .     . gabapentin (  NEURONTIN) 100 MG capsule Take 100 mg by mouth 3 times daily     . oxyCODONE (ROXICODONE INTENSOL) 100 MG/5ML concentrated solution Take 15 mg by mouth every 4 hours as needed for Pain .     [name] HYDROmorphone (DILAUDID) 1 MG/ML injection Infuse 1 mL intravenously every 2 hours as needed for Pain . (Patient taking differently: Infuse 1 mg intravenously every 3 hours  .) 1 mL 0   . famotidine (PEPCID) 20 MG/2ML injection Infuse 2 mLs intravenously daily 2 mL 0   . vancomycin (VANCOCIN) 500 MG/100ML IVPB Infuse 150 mLs intravenously every 24 hours 2500 mL 0   . acetaminophen (OFIRMEV) 10 MG/ML SOLN infusion Infuse 74.1 mLs intravenously every 6 hours as needed for Fever or Pain 4000 mL 0   . aspirin 81 MG chewable tablet 1 tablet by Per NG tube route daily 30 tablet 3   . LORazepam (ATIVAN) 2 MG/ML injection Infuse 0.5 mLs intravenously every 4 hours as needed (anxiety) 1 mL 0   . ipratropium-albuterol (DUONEB) 0.5-2.5 (3) MG/3ML SOLN nebulizer solution Inhale 3 mLs into the lungs every 4 hours as needed for Shortness of Breath 360 mL 0   .  glucose (GLUTOSE) 40 % GEL Take 15 g by mouth as needed (glc<70) 45 g 1   . insulin lispro (HUMALOG) 100 UNIT/ML pen Inject 0-6 Units into the skin every 4 hours 5 Pen 3   . ondansetron (ZOFRAN) 4 MG/2ML injection Infuse 2 mLs intravenously every 6 hours as needed for Nausea 15 mL 0   . fluconazole (DIFLUCAN) 200MG /100ML IVPB Infuse 100 mLs intravenously every 24 hours 100 mL 0   . magnesium sulfate 10-5 MG/ML-% Infuse 100 mLs intravenously as needed (Per IV Magnesium Replacement Protocol) 100 mL 0   . potassium chloride 20 MEQ/50ML IVPB Infuse 50 mLs intravenously as needed (Per IV Potassium Replacement Protocol) 50 mL 0   . clopidogrel (PLAVIX) 75 MG tablet 1 tablet by Per NG tube route daily 30 tablet 3   . fat emulsion 20 % infusion Infuse 250 mLs intravenously twice a week 250 mL 0     No current facility-administered medications for this encounter.        Allergies:    Allergies   Allergen Reactions   . Latex Itching   . Compazine [Prochlorperazine Maleate] Swelling       Problem List:    Patient Active Problem List   Diagnosis Code   . AKI (acute kidney injury) ([place]) N17.9   . Hyperkalemia E87.5   . Open wnd ant abdomen-comp S31.109A   . Peritonitis and retroperitoneal infections ([place]) K65.9   . 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 Ischemic foot I99.8   . Cachexia ([place]) R64   . Postprocedural intra-abdominal sepsis ([facility]) T81.4XXA, A41.9   . Abscess L02.91   . Dehydration E86.0   . Encephalopathies G93.40   . 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 Abnormal CT of the chest R93.8   . Infiltrate noted on imaging study R93.8   . Open abdominal wall wound S31.109A       Past Medical History:        Diagnosis Date   . AKI (acute kidney injury) ([place])    . Colorectal cancer ([place])    . Encephalopathies    . Fistula    . Gangrene ([place])     right foot   . Peritonitis ([place])        Past  Surgical History:        Procedure Laterality Date   . ABDOMEN SURGERY     . COLONOSCOPY     . HYSTERECTOMY         Social History:     Social History   Substance Use Topics   . Smoking status: Never Smoker   . Smokeless tobacco: Not on file   . Alcohol use No                                Counseling given: Not Answered      Vital Signs (Current):   Vitals:    08/27/15 0944   BP: 100/63   Pulse: 103   Resp: 18   Temp: 98.6 F (37 C)   TempSrc: Temporal   SpO2: 94%   Weight: 90 lb (40.8 kg)   Height: 5\' 2"  (1.575 m)                                              BP Readings from Last 3 Encounters:   08/27/15 100/63   08/13/15 (!) 90/57   08/06/15 96/64       NPO Status:                                                                                 BMI:   Wt Readings from Last 3 Encounters:   08/27/15 90 lb (40.8 kg)   07/17/15 100 lb (45.4 kg)   07/05/15 101 lb 3.1 oz (45.9 kg)     Body mass index is 16.46 kg/(m^2).    Anesthesia Evaluation  Patient summary reviewed and Nursing notes reviewed  Airway: Mallampati: II  TM distance: >3 FB   Neck ROM: full  Mouth opening: > = 3 FB Dental:          Pulmonary:          Cardiovascular:  Exercise tolerance: poor (<4 METS),                  Neuro/Psych:         Comments: ENCEPHALOPATHY GI/Hepatic/Renal:   (+) renal disease: CRI,         Endo/Other:             Comments: SEPSIS Abdominal:                    Anesthesia Plan    ASA 3     general     intravenous induction   Anesthetic plan and risks discussed with patient.    Plan discussed with CRNA.  Attending anesthesiologist reviewed and agrees with Pre Eval content          Brown HumanNabil W Lakeesha Fontanilla, MD   08/27/2015

## 2015-08-27 NOTE — Progress Notes (Signed)
Pt moaning and appears tearful, c/o feeling severe pain. Notified anesthesia, new orders received.

## 2015-08-27 NOTE — Progress Notes (Signed)
Fentanyl IVP given as ordered, pt now resting in bed with eyes closed, pt appears comfortable. Awaiting transport, will monitor.

## 2015-08-27 NOTE — Progress Notes (Signed)
Transportation at bedside to transport pt to [facility]. Pt being discharged with TPN infusing and central line in place.

## 2015-08-28 LAB — POC GLU MONITORING DEVICE: POC Glucose Monitoring Device: 136 mg/dL (ref 70–100)

## 2015-08-28 MED ORDER — HYDROmorphone (DILAUDID) injection Syrg 4 mg
2 | INTRAMUSCULAR | Status: AC | PRN
Start: 2015-08-28 — End: 2015-09-02
  Administered 2015-08-28 – 2015-09-02 (×27): 4 mg via INTRAVENOUS

## 2015-08-28 MED ORDER — TPN ADULT (WCH/Drake)
10 | INTRAVENOUS | Status: AC
Start: 2015-08-28 — End: 2015-08-29
  Administered 2015-08-28: 21:00:00 via INTRAVENOUS

## 2015-08-28 MED FILL — FAMOTIDINE (PF) 20 MG/2 ML INTRAVENOUS SOLUTION: 20 20 mg/2 mL | INTRAVENOUS | Qty: 2

## 2015-08-28 MED FILL — GABAPENTIN 250 MG/5 ML ORAL SOLUTION: 50 50 mg/mL | ORAL | Qty: 5

## 2015-08-28 MED FILL — HYDROMORPHONE 2 MG/ML INJECTION SYRINGE: 2 2 mg/mL | INTRAMUSCULAR | Qty: 2

## 2015-08-28 MED FILL — TRAVASOL 10 % INTRAVENOUS SOLUTION: 10 10 % | INTRAVENOUS | Qty: 799

## 2015-08-28 MED FILL — OXYCODONE 20 MG/ML ORAL CONCENTRATE: 20 20 mg/mL | ORAL | Qty: 1

## 2015-08-28 MED FILL — ONDANSETRON HCL (PF) 4 MG/2 ML INJECTION SOLUTION: 4 4 mg/2 mL | INTRAMUSCULAR | Qty: 2

## 2015-08-28 MED FILL — CLOPIDOGREL 75 MG TABLET: 75 75 mg | ORAL | Qty: 1

## 2015-08-28 MED FILL — ENOXAPARIN 40 MG/0.4 ML SUBCUTANEOUS SYRINGE: 40 40 mg/0.4 mL | SUBCUTANEOUS | Qty: 0.4

## 2015-08-28 NOTE — Unmapped (Signed)
[facility] Inpatient Medical Psychology Service  [facility] of Neurology & Rehabilitation Medicine  Division of Neuropsychology & Medical Psychology     INPATIENT MEDICAL PSYCHOLOGY PROGRESS NOTE    Patient Name:  Linda Ponce          DOB:  [birth year]  Admit Date:  07/08/2015   CSN:  [id number]    Referring Provider:  Valda Lamb, MD  Location:  [name] Room N319/DN319-02     Current Date: 08/28/2015  Time and Duration of Service: 8:50p-10:20p = 90 minutes face to face time     CPT Code:  09811 - Health & Behavior Intervention - Individual - 6 units,  90 minutes face to face time    Current Problem List:      Patient Active Problem List   Diagnosis   ??? Abdominal abscess   ??? Enterocutaneous fistula   ??? Malnutrition   ??? Fistula   ??? Adjustment disorder with mixed anxiety and depressed mood   ??? Pain of multiple sites   ??? Cognitive dysfunction-recent onset.       Current Medications:     Scheduled Medications:    ??? clopidogrel  75 mg Per NG tube Daily 0900   ??? enoxaparin  40 mg Subcutaneous Daily 0900   ??? famotidine (PF)  20 mg Intravenous BID   ??? fentaNYL  1 patch Transdermal Q72H   ??? fentaNYL  1 patch Transdermal Q72H   ??? gabapentin  125 mg Oral 3 times per day   ??? insulin regular  0-5 Units Subcutaneous Daily 0900   ??? oxyCODONE  15 mg Oral 6 times per day     PRN Medications:  ALPRAZolam, alteplase, dextrose, dextrose 50 % in water (D50W), diclofenac sodium, heparin lock flush, HYDROmorphone, ipratropium-albuterol, LORazepam, ondansetron    Objective Presentation/Subjective Concerns and Related Interventions:      ?? I had initially stopped to see her at 3pm, but she was drowsy and requested that I return, which was late, but she was awake due to pain.   ?? Pt presented as very upset due to severe back pain from nephrostomy draining and tube replacement as her scheduled oxycodone pain medication was about 1.5 hours late again. Review of administration  record suggests that this occurs because her meds are due around 8:30p-9p, but nursing starts night med pass about 9pm near the unit desk and her room is at far end of the hall which takes over an hour to get to at times. She was frantic by the time medication arrived and wanted dilaudid first due to faster effect, but ultimately took oxycodone first because the dilaudid wasn't due which is better for longer coverage time.   ?? She reported not being able to do her homework due to being out of it from surgery yesterday (general anesthesia) and not having her I-pad which sister took home with her purse and has not returned yet.   ?? She seemed embarrassed to, once again, be upset due to pain when I visited.     Psychiatric Diagnoses:      ?? Cognitive dysfunction--recent onset.    ?? Likely residual of significant delirium at [facility], pain medications, distraction from pain itself, and possibly IV ABX. Will monotor. Primarily involves orientation to time, and somewhat to place, short term recall, and working memory. Expected to improve as pt's wounds and infections improve.  ?? MMSE-2 score of 21/20 (  T=21) indicates moderate cognitive impairment involving orientation, short term recall, and working memory.   ?? Clinical assessment of common sense reasoning skills indicated normal limits reasoning skills except when her pain gets severe, at which point she becomes fearful and suspicious about medications being withheld because she bother's the nurses too much.    ?? Pain of Multiple Sites.    ?? 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 Pt reports severe pain ranging from 8-10/10 even with fentanyl 125 mcg patch. Dilaudid IV q3h prn reduces it to 5/10 but does not last.  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 No history of narcotic abuse, but she has been on narcotics for abdominal and radiation pain from 2011 cancer surgery and complications x 6 yrs. Best regimen per pt post op at [facility] was oxycodone 15 mg q4h scheduled and dilaudid 1 mg q3h prn which she tapered down before discharge.She also took neurontin 300 mg TID when home prior to surgery for neuropathic pain (low back).  Psychosocial losses (death of father 1 week before surgery, missing teen son who is in [place] w/her mother) reduce ability to cope with pain and hospitalization.     ?? Adjustment disorder with mixed anxiety and depressed mood.  ?? Related to prolonged hospitalization and inadequately controlled pain in abdomen, back, and legs.  ?? She has had NO prior treatment for anxiety or depression or substance use disorders.       INTERVENTIONS:    ?? Reassured her that her upset presents a problem to be solved and is therefore good that I am arrived when I did. Reviewed administration record and flow of nighttime med pass process which revealed that she frequently gets her night time oxycodone late due to her location in the med pass flow as described above.   ?? Devised a schedule to suggest to her MD that will result in her am and pm oxycodone being given at better times in the flow of the unit when delays are less likely. This schedule would be an roxycodone 15 mg q4h6 resulting in administration times of 0600, 1000, 1400, 1800, 2200, and 0200 and so on.    ?? Discussed my ongoing recommendation that she reconsider a trial of Remeron to improve mood, anxiety, and chronic pain. She was more willing to consider it when I informed her that even non-depressed chronic pain patients are put on antidepressants such as cymbalta for pain as it reduces pain intensity via the neurotransmitters that impact mood, sleep and pain perception. She is at least thinking about it now.     ?? Provided her with  extensive support during which her reactions to waiting and pain do help her see how tensing up increases pain and deep breathing makes it more tolerable.    ?? Also discussed using the prayer bead bracelet given to her by her surgeon to facilitate using her faith to help her cope with difficult waits and therapies.       RECOMMENDATIONS & PLAN:     DR SHAHID/ATTENDING MD:  ?? Please consider changing her scheduled oxycodone dose to more specific dosing that will circumvent the frequent lateness of her qhs dose due to workflow issues described above. A schedule that would result in her am and pm oxycodone being given  at better times in the flow of the unit when delays are less likely would be oxycodone 15 mg q4h6 resulting in administration times of 0600, 1000, 1400, 1800, 2200, and 0200 and so on.      ?? Please consider increasing neurontin dose as she is taking the lower dose more regularly now, but is still having strong shooting pains from her back down her legs.    MEDICAL PSYCHOLOGY PLAN:     ?? I will follow up on Friday to see if Jamekia complied with behavioral intervention homework involving:   ?? Using distraction (TV, music) and prayer and prayer beads to cope when in pain,    ?? Looking at at least one of the 5 pain mgmt/relaxation phone Apps I showed her and try some of the exercises at least briefly to help her find several that she likes.   ?? Think seriously about a trial of Remeron for mood, anxiety and chronic pain.     ?? Will continue/resume cognitive restructuring interventions. Will also address fear of activity that can cause pain.    ?? Will re-introduce introduction to CBT chapter from Feeling Good: the new mood therapy on Friday or Monday.       Linda Ainley P. Minerva Ends, PhD  Licensed Insurance risk surveyor in Medical Psychology  [place] Department of Neurology & Rehabilitation Medicine  Office Phone for non-urgent messages/requests:  [phone]  Urgent issues-pager:  [phone]    08/28/2015

## 2015-08-28 NOTE — Unmapped (Signed)
Occupational Therapy   Reason Patient Not Seen         Name: Linda Ponce  DOB: [birth year]  Attending Physician: Valda Lamb, MD  Admission Diagnosis: jewish 07/05/15 dx: sepsis  Date: 08/28/2015    Unable to see patient due to :Wound Care in with pt x 2 hours.  Pt not available during scheduled time.  Will re-attempt as able.    Orpah Melter, OTR/L  [phone]

## 2015-08-28 NOTE — Unmapped (Signed)
Assessment as noted. MD called with directive to use oxycodone as primary pain med. Pt very upset and emotional. Charge RN spoke with pt.Very unhappy and states she is not ready. Emotional support given.

## 2015-08-28 NOTE — Unmapped (Signed)
[place]    [name]   Wound Care Assessment  08/28/2015       Linda Ponce is an 45 y.o. female.  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 Compazine [Prochlorperazine Edisylate]    ??? Latex, Natural Rubber        History:    Past Medical History   Diagnosis Date   ??? Cancer    ??? Abscess of abdominal cavity    ??? Gangrene of foot    ??? AKI (acute kidney injury)    ??? Fistula    ??? Peritonitis and retroperitoneal infections    ??? Cachexia    ??? Hyperkalemia    ??? Dehydration    ??? Encephalopathies    ??? Ischemic foot    ??? 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 Dilaudid IV q3h prn reduces it to 5/10 but does not last.  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 No hist     Past Surgical History   Procedure Laterality Date   ??? Colon surgery       Social History   Substance Use Topics   ??? Smoking status: Never Smoker    ??? Smokeless tobacco: None   ??? Alcohol Use: None     Present admission history of illness/MD note.  39f with history of colorectal CA, initially admitted to [facility] post operatively for continued wound care. Developed bleeding, sent back to [facility] where she was foundto have multiple pelvic abscesses, enterocutaneous fistulas, was kept NPO, started on IV abx, TPN, and returns for continued care.    Skin/Wound Care Assessment.        Consult placed last week for Florencia Reasons, NP, she indicated to [facility] she did not need to follow pt because pt regularly follows up with [facility] wound care.     Braden: 18     Pressure wounds:    Coccyx STAGE:???? III??   Wounds progress:  RESOLVED (08/20/15)  Wound Assessment:??*Pt asked WCT to assess site again, site remains intact and epithelialized, pt asked WCT to place small mepilex border as preventative  _____________________________________________________________________  Surgical wounds:????????????????????????????????????????????????????????????  Mid Abdominal Wound with fistula???? Full Thickness. Measurements: 6 cm L x 2.8 cm W x  D (pt did not allow depth to be taken)  Wounds progress: Improving  Wound Assessment:?? Wound bed with moist bright pink tissue, granulation tissue with areas of mucosal/bowel tissue.  Well defined edges, with some epithelial tissue noted at edges. Overall area decreasing in size. Large amount liquid gold brown effluent draining into foley bag. Periwound skin with increased erythema to right peri-stomal skin, blanchable. Pt reports pouches were changed yesterday, prior to doctor's appt, pouch needed to be changed again today due to leakage.   Treatment/Dressing: Mid abdominal and left abdominal fistula/wounds being managed with Eakins pouches attached to gravity drainage bags. Clean wounds and surrounding skin with soap and water, pat dry. Attach Eakins pouch using Eakins rings to help with adherence. Use  pouch #[id number] for mid abdomen and smaller pouch [phone] for left abdomen. Change pouches weekly and prn for leakages.      ____________________________________________________________________  Surgical wounds:??????  ??????Left Lateral Abdomen Fistula???? Full Thickness. Measurements L 3?? cm X W 0.5 cm X?? D 0.3 cm    Wounds progress: improving  Wound Assessment:??moist pink granulation tissue, epithelializing around edges, dimensions particularly depth have decreased, scant gold effluent in old pouch  Treatment/Dressing:?? Mid abdominal and left abdominal fistula/wounds being managed with Eakins pouches attached to gravity drainage bags. Clean wounds and surrounding skin with soap and water, pat dry. Attach Eakins pouch using Eakins rings to help with  adherence. Use pouch [id number] for mid abdomen and smaller pouch [phone] for left abdomen. Change pouches weekly and prn for leakages.      ____________________________________________________________________  Surgical wounds:????  Drain of left groin ?? ??  Wounds progress:??initial assessment (pt previously had drain to this area, pt went out to appointment yesterday and new drain was placed)  Wound Assessment: JP drain to site, not sutured in place but has securing device, small amount of reddish-brown drainage in bulb, large amount of reddish-brown drainage leaking around insertion site, peri-wound with erythema  Treatment/Dressing: Keep proximal end of drain secured at all times using foley statlock; also tape slack of tubing to leg to prevent pulling. To manage drainage around insertion site: cleanse skin with saline, pat dry, apply Cavilon No Sting spray. Use small Eakin's pouch [id number]. Cut pouch to fit around drain tubing and cut opening in anterior window of pouch, disconnect tubing from JP bulb and statlock but hold tubing to keep secure and prevent pulling out. Feed tubing through pouch from back to front, adhere pouch to skin, resecure tube with statlock and reattach to compressed JP bulb, re-tape tubing to leg, use hy tape to seal opening on anterior portion of pouch. Change weekly and prn. Assess pouch frequently for needing emptied or may connect to foley bag.       _____________________________________________________________________  Surgical wounds:??????  Bilateral nephrostomy drains,??   Wounds progress:??No change  Wound Assessment: right site intact, some MASD to peri-wound, left site with c/d/i dressing dated for 5/15, pt declined WCT assessing this site  Treatment/Dressing: Both to drainage pouches. Dressing changes every 3 days and prn for dressing failure. New dressing order: after clean with CHG, apply a slit Mepilex border, securing around tubing to make it occlusive, then apply another Mepilex  border to insure it is occlusive and to position tubing so that patient is not lying on tubing.    *Due to MASD resultant from fistula leakage, used crusting technique to peri-wound, placed layered split gauze dressing and secured with medipore tape. Pt did not want tegaderm or mepilex due to skin irritation.    ____________________________________________________________________  Vascular Wounds on Right Foot????     Wounds progress:??slight improvement  Wound Assessment:??1st through 5th toes covered with dry black stable eschar, open areas to plantar foot are healing and more superficial open light pink tissue, scant serosanguinous drainage on previous rolled gauze dressing, areas are still extremely painful per pt  Treatment/Dressing: Per Dr. Luan Pulling (pt had appt on 4/25) paint toes daily with betadine, keep dry and keep pressure off toes. Also per appt notes, place 2 layers of tubi-grip to left lower leg from toes to knees - explained this to pt but she refused to allow this nurse to apply.   *Covered with dry rolled gauze after betadine, leaving ends of toes exposed, per pt  request due to pt stating she can't stand when the sheets touch her bare foot.      _____________________________________________________________________  Surgical wounds:  Colostomy/mucsous fistula ????RLQ    Wound Progress:  No change, stable   Wound Assessment:??Moist red budded stoma, peri-wound intact but with increased erythema superior to stoma and to the right of stoma (from fistula leakage). Scant amount of mucous drainage from ostomy, and scant amount thin liquid brown drainage in pouch   Wound Treatment: Right lower quadrant colostomy currently only functioning as mucous fistula. Keep covered with small Myna Bright [phone]. Change weekly and prn dressing failure.       ______________________________________________________________  Right lateral thigh   Stage II  Measures: L 1.5 cm x W 1.5 cm x D 0.01 cm  Wounds progress:?? previously  resolved, now reopened, was unstageable before but now appears as stage II  Wound Assessment:?? partial thickness, moist pink tissue, well-defined attached edges, no drainage  Wound treatment: cleanse with saline, pat dry, cover with mepilex border, change every 3 days and prn.    ______________________________________________________________    Treatment time: approximately 2hr with 2 RNs  ______________________________________________________________  Preventative measures.    Off loading and pressure relief as measures are using an accumax mattress, Turning Wedge, pillows, and waffle boots to aid in off load of pressure points.  Other measures include lotion's and cream's to Hydrate and moisturize dry skin.  Moisture barrier cream [employer] clear is provided to protect groin and peri anal area.. Currently has foley catheter with statlock in place.         Labs:   Lab Results   Component Value Date    GLUCOSE 95 08/26/2015    BUN 26* 08/26/2015    CO2 27 08/26/2015    CREATININE 0.50* 08/26/2015    K 4.5 08/26/2015    NA 138 08/26/2015    CL 103 08/26/2015    CALCIUM 8.0* 08/26/2015     Lab Results   Component Value Date    WBC 8.8 08/26/2015    HGB 8.3* 08/26/2015    HCT 26.3* 08/26/2015    MCV 91.5 08/26/2015    PLT 533* 08/26/2015     No results found for: PTT, INR  Lab Results   Component Value Date    PREALBUMIN 16.0* 07/09/2015     Lab Results   Component Value Date    ALBUMIN <1.5* 08/26/2015    ALBUMIN <1.5* 08/26/2015     Lab Results   Component Value Date    ESR 15 08/04/2015    CRP 96.9* 08/04/2015

## 2015-08-28 NOTE — Unmapped (Signed)
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 Presented to [facility] with abdominal distention and fevers.?? Identified as having pelvic and intraabdominal abscess - drained by IR?? - mixed enteric flora with no predominance.?? Readmitted to [facility] with bleeding for wound.?? Course complicated by ischemia to right foot..??     Subjective & Interval History;    Pt was in deep sleep. It took me to call her twice and soon after waking up, She start complaining that her pain is not under control.'  When asked why she was not taking scheduled Oxycodone and only taking IV Dilaudid, she became up set.  Start crying that he pain is never under control. She feel nauseous and could not take oxycodone.    Review of Systems:  10 point ROS was negative except as above.       PMH: Unchanged from H&P.   PSH: Unchanged from H&P.   Family medical history: Unchanged from H&P.   Social history: Unchanged from H&P      Scheduled Meds:  ??? clopidogrel  75 mg Per NG tube Daily 0900   ??? enoxaparin  40 mg Subcutaneous Daily 0900   ??? famotidine (PF)  20 mg Intravenous BID   ??? [START ON 08/29/2015] fentaNYL  1 patch Transdermal Q72H   ??? fentaNYL  1 patch Transdermal Q72H   ??? gabapentin  125 mg Oral 3 times per day   ??? insulin regular  0-5 Units Subcutaneous Daily 0900   ??? oxyCODONE  15 mg Oral 6 times per day     Continuous Infusions:  ??? TPN ADULT ([facility]) 69.8 mL/hr at 08/27/15 1754   ??? TPN ADULT ([facility])       PRN Meds:ALPRAZolam, alteplase, dextrose, dextrose 50 % in water (D50W), diclofenac sodium, heparin lock flush, HYDROmorphone, ipratropium-albuterol, LORazepam, ondansetron    Objective:    Vital signs in last 24 hours:  Temp:  [97.3 ??F (36.3 ??C)] 97.3 ??F (36.3 ??C)  Heart Rate:  [120] 120  Resp:  [18] 18  BP: (95)/(55) 95/55 mmHg    Physical Examination:    GENERAL: The patient is awake,not in  acute cardiorespiratory distress. Looking chronically sick. Tearful.   HEENT:Atraumatic.Normocephalic. PERRLA, EOMI. No icterus, no pallor, no jugular venous distention..No ear or nose discharge.  NECK: Supple. Trachea is in midline. No Thyromegaly  CHEST: Symmetric. Clear to auscultation, bilateral equal air entry. No wheezing or rhonchi.  CARDIOVASCULAR: S1, S2, Regular pulse. No murmur or gallops. No JVD.  ABDOMEN: Whole anterior abdomen is covered with pouch. Brownish fistula output noted.Bilateral nephrostomy tubes.  EXTREMITIES: There is + BLE edema . Very tender to touch both legs.  Right foot has dressing in place.   Skin;No bruise,No rash.No petechiae.  CNS; CN II-XII grossly intact. No focal neurological deficit.       Intake/Output last 3 shifts:  I/O last 3 completed shifts:  In: 2517 [P.O.:120; I.V.:2397]  Out: 850 [Urine:850]    Recent Labs;                           Lab name 08/19/15  0520 08/22/15  0506 08/26/15  0521   WBC 8.8 6.6 8.8   HEMOGLOBIN 8.7* 7.9* 8.3*   HEMATOCRIT 27.4* 25.4* 26.3*   MEAN CORPUSCULAR VOLUME 92.9  94.3 91.5   PLATELETS 638* 418* 533*                                                            Lab name 08/19/15  0520 08/22/15  0506 08/26/15  0521   SODIUM 135 136 138   POTASSIUM 4.5 4.3 4.5   CREATININE 0.54* 0.42* 0.50*   BUN 30* 24 26*   CHLORIDE 100 107 103   CO2 30 27 27    PHOSPHORUS 4.3 3.5 3.8   MAGNESIUM 2.0 1.9 1.7                         Lab name 08/05/15  0351 08/12/15  0521 08/19/15  0520 08/26/15  0521   AST 17 14 23 23    ALT 16 15 26 17    BILIRUBIN TOTAL 0.2 0.2 0.3 0.3       Lab name 07/05/15  0702  07/09/15  0544  08/19/15  0520 08/22/15  0506 08/26/15  0521   PREALBUMIN 14.0*  --  16.0*  --   --   --   --    ALBUMIN  --   < > 1.5*  < > <1.5*   <1.5* <1.5* <1.5*   <1.5*   < > = values in this interval not displayed.                                                                                                   Lab name 08/22/15  0506 08/26/15  0521   CALCIUM  7.5* 8.0*   PHOSPHORUS 3.5 3.8                              Lab name 08/19/15  0520 08/26/15  0521   TRIGLYCERIDES 115 96                        Lab name 08/05/15  0900 08/07/15  0850 08/10/15  0830   VANCOMYCIN TROUGH 9.3* 9.1* 17.4   CT abdomen 3/24  1. Multiple abscess, large pelvic abscess with surgical drain,  right issue rectal fossa.  2. 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  3. Granulation tissue enhancement or blood extravasation in the  lower abdominal wall as described  4. Proximity of the small bowel vasculature at the level of the  open wound on the right, side of active bleeding  5. Lower lobe bronchopneumonia and aspiration is also considered    Diet;    Diet Orders          TPN ADULT ([facility]) at 69.8 mL/hr starting at 05/17 1800    TPN ADULT ([facility]) at 69.8 mL/hr starting at 05/16 1800    Diet NPO effective now Except for: except  ice chips, except sips of clears starting at 03/28 0959            Assessment/Plan:      1. Peritoneal abscess- Mixed flora, pigtail drain 06/26/15 . Vanco/Zosyn/Diflucan.           -Completed  6 week Abx on 08/12/15.      2. Enterocutaneous Fistula- wound care, bowel rest. Still has significant output.    3. Severe intractable acute on chronic pain syndrome- receiving Dilaudid IV every 3 hr  Prn,Fentanyl patch decreased to 50  from 75  mcg. Started PO Oxycodone scheduled as she had better response with it previously. Added Gabapentin as she was on previously.         Consulted Psychiatry for depression, chronic pain and recommended SNRIs to help with pain and depression.She want to try therapy first.     4. Thrombocytosis. uncertain etiology- follow closely. Likely reactive.    5. Cachexia-   continue TPN, recheck PAB. Albumin stays less than 1.5.    6. Sinus tachycardia. Likely related to pain. Monitor.    7. Ischemic feet/arterial thromboemboli- The patient had a clot in her femoral artery right leg that was  cleared by vascular surgery.  However the patient ended up with necrosis at her distal digits which may be a combination of lack of blood flow and the use of pressors. Refusing heparin, aspirin and Plavix. Refused all heparin injections previously as well. Canceled aspirin but continue to encourage Plavix..           Following podiatrist at [facility].    8. History of colon cancer s/p exenteration with bilateral nephrostomy tubes    9. Recurrent presacral abscesses. Completed the course of Abx.    10. Debility- PT/OT    11. Guarded prognosis.     12. On TPN. 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 Cant tolerate PO. Weekly LFT while on TPN. TPN being managed by pharmacy. Electrolytes replacement through TPN. LFT are WNL so far.Mg being replaced through TPN.    13. D/C planning . Family meeting held on  5/3.  Pt need extensive wound care, TPN, IV Pain meds which are hurdle to discharge.    14. Nausea. Increased frequency of Zofran.    15. Right nephrostomy tube dislodged and leaking. New JP drain was placed on 5/16.    Fentanyl decreased to .  Decreased the frequency of Dilaudid to Q 4 hr PRN. Will try to give her Dilaudid 2 hr after scheduled Oxycodone to have better overlap and consistent pain control.  Pt not making good progress. SNF /rehab would be good place for her but IV Dilaudid is obstruction.      Prophylaxis;  GI Prophylaxis: Pepcid  DVT Prophylaxis: Lovenox     Code Status: Full Code      Shavaughn Seidl MD.       08/28/2015

## 2015-08-28 NOTE — Unmapped (Signed)
Problem: Fall Prevention  Goal: Patient will remain free of falls  Assess and monitor vitals signs, neurological status including level of consciousness and orientation. Reassess fall risk per hospital policy.    Ensure arm band on, uncluttered walking paths in room, adequate room lighting, call light and overbed table within reach, bed in low position, wheels locked, side rails up per policy, and non-skid footwear provided.   Outcome: Progressing  Morse fall scale 55  Alert and oriented times 4  Demanding at times for care and refuses care  Dependant on staff when allows to reposition  Calls out appropriately for assistance and needs  No need for alarms

## 2015-08-28 NOTE — Unmapped (Signed)
Physical Therapy   Reason Patient Not Seen         Name: Linda Ponce  DOB: [birth year]  Attending Physician: Valda Lamb, MD  Admission Diagnosis: jewish 07/05/15 dx: sepsis  Date: 08/28/2015    Reviewed Pertinent hospital course: Yes    Unable to see patient due to :Patient ill Pt refused therapy and stated I just had surgery yesterday, just give me a day, I am drained, I am exhausted. Plan to continue skilled PT to increase general endurance B LE strength and mobility for increased independency with ADLs and return to home.    Dion Saucier, Student PT

## 2015-08-28 NOTE — Unmapped (Signed)
Wound care in with patient for several hours and did all wound care and changed pouches, left abdomen JP drain leaking large amount and placed fistula pouch on area to obtain drainage.   Bilateral nephrostomy tubes draining amber urine and foley with dark amber urine.  Remains on TPN at 69.8 ml hr via subclavin TLC in right neck.  Sipping on clear liquids at times.  Taking her scheduled pain medication and prn's as needed.

## 2015-08-28 NOTE — Unmapped (Signed)
Clinical Pharmacy Service - TPN Progress Note    Linda Ponce is followed by the pharmacy department for monitoring and adjustment of TPN    Assessment/Plan:   ?? No new labs, continue TPN with electrolytes, macronutrients and additives from yesterday, check renal panel twice weekly    Subjective/Objective:    Recent Labs      08/26/15   0521   NA  138   CL  103   CO2  27   BUN  26*   CREATININE  0.50*   K  4.5   PHOS  3.8   MG  1.7   CALCIUM  8.0*   FCAS  4.90   ALBUMIN  <1.5*   <1.5*   GLUCOSE  95   ALKPHOS  200*   AST  23   ALT  17   BILITOT  0.3   BILIDIRECT  0.20   TRIG  96     Recent Labs      08/26/15   1130   POCGMD  142*      Insulin Orders       Dose Frequency Start End    insulin regular (HumuLIN R) injection Soln 0-5 Units 0-5 Units Daily 07/26/2015     Route: Subcutaneous           Diet Orders          TPN ADULT ([facility]) at 69.8 mL/hr starting at 05/16 1800    Diet NPO effective now Except for: except ice chips, except sips of clears starting at 03/28 0959        TPN Medication Recent History (Show up to 1 orders; newest on the left.)     Start date and time   08/27/2015 1800      TPN ADULT ([facility]) [[id number]]    Order Status  Active    Last Given  08/27/2015 1754       Macro Ingredients    amino acid 10%  47.7 g/L    dextrose 70%  175 g/L       QS Base    sterile water  266.73 mL       Lipids    fat emulsion 30 %  21 g/L       TPN Electrolytes entered as PER DAY amount    sodium chloride  80 mEq    magnesium sulfate  20 mEq    calcium gluconate  9.6 mEq    potassium chloride  20 mEq    potassium phosphate  18 mmol       TPN Additives entered as PER DAY amount    MVI adult no. 1 with vitamin K  10 mL    ascorbic acid (vitamin C)  200 mg    trace elements (MULTITRACE-5)  1 mL       Calorie Contribution    Proteins  319.6 kcal    Dextrose  997.22 kcal    Lipids  351.9 kcal    Total  1,668.72 kcal       Electrolyte Ion Calculated Amount    Sodium  80 mEq    Potassium  46.4 mEq    Calcium  9.6 mEq     Magnesium  20 mEq    Aluminum  --    Phosphate  18 mmol    Chloride  100 mEq    Acetate  --       Other    Total Protein  79.9 g    Total  Protein/kg  1.96 g/kg    Glucose Infusion Rate  4.99 mg/kg/min    Osmolarity  1,552.88    Volume  1,675 mL    Rate  69.8 mL/hr    Dosing Weight  40.8 kg    Infusion Site  [facility]           Thank you for the consult,  Andree Elk, PHARMD  08/28/2015 7:40 AM

## 2015-08-29 LAB — RENAL FUNCTION PANEL W/EGFR
Albumin: <1.5 g/dL (ref 3.5–5.7)
Anion Gap: 7 mmol/L (ref 3–16)
BUN: 33 mg/dL (ref 7–25)
CO2: 26 mmol/L (ref 21–33)
Calcium: 7.9 mg/dL (ref 8.6–10.3)
Chloride: 104 mmol/L (ref 98–110)
Creatinine: 0.6 mg/dL (ref 0.60–1.30)
Glucose: 102 mg/dL (ref 70–100)
Osmolality, Calculated: 291 mOsm/kg (ref 278–305)
Phosphorus: 3.7 mg/dL (ref 2.1–4.7)
Potassium: 4.3 mmol/L (ref 3.5–5.3)
Sodium: 137 mmol/L (ref 133–146)
eGFR AA CKD-EPI: >90 See note.
eGFR NONAA CKD-EPI: >90 See note.

## 2015-08-29 LAB — CBC
Hematocrit: 25.5 % — ABNORMAL LOW (ref 35.0–45.0)
Hemoglobin: 7.8 g/dL — ABNORMAL LOW (ref 11.7–15.5)
MCH: 28.2 pg (ref 27.0–33.0)
MCHC: 30.8 g/dL — ABNORMAL LOW (ref 32.0–36.0)
MCV: 91.7 fL (ref 80.0–100.0)
MPV: 6.8 fL — ABNORMAL LOW (ref 7.5–11.5)
Platelets: 573 10*3/uL — ABNORMAL HIGH (ref 140–400)
RBC: 2.78 10*6/uL — ABNORMAL LOW (ref 3.80–5.10)
RDW: 16.4 % — ABNORMAL HIGH (ref 11.0–15.0)
WBC: 11.2 10*3/uL — ABNORMAL HIGH (ref 3.8–10.8)

## 2015-08-29 LAB — POC GLU MONITORING DEVICE: POC Glucose Monitoring Device: 108 mg/dL (ref 70–100)

## 2015-08-29 LAB — MAGNESIUM: Magnesium: 2 mg/dL (ref 1.5–2.5)

## 2015-08-29 MED ORDER — gabapentin (NEURONTIN) 250 mg/5 mL solution 150 mg
50 | Freq: Three times a day (TID) | ORAL | Status: AC
Start: 2015-08-29 — End: 2015-09-13
  Administered 2015-09-03: 01:00:00 150 mg via ORAL

## 2015-08-29 MED ORDER — TPN ADULT (WCH/Drake)
10 | INTRAVENOUS | Status: AC
Start: 2015-08-29 — End: 2015-08-30
  Administered 2015-08-29: 23:00:00 via INTRAVENOUS

## 2015-08-29 MED FILL — OXYCODONE 20 MG/ML ORAL CONCENTRATE: 20 20 mg/mL | ORAL | Qty: 1

## 2015-08-29 MED FILL — HYDROMORPHONE 2 MG/ML INJECTION SYRINGE: 2 2 mg/mL | INTRAMUSCULAR | Qty: 2

## 2015-08-29 MED FILL — GABAPENTIN 250 MG/5 ML ORAL SOLUTION: 50 50 mg/mL | ORAL | Qty: 5

## 2015-08-29 MED FILL — FAMOTIDINE (PF) 20 MG/2 ML INTRAVENOUS SOLUTION: 20 20 mg/2 mL | INTRAVENOUS | Qty: 2

## 2015-08-29 MED FILL — TRAVASOL 10 % INTRAVENOUS SOLUTION: 10 10 % | INTRAVENOUS | Qty: 799

## 2015-08-29 MED FILL — CATHFLO ACTIVASE 2 MG INTRA-CATHETER SOLUTION: 2 2 mg | Qty: 2

## 2015-08-29 MED FILL — ONDANSETRON HCL (PF) 4 MG/2 ML INJECTION SOLUTION: 4 4 mg/2 mL | INTRAMUSCULAR | Qty: 2

## 2015-08-29 MED FILL — FENTANYL 25 MCG/HR TRANSDERMAL PATCH: 25 25 mcg/hr | TRANSDERMAL | Qty: 1

## 2015-08-29 MED FILL — DIPHENHYDRAMINE HCL 50 MG/ML IJ SOLN: 50 MG/ML | INTRAMUSCULAR | Qty: 1

## 2015-08-29 MED FILL — ONDANSETRON HCL 4 MG/2ML IJ SOLN: 4 MG/2ML | INTRAMUSCULAR | Qty: 2

## 2015-08-29 MED FILL — LIDOCAINE HCL (CARDIAC) 20 MG/ML IV SOLN: 20 MG/ML | INTRAVENOUS | Qty: 5

## 2015-08-29 MED FILL — PHENYLEPHRINE HCL 10 MG/ML IJ SOLN: 10 MG/ML | INTRAMUSCULAR | Qty: 1

## 2015-08-29 MED FILL — DEXAMETHASONE SODIUM PHOSPHATE 20 MG/5ML IJ SOLN: 20 MG/5ML | INTRAMUSCULAR | Qty: 5

## 2015-08-29 MED FILL — METOCLOPRAMIDE HCL 5 MG/ML IJ SOLN: 5 MG/ML | INTRAMUSCULAR | Qty: 2

## 2015-08-29 MED FILL — PROPOFOL 200 MG/20ML IV EMUL: 200 MG/20ML | INTRAVENOUS | Qty: 40

## 2015-08-29 NOTE — Unmapped (Signed)
Pt resting quietly in bed, no s/s noted of distress or discomfort. Pt informed this AM per MD of administration orders regarding scheduled pain medication and PRN pain medication, pt verbalized understanding but has needed reinforcement throughout shift. Pt declined majority of head-to-toe assessment. TPN continues to infuse at ordered rate. No adverse events noted throughout shift. Bed remains in lowest position, call light within reach. Will continue to monitor.

## 2015-08-29 NOTE — Unmapped (Signed)
Occupational Therapy   Reason Patient Not Seen         Name: Linda Ponce  DOB: [birth year]  Attending Physician: Valda Lamb, MD  Admission Diagnosis: jewish 07/05/15 dx: sepsis  Date: 08/29/2015      Unable to see patient due to :Patient unwilling to participate.  Pt sleeping on arrival, therapy co-tx posted on her board.  C/o 8/10 pain in belly, back, and a few other places.  Reports she might be willing to try in one hour.   Therapists will re-attempt later if able.    Orpah Melter, OTR/L  [phone]

## 2015-08-29 NOTE — Unmapped (Signed)
Problem: Fall Prevention  Goal: Patient will remain free of falls  Assess and monitor vitals signs, neurological status including level of consciousness and orientation. Reassess fall risk per hospital policy.    Ensure arm band on, uncluttered walking paths in room, adequate room lighting, call light and overbed table within reach, bed in low position, wheels locked, side rails up per policy, and non-skid footwear provided.   Outcome: Progressing  Intervention: Keep call light within reach  Safety precautions in place.

## 2015-08-29 NOTE — Unmapped (Signed)
Problem: Potential for Compromised Skin Integrity  Goal: Skin integrity is maintained or improved  Assess and monitor skin integrity. Identify patients at risk for skin breakdown on admission and per policy. Collaborate with interdisciplinary team and initiate plans and interventions as needed.   Patient's skin integrity is maintained at this time through encouragement of turning/repositioning, pressure reduction techniques, monitoring for episodes of incontinence, and through use of basic skin care techniques keeping skin clean and dry. Pt needs reinforcement regarding skin care issues; remains resistant to interventions.  Intervention: Avoid shearing  Use lift devices to decrease friction and shearing.   Shearing avoided when repositioning pt in bed through appropriate repositioning techniques.   Intervention: Keep skin clean and dry  Skin maintained clean and dry.

## 2015-08-29 NOTE — Unmapped (Signed)
Routine and PRN pain meds given per order; ineffective per Pt. Pt appears to anxious. PRN Xanax and Ativan offered but denied by Pt. Repositioned for comfort. Sub-Jugular lumen is patent. Flushes with ease. Continues TPN, no ASE noted. Foley patent and draining yellow urine. Colostomy bag and wafer intact. Safety precautions in place.

## 2015-08-29 NOTE — Unmapped (Signed)
Physical Therapy   Reason Patient Not Seen         Name: Linda Ponce  DOB: 03-Jun-1970  Attending Physician: Valda Lamb, MD  Admission Diagnosis: jewish 07/05/15 dx: sepsis  Date: 08/29/2015    Reviewed Pertinent hospital course: Yes    Unable to see patient due to :Patient ill Pt unable to participate in therapy due to pt feeling unwell at 1:15pm. Pt stated, I feel very nauseas from the surgery. I feel horrible. Pt stated having 8-9/10 pain in stomach, back and legs, I feel stuffy all over. PT offered to return later that day to which pt said she might participate in an hour. After 1 hour, pt refused again saying, just let me call you. Plan to continue skilled PT to promote return to home and B LE strength and endurance.    Dion Saucier, Student PT

## 2015-08-29 NOTE — Unmapped (Signed)
Physical Therapy    Name: Niamh Rada  DOB: Aug 05, 1970  Attending Physician: Valda Lamb, MD  Admission Diagnosis: jewish 07/05/15 dx: sepsis  Date: 08/29/2015  Pt was seen for Interdisciplinary team meeting today with Social Worker, Case Manager, Charge Nurse, Wound Care Nurse, Dietician, and this physical therapy assistant. POC, goals, and current level of functioning were reviewed with pt. Pt verbalized understanding and had no further questions or concerns. Pt seen by PT and OT. Pt's pain limits participation in therapy. Will continue to encourage and educate pt on benefits of OOB.     D/c plan: plan is to discharge to SNF.     Barriers to Discharge: IV Dilaudid, Wound Care    DME needs: None at this time    Education updated in EPIC.     In collaboration w/supervising PT.    Rudean Hitt, PTA [phone]

## 2015-08-29 NOTE — Unmapped (Signed)
Problem: Discharge Planning  Goal: Patient???s discharge needs are met  Collaborate with interdisciplinary team and initiate plans and interventions as needed.   Outcome: Progressing  Ongoing discharge planning    Comments:   The case management department provided an update regarding patient???s plan of care today during Interdisciplinary Rounds.    With:  Therapy, WCT, Dietician, Nursing, SW, RNCM    Therapy is trying to get patient out of bed every day, but pain is limiting progress.  No diet - still receiving all nutrition through TPN.  Multiple fistulas that are improving.  Pain regimine being adjusted.  Rox Intensol scheduled every 4 hours and IV Dilaudid as needed 2 hours afterwards.  Patient concerned about Medicaid being transferred from [place] to [place].  Message sent to [employer].  Patient participated in rounds and verbalized understanding of plan of care.  Care conference to be scheduled with family for next week.    Discharge recommendation is to SNF    Barriers to discharge at this time include: IV medications

## 2015-08-29 NOTE — Unmapped (Signed)
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 Presented to [facility] with abdominal distention and fevers.?? Identified as having pelvic and intraabdominal abscess - drained by IR?? - mixed enteric flora with no predominance.?? Readmitted to [facility] with bleeding for wound.?? Course complicated by ischemia to right foot..??     Subjective & Interval History;    Again c/o about poor control. States that nurses are messing up with her pain medication schedule.  She did not receive morning Oxycodone. As per RN, She was sleeping at that time and medication was not administered.   Tearful.  C/o abdominal pain and right foot pain.   +tachycardia. BP is in soft side but it is her baseline.    Review of Systems:  10 point ROS was negative except as above.       PMH: Unchanged from H&P.   PSH: Unchanged from H&P.   Family medical history: Unchanged from H&P.   Social history: Unchanged from H&P      Scheduled Meds:  ??? clopidogrel  75 mg Per NG tube Daily 0900   ??? enoxaparin  40 mg Subcutaneous Daily 0900   ??? famotidine (PF)  20 mg Intravenous BID   ??? fentaNYL  1 patch Transdermal Q72H   ??? fentaNYL  1 patch Transdermal Q72H   ??? gabapentin  125 mg Oral 3 times per day   ??? insulin regular  0-5 Units Subcutaneous Daily 0900   ??? oxyCODONE  15 mg Oral 6 times per day     Continuous Infusions:  ??? TPN ADULT ([facility]) 69.8 mL/hr at 08/28/15 1634     PRN Meds:ALPRAZolam, alteplase, dextrose, dextrose 50 % in water (D50W), diclofenac sodium, heparin lock flush, HYDROmorphone, ipratropium-albuterol, LORazepam, ondansetron    Objective:    Vital signs in last 24 hours:  Temp:  [98.1 ??F (36.7 ??C)-98.9 ??F (37.2 ??C)] 98.4 ??F (36.9 ??C)  Heart Rate:  [90-136] 136  Resp:  [18-23] 18  BP: (87-116)/(59-62) 101/59 mmHg    Physical Examination:    GENERAL: The patient is awake,not in acute cardiorespiratory  distress. Looking chronically sick.   HEENT:Atraumatic.Normocephalic. PERRLA, EOMI. No icterus, no pallor, no jugular venous distention..No ear or nose discharge.  NECK: Supple. Trachea is in midline. No Thyromegaly  CHEST: Symmetric. Clear to auscultation, bilateral equal air entry. No wheezing or rhonchi.  CARDIOVASCULAR: S1, S2, Regular pulse. No murmur or gallops. No JVD.  ABDOMEN: Whole anterior abdomen is covered with pouch. Brownish fistula output noted.Bilateral nephrostomy tubes.  EXTREMITIES: There is + BLE edema . Very tender to touch both legs.  Right foot has dressing in place.   Skin;No bruise,No rash.No petechiae.  CNS; CN II-XII grossly intact. No focal neurological deficit.       Intake/Output last 3 shifts:  I/O last 3 completed shifts:  In: 2126.8 [I.V.:1568]  Out: 2175 [Urine:1150; Drains:1025]    Recent Labs;                           Lab name 08/22/15  0506 08/26/15  0521 08/29/15  0648   WBC 6.6 8.8 11.2*   HEMOGLOBIN 7.9* 8.3* 7.8*   HEMATOCRIT 25.4* 26.3* 25.5*   MEAN CORPUSCULAR VOLUME 94.3 91.5 91.7   PLATELETS 418* 533* 573*  Lab name 08/19/15  0520 08/22/15  0506 08/26/15  0521   SODIUM 135 136 138   POTASSIUM 4.5 4.3 4.5   CREATININE 0.54* 0.42* 0.50*   BUN 30* 24 26*   CHLORIDE 100 107 103   CO2 30 27 27    PHOSPHORUS 4.3 3.5 3.8   MAGNESIUM 2.0 1.9 1.7                         Lab name 08/05/15  0351 08/12/15  0521 08/19/15  0520 08/26/15  0521   AST 17 14 23 23    ALT 16 15 26 17    BILIRUBIN TOTAL 0.2 0.2 0.3 0.3       Lab name 07/05/15  0702  07/09/15  0544  08/19/15  0520 08/22/15  0506 08/26/15  0521   PREALBUMIN 14.0*  --  16.0*  --   --   --   --    ALBUMIN  --   < > 1.5*  < > <1.5*   <1.5* <1.5* <1.5*   <1.5*   < > = values in this interval not displayed.                                                                                                   Lab name 08/22/15  0506 08/26/15  0521   CALCIUM 7.5* 8.0*   PHOSPHORUS  3.5 3.8                              Lab name 08/19/15  0520 08/26/15  0521   TRIGLYCERIDES 115 96                        Lab name 08/05/15  0900 08/07/15  0850 08/10/15  0830   VANCOMYCIN TROUGH 9.3* 9.1* 17.4         CT abdomen 3/24  1. Multiple abscess, large pelvic abscess with surgical drain,  right issue rectal fossa.  2. 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  3. Granulation tissue enhancement or blood extravasation in the  lower abdominal wall as described  4. Proximity of the small bowel vasculature at the level of the  open wound on the right, side of active bleeding  5. Lower lobe bronchopneumonia and aspiration is also considered    Diet;    Diet Orders          TPN ADULT ([facility]) at 69.8 mL/hr starting at 05/17 1800    Diet NPO effective now Except for: except ice chips, except sips of clears starting at 03/28 0959            Assessment/Plan:      1. Peritoneal abscess- Mixed flora, pigtail drain 06/26/15 . Vanco/Zosyn/Diflucan.           -Completed  6 week Abx on 08/12/15.      2. Enterocutaneous Fistula- wound care, bowel rest. Still has significant output.    3.  Severe intractable acute on chronic pain syndrome- receiving Dilaudid IV every 3 hr  Prn,Fentanyl patch decreased to 50  from 75  mcg. Started PO Oxycodone scheduled as she had better response with it previously. Added Gabapentin as she was on previously.         Consulted Psychiatry for depression, chronic pain and recommended SNRIs to help with pain and depression.She want to try therapy first.     4. Thrombocytosis. uncertain etiology- follow closely. Likely reactive.    5. Cachexia-   continue TPN, recheck PAB. Albumin stays less than 1.5.    6. Sinus tachycardia. Likely related to pain. Monitor.    7. Ischemic feet/arterial thromboemboli- The patient had a clot in her femoral artery right leg that was cleared by vascular surgery.  However the patient ended up with necrosis at  her distal digits which may be a combination of lack of blood flow and the use of pressors. Refusing heparin, aspirin and Plavix. Refused all heparin injections previously as well. Canceled aspirin but continue to encourage Plavix..           Following podiatrist at [facility].    8. History of colon cancer s/p exenteration with bilateral nephrostomy tubes    9. Recurrent presacral abscesses. Completed the course of Abx.    10. Debility- PT/OT    11. Guarded prognosis.     12. On TPN. 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 Cant tolerate PO. Weekly LFT while on TPN. TPN being managed by pharmacy. Electrolytes replacement through TPN. LFT are WNL so far.Mg being replaced through TPN.    13. D/C planning . Family meeting held on  5/3.  Pt need extensive wound care, TPN, IV Pain meds which are hurdle to discharge.    14. Nausea. Increased frequency of Zofran.    15. Right nephrostomy tube dislodged and leaking. New JP drain was placed on 5/16.    Recent labs reviewed.WBC trending up. Likely due to new JP drain placement. Pt  Is afebrile. Will monitoring CBC and fever curve.       Fentanyl decreased to .  Decreased the frequency of Dilaudid to Q 4 hr PRN.   Instruction to nursing staff.   If pt is asleep, wake her up to administer the oxycodone. ??   Goal is minimize the PRN Dilaudid and to give her baseline pain control.  ??   Dilaudid can be given for break through pain after 2 hr of scheduled Oxycodone. ??   If pt miss her scheduled Oxycodone, she will wake up in pain and will be asking for IV  Dilaudid all the time.                Prophylaxis;  GI Prophylaxis: Pepcid  DVT Prophylaxis: Lovenox     Code Status: Full Code      Sira Adsit MD.       08/29/2015

## 2015-08-30 LAB — POC GLU MONITORING DEVICE: POC Glucose Monitoring Device: 116 mg/dL (ref 70–100)

## 2015-08-30 MED ORDER — TPN ADULT (WCH/Drake)
3 | INTRAVENOUS | Status: AC
Start: 2015-08-30 — End: 2015-08-31
  Administered 2015-08-30: 22:00:00 via INTRAVENOUS

## 2015-08-30 MED FILL — HYDROMORPHONE 2 MG/ML INJECTION SYRINGE: 2 2 mg/mL | INTRAMUSCULAR | Qty: 2

## 2015-08-30 MED FILL — GABAPENTIN 250 MG/5 ML ORAL SOLUTION: 50 50 mg/mL | ORAL | Qty: 5

## 2015-08-30 MED FILL — TRAVASOL 10 % INTRAVENOUS SOLUTION: 10 10 % | INTRAVENOUS | Qty: 799

## 2015-08-30 MED FILL — OXYCODONE 20 MG/ML ORAL CONCENTRATE: 20 20 mg/mL | ORAL | Qty: 1

## 2015-08-30 MED FILL — FAMOTIDINE (PF) 20 MG/2 ML INTRAVENOUS SOLUTION: 20 20 mg/2 mL | INTRAVENOUS | Qty: 2

## 2015-08-30 MED FILL — LORAZEPAM 2 MG/ML INJECTION SOLUTION: 2 2 mg/mL | INTRAMUSCULAR | Qty: 1

## 2015-08-30 NOTE — Unmapped (Signed)
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 Presented to [facility] with abdominal distention and fevers.?? Identified as having pelvic and intraabdominal abscess - drained by IR?? - mixed enteric flora with no predominance.?? Readmitted to [facility] with bleeding for wound.?? Course complicated by ischemia to right foot..??     Subjective & Interval History;    Looking withdrawn. Chronically sick.  Pain is under better control today but concerned about pain management at night. States that sometime she dont get pain medication at time.  Denies any fever,chill, headache,N/V, CP ,SOB,Cough, Palpitation, diarrhea or constipation.Denies any joint pain, skin rash.  Right foot has dressing in place.  ES fistula has significant output.    Review of Systems:  10 point ROS was negative except as above.       PMH: Unchanged from H&P.   PSH: Unchanged from H&P.   Family medical history: Unchanged from H&P.   Social history: Unchanged from H&P      Scheduled Meds:  ??? clopidogrel  75 mg Per NG tube Daily 0900   ??? enoxaparin  40 mg Subcutaneous Daily 0900   ??? famotidine (PF)  20 mg Intravenous BID   ??? fentaNYL  1 patch Transdermal Q72H   ??? gabapentin  150 mg Oral 3 times per day   ??? insulin regular  0-5 Units Subcutaneous Daily 0900   ??? oxyCODONE  15 mg Oral 6 times per day     Continuous Infusions:  ??? TPN ADULT ([facility]) 69.8 mL/hr at 08/29/15 1841   ??? TPN ADULT ([facility])       PRN Meds:ALPRAZolam, alteplase, dextrose, dextrose 50 % in water (D50W), diclofenac sodium, heparin lock flush, HYDROmorphone, ipratropium-albuterol, LORazepam, ondansetron    Objective:    Vital signs in last 24 hours:     98.5 ??F (36.9 ??C)      ?? Heart Rate 114 115   ?? Resp 23 23   ?? BP: Systolic 93 mmHg 161 mmHg   ?? BP: Diastolic 59 mmHg 68 mmHg   ?? SpO2 100 % 100 %          Physical Examination:    GENERAL: The  patient is awake,not in acute cardiorespiratory distress. Looking chronically sick.   HEENT:Atraumatic.Normocephalic. PERRLA, EOMI. No icterus, no pallor, no jugular venous distention..No ear or nose discharge.  NECK: Supple. Trachea is in midline. No Thyromegaly  CHEST: Symmetric. Clear to auscultation, bilateral equal air entry. No wheezing or rhonchi.  CARDIOVASCULAR: S1, S2, Regular pulse. No murmur or gallops. No JVD.  ABDOMEN: Whole anterior abdomen is covered with pouch. Brownish fistula output noted.Bilateral nephrostomy tubes.  EXTREMITIES: There is + BLE edema . Very tender to touch both legs.  Right foot has dressing in place. Right toes are gangrenous.   Skin;No bruise,No rash.No petechiae.  CNS; CN II-XII grossly intact. No focal neurological deficit.       Intake/Output last 3 shifts:  I/O last 3 completed shifts:  In: 2854 [P.O.:1080; I.V.:1774]  Out: 1975 [Urine:975; Drains:1000]    Recent Labs;                           Lab name 08/22/15  0506 08/26/15  0521 08/29/15  0648   WBC 6.6 8.8 11.2*   HEMOGLOBIN 7.9*  8.3* 7.8*   HEMATOCRIT 25.4* 26.3* 25.5*   MEAN CORPUSCULAR VOLUME 94.3 91.5 91.7   PLATELETS 418* 533* 573*                                                            Lab name 08/22/15  0506 08/26/15  0521 08/29/15  0648   SODIUM 136 138 137   POTASSIUM 4.3 4.5 4.3   CREATININE 0.42* 0.50* 0.60   BUN 24 26* 33*   CHLORIDE 107 103 104   CO2 27 27 26    PHOSPHORUS 3.5 3.8 3.7   MAGNESIUM 1.9 1.7 2.0                         Lab name 08/05/15  0351 08/12/15  0521 08/19/15  0520 08/26/15  0521   AST 17 14 23 23    ALT 16 15 26 17    BILIRUBIN TOTAL 0.2 0.2 0.3 0.3       Lab name 07/05/15  0702  07/09/15  0544  08/22/15  0506 08/26/15  0521 08/29/15  0648   PREALBUMIN 14.0*  --  16.0*  --   --   --   --    ALBUMIN  --   < > 1.5*  < > <1.5* <1.5*   <1.5* <1.5*   < > = values in this interval not displayed.                                                                                                    Lab name 08/26/15  0521 08/29/15  0648   CALCIUM 8.0* 7.9*   PHOSPHORUS 3.8 3.7                              Lab name 08/19/15  0520 08/26/15  0521   TRIGLYCERIDES 115 96                        Lab name 08/05/15  0900 08/07/15  0850 08/10/15  0830   VANCOMYCIN TROUGH 9.3* 9.1* 17.4         CT abdomen 3/24  1. Multiple abscess, large pelvic abscess with surgical drain,  right issue rectal fossa.  2. 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  3. Granulation tissue enhancement or blood extravasation in the  lower abdominal wall as described  4. Proximity of the small bowel vasculature at the level of the  open wound on the right, side of active bleeding  5. Lower lobe bronchopneumonia and aspiration is also considered    Diet;    Diet Orders          TPN ADULT ([facility]) at 69.8 mL/hr starting at 05/19 1800    TPN ADULT ([facility])  at 69.8 mL/hr starting at 05/18 1800    Diet NPO effective now Except for: except ice chips, except sips of clears starting at 03/28 0959            Assessment/Plan:      1. Peritoneal abscess- Mixed flora, pigtail drain 06/26/15 . Vanco/Zosyn/Diflucan.           -Completed  6 week Abx on 08/12/15.      2. Enterocutaneous Fistula- wound care, bowel rest. Still has significant output.    3. Severe intractable acute on chronic pain syndrome- receiving Dilaudid IV every 3 hr  Prn,Fentanyl patch decreased to 50  from 75  mcg. Started PO Oxycodone scheduled as she had better response with it previously. Added Gabapentin as she was on previously.         Consulted Psychiatry for depression, chronic pain and recommended SNRIs to help with pain and depression.She want to try therapy first.     4. Thrombocytosis. uncertain etiology- follow closely. Likely reactive.    5. Cachexia-   continue TPN, recheck PAB. Albumin stays less than 1.5.    6. Sinus tachycardia. Likely related to pain. Monitor.    7. Ischemic feet/arterial thromboemboli-  The patient had a clot in her femoral artery right leg that was cleared by vascular surgery.  However the patient ended up with necrosis at her distal digits which may be a combination of lack of blood flow and the use of pressors. Refusing heparin, aspirin and Plavix. Refused all heparin injections previously as well. Canceled aspirin but continue to encourage Plavix..           Following podiatrist at [facility].    8. History of colon cancer s/p exenteration with bilateral nephrostomy tubes    9. Recurrent presacral abscesses. Completed the course of Abx.    10. Debility- PT/OT    11. Guarded prognosis.     12. On TPN. 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 Cant tolerate PO. Weekly LFT while on TPN. TPN being managed by pharmacy. Electrolytes replacement through TPN. LFT are WNL so far.Mg being replaced through TPN.    13. D/C planning . Family meeting held on  5/3.  Pt need extensive wound care, TPN, IV Pain meds which are hurdle to discharge.    14. Nausea. Increased frequency of Zofran.    15. Right nephrostomy tube dislodged and leaking. New JP drain was placed on 5/16.    Will continue current treatment plan.  Continue wound care. Pain control. TPN.         Prophylaxis;  GI Prophylaxis: Pepcid  DVT Prophylaxis: Lovenox     Code Status: Full Code      Treyvion Durkee MD.       08/30/2015

## 2015-08-30 NOTE — Unmapped (Signed)
Clinical Pharmacy Service - TPN Progress Note    Calie Buttrey is a 45 y.o. female followed by the pharmacy department for monitoring and adjustment of TPN    Assessment/Plan:   1) Continue continuous TPN with electrolytes, macronutrients and additives from yesterday  2) Routine labs scheduled every Monday and Thursday  3) Pharmacy will continue to monitor electrolytes and make necessary adjustments.      Subjective/Objective Information:    Recent Labs      08/29/15   0648   NA  137   CL  104   CO2  26   BUN  33*   CREATININE  0.60   K  4.3   PHOS  3.7   MG  2.0   CALCIUM  7.9*   ALBUMIN  <1.5*   GLUCOSE  102*     Recent Labs      08/28/15   0829  08/29/15   1017   POCGMD  136*  108*     Corrected Calcium = (0.8*(4mg /dL - Albumin))+Serum Ca     Thank you for the consult,  RACHEL COOK, PHARMD   08/30/2015 7:58 AM

## 2015-08-30 NOTE — Unmapped (Signed)
Physical Therapy   Reason Patient Not Seen         Name: Serenitie Vinton  DOB: April 29, 1970  Attending Physician: Valda Lamb, MD  Admission Diagnosis: jewish 07/05/15 dx: sepsis  Date: 08/30/2015    Reviewed Pertinent hospital course: Yes    Unable to see patient due to :Pain Pt initially declined PT student at 15:05 for LE therex and OOB stating she had just got finished completing OT. Pt stated she needed time to rest approximately 45 mins. PT student stated she would not be available in 45 mins to return for therapy. This PT entered pt room at 15:30 to follow up with pt to see if she wanted to do anything LE therex or get OOB. Pt states she is afraid of having pain in my legs. Explained to pt that she will have pain from stretching now or pain from future contracture from not moving but better to stretch at this time to prevent further contracture in legs. Pt re-directed multiple times during conversation to whether or not she wanted to participate in therapy at this time and explained again the difference in OT and PT. Explained to pt that if she was not able to participate next week that she would be discharged from PT as pt has made no progress and refuses therapy often. Plan to cont POC as tol.     Rulon Abide, PT, DPT [phone]

## 2015-08-30 NOTE — Unmapped (Signed)
Problem: Incontinence  Goal: Perineal skin integrity is maintained or improved  Assess genitourinary system, perineal skin, labs (urinalysis), and history of incontinence to include past management, aggravating, and alleviating factors. Collaborate with interdisciplinary team and initiate plans and interventions as needed.   Outcome: Progressing  Foley in place and perineal skin remains intact.

## 2015-08-30 NOTE — Unmapped (Signed)
Pt resting quietly in bed, no s/s noted of distress or discomfort. Pt continues to receive TPN at ordered rate without difficulty. PRN pain medications alternated per MD orders with scheduled pain medication throughout shift. Medication noted to be effective, although pt demonstrated drowsiness throughout shift. Pt refused to have majority of assessment completed, also refused wound care to be completed or specific drains/bags to emptied. No adverse events noted throughout shift. Bed in lowest position, call light within reach.

## 2015-08-30 NOTE — Unmapped (Signed)
PRN pain medication given multiple times over night.  Patient rested fairly well with no acute events noted.  Vital Signs stable and call light in reach.

## 2015-08-30 NOTE — Unmapped (Signed)
Occupational Therapy  Occupational Therapy Treatment     Name: Linda Ponce  DOB: 06/23/1970  Attending Physician: Valda Lamb, MD  Admission Diagnosis: jewish 07/05/15 dx: sepsis  Date: 08/30/2015      Reviewed Pertinent hospital course: Yes   Hospital Course PT/OT: Hospital Course PT/OT: 82f with history of colorectal CA, initially admitted to [facility] post operatively for continued wound care. Developed bleeding, sent back to [facility] where she was foundto have multiple pelvic abscesses, enterocutaneous fistulas, was kept NPO, started on IV abx, TPN, and returns for continued care. PRECAUTIONS: FULL CODE, AAT, NO WEIGHT BEARING RESTRICTIONS, ISCHEMIC R TOES, MULTIPLE DRAINS FROM ABDOMEN AND BACK, (updated 08/05/14-pt now NWB on B LEs x 2 weeks per Dr. Luan Pulling)       Assessment     Assessment: Decreased ADL status, Decreased self-care trans, Decreased Functional Mobility, Decreased activity tolerance  Prognosis: Guarded  Goal Formulation: Other (comment) (per therapist )    Pt scheduled for co-tx with PT.  Pt declining to move, crying at times during session.  Seen for UE washing of arms to: exfoliate, encourage cirulation and to de-sensitize pt to all forms of touch.  Pt began crying when OT lifted sheet off lower leg to see condition of LE skin.  Discussed pain of anticipation of moving.  Pt performed UE resisted exercises using therabands found on bedrails.  Denies additional pain with exercise, but unwilling to change position to allow R UE better ROM.  - PT arrived at end of session to encourage pt to participate in a second therapy session.      Goals  Pt Will demonstrate functional chair transfer: Moderate (via slliding board.)  Pt Will demonstrate grooming task: Stand by assist (at w/c level.)  Pt Will participate in upper extremity HEP to prep for ADLs: strengthening / activity tolerance for functional use: Ongoing   Miscellaneous Goal #1:  (Patient will tolerate sitting EOB with maximal assist by September 16, 2015)  Long Term Goal : Patient to be seen 2-3 week trial period to determine if she can participate and benefit from active OT programming - goal met, pt ability to participate is limited by anxiety and avoidance. New Goal: Pt to be Mod A with sliding board transfer  by September 16, 2015  Time frame for goals to be met in: By September 16, 2015    Recommendation  Plan  Treatment Interventions: Compensatory technique education, Therapeutic Activity, Patient/Family training, Excercise, ADL retraining, Functional transfer training, Activity Tolerance training, Energy Conservation  OT Frequency: 1-3x/wk      Problem List  Patient Active Problem List   Diagnosis   ??? Abdominal abscess   ??? Enterocutaneous fistula   ??? Malnutrition   ??? Fistula   ??? Adjustment disorder with mixed anxiety and depressed mood   ??? Pain of multiple sites   ??? Cognitive dysfunction-recent onset.        Past Medical History  Past Medical History   Diagnosis Date   ??? Cancer    ??? Abscess of abdominal cavity    ??? Gangrene of foot    ??? AKI (acute kidney injury)    ??? Fistula    ??? Peritonitis and retroperitoneal infections    ??? Cachexia    ??? Hyperkalemia    ??? Dehydration    ??? Encephalopathies    ??? Ischemic foot    ??? 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 Dilaudid IV q3h prn reduces it to 5/10 but does not last.  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 No hist        Past Surgical History  Past Surgical History   Procedure Laterality Date   ??? Colon surgery         Cognition  Overall Cognitive Status: Impaired (Pt is alert and oriented but is immobilized by pain and fear of pain. (cried out both spontaneously and when sheet covering foot was touched.)  )  Orientation Level: Oriented  X4    Pain  Pain Score:   9  Pain Location: Abdomen  Pain Descriptors: Aching;Discomfort  Pain Intervention(s): Medication (See eMAR)  Therapist reported pain to:: Reports pain meds don't necessarily work. (Pt called RN on phone and requested she stop by.)     Mobility  Bed Mobility  Rolling:  (Absolutely refused.)  Supine to Sit:  York Spaniel she might try Edge of bed, but not now...)      Exercises  Interventions  Shoulder AROM: Abduction;ER;IR;With T band;Supine (Horizontal add/abd, IR/ER with yellow theraband.)    ADL  Bathing Assistance: Contact guard assist (Pt was asked to wash her arms to address flaking skin, Refused to scrub. lightly washed and lotioned only.)  Additional Comments: Pt declining to scrub skin to remove peelling skin, educated on benefit of scrubbing, while pt acknowledged UE was not painful, she only wiped lightly.    Patient Education  Educated on the benefit of scrubbing skin for circulation, exfoliation, etc... Educated pt on benefits of UE exercises and need to acknowledge anxiety and fearful anticipation as limiting factors for her ability to do more. Will attempt further rapport building with pt next week.      Time  Start Time: 1432  Stop Time: 1459  Time Calculation (min): 27 min    Charges       $Therapeutic Exercise: 8-22 mins  $Self Care/ADL/Home Management Training: 8-22 mins

## 2015-08-30 NOTE — Unmapped (Addendum)
[facility] Inpatient Medical Psychology Service  [place] Department of Neurology & Rehabilitation Medicine  Division of Neuropsychology & Medical Psychology     INPATIENT MEDICAL PSYCHOLOGY PROGRESS NOTE    Patient Name:  Linda Ponce          DOB:  Jun 22, 1970  Admit Date:  07/08/2015   CSN:  [id number]    Referring Provider:  Valda Lamb, MD  Location:  [name] Room U981/XB147-82     Current Date: 08/30/2015  Time and Duration of Service: 9:30p-9:40p = 10 minutes      CPT Code:  95621 - Health & Behavior Intervention - Individual - 1 unit - 10 min.    Current Problem List:      Patient Active Problem List   Diagnosis   ??? Abdominal abscess   ??? Enterocutaneous fistula   ??? Malnutrition   ??? Fistula   ??? Adjustment disorder with mixed anxiety and depressed mood   ??? Pain of multiple sites   ??? Cognitive dysfunction-recent onset.       Current Medications:     Scheduled Medications:    ??? clopidogrel  75 mg Per NG tube Daily 0900   ??? enoxaparin  40 mg Subcutaneous Daily 0900   ??? famotidine (PF)  20 mg Intravenous BID   ??? fentaNYL  1 patch Transdermal Q72H   ??? gabapentin  150 mg Oral 3 times per day   ??? insulin regular  0-5 Units Subcutaneous Daily 0900   ??? oxyCODONE  15 mg Oral 6 times per day     PRN Medications:  ALPRAZolam, alteplase, dextrose, dextrose 50 % in water (D50W), diclofenac sodium, heparin lock flush, HYDROmorphone, ipratropium-albuterol, LORazepam, ondansetron      Objective Presentation/Subjective Concerns and Related Interventions:      ?? [name] presented as quite drowsy due to just having her pain medication so would not/could not talk much.   ?? Chart was reviewed, and reduction in fentanyl patch on 5/18 was noted.   ?? Pt reported that her oxycodone has been more or less on time today, so pain has been better controlled. She still is inordinately focused on dilaudid now being q4h prn (which it has been for over a week).   ?? She had not obtained her  I-Pad from her sister, and so has not made any progress picking a pain management relaxation APP or reading the Feeling Good chapter that I left her.    Psychiatric Diagnoses:      ?? Adjustment disorder with mixed anxiety and depressed mood.  ?? Related to prolonged hospitalization and inadequately controlled pain in abdomen, back, and legs.  ?? She has had NO prior treatment for anxiety or depression or substance use disorders.     ?? Pain of Multiple Sites.    ?? 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 Pt reports severe pain ranging from 8-10/10 even with fentanyl 125 mcg patch. Dilaudid IV q3h prn reduces it to 5/10 but does not last.  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 No history of narcotic abuse, but she has been on narcotics for abdominal and radiation pain from 2011 cancer surgery and complications x 6 yrs. Best regimen per pt post op at [facility] was oxycodone  15 mg q4h scheduled and dilaudid 1 mg q3h prn which she tapered down before discharge.She also took neurontin 300 mg TID when home prior to surgery for neuropathic pain (low back).  Psychosocial losses (death of father 1 week before surgery, missing teen son who is in [place] w/her mother) reduce ability to cope with pain and hospitalization.     ?? Cognitive dysfunction--recent onset.    ?? Likely residual of significant delirium at [facility], pain medications, distraction from pain itself, and possibly IV ABX. Will monotor. Primarily involves orientation to time, and somewhat to place, short term recall, and working memory. Expected to improve as pt's wounds and infections improve.  ?? MMSE-2 score of 21/20 (T=21) indicates moderate cognitive impairment involving orientation,  short term recall, and working memory.   ?? Clinical assessment of common sense reasoning skills indicated normal limits reasoning skills except when her pain gets severe, at which point she becomes fearful and suspicious about medications being withheld because she bother's the nurses too much.      INTERVENTIONS:    ?? She states sister is to bring her I-pad tomorrow.     ?? Instructed her to check out the APPs we discussed (she has screen shots of them) and read the chapter I left her. She agreed.      NURSING:     Please remind Dwight to call her sister and get her I-Pad so she can look at her pain management/relaxation APPs.      RECOMMENDATIONS -- DR Cloretta Ned:    ?? Improved timing of scheduled oxycodone will be critical for pain control with dose reduction in fentanyl. Timing of scheduled oxycodone has been problematic in the am and at night, because it comes due when work flow of med passes often causes it to be late given her location at the far end of the hall, resulting in increased pain and distress and ongoing fear of being in pain if meds are late.    ?? Please consider changing her scheduled oxycodone to 15 mg q4h6 resulting in administration times of 0600, 1000, 1400, 1800, 2200, and 0200 and so on which works better with med pass work flow timing.      ?? Appreciate increasing neurontin, but pt has declined it for reasons unknown (likely due to nausea given prior complaints).   ?? Please talk to her about it tomorrow; she likely refuses it due to nausea and it's bad taste (strong black licorice/anise flavor). She does not think to ask for her prn zofran before taking it.      Med Psychology Plan:    ?? Will see her earlier on Monday to address coping with pain and refusing neurontin.       Thank you for allowing me to participate in the care of your patient.    Cleave Ternes P. Minerva Ends, PhD  Licensed Insurance risk surveyor in Medical Psychology  [place] Department of Neurology & Rehabilitation  Medicine  Division of Neuropsychology & Medical Psychology  Office Phone for non-urgent messages/requests:  [phone]  Pager--urgent issues only: [phone]    08/30/2015

## 2015-08-30 NOTE — Unmapped (Signed)
Problem: Daily Care  Goal: Daily care needs are met  Assess and monitor ability to perform self care and identify potential discharge needs.   Pt assessed and monitored for ability to perform/complete self care activities and ADLs; assisted to ensure these needs are met and activities are completed. Encouraged to participate in daily care activities.   Intervention: Assess skin integrity/risk for skin breakdown and implement skin integrity plan of care and interventions per policy  Patient's educated on importance of maintaining skin integrity interventions such as turning/repositioning, pressure reduction techniques, monitoring for episodes of incontinence, and through use of basic skin care techniques keeping skin clean and dry. Pt resilient to allow for interventions to be implemented; reinforcement and education needed.   Intervention: Assist with ADLs as needed  Pt assisted with daily care activities/ self care to ensure these needs are met.   Intervention: Encourage independent activity per ability  Pt encouraged to complete self care activities independently per ability and to participate in aspects of care to promote independence and education. Pt demonstrates little interest regarding participation in such activities.

## 2015-08-31 LAB — CBC
Hematocrit: 24.8 % (ref 35.0–45.0)
Hemoglobin: 7.7 g/dL (ref 11.7–15.5)
MCH: 28.4 pg (ref 27.0–33.0)
MCHC: 31.2 g/dL (ref 32.0–36.0)
MCV: 90.9 fL (ref 80.0–100.0)
MPV: 6.9 fL (ref 7.5–11.5)
Platelets: 559 10*3/uL (ref 140–400)
RBC: 2.73 10*6/uL (ref 3.80–5.10)
RDW: 16.1 % (ref 11.0–15.0)
WBC: 7.4 10*3/uL (ref 3.8–10.8)

## 2015-08-31 LAB — POC GLU MONITORING DEVICE: POC Glucose Monitoring Device: 116 mg/dL (ref 70–100)

## 2015-08-31 MED ORDER — oxyCODONE (ROXICODONE INTENSOL) 20 mg/mL concentrated solution 15 mg
20 | ORAL | Status: AC
Start: 2015-08-31 — End: 2015-09-10
  Administered 2015-08-31 – 2015-09-07 (×33): 15 mg via ORAL
  Administered 2015-09-07: 18:00:00 20 mg via ORAL
  Administered 2015-09-07 – 2015-09-08 (×5): 15 mg via ORAL
  Administered 2015-09-08: 18:00:00 via ORAL
  Administered 2015-09-08 – 2015-09-10 (×11): 15 mg via ORAL

## 2015-08-31 MED ORDER — TPN ADULT (WCH/Drake)
3300 | INTRAVENOUS | Status: AC
Start: 2015-08-31 — End: 2015-09-01
  Administered 2015-08-31: 21:00:00 via INTRAVENOUS

## 2015-08-31 MED FILL — GABAPENTIN 250 MG/5 ML ORAL SOLUTION: 50 50 mg/mL | ORAL | Qty: 5

## 2015-08-31 MED FILL — ONDANSETRON HCL (PF) 4 MG/2 ML INJECTION SOLUTION: 4 4 mg/2 mL | INTRAMUSCULAR | Qty: 2

## 2015-08-31 MED FILL — OXYCODONE 20 MG/ML ORAL CONCENTRATE: 20 20 mg/mL | ORAL | Qty: 1

## 2015-08-31 MED FILL — HYDROMORPHONE 2 MG/ML INJECTION SYRINGE: 2 2 mg/mL | INTRAMUSCULAR | Qty: 2

## 2015-08-31 MED FILL — TRAVASOL 10 % INTRAVENOUS SOLUTION: 10 10 % | INTRAVENOUS | Qty: 799

## 2015-08-31 MED FILL — FAMOTIDINE (PF) 20 MG/2 ML INTRAVENOUS SOLUTION: 20 20 mg/2 mL | INTRAVENOUS | Qty: 2

## 2015-08-31 NOTE — Unmapped (Signed)
[id number]  Time spent in room patient leaking from abdominal fistulas  New pouches placed over large abdomen, new distal incision fistula spouting large amount of fluid and small pouch placed over it, new ostomy appliance applied.

## 2015-08-31 NOTE — Unmapped (Signed)
Patient called out for pain medication  Due for scheduled dose of pain medication and attempted to give and screaming at nurse that she wants dilaudid  Explained the schedule to her and she did not want to her it that she could not have dilaudid  Given scheduled dose after explanation

## 2015-08-31 NOTE — Unmapped (Signed)
Clinical Pharmacy Note - TPN Order    Linda Ponce is a 45 y.o. female that is being followed by the clinical pharmacy department for monitoring and adjustment of TPN.    Assessment/Plan:   1) Continue continuous TPN with electrolytes, macronutrients and additives from yesterday  2) Routine labs scheduled every Monday and Thursday  3) Pharmacy will continue to monitor electrolytes and make necessary adjustments.    Thank you.  Bonney Leitz, PharmD 08/31/2015 8:18 AM

## 2015-08-31 NOTE — Unmapped (Signed)
Problem: Fall Prevention  Goal: Patient will remain free of falls  Assess and monitor vitals signs, neurological status including level of consciousness and orientation. Reassess fall risk per hospital policy.    Ensure arm band on, uncluttered walking paths in room, adequate room lighting, call light and overbed table within reach, bed in low position, wheels locked, side rails up per policy, and non-skid footwear provided.   Outcome: Progressing  Patient's call light and overbed table within reach; room uncluttered; armband present; adequate lighting; bed in low position; HOB elevated; brakes presently on; visual checks frequently throughout the shift.

## 2015-08-31 NOTE — Unmapped (Signed)
Complains of nausea and upset stomach, no emesis  Medicated with zofran 4 mg IV

## 2015-08-31 NOTE — Unmapped (Signed)
Problem: Fall Prevention  Goal: Patient will remain free of falls  Assess and monitor vitals signs, neurological status including level of consciousness and orientation. Reassess fall risk per hospital policy.    Ensure arm band on, uncluttered walking paths in room, adequate room lighting, call light and overbed table within reach, bed in low position, wheels locked, side rails up per policy, and non-skid footwear provided.   Outcome: Progressing  Morse fall scale 55  Alert and oriented times 4  Calls out appropriately  Turned and repositioned as pt will allow  Does not get out of bed  No need for alarms

## 2015-08-31 NOTE — Unmapped (Signed)
Patient resting in bed; continues with TPN therapy; prn medication given per patient's request; effective; dressings and drains remain intact; call light in reach; visual checks during the shift.

## 2015-08-31 NOTE — Unmapped (Signed)
patient remains alert and oriented times 4  Only wants care on her time  Refuses all medication except pain medication  TPN infusing in right subclavian line without difficulty  Sipping on clear liquids

## 2015-08-31 NOTE — Unmapped (Signed)
Complains of nausea, no emesis  Medicated with zofran 4 mg IV

## 2015-08-31 NOTE — Unmapped (Signed)
Hospitalist Progress Note    08/31/2015     Jonee Nelligan   LOS: 54 days       HPI;    45 yr old female with underlying colorectal cancer with enteroanastomotic and enterocutaneous fistulas.?? Presented to [facility] with abdominal distention and fevers.?? Identified as having pelvic and intraabdominal abscess - drained by IR?? - mixed enteric flora with no predominance.?? Readmitted to [facility] with bleeding for wound.?? Course complicated by ischemia to right foot..??     Subjective & Interval History;    Dressing was leaking this AM. Was replaced by RN.  At the time of visit, She was looking comfortable. But start crying crying on seeing me.  Not cooperative in providing ROS.  Wants more pain medication.  Could not r/o pain medication seeking behavior.      Review of Systems:   as above.     Vital signs are stable .      PMH: Unchanged from H&P.   PSH: Unchanged from H&P.   Family medical history: Unchanged from H&P.   Social history: Unchanged from H&P      Scheduled Meds:  ??? clopidogrel  75 mg Per NG tube Daily 0900   ??? enoxaparin  40 mg Subcutaneous Daily 0900   ??? famotidine (PF)  20 mg Intravenous BID   ??? fentaNYL  1 patch Transdermal Q72H   ??? gabapentin  150 mg Oral 3 times per day   ??? insulin regular  0-5 Units Subcutaneous Daily 0900   ??? oxyCODONE  15 mg Oral 6 times per day     Continuous Infusions:  ??? TPN ADULT ([facility]) 69.8 mL/hr at 08/30/15 1800   ??? TPN ADULT ([facility])       PRN Meds:ALPRAZolam, alteplase, dextrose, dextrose 50 % in water (D50W), diclofenac sodium, heparin lock flush, HYDROmorphone, ipratropium-albuterol, LORazepam, ondansetron    Objective:    Vital signs in last 24 hours:  Temp:  [98 ??F (36.7 ??C)-98.8 ??F (37.1 ??C)] 98.8 ??F (37.1 ??C)  Heart Rate:  [80-123] 80  Resp:  [20-24] 20  BP: (100-115)/(63-68) 115/68 mmHg    Physical Examination:    GENERAL: The patient is awake,not in acute cardiorespiratory distress. Looking chronically sick.   HEENT:Atraumatic.Normocephalic. PERRLA, EOMI. No icterus,  no pallor, no jugular venous distention..No ear or nose discharge.  NECK: Supple. Trachea is in midline. No Thyromegaly  CHEST: Symmetric. Clear to auscultation, bilateral equal air entry. No wheezing or rhonchi.  CARDIOVASCULAR: S1, S2, Regular pulse. No murmur or gallops. No JVD.  ABDOMEN: Whole anterior abdomen is covered with pouch. Brownish fistula output noted.Bilateral nephrostomy tubes.  EXTREMITIES: There is + BLE edema . Very tender to touch both legs.  Right foot has dressing in place. Right toes are gangrenous.   Skin;No bruise,No rash.No petechiae.  CNS; CN II-XII grossly intact. No focal neurological deficit.       Intake/Output last 3 shifts:  I/O last 3 completed shifts:  In: 2368 [I.V.:1621]  Out: 1050 [Urine:1050]    Recent Labs;                           Lab name 08/26/15  0521 08/29/15  0648 08/31/15  0459   WBC 8.8 11.2* 7.4   HEMOGLOBIN 8.3* 7.8* 7.7*   HEMATOCRIT 26.3* 25.5* 24.8*   MEAN CORPUSCULAR VOLUME 91.5 91.7 90.9   PLATELETS 533* 573* 559*  Lab name 08/22/15  0506 08/26/15  0521 08/29/15  0648   SODIUM 136 138 137   POTASSIUM 4.3 4.5 4.3   CREATININE 0.42* 0.50* 0.60   BUN 24 26* 33*   CHLORIDE 107 103 104   CO2 27 27 26    PHOSPHORUS 3.5 3.8 3.7   MAGNESIUM 1.9 1.7 2.0                         Lab name 08/05/15  0351 08/12/15  0521 08/19/15  0520 08/26/15  0521   AST 17 14 23 23    ALT 16 15 26 17    BILIRUBIN TOTAL 0.2 0.2 0.3 0.3       Lab name 07/05/15  0702  07/09/15  0544  08/22/15  0506 08/26/15  0521 08/29/15  0648   PREALBUMIN 14.0*  --  16.0*  --   --   --   --    ALBUMIN  --   < > 1.5*  < > <1.5* <1.5*   <1.5* <1.5*   < > = values in this interval not displayed.                                                                                                   Lab name 08/26/15  0521 08/29/15  0648   CALCIUM 8.0* 7.9*   PHOSPHORUS 3.8 3.7                          Lab name 08/19/15  0520 08/26/15  0521   TRIGLYCERIDES 115 96                         Lab name 08/05/15  0900 08/07/15  0850 08/10/15  0830   VANCOMYCIN TROUGH 9.3* 9.1* 17.4       CT abdomen 3/24  1. Multiple abscess, large pelvic abscess with surgical drain,  right issue rectal fossa.  2. 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  3. Granulation tissue enhancement or blood extravasation in the  lower abdominal wall as described  4. Proximity of the small bowel vasculature at the level of the  open wound on the right, side of active bleeding  5. Lower lobe bronchopneumonia and aspiration is also considered    Diet;    Diet Orders          TPN ADULT ([facility]) at 69.8 mL/hr starting at 05/20 1800    TPN ADULT ([facility]) at 69.8 mL/hr starting at 05/19 1800    Diet NPO effective now Except for: except ice chips, except sips of clears starting at 03/28 0959            Assessment/Plan:      1. Peritoneal abscess- Mixed flora, pigtail drain 06/26/15 . Vanco/Zosyn/Diflucan.           -Completed  6 week Abx on 08/12/15.      2. Enterocutaneous Fistula- wound care, bowel rest. Still has significant output.  3. Severe intractable acute on chronic pain syndrome- receiving Dilaudid IV every 3 hr  Prn,Fentanyl patch decreased to 25  from 50  mcg. Started PO Oxycodone scheduled as she had better response with it previously. Added Gabapentin as she was on previously.         Consulted Psychiatry for depression, chronic pain and recommended SNRIs to help with pain and depression.She want to try therapy first.  Will gradually decrease the IV dilaudid dose.     4. Thrombocytosis. uncertain etiology- follow closely. Likely reactive.    5. Cachexia-   continue TPN, recheck PAB. Albumin stays less than 1.5.    6. Sinus tachycardia. Likely related to pain. Monitor.    7. Ischemic feet/arterial thromboemboli- The patient had a clot in her femoral artery right leg that was cleared by vascular surgery.  However the patient ended up with  necrosis at her distal digits which may be a combination of lack of blood flow and the use of pressors. Refusing heparin, aspirin and Plavix. Refused all heparin injections previously as well. Canceled aspirin but continue to encourage Plavix..           Following podiatrist at [facility].    8. History of colon cancer s/p exenteration with bilateral nephrostomy tubes    9. Recurrent presacral abscesses. Completed the course of Abx.    10. Debility- PT/OT    11. Guarded prognosis.     12. On TPN. 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 Cant tolerate PO. Weekly LFT while on TPN. TPN being managed by pharmacy. Electrolytes replacement through TPN. LFT are WNL so far.Mg being replaced through TPN.    13. D/C planning . Family meeting held on  5/3.  Pt need extensive wound care, TPN, IV Pain meds which are hurdle to discharge.    14. Nausea. Increased frequency of Zofran.    15.  New JP drain was placed on 5/16.    Will continue current treatment plan.  Continue wound care. Pain control. TPN.  Repeat CBC show improvement in WBC.  D/w her to start anti depressant. She is not willing to take any other medication except her pain medication.  She is refusing to take her scheduled pain  Medication at times. Psychology notes reviewed and recommendations appreciated.      Prophylaxis;  GI Prophylaxis: Pepcid  DVT Prophylaxis: Lovenox     Code Status: Full Code      Quanetta Truss MD.       08/31/2015

## 2015-09-01 LAB — POC GLU MONITORING DEVICE: POC Glucose Monitoring Device: 120 mg/dL (ref 70–100)

## 2015-09-01 MED ORDER — metoprolol tartrate (LOPRESSOR) partial tablet 12.5 mg
12.5 | Freq: Two times a day (BID) | ORAL | Status: AC
Start: 2015-09-01 — End: 2015-09-04
  Administered 2015-09-03: 01:00:00 12.5 mg via ORAL

## 2015-09-01 MED ORDER — TPN ADULT (WCH/Drake)
10 | INTRAVENOUS | Status: AC
Start: 2015-09-01 — End: 2015-09-02
  Administered 2015-09-01: 22:00:00 via INTRAVENOUS

## 2015-09-01 MED FILL — HYDROMORPHONE 2 MG/ML INJECTION SYRINGE: 2 2 mg/mL | INTRAMUSCULAR | Qty: 2

## 2015-09-01 MED FILL — TRAVASOL 10 % INTRAVENOUS SOLUTION: 10 10 % | INTRAVENOUS | Qty: 799

## 2015-09-01 MED FILL — OXYCODONE 20 MG/ML ORAL CONCENTRATE: 20 20 mg/mL | ORAL | Qty: 1

## 2015-09-01 MED FILL — FAMOTIDINE (PF) 20 MG/2 ML INTRAVENOUS SOLUTION: 20 20 mg/2 mL | INTRAVENOUS | Qty: 2

## 2015-09-01 MED FILL — FENTANYL 25 MCG/HR TRANSDERMAL PATCH: 25 25 mcg/hr | TRANSDERMAL | Qty: 1

## 2015-09-01 MED FILL — GABAPENTIN 250 MG/5 ML ORAL SOLUTION: 50 50 mg/mL | ORAL | Qty: 5

## 2015-09-01 MED FILL — ONDANSETRON HCL (PF) 4 MG/2 ML INJECTION SOLUTION: 4 4 mg/2 mL | INTRAMUSCULAR | Qty: 2

## 2015-09-01 MED FILL — CATHFLO ACTIVASE 2 MG INTRA-CATHETER SOLUTION: 2 2 mg | Qty: 2

## 2015-09-01 NOTE — Unmapped (Signed)
Patient resting in bed; tolerating tube feeding; prn medication given per patient's requested; effective; patient is resistive to dressing change; upset because drainage leaking from the JP drain site; after much coaching with the patient; patient declined dressing to be changed; but allowed nurse to apply gauze around the area after patient cleansed area per self; patient was repositioned per patient's specifications; patient has declined nurse to check the site throughout the shift; patient very tearful; angry; agitated about her condition; staff assisted patient with care over an hour; time limits were set; explained rationale to patient of providing care to her and other patient's on the unit in a timely manner;?? Patient states she understand information being given; reinforcement is needed on this subject; call light in reach; visual checks frequent; continues to tolerate TPN therapy.

## 2015-09-01 NOTE — Unmapped (Signed)
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 Presented to [facility] with abdominal distention and fevers.?? Identified as having pelvic and intraabdominal abscess - drained by IR?? - mixed enteric flora with no predominance.?? Readmitted to [facility] with bleeding for wound.?? Course complicated by ischemia to right foot..??     Subjective & Interval History;    Pt examined at the bedside.  Looking withdrawn. States that Pain is unde OK control.  As per RN, new fistula were found today.    Denies any fever,chill, headache,N/V, CP ,SOB,Cough, Palpitation, diarrhea or constipation.Denies any joint pain,.      Review of Systems:   as above.     Vital signs are stable .      PMH: Unchanged from H&P.   PSH: Unchanged from H&P.   Family medical history: Unchanged from H&P.   Social history: Unchanged from H&P      Scheduled Meds:  ??? clopidogrel  75 mg Per NG tube Daily 0900   ??? enoxaparin  40 mg Subcutaneous Daily 0900   ??? famotidine (PF)  20 mg Intravenous BID   ??? fentaNYL  1 patch Transdermal Q72H   ??? gabapentin  150 mg Oral 3 times per day   ??? insulin regular  0-5 Units Subcutaneous Daily 0900   ??? metoprolol tartrate  12.5 mg Oral BID   ??? oxyCODONE  15 mg Oral Q4H2 SCH     Continuous Infusions:  ??? TPN ADULT ([facility]) 69.8 mL/hr at 08/31/15 1723     PRN Meds:ALPRAZolam, alteplase, dextrose, dextrose 50 % in water (D50W), diclofenac sodium, heparin lock flush, HYDROmorphone, ipratropium-albuterol, LORazepam, ondansetron    Objective:    Vital signs in last 24 hours:  Temp:  [98.3 ??F (36.8 ??C)-99 ??F (37.2 ??C)] 98.3 ??F (36.8 ??C)  Heart Rate:  [80-134] 134  Resp:  [18-20] 18  BP: (103-122)/(55-72) 105/55 mmHg    Physical Examination:    GENERAL: The patient is awake,not in acute cardiorespiratory distress. Looking chronically sick.   HEENT:Atraumatic.Normocephalic. PERRLA, EOMI. No icterus,  no pallor, no jugular venous distention..No ear or nose discharge.  NECK: Supple. Trachea is in midline. No Thyromegaly  CHEST: Symmetric. Clear to auscultation, bilateral equal air entry. No wheezing or rhonchi.  CARDIOVASCULAR: S1, S2, Regular pulse. No murmur or gallops. No JVD.  ABDOMEN: Whole anterior abdomen is covered with pouch. Brownish fistula output noted.Bilateral nephrostomy tubes.  EXTREMITIES: There is + BLE edema . Very tender to touch both legs.  Right foot has dressing in place. Right toes are gangrenous.   Skin;No bruise,No rash.No petechiae.  CNS; CN II-XII grossly intact. No focal neurological deficit.       Intake/Output last 3 shifts:  I/O last 3 completed shifts:  In: 3012 [IV Piggyback:20]  Out: 400 [Urine:400]    Recent Labs;                           Lab name 08/26/15  0521 08/29/15  0648 08/31/15  0459   WBC 8.8 11.2* 7.4   HEMOGLOBIN 8.3* 7.8* 7.7*   HEMATOCRIT 26.3* 25.5* 24.8*   MEAN CORPUSCULAR VOLUME 91.5 91.7 90.9   PLATELETS 533* 573* 559*  Lab name 08/22/15  0506 08/26/15  0521 08/29/15  0648   SODIUM 136 138 137   POTASSIUM 4.3 4.5 4.3   CREATININE 0.42* 0.50* 0.60   BUN 24 26* 33*   CHLORIDE 107 103 104   CO2 27 27 26    PHOSPHORUS 3.5 3.8 3.7   MAGNESIUM 1.9 1.7 2.0                         Lab name 08/05/15  0351 08/12/15  0521 08/19/15  0520 08/26/15  0521   AST 17 14 23 23    ALT 16 15 26 17    BILIRUBIN TOTAL 0.2 0.2 0.3 0.3       Lab name 07/05/15  0702  07/09/15  0544  08/22/15  0506 08/26/15  0521 08/29/15  0648   PREALBUMIN 14.0*  --  16.0*  --   --   --   --    ALBUMIN  --   < > 1.5*  < > <1.5* <1.5*   <1.5* <1.5*   < > = values in this interval not displayed.                                                                                                   Lab name 08/26/15  0521 08/29/15  0648   CALCIUM 8.0* 7.9*   PHOSPHORUS 3.8 3.7                          Lab name 08/19/15  0520 08/26/15  0521   TRIGLYCERIDES  115 96                        Lab name 08/05/15  0900 08/07/15  0850 08/10/15  0830   VANCOMYCIN TROUGH 9.3* 9.1* 17.4       CT abdomen 3/24  1. Multiple abscess, large pelvic abscess with surgical drain,  right issue rectal fossa.  2. 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  3. Granulation tissue enhancement or blood extravasation in the  lower abdominal wall as described  4. Proximity of the small bowel vasculature at the level of the  open wound on the right, side of active bleeding  5. Lower lobe bronchopneumonia and aspiration is also considered    Diet;    Diet Orders          TPN ADULT ([facility]) at 69.8 mL/hr starting at 05/20 1800    Diet NPO effective now Except for: except ice chips, except sips of clears starting at 03/28 0959            Assessment/Plan:      1. Peritoneal abscess- Mixed flora, pigtail drain 06/26/15 . Vanco/Zosyn/Diflucan.           -Completed  6 week Abx on 08/12/15.      2. Enterocutaneous Fistula- wound care, bowel rest. Still has significant output.    3. Severe intractable acute on chronic pain syndrome- receiving Dilaudid  IV every 3 hr  Prn,Fentanyl patch decreased to 25  from 50  mcg. Started PO Oxycodone scheduled as she had better response with it previously. Added Gabapentin as she was on previously.         Consulted Psychiatry for depression, chronic pain and recommended SNRIs to help with pain and depression.She want to try therapy first.  Will gradually decrease the IV dilaudid dose.     4. Thrombocytosis. uncertain etiology- follow closely. Likely reactive.    5. Cachexia-   continue TPN, recheck PAB. Albumin stays less than 1.5.    6. Sinus tachycardia. Likely related to pain. Monitor.    7. Ischemic feet/arterial thromboemboli- The patient had a clot in her femoral artery right leg that was cleared by vascular surgery.  However the patient ended up with necrosis at her distal digits which may be a combination of  lack of blood flow and the use of pressors. Refusing heparin, aspirin and Plavix. Refused all heparin injections previously as well. Canceled aspirin but continue to encourage Plavix..           Following podiatrist at [facility].    8. History of colon cancer s/p exenteration with bilateral nephrostomy tubes    9. Recurrent presacral abscesses. Completed the course of Abx.    10. Debility- PT/OT    11. Guarded prognosis.     12. On TPN. 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 Cant tolerate PO. Weekly LFT while on TPN. TPN being managed by pharmacy. Electrolytes replacement through TPN. LFT are WNL so far.Mg being replaced through TPN.    13. D/C planning . Family meeting held on  5/3.  Pt need extensive wound care, TPN, IV Pain meds which are hurdle to discharge.    14. Nausea. Increased frequency of Zofran.    15.  New JP drain was placed on 5/16.    Will continue current treatment plan.  F/u CBC,BMP tomorrow. Ordered.  F/u Wound care. F/u IR as scheduled.       Prophylaxis;  GI Prophylaxis: Pepcid  DVT Prophylaxis: Lovenox     Code Status: Full Code      Tramayne Sebesta MD.       09/01/2015

## 2015-09-01 NOTE — Unmapped (Signed)
Clinical Pharmacy Note (TPN Dosing)    No labs to review today.      Reviewed chart for any dietary recommendations and other problems.    No changes to TPN    Linda Ponce  09/01/2015  8:20 AM

## 2015-09-01 NOTE — Unmapped (Signed)
Assessment completed, see doc flow sheet, in bed resting, call light within reach, without distress, denies needs at present time, will continue to monitor and treat. LYNNE BLACKWELL-WILLIAMS

## 2015-09-01 NOTE — Unmapped (Signed)
Problem: Compromised Skin Integrity  Goal: STG - Patient will maintain good skin integrity  Outcome: Progressing  Patient is resistive to dressing change; upset because drainage leaking from the JP drain site; cleansed area after much coaching with the patient; patient declined dressing to be changed; but allowed nurse to apply gauze around the area after patient cleansed area per self; patient was repositioned per patient's specifications; patient has declined nurse to check the site throughout the shift; patient very tearful; angry; agitated about her condition; staff assisted patient over an hour; time limits were set; explained rationale to patient of providing care to her and other patient's on the unit in a timely manner;  Patient states she understand information being given; reinforcement is needed.

## 2015-09-01 NOTE — Unmapped (Deleted)
Assessment completed, see doc flow sheet, in bed resting, call light within reach, without distress, denies needs at present time, will continue to monitor and treat. .me

## 2015-09-02 LAB — CBC
Hematocrit: 23.8 % (ref 35.0–45.0)
Hemoglobin: 7.6 g/dL (ref 11.7–15.5)
MCH: 28.8 pg (ref 27.0–33.0)
MCHC: 32.1 g/dL (ref 32.0–36.0)
MCV: 89.9 fL (ref 80.0–100.0)
MPV: 6.7 fL (ref 7.5–11.5)
Platelets: 575 10*3/uL (ref 140–400)
RBC: 2.65 10*6/uL (ref 3.80–5.10)
RDW: 16.3 % (ref 11.0–15.0)
WBC: 8.7 10*3/uL (ref 3.8–10.8)

## 2015-09-02 LAB — RENAL FUNCTION PANEL W/EGFR
Albumin: <1.5 g/dL (ref 3.5–5.7)
Anion Gap: 5 mmol/L (ref 3–16)
BUN: 30 mg/dL (ref 7–25)
CO2: 28 mmol/L (ref 21–33)
Calcium: 7.9 mg/dL (ref 8.6–10.3)
Chloride: 104 mmol/L (ref 98–110)
Creatinine: 0.54 mg/dL (ref 0.60–1.30)
Glucose: 109 mg/dL (ref 70–100)
Osmolality, Calculated: 291 mOsm/kg (ref 278–305)
Phosphorus: 4.1 mg/dL (ref 2.1–4.7)
Potassium: 4.5 mmol/L (ref 3.5–5.3)
Sodium: 137 mmol/L (ref 133–146)
eGFR AA CKD-EPI: >90 See note.
eGFR NONAA CKD-EPI: >90 See note.

## 2015-09-02 LAB — MAGNESIUM: Magnesium: 2 mg/dL (ref 1.5–2.5)

## 2015-09-02 LAB — HEPATIC FUNCTION PANEL
ALT: 17 U/L (ref 7–52)
AST: 14 U/L (ref 13–39)
Albumin: <1.5 g/dL (ref 3.5–5.7)
Alkaline Phosphatase: 187 U/L (ref 36–125)
Bilirubin, Direct: 0.1 mg/dL (ref 0.00–0.40)
Bilirubin, Indirect: 0.1 mg/dL (ref 0.00–1.10)
Total Bilirubin: 0.2 mg/dL (ref 0.0–1.5)
Total Protein: 5.3 g/dL (ref 6.4–8.9)

## 2015-09-02 LAB — TRIGLYCERIDES: Triglycerides: 89 mg/dL (ref 10–149)

## 2015-09-02 MED ORDER — TPN ADULT (WCH/Drake)
500 | INTRAMUSCULAR | Status: AC
Start: 2015-09-02 — End: 2015-09-03
  Administered 2015-09-02: 21:00:00 via INTRAVENOUS

## 2015-09-02 MED ORDER — HYDROmorphone (DILAUDID) injection Syrg 3 mg
2 | INTRAMUSCULAR | Status: AC | PRN
Start: 2015-09-02 — End: 2015-09-06
  Administered 2015-09-02 – 2015-09-06 (×20): 3 mg via INTRAVENOUS

## 2015-09-02 MED FILL — OXYCODONE 20 MG/ML ORAL CONCENTRATE: 20 20 mg/mL | ORAL | Qty: 1

## 2015-09-02 MED FILL — LORAZEPAM 2 MG/ML INJECTION SOLUTION: 2 2 mg/mL | INTRAMUSCULAR | Qty: 1

## 2015-09-02 MED FILL — METOPROLOL TARTRATE 12.5 MG ORAL DOSE: 12.5 12.5 MG | ORAL | Qty: 1

## 2015-09-02 MED FILL — GABAPENTIN 250 MG/5 ML ORAL SOLUTION: 50 50 mg/mL | ORAL | Qty: 5

## 2015-09-02 MED FILL — HYDROMORPHONE 2 MG/ML INJECTION SYRINGE: 2 2 mg/mL | INTRAMUSCULAR | Qty: 2

## 2015-09-02 MED FILL — FAMOTIDINE (PF) 20 MG/2 ML INTRAVENOUS SOLUTION: 20 20 mg/2 mL | INTRAVENOUS | Qty: 2

## 2015-09-02 MED FILL — CLOPIDOGREL 75 MG TABLET: 75 75 mg | ORAL | Qty: 1

## 2015-09-02 MED FILL — TRAVASOL 10 % INTRAVENOUS SOLUTION: 10 10 % | INTRAVENOUS | Qty: 799

## 2015-09-02 MED FILL — ONDANSETRON HCL (PF) 4 MG/2 ML INJECTION SOLUTION: 4 4 mg/2 mL | INTRAMUSCULAR | Qty: 2

## 2015-09-02 MED FILL — ENOXAPARIN 40 MG/0.4 ML SUBCUTANEOUS SYRINGE: 40 40 mg/0.4 mL | SUBCUTANEOUS | Qty: 0.4

## 2015-09-02 NOTE — Unmapped (Signed)
New fistula area below the present abdominal fistula; attempted to put a small eakins pouch; ineffective due to continuous amount of drainage from site; cleansed area and applied a clean gauze dressing; will leave a message with the m=wound team.

## 2015-09-02 NOTE — Unmapped (Signed)
[place]    [facility]   Wound Care Assessment  09/02/2015       Linda Ponce is an 45 y.o. female.  DOB: 1970-09-18    Pressure ulcers on admission:  One STAGE: III on coccyx              One UNSTAGEABLE on right lateral hip    Diagnosis: jewish 07/05/15 dx: sepsis    Allergies   Allergen Reactions   ??? Compazine [Prochlorperazine Edisylate]    ??? Latex, Natural Rubber        History:    Past Medical History   Diagnosis Date   ??? Cancer    ??? Abscess of abdominal cavity    ??? Gangrene of foot    ??? AKI (acute kidney injury)    ??? Fistula    ??? Peritonitis and retroperitoneal infections    ??? Cachexia    ??? Hyperkalemia    ??? Dehydration    ??? Encephalopathies    ??? Ischemic foot    ??? 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 Dilaudid IV q3h prn reduces it to 5/10 but does not last.  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 No hist     Past Surgical History   Procedure Laterality Date   ??? Colon surgery       Social History   Substance Use Topics   ??? Smoking status: Never Smoker    ??? Smokeless tobacco: None   ??? Alcohol Use: None     Present admission history of illness/MD note.  74f with history of colorectal CA, initially admitted to [facility] post operatively for continued wound care. Developed bleeding, sent back to [facility] where she was foundto have multiple pelvic abscesses, enterocutaneous fistulas, was kept NPO, started on IV abx, TPN, and returns for continued care.    Skin/Wound Care Assessment.      Pt very agitated during wound care assessment. Appeared confused, stating things such as Who called you guys here and I did not call the soldiers in. Pt irritated with staff and constantly smacking and grabbing wound care nurses' hands  during assessment. Explained to pt why wound care was present, as well as importance of wound care. Pt continued to be agitated, yelling at staff throughout assessment. [name], pt's RN, made aware. Pt refused to allow wound care to complete full assessment.       Braden: 16     Pressure wounds:    Coccyx STAGE:???? III??   Wounds progress: RESOLVED (08/20/15)    _____________________________________________________________________  Surgical wounds:????????????????????????????????????????????????????????????  Mid Abdominal Wound with fistula???? Full Thickness. Proximal Measurements: 6 cm L x 2.8 cm W x  D (pt did not allow depth to be taken)  Distal Measurements: 3 cm L x 2 cm W x  D (pt did not allow depth to be taken)  Wounds progress: Decline-development of new fistula.  Wound Assessment:?? Wound bed with moist bright pink tissue, granulation tissue with areas of mucosal/bowel tissue.  New area opened inferior to mid abdominal fistula Area is moist, pink tissue draining large amounts of light brown effluent. Well defined edges, with some epithelial tissue noted at edges. Overall area decreasing in size. Large amount liquid gold brown effluent draining into foley bag. Periwound skin with increased erythema to right peri-stomal skin, blanchable.   Treatment/Dressing: Mid abdominal and new inferior fistula being managed with Eakins pouches attached to gravity drainage bags.  Clean wounds and surrounding skin with soap and water, pat dry. Attach Eakins pouch using Eakins rings to help with adherence. Also apply Eakins rings on small area of intact skin in between mid abdominal and lower mid abdominal fistula. Use pouch [id number] for mid abdomen and new lower mid abdomen fistula and smaller pouch [id number] for left abdomen. Change pouches weekly and prn for leakages.       ____________________________________________________________________  Surgical wounds:??????  ????Left  Lateral Abdomen Fistula???? Full Thickness. Measurements L 3?? cm X W 0.5 cm X?? D 0.3 cm  (from previous assessment)  Wounds progress: UTA-Pt would not allow wound team to remove pouch  Wound Assessment:??UTA  Treatment/Dressing:?? Mid abdominal and left abdominal fistula/wounds being managed with Eakins pouches attached to gravity drainage bags. Clean wounds and surrounding skin with soap and water, pat dry. Attach Eakins pouch using Eakins rings to help with adherence. Use pouch [id number] for mid abdomen and smaller pouch [phone] for left abdomen. Change pouches weekly and prn for leakages.      ____________________________________________________________________  Surgical wounds:????  Drain of left groin ?? ??  Wounds progress: No change  Wound Assessment: JP drain to site, not sutured in place but has securing device, small amount of reddish-brown drainage in bulb, no drainage noted to be leaking around insertion site, peri-wound with erythema  Treatment/Dressing: Keep proximal end of drain secured at all times using foley statlock; also tape slack of tubing to leg to prevent pulling. To manage drainage around insertion site: cleanse skin with saline, pat dry, apply Cavilon No Sting spray. Use small Eakin's pouch [id number]. Cut pouch to fit around drain tubing and cut opening in anterior window of pouch, disconnect tubing from JP bulb and statlock but hold tubing to keep secure and prevent pulling out. Feed tubing through pouch from back to front, adhere pouch to skin, resecure tube with statlock and reattach to compressed JP bulb, re-tape tubing to leg, use hy tape to seal opening on anterior portion of pouch. Change weekly and prn. Assess pouch frequently for needing emptied or may connect to foley bag.       _____________________________________________________________________  Surgical wounds:??????  Bilateral nephrostomy drains,??   Wounds progress:??UTA-pt refused assessment   Wound Assessment: UTA  Treatment/Dressing: Both  to drainage pouches. Dressing changes every 3 days and prn for dressing failure. New dressing order: after clean with CHG, apply a slit Mepilex border, securing around tubing to make it occlusive, then apply another Mepilex border to insure it is occlusive and to position tubing so that patient is not lying on tubing.    *Due to MASD resultant from fistula leakage, used crusting technique to peri-wound, placed layered split gauze dressing and secured with medipore tape. Pt did not want tegaderm or mepilex due to skin irritation.  Pictures below from last week's assessment     ____________________________________________________________________  Vascular Wounds on Right Foot????     Wounds progress:??UTA-pt refused assessment  Wound Assessment:??UTA  Treatment/Dressing: Per Dr. Luan Pulling (pt had appt on 4/25) paint toes daily with betadine, keep dry and keep pressure off toes. Also per appt notes, place 2 layers of tubi-grip to left lower leg from toes to knees - explained this to pt but she refused to allow this nurse to apply.   *Covered with dry rolled gauze after betadine, leaving ends of toes exposed, per pt request due to pt stating she can't stand when the sheets touch her bare foot.      First two images below are from last week's assessment      _____________________________________________________________________  Surgical wounds:  Colostomy/mucsous fistula ????RLQ    Wound Progress:  No change, stable   Wound Assessment:??Moist red budded stoma, peri-wound intact but with increased erythema superior to stoma and to the right of stoma (from fistula leakage). Scant amount of mucous drainage from ostomy, and scant amount thin liquid brown drainage in pouch   Wound Treatment: Right lower quadrant colostomy currently only functioning as mucous fistula. Keep covered with small Myna Bright [phone]. Change weekly and prn dressing failure.        ______________________________________________________________  Right  lateral thigh   Stage II  Measures: L 1.5 cm x W 1.5 cm x D 0.01 cm  Wounds progress:?? No significant change  Wound Assessment:?? partial thickness, moist pink tissue, well-defined attached edges, no drainage  Wound treatment: cleanse with saline, pat dry, cover with mepilex border, change every 3 days and prn.    No photo taken. Photo below from last week's assessment      ______________________________________________________________    Treatment time: approximately 2hr with 2 RNs  ______________________________________________________________  Preventative measures.    Off loading and pressure relief as measures are using an accumax mattress, Turning Wedge, pillows, and waffle boots to aid in off load of pressure points.  Other measures include lotion's and cream's to Hydrate and moisturize dry skin.  Moisture barrier cream [employer] clear is provided to protect groin and peri anal area.. Currently has foley catheter with statlock in place.         Labs:   Lab Results   Component Value Date    GLUCOSE 109* 09/02/2015    BUN 30* 09/02/2015    CO2 28 09/02/2015    CREATININE 0.54* 09/02/2015    K 4.5 09/02/2015    NA 137 09/02/2015    CL 104 09/02/2015    CALCIUM 7.9* 09/02/2015     Lab Results   Component Value Date    WBC 8.7 09/02/2015    HGB 7.6* 09/02/2015    HCT 23.8* 09/02/2015    MCV 89.9 09/02/2015    PLT 575* 09/02/2015     No results found for: PTT, INR  Lab Results   Component Value Date    PREALBUMIN 16.0* 07/09/2015     Lab Results   Component Value Date    ALBUMIN <1.5* 09/02/2015    ALBUMIN <1.5* 09/02/2015     Lab Results   Component Value Date    ESR 15 08/04/2015    CRP 96.9* 08/04/2015

## 2015-09-02 NOTE — Unmapped (Signed)
??   Attempted to see Keane Police at 7:40p; she declined due to severe pain. She agreed that I could return later, but when I returned, she was too sedated by her pain medications to stay awake. I have not been able to work with her for a week due to this scenario.     Med Psychology Plan:     ?? I am OOF at a conference tomorrow, so I will attempt to see her on Wednesday before her family conference at 1:30p    Johnthomas Lader P. Minerva Ends, PhD  Licensed Insurance risk surveyor in Medical Psychology  [facility]  Division of Neuropsychology & Medical Psychology  Office Phone for non-urgent messages/requests:  [phone]  Pager--urgent issues only: [phone]    09/02/2015

## 2015-09-02 NOTE — Unmapped (Signed)
Patient allowed staff to provide care this morning at 0430 am; encourage patient to participate but complete task in a timely manner; patient tolerated procedure quite well; dressing at the end of shift remains dry and intact; prn medication given per patient request; effective; repositioned in bed; call light in reach.

## 2015-09-02 NOTE — Unmapped (Signed)
Problem: Compromised Skin Integrity  Goal: LTG - Patient will maintain/improve skin integrity through proper skin care techniques  Off loading and pressure relief measures using an AccuMax mattress, turning wedge, pillows, and waffle boots to aid in off-loading of pressure points.  Other measures include lotions and creams to hydrate and moisturize dry skin.  Moisture barrier cream [employer]icaid clear is provided to protect groin and peri anal area from incontinence.

## 2015-09-02 NOTE — Unmapped (Signed)
LM for sister to schedule care conference.

## 2015-09-02 NOTE — Unmapped (Signed)
Clinical Pharmacy Note - TPN Order    Linda Ponce is a 45 y.o. female that is being followed by the clinical pharmacy department for monitoring and adjustment of TPN.    Patient remains NPO with ice chips, sips of clear liquids.    Plan:    -Adjusted Calcium 9.9; WNL  -Alkaline phosphatase elevated to 187 but improved from previous value, 200 on 5/15  -triglycerides 89  -Magnesium 2.0-no adjustment necessary  -BS WNL-tested only daily now  -continue current continuous TPN-no changes  -formulation listed below    TPN Electrolytes   Sodium Chloride: 80 mEq   Sodium Acetate: 0 mEq   Sodium Phosphate: 0 mmol   Magnesium Sulfate: 20 mEq   Calcium Gluconate: 9.6 mEq   Potassium Chloride: 20 mEq mEq   Potassium Phosphate:18 mmol TPN Macronutrients   Volume: 1675 mL   Rate: 69.8 mL/hr   Amino Acids: 79.9 grams/day (47.7 g/L)    Dextrose: 293.3 grams/day (175 g/L)    Lipids: 35.19 grams/day  (21 g/L)  TPN Additives   MVI 10 mL   TE 1 mL   Ascorbic acid 200 mg                  SODIUM   Date Value Ref Range Status   09/02/2015 137 133 - 146 mmol/L Final     CHLORIDE   Date Value Ref Range Status   09/02/2015 104 98 - 110 mmol/L Final     CO2   Date Value Ref Range Status   09/02/2015 28 21 - 33 mmol/L Final     POTASSIUM   Date Value Ref Range Status   09/02/2015 4.5 3.5 - 5.3 mmol/L Final     PHOSPHORUS   Date Value Ref Range Status   09/02/2015 4.1 2.1 - 4.7 mg/dL Final     CALCIUM   Date Value Ref Range Status   09/02/2015 7.9* 8.6 - 10.3 mg/dL Final     GLUCOSE   Date Value Ref Range Status   09/02/2015 109* 70 - 100 mg/dL Final       Ryder Man E Jakeline Dave [facility] 09/02/2015 8:38 AM

## 2015-09-02 NOTE — Unmapped (Signed)
Problem: Fall Prevention  Goal: Patient will remain free of falls  Assess and monitor vitals signs, neurological status including level of consciousness and orientation. Reassess fall risk per hospital policy.    Ensure arm band on, uncluttered walking paths in room, adequate room lighting, call light and overbed table within reach, bed in low position, wheels locked, side rails up per policy, and non-skid footwear provided.   Outcome: Progressing  Patient has been free from falls during this shift. Call light within reach. Bed in low position. Door open.

## 2015-09-02 NOTE — Unmapped (Signed)
Patient resting in bed; prn medication given per patient's request; effective; continues on TPN therapy; patient declines care from caregivers; nurse assessment of dressings and drains. Declined scheduled medication Gabapentin, Metoprolol this evening; continues to tearful at times during shift; vital signs stable; call light in reach; visual checks during the shift.

## 2015-09-02 NOTE — Unmapped (Signed)
RN Clinical Progress Note      Patient comes to [facility] for Post-Acute Care for the following reasons: jewish 07/05/15 dx: sepsis    Patient Active Problem List    Diagnosis Date Noted   ??? Adjustment disorder with mixed anxiety and depressed mood 08/06/2015   ??? Pain of multiple sites 08/06/2015   ??? Cognitive dysfunction-recent onset. 08/06/2015   ??? 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 Malnutrition 07/08/2015   ??? Fistula 07/08/2015   ??? Abdominal abscess 07/04/2015       Filed Vitals:    09/02/15 0532 09/02/15 0805 09/02/15 1143 09/02/15 1741   BP: 107/60  96/55 96/58   Pulse: 105  115 122   Temp: 98.6 ??F (37 ??C)  97.9 ??F (36.6 ??C) 97.3 ??F (36.3 ??C)   TempSrc: Oral  Axillary Axillary   Resp: 19  22 22    Height:       Weight:  95 lb 1 oz (43.12 kg)     SpO2: 100%  98% 99%       Lines/Drains/Access - Yes   If yes, please list  R T IJ    Does the patient have any changes to their condition warranting intervention? No       Does the patient's have any transfers/appointments or testing scheduled -   No future appointments.    Patient/Family questions - No    RN Shift Note Additional:  Patient Mayrani Sakamoto is a 45 y.o. female that is alert and oriented x4. Patient is able to make needs known. Assessment completed and documented. Patient tolerated meds well. Pt c/o of pain treated with PRN Dilaudid, see MAR. Pt is afebrile. Patient respiratory status constant. Patient stable. Pt currently resting in bed. Call light within reach.     [name], RN  09/02/2015 6:10 PM

## 2015-09-02 NOTE — Unmapped (Signed)
Problem: Compromised Skin Integrity  Goal: STG - Patient will maintain good skin integrity  Outcome: Not Progressing  Patient is declining for staff to provide care and assess dressings to decrease risk for additional skin breakdown; explained to the patient the rationale; patient states she understands but continues to decline care; need continued education; will approach patient later in the shift.

## 2015-09-02 NOTE — Unmapped (Addendum)
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 Presented to [facility] with abdominal distention and fevers.?? Identified as having pelvic and intraabdominal abscess - drained by IR?? - mixed enteric flora with no predominance.?? Readmitted to [facility] with bleeding for wound.?? Course complicated by ischemia to right foot..??     Subjective & Interval History;    Pt somnolent.  All interval notes reviewed.  As per wound care, new fistula were found close to old fistulas.     Recent labs reviewed.  Vital signs are stable .    Review of Systems:   as above.     Vital signs are stable .      PMH: Unchanged from H&P.   PSH: Unchanged from H&P.   Family medical history: Unchanged from H&P.   Social history: Unchanged from H&P      Scheduled Meds:  ??? clopidogrel  75 mg Per NG tube Daily 0900   ??? enoxaparin  40 mg Subcutaneous Daily 0900   ??? famotidine (PF)  20 mg Intravenous BID   ??? fentaNYL  1 patch Transdermal Q72H   ??? gabapentin  150 mg Oral 3 times per day   ??? insulin regular  0-5 Units Subcutaneous Daily 0900   ??? metoprolol tartrate  12.5 mg Oral BID   ??? oxyCODONE  15 mg Oral Q4H2 SCH     Continuous Infusions:  ??? TPN ADULT ([facility]) 69.8 mL/hr at 09/01/15 1800   ??? TPN ADULT ([facility])       PRN Meds:ALPRAZolam, alteplase, dextrose, dextrose 50 % in water (D50W), diclofenac sodium, heparin lock flush, HYDROmorphone, ipratropium-albuterol, LORazepam, ondansetron    Objective:    Vital signs in last 24 hours:  Temp:  [97.9 ??F (36.6 ??C)-98.6 ??F (37 ??C)] 97.9 ??F (36.6 ??C)  Heart Rate:  [105-133] 115  Resp:  [19-22] 22  BP: (96-107)/(55-60) 96/55 mmHg    Physical Examination:    GENERAL: The patient is somnolent ,not in acute cardiorespiratory distress. Looking chronically sick.   HEENT:Atraumatic.Normocephalic. PERRLA, EOMI. No icterus, no pallor, no jugular venous distention..No ear or  nose discharge.  NECK: Supple. Trachea is in midline. No Thyromegaly  CHEST: Symmetric. Clear to auscultation, bilateral equal air entry. No wheezing or rhonchi.  CARDIOVASCULAR: S1, S2, Regular pulse. No murmur or gallops. No JVD.  ABDOMEN: Whole anterior abdomen is covered with pouch. Brownish fistula output noted.Bilateral nephrostomy tubes.  EXTREMITIES: There is + BLE edema . Very tender to touch both legs.  Right foot has dressing in place. Right toes are gangrenous.   Skin;No bruise,No rash.No petechiae.  CNS;  No focal neurological deficit.       Intake/Output last 3 shifts:  I/O last 3 completed shifts:  In: 1693   Out: 350 [Urine:300; Drains:50]    Recent Labs;                           Lab name 08/29/15  0648 08/31/15  0459 09/02/15  0359   WBC 11.2* 7.4 8.7   HEMOGLOBIN 7.8* 7.7* 7.6*   HEMATOCRIT 25.5* 24.8* 23.8*   MEAN CORPUSCULAR VOLUME 91.7 90.9 89.9   PLATELETS 573* 559* 575*  Lab name 08/26/15  0521 08/29/15  0648 09/02/15  0359   SODIUM 138 137 137   POTASSIUM 4.5 4.3 4.5   CREATININE 0.50* 0.60 0.54*   BUN 26* 33* 30*   CHLORIDE 103 104 104   CO2 27 26 28    PHOSPHORUS 3.8 3.7 4.1   MAGNESIUM 1.7 2.0 2.0                         Lab name 08/12/15  0521 08/19/15  0520 08/26/15  0521 09/02/15  0359   AST 14 23 23 14    ALT 15 26 17 17    BILIRUBIN TOTAL 0.2 0.3 0.3 0.2       Lab name 07/05/15  0702  07/09/15  0544  08/26/15  0521 08/29/15  0648 09/02/15  0359   PREALBUMIN 14.0*  --  16.0*  --   --   --   --    ALBUMIN  --   < > 1.5*  < > <1.5*   <1.5* <1.5* <1.5*   <1.5*   < > = values in this interval not displayed.                                                                                                   Lab name 08/29/15  0648 09/02/15  0359   CALCIUM 7.9* 7.9*   PHOSPHORUS 3.7 4.1                          Lab name 08/26/15  0521 09/02/15  0359   TRIGLYCERIDES 96 89                        Lab name 08/05/15  0900 08/07/15  0850  08/10/15  0830   VANCOMYCIN TROUGH 9.3* 9.1* 17.4       CT abdomen 3/24  1. Multiple abscess, large pelvic abscess with surgical drain,  right issue rectal fossa.  2. 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  3. Granulation tissue enhancement or blood extravasation in the  lower abdominal wall as described  4. Proximity of the small bowel vasculature at the level of the  open wound on the right, side of active bleeding  5. Lower lobe bronchopneumonia and aspiration is also considered    Diet;    Diet Orders          TPN ADULT ([facility]) at 69.8 mL/hr starting at 05/22 1800    TPN ADULT ([facility]) at 69.8 mL/hr starting at 05/21 1800    Diet NPO effective now Except for: except ice chips, except sips of clears starting at 03/28 0959            Assessment/Plan:      1. Peritoneal abscess- Mixed flora, pigtail drain 06/26/15 . Vanco/Zosyn/Diflucan.           -Completed  6 week Abx on 08/12/15.      2. Enterocutaneous Fistula- wound care, bowel rest. Still  has significant output.    3. Severe intractable acute on chronic pain syndrome- receiving Dilaudid IV every 3 hr  Prn,Fentanyl patch decreased to 25  from 50  mcg. Started PO Oxycodone scheduled as she had better response with it previously. Added Gabapentin as she was on previously.         Consulted Psychiatry for depression, chronic pain and recommended SNRIs to help with pain and depression.She want to try therapy first.  Will gradually decrease the IV dilaudid dose. Decreased to 3 mg IV Q 4 hr today.     4. Thrombocytosis. uncertain etiology- follow closely. Likely reactive.    5. Cachexia-   continue TPN, recheck PAB. Albumin stays less than 1.5.    6. Sinus tachycardia. Likely related to pain. Monitor.    7. Ischemic feet/arterial thromboemboli- The patient had a clot in her femoral artery right leg that was cleared by vascular surgery.  However the patient ended up with necrosis at her distal digits  which may be a combination of lack of blood flow and the use of pressors. Refusing heparin, aspirin and Plavix. Refused all heparin injections previously as well. Canceled aspirin but continue to encourage Plavix..           Following podiatrist at [facility].    8. History of colon cancer s/p exenteration with bilateral nephrostomy tubes    9. Recurrent presacral abscesses. Completed the course of Abx.    10. Debility- PT/OT    11. Guarded prognosis.     12. On TPN. 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 Cant tolerate PO. Weekly LFT while on TPN. TPN being managed by pharmacy. Electrolytes replacement through TPN. LFT are WNL so far.Mg being replaced through TPN.    13. D/C planning . Family meeting held on  5/3.  Pt need extensive wound care, TPN, IV Pain meds which are hurdle to discharge.    14. Nausea.  Zofran.    15.  New JP drain was placed on 5/16.    Will continue current treatment plan.  Recent labs reviewed.  F/u surgeon office tomorrow. As per wound care, new fistula was found close to previous fistula.   Pt has been refusing to take Plavix.        Prophylaxis;  GI Prophylaxis: Pepcid  DVT Prophylaxis: Lovenox     Code Status: Full Code      Bethany Hirt MD.       09/02/2015

## 2015-09-03 ENCOUNTER — Inpatient Hospital Stay: Admit: 2015-09-03 | Payer: MEDICARE | Attending: Surgery | Primary: Internal Medicine

## 2015-09-03 LAB — POC GLU MONITORING DEVICE: POC Glucose Monitoring Device: 120 mg/dL (ref 70–100)

## 2015-09-03 MED ORDER — LIDOCAINE HCL 4 % EX SOLN
4 % | Freq: Once | CUTANEOUS | Status: DC
Start: 2015-09-03 — End: 2015-09-04

## 2015-09-03 MED ORDER — TPN ADULT (WCH/Drake)
10 | INTRAVENOUS | Status: AC
Start: 2015-09-03 — End: 2015-09-04
  Administered 2015-09-03: 22:00:00 via INTRAVENOUS

## 2015-09-03 MED FILL — OXYCODONE 20 MG/ML ORAL CONCENTRATE: 20 20 mg/mL | ORAL | Qty: 1

## 2015-09-03 MED FILL — METOPROLOL TARTRATE 12.5 MG ORAL DOSE: 12.5 12.5 MG | ORAL | Qty: 1

## 2015-09-03 MED FILL — ONDANSETRON HCL (PF) 4 MG/2 ML INJECTION SOLUTION: 4 4 mg/2 mL | INTRAMUSCULAR | Qty: 2

## 2015-09-03 MED FILL — FAMOTIDINE (PF) 20 MG/2 ML INTRAVENOUS SOLUTION: 20 20 mg/2 mL | INTRAVENOUS | Qty: 2

## 2015-09-03 MED FILL — GABAPENTIN 250 MG/5 ML ORAL SOLUTION: 50 50 mg/mL | ORAL | Qty: 5

## 2015-09-03 MED FILL — HYDROMORPHONE 2 MG/ML INJECTION SYRINGE: 2 2 mg/mL | INTRAMUSCULAR | Qty: 2

## 2015-09-03 MED FILL — TRAVASOL 10 % INTRAVENOUS SOLUTION: 10 10 % | INTRAVENOUS | Qty: 799

## 2015-09-03 NOTE — Unmapped (Signed)
Received notification from nursing that Dr. Luan Pulling is requesting pain mgt team.  Pain clinic consult placed.  Will need to be reviewed and processed before it can be scheduled.  CM to follow up on Thursday.

## 2015-09-03 NOTE — Unmapped (Signed)
Problem: Daily Care  Goal: Daily care needs are met  Assess and monitor ability to perform self care and identify potential discharge needs.   Outcome: Progressing

## 2015-09-03 NOTE — Unmapped (Signed)
Occupational Therapy   Reason Patient Not Seen         Name: Linda Ponce  DOB: 03/11/1971  Attending Physician: Valda Lamb, MD  Admission Diagnosis: jewish 07/05/15 dx: sepsis  Date: 09/03/2015    Reviewed Pertinent hospital course: Yes    Unable to see patient due to : Pt refused OT services this date due to just returning from appointment and being in too much pain. Pt request therapist return next scheduled date.     Cont per POC in colboration with OTR/L  [name], COTA/L

## 2015-09-03 NOTE — Unmapped (Signed)
Went to dr appt this am. Medicated per request for pain with some relief. Falls asleep and the minute she wakes up she literally cries for more pain med. Wont let me do any treatments: will not take any po meds, will not turn, even cried when I emptied nephro tubes. Sister called and wants pain mge. Dr., charge nurse and case mge made aware. Taking ice chips without c/o.

## 2015-09-03 NOTE — Unmapped (Signed)
Pt restless and anxious  Med x 3 for c/o pain - s/w effective  tpn infusing w/o difficulty

## 2015-09-03 NOTE — Unmapped (Signed)
Problem: Fall Prevention  Goal: Patient will remain free of falls  Assess and monitor vitals signs, neurological status including level of consciousness and orientation. Reassess fall risk per hospital policy.    Ensure arm band on, uncluttered walking paths in room, adequate room lighting, call light and overbed table within reach, bed in low position, wheels locked, side rails up per policy, and non-skid footwear provided.   Intervention: Keep bed in low position  Bed in low position and call light within reach; patient urged to use call light for any needed assistance

## 2015-09-03 NOTE — Unmapped (Signed)
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 Presented to [facility] with abdominal distention and fevers.?? Identified as having pelvic and intraabdominal abscess - drained by IR?? - mixed enteric flora with no predominance.?? Readmitted to [facility] with bleeding for wound.?? Course complicated by ischemia to right foot..??     Subjective & Interval History;    Awake. Not looking in acute cardiorespiratory distress.  Pt had f/u visit with surgeon office.    D/W the pain medication regimen.  She was not happy with decreasing the dose of Dilaudid.I told her the reason that she was very somnolent in the last few day and missing her regular scheduled medication.  Very anxious.     Not looking in acute cardiorespiratory distress.  Fistula has significant output.     No fever, chills. No CP, SOB.       Review of Systems:  10 point ROS was negative except as above.       Vital signs are stable .      PMH: Unchanged from H&P.   PSH: Unchanged from H&P.   Family medical history: Unchanged from H&P.   Social history: Unchanged from H&P      Scheduled Meds:  ??? clopidogrel  75 mg Per NG tube Daily 0900   ??? enoxaparin  40 mg Subcutaneous Daily 0900   ??? famotidine (PF)  20 mg Intravenous BID   ??? fentaNYL  1 patch Transdermal Q72H   ??? gabapentin  150 mg Oral 3 times per day   ??? insulin regular  0-5 Units Subcutaneous Daily 0900   ??? metoprolol tartrate  12.5 mg Oral BID   ??? oxyCODONE  15 mg Oral Q4H2 SCH     Continuous Infusions:  ??? TPN ADULT ([facility]) 69.8 mL/hr at 09/02/15 1706   ??? TPN ADULT ([facility])       PRN Meds:ALPRAZolam, alteplase, dextrose, dextrose 50 % in water (D50W), diclofenac sodium, heparin lock flush, HYDROmorphone, ipratropium-albuterol, LORazepam, ondansetron    Objective:    Vital signs in last 24 hours:  Temp:  [97.3 ??F (36.3 ??C)-99.7 ??F (37.6 ??C)] 99.7 ??F (37.6 ??C)  Heart  Rate:  [115-145] 145  Resp:  [18-22] 22  BP: (96-101)/(55-58) 101/56 mmHg    Physical Examination:    GENERAL: The patient is somnolent ,not in acute cardiorespiratory distress. Looking chronically sick.   HEENT:Atraumatic.Normocephalic. PERRLA, EOMI. No icterus, no pallor, no jugular venous distention..No ear or nose discharge.  NECK: Supple. Trachea is in midline. No Thyromegaly  CHEST: Symmetric. Clear to auscultation, bilateral equal air entry. No wheezing or rhonchi.  CARDIOVASCULAR: S1, S2, Regular pulse. No murmur or gallops. No JVD.  ABDOMEN: Whole anterior abdomen is covered with pouch. Brownish fistula output noted.Bilateral nephrostomy tubes.  EXTREMITIES: There is + BLE edema . Very tender to touch both legs.  Right foot has dressing in place. Right toes are gangrenous.   Skin;No bruise,No rash.No petechiae.  CNS;  No focal neurological deficit.       Intake/Output last 3 shifts:  I/O last 3 completed shifts:  In: 920 [IV Piggyback:20]  Out: 750 [Drains:750]    Recent Labs;                           Lab name 08/29/15  1610 08/31/15  0459 09/02/15  0359   WBC 11.2* 7.4 8.7   HEMOGLOBIN 7.8* 7.7* 7.6*   HEMATOCRIT 25.5* 24.8* 23.8*   MEAN CORPUSCULAR VOLUME 91.7 90.9 89.9   PLATELETS 573* 559* 575*                                                            Lab name 08/26/15  0521 08/29/15  0648 09/02/15  0359   SODIUM 138 137 137   POTASSIUM 4.5 4.3 4.5   CREATININE 0.50* 0.60 0.54*   BUN 26* 33* 30*   CHLORIDE 103 104 104   CO2 27 26 28    PHOSPHORUS 3.8 3.7 4.1   MAGNESIUM 1.7 2.0 2.0                         Lab name 08/12/15  0521 08/19/15  0520 08/26/15  0521 09/02/15  0359   AST 14 23 23 14    ALT 15 26 17 17    BILIRUBIN TOTAL 0.2 0.3 0.3 0.2       Lab name 07/05/15  0702  07/09/15  0544  08/26/15  0521 08/29/15  0648 09/02/15  0359   PREALBUMIN 14.0*  --  16.0*  --   --   --   --    ALBUMIN  --   < > 1.5*  < > <1.5*   <1.5* <1.5* <1.5*   <1.5*   < > = values in this interval not displayed.                                                                                                    Lab name 08/29/15  0648 09/02/15  0359   CALCIUM 7.9* 7.9*   PHOSPHORUS 3.7 4.1                          Lab name 08/26/15  0521 09/02/15  0359   TRIGLYCERIDES 96 89                        Lab name 08/05/15  0900 08/07/15  0850 08/10/15  0830   VANCOMYCIN TROUGH 9.3* 9.1* 17.4       CT abdomen 3/24  1. Multiple abscess, large pelvic abscess with surgical drain,  right issue rectal fossa.  2. 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  3. Granulation tissue enhancement or blood extravasation in the  lower abdominal wall as described  4. Proximity of the small bowel vasculature at the level of the  open wound on the right, side of active bleeding  5. Lower lobe bronchopneumonia and aspiration is also considered    Diet;    Diet Orders          TPN ADULT (  [facility]) at 69.8 mL/hr starting at 05/23 1800    TPN ADULT ([facility]) at 69.8 mL/hr starting at 05/22 1800    Diet NPO effective now Except for: except ice chips, except sips of clears starting at 03/28 0959            Assessment/Plan:      1. Peritoneal abscess- Mixed flora, pigtail drain 06/26/15 . Vanco/Zosyn/Diflucan.           -Completed  6 week Abx on 08/12/15.      2. Enterocutaneous Fistula- wound care, bowel rest. Still has significant output.    3. Severe intractable acute on chronic pain syndrome- receiving Dilaudid IV every 3 hr  Prn,Fentanyl patch decreased to 25  from 50  mcg. Started PO Oxycodone scheduled as she had better response with it previously. Added Gabapentin as she was on previously.         Consulted Psychiatry for depression, chronic pain and recommended SNRIs to help with pain and depression.She want to try therapy first.  Will gradually decrease the IV dilaudid dose. Decreased to 3 mg IV Q 4 hr  On 5/22. Will refer to pain management.     4. Thrombocytosis. uncertain etiology- follow closely. Likely  reactive.    5. Cachexia-   continue TPN, recheck PAB. Albumin stays less than 1.5.    6. Sinus tachycardia. Likely related to pain. Monitor.    7. Ischemic feet/arterial thromboemboli- The patient had a clot in her femoral artery right leg that was cleared by vascular surgery.  However the patient ended up with necrosis at her distal digits which may be a combination of lack of blood flow and the use of pressors. Refusing heparin, aspirin and Plavix. Refused all heparin injections previously as well. Canceled aspirin but continue to encourage Plavix..           Following podiatrist at [facility].    8. History of colon cancer s/p exenteration with bilateral nephrostomy tubes    9. Recurrent presacral abscesses. Completed the course of Abx.    10. Debility- PT/OT    11. Guarded prognosis.     12. On TPN. 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 Cant tolerate PO. Weekly LFT while on TPN. TPN being managed by pharmacy. Electrolytes replacement through TPN. LFT are WNL so far.Mg being replaced through TPN.    13. D/C planning . Family meeting held on  5/3.  Pt need extensive wound care, TPN, IV Pain meds which are hurdle to discharge.    14. Nausea.  Zofran.    15.  New JP drain was placed on 5/16.    Will continue current treatment plan.  Recent labs reviewed.  As per wound care, new fistula was found close to previous fistula. Pt was seen in surgeons office today. Will f/u the recommendations.   Pt has been refusing to take Plavix, Lovenox. D/w her today the reason for that. She states that she chokes on Plavix.   No problem with swallowing Oxycodone. States that she will start taking it tomorrow.         Prophylaxis;  GI Prophylaxis: Pepcid  DVT Prophylaxis: Lovenox     Code Status: Full Code      Taira Knabe MD.       09/03/2015

## 2015-09-03 NOTE — Unmapped (Signed)
Problem: Fall Prevention  Goal: Patient will remain free of falls  Assess and monitor vitals signs, neurological status including level of consciousness and orientation. Reassess fall risk per hospital policy.    Ensure arm band on, uncluttered walking paths in room, adequate room lighting, call light and overbed table within reach, bed in low position, wheels locked, side rails up per policy, and non-skid footwear provided.   Intervention: Keep call light within reach  Call light and bedside table within reach

## 2015-09-03 NOTE — Unmapped (Signed)
Clinical Pharmacy Note - TPN Order    Linda Ponce is a 45 y.o. female that is being followed by the clinical pharmacy department for monitoring and adjustment of TPN.    Patient remains NPO with ice chips, sips of clear liquids.    Plan:    -no new TPN labs to review  -continue current continuousTPN-no changes    Rilley Poulter E Joory Gough [facility] 09/03/2015 9:46 AM

## 2015-09-03 NOTE — Progress Notes (Signed)
[place]  Progress Note       Erica Harding  AGE: 45 y.o.   GENDER: female  DOB: 1970-07-04  TODAY'S DATE:  09/03/2015    Subjective:     Chief Complaint   Patient presents with   ??? Wound Check     rt. foot abd         HISTORY of PRESENT ILLNESS HPI     Erica Harding is a 45 y.o. female who presents today for wound evaluation.   History of Wound: Abdominal wound, enterocutaneous fistulas    Wound Pain:  severe  Severity:  8 / 10   Wound Type:  non-healing surgical  Modifying Factors:  none  Associated Signs/Symptoms:  none        PAST MEDICAL HISTORY        Diagnosis Date   ??? AKI (acute kidney injury) ([place])    ??? Colorectal cancer ([place])    ??? Encephalopathies    ??? Fistula    ??? Gangrene ([place])     right foot   ??? Peritonitis ([place])        PAST SURGICAL HISTORY    Past Surgical History:   Procedure Laterality Date   ??? ABDOMEN SURGERY     ??? COLONOSCOPY     ??? HYSTERECTOMY         FAMILY HISTORY    No family history on file.    SOCIAL HISTORY    Social History   Substance Use Topics   ??? Smoking status: Never Smoker   ??? Smokeless tobacco: Not on file   ??? Alcohol use No       ALLERGIES    Allergies   Allergen Reactions   ??? Latex Itching   ??? Compazine [Prochlorperazine Maleate] Swelling       MEDICATIONS    Current Outpatient Prescriptions on File Prior to Encounter   Medication Sig Dispense Refill   ??? fentaNYL (DURAGESIC) 50 MCG/HR Place 1 patch onto the skin every 72 hours .     ??? gabapentin (NEURONTIN) 100 MG capsule Take 100 mg by mouth 3 times daily     ??? oxyCODONE (ROXICODONE INTENSOL) 100 MG/5ML concentrated solution Take 15 mg by mouth every 4 hours as needed for Pain .     ??? vancomycin (VANCOCIN) 500 MG/100ML IVPB Infuse 150 mLs intravenously every 24 hours 2500 mL 0   ??? HYDROmorphone (DILAUDID) 1 MG/ML injection Infuse 1 mL intravenously every 2 hours as needed for Pain . (Patient taking differently: Infuse 1 mg intravenously every 3 hours  .) 1 mL 0   ??? acetaminophen (OFIRMEV) 10 MG/ML SOLN infusion  Infuse 74.1 mLs intravenously every 6 hours as needed for Fever or Pain 4000 mL 0   ??? aspirin 81 MG chewable tablet 1 tablet by Per NG tube route daily 30 tablet 3   ??? LORazepam (ATIVAN) 2 MG/ML injection Infuse 0.5 mLs intravenously every 4 hours as needed (anxiety) 1 mL 0   ??? ipratropium-albuterol (DUONEB) 0.5-2.5 (3) MG/3ML SOLN nebulizer solution Inhale 3 mLs into the lungs every 4 hours as needed for Shortness of Breath 360 mL 0   ??? glucose (GLUTOSE) 40 % GEL Take 15 g by mouth as needed (glc<70) 45 g 1   ??? insulin lispro (HUMALOG) 100 UNIT/ML pen Inject 0-6 Units into the skin every 4 hours 5 Pen 3   ??? ondansetron (ZOFRAN) 4 MG/2ML injection Infuse 2 mLs intravenously every 6 hours as needed for  Nausea 15 mL 0   ??? fluconazole (DIFLUCAN) 200MG /100ML IVPB Infuse 100 mLs intravenously every 24 hours 100 mL 0   ??? magnesium sulfate 10-5 MG/ML-% Infuse 100 mLs intravenously as needed (Per IV Magnesium Replacement Protocol) 100 mL 0   ??? potassium chloride 20 MEQ/50ML IVPB Infuse 50 mLs intravenously as needed (Per IV Potassium Replacement Protocol) 50 mL 0   ??? clopidogrel (PLAVIX) 75 MG tablet 1 tablet by Per NG tube route daily 30 tablet 3   ??? fat emulsion 20 % infusion Infuse 250 mLs intravenously twice a week 250 mL 0   ??? famotidine (PEPCID) 20 MG/2ML injection Infuse 2 mLs intravenously daily 2 mL 0     No current facility-administered medications on file prior to encounter.        REVIEW OF SYSTEMS    Pertinent items are noted in HPI.      Objective:      BP 103/84   Pulse 148   Temp 100.1 ??F (37.8 ??C) (Oral)    Resp 18    PHYSICAL EXAM    Head: normocephalic and atraumatic  Neck: neck supple and non tender without mass, no thyromegaly or thyroid nodules, no cervical lymphadenopathy  The largest open wound is the midline wound.  She is having a large amount of enteric drainage  The smaller wound is at the left anterior axillary line.  This is having less enteric drainage  New drain site in the left lower  quadrant appears irritated       Assessment:     Patient Active Problem List   Diagnosis   ??? AKI (acute kidney injury) ([place])   ??? Hyperkalemia   ??? Open wnd ant abdomen-comp   ??? Peritonitis and retroperitoneal infections ([place])   ??? Fistula   ??? Ischemic foot   ??? Cachexia ([place])   ??? Postprocedural intra-abdominal sepsis ([place])   ??? Abscess   ??? Dehydration   ??? Encephalopathies   ??? Gangrene of foot ([place])   ??? Enterocutaneous fistula   ??? Abnormal CT of the chest   ??? Infiltrate noted on imaging study   ??? Open abdominal wall wound       Wound 06/25/15 Other (Comment) Abdomen Anterior;Mid midline abdominal surgical incision (Active)   Peri-wound Assessment Painful;Pink 09/03/2015  9:38 AM   Assessment Fragile 08/06/2015  9:30 AM   Number of days:69       Wound 06/25/15 Other (Comment) Abdomen Left;Lateral open incision to left lateral abdominal wall (Active)   Peri-wound Assessment Fragile 08/06/2015  9:30 AM   Assessment Fragile;Pink 08/06/2015  9:30 AM   Number of days:69       Wound 07/05/15 Other (Comment) Foot Right;Plantar #1 (Active)   Peri-wound Assessment Black 09/03/2015  9:38 AM   Assessment Black;Pink 08/13/2015  8:44 AM   Wound Length (cm) 3.7 cm 08/13/2015  8:44 AM   Wound Width (cm) 6.5 cm 08/13/2015  8:44 AM   Depth (cm)  0.1 08/13/2015  8:44 AM   Calculated Wound Size (cm^2) (l*w) 24.05 cm^2 08/13/2015  8:44 AM   Dressing/Treatment Dry dressing 09/03/2015  9:38 AM   Drainage Amount Moderate 08/13/2015  8:44 AM   Drainage Description Serosanguinous 08/13/2015  8:44 AM   Number of days:54       45 year old female with a complex past surgical history who is being followed for multiple enterocutaneous fistulas.  She is on TPN and bowel rest but continues to have a large amount of output.  She  is opposed to the idea of octreotide.  Reports from [facility] are that she has not been cooperative with dressing changes and physical therapy.  Her left lower quadrant drain was changed per interventional radiology last week.  She complains of  tenderness at this site.    Plan:     Continue current wound care and TPN.  The patient was encouraged to cooperate with wound care and physical therapy.  She was instructed to inquire about a pain management team at [facility].  Follow-up in 2 weeks.    Discharge Treatment Wound 07/05/15 Other (Comment) Foot Right;Plantar #1-Dressing/Treatment: Dry dressing        Written Patient Discharge Instructions Given            Electronically signed by Elly Modena, MD on 09/03/2015 at 10:34 AM

## 2015-09-03 NOTE — Plan of Care (Signed)
NAME:  Building control surveyor  DATE OF BIRTH:  1970-06-24  MEDICAL RECORD NUMBER:  [id number]  DATE:  09/03/2015    Dressing applied per orders.  Discharge instructions given.  Patient verbalized understanding.  Return to [facility] in 2 weeks.  Instructed to speak with MD's at [facility] regarding pain management, VU    ??? 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    [x] Pain related to Atypical Ulcer    [x] Knowledge deficit related to the diagnosis & management of Atypical ulcer    Goals    [] Non invasive Arterial/Venous studies ordered       []  Goal met          [] Goal not met     [x] Control of pain,exudate      [x]  Goal met          [] Goal not met    [x] Verbalize understanding of disease process      [x]  Goal met          [] Goal not met     [] Improved Oxygenation      []  Goal met          [] Goal not met      Nursing Interventions/Evaluation    [x] At every visit assess Pain, exudate    [x] Provide education on Atypical Ulcer    [x] Assessment of ulcer each visit      Diagnostic/Treatment activity    [] non-invasive arterial/Venous studies    [] Bx/Pathology   [] Cx  [] Consult       [] Debridement    I[] Scheduled Angiogram       Electronically signed by Thea Silversmith, RN on 09/03/2015 at 10:29 AM

## 2015-09-04 LAB — POC GLU MONITORING DEVICE: POC Glucose Monitoring Device: 139 mg/dL — ABNORMAL HIGH (ref 70–100)

## 2015-09-04 MED ORDER — TPN ADULT (WCH/Drake)
INTRAVENOUS | Status: AC
Start: 2015-09-04 — End: 2015-09-05
  Administered 2015-09-04: 21:00:00 via INTRAVENOUS

## 2015-09-04 MED ORDER — mirtazapine (REMERON SOL-TAB) disintegrating tablet 15 mg
15 | Freq: Every evening | ORAL | Status: AC
Start: 2015-09-04 — End: 2015-09-13
  Administered 2015-09-05: 05:00:00 15 mg via ORAL

## 2015-09-04 MED ORDER — metoprolol tartrate (LOPRESSOR) tablet 25 mg
25 | Freq: Two times a day (BID) | ORAL | Status: AC
Start: 2015-09-04 — End: 2015-09-05
  Administered 2015-09-05: 13:00:00 25 mg via ORAL

## 2015-09-04 MED FILL — GABAPENTIN 250 MG/5 ML ORAL SOLUTION: 50 50 mg/mL | ORAL | Qty: 5

## 2015-09-04 MED FILL — FAMOTIDINE (PF) 20 MG/2 ML INTRAVENOUS SOLUTION: 20 20 mg/2 mL | INTRAVENOUS | Qty: 2

## 2015-09-04 MED FILL — ONDANSETRON HCL (PF) 4 MG/2 ML INJECTION SOLUTION: 4 4 mg/2 mL | INTRAMUSCULAR | Qty: 2

## 2015-09-04 MED FILL — ENOXAPARIN 40 MG/0.4 ML SUBCUTANEOUS SYRINGE: 40 40 mg/0.4 mL | SUBCUTANEOUS | Qty: 0.4

## 2015-09-04 MED FILL — HYDROMORPHONE 2 MG/ML INJECTION SYRINGE: 2 2 mg/mL | INTRAMUSCULAR | Qty: 2

## 2015-09-04 MED FILL — METOPROLOL TARTRATE 25 MG TABLET: 25 25 MG | ORAL | Qty: 1

## 2015-09-04 MED FILL — OXYCODONE 20 MG/ML ORAL CONCENTRATE: 20 20 mg/mL | ORAL | Qty: 1

## 2015-09-04 MED FILL — FENTANYL 25 MCG/HR TRANSDERMAL PATCH: 25 25 mcg/hr | TRANSDERMAL | Qty: 1

## 2015-09-04 MED FILL — TRAVASOL 10 % INTRAVENOUS SOLUTION: 10 10 % | INTRAVENOUS | Qty: 799

## 2015-09-04 MED FILL — CLOPIDOGREL 75 MG TABLET: 75 75 mg | ORAL | Qty: 1

## 2015-09-04 NOTE — Unmapped (Signed)
Patient has been in bed all day  Refused care to wounds and abdomen but allowed charge RN to look at abdomen  TPN infusing at 69.8 via TLC  All fistula pouches intact and bilateral nephrostomies patent  Foley patent

## 2015-09-04 NOTE — Unmapped (Signed)
again offered scheduled medications to the patient and refused administration states told Roxanne I will start tomorrow.

## 2015-09-04 NOTE — Unmapped (Signed)
Complains of nausea  Medicated with zofran 4 mg IV

## 2015-09-04 NOTE — Unmapped (Signed)
Patient was very agitated and anxious some times crying; reported continuous pain constantly rated 9 or 10 and 7 or 8 during pain re-assessment after pain medication administration. Patient stated that every body is mean to her and do not understand her excoriating pain.

## 2015-09-04 NOTE — Unmapped (Signed)
Clinical Pharmacy Note - TPN Order    Linda Ponce is a 45 y.o. female that is being followed by the clinical pharmacy department for monitoring and adjustment of TPN.    Patient remains NPO with ice chips, sips of clear liquids.  Patient has new fistula close to previous fistula    Plan:    -no new TPN labs today  -continue current continuous TPN  -routine TPN labs due tomorrow    Linda Ponce [facility] 09/04/2015 11:11 AM

## 2015-09-04 NOTE — Unmapped (Signed)
Occupational Therapy   Reason Patient Not Seen         Name: Linda Ponce  DOB: 1970/06/02  Attending Physician: Valda Lamb, MD  Admission Diagnosis: jewish 07/05/15 dx: sepsis  Date: 09/04/2015    Reviewed Pertinent hospital course: Yes    Unable to see patient due to : Pt refused OT services this date due to pain and anxiety. Dr and patient advocate present talking pt through anxiety. Will follow up next scheduled visit.  Cont per POC in colboration with OTR/L  [name], COTA/L

## 2015-09-04 NOTE — Unmapped (Signed)
[place] [facility] Inpatient Medical Psychology Service   Department of Neurology & Rehabilitation Medicine  Division of Neuropsychology & Medical Psychology     INPATIENT MEDICAL PSYCHOLOGY PROGRESS NOTE    Patient Name:  Linda Ponce          DOB:  [birth year]  Admit Date:  07/08/2015   CSN:  [id number]    Referring Provider:  Valda Lamb, MD  Location:  [name] Room N319/DN319-02     Current Date: 09/04/2015  Time and Duration of Service: 12:20p-12:50p = 30 minutes + 8:30p-10:40p = 130 min = total face to face time of 160 minutes    CPT Code:  09811 - Health & Behavior Intervention - Individual - 11 units - 160 min total time.    Current Problem List:      Patient Active Problem List   Diagnosis   ??? Abdominal abscess   ??? Enterocutaneous fistula   ??? Malnutrition   ??? Fistula   ??? Adjustment disorder with mixed anxiety and depressed mood   ??? Pain of multiple sites   ??? Cognitive dysfunction-recent onset.       Current Medications:     Scheduled Medications:    ??? clopidogrel  75 mg Per NG tube Daily 0900   ??? enoxaparin  40 mg Subcutaneous Daily 0900   ??? famotidine (PF)  20 mg Intravenous BID   ??? fentaNYL  1 patch Transdermal Q72H   ??? gabapentin  150 mg Oral 3 times per day   ??? insulin regular  0-5 Units Subcutaneous Daily 0900   ??? metoprolol tartrate  25 mg Oral BID   ??? oxyCODONE  15 mg Oral Q4H2 SCH     PRN Medications:  ALPRAZolam, alteplase, dextrose, dextrose 50 % in water (D50W), diclofenac sodium, heparin lock flush, HYDROmorphone, ipratropium-albuterol, LORazepam, ondansetron      Objective Presentation/Subjective Concerns and Related Interventions:      1:30p-2:10p (30 min):   ?? Ranesha presented as alert, attentive and interested in talking. She was upset about everything, specifically, reduction in her dilaudid to 3 mg q4h prn which she now states is fine; she is aware of the plan to further reduce it. She also thinks staff are frustrated with her for  her frequent refusals; staff does report she often refuses of meds except for pain meds, hygienic care, turning, therapies, and wound care.     ?? She continues to be reluctant to accept that she does not have the psychological reserve to do what she needs to do to cope adequately with this, nor does she perceive herself as depressed despite frequent tearfulness, frustration, and low frustration tolerance. She is afraid of pain, of trying an antidepressant despite extensive education about the role of stress in depleting neurotransmitters involved in mood and chronic pain perception and how antidepressant can help the brain return them to normal allowing for better coping, improved participation in care and therapies, and reduced perceived pain intensity. She also is afraid of trying new treatments but won't take ativan available to her either out of fear.    ?? Her degree of distress and focus on pain has been so severe that she has not complied with her psychological intervention homework despite 2-3 prior visits where she reinforced preference for using psychological interventions to improve mood and coping. Focused on helping her see that what she is doing and refusing to do is not accomplishing her goal of getting home.      ??  Re-educated her about my professional opinion based on observation of her coping behavior that she needs a combination of pharmacotherapy and psychological intervention to improve her ability to cope with her medical treatment and therapy regimen as well as her psychological coping skills work.    ?? This part of the session ended due to impending family/team meeting. She is not happy that my opinion will be provided - she did not forbid it as I told her that I would ask her sister not to badger her about it.I will attend the Medical Team Conference with pt, her sister, mother on the phone, and treatment team (no MD today). I attended the meeting and provided education to mother and sister  about my concern for anxiety and depression making it very difficult for her to cope with her tx regimen and reasons for my recommendations.    8:30p-10:40p (130 min):  ?? Returned as pt requested to debrief the meeting and discuss her decision about trying an antidepressant.   ?? Debriefing focused on her anger at sister and mother for badgering her; engaged her in cognitive restructuring to help her see past that into the love and fear they have for her due to fear-based non-compliance.   ?? Extensive time spent answering her questions about antidepressants, debunking myths she has about them being addictive, and uncovering and addressing extensively her belief that admitting depression means she is crazy. This took quite a bit of time.   ?? Also re-educated her about the benefits of antidepressants and Remeron in particular as it is the only one that comes in a soluble tablet to melt on her tongue (refuses to swallow pills). Also identified most common side effects and duration, as well as a plan for stopping it if she chooses to. After many questions and a review of famous people who are depressed many of whom took antidepressants, she agreed to try Remeron Sol-tab. Since Dr Cloretta Ned informed me that he also believed and told her that she needed to start an antidepressant and she may be leaving for a SNF in 2-3 days, I asked the House MD to consider ordering it for starting tonight.    Psychiatric Diagnoses:      ?? Adjustment disorder with mixed anxiety and depressed mood.  ?? Related to prolonged hospitalization and inadequately controlled pain in abdomen, back, and legs.  ?? She has had NO prior treatment for anxiety or depression or substance use disorders.     ?? Pain of Multiple Sites.    ?? 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 Pt reports severe pain ranging from 8-10/10 even with fentanyl 125 mcg patch. Dilaudid IV q3h prn reduces it to 5/10 but does not last.  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 No history of narcotic abuse, but she has been on narcotics for abdominal and radiation pain from 2011 cancer surgery and complications x 6 yrs. Best regimen per pt post op at [facility] was oxycodone 15 mg q4h scheduled and dilaudid 1 mg q3h prn which she tapered down before discharge.She also took neurontin 300 mg TID when home prior to surgery for neuropathic pain (low back).  Psychosocial losses (death of father 1 week before surgery, missing teen son who is in [place] w/her mother) reduce ability to cope  with pain and hospitalization.     ?? Cognitive dysfunction--recent onset.    ?? Likely residual of significant delirium at [facility], pain medications, distraction from pain itself, and possibly IV ABX. Will monotor. Primarily involves orientation to time, and somewhat to place, short term recall, and working memory. Expected to improve as pt's wounds and infections improve.  ?? MMSE-2 score of 21/20 (T=21) indicates moderate cognitive impairment involving orientation, short term recall, and working memory.   ?? Clinical assessment of common sense reasoning skills indicated normal limits reasoning skills except when her pain gets severe, at which point she becomes fearful and suspicious about medications being withheld because she bother's the nurses too much.      NURSING:     ?? Lillia has decided that she wants to change her fistula bags herself as she has been doing it x 8 yrs as her prior hospitals required it.. She just wants you to bring her the supplies, washcloths, and trash bag she needs to do it. Please allow this if medically OK as her anxiety is far less and she feels more in control of her care. We need to foster this and  more.    RECOMMENDATIONS -- DR Cloretta Ned:    ?? Please monitor her response to Remeron Sol-tab as I am not here on Thursdays to check on her.     Med Psychology Plan:    ?? I will see her on Friday to follow up on trial of Remeron if she tolerates it well.       Thank you for allowing me to participate in the care of your patient.    Tiersa Dayley P. Minerva Ends, PhD  Licensed Insurance risk surveyor in Medical Psychology  [place] Department of Neurology & Rehabilitation Medicine  Division of Neuropsychology & Medical Psychology  Office Phone for non-urgent messages/requests:  [phone]  Pager--urgent issues only: [phone]    09/04/2015

## 2015-09-04 NOTE — Unmapped (Signed)
[place] [facility] Inpatient Medical Psychology Service  [place] Department of Neurology & Rehabilitation Medicine  Division of Neuropsychology & Medical Psychology     INPATIENT MEDICAL PSYCHOLOGY PROGRESS NOTE    Patient Name:  Linda Ponce          DOB:  May 23, 1970  Admit Date:  07/08/2015   CSN:  [id number]    Referring Provider:  Valda Lamb, MD  Location:  [name] Room N319/DN319-02     Current Date:  09/04/2015  Time and Duration of Service:  1:30p-2:40p  = 40 minutes face to face time     CPT Code:  09811 Medical Team Conference--pt & family present, 3 RNs, 1 Psychologist, and a SW, no MD. 40 minutes, 1.5 billable units,      Current Problem List:      Patient Active Problem List   Diagnosis   ??? Abdominal abscess   ??? Enterocutaneous fistula   ??? Malnutrition   ??? Fistula   ??? Adjustment disorder with mixed anxiety and depressed mood   ??? Pain of multiple sites   ??? Cognitive dysfunction-recent onset.       Current Medications:     Scheduled Medications:    ??? clopidogrel  75 mg Per NG tube Daily 0900   ??? enoxaparin  40 mg Subcutaneous Daily 0900   ??? famotidine (PF)  20 mg Intravenous BID   ??? fentaNYL  1 patch Transdermal Q72H   ??? gabapentin  150 mg Oral 3 times per day   ??? insulin regular  0-5 Units Subcutaneous Daily 0900   ??? metoprolol tartrate  25 mg Oral BID   ??? mirtazapine  15 mg Oral Nightly (2100)   ??? oxyCODONE  15 mg Oral Q4H2 SCH     PRN Medications:  ALPRAZolam, alteplase, dextrose, dextrose 50 % in water (D50W), diclofenac sodium, heparin lock flush, HYDROmorphone, ipratropium-albuterol, LORazepam, ondansetron    Objective Presentation/Subjective Concerns and Related Interventions:      ?? Mother on the phone; patient, her sister, myself, CM, Consulting civil engineer, RN, patient advocate, and SW were present in the room.   ?? Most disciplines reported significant non-compliance with medications and care, leading to failure to progress and insurance company demand to  send her to SNF in several days.   ?? Informed team and family with patient's knowledge, that I believed that severe anxiety and depression are major contributors to her inability to make herself comply with recommendations.   ?? Interventions:   ?? Once again, support and encouragement as well as education to both pt and family re: my recommendation of a trial of antidepressant to address her repeated complaints of being too emotionally tired to push herself through pain during therapy and having to work so hard to cope with pain and fatigue in general (she avoids sleep so as to not miss prn dilaudid dose).    ?? Reviewed consequences of not progressing in therapies or allowing wound care and medications to be administered--a dose of reality.   ?? Asked pt's sister not to badger her about the Remeron as she promised to tell her MD whether or not she will take it. Note that pt cannot walk in her boots in water,.    Psychiatric Diagnoses:      ?? Pain of Multiple Sites.    ?? Pain involves abdominal abscess and fistula related wounds (9/10 w/fentanyl 125 mcg patch), gangrenous feet R>L with neuropathic component (8/10 w  patch), and low back from multiple surgeries & radiation with neuropathic component (7/10, episodic, w/patch). Pt reports severe pain ranging from 8-10/10 even with fentanyl 125 mcg patch. Dilaudid IV q3h prn reduces it to 5/10 but does not last.  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 No history of narcotic abuse, but she has been on narcotics for abdominal and radiation pain from 2011 cancer surgery and complications x 6 yrs. Best regimen per pt post op at [facility] was oxycodone 15 mg q4h scheduled and dilaudid 1 mg q3h prn which she tapered down before discharge.She also took neurontin 300 mg TID when home prior to surgery for neuropathic pain (low back).  Psychosocial losses (death of father 1 week before surgery, missing teen son who is in [place] w/her mother)  reduce ability to cope with pain and hospitalization.     ?? Adjustment disorder with mixed anxiety and depressed mood.  ?? Related to prolonged hospitalization and inadequately controlled pain in abdomen, back, and legs.  ?? She has had NO prior treatment for anxiety or depression or substance use disorders.     ?? Cognitive dysfunction--recent onset.    ?? Likely residual of significant delirium at [facility], pain medications, distraction from pain itself, and possibly IV ABX. Will monotor. Primarily involves orientation to time, and somewhat to place, short term recall, and working memory. Expected to improve as pt's wounds and infections improve.  ?? MMSE-2 score of 21/20 (T=21) indicates moderate cognitive impairment involving orientation, short term recall, and working memory.   ?? Clinical assessment of common sense reasoning skills indicated normal limits reasoning skills except when her pain gets severe, at which point she becomes fearful and suspicious about medications being withheld because she bother's the nurses too much.    MEDICAL PSYCHOLOGY PLAN:     ?? I will continue to educate her and sister about the benefits of an antidepressant in her case, and engage her in CBT for depression and coping with pain her complex medical problems.   ?? Will debrief pt later today.      Issam Carlyon P. Minerva Ends, PhD  Licensed Insurance risk surveyor in Medical Psychology  [place] Department of Neurology & Rehabilitation Medicine  Office Phone for non-urgent messages/requests:  [phone]  Urgent issues-pager: [phone]    09/04/2015

## 2015-09-04 NOTE — Unmapped (Signed)
Patient lying in drainage from fistulas and attempted to change patient and patient continues to fight with RN about care and doesn't want to be bothered, patient representative Roxanne to speak with patient

## 2015-09-04 NOTE — Unmapped (Signed)
Problem: Fall Prevention  Goal: Patient will remain free of falls  Assess and monitor vitals signs, neurological status including level of consciousness and orientation. Reassess fall risk per hospital policy.    Ensure arm band on, uncluttered walking paths in room, adequate room lighting, call light and overbed table within reach, bed in low position, wheels locked, side rails up per policy, and non-skid footwear provided.   Outcome: Progressing  Morse fall scale 55  Alert and oriented times 4  Calls out appropriately when has needs  No need for alarms  Continue to monitor

## 2015-09-04 NOTE — Unmapped (Signed)
Spoke with Dr. Lajuana Ripple office and had them send progress note from yesterday's visit.  Sent fax back with updated medication list and notification of pain clinic appt on 09/24/15.

## 2015-09-04 NOTE — Unmapped (Signed)
Patient alert and oriented x4 upon assessment this evening.  Speech is clear, and patient appears calm.  No c/o cough or sore throat, and lung sounds are clear. Vitals are stable.  Patient has  c/o pain and discomfort with prn pain medication administration which was effective, and no s/s of distress are evident at this time.  Bed mobility and ADL care are performed with staff assistance when pt allows. Patient takes medications well.  Over bed table and call light is within reach.  Side rails are up x2.  Pt Ekans pouch leaking wont allow nurse to touch and change them, wants to do it alone, stating  I don't know if you all realize but I did this at home by myself. Will continue to monitor.

## 2015-09-04 NOTE — Unmapped (Signed)
Physical Therapy   Reason Patient Not Seen         Name: Linda Ponce  DOB: June 14, 1970  Attending Physician: Valda Lamb, MD  Admission Diagnosis: jewish 07/05/15 dx: sepsis  Date: 09/04/2015    Reviewed Pertinent hospital course: Yes    Unable to see patient due to :Patient ill OT/PT entered the room with patient advocator and MD present, pt stated not feeling well and she was in pain. Pt was left in room with advocator and MD. Plan to continue skilled PT to increase general endurance and B LE strength and mobility.    Dion Saucier, Student PT

## 2015-09-04 NOTE — Unmapped (Signed)
I received a phone call from patient's sister April who is planing on take Lennix home to her home at discharge. April reports that she wants to use [facility] and that is would like to use [facility] for nursing and PT/OT. April also is going to ask her friends who are PCA's to see if they are able to help her with Marjoria when she gets home. April is a LPN but that she will want someone to train her on how to give the TPN as she not given TPN for awhile.  April is worried about the cost of the TPN. I told her that the infusion company will usually tell me what the cost will be so that there would not be any surprises. Janaye should ready for discharge on June 7 this would allow time for Joneisha to be weaned of the IV pain meds and allow April time to get everybody in place that she will need to assist Lam at home.

## 2015-09-04 NOTE — Unmapped (Addendum)
Problem: Discharge Planning  Goal: Patient???s discharge needs are met  Collaborate with interdisciplinary team and initiate plans and interventions as needed.   Outcome: Progressing  Active discharge planning.    Comments:   The case management department provided an update regarding patient???s plan of care today during Interdisciplinary Rounds.    With: Nursing, SW, Psychologist, Patient Advocate, and RNCM    Met with patient, sister, and mother (via speaker phone), at bedside for care conference.  Patient is non-compliant with wound care & therapy d/t complaints of pain.  Pain clinic appt scheduled for 09/24/15.  Dr. Cloretta Ned has been attempting to taper down the IV Dilaudid.  Dr. Minerva Ends made many recommendations regarding depression and anxiety but patient was not agreeable to any of them at the time.  Sister is against SNF placement and states she will work towards taking the patient home with her.  SW coordinating discharge.  Patient will have to be weaned off IV medications.  TPN will need to be arranged at home and family trained to administer.  Family will need to be educated about wound care therapy.  Family will need to be educated on pain management and all medication administration.  Family will need to be trained by therapy on transfering patient to and from bed.  Anticipate discharge in two weeks.  Patient will need hhc - nursing, therapy, social work, aid services.  Therapy will recommend needed equipment at home.    Discharge recommendation is to SNF but patient/family declines    Barriers to discharge at this time include: IV Dilaudid, TPN

## 2015-09-04 NOTE — Unmapped (Signed)
Problem: Potential for Compromised Skin Integrity  Goal: Skin integrity is maintained or improved  Assess and monitor skin integrity. Identify patients at risk for skin breakdown on admission and per policy. Collaborate with interdisciplinary team and initiate plans and interventions as needed.   Outcome: Progressing  Turn pt, relieve pressure to bony prominences, keep skin clean and dry, avoid shearing and encourage use of moisturizer on skin.

## 2015-09-04 NOTE — Unmapped (Signed)
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 Presented to [facility] with abdominal distention and fevers.?? Identified as having pelvic and intraabdominal abscess - drained by IR?? - mixed enteric flora with no predominance.?? Readmitted to [facility] with bleeding for wound.?? Course complicated by ischemia to right foot..??     Subjective & Interval History;    Spent around 15 min face to face with her. RN and pt advocate were at the bedside.  D/w Psychologist as well.    Pt is non compliant with the treatment offered to her. She has been refusing PT/OT. Don't want to participate in wound care and dressing change.  Just want her IV Pain medication .  C/o that no one cares about her. Complaining that non one listen to her.    Tearful. I recommended to her that she should start taking antidepressant medication. She refused to take it.  Her main focus is to get IV Dilaudid. States that PO Oxycodone dont offer immediate pain control.      Breathing is OK.  No CP or fever.  Has abdominal pain, right leg pain and back pain.      Review of Systems:  10 point ROS was negative except as above.       Vital signs are stable .      PMH: Unchanged from H&P.   PSH: Unchanged from H&P.   Family medical history: Unchanged from H&P.   Social history: Unchanged from H&P      Scheduled Meds:  ??? clopidogrel  75 mg Per NG tube Daily 0900   ??? enoxaparin  40 mg Subcutaneous Daily 0900   ??? famotidine (PF)  20 mg Intravenous BID   ??? fentaNYL  1 patch Transdermal Q72H   ??? gabapentin  150 mg Oral 3 times per day   ??? insulin regular  0-5 Units Subcutaneous Daily 0900   ??? metoprolol tartrate  25 mg Oral BID   ??? oxyCODONE  15 mg Oral Q4H2 SCH     Continuous Infusions:  ??? TPN ADULT ([facility]) 69.8 mL/hr at 09/03/15 1752     PRN Meds:ALPRAZolam, alteplase, dextrose, dextrose 50 % in water (D50W), diclofenac  sodium, heparin lock flush, HYDROmorphone, ipratropium-albuterol, LORazepam, ondansetron    Objective:    Vital signs in last 24 hours:  Temp:  [98.3 ??F (36.8 ??C)-99.4 ??F (37.4 ??C)] 98.3 ??F (36.8 ??C)  Heart Rate:  [122-137] 137  Resp:  [18-20] 20  BP: (99-103)/(61-72) 103/61 mmHg    Physical Examination:    GENERAL: The patient is somnolent ,not in acute cardiorespiratory distress. Looking chronically sick.   HEENT:Atraumatic.Normocephalic. PERRLA, EOMI. No icterus, no pallor, no jugular venous distention..No ear or nose discharge.  NECK: Supple. Trachea is in midline. No Thyromegaly  CHEST: Symmetric. Clear to auscultation, bilateral equal air entry. No wheezing or rhonchi.  CARDIOVASCULAR: S1, S2, Regular pulse. No murmur or gallops. No JVD.  ABDOMEN: Whole anterior abdomen is covered with pouch. Brownish fistula output noted.Bilateral nephrostomy tubes.  EXTREMITIES: There is + BLE edema . Very tender to touch both legs.  Right foot has dressing in place. Right toes are gangrenous.   Skin;No bruise,No rash.No petechiae.  CNS;  No focal neurological deficit.       Intake/Output last 3 shifts:  I/O last 3 completed shifts:  In: 1675 [I.V.:1675]  Out: 1535 [Urine:385; Drains:1150]    Recent Labs;                           Lab name 08/29/15  0648 08/31/15  0459 09/02/15  0359   WBC 11.2* 7.4 8.7   HEMOGLOBIN 7.8* 7.7* 7.6*   HEMATOCRIT 25.5* 24.8* 23.8*   MEAN CORPUSCULAR VOLUME 91.7 90.9 89.9   PLATELETS 573* 559* 575*                                                            Lab name 08/26/15  0521 08/29/15  0648 09/02/15  0359   SODIUM 138 137 137   POTASSIUM 4.5 4.3 4.5   CREATININE 0.50* 0.60 0.54*   BUN 26* 33* 30*   CHLORIDE 103 104 104   CO2 27 26 28    PHOSPHORUS 3.8 3.7 4.1   MAGNESIUM 1.7 2.0 2.0                         Lab name 08/12/15  0521 08/19/15  0520 08/26/15  0521 09/02/15  0359   AST 14 23 23 14    ALT 15 26 17 17    BILIRUBIN TOTAL 0.2 0.3 0.3 0.2       Lab name 07/05/15  0702  07/09/15  0544   08/26/15  0521 08/29/15  0648 09/02/15  0359   PREALBUMIN 14.0*  --  16.0*  --   --   --   --    ALBUMIN  --   < > 1.5*  < > <1.5*   <1.5* <1.5* <1.5*   <1.5*   < > = values in this interval not displayed.                                                                                                   Lab name 08/29/15  0648 09/02/15  0359   CALCIUM 7.9* 7.9*   PHOSPHORUS 3.7 4.1                          Lab name 08/26/15  0521 09/02/15  0359   TRIGLYCERIDES 96 89                        Lab name 08/05/15  0900 08/07/15  0850 08/10/15  0830   VANCOMYCIN TROUGH 9.3* 9.1* 17.4       CT abdomen 3/24  1. Multiple abscess, large pelvic abscess with surgical drain,  right issue rectal fossa.  2. 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  3. Granulation tissue enhancement or blood extravasation in the  lower abdominal wall as described  4. Proximity of the small bowel vasculature at the  level of the  open wound on the right, side of active bleeding  5. Lower lobe bronchopneumonia and aspiration is also considered    Diet;    Diet Orders          TPN ADULT ([facility]) at 69.8 mL/hr starting at 05/23 1800    Diet NPO effective now Except for: except ice chips, except sips of clears starting at 03/28 0959            Assessment/Plan:      1. Peritoneal abscess- Mixed flora, pigtail drain 06/26/15 . Vanco/Zosyn/Diflucan.           -Completed  6 week Abx on 08/12/15.      2. Enterocutaneous Fistula- wound care, bowel rest. Still has significant output.    3. Severe intractable acute on chronic pain syndrome- receiving Dilaudid IV every 3 hr  Prn,Fentanyl patch decreased to 25  from 50  mcg. Started PO Oxycodone scheduled as she had better response with it previously. Added Gabapentin as she was on previously.         Consulted Psychiatry for depression, chronic pain and recommended SNRIs to help with pain and depression.She want to try therapy first.  Will gradually decrease  the IV dilaudid dose. Decreased to 3 mg IV Q 4 hr  On 5/22. Refer to pain management. Clinic.     4. Thrombocytosis. uncertain etiology- follow closely. Likely reactive.    5. Cachexia-   continue TPN, recheck PAB. Albumin stays less than 1.5.    6. Sinus tachycardia. Likely related to pain. Monitor.Added Metoprolol with holding parameter.     7. Ischemic feet/arterial thromboemboli- The patient had a clot in her femoral artery right leg that was cleared by vascular surgery.  However the patient ended up with necrosis at her distal digits which may be a combination of lack of blood flow and the use of pressors. Refusing heparin, aspirin and Plavix. Refused all heparin injections previously as well. Canceled aspirin but continue to encourage Plavix..           Following podiatrist at [facility].    8. History of colon cancer s/p exenteration with bilateral nephrostomy tubes    9. Recurrent presacral abscesses. Completed the course of Abx.    10. Debility- PT/OT    11. Guarded prognosis.     12. On TPN. 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 Cant tolerate PO. Weekly LFT while on TPN. TPN being managed by pharmacy. Electrolytes replacement through TPN. LFT are WNL so far.Mg being replaced through TPN.    13. D/C planning . Family meeting held on  5/3.  Pt need extensive wound care, TPN, IV Pain meds which are hurdle to discharge.    14. Nausea.  Zofran.    15.  New JP drain was placed on 5/16. F/u IR for drain management.    A/P reviewed and updated.  F/u CBC,BMP tomorrow. Ordered.  Encouraged her to take her medication regularly. Need to participate in PT/OT.       Prophylaxis;  GI Prophylaxis: Pepcid  DVT Prophylaxis: Lovenox     Code Status: Full Code      Daevon Holdren MD.       09/04/2015

## 2015-09-05 LAB — CBC
Hematocrit: 25 % — ABNORMAL LOW (ref 35.0–45.0)
Hemoglobin: 7.9 g/dL — ABNORMAL LOW (ref 11.7–15.5)
MCH: 28.3 pg (ref 27.0–33.0)
MCHC: 31.8 g/dL — ABNORMAL LOW (ref 32.0–36.0)
MCV: 89.1 fL (ref 80.0–100.0)
MPV: 6.9 fL — ABNORMAL LOW (ref 7.5–11.5)
Platelets: 534 10E3/uL — ABNORMAL HIGH (ref 140–400)
RBC: 2.8 10E6/uL — ABNORMAL LOW (ref 3.80–5.10)
RDW: 16.8 % — ABNORMAL HIGH (ref 11.0–15.0)
WBC: 8.7 10E3/uL (ref 3.8–10.8)

## 2015-09-05 LAB — RENAL FUNCTION PANEL W/EGFR
Albumin: <1.5 g/dL (ref 3.5–5.7)
Anion Gap: 6 mmol/L (ref 3–16)
BUN: 33 mg/dL (ref 7–25)
CO2: 28 mmol/L (ref 21–33)
Calcium: 8 mg/dL (ref 8.6–10.3)
Chloride: 102 mmol/L (ref 98–110)
Creatinine: 0.59 mg/dL (ref 0.60–1.30)
Glucose: 88 mg/dL (ref 70–100)
Osmolality, Calculated: 289 mOsm/kg (ref 278–305)
Phosphorus: 3.9 mg/dL (ref 2.1–4.7)
Potassium: 4.7 mmol/L (ref 3.5–5.3)
Sodium: 136 mmol/L (ref 133–146)
eGFR AA CKD-EPI: >90 See note.
eGFR NONAA CKD-EPI: >90 See note.

## 2015-09-05 LAB — MAGNESIUM: Magnesium: 2.1 mg/dL (ref 1.5–2.5)

## 2015-09-05 LAB — POC GLU MONITORING DEVICE: POC Glucose Monitoring Device: 115 mg/dL — ABNORMAL HIGH (ref 70–100)

## 2015-09-05 MED ORDER — metoprolol tartrate (LOPRESSOR) tablet 50 mg
50 | Freq: Two times a day (BID) | ORAL | Status: AC
Start: 2015-09-05 — End: 2015-09-13

## 2015-09-05 MED ORDER — sodium chloride 0.9 % infusion
INTRAVENOUS | Status: AC
Start: 2015-09-05 — End: 2015-09-07
  Administered 2015-09-05: 19:00:00 50 mL/h via INTRAVENOUS

## 2015-09-05 MED ORDER — TPN ADULT (WCH/Drake)
INTRAVENOUS | Status: AC
Start: 2015-09-05 — End: 2015-09-06
  Administered 2015-09-05: 21:00:00 via INTRAVENOUS

## 2015-09-05 MED FILL — OXYCODONE 20 MG/ML ORAL CONCENTRATE: 20 20 mg/mL | ORAL | Qty: 1

## 2015-09-05 MED FILL — HYDROMORPHONE 2 MG/ML INJECTION SYRINGE: 2 2 mg/mL | INTRAMUSCULAR | Qty: 2

## 2015-09-05 MED FILL — GABAPENTIN 250 MG/5 ML ORAL SOLUTION: 50 50 mg/mL | ORAL | Qty: 5

## 2015-09-05 MED FILL — MIRTAZAPINE 15 MG DISINTEGRATING TABLET: 15 15 MG | ORAL | Qty: 1

## 2015-09-05 MED FILL — TRAVASOL 10 % INTRAVENOUS SOLUTION: 10 10 % | INTRAVENOUS | Qty: 799

## 2015-09-05 MED FILL — ENOXAPARIN 40 MG/0.4 ML SUBCUTANEOUS SYRINGE: 40 40 mg/0.4 mL | SUBCUTANEOUS | Qty: 0.4

## 2015-09-05 MED FILL — ONDANSETRON HCL (PF) 4 MG/2 ML INJECTION SOLUTION: 4 4 mg/2 mL | INTRAMUSCULAR | Qty: 2

## 2015-09-05 MED FILL — CLOPIDOGREL 75 MG TABLET: 75 75 mg | ORAL | Qty: 1

## 2015-09-05 MED FILL — METOPROLOL TARTRATE 25 MG TABLET: 25 25 MG | ORAL | Qty: 1

## 2015-09-05 MED FILL — FAMOTIDINE (PF) 20 MG/2 ML INTRAVENOUS SOLUTION: 20 20 mg/2 mL | INTRAVENOUS | Qty: 2

## 2015-09-05 MED FILL — METOPROLOL TARTRATE 50 MG TABLET: 50 50 MG | ORAL | Qty: 1

## 2015-09-05 NOTE — Unmapped (Addendum)
I received a phone call from patient's sister April who wants to know if her sister can be discharged on June 6 as she is currently off of work and that she is planing to pick her up at 2:00pm. April would like for her prescriptions set to the [facility] on [address], [place], [place] [address]  [phone]. April is going to bring her son so that she can help Sabeen get into the car. April reports that Chirsty does not have a PCP and that she would like for her to see Dr. Bunnie Domino if he is accepting new patients.

## 2015-09-05 NOTE — Unmapped (Signed)
Problem: Fall Prevention  Goal: Patient will remain free of falls  Assess and monitor vitals signs, neurological status including level of consciousness and orientation. Reassess fall risk per hospital policy.    Ensure arm band on, uncluttered walking paths in room, adequate room lighting, call light and overbed table within reach, bed in low position, wheels locked, side rails up per policy, and non-skid footwear provided.   Outcome: Progressing  Fall precautions maintained, call light within reach, patient utilizes call light to ask for assistance as needed.

## 2015-09-05 NOTE — Unmapped (Signed)
Problem: Knowledge Deficit  Goal: Patient/family/caregiver demonstrates understanding of disease process, treatment plan, medications, and discharge instructions  Complete learning assessment and assess knowledge base.   Intervention: Provide teaching at level of understanding  Paient given teaching about pain medication dosage and administration frequency; education given following patient's constant request for more pain medication before the time allowable for the next prn dose

## 2015-09-05 NOTE — Unmapped (Signed)
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   Adequate intake via PO and TPN to support healing of areas on skin as diagnosis allows.   Outcome: Progressing  Weight stable from last week at 95 lb  Tolerates TPN without complications.   BUN elevated - consider increase in total volume TPN to 1800 ml  Blood sugars in adequate control   LFT WNL x Alk phos elevated but stable  Albumin remains low - likely r/t chronic inflammation. rec increase AA to 86/day   abdominal wounds  improved. Right foot vascular wounds no significant change, Right toes slight improvement,  Right lateral thigh previously resolved now reopened  Suggest increase in TE to 2 ml/day

## 2015-09-05 NOTE — Unmapped (Signed)
I received a phone call from [facility] at [employer] and she told me that the patient has a $6700.00 out of pocket and that she has meet $6300.00 and then she will covered at 100%. [facility] will rerun the patient's benefits once the discharge gets closer. They believe that Vinisha will be covered at 100% and will not have a co-pay by June 7. If she does end up it would be around 75.00.  I updated patient's sister on the above mention cost of the TPN.

## 2015-09-05 NOTE — Unmapped (Signed)
PICC RN NOTE:    Recommendation and order obtained to remove non tunneled temporary IJ TLC that has been in place since admission, placed at end of March at outside facility.  Patient has been on TPN and multiple IV antibiotics simultaneously which required her to have additional access, however now that her antibiotic course has been completed, it would be appropriate to remove her TLC with risk of CLABSI with non tunneled non cuffed CVC. Her port can be accessed for her TPN and an extended dwell placement for her other IV medications for pain and nausea. I spoke with the patient today regarding this information, explained the risk/benefit of keeping her TLC in place versus removing it and utilizing other IV access options as stated above. We discussed the risk of CLABSI and how long her TLC has been in place.  Patient said she was too nervous to do anything right now. She would like to wait. Dr. Cloretta Ned was notified of patients request. Will follow up with patient next week per her request to evaluate if she is ready at that time.

## 2015-09-05 NOTE — Unmapped (Signed)
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 Presented to [facility] with abdominal distention and fevers.?? Identified as having pelvic and intraabdominal abscess - drained by IR?? - mixed enteric flora with no predominance.?? Readmitted to [facility] with bleeding for wound.?? Course complicated by ischemia to right foot..??     Subjective & Interval History;    Pt examined at the bedside.  Looking better today. States that she took her all medication today.  Pain is under better control today.  No new complains.    Family meeting held yesterday.  All interval notes reviewed.  Psychology f/u appreciated. Dr. Minerva Ends recommended Remeron for her. Pt was wiling to take it after a long discussion with Dr. Minerva Ends.    Pt packs her fistula by herself now.   Mild tachycardia noted.     Review of Systems:  10 point ROS was negative except as above.       Vital signs are stable .      PMH: Unchanged from H&P.   PSH: Unchanged from H&P.   Family medical history: Unchanged from H&P.   Social history: Unchanged from H&P      Scheduled Meds:  ??? clopidogrel  75 mg Per NG tube Daily 0900   ??? enoxaparin  40 mg Subcutaneous Daily 0900   ??? famotidine (PF)  20 mg Intravenous BID   ??? fentaNYL  1 patch Transdermal Q72H   ??? gabapentin  150 mg Oral 3 times per day   ??? insulin regular  0-5 Units Subcutaneous Daily 0900   ??? metoprolol tartrate  50 mg Oral BID   ??? mirtazapine  15 mg Oral Nightly (2100)   ??? oxyCODONE  15 mg Oral Q4H2 SCH     Continuous Infusions:  ??? TPN ADULT ([facility]) 69.8 mL/hr at 09/04/15 1718   ??? TPN ADULT ([facility])       PRN Meds:ALPRAZolam, alteplase, dextrose, dextrose 50 % in water (D50W), diclofenac sodium, heparin lock flush, HYDROmorphone, ipratropium-albuterol, LORazepam, ondansetron    Objective:    Vital signs in last 24 hours:  Temp:  [98.8 ??F (37.1 ??C)-99.2 ??F (37.3 ??C)] 98.8 ??F  (37.1 ??C)  Heart Rate:  [127-133] 127  Resp:  [22-24] 24  BP: (100-119)/(54-65) 119/54 mmHg    Physical Examination:    GENERAL: The patient is somnolent ,not in acute cardiorespiratory distress. Looking chronically sick.   HEENT:Atraumatic.Normocephalic. PERRLA, EOMI. No icterus, no pallor, no jugular venous distention..No ear or nose discharge.  NECK: Supple. Trachea is in midline. No Thyromegaly  CHEST: Symmetric. Clear to auscultation, bilateral equal air entry. No wheezing or rhonchi.  CARDIOVASCULAR: S1, S2, Regular pulse. No murmur or gallops. No JVD.  ABDOMEN: Whole anterior abdomen is covered with pouch. Brownish fistula output noted.Bilateral nephrostomy tubes.  EXTREMITIES: There is + BLE edema . Very tender to touch both legs.  Right foot has dressing in place.   Skin;No bruise,No rash.No petechiae.  CNS;  No focal neurological deficit.       Intake/Output last 3 shifts:  I/O last 3 completed shifts:  In: 1602.8   Out: 650 [Urine:250; Drains:400]    Recent Labs;                           Lab name 08/31/15  0459 09/02/15  0359 09/05/15  0643   WBC 7.4 8.7 8.7   HEMOGLOBIN 7.7* 7.6* 7.9*   HEMATOCRIT 24.8* 23.8* 25.0*   MEAN CORPUSCULAR VOLUME 90.9 89.9 89.1   PLATELETS 559* 575* 534*                                                            Lab name 08/29/15  0648 09/02/15  0359 09/05/15  0643   SODIUM 137 137 136   POTASSIUM 4.3 4.5 4.7   CREATININE 0.60 0.54* 0.59*   BUN 33* 30* 33*   CHLORIDE 104 104 102   CO2 26 28 28    PHOSPHORUS 3.7 4.1 3.9   MAGNESIUM 2.0 2.0 2.1                         Lab name 08/12/15  0521 08/19/15  0520 08/26/15  0521 09/02/15  0359   AST 14 23 23 14    ALT 15 26 17 17    BILIRUBIN TOTAL 0.2 0.3 0.3 0.2       Lab name 07/05/15  0702  07/09/15  0544  08/29/15  0648 09/02/15  0359 09/05/15  0643   PREALBUMIN 14.0*  --  16.0*  --   --   --   --    ALBUMIN  --   < > 1.5*  < > <1.5* <1.5*   <1.5* <1.5*   < > = values in this interval not displayed.                                                                                                    Lab name 09/02/15  0359 09/05/15  0643   CALCIUM 7.9* 8.0*   PHOSPHORUS 4.1 3.9                          Lab name 08/26/15  0521 09/02/15  0359   TRIGLYCERIDES 96 89                        Lab name 08/05/15  0900 08/07/15  0850 08/10/15  0830   VANCOMYCIN TROUGH 9.3* 9.1* 17.4       CT abdomen 3/24  1. Multiple abscess, large pelvic abscess with surgical drain,  right issue rectal fossa.  2. 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  3. Granulation tissue enhancement or blood extravasation in the  lower abdominal wall as described  4. Proximity of the small bowel vasculature at the level of the  open wound on the right, side of active bleeding  5. Lower lobe bronchopneumonia and aspiration is also considered    Diet;    Diet Orders          TPN ADULT ([facility]) at 69.8  mL/hr starting at 05/25 1800    TPN ADULT ([facility]) at 69.8 mL/hr starting at 05/24 1800    Diet NPO effective now Except for: except ice chips, except sips of clears starting at 03/28 0959            Assessment/Plan:      1. Peritoneal abscess- Mixed flora, pigtail drain 06/26/15 . Vanco/Zosyn/Diflucan.           -Completed  6 week Abx on 08/12/15.      2. Enterocutaneous Fistula- wound care, bowel rest. Still has significant output.    3. Severe intractable acute on chronic pain syndrome- receiving Dilaudid IV every 3 hr  Prn,Fentanyl patch decreased to 25  from 50  mcg. Started PO Oxycodone scheduled as she had better response with it previously. Added Gabapentin as she was on previously.         Will gradually decrease the IV dilaudid dose. Decreased to 3 mg IV Q 4 hr  On 5/22. Refer to pain management. Clinic.         For depression, Remeron was added on 5/14. Will monitor her behavior. Psychology f/u appreciated.     4. Thrombocytosis. uncertain etiology- follow closely. Likely reactive.    5. Cachexia-   continue TPN, recheck  PAB. Albumin stays less than 1.5.    6. Sinus tachycardia. Likely related to pain. Monitor.Increased Metoprolol with holding parameter. Will give gentle IVF today.     7. Ischemic feet/arterial thromboemboli- The patient had a clot in her femoral artery right leg that was cleared by vascular surgery.  However the patient ended up with necrosis at her distal digits which may be a combination of lack of blood flow and the use of pressors. Refusing heparin, aspirin and Plavix. Refused all heparin injections previously as well. Canceled aspirin but continue to encourage Plavix..           Following podiatrist at [facility]. Getting better.     8. History of colon cancer s/p exenteration with bilateral nephrostomy tubes    9. Recurrent presacral abscesses. Completed the course of Abx.    10. Debility- PT/OT    11. Guarded prognosis.     12. On TPN. 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 Cant tolerate PO. Weekly LFT while on TPN. TPN being managed by pharmacy. Electrolytes replacement through TPN. LFT are WNL so far.Mg being replaced through TPN.    13. D/C planning . Family meeting held on  5/24. [name]  Pt need extensive wound care, TPN, IV Pain meds which are hurdle to discharge.        Goal is set to d/c her with home care in first week of June.     14. Nausea.  Zofran.    15.  New JP drain was placed on 5/16. F/u IR for drain management as OP.       A/P reviewed and updated.  Recent labs reviewed.  IVF ordered because of tachycardia and mildly elevated BUN.   Will monitor her behavior closely after starting on Mirtazapine.   Pt has TLC in place for more than one month now.Refused to d/c it. She has Port A cath as well.,Will get midline for regular IV medication and Port for TPN.     Prophylaxis;  GI Prophylaxis: Pepcid  DVT Prophylaxis: Lovenox     Code Status: Full Code      Kamen Hanken MD.       09/05/2015

## 2015-09-05 NOTE — Unmapped (Signed)
Vitals signs stable No acute distress noted PRN pain meds given with positive effects noted, patient refused oxycodone, x 2 doses despite encouragement, refused all oral meds except evening dose of oxycodone. Patient refused care at times. Fall precautions maintained, call light within reach, patient utilizes call light to ask for assistance as needed.  Arm band is secure, uncluttered walking pathways and adequate room lighting is maintained.  Call light and bed side table are within reach.  Bed is in low position, wheels locked, side rails per policy.  Non skid footwear is provided. Tolerating TPN without difficulty, CBG as rx'd, hung and verified with another RN as rx'd. IV fluids started and continued. Patient refused to allow help with dressing changes, patient changed dressing per self  .

## 2015-09-05 NOTE — Unmapped (Signed)
Pt laying in bed wet not sure why because she won't allow for nurse to assess or for pca to clean up bed and get her clean. Will continue to monitor.

## 2015-09-05 NOTE — Unmapped (Addendum)
[name]  Nutrition Progress Note    Nutrition Diagnosis:   1. Altered GI function - Continues    New Nutrition Diagnosis:  No    Diet: NPO with ice chips and sips of clears.   TPN: Total Volume 1653ml/day rate 69.8 ml/hr:  AAcids 80g/day, Dextrose 293g/day, Lipid  35.2g/d per day to provide kcal 1669     Weight: (!) 95 lb 1 oz (43.12 kg) (09/02/15 0805)     Skin Condition:   Left lateral abdomen fistula improved.  Mid abdominal wound with fistula improved. Right foot vascular wounds no significant change, Right toes slight improvement,  Right lateral thigh previously resolved now reopened.  Edema: bilateral LE nonpitting    Labs:   Lab Results   Component Value Date    PREALBUMIN 16.0* 07/09/2015     Lab Results   Component Value Date    ALBUMIN <1.5* 08/26/2015     Lab Results   Component Value Date    ALT 17 09/02/2015    AST 14 09/02/2015    ALKPHOS 187* 09/02/2015    BILITOT 0.2 09/02/2015     Lab Results   Component Value Date    TRIG 89 09/02/2015     Lab Results   Component Value Date    CREATININE 0.59* 09/05/2015    BUN 33* 09/05/2015    NA 136 09/05/2015    K 4.7 09/05/2015    CL 102 09/05/2015    CO2 28 09/05/2015     Lab Results   Component Value Date    OSMOLALITY 289 09/05/2015     Lab Results   Component Value Date    CALCIUM 8.0* 09/05/2015    PHOS 3.9 09/05/2015    GLUCOSE 88 09/05/2015        Lab Results   Component Value Date    WBC 8.7 09/05/2015    HGB 7.9* 09/05/2015    HCT 25.0* 09/05/2015    MCV 89.1 09/05/2015    PLT 534* 09/05/2015         Component Value Date/Time    POCGMD 115* 09/05/2015 0945    POCGMD 139* 09/04/2015 1150    POCGMD 120* 09/03/2015 0756    POCGMD 120* 09/01/2015 0947    POCGMD 116* 08/31/2015 0014     No results found for: VITD25H      Scheduled Meds:  ??? clopidogrel  75 mg Per NG tube Daily 0900   ??? enoxaparin  40 mg Subcutaneous Daily 0900   ??? famotidine (PF)  20 mg Intravenous BID   ??? fentaNYL  1 patch Transdermal Q72H   ??? gabapentin  150 mg Oral 3 times  per day   ??? insulin regular  0-5 Units Subcutaneous Daily 0900   ??? metoprolol tartrate  50 mg Oral BID   ??? mirtazapine  15 mg Oral Nightly (2100)   ??? oxyCODONE  15 mg Oral Q4H2 SCH     Continuous Infusions:  ??? sodium chloride 50 mL/hr (09/05/15 1457)   ??? TPN ADULT ([facility])       PRN Meds:.ALPRAZolam, alteplase, dextrose, dextrose 50 % in water (D50W), diclofenac sodium, heparin lock flush, HYDROmorphone, ipratropium-albuterol, LORazepam, ondansetron    Note: Weight stable from last week. TPN infusing without any complications. BUN remains elevated. Serum Na and osmolality WNL.  Alk Phos stable from last week.  AST/ALT WNL. TBili and Triglycerides WNL.  Blood sugars in adequate control. Ascorbic acid added to each bag r/t GI output and wound  healing. TPN provides 1.9 g/kg protein. Albumin remains low at <1.5 likely r/t chronic inflammation. No new CRP level. Appears to have less po intake of ice chips and clears.     Nutrition Prescription:   Food and Nutrition Therapy: Suggest increase AA to 90 g/day and total volume to 1800 ml. Discuss with Pharmacy in AM.   Monitoring: weights, hydration,  Labs, skin condition    Plan of Care:Update/revised    Follow Up: Other: 7-14 days    Mackey Birchwood RDN, LD  Pager [phone] Phone [phone]    09/05/2015 5:20 PM

## 2015-09-05 NOTE — Unmapped (Signed)
Clinical Pharmacy Note - TPN Order    Linda Ponce is a 45 y.o. female that is being followed by the clinical pharmacy department for monitoring and adjustment of TPN.    Patient continues NPO with ice chips, sips of clear liquids    Plan:    -adjusted Calcium around 10; albumin < 1.5  -BUN slightly elevated at 33  -continue current continuous TPN-no changes  -formulation listed below       TPN Electrolytes  ??Sodium Chloride: 80 mEq  ??Sodium Acetate: 0 mEq  ??Sodium Phosphate: 0 mmol  ??Magnesium Sulfate: 20 mEq  ??Calcium Gluconate: 9.6 mEq  ??Potassium Chloride: 20 mEq mEq  ??Potassium Phosphate:18 mmol?? TPN Macronutrients  ??Volume: 1675 mL  ??Rate: 69.8 mL/hr  ??Amino Acids: 79.9 grams/day (47.7 g/L) ??  ??Dextrose: 293.3 grams/day (175 g/L) ??  ??Lipids: 35.19 grams/day?? (21 g/L) ?? TPN Additives  ??MVI 10 mL  ??TE 1 mL  ??Ascorbic acid 200 mg??                   SODIUM   Date Value Ref Range Status   09/05/2015 136 133 - 146 mmol/L Final     CHLORIDE   Date Value Ref Range Status   09/05/2015 102 98 - 110 mmol/L Final     CO2   Date Value Ref Range Status   09/05/2015 28 21 - 33 mmol/L Final     POTASSIUM   Date Value Ref Range Status   09/05/2015 4.7 3.5 - 5.3 mmol/L Final     PHOSPHORUS   Date Value Ref Range Status   09/05/2015 3.9 2.1 - 4.7 mg/dL Final     CALCIUM   Date Value Ref Range Status   09/05/2015 8.0* 8.6 - 10.3 mg/dL Final     GLUCOSE   Date Value Ref Range Status   09/05/2015 88 70 - 100 mg/dL Final       Harim Bi E Bailen Geffre RPH 09/05/2015 9:55 AM

## 2015-09-06 LAB — POC GLU MONITORING DEVICE: POC Glucose Monitoring Device: 103 mg/dL (ref 70–100)

## 2015-09-06 MED ORDER — TPN ADULT (WCH/Drake)
30 | INTRAVENOUS | Status: AC
Start: 2015-09-06 — End: 2015-09-07
  Administered 2015-09-06: 21:00:00 via INTRAVENOUS

## 2015-09-06 MED ORDER — HYDROmorphone (DILAUDID) injection Syrg 2 mg
2 | INTRAMUSCULAR | Status: AC | PRN
Start: 2015-09-06 — End: 2015-09-11
  Administered 2015-09-06 – 2015-09-11 (×27): 2 mg via INTRAVENOUS

## 2015-09-06 MED FILL — ONDANSETRON HCL (PF) 4 MG/2 ML INJECTION SOLUTION: 4 4 mg/2 mL | INTRAMUSCULAR | Qty: 2

## 2015-09-06 MED FILL — GABAPENTIN 250 MG/5 ML ORAL SOLUTION: 50 50 mg/mL | ORAL | Qty: 5

## 2015-09-06 MED FILL — HYDROMORPHONE 2 MG/ML INJECTION SYRINGE: 2 2 mg/mL | INTRAMUSCULAR | Qty: 2

## 2015-09-06 MED FILL — HYDROMORPHONE 2 MG/ML INJECTION SYRINGE: 2 2 mg/mL | INTRAMUSCULAR | Qty: 1

## 2015-09-06 MED FILL — OXYCODONE 20 MG/ML ORAL CONCENTRATE: 20 20 mg/mL | ORAL | Qty: 1

## 2015-09-06 MED FILL — FAMOTIDINE (PF) 20 MG/2 ML INTRAVENOUS SOLUTION: 20 20 mg/2 mL | INTRAVENOUS | Qty: 2

## 2015-09-06 MED FILL — TRAVASOL 10 % INTRAVENOUS SOLUTION: 10 10 % | INTRAVENOUS | Qty: 860

## 2015-09-06 NOTE — Unmapped (Signed)
Occupational Therapy   Reason Patient Not Seen         Name: Linda Ponce  DOB: [birth year]  Attending Physician: Valda Lamb, MD  Admission Diagnosis: jewish 07/05/15 dx: sepsis  Date: 09/06/2015    Reviewed Pertinent hospital course: Yes    Unable to see patient due to : Pt refused OT services this date; crying stating,  You guys can't be doing this to me. I need time to plan and get ready, I wasn't ready for you. Encouraged pt, educated patient and reminded patient the importance/ necessity of getting up daily, moving self around for safe mobility at discharge. Pt agreed but again began crying. Calmed pt down and scheduled visit for Tues 5/30 so pt has ample time to mentally prepare. Will follow up next scheduled visit.    Cont per POC in colboration with OTR/L  [name], COTA/L

## 2015-09-06 NOTE — Unmapped (Signed)
Patient resting in bed talking with Dr. Minerva Ends when this writer entered the room; Pepcid 20 mg and Dilaudid 2 mg was administered @ 2046; patient requested to wait upon giving rest of the medication that is scheduled for later when pain is under control. Re-entered room at 2207 to give rest of the medications scheduled; patient declined all medication except for the Oxycodone 15 mg. Writer offered Zofran prior to oral medication as suggested by Dr. Minerva Ends; unsuccessful; Dr. Minerva Ends was made aware of patient's refusal; patient refused repositioning; assessment of dressing and drains at this time; call light in reach; continues on TPN and IV fluid therapy; visual checks throughout the shift.

## 2015-09-06 NOTE — Unmapped (Signed)
Pt is refusing all medications except for pepcid and dilaudid iv.

## 2015-09-06 NOTE — Unmapped (Addendum)
Nursing Clinical Progress Note        Patient Active Problem List    Diagnosis Date Noted   ??? Adjustment disorder with mixed anxiety and depressed mood 08/06/2015   ??? Pain of multiple sites 08/06/2015   ??? Cognitive dysfunction-recent onset. 08/06/2015   ??? 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 Malnutrition 07/08/2015   ??? Fistula 07/08/2015   ??? Abdominal abscess 07/04/2015       Filed Vitals:    09/05/15 0534 09/05/15 1043 09/05/15 1700 09/05/15 2300   BP: 119/54 124/50 121/54 101/58   Pulse: 127 120 125 115   Temp: 98.8 ??F (37.1 ??C) 98 ??F (36.7 ??C) 98.3 ??F (36.8 ??C) 98.3 ??F (36.8 ??C)   TempSrc: Oral Oral Oral Axillary   Resp: 24 21 20 20    Height:       Weight:       SpO2: 98% 97% 98% 100%       Lines/Drains/Access - Yes   If yes, please list  rt ij tlc, foley cath, colostomy and abd fistulas    Does the patient have any changes to their condition warranting intervention? No       RN Shift Note Additional: pt is in no distress, respirations easy and unlabored.  Skin is warm, pink and dry/ pt is able to make needs known.     Does the patient's have any transfers/appointments or testing scheduled -   Future Appointments  Date Time Provider Department Center   09/24/2015 1:00 PM Harsh Devin Going, MD [facility]       Patient/Family questions - No    Mcarthur Rossetti, RN    Attempted to assess patient; pt refused assessment.

## 2015-09-06 NOTE — Unmapped (Signed)
Entered patient's room due patient agreed to take oral medication after speaking with Dr. Minerva Ends again; when this writer asked if patient was ready for oral medication; patient became agitated and stated she doesn't want to take the medication and wants to be left alone; I reminded her that she agreed taking the medication when talking to Dr. Minerva Ends; patient stated  She keeps pressuring me to take medication and I don't want her mad at me. This Clinical research associate explained her rights and explained rationale for the medication ordered. The patient was informed that she is not being forced to take anything she doesn't want but was made aware of the reasons behind the medication being prescribed; patient seemed to understand information that was being given; patient declined oral medication at this time; patient did request nausea medication; Zofran was administered at 2309; call light in reach; Dr. Minerva Ends aware of patient's statement.

## 2015-09-06 NOTE — Unmapped (Signed)
Pharmacy Nutrition Support Service    Linda Ponce is a 45 y.o. female on TPN.  Pharmacy was consulted for monitoring and adjustment.    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    Previous TPN Formula (4/21-5/25)   Based on current weight of 43.12 kg   Amino Acids: 47.7 g/L (80 grams, 1.86 grams/kg/day, 320 kcal)   Dextrose: 175 g/L (293 grams, 997 kcal)   Lipids: 21 g/L (35.2 grams, 352 kcal)   Total Daily Calories: 1669 kcal (38.7 kcal/kg/day)   Volume: 1675 mL   Rate: 69.8 mL/h    New TPN Formula (start 5/26)   Based on current weight of 43.12 kg   Amino Acids: 47.8 g/L (86 grams, 2 grams/kg/day, 344 kcal)   Dextrose: 162.8 g/L (293 grams, 997 kcal)   Lipids: 19.5 g/L (35.1 grams, 351 kcal)   Total Daily Calories: 1692 kcal (39.2 kcal/kg/day)   Volume: 1800 mL   Rate: 75 mL/h    Pt Weights:  43.12 kg (09/02/15)    40.8 kg (08/17/15)    40.9 kg (08/11/15)    40.8 kg (08/03/15)    38.7 kg (07/31/15)    39.8 kg (07/27/15)    42.8 kg (07/21/15)    TPN Medication Recent History (Show up to 1 orders; newest on the left.)     Start date and time   09/05/2015 1800      TPN ADULT ([facility]) [[id number]]    Order Status  Active    Last Given  09/05/2015 1729       Macro Ingredients    amino acid 10%  47.7 g/L    dextrose 70%  175 g/L       QS Base    sterile water  266.73 mL       Lipids    fat emulsion 30 %  21 g/L       TPN Electrolytes entered as PER DAY amount    sodium chloride  80 mEq    magnesium sulfate  20 mEq    calcium gluconate  9.6 mEq    potassium chloride  20 mEq    potassium phosphate  18 mmol       TPN Additives entered as PER DAY amount    MVI adult no. 1 with vitamin K  10 mL    ascorbic acid (vitamin C)  200 mg    trace elements (MULTITRACE-5)  1 mL       Calorie Contribution    Proteins  319.6 kcal    Dextrose  997.22 kcal    Lipids  351.9 kcal    Total  1,668.72 kcal       Electrolyte Ion Calculated Amount    Sodium  80 mEq    Potassium  46.4 mEq    Calcium  9.6 mEq     Magnesium  20 mEq    Aluminum  --    Phosphate  18 mmol    Chloride  100 mEq    Acetate  --       Other    Total Protein  79.9 g    Total Protein/kg  1.85 g/kg    Glucose Infusion Rate  4.73 mg/kg/min    Osmolarity  1,552.88    Volume  1,675 mL    Rate  69.8 mL/hr    Dosing Weight  43.1 kg    Infusion Site  Central        Diet Orders  TPN ADULT ([facility]) at 69.8 mL/hr starting at 05/25 1800    Diet NPO effective now Except for: except ice chips, except sips of clears starting at 03/28 0959          TPN Associated Medications:    - GI Prophylaxis: famotidine   - Antiemetic: ondansetron PRN   - Bowel Regimen: none   - Insulin Regimen: sliding scale regular insulin     Recent Labs:      Lab 09/05/15  0643 09/02/15  0359   SODIUM 136 137   POTASSIUM 4.7 4.5   CHLORIDE 102 104   CO2 28 28   BUN 33* 30*   CREATININE 0.59* 0.54*   GLUCOSE 88 109*   CALCIUM 8.0* 7.9*   PHOSPHORUS 3.9 4.1   MAGNESIUM 2.1 2.0   Free calcium (08/26/15): 4.9, goal 4.40 - 5.40 mg/dL      Lab 16/10/96  0454 09/02/15  0359   ALK PHOS  --  187*   ALT  --  17   AST  --  14   BILIRUBIN TOTAL  --  0.2   BILIRUBIN DIRECT  --  0.10   TOTAL PROTEIN  --  5.3*   ALBUMIN <1.5* <1.5*   <1.5*         Lab 09/02/15  0359   TRIGLYCERIDES 89         Assessment and Plan:    - Macronutrients: increase amino acids to 86 grams/day and increase total volume to 1800 mL as per dietary's recommendations.  - Electrolytes: Will slightly increase sodium chloride and reduce potassium chloride based on trends. Free calcium level is ordered with AM labs on 5/29 since albumin is < 1.5.  - Multivitamin/Trace Elements: provided daily in TPN.   - Glucose: has been WNL; no sliding scale insulin doses required since 4/14.  - Other additives: Pt has been receiving additional ascorbic acid 200 mg daily (400 mg total; 200 mg is also provided by multivitamin). Will increase ascorbic acid to provide 500 mg total. Will add zinc 5 mg (10 mg total; 5 mg is also provided by trace  elements) considering GI output and wound healing.  - Check labs twice weekly: Monday/Thursday.    Thank you for the consult.    Kristie Cowman, PharmD  09/06/2015 7:56 AM

## 2015-09-06 NOTE — Unmapped (Signed)
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 Presented to [facility] with abdominal distention and fevers.?? Identified as having pelvic and intraabdominal abscess - drained by IR?? - mixed enteric flora with no predominance.?? Readmitted to [facility] with bleeding for wound.?? Course complicated by ischemia to right foot..??     Subjective & Interval History;    C/o upset stomach.  Pepcid make her nauseous. She get IV Pepcid.  Also did not receive PO Oxycodone because she feel that she chokes on medication.  Getting IV Dilaudid for pain control.     Not looking in acute cardiorespiratory distress.  Pain seems under control.  Vital signs are stable  Except mild tachycardia which at at her baseline.       Review of Systems:  10 point ROS was negative except as above.       Vital signs are stable .      PMH: Unchanged from H&P.   PSH: Unchanged from H&P.   Family medical history: Unchanged from H&P.   Social history: Unchanged from H&P      Scheduled Meds:  ??? clopidogrel  75 mg Per NG tube Daily 0900   ??? enoxaparin  40 mg Subcutaneous Daily 0900   ??? famotidine (PF)  20 mg Intravenous BID   ??? fentaNYL  1 patch Transdermal Q72H   ??? gabapentin  150 mg Oral 3 times per day   ??? insulin regular  0-5 Units Subcutaneous Daily 0900   ??? metoprolol tartrate  50 mg Oral BID   ??? mirtazapine  15 mg Oral Nightly (2100)   ??? oxyCODONE  15 mg Oral Q4H2 SCH     Continuous Infusions:  ??? sodium chloride 50 mL/hr (09/05/15 1457)   ??? TPN ADULT ([facility]) 69.8 mL/hr at 09/05/15 1729     PRN Meds:ALPRAZolam, alteplase, dextrose, dextrose 50 % in water (D50W), diclofenac sodium, heparin lock flush, HYDROmorphone, ipratropium-albuterol, LORazepam, ondansetron    Objective:    Vital signs in last 24 hours:  Temp:  [98 ??F (36.7 ??C)-98.3 ??F (36.8 ??C)] 98.3 ??F (36.8 ??C)  Heart Rate:  [115-125] 115  Resp:  [20-21]  20  BP: (101-124)/(50-58) 101/58 mmHg    Physical Examination:    GENERAL: The patient is somnolent ,not in acute cardiorespiratory distress. Looking chronically sick.   HEENT:Atraumatic.Normocephalic. PERRLA, EOMI. No icterus, no pallor, no jugular venous distention..No ear or nose discharge.  NECK: Supple. Trachea is in midline. No Thyromegaly  CHEST: Symmetric. Clear to auscultation, bilateral equal air entry. No wheezing or rhonchi.  CARDIOVASCULAR: S1, S2, Regular pulse. No murmur or gallops. No JVD.  ABDOMEN: Whole anterior abdomen is covered with pouch. Brownish fistula output noted.Bilateral nephrostomy tubes.  EXTREMITIES: There is + BLE edema . Very tender to touch both legs.  Right foot has dressing in place.   Skin;No bruise,No rash.No petechiae.  CNS;  No focal neurological deficit.        Intake/Output last 3 shifts:  I/O last 3 completed shifts:  In: 1979 [I.V.:142]  Out: -     Recent Labs;                           Lab name 08/31/15  0459 09/02/15  0359 09/05/15  0643   WBC 7.4 8.7  8.7   HEMOGLOBIN 7.7* 7.6* 7.9*   HEMATOCRIT 24.8* 23.8* 25.0*   MEAN CORPUSCULAR VOLUME 90.9 89.9 89.1   PLATELETS 559* 575* 534*                                                            Lab name 08/29/15  0648 09/02/15  0359 09/05/15  0643   SODIUM 137 137 136   POTASSIUM 4.3 4.5 4.7   CREATININE 0.60 0.54* 0.59*   BUN 33* 30* 33*   CHLORIDE 104 104 102   CO2 26 28 28    PHOSPHORUS 3.7 4.1 3.9   MAGNESIUM 2.0 2.0 2.1                         Lab name 08/12/15  0521 08/19/15  0520 08/26/15  0521 09/02/15  0359   AST 14 23 23 14    ALT 15 26 17 17    BILIRUBIN TOTAL 0.2 0.3 0.3 0.2       Lab name 07/05/15  0702  07/09/15  0544  08/29/15  0648 09/02/15  0359 09/05/15  0643   PREALBUMIN 14.0*  --  16.0*  --   --   --   --    ALBUMIN  --   < > 1.5*  < > <1.5* <1.5*   <1.5* <1.5*   < > = values in this interval not displayed.                                                                                                   Lab  name 09/02/15  0359 09/05/15  0643   CALCIUM 7.9* 8.0*   PHOSPHORUS 4.1 3.9                          Lab name 08/26/15  0521 09/02/15  0359   TRIGLYCERIDES 96 89                        Lab name 08/05/15  0900 08/07/15  0850 08/10/15  0830   VANCOMYCIN TROUGH 9.3* 9.1* 17.4       CT abdomen 3/24  1. Multiple abscess, large pelvic abscess with surgical drain,  right issue rectal fossa.  2. 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  3. Granulation tissue enhancement or blood extravasation in the  lower abdominal wall as described  4. Proximity of the small bowel vasculature at the level of the  open wound on the right, side of active bleeding  5. Lower lobe bronchopneumonia and aspiration is also considered    Diet;    Diet Orders          TPN ADULT ([facility]) at 69.8 mL/hr starting at 05/25 1800    Diet NPO effective now  Except for: except ice chips, except sips of clears starting at 03/28 0959            Assessment/Plan:      1. Peritoneal abscess- Mixed flora, pigtail drain 06/26/15 . Vanco/Zosyn/Diflucan.           -Completed  6 week Abx on 08/12/15.      2. Enterocutaneous Fistula- wound care, bowel rest. Still has significant output.    3. Severe intractable acute on chronic pain syndrome- receiving Dilaudid IV every 3 hr  Prn,Fentanyl patch decreased to 25  from 50  mcg. Started PO Oxycodone scheduled as she had better response with it previously. Added Gabapentin as she was on previously.         Will gradually decrease the IV dilaudid dose. Decreased to 2 mg IV Q 4 hr  On 5/26 . Refer to pain management. Clinic.         For depression, Remeron was added on 5/24. Will monitor her behavior.     4. Thrombocytosis. uncertain etiology- follow closely. Likely reactive.     5. Cachexia-   continue TPN, recheck PAB. Albumin stays less than 1.5.    6. Sinus tachycardia. Likely related to pain. Monitor.Increased Metoprolol with holding parameter.     7. Ischemic  feet/arterial thromboemboli- The patient had a clot in her femoral artery right leg that was cleared by vascular surgery.  However the patient ended up with necrosis at her distal digits which may be a combination of lack of blood flow and the use of pressors. Refusing heparin, aspirin and Plavix. Refused all heparin injections previously as well. Canceled aspirin but continue to encourage Plavix..           Following podiatrist at [facility]. Getting better.     8. History of colon cancer s/p exenteration with bilateral nephrostomy tubes    9. Recurrent presacral abscesses. Completed the course of Abx.    10. Debility- PT/OT. Pt is not participating most of the times.   11. Dysphagia. Feel choking sensation on medication. SLP consulted.     12. On TPN. 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 Cant tolerate PO. Weekly LFT while on TPN. TPN being managed by pharmacy. Electrolytes replacement through TPN. LFT are WNL so far.Mg being replaced through TPN.    13. D/C planning . Family meeting held on  5/24. [name]  Pt need extensive wound care, TPN, IV Pain meds which are hurdle to discharge.        Goal is set to d/c her with home care in first week of June.     14. Nausea.  Zofran.     15.  New JP drain was placed on 5/16. F/u IR for drain management as OP.     A/P reviewed and updated.  F/u SLP.  Decreasing IV Dilaudid today to 2 mg iv. Goal is to get rid of IV narcotics before d/c .      Prophylaxis;  GI Prophylaxis: Pepcid  DVT Prophylaxis: Lovenox     Code Status: Full Code      Mattis Featherly MD.       09/06/2015

## 2015-09-06 NOTE — Unmapped (Signed)
Pt's RN informed me of pt's perception of my reminder of her decision to take her medication (Remeron in particular) as pressure.  I had reinforced her cooperation with taking it previously, and she informed me of her intent to take her medications after her zofran began working. She then refused to take them, and I returned to her room to inquire about her changing her mind, and to re-educate her about the reasons that the medication was important for improving her coping and pain tolerance. I will follow up with her about this on Tuesday, reinforce her right to refuse and her responsibility for resulting consequences, and inform her that it would be better to be truthful about her intentions to prevent education about consequences that she then perceives as pressure.  Thank you!  --Maryan Char, PhD, Medical Psychology

## 2015-09-06 NOTE — Unmapped (Signed)
Problem: Potential for Compromised Skin Integrity  Goal: Skin integrity is maintained or improved  Assess and monitor skin integrity. Identify patients at risk for skin breakdown on admission and per policy. Collaborate with interdisciplinary team and initiate plans and interventions as needed.   Outcome: Progressing  Patient declined dressing, drains and ostomy to be assessed.

## 2015-09-06 NOTE — Unmapped (Signed)
Patient refused all her medications except dilaudid; she was supposed to take dilaudid after oxycodone failed to relieve pain but reported that oxycodone and other oral medications upset her stomach; dilaudid was given every four hours for pain level 9 and 10 on a scale from 0 to 10

## 2015-09-06 NOTE — Unmapped (Signed)
Problem: Daily Care  Goal: Daily care needs are met  Assess and monitor ability to perform self care and identify potential discharge needs.   Outcome: Not Progressing  Pt needs encouragement to provide/participate in self care. Pt frequently refuses medications by mouth; prefers iv pain medication.

## 2015-09-06 NOTE — Unmapped (Signed)
[facility] Inpatient Medical Psychology Service  [place] Department of Neurology & Rehabilitation Medicine  Division of Neuropsychology & Medical Psychology     INPATIENT MEDICAL PSYCHOLOGY PROGRESS NOTE    Patient Name:  Linda Ponce          DOB:  1970-07-16  Admit Date:  07/08/2015   CSN:  [id number]    Referring Provider:  Valda Lamb, MD  Location:  [name] Room [id number]     Current Date: 09/06/2015  Time and Duration of Service: 8:30p-9:10p = 40 minutes face to face time    CPT Code:  57846 - Health & Behavior Intervention - Individual - 3 units, 40 min face to face time.    Current Problem List:      Patient Active Problem List   Diagnosis   ??? Abdominal abscess   ??? Enterocutaneous fistula   ??? Malnutrition   ??? Fistula   ??? Adjustment disorder with mixed anxiety and depressed mood   ??? Pain of multiple sites   ??? Cognitive dysfunction-recent onset.       Current Medications:     Scheduled Medications:    ??? clopidogrel  75 mg Per NG tube Daily 0900   ??? enoxaparin  40 mg Subcutaneous Daily 0900   ??? famotidine (PF)  20 mg Intravenous BID   ??? fentaNYL  1 patch Transdermal Q72H   ??? gabapentin  150 mg Oral 3 times per day   ??? insulin regular  0-5 Units Subcutaneous Daily 0900   ??? metoprolol tartrate  50 mg Oral BID   ??? mirtazapine  15 mg Oral Nightly (2100)   ??? oxyCODONE  15 mg Oral Q4H2 SCH     PRN Medications:  ALPRAZolam, alteplase, dextrose, dextrose 50 % in water (D50W), diclofenac sodium, heparin lock flush, HYDROmorphone, ipratropium-albuterol, LORazepam, ondansetron      Objective Presentation/Subjective Concerns and Related Interventions:      ?? Pt reports today was a 'horrible' day due to oxycodone hurting her stomach (she had been taking it previously), and then refusing her am dose because it was late (but she hadn't had any since midnight due to refusals) and she wanted dilaudid. She has refused it all day per RN notes in [facility]. Pt blames refusals  on nausea or stomach pain with oral meds, and also RN allegely delaying dilaudid, but oxycodone refusals are documented and are likely the primary reason for her misery today.  ?? Dilaudid now reduced to 2 mg q4h as of today.  ?? CM informed me that she has 2 weeks to get off IV dilaudid and start doing therapies in prep for going to sisters home or to a SNF for rehab.    ?? Interventions:   ?? Provided feedback to pt re: her documented refusals and their primary role in her constant pain today. Supportively informed her that her complaints of liquid oxycodone hurting her stomach will not result in increasing the IV dilaudid, especially since she has been taking oxycodone for several weeks without complaint. She acknowledged this information without argument.  ?? Discussed the possibility that she may not feel that oxycodone benefits her because the dose may need to be increased, and that I would ask her MD about this.   ?? Reinforced willingness to take Remeron for depression, anxiety, and improved pain tolerance.   ?? Also recommended that she ask for zofran 30 min prior to taking oral medications to reduce nausea  from them. She agreed, but had previously--she seems to forget. Informed her RN about trying this and passing it on in report to other RNs caring for her.    Psychiatric Diagnoses:      ?? Adjustment disorder with mixed anxiety and depressed mood.  ?? Related to prolonged hospitalization and inadequately controlled pain in abdomen, back, and legs.  ?? She has had NO prior treatment for anxiety or depression or substance use disorders.   ?? Started taking Remeron Soltab mg qhs on 09/05/15.    ?? Pain of Multiple Sites.    ?? 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 Pt reports severe pain ranging from  8-10/10 even with fentanyl 125 mcg patch. Dilaudid IV q3h prn reduces it to 5/10 but does not last.  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 No history of narcotic abuse, but she has been on narcotics for abdominal and radiation pain from 2011 cancer surgery and complications x 6 yrs. Best regimen per pt post op at [facility] was oxycodone 15 mg q4h scheduled and dilaudid 1 mg q3h prn which she tapered down before discharge.She also took neurontin 300 mg TID when home prior to surgery for neuropathic pain (low back).  Psychosocial losses (death of father 1 week before surgery, missing teen son who is in [place] w/her mother) reduce ability to cope with pain and hospitalization.     ?? Cognitive dysfunction--recent onset.    ?? Likely residual of significant delirium at [facility], pain medications, distraction from pain itself, and possibly IV ABX. Will monotor. Primarily involves orientation to time, and somewhat to place, short term recall, and working memory. Expected to improve as pt's wounds and infections improve.  ?? MMSE-2 score of 21/20 (T=21) indicates moderate cognitive impairment involving orientation, short term recall, and working memory.   ?? Clinical assessment of common sense reasoning skills indicated normal limits reasoning skills except when her pain gets severe, at which point she becomes fearful and suspicious about medications being withheld because she bother's the nurses too much.      NURSING:     ?? [name] has decided that she wants to change her fistula bags herself as she has been doing it x 8 yrs as her prior hospitals required it.. She just wants you to bring her the supplies, washcloths, and trash bag she needs to do it. Please allow this if medically OK as her anxiety is far less and she feels more in control of her care. We need to foster this and more.    RECOMMENDATIONS -- DR Cloretta Ned:    ?? Pt refused all oral oxycodone today; I  recommend not allowing this to alter your dilaudid reduction plan.     ?? However, it may be necessary to increase oxycodone dose, given reduction in fentanyl to 25 mcg patch and dilaudid to 2 mg q4h prn. Realistically, she will likely adapt to not having dilaudid if the oxycodone worked better. (Holding further reductions until Monday may also help).    Med Psychology Plan:    ?? I will see her on Monday to monitor response to Remeron, pain control, and to continue attempts to teach pain management coping skills.        Thank you for allowing me to participate  in the care of your patient.    Nadir Vasques P. Minerva Ends, PhD  Licensed Insurance risk surveyor in Medical Psychology  [place] Department of Neurology & Rehabilitation Medicine  Division of Neuropsychology & Medical Psychology  Office Phone for non-urgent messages/requests:  [phone]  Pager--urgent issues only: [phone]    09/06/2015

## 2015-09-07 LAB — POC GLU MONITORING DEVICE: POC Glucose Monitoring Device: 109 mg/dL (ref 70–100)

## 2015-09-07 MED ORDER — TPN ADULT (WCH/Drake)
INTRAVENOUS | Status: AC
Start: 2015-09-07 — End: 2015-09-08
  Administered 2015-09-07: 21:00:00 via INTRAVENOUS

## 2015-09-07 MED FILL — FENTANYL 25 MCG/HR TRANSDERMAL PATCH: 25 25 mcg/hr | TRANSDERMAL | Qty: 1

## 2015-09-07 MED FILL — GABAPENTIN 250 MG/5 ML ORAL SOLUTION: 50 50 mg/mL | ORAL | Qty: 5

## 2015-09-07 MED FILL — HYDROMORPHONE 2 MG/ML INJECTION SYRINGE: 2 2 mg/mL | INTRAMUSCULAR | Qty: 1

## 2015-09-07 MED FILL — TRAVASOL 10 % INTRAVENOUS SOLUTION: 10 10 % | INTRAVENOUS | Qty: 860

## 2015-09-07 MED FILL — OXYCODONE 20 MG/ML ORAL CONCENTRATE: 20 20 mg/mL | ORAL | Qty: 1

## 2015-09-07 MED FILL — FAMOTIDINE (PF) 20 MG/2 ML INTRAVENOUS SOLUTION: 20 20 mg/2 mL | INTRAVENOUS | Qty: 2

## 2015-09-07 NOTE — Unmapped (Signed)
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 Presented to [facility] with abdominal distention and fevers.?? Identified as having pelvic and intraabdominal abscess - drained by IR?? - mixed enteric flora with no predominance.?? Readmitted to [facility] with bleeding for wound.?? Course complicated by ischemia to right foot..??     Subjective & Interval History;    Looking withdrawn.  States that pain is under control.  No new complains.    Pt not very cooperative in providing ROS.     Review of Systems:  10 point ROS was negative except as above.       Vital signs are stable .      PMH: Unchanged from H&P.   PSH: Unchanged from H&P.   Family medical history: Unchanged from H&P.   Social history: Unchanged from H&P      Scheduled Meds:  ??? clopidogrel  75 mg Per NG tube Daily 0900   ??? enoxaparin  40 mg Subcutaneous Daily 0900   ??? famotidine (PF)  20 mg Intravenous BID   ??? fentaNYL  1 patch Transdermal Q72H   ??? gabapentin  150 mg Oral 3 times per day   ??? insulin regular  0-5 Units Subcutaneous Daily 0900   ??? metoprolol tartrate  50 mg Oral BID   ??? mirtazapine  15 mg Oral Nightly (2100)   ??? oxyCODONE  15 mg Oral Q4H2 SCH     Continuous Infusions:  ??? sodium chloride 50 mL/hr (09/05/15 1457)   ??? TPN ADULT ([facility]) 75 mL/hr at 09/06/15 1700   ??? TPN ADULT ([facility])       PRN Meds:ALPRAZolam, alteplase, dextrose, dextrose 50 % in water (D50W), diclofenac sodium, heparin lock flush, HYDROmorphone, ipratropium-albuterol, LORazepam, ondansetron    Objective:    Vital signs in last 24 hours:  Temp:  [98.5 ??F (36.9 ??C)] 98.5 ??F (36.9 ??C)  Heart Rate:  [115-121] 121  Resp:  [18] 18  BP: (95-101)/(53-61) 101/61 mmHg    Physical Examination:    GENERAL: The patient is somnolent ,not in acute cardiorespiratory distress. Looking chronically sick.   HEENT:Atraumatic.Normocephalic. PERRLA, EOMI. No  icterus, no pallor, no jugular venous distention..No ear or nose discharge.  NECK: Supple. Trachea is in midline. No Thyromegaly  CHEST: Symmetric. Clear to auscultation, bilateral equal air entry. No wheezing or rhonchi.  CARDIOVASCULAR: S1, S2, Regular pulse. No murmur or gallops. No JVD.  ABDOMEN: Whole anterior abdomen is covered with pouch  EXTREMITIES: There is + BLE edema . Right foot has dressing in place.   Skin;No bruise,No rash.No petechiae.  CNS;  No focal neurological deficit.        Intake/Output last 3 shifts:  I/O last 3 completed shifts:  In: 4456 [I.V.:1821]  Out: 550 [Urine:100; Drains:450]    Recent Labs;                           Lab name 08/31/15  0459 09/02/15  0359 09/05/15  0643   WBC 7.4 8.7 8.7   HEMOGLOBIN 7.7* 7.6* 7.9*   HEMATOCRIT 24.8* 23.8* 25.0*   MEAN CORPUSCULAR VOLUME 90.9 89.9 89.1   PLATELETS 559* 575* 534*  Lab name 08/29/15  0648 09/02/15  0359 09/05/15  0643   SODIUM 137 137 136   POTASSIUM 4.3 4.5 4.7   CREATININE 0.60 0.54* 0.59*   BUN 33* 30* 33*   CHLORIDE 104 104 102   CO2 26 28 28    PHOSPHORUS 3.7 4.1 3.9   MAGNESIUM 2.0 2.0 2.1                         Lab name 08/12/15  0521 08/19/15  0520 08/26/15  0521 09/02/15  0359   AST 14 23 23 14    ALT 15 26 17 17    BILIRUBIN TOTAL 0.2 0.3 0.3 0.2       Lab name 07/05/15  0702  07/09/15  0544  08/29/15  0648 09/02/15  0359 09/05/15  0643   PREALBUMIN 14.0*  --  16.0*  --   --   --   --    ALBUMIN  --   < > 1.5*  < > <1.5* <1.5*   <1.5* <1.5*   < > = values in this interval not displayed.                                                                                                   Lab name 09/02/15  0359 09/05/15  0643   CALCIUM 7.9* 8.0*   PHOSPHORUS 4.1 3.9                          Lab name 08/26/15  0521 09/02/15  0359   TRIGLYCERIDES 96 89                        Lab name 08/05/15  0900 08/07/15  0850 08/10/15  0830   VANCOMYCIN TROUGH 9.3* 9.1* 17.4       CT  abdomen 3/24  1. Multiple abscess, large pelvic abscess with surgical drain,  right issue rectal fossa.  2. 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  3. Granulation tissue enhancement or blood extravasation in the  lower abdominal wall as described  4. Proximity of the small bowel vasculature at the level of the  open wound on the right, side of active bleeding  5. Lower lobe bronchopneumonia and aspiration is also considered    Diet;    Diet Orders          TPN ADULT ([facility]) at 75 mL/hr starting at 05/27 1800    TPN ADULT ([facility]) at 75 mL/hr starting at 05/26 1800    Diet NPO effective now Except for: except ice chips, except sips of clears starting at 03/28 0959            Assessment/Plan:      1. Peritoneal abscess- Mixed flora, pigtail drain 06/26/15 . Vanco/Zosyn/Diflucan.           -Completed  6 week Abx on 08/12/15.      2. Enterocutaneous Fistula- wound care, bowel rest. Still has significant output.  3. Severe intractable acute on chronic pain syndrome- receiving Dilaudid IV every 3 hr  Prn,Fentanyl patch decreased to 25  from 50  mcg. Started PO Oxycodone scheduled as she had better response with it previously. Added Gabapentin as she was on previously.         Will gradually decrease the IV dilaudid dose. Decreased to 2 mg IV Q 4 hr  On 5/26 . Refer to pain management. Clinic.         For depression, Remeron was added on 5/24. Will monitor her behavior. Pt is non compliant with most of the treatment offered to her.     4. Thrombocytosis. uncertain etiology- follow closely. Likely reactive.     5. Cachexia-   continue TPN, recheck PAB. Albumin stays less than 1.5.    6. Sinus tachycardia. Likely related to pain. Monitor.Contine  Metoprolol with holding parameter.     7. Ischemic feet/arterial thromboemboli- The patient had a clot in her femoral artery right leg that was cleared by vascular surgery.  However the patient ended up with necrosis  at her distal digits which may be a combination of lack of blood flow and the use of pressors. Refusing heparin, aspirin and Plavix. Refused all heparin injections previously as well. Canceled aspirin but continue to encourage Plavix..           Following podiatrist at [facility]. Getting better.     8. History of colon cancer s/p exenteration with bilateral nephrostomy tubes    9. Recurrent presacral abscesses. Completed the course of Abx.    10. Debility- PT/OT. Pt is not participating most of the times.   11. Dysphagia. Feel choking sensation on medication. SLP consulted.     12. On TPN. 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 Cant tolerate PO. Weekly LFT while on TPN. TPN being managed by pharmacy. Electrolytes replacement through TPN. LFT are WNL so far.Mg being replaced through TPN.    13. D/C planning . Family meeting held on  5/24. [name]  Pt need extensive wound care, TPN, IV Pain meds which are hurdle to discharge.        Goal is set to d/c her with home care in first week of June.     14. Nausea.  Zofran.     15.  New JP drain was placed on 5/16. F/u IR for drain management as OP.         All interval notes reviewed.  Psychology input appreciated.  Will keep decreasing the IV Dilaudid gradually.  F/u SLP.  A/P reviewed and updated.      Prophylaxis;  GI Prophylaxis: Pepcid  DVT Prophylaxis: Lovenox     Code Status: Full Code      [place] Lapaglia MD.       09/07/2015

## 2015-09-07 NOTE — Unmapped (Addendum)
Patient received Dilaudid 2 mg @ 0533 per patient's request; patient declined to be repositioned; dressing and drains assessed at this time; patient informed that dressing and care has to be provided at least once this shift; patient aware that staff will approach subject again before the end of the shift; continues on TPN and IV Fluid therapy; call light in reach.

## 2015-09-07 NOTE — Unmapped (Signed)
Problem: Knowledge Deficit  Goal: Patient/family/caregiver demonstrates understanding of disease process, treatment plan, medications, and discharge instructions  Complete learning assessment and assess knowledge base.   Educate to pain maagement

## 2015-09-07 NOTE — Unmapped (Signed)
Clinical Pharmacy Note (TPN Dosing)    No labs to review today.      Reviewed chart for any dietary recommendations and other problems.    No changes to TPN    Linda Ponce E Murray Guzzetta  09/07/2015  8:35 AM

## 2015-09-07 NOTE — Unmapped (Signed)
Problem: Compromised Skin Integrity  Goal: STG - Patient will maintain good skin integrity  Outcome: Not Progressing  Patient continues to declined dressing to be assessed; will continue to encourage patient to allow staff to provide care; patient is aware of the rationale; also declined to be repositioned.

## 2015-09-07 NOTE — Unmapped (Signed)
Patient declined repositioned; assessment of dressings and drains; also declined oral medication; patient requested Dilaudid; 2 mg was administered at 0050; blood pressure was 101/61 pulse 121.

## 2015-09-07 NOTE — Unmapped (Signed)
Assessment completed call light withi reach continues tpn prn pain management

## 2015-09-08 LAB — POC GLU MONITORING DEVICE: POC Glucose Monitoring Device: 115 mg/dL — ABNORMAL HIGH (ref 70–100)

## 2015-09-08 MED ORDER — TPN ADULT (WCH/Drake)
4 | INTRAVENOUS | Status: AC
Start: 2015-09-08 — End: 2015-09-09
  Administered 2015-09-08: 21:00:00 via INTRAVENOUS

## 2015-09-08 MED FILL — OXYCODONE 20 MG/ML ORAL CONCENTRATE: 20 20 mg/mL | ORAL | Qty: 1

## 2015-09-08 MED FILL — FAMOTIDINE (PF) 20 MG/2 ML INTRAVENOUS SOLUTION: 20 20 mg/2 mL | INTRAVENOUS | Qty: 2

## 2015-09-08 MED FILL — HYDROMORPHONE 2 MG/ML INJECTION SYRINGE: 2 2 mg/mL | INTRAMUSCULAR | Qty: 1

## 2015-09-08 MED FILL — GABAPENTIN 250 MG/5 ML ORAL SOLUTION: 50 50 mg/mL | ORAL | Qty: 5

## 2015-09-08 MED FILL — METOPROLOL TARTRATE 50 MG TABLET: 50 50 MG | ORAL | Qty: 1

## 2015-09-08 MED FILL — ONDANSETRON HCL (PF) 4 MG/2 ML INJECTION SOLUTION: 4 4 mg/2 mL | INTRAMUSCULAR | Qty: 2

## 2015-09-08 MED FILL — TRAVASOL 10 % INTRAVENOUS SOLUTION: 10 10 % | INTRAVENOUS | Qty: 860

## 2015-09-08 NOTE — Unmapped (Signed)
Patient resting in bed; declines care to be provided at this time; declined oral medication except for Oxycodone; Zofran given per patient's request; effective; patient refuses to be repositioned; call light in reach; remains on TPN therapy; visual checks during the shift.

## 2015-09-08 NOTE — Unmapped (Signed)
Patient resting in bed; continues on TPN therapy; prn medication given per patient's request; effective; patient has declined to be repositioned; drains and dressings to be assessed; will continue to encourage; call light in reach; visual checks throughout the shift.

## 2015-09-08 NOTE — Unmapped (Signed)
Problem: Potential for Compromised Skin Integrity  Goal: Skin integrity is maintained or improved  Assess and monitor skin integrity. Identify patients at risk for skin breakdown on admission and per policy. Collaborate with interdisciplinary team and initiate plans and interventions as needed.   Outcome: Not Progressing  Patient refused skin care and assessment of drains and dressing; patient has been explained the rationale and risk for complications

## 2015-09-08 NOTE — Unmapped (Signed)
Patient refused to be repositioned; to have assessment of dressings and drains throughout the shift until 0500; patient allowed staff to change dressings and checks drains at this time; patient sheets were soiled with copious amount of drainage due to patient refusing assessments of sites; talked with patient about rationale of skin care and risk of increased skin breakdown; redness noted in right groin and right flank to mid back; cleansed skin with soap and water and applied dry dressing; patient continues to not understand the importance of preventing additional skin breakdown; staff needs to continue to encourage patient to allow care; call light remains in reach; on-coming shift aware of patient's status.

## 2015-09-08 NOTE — Unmapped (Signed)
Assessment completed call light within reach  Continue on tpn pain m,angemnet

## 2015-09-08 NOTE — Unmapped (Signed)
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 Presented to [facility] with abdominal distention and fevers.?? Identified as having pelvic and intraabdominal abscess - drained by IR?? - mixed enteric flora with no predominance.?? Readmitted to [facility] with bleeding for wound.?? Course complicated by ischemia to right foot..??     Subjective & Interval History;    Pain is under better control today.  No new event  was noted.  EC pouch was leaking again.  Pt looks withdrawn.  No new complains.    Review of Systems:  10 point ROS was negative except as above.         PMH: Unchanged from H&P.   PSH: Unchanged from H&P.   Family medical history: Unchanged from H&P.   Social history: Unchanged from H&P      Scheduled Meds:  ??? clopidogrel  75 mg Per NG tube Daily 0900   ??? enoxaparin  40 mg Subcutaneous Daily 0900   ??? famotidine (PF)  20 mg Intravenous BID   ??? fentaNYL  1 patch Transdermal Q72H   ??? gabapentin  150 mg Oral 3 times per day   ??? insulin regular  0-5 Units Subcutaneous Daily 0900   ??? metoprolol tartrate  50 mg Oral BID   ??? mirtazapine  15 mg Oral Nightly (2100)   ??? oxyCODONE  15 mg Oral Q4H2 SCH     Continuous Infusions:  ??? TPN ADULT ([facility]) 75 mL/hr at 09/07/15 1712   ??? TPN ADULT ([facility])       PRN Meds:ALPRAZolam, alteplase, dextrose, dextrose 50 % in water (D50W), diclofenac sodium, heparin lock flush, HYDROmorphone, ipratropium-albuterol, LORazepam, ondansetron    Objective:    Vital signs in last 24 hours:  Temp:  [98.7 ??F (37.1 ??C)-99.3 ??F (37.4 ??C)] 99.3 ??F (37.4 ??C)  Heart Rate:  [82-122] 82  Resp:  [18-20] 18  BP: (102-111)/(52-62) 111/62 mmHg    Physical Examination:    GENERAL: The patient is somnolent ,not in acute cardiorespiratory distress. Looking chronically sick.   HEENT:Atraumatic.Normocephalic. PERRLA, EOMI. No icterus, no pallor, no jugular venous  distention..No ear or nose discharge.  NECK: Supple. Trachea is in midline. No Thyromegaly  CHEST: Symmetric. Clear to auscultation, bilateral equal air entry. No wheezing or rhonchi.  CARDIOVASCULAR: S1, S2, Regular pulse. No murmur or gallops. No JVD.  ABDOMEN: Whole anterior abdomen is covered with pouch  EXTREMITIES: There is + BLE edema . Right foot has dressing in place.   Skin;No bruise,No rash.No petechiae.  CNS;  No focal neurological deficit.        Intake/Output last 3 shifts:  I/O last 3 completed shifts:  In: 5159 [I.V.:3221]  Out: 550 [Urine:100; Drains:450]    Recent Labs;                           Lab name 08/31/15  0459 09/02/15  0359 09/05/15  0643   WBC 7.4 8.7 8.7   HEMOGLOBIN 7.7* 7.6* 7.9*   HEMATOCRIT 24.8* 23.8* 25.0*   MEAN CORPUSCULAR VOLUME 90.9 89.9 89.1   PLATELETS 559* 575* 534*  Lab name 08/29/15  0648 09/02/15  0359 09/05/15  0643   SODIUM 137 137 136   POTASSIUM 4.3 4.5 4.7   CREATININE 0.60 0.54* 0.59*   BUN 33* 30* 33*   CHLORIDE 104 104 102   CO2 26 28 28    PHOSPHORUS 3.7 4.1 3.9   MAGNESIUM 2.0 2.0 2.1                         Lab name 08/12/15  0521 08/19/15  0520 08/26/15  0521 09/02/15  0359   AST 14 23 23 14    ALT 15 26 17 17    BILIRUBIN TOTAL 0.2 0.3 0.3 0.2       Lab name 07/05/15  0702  07/09/15  0544  08/29/15  0648 09/02/15  0359 09/05/15  0643   PREALBUMIN 14.0*  --  16.0*  --   --   --   --    ALBUMIN  --   < > 1.5*  < > <1.5* <1.5*   <1.5* <1.5*   < > = values in this interval not displayed.                                                                                                   Lab name 09/02/15  0359 09/05/15  0643   CALCIUM 7.9* 8.0*   PHOSPHORUS 4.1 3.9                          Lab name 08/26/15  0521 09/02/15  0359   TRIGLYCERIDES 96 89                        Lab name 08/05/15  0900 08/07/15  0850 08/10/15  0830   VANCOMYCIN TROUGH 9.3* 9.1* 17.4       CT abdomen 3/24  1. Multiple abscess, large  pelvic abscess with surgical drain,  right issue rectal fossa.  2. 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  3. Granulation tissue enhancement or blood extravasation in the  lower abdominal wall as described  4. Proximity of the small bowel vasculature at the level of the  open wound on the right, side of active bleeding  5. Lower lobe bronchopneumonia and aspiration is also considered    Diet;    Diet Orders          TPN ADULT ([facility]) at 75 mL/hr starting at 05/28 1800    TPN ADULT ([facility]) at 75 mL/hr starting at 05/27 1800    Diet NPO effective now Except for: except ice chips, except sips of clears starting at 03/28 0959            Assessment/Plan:      1. Peritoneal abscess- Mixed flora, pigtail drain 06/26/15 . Vanco/Zosyn/Diflucan.           -Completed  6 week Abx on 08/12/15.      2. Enterocutaneous Fistula- wound care, bowel rest. Still has significant output.  3. Severe intractable acute on chronic pain syndrome- receiving Dilaudid IV every 3 hr  Prn,Fentanyl patch decreased to 25  from 50  mcg. Started PO Oxycodone scheduled as she had better response with it previously. Added Gabapentin as she was on previously.         Will gradually decrease the IV dilaudid dose. Decreased to 2 mg IV Q 4 hr  On 5/26 . Refer to pain management. Clinic.         For depression, Remeron was added on 5/24. Will monitor her behavior. Pt is non compliant with most of the treatment offered to her.     4. Thrombocytosis. uncertain etiology- follow closely. Likely reactive.     5. Cachexia-   continue TPN, recheck PAB. Albumin stays less than 1.5.    6. Sinus tachycardia. Likely related to pain. Monitor.Contine  Metoprolol with holding parameter.     7. Ischemic feet/arterial thromboemboli- The patient had a clot in her femoral artery right leg that was cleared by vascular surgery.  However the patient ended up with necrosis at her distal digits which may be a  combination of lack of blood flow and the use of pressors. Refusing heparin, aspirin and Plavix. Refused all heparin injections previously as well. Canceled aspirin but continue to encourage Plavix..           Following podiatrist at [facility]. Getting better.     8. History of colon cancer s/p exenteration with bilateral nephrostomy tubes    9. Recurrent presacral abscesses. Completed the course of Abx.    10. Debility- PT/OT. Pt is not participating most of the times.   11. Dysphagia. Feel choking sensation on medication. SLP consulted.     12. On TPN. 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 Cant tolerate PO. Weekly LFT while on TPN. TPN being managed by pharmacy. Electrolytes replacement through TPN. LFT are WNL so far.Mg being replaced through TPN.    13. D/C planning . Family meeting held on  5/24. [name]  Pt need extensive wound care, TPN, IV Pain meds which are hurdle to discharge.        Goal is set to d/c her with home care in first week of June.     14. Nausea.  Zofran.     15.  New JP drain was placed on 5/16. F/u IR for drain management as OP. Appointment with Dr. Rushie Chestnut on 6/6.         All interval notes reviewed.Will continue current treatment plan .    F/u CBC,BMP tomorrow. Ordered.      Prophylaxis;  GI Prophylaxis: Pepcid  DVT Prophylaxis: Lovenox     Code Status: Full Code      Kadience Macchi MD.       09/08/2015

## 2015-09-08 NOTE — Unmapped (Signed)
Clinical Pharmacy Note - TPN Order    Linda Ponce is a 45 y.o. female that is being followed by the clinical pharmacy department for monitoring and adjustment of TPN.    Plan   1. No new labs today or dietary changes  2. Reorder current TPN    Pharmacy will continue to monitor and adjust     Thank you for the consult    Leonette Nutting PharmD, RPh

## 2015-09-08 NOTE — Unmapped (Signed)
Problem: Knowledge Deficit  Goal: Patient/family/caregiver demonstrates understanding of disease process, treatment plan, medications, and discharge instructions  Complete learning assessment and assess knowledge base.   Educate pain management

## 2015-09-09 LAB — RENAL FUNCTION PANEL W/EGFR
Albumin: <1.5 g/dL — ABNORMAL LOW (ref 3.5–5.7)
Anion Gap: 7 mmol/L (ref 3–16)
BUN: 32 mg/dL — ABNORMAL HIGH (ref 7–25)
CO2: 26 mmol/L (ref 21–33)
Calcium: 8 mg/dL — ABNORMAL LOW (ref 8.6–10.3)
Chloride: 105 mmol/L (ref 98–110)
Creatinine: 0.58 mg/dL — ABNORMAL LOW (ref 0.60–1.30)
Glucose: 116 mg/dL — ABNORMAL HIGH (ref 70–100)
Osmolality, Calculated: 294 mosm/kg (ref 278–305)
Phosphorus: 3.9 mg/dL (ref 2.1–4.7)
Potassium: 4.2 mmol/L (ref 3.5–5.3)
Sodium: 138 mmol/L (ref 133–146)
eGFR AA CKD-EPI: >90 See note.
eGFR NONAA CKD-EPI: >90 See note.

## 2015-09-09 LAB — CBC
Hematocrit: 23 % — ABNORMAL LOW (ref 35.0–45.0)
Hemoglobin: 7.1 g/dL — ABNORMAL LOW (ref 11.7–15.5)
MCH: 27.6 pg (ref 27.0–33.0)
MCHC: 30.7 g/dL — ABNORMAL LOW (ref 32.0–36.0)
MCV: 90 fL (ref 80.0–100.0)
MPV: 6.8 fL — ABNORMAL LOW (ref 7.5–11.5)
Platelets: 521 10E3/uL — ABNORMAL HIGH (ref 140–400)
RBC: 2.56 10E6/uL — ABNORMAL LOW (ref 3.80–5.10)
RDW: 16.5 % — ABNORMAL HIGH (ref 11.0–15.0)
WBC: 6.2 10E3/uL (ref 3.8–10.8)

## 2015-09-09 LAB — HEPATIC FUNCTION PANEL
ALT: 16 U/L (ref 7–52)
AST: 14 U/L (ref 13–39)
Albumin: <1.5 g/dL — ABNORMAL LOW (ref 3.5–5.7)
Alkaline Phosphatase: 206 U/L — ABNORMAL HIGH (ref 36–125)
Bilirubin, Direct: 0.12 mg/dL (ref 0.00–0.40)
Bilirubin, Indirect: 0.08 mg/dL (ref 0.00–1.10)
Total Bilirubin: 0.2 mg/dL (ref 0.0–1.5)
Total Protein: 5.6 g/dL — ABNORMAL LOW (ref 6.4–8.9)

## 2015-09-09 LAB — MAGNESIUM: Magnesium: 2 mg/dL (ref 1.5–2.5)

## 2015-09-09 LAB — TRIGLYCERIDES: Triglycerides: 72 mg/dL (ref 10–149)

## 2015-09-09 LAB — CALCIUM FREE, SERUM: Free Calcium, Ser: 4.73 mg/dL (ref 4.40–5.40)

## 2015-09-09 MED ORDER — TPN ADULT (WCH/Drake)
1 | INTRAVENOUS | Status: AC
Start: 2015-09-09 — End: 2015-09-10
  Administered 2015-09-09: 22:00:00 via INTRAVENOUS

## 2015-09-09 MED FILL — FAMOTIDINE (PF) 20 MG/2 ML INTRAVENOUS SOLUTION: 20 20 mg/2 mL | INTRAVENOUS | Qty: 2

## 2015-09-09 MED FILL — METOPROLOL TARTRATE 50 MG TABLET: 50 50 MG | ORAL | Qty: 1

## 2015-09-09 MED FILL — OXYCODONE 20 MG/ML ORAL CONCENTRATE: 20 20 mg/mL | ORAL | Qty: 1

## 2015-09-09 MED FILL — ONDANSETRON HCL (PF) 4 MG/2 ML INJECTION SOLUTION: 4 4 mg/2 mL | INTRAMUSCULAR | Qty: 2

## 2015-09-09 MED FILL — HYDROMORPHONE 2 MG/ML INJECTION SYRINGE: 2 2 mg/mL | INTRAMUSCULAR | Qty: 1

## 2015-09-09 MED FILL — ENOXAPARIN 40 MG/0.4 ML SUBCUTANEOUS SYRINGE: 40 40 mg/0.4 mL | SUBCUTANEOUS | Qty: 0.4

## 2015-09-09 MED FILL — MIRTAZAPINE 15 MG DISINTEGRATING TABLET: 15 15 MG | ORAL | Qty: 1

## 2015-09-09 MED FILL — GABAPENTIN 250 MG/5 ML ORAL SOLUTION: 50 50 mg/mL | ORAL | Qty: 5

## 2015-09-09 MED FILL — CLOPIDOGREL 75 MG TABLET: 75 75 mg | ORAL | Qty: 1

## 2015-09-09 MED FILL — TRAVASOL 10 % INTRAVENOUS SOLUTION: 10 10 % | INTRAVENOUS | Qty: 860

## 2015-09-09 NOTE — Unmapped (Signed)
RN Clinical Progress Note      Patient comes to [facility] for the following reasons: jewish 07/05/15 dx: sepsis    Patient Active Problem List    Diagnosis Date Noted   ??? Adjustment disorder with mixed anxiety and depressed mood 08/06/2015   ??? Pain of multiple sites 08/06/2015   ??? Cognitive dysfunction-recent onset. 08/06/2015   ??? 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 Malnutrition 07/08/2015   ??? Fistula 07/08/2015   ??? Abdominal abscess 07/04/2015       Filed Vitals:    09/08/15 0600 09/08/15 0701 09/09/15 0449 09/09/15 1748   BP: 111/62  106/64 103/66   Pulse: 82  126 115   Temp: 99.3 ??F (37.4 ??C)  98.8 ??F (37.1 ??C) 98.5 ??F (36.9 ??C)   TempSrc: Oral  Oral    Resp: 18  18 18    Height:       Weight:  88 lb 3 oz (40.002 kg)     SpO2: 100%  100% 98%       Lines/Drains/Access - Yes   If yes, please list  R T IJ    Does the patient have any changes to their condition warranting intervention? No       Does the patient's have any transfers/appointments or testing scheduled -   Future Appointments  Date Time Provider Department Center   09/24/2015 1:00 PM Harsh Devin Going, MD [facility]       Patient/Family questions - No    RN Shift Note Additional:  Patient Linda Ponce is a 45 y.o. female that is alert and oriented x4. Patient is able to make needs knon. Assessment completed and documented. Patient tolerated meds well. Wound care completed. Pt c/o of pain treated with PRN Dilaudid, see MAR. Pt is afebrile. Patient respiratory status constant. Patient pain level denies pain at this time. Patient stable. Pt currently resting in bed. Call light within reach.     [name], RN  09/09/2015 6:28 PM

## 2015-09-09 NOTE — Unmapped (Signed)
Problem: Fall Prevention  Goal: Patient will remain free of falls  Assess and monitor vitals signs, neurological status including level of consciousness and orientation. Reassess fall risk per hospital policy.    Ensure arm band on, uncluttered walking paths in room, adequate room lighting, call light and overbed table within reach, bed in low position, wheels locked, side rails up per policy, and non-skid footwear provided.   Outcome: Progressing  Patient has been free from falls during this shift. Call light within reach. Bed in low position. Door open.

## 2015-09-09 NOTE — Unmapped (Signed)
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 Presented to [facility] with abdominal distention and fevers.?? Identified as having pelvic and intraabdominal abscess - drained by IR?? - mixed enteric flora with no predominance.?? Readmitted to [facility] with bleeding for wound.?? Course complicated by ischemia to right foot..??     Subjective & Interval History;    Pt looking withdrawn and chronically sick.  No new complains.  Denies any fever,chill, headache,N/V, CP ,SOB,Cough, Palpitation. Denies any joint pain, skin rash,anxiety or depression.      Review of Systems:  10 point ROS was negative except as above.         PMH: Unchanged from H&P.   PSH: Unchanged from H&P.   Family medical history: Unchanged from H&P.   Social history: Unchanged from H&P      Scheduled Meds:  ??? clopidogrel  75 mg Per NG tube Daily 0900   ??? enoxaparin  40 mg Subcutaneous Daily 0900   ??? famotidine (PF)  20 mg Intravenous BID   ??? fentaNYL  1 patch Transdermal Q72H   ??? gabapentin  150 mg Oral 3 times per day   ??? insulin regular  0-5 Units Subcutaneous Daily 0900   ??? metoprolol tartrate  50 mg Oral BID   ??? mirtazapine  15 mg Oral Nightly (2100)   ??? oxyCODONE  15 mg Oral Q4H2 SCH     Continuous Infusions:  ??? TPN ADULT ([facility]) 75 mL/hr at 09/08/15 1723   ??? TPN ADULT ([facility])       PRN Meds:ALPRAZolam, alteplase, dextrose, dextrose 50 % in water (D50W), diclofenac sodium, heparin lock flush, HYDROmorphone, ipratropium-albuterol, LORazepam, ondansetron    Objective:    Vital signs in last 24 hours:  Temp:  [98.8 ??F (37.1 ??C)] 98.8 ??F (37.1 ??C)  Heart Rate:  [126] 126  Resp:  [18] 18  BP: (106)/(64) 106/64 mmHg    Physical Examination:    GENERAL: The patient is somnolent ,not in acute cardiorespiratory distress. Looking chronically sick.   HEENT:Atraumatic.Normocephalic. PERRLA, EOMI. No icterus, no pallor,  no jugular venous distention..No ear or nose discharge.  NECK: Supple. Trachea is in midline. No Thyromegaly  CHEST: Symmetric. Clear to auscultation, bilateral equal air entry. No wheezing or rhonchi.  CARDIOVASCULAR: S1, S2, Regular pulse. No murmur or gallops. No JVD.  ABDOMEN: Whole anterior abdomen is covered with pouch  EXTREMITIES: There is + BLE edema . Right foot has dressing in place.   Skin;No bruise,No rash.No petechiae.  CNS;  No focal neurological deficit.        Intake/Output last 3 shifts:  I/O last 3 completed shifts:  In: 1738 [I.V.:800]  Out: 0     Recent Labs;                           Lab name 09/02/15  0359 09/05/15  0643 09/09/15  0413   WBC 8.7 8.7 6.2   HEMOGLOBIN 7.6* 7.9* 7.1*   HEMATOCRIT 23.8* 25.0* 23.0*   MEAN CORPUSCULAR VOLUME 89.9 89.1 90.0   PLATELETS 575* 534* 521*  Lab name 09/02/15  0359 09/05/15  0643 09/09/15  0413   SODIUM 137 136 138   POTASSIUM 4.5 4.7 4.2   CREATININE 0.54* 0.59* 0.58*   BUN 30* 33* 32*   CHLORIDE 104 102 105   CO2 28 28 26    PHOSPHORUS 4.1 3.9 3.9   MAGNESIUM 2.0 2.1 2.0                         Lab name 08/19/15  0520 08/26/15  0521 09/02/15  0359 09/09/15  0413   AST 23 23 14 14    ALT 26 17 17 16    BILIRUBIN TOTAL 0.3 0.3 0.2 0.2       Lab name 07/05/15  0702  07/09/15  0544  09/02/15  0359 09/05/15  0643 09/09/15  0413   PREALBUMIN 14.0*  --  16.0*  --   --   --   --    ALBUMIN  --   < > 1.5*  < > <1.5*   <1.5* <1.5* <1.5*   <1.5*   < > = values in this interval not displayed.                                                                                                   Lab name 09/05/15  0643 09/09/15  0413   CALCIUM 8.0* 8.0*   PHOSPHORUS 3.9 3.9                          Lab name 09/02/15  0359 09/09/15  0413   TRIGLYCERIDES 89 72                        Lab name 08/05/15  0900 08/07/15  0850 08/10/15  0830   VANCOMYCIN TROUGH 9.3* 9.1* 17.4       CT abdomen 3/24  1. Multiple abscess, large  pelvic abscess with surgical drain,  right issue rectal fossa.  2. 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  3. Granulation tissue enhancement or blood extravasation in the  lower abdominal wall as described  4. Proximity of the small bowel vasculature at the level of the  open wound on the right, side of active bleeding  5. Lower lobe bronchopneumonia and aspiration is also considered    Diet;    Diet Orders          TPN ADULT ([facility]) at 75 mL/hr starting at 05/29 1800    TPN ADULT ([facility]) at 75 mL/hr starting at 05/28 1800    Diet NPO effective now Except for: except ice chips, except sips of clears starting at 03/28 0959            Assessment/Plan:      1. Peritoneal abscess- Mixed flora, pigtail drain 06/26/15 . Vanco/Zosyn/Diflucan.           -Completed  6 week Abx on 08/12/15.      2. Enterocutaneous Fistula- wound care, bowel rest. Still  has significant output.    3. Severe intractable acute on chronic pain syndrome- receiving Dilaudid IV every 3 hr  Prn,Fentanyl patch decreased to 25  from 50  mcg. Started PO Oxycodone scheduled as she had better response with it previously. Added Gabapentin as she was on previously.         Will gradually decrease the IV dilaudid dose. Decreased to 2 mg IV Q 4 hr  On 5/26 . Refer to pain management. Clinic.         For depression, Remeron was added on 5/24. Will monitor her behavior. Pt is non compliant with most of the treatment offered to her.     4. Thrombocytosis. uncertain etiology- follow closely. Likely reactive.     5. Cachexia-   continue TPN, recheck PAB. Albumin stays less than 1.5.    6. Sinus tachycardia. Likely related to pain. Monitor.Contine  Metoprolol with holding parameter.     7. Ischemic feet/arterial thromboemboli- The patient had a clot in her femoral artery right leg that was cleared by vascular surgery.  However the patient ended up with necrosis at her distal digits which may be a  combination of lack of blood flow and the use of pressors. Refusing heparin, aspirin and Plavix. Refused all heparin injections previously as well. Canceled aspirin but continue to encourage Plavix..           Following podiatrist at [facility]. Getting better.     8. History of colon cancer s/p exenteration with bilateral nephrostomy tubes    9. Recurrent presacral abscesses. Completed the course of Abx.    10. Debility- PT/OT. Pt is not participating most of the times.   11. Dysphagia. Feel choking sensation on medication. SLP consulted.     12. On TPN. 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 Cant tolerate PO. Weekly LFT while on TPN. TPN being managed by pharmacy. Electrolytes replacement through TPN. LFT are WNL so far.Mg being replaced through TPN.    13. D/C planning . Family meeting held on  5/24. [name]  Pt need extensive wound care, TPN, IV Pain meds which are hurdle to discharge.        Goal is set to d/c her with home care in first week of June.     14. Nausea.  Zofran.     15.  New JP drain was placed on 5/16. F/u IR for drain management as OP. Appointment with Dr. Rushie Chestnut on 6/6.         All interval notes reviewed.Will continue current treatment plan .    Recent labs reviewed.  Hb is slightly trending down. Will monitor. No overt bleeding noted. Likely secondary to nutritional deficiency.  Continue TPN.  Continue Wound care.  Pt is not taking her Plavix, Mirtazapine, Gabapentin, Metoprolol. Refusing to take Lovenox for DVT prevention.       Prophylaxis;  GI Prophylaxis: Pepcid  DVT Prophylaxis: Lovenox     Code Status: Full Code      Alejo Beamer MD.       09/09/2015

## 2015-09-09 NOTE — Unmapped (Signed)
Clinical Pharmacy Service - TPN Progress Note    Linda Ponce is followed by the pharmacy department for monitoring and adjustment of TPN    Assessment/Plan:   ?? Patient weight has not improved in 30 days, (90 lbs to 88 lbs) with low albumin, continue macronutrients as ordered, protein recently increased to meet increased demands  ?? Continue TPN with electrolytes, macronutrients and additives from yesterday, check renal panel twice weekly    Subjective/Objective:    Recent Labs      09/09/15   0413   NA  138   CL  105   CO2  26   BUN  32*   CREATININE  0.58*   K  4.2   PHOS  3.9   MG  2.0   CALCIUM  8.0*   FCAS  4.73   ALBUMIN  <1.5*   <1.5*   GLUCOSE  116*   ALKPHOS  206*   AST  14   ALT  16   BILITOT  0.2   BILIDIRECT  0.12   TRIG  72     Recent Labs      09/07/15   0803  09/08/15   0937   POCGMD  109*  115*      Insulin Orders       Dose Frequency Start End    insulin regular (HumuLIN R) injection Soln 0-5 Units 0-5 Units Daily 07/26/2015     Route: Subcutaneous           Diet Orders          TPN ADULT ([facility]) at 75 mL/hr starting at 05/28 1800    Diet NPO effective now Except for: except ice chips, except sips of clears starting at 03/28 0959        TPN Medication Recent History (Show up to 1 orders; newest on the left.)     Start date and time   09/08/2015 1800      TPN ADULT ([facility]) [[id number]]    Order Status  Active    Last Given  09/08/2015 1723       Macro Ingredients    amino acid 10%  47.8 g/L    dextrose 70%  162.8 g/L       QS Base    sterile water  324.33 mL       Lipids    fat emulsion 30 %  19.5 g/L       TPN Electrolytes entered as PER DAY amount    sodium chloride  90 mEq    magnesium sulfate  20 mEq    calcium gluconate  9.6 mEq    potassium chloride  18 mEq    potassium phosphate  18 mmol       TPN Additives entered as PER DAY amount    MVI adult no. 1 with vitamin K  10 mL    ascorbic acid (vitamin C)  300 mg    trace elements (MULTITRACE-5)  1 mL    zinc chloride  5 mg       Calorie  Contribution    Proteins  344 kcal    Dextrose  997.22 kcal    Lipids  351 kcal    Total  1,692.22 kcal       Electrolyte Ion Calculated Amount    Sodium  90 mEq    Potassium  44.4 mEq    Calcium  9.6 mEq    Magnesium  20 mEq    Aluminum  --  Phosphate  18 mmol    Chloride  108 mEq    Acetate  --       Other    Total Protein  86 g    Total Protein/kg  2.15 g/kg    Glucose Infusion Rate  5.09 mg/kg/min    Osmolarity  1,487.79    Volume  1,800 mL    Rate  75 mL/hr    Dosing Weight  40 kg    Infusion Site  [facility]           Thank you for the consult,  Andree Elk, PHARMD  09/09/2015 9:42 AM

## 2015-09-09 NOTE — Unmapped (Addendum)
Patient was agreeable for dressing to be changed; assessment of drains and providing care after receiving Dilaudid 2 mg @ 0417 (as discussed); when this writer entered room to provide care; patient declined stated, she is in too much pain; she hurts all over. Patient is aware that skin is starting to become red and excoriated with increased tenderness due to refusing skin care, dressings to be changed due to drainage from wounds, not repositioning and refusing linen changes. Leary Roca (CNA) was a witness to patient's refusal; Programmer, systems) was notified of the situation; on-coming shift Civil Service fast streamer) and Paramedic [name]) also aware of situation; continues to refuse oral medication except for liquid Oxycodone; call light in reach; patient sleeping during hourly checks for the remainder of the shift.

## 2015-09-09 NOTE — Unmapped (Signed)
Problem: Potential for Compromised Skin Integrity  Goal: Skin integrity is maintained or improved  Assess and monitor skin integrity. Identify patients at risk for skin breakdown on admission and per policy. Collaborate with interdisciplinary team and initiate plans and interventions as needed.   Intervention: Relieve pressure to bony prominences  Provide measures to decrease pressure to skin such as specialty mattresses, egg crate mattresses and beds, elbow and heel protectors.   Patient helped turn in bed to prevent bed sores and to promote comfort in bed

## 2015-09-09 NOTE — Unmapped (Signed)
Patient continues refusing to be repositioned; will approach patient again later in the shift; medication given as ordered.

## 2015-09-10 LAB — POC GLU MONITORING DEVICE: POC Glucose Monitoring Device: 109 mg/dL (ref 70–100)

## 2015-09-10 MED ORDER — oxyCODONE (ROXICODONE INTENSOL) 20 mg/mL concentrated solution 20 mg
20 | ORAL | Status: AC
Start: 2015-09-10 — End: 2015-09-13
  Administered 2015-09-10 – 2015-09-13 (×10): 20 mg via ORAL

## 2015-09-10 MED ORDER — TPN ADULT (WCH/Drake)
3300 | INTRAVENOUS | Status: AC
Start: 2015-09-10 — End: 2015-09-11
  Administered 2015-09-10: 22:00:00 via INTRAVENOUS

## 2015-09-10 MED FILL — FAMOTIDINE (PF) 20 MG/2 ML INTRAVENOUS SOLUTION: 20 20 mg/2 mL | INTRAVENOUS | Qty: 2

## 2015-09-10 MED FILL — FENTANYL 25 MCG/HR TRANSDERMAL PATCH: 25 25 mcg/hr | TRANSDERMAL | Qty: 1

## 2015-09-10 MED FILL — ONDANSETRON HCL (PF) 4 MG/2 ML INJECTION SOLUTION: 4 4 mg/2 mL | INTRAMUSCULAR | Qty: 2

## 2015-09-10 MED FILL — OXYCODONE 20 MG/ML ORAL CONCENTRATE: 20 20 mg/mL | ORAL | Qty: 1

## 2015-09-10 MED FILL — HYDROMORPHONE 2 MG/ML INJECTION SYRINGE: 2 2 mg/mL | INTRAMUSCULAR | Qty: 1

## 2015-09-10 MED FILL — GABAPENTIN 250 MG/5 ML ORAL SOLUTION: 50 50 mg/mL | ORAL | Qty: 5

## 2015-09-10 MED FILL — TRAVASOL 10 % INTRAVENOUS SOLUTION: 10 10 % | INTRAVENOUS | Qty: 860

## 2015-09-10 NOTE — Unmapped (Signed)
Problem: Potential for Compromised Skin Integrity  Goal: Skin integrity is maintained or improved  Assess and monitor skin integrity. Identify patients at risk for skin breakdown on admission and per policy. Collaborate with interdisciplinary team and initiate plans and interventions as needed.   Intervention: Relieve pressure to bony prominences  Provide measures to decrease pressure to skin such as specialty mattresses, egg crate mattresses and beds, elbow and heel protectors.   Patient urged to let help change position in bed to relieve pressure to bony prominences and prevent bed sores

## 2015-09-10 NOTE — Unmapped (Signed)
RN Clinical Progress Note      Patient comes to [facility] for Post-Acute Care for the following reasons: jewish 07/05/15 dx: sepsis    Patient Active Problem List    Diagnosis Date Noted   ??? Adjustment disorder with mixed anxiety and depressed mood 08/06/2015   ??? Pain of multiple sites 08/06/2015   ??? Cognitive dysfunction-recent onset. 08/06/2015   ??? 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 Malnutrition 07/08/2015   ??? Fistula 07/08/2015   ??? Abdominal abscess 07/04/2015       Filed Vitals:    09/09/15 0449 09/09/15 1748 09/09/15 2316 09/10/15 0635   BP: 106/64 103/66 99/59 107/65   Pulse: 126 115 128 124   Temp: 98.8 ??F (37.1 ??C) 98.5 ??F (36.9 ??C) 98.8 ??F (37.1 ??C) 99.1 ??F (37.3 ??C)   TempSrc: Oral  Oral Oral   Resp: 18 18 20 20    Height:       Weight:       SpO2: 100% 98% 100% 100%       Lines/Drains/Access - Yes   If yes, please list  R T IJ    Does the patient have any changes to their condition warranting intervention? No       Does the patient's have any transfers/appointments or testing scheduled -   Future Appointments  Date Time Provider Department Center   09/24/2015 1:00 PM Harsh Devin Going, MD [facility] PAIN [facility]       Patient/Family questions - No    RN Shift Note Additional:  Patient Linda Ponce is a 45 y.o. female that is alert and oriented x4. Patient is able to make needs known. Assessment completed and documented. Patient tolerated meds well. Wound care completed to ABD Q2 hours, tolerated well. Pt c/o of pain treated with PRN Dilaudid and scheduled Oxy, see MAR. Pt is afebrile. Patient respiratory status constant. Patient pain level denies pain at this time. Patient stable. Pt currently resting in bed. Call light within reach.     [name], RN  09/10/2015 7:29 PM

## 2015-09-10 NOTE — Unmapped (Signed)
Problem: Pain  Goal: LTG - Patient will manage pain with the appropriate technique/Intervention  Outcome: Progressing  Patient c/o of pain. Treated with PRN Dilaudid, see MAR. Patient states some relief.

## 2015-09-10 NOTE — Unmapped (Signed)
Pharmacy Nutrition Support Service    Linda Ponce is a 45 y.o. female on TPN.  Pharmacy was consulted for monitoring and adjustment.    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    Current TPN Formula (started 5/26)   Based on current weight of 40 kg   Amino Acids: 47.8 g/L (86 grams, 2.15 grams/kg/day, 344 kcal)   Dextrose: 162.8 g/L (293 grams, 997 kcal)   Lipids: 19.5 g/L (35.1 grams, 351 kcal)   Total Daily Calories: 1692 kcal (42.3 kcal/kg/day)   Volume: 1800 mL   Rate: 75 mL/h    Previous TPN Formula (4/21-5/25)   Based on weight 43.12 kg   Amino Acids: 47.7 g/L (80 grams, 1.86 grams/kg/day, 320 kcal)   Dextrose: 175 g/L (293 grams, 997 kcal)   Lipids: 21 g/L (35.2 grams, 352 kcal)   Total Daily Calories: 1669 kcal (38.7 kcal/kg/day)   Volume: 1675 mL   Rate: 69.8 mL/h    Pt Weights:  40 kg (09/08/15)    43.12 kg (09/02/15)    40.8 kg (08/17/15)    40.9 kg (08/11/15)    40.8 kg (08/03/15)    38.7 kg (07/31/15)    39.8 kg (07/27/15)    42.8 kg (07/21/15)    TPN Medication Recent History (Show up to 1 orders; newest on the left.)     Start date and time   09/09/2015 1800      TPN ADULT ([facility]) [[id number]]    Order Status  Active    Last Given  09/09/2015 1759       Macro Ingredients    amino acid 10%  47.8 g/L    dextrose 70%  162.8 g/L       QS Base    sterile water  324.33 mL       Lipids    fat emulsion 30 %  19.5 g/L       TPN Electrolytes entered as PER DAY amount    sodium chloride  90 mEq    magnesium sulfate  20 mEq    calcium gluconate  9.6 mEq    potassium chloride  18 mEq    potassium phosphate  18 mmol       TPN Additives entered as PER DAY amount    MVI adult no. 1 with vitamin K  10 mL    ascorbic acid (vitamin C)  300 mg    trace elements (MULTITRACE-5)  1 mL    zinc chloride  5 mg       Calorie Contribution    Proteins  344 kcal    Dextrose  997.22 kcal    Lipids  351 kcal    Total  1,692.22 kcal       Electrolyte Ion Calculated Amount    Sodium  90 mEq    Potassium   44.4 mEq    Calcium  9.6 mEq    Magnesium  20 mEq    Aluminum  --    Phosphate  18 mmol    Chloride  108 mEq    Acetate  --       Other    Total Protein  86 g    Total Protein/kg  2.15 g/kg    Glucose Infusion Rate  5.09 mg/kg/min    Osmolarity  1,487.79    Volume  1,800 mL    Rate  75 mL/hr    Dosing Weight  40 kg    Infusion Site  Central        Diet Orders          TPN ADULT ([facility]) at 75 mL/hr starting at 05/29 1800    Diet NPO effective now Except for: except ice chips, except sips of clears starting at 03/28 0959          TPN Associated Medications:    - GI Prophylaxis: famotidine   - Antiemetic: ondansetron PRN   - Bowel Regimen: none   - Insulin Regimen: sliding scale regular insulin     Recent Labs:    Lab 09/09/15  0413 09/05/15  0643   SODIUM 138 136   POTASSIUM 4.2 4.7   CHLORIDE 105 102   CO2 26 28   BUN 32* 33*   CREATININE 0.58* 0.59*   GLUCOSE 116* 88   CALCIUM 8.0* 8.0*   PHOSPHORUS 3.9 3.9   MAGNESIUM 2.0 2.1   Free calcium (09/09/15): 4.73, goal 4.40 - 5.40 mg/dL      Lab 16/10/96  0454 09/05/15  0643   ALK PHOS 206*  --    ALT 16  --    AST 14  --    BILIRUBIN TOTAL 0.2  --    BILIRUBIN DIRECT 0.12  --    TOTAL PROTEIN 5.6*  --    ALBUMIN <1.5*   <1.5* <1.5*         Lab 09/09/15  0413   TRIGLYCERIDES 72         Assessment and Plan:    - Macronutrients: Continue current forumla as per dietary's recommendations; amino acids and total volume were increased on 5/26.  - Electrolytes: No changes today. Noted that BUN remains elevated despite increased fluid in TPN; will discuss with dietary.  - Multivitamin/Trace Elements: provided daily in TPN.   - Glucose: has been WNL; no sliding scale insulin doses required since 4/14.  - Other additives: ascorbic acid 500 mg daily (200 mg supplied by multivitamin, plus 300 mg additional) + zinc 10 mg daily (5 mg provided by trace elements, plus 5 mg additional) considering GI output and wound healing.  - Check labs twice weekly: Monday/Thursday.    Thank you  for the consult.    Kristie Cowman, PharmD  09/10/2015 7:38 AM

## 2015-09-10 NOTE — Unmapped (Signed)
Physical Therapy   Reason Patient Not Seen         Name: Linda Ponce  DOB: Sep 17, 1970  Attending Physician: Valda Lamb, MD  Admission Diagnosis: jewish 07/05/15 dx: sepsis  Date: 09/10/2015    Reviewed Pertinent hospital course: Yes    Unable to see patient due to :Pain Attempted to tx pt at 13:15. Pt declined therapy this date in the presence of PCA (Ciara) and nurse Annie Main) stating, I just can't do it today. When asked why, pt states she is having pain all over, leaking from abdominal bags (despite RN stating that they were dry at this point and have cloths around them) and feeling nauseous (despite RN stating pt had already received Zofran prior to now). Pt offered stretching to B LEs and sitting on EOB. Pt declined both at this time. Pt stated she wanted to try sitting up maybe later. Explained to pt that we may not be available later today to assist as we have a full schedule. Plan to cont skilled PT on next visit.     Rulon Abide, PT, DPT [phone]

## 2015-09-10 NOTE — Unmapped (Addendum)
[facility] Inpatient Medical Psychology Service  [place] Department of Neurology & Rehabilitation Medicine  Division of Neuropsychology & Medical Psychology     INPATIENT MEDICAL PSYCHOLOGY PROGRESS NOTE    Patient Name:  Linda Ponce          DOB:  09-16-1970  Admit Date:  07/08/2015   CSN:  [id number]    Referring Provider:  Valda Lamb, MD  Location:  [name] Room N319/DN319-02     Current Date: 09/10/2015  Time and Duration of Service: 8:35p-9:15p = 40 minutes face to face time    CPT Code:  47829 - Health & Behavior Intervention - Individual - 3 units, 40 min face to face time.    Current Problem List:      Patient Active Problem List   Diagnosis   ??? Abdominal abscess   ??? Enterocutaneous fistula   ??? Malnutrition   ??? Fistula   ??? Adjustment disorder with mixed anxiety and depressed mood   ??? Pain of multiple sites   ??? Cognitive dysfunction-recent onset.       Current Medications:     Scheduled Medications:    ??? clopidogrel  75 mg Per NG tube Daily 0900   ??? enoxaparin  40 mg Subcutaneous Daily 0900   ??? famotidine (PF)  20 mg Intravenous BID   ??? fentaNYL  1 patch Transdermal Q72H   ??? gabapentin  150 mg Oral 3 times per day   ??? insulin regular  0-5 Units Subcutaneous Daily 0900   ??? metoprolol tartrate  50 mg Oral BID   ??? mirtazapine  15 mg Oral Nightly (2100)   ??? oxyCODONE  20 mg Oral Q4H2 SCH     PRN Medications:  ALPRAZolam, alteplase, dextrose, dextrose 50 % in water (D50W), diclofenac sodium, heparin lock flush, HYDROmorphone, ipratropium-albuterol, LORazepam, ondansetron      Objective Presentation/Subjective Concerns and Related Interventions:      ?? Pt declined session at 5:25p due to wanting to sleep. Returned later as requested.  ?? Pt presents as asleep but woke easily and agreed to session. Pain behaviors increased as I sat down. Mood was depressed.  ?? Pt states that she has only had 1 dose of 20 mg oxycodone, but she had one prior to most recent.  Seemed more comfortable, but not without pain.  ?? Informed her that I was aware of her not taking meds, but that it was her right and I wasn't going to discuss it further unless she wanted to.   ?? When I asked her about her swallowing study, she got very agitated and upset, stating that she had not had one, and did not remember having one. I attempted to minimize the issue as it went well, but she continued to talk about not having it, getting progressively upset. She did state that she wants the SLP to return to see if she recognizes her, and stated that she didn't want to talk about it any more (she was doing most of the talking).   ?? After she calmed down, I suggested that we focus future sessions on learning how to not get as upset when things don't match her perceptions or wishes. She agreed and asked me to return tomorrow.      Psychiatric Diagnoses:      ?? Adjustment disorder with mixed anxiety and depressed mood.  ?? Related to prolonged hospitalization and inadequately controlled pain in abdomen, back, and legs.  ??  She has had NO prior treatment for anxiety or depression or substance use disorders.   ?? Started taking Remeron Soltab mg qhs on 09/05/15.    ?? Pain of Multiple Sites.    ?? 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 Pt reports severe pain ranging from 8-10/10 even with fentanyl 125 mcg patch. Dilaudid IV q3h prn reduces it to 5/10 but does not last.  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 No history of narcotic abuse, but she has been on narcotics for abdominal and radiation pain from 2011 cancer surgery and complications x 6 yrs. Best regimen per pt post op at [facility] was oxycodone 15 mg q4h scheduled and dilaudid 1 mg q3h prn which she tapered  down before discharge.She also took neurontin 300 mg TID when home prior to surgery for neuropathic pain (low back).  Psychosocial losses (death of father 1 week before surgery, missing teen son who is in [place] w/her mother) reduce ability to cope with pain and hospitalization.     ?? Cognitive dysfunction--recent onset.    ?? Likely residual of significant delirium at [facility], pain medications, distraction from pain itself, and possibly IV ABX. Will monotor. Primarily involves orientation to time, and somewhat to place, short term recall, and working memory. Expected to improve as pt's wounds and infections improve.  ?? MMSE-2 score of 21/20 (T=21) indicates moderate cognitive impairment involving orientation, short term recall, and working memory.   ?? Clinical assessment of common sense reasoning skills indicated normal limits reasoning skills except when her pain gets severe, at which point she becomes fearful and suspicious about medications being withheld because she bother's the nurses too much.      RECOMMENDATIONS -- DR Cloretta Ned:    ?? As she won't take medication for anxiety, please consider scheduling Zofran. She uses it 2-3x/day, but often asks later then necessary, resulting in more nausea and non-compliance with other medication.  ?? Given patient's not remembering seeing SLP this morning, and reported increased confusion at night, please consider screening her for UTI or other infection.      SLP:   ?? Please visit patient so she can see if she remembers seeing you. I suspect she had just had dilaudid and didn't register the interaction well.       Med Psychology Plan:    ?? I will see her on tomorrow to again attempt to teach her cognitive restructuring strategies.      Thank you for allowing me to participate in the care of your patient.    Linda Ponce P. Minerva Ends, PhD  Licensed Insurance risk surveyor in Medical Psychology  [place] Department of Neurology & Rehabilitation  Medicine  Division of Neuropsychology & Medical Psychology  Office Phone for non-urgent messages/requests:  [phone]  Pager--urgent issues only: [phone]    09/10/2015

## 2015-09-10 NOTE — Unmapped (Signed)
Patient took oxycodone as per the schedule and dilaudid every four hours but refused all the other medication. In the morning pt reported feeling nauseated, nausea medication given and some relief reported by the patient

## 2015-09-10 NOTE — Unmapped (Signed)
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 Presented to [facility] with abdominal distention and fevers.?? Identified as having pelvic and intraabdominal abscess - drained by IR?? - mixed enteric flora with no predominance.?? Readmitted to [facility] with bleeding for wound.?? Course complicated by ischemia to right foot..??     Subjective & Interval History;    Pt examined at the bedside.  Looking comfortable .  No new event  was noted.  Fistula pouch was leaking again today.  Pt is non compliant with most of her prescribed medication. Not cooperative with nursing and PT/OT.      Review of Systems:  10 point ROS was negative except as above.   Denies any fever,chill, headache,N/V, CP ,SOB,Cough, Palpitation. Denies any joint pain, skin rash,anxiety or depression.      PMH: Unchanged from H&P.   PSH: Unchanged from H&P.   Family medical history: Unchanged from H&P.   Social history: Unchanged from H&P      Scheduled Meds:  ??? clopidogrel  75 mg Per NG tube Daily 0900   ??? enoxaparin  40 mg Subcutaneous Daily 0900   ??? famotidine (PF)  20 mg Intravenous BID   ??? fentaNYL  1 patch Transdermal Q72H   ??? gabapentin  150 mg Oral 3 times per day   ??? insulin regular  0-5 Units Subcutaneous Daily 0900   ??? metoprolol tartrate  50 mg Oral BID   ??? mirtazapine  15 mg Oral Nightly (2100)   ??? oxyCODONE  15 mg Oral Q4H2 SCH     Continuous Infusions:  ??? TPN ADULT ([facility]) 75 mL/hr at 09/09/15 1759   ??? TPN ADULT ([facility])       PRN Meds:ALPRAZolam, alteplase, dextrose, dextrose 50 % in water (D50W), diclofenac sodium, heparin lock flush, HYDROmorphone, ipratropium-albuterol, LORazepam, ondansetron    Objective:    Vital signs in last 24 hours:  Temp:  [98.5 ??F (36.9 ??C)-99.1 ??F (37.3 ??C)] 99.1 ??F (37.3 ??C)  Heart Rate:  [115-128] 124  Resp:  [18-20] 20  BP: (99-107)/(59-66) 107/65 mmHg  FiO2:  [21 %]  21 %    Physical Examination:    GENERAL: The patient is somnolent ,not in acute cardiorespiratory distress. Looking chronically sick.   HEENT:Atraumatic.Normocephalic. PERRLA, EOMI. No icterus, no pallor, no jugular venous distention..No ear or nose discharge.  NECK: Supple. Trachea is in midline. No Thyromegaly  CHEST: Symmetric. Clear to auscultation, bilateral equal air entry. No wheezing or rhonchi.  CARDIOVASCULAR: S1, S2, Regular pulse. No murmur or gallops. No JVD.  ABDOMEN: Whole anterior abdomen is covered with pouch  EXTREMITIES: There is + BLE edema . Right foot has dressing in place.   Skin;No bruise,No rash.No petechiae.  CNS;  No focal neurological deficit.        Intake/Output last 3 shifts:  I/O last 3 completed shifts:  In: 1646   Out: -     Recent Labs;                           Lab name 09/02/15  0359 09/05/15  0643 09/09/15  0413   WBC 8.7 8.7 6.2   HEMOGLOBIN 7.6* 7.9* 7.1*   HEMATOCRIT 23.8* 25.0* 23.0*   MEAN CORPUSCULAR VOLUME 89.9 89.1 90.0   PLATELETS 575*  534* 521*                                                            Lab name 09/02/15  0359 09/05/15  0643 09/09/15  0413   SODIUM 137 136 138   POTASSIUM 4.5 4.7 4.2   CREATININE 0.54* 0.59* 0.58*   BUN 30* 33* 32*   CHLORIDE 104 102 105   CO2 28 28 26    PHOSPHORUS 4.1 3.9 3.9   MAGNESIUM 2.0 2.1 2.0                         Lab name 08/19/15  0520 08/26/15  0521 09/02/15  0359 09/09/15  0413   AST 23 23 14 14    ALT 26 17 17 16    BILIRUBIN TOTAL 0.3 0.3 0.2 0.2       Lab name 07/05/15  0702  07/09/15  0544  09/02/15  0359 09/05/15  0643 09/09/15  0413   PREALBUMIN 14.0*  --  16.0*  --   --   --   --    ALBUMIN  --   < > 1.5*  < > <1.5*   <1.5* <1.5* <1.5*   <1.5*   < > = values in this interval not displayed.                                                                                                   Lab name 09/05/15  0643 09/09/15  0413   CALCIUM 8.0* 8.0*   PHOSPHORUS 3.9 3.9                          Lab name 09/02/15  0359  09/09/15  0413   TRIGLYCERIDES 89 72                        Lab name 08/05/15  0900 08/07/15  0850 08/10/15  0830   VANCOMYCIN TROUGH 9.3* 9.1* 17.4       CT abdomen 3/24  1. Multiple abscess, large pelvic abscess with surgical drain,  right issue rectal fossa.  2. 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  3. Granulation tissue enhancement or blood extravasation in the  lower abdominal wall as described  4. Proximity of the small bowel vasculature at the level of the  open wound on the right, side of active bleeding  5. Lower lobe bronchopneumonia and aspiration is also considered    Diet;    Diet Orders          TPN ADULT ([facility]) at 75 mL/hr starting at 05/30 1800    TPN ADULT ([facility]) at 75 mL/hr starting at 05/29 1800    Diet NPO effective now Except for: except ice chips, except sips of clears  starting at 03/28 0959            Assessment/Plan:      1. Peritoneal abscess- Mixed flora, pigtail drain 06/26/15 . Vanco/Zosyn/Diflucan.           -Completed  6 week Abx on 08/12/15.      2. Enterocutaneous Fistula- wound care, bowel rest. Still has significant output.    3. Severe intractable acute on chronic pain syndrome- receiving Dilaudid IV every 3 hr  Prn,Fentanyl patch decreased to 25  from 50  mcg. Started PO Oxycodone scheduled as she had better response with it previously. Added Gabapentin as she was on previously.         Will gradually decrease the IV dilaudid dose. Decreased to 2 mg IV Q 4 hr  On 5/26 . Referred  to pain management.  Clinic. Increased       Oxycodone to 20 mg po Q4hr on 5/30.  . Will try to decrease IV Dilaudid to 1 mg IV Q4 hr tomorrow.         For depression, Remeron was added on 5/24. Will monitor her behavior. Pt is non compliant with most of the treatment offered to her.     4. Thrombocytosis. uncertain etiology- follow closely. Likely reactive.     5. Cachexia-   continue TPN, recheck PAB. Albumin stays less than  1.5.    6. Sinus tachycardia. Likely related to pain. Monitor.Contine  Metoprolol with holding parameter. Pt not taking it.     7. Ischemic feet/arterial thromboemboli- The patient had a clot in her femoral artery right leg that was cleared by vascular surgery.  However the patient ended up with necrosis at her distal digits which may be a combination of lack of blood flow and the use of pressors. Refusing heparin, aspirin and Plavix. Refused all heparin injections previously as well. Canceled aspirin but continue to encourage Plavix..           Following podiatrist at [facility]. Getting better.     8. History of colon cancer s/p exenteration with bilateral nephrostomy tubes    9. Recurrent presacral abscesses. Completed the course of Abx.    10. Debility- PT/OT. Pt is not participating most of the times.   11. Dysphagia. Feel choking sensation on medication. SLP following. Pt with min oral dysphagia 2/2 anterior spillage with saliva, but none with PO. Pt was able to use a straw without difficulty. ??    On TPN. 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 Can not tolerate PO. Weekly LFT while on TPN. TPN being managed by pharmacy. Electrolytes replacement through TPN. LFT are WNL so far. Fistula are not healing well. Not a candidate for enteric feeding or PEG placement because everything is coming out.   Will need TPN for at least 3-6 month depending the fistula healing.     12. D/C planning . Family meeting held on  5/24. [name]  Pt need extensive wound care, TPN, IV Pain meds which are hurdle to discharge.        Goal is set to d/c her with home care in first week of June.     13. Nausea.  Zofran.     14.  New JP drain was placed on 5/16. F/u IR for drain management as OP. Appointment with Dr. Rushie Chestnut on 6/6.     A/P reviewed and updated.  Increased PO Oxycodone to 20 mg q 4 hr. Will try to decrease Dilaudid IV tomorrow.   Pt is not taking her  Plavix, Mirtazapine, Gabapentin, Metoprolol. Refusing to take Lovenox for DVT prevention. MAR reviewed.    Pt states that these are too many medication to take and she is not eating. It hurts her stomach.     Prophylaxis;  GI Prophylaxis: Pepcid  DVT Prophylaxis: Lovenox     Code Status: Full Code      Cullen Vanallen MD.       09/10/2015

## 2015-09-10 NOTE — Unmapped (Signed)
See other note.

## 2015-09-10 NOTE — Unmapped (Deleted)
Speech Language Pathology  Clinical Swallow Assessment    Name: Linda Ponce  DOB: [birth year]  Attending Physician: Valda Lamb, MD  Admission Diagnosis: jewish 07/05/15 dx: sepsis  Date: 09/10/2015  Precautions: standard  Reviewed Pertinent hospital course: Yes       Summary:    Pt presents without pharyngeal dysphagia. Pt with min oral dysphagia 2/2 anterior spillage with saliva, but none with PO. Pt was able to use a straw without difficulty.   Pt was given handout and verbal education and strategies to assist swallow pills    Recommendation  Use strategies to swallow medication when appropriate   1. Effortful swallow with tongue blade on roof of mouth during swallow   2. masako  3. Chin tuck  ST will follow up x1 next week ( week of June 5)      Pain:  Pain Score: 10-Worst pain ever      Goals  Pt will utilized swallow strategies when having difficulty with pills  Goal to be met: 09/21/15      Oral/Motor   anterior loss saliva; pt reported this is not new. Pt reported nausea and wretched a couple times during evaluation; anterior loss saliva may be due to current nausea.  Noted WFL tongue,lip, cheek ROM    Consistencies Assessed   Pt is NPO except ice chips and clear liquids    Thin   No overt s/s aspiration/penetration    Hyolaryngeal elevation appears WFL      Time  Start Time: 0800  Stop Time: 0820  Time Calculation (min): 20 min    Charges   $Clinical Swallow: 1 Procedure       Azalia Bilis [facility] MA [facility] SLP [phone]  Speech-Language Pathologist  Pager# [phone]

## 2015-09-11 LAB — URINALYSIS W/RFL TO MICROSCOPIC
Bilirubin, UA: NEGATIVE
Blood, UA: NEGATIVE
Glucose, UA: NEGATIVE mg/dL
Ketones, UA: NEGATIVE mg/dL
Nitrite, UA: NEGATIVE
Protein, UA: 30 mg/dL
RBC, UA: 6 /HPF (ref 0–3)
Specific Gravity, UA: 1.016 (ref 1.005–1.035)
Squam Epithel, UA: <1 /HPF (ref 0–5)
Urobilinogen, UA: <2 mg/dL (ref 0.2–1.9)
WBC, UA: 6 /HPF (ref 0–5)
pH, UA: 9 (ref 5.0–8.0)

## 2015-09-11 LAB — POC GLU MONITORING DEVICE: POC Glucose Monitoring Device: 100 mg/dL (ref 70–100)

## 2015-09-11 MED ORDER — ondansetron (ZOFRAN) 4 mg/2 mL injection 4 mg
4 | Freq: Four times a day (QID) | INTRAMUSCULAR | Status: AC
Start: 2015-09-11 — End: 2015-09-13
  Administered 2015-09-13: 09:00:00 4 mg via INTRAVENOUS

## 2015-09-11 MED ORDER — TPN ADULT (WCH/Drake)
1 | INTRAVENOUS | Status: AC
Start: 2015-09-11 — End: 2015-09-12
  Administered 2015-09-11: 22:00:00 via INTRAVENOUS

## 2015-09-11 MED ORDER — HYDROmorphone (DILAUDID) injection Syrg 1 mg
1 | INTRAMUSCULAR | Status: AC | PRN
Start: 2015-09-11 — End: 2015-09-13
  Administered 2015-09-11 – 2015-09-13 (×12): 1 mg via INTRAVENOUS

## 2015-09-11 MED FILL — GABAPENTIN 250 MG/5 ML ORAL SOLUTION: 50 50 mg/mL | ORAL | Qty: 5

## 2015-09-11 MED FILL — OXYCODONE 20 MG/ML ORAL CONCENTRATE: 20 20 mg/mL | ORAL | Qty: 1

## 2015-09-11 MED FILL — HYDROMORPHONE 1 MG/ML INJECTION SYRINGE: 1 1 mg/mL | INTRAMUSCULAR | Qty: 1

## 2015-09-11 MED FILL — METOPROLOL TARTRATE 50 MG TABLET: 50 50 MG | ORAL | Qty: 1

## 2015-09-11 MED FILL — ENOXAPARIN 40 MG/0.4 ML SUBCUTANEOUS SYRINGE: 40 40 mg/0.4 mL | SUBCUTANEOUS | Qty: 0.4

## 2015-09-11 MED FILL — CLOPIDOGREL 75 MG TABLET: 75 75 mg | ORAL | Qty: 1

## 2015-09-11 MED FILL — ONDANSETRON HCL (PF) 4 MG/2 ML INJECTION SOLUTION: 4 4 mg/2 mL | INTRAMUSCULAR | Qty: 2

## 2015-09-11 MED FILL — FAMOTIDINE (PF) 20 MG/2 ML INTRAVENOUS SOLUTION: 20 20 mg/2 mL | INTRAVENOUS | Qty: 2

## 2015-09-11 MED FILL — HYDROMORPHONE 2 MG/ML INJECTION SYRINGE: 2 2 mg/mL | INTRAMUSCULAR | Qty: 1

## 2015-09-11 MED FILL — TRAVASOL 10 % INTRAVENOUS SOLUTION: 10 10 % | INTRAVENOUS | Qty: 860

## 2015-09-11 NOTE — Unmapped (Signed)
Physical Therapy   Reason Patient Not Seen         Name: Linda Ponce  DOB: [birth year]  Attending Physician: Valda Lamb, MD  Admission Diagnosis: jewish 07/05/15 dx: sepsis  Date: 09/11/2015  Reviewed Pertinent hospital course: Yes    Pt refused PT services this date, reporting pain in abdomen. Pt stated she was waiting on wound care and also stated It hurts so much. It even hurts to talk. Please don't make me talk. Nursing notified. Will follow up next treatment date.    Continue POC and collaboration w/PT.    Rudean Hitt, PTA [phone]

## 2015-09-11 NOTE — Unmapped (Signed)
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   Adequate intake via PO and TPN to support healing of areas on skin as diagnosis allows.   Outcome: Not Progressing  Weight decreased  from last week at 95 lb to 88lb   May be in part r/t fluid status, fluid losses with fistulas - d/w Pharmacy poss increase of TPN to 2000 ml  Taking minimal PO fluids now  Tolerates TPN without complications.   BUN  Remains elevated, se Na and Osmo WNL  Blood sugars in adequate control   LFT WNL x Alk phos trending upward  Albumin remains low - likely r/t chronic inflammation.   Wounds no change or improved.

## 2015-09-11 NOTE — Unmapped (Signed)
Clinical Pharmacy Service - TPN Progress Note    Linda Ponce is followed by the pharmacy department for monitoring and adjustment of TPN    Assessment/Plan:   ?? No new labs, check renal panel tomorrow  ?? Continue TPN with electrolytes, macronutrients and additives from yesterday    Subjective/Objective:    Recent Labs      09/09/15   0413   NA  138   CL  105   CO2  26   BUN  32*   CREATININE  0.58*   K  4.2   PHOS  3.9   MG  2.0   CALCIUM  8.0*   FCAS  4.73   ALBUMIN  <1.5*   <1.5*   GLUCOSE  116*   ALKPHOS  206*   AST  14   ALT  16   BILITOT  0.2   BILIDIRECT  0.12   TRIG  72     Recent Labs      09/08/15   0937  09/10/15   0628  09/11/15   0535   POCGMD  115*  109*  100      Insulin Orders       Dose Frequency Start End    insulin regular (HumuLIN R) injection Soln 0-5 Units 0-5 Units Daily 07/26/2015     Route: Subcutaneous           Diet Orders          TPN ADULT ([facility]) at 75 mL/hr starting at 05/31 1800    TPN ADULT ([facility]) at 75 mL/hr starting at 05/30 1800    Diet NPO effective now Except for: except ice chips, except sips of clears starting at 03/28 0959        TPN Medication Recent History (Show up to 1 orders; newest on the left.)     Start date and time   09/11/2015 1800      TPN ADULT ([facility]) [[id number]]    Order Status  Active       Macro Ingredients    amino acid 10%  47.8 g/L    dextrose 70%  162.8 g/L       QS Base    sterile water  324.33 mL       Lipids    fat emulsion 30 %  19.5 g/L       TPN Electrolytes entered as PER DAY amount    sodium chloride  90 mEq    magnesium sulfate  20 mEq    calcium gluconate  9.6 mEq    potassium chloride  18 mEq    potassium phosphate  18 mmol       TPN Additives entered as PER DAY amount    MVI adult no. 1 with vitamin K  10 mL    ascorbic acid (vitamin C)  300 mg    trace elements (MULTITRACE-5)  1 mL    zinc chloride  5 mg       Calorie Contribution    Proteins  344 kcal    Dextrose  997.22 kcal    Lipids  351 kcal    Total  1,692.22 kcal        Electrolyte Ion Calculated Amount    Sodium  90 mEq    Potassium  44.4 mEq    Calcium  9.6 mEq    Magnesium  20 mEq    Aluminum  --    Phosphate  18 mmol    Chloride  108 mEq    Acetate  --       Other    Total Protein  86 g    Total Protein/kg  2.15 g/kg    Glucose Infusion Rate  5.09 mg/kg/min    Osmolarity  1,487.79    Volume  1,800 mL    Rate  75 mL/hr    Dosing Weight  40 kg    Infusion Site  [facility]           Thank you for the consult,  Andree Elk, [facility]  09/11/2015 8:37 AM

## 2015-09-11 NOTE — Unmapped (Signed)
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 Presented to [facility] with abdominal distention and fevers.?? Identified as having pelvic and intraabdominal abscess - drained by IR?? - mixed enteric flora with no predominance.?? Readmitted to [facility] with bleeding for wound.?? Course complicated by ischemia to right foot..??     Subjective & Interval History;    Pt examined at the bedside.  She was somnolent as i entered into the room but start crying when i called her.  She was upset for decreasing IV Dilaudid.  Requesting to increase it again.  I offered her that i can increase Oxycodone for her  From 20 mg to 25 mg  q 4 hr but she was not willing for that.  I Could not rule out her addictive behavior.  Dr. Minerva Ends saw her and recommended the same as i discussed with the pt.    On exam pt was looking on dry side.  Fluid was increased in TPN.       Review of Systems:  10 point ROS was negative except as above.   Denies any fever,chill, headache,N/V, CP ,SOB,Cough, Palpitation. Denies any joint pain, skin rash,anxiety or depression.      PMH: Unchanged from H&P.   PSH: Unchanged from H&P.   Family medical history: Unchanged from H&P.   Social history: Unchanged from H&P      Scheduled Meds:  ??? clopidogrel  75 mg Per NG tube Daily 0900   ??? enoxaparin  40 mg Subcutaneous Daily 0900   ??? famotidine (PF)  20 mg Intravenous BID   ??? fentaNYL  1 patch Transdermal Q72H   ??? gabapentin  150 mg Oral 3 times per day   ??? insulin regular  0-5 Units Subcutaneous Daily 0900   ??? metoprolol tartrate  50 mg Oral BID   ??? mirtazapine  15 mg Oral Nightly (2100)   ??? oxyCODONE  20 mg Oral Q4H2 SCH     Continuous Infusions:  ??? TPN ADULT ([facility]) 75 mL/hr at 09/10/15 1752     PRN Meds:ALPRAZolam, alteplase, dextrose, dextrose 50 % in water (D50W), diclofenac sodium, heparin lock flush, HYDROmorphone,  ipratropium-albuterol, LORazepam, ondansetron    Objective:    Vital signs in last 24 hours:  Temp:  [96.8 ??F (36 ??C)-99.7 ??F (37.6 ??C)] 99.7 ??F (37.6 ??C)  Heart Rate:  [132-139] 139  Resp:  [20-24] 24  BP: (100-101)/(58-61) 100/58 mmHg    Physical Examination:    GENERAL: The patient is awake,  ,not in acute cardiorespiratory distress. Looking chronically sick.   HEENT:Atraumatic.Normocephalic. PERRLA, EOMI. No icterus, no pallor, no jugular venous distention..No ear or nose discharge.  NECK: Supple. Trachea is in midline. No Thyromegaly  CHEST: Symmetric. Clear to auscultation, bilateral equal air entry. No wheezing or rhonchi.  CARDIOVASCULAR: S1, S2, Regular pulse. No murmur or gallops. No JVD.  ABDOMEN: Whole anterior abdomen is covered with pouch  EXTREMITIES: There is no edema . Right foot has dressing in place.   Skin;No bruise,No rash.No petechiae.  CNS;  No focal neurological deficit.        Intake/Output last 3 shifts:  I/O last 3 completed shifts:  In: 1646   Out: -     Recent Labs;  Lab name 09/02/15  0359 09/05/15  0643 09/09/15  0413   WBC 8.7 8.7 6.2   HEMOGLOBIN 7.6* 7.9* 7.1*   HEMATOCRIT 23.8* 25.0* 23.0*   MEAN CORPUSCULAR VOLUME 89.9 89.1 90.0   PLATELETS 575* 534* 521*                                                            Lab name 09/02/15  0359 09/05/15  0643 09/09/15  0413   SODIUM 137 136 138   POTASSIUM 4.5 4.7 4.2   CREATININE 0.54* 0.59* 0.58*   BUN 30* 33* 32*   CHLORIDE 104 102 105   CO2 28 28 26    PHOSPHORUS 4.1 3.9 3.9   MAGNESIUM 2.0 2.1 2.0                         Lab name 08/19/15  0520 08/26/15  0521 09/02/15  0359 09/09/15  0413   AST 23 23 14 14    ALT 26 17 17 16    BILIRUBIN TOTAL 0.3 0.3 0.2 0.2       Lab name 07/05/15  0702  07/09/15  0544  09/02/15  0359 09/05/15  0643 09/09/15  0413   PREALBUMIN 14.0*  --  16.0*  --   --   --   --    ALBUMIN  --   < > 1.5*  < > <1.5*   <1.5* <1.5* <1.5*   <1.5*   < > = values in this interval not displayed.                                                                                                    Lab name 09/05/15  0643 09/09/15  0413   CALCIUM 8.0* 8.0*   PHOSPHORUS 3.9 3.9                          Lab name 09/02/15  0359 09/09/15  0413   TRIGLYCERIDES 89 72                        Lab name 08/05/15  0900 08/07/15  0850 08/10/15  0830   VANCOMYCIN TROUGH 9.3* 9.1* 17.4       CT abdomen 3/24  1. Multiple abscess, large pelvic abscess with surgical drain,  right issue rectal fossa.  2. 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  3. Granulation tissue enhancement or blood extravasation in the  lower abdominal wall as described  4. Proximity of the small bowel vasculature at the level of the  open wound on the right, side of active bleeding  5. Lower lobe bronchopneumonia and aspiration is also considered    Diet;    Diet Orders  TPN ADULT ([facility]) at 75 mL/hr starting at 05/30 1800    Diet NPO effective now Except for: except ice chips, except sips of clears starting at 03/28 0959            Assessment/Plan:      1. Peritoneal abscess- Mixed flora, pigtail drain 06/26/15 . Vanco/Zosyn/Diflucan.           -Completed  6 week Abx on 08/12/15.      2. Enterocutaneous Fistula- wound care, bowel rest. Still has significant output.    3. Severe intractable acute on chronic pain syndrome- receiving Dilaudid IV every 3 hr  Prn,Fentanyl patch decreased to 25  from 50  mcg. Started PO Oxycodone scheduled as she had better response with it previously. Added Gabapentin as she was on previously.         Will gradually decrease the IV dilaudid dose. Decreased to 1 mg IV Q 4 hr  on 5/31 . Referred  to pain management.  Clinic. Increased       Oxycodone to 20 mg po Q4hr on 5/30.  . Will try to decrease IV Dilaudid to 1 mg IV Q4 hr tomorrow.         For depression, Remeron was added on 5/24. Will monitor her behavior. Pt is non compliant with most of the treatment offered to  her.     4. Thrombocytosis. uncertain etiology- follow closely. Likely reactive.     5. Cachexia-   continue TPN, recheck PAB. Albumin stays less than 1.5.    6. Sinus tachycardia. Likely related to pain. Monitor.Contine  Metoprolol with holding parameter. Pt refusing to take it.     7. Ischemic feet/arterial thromboemboli- The patient had a clot in her femoral artery right leg that was cleared by vascular surgery.  However the patient ended up with necrosis at her distal digits which may be a combination of lack of blood flow and the use of pressors. Refusing heparin, aspirin and Plavix. Refused all heparin injections previously as well. Canceled aspirin but continue to encourage Plavix..           Following podiatrist at [facility]. Getting better.     8. History of colon cancer s/p exenteration with bilateral nephrostomy tubes    9. Recurrent presacral abscesses. Completed the course of Abx.    10. Debility- PT/OT. Pt is not participating most of the times.   11. Dysphagia. Feel choking sensation on medication. SLP following. Pt with min oral dysphagia 2/2 anterior spillage with saliva, but none with PO. Pt was able to use a straw without difficulty. ??    On TPN. 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 Can not tolerate PO. Weekly LFT while on TPN. TPN being managed by pharmacy. Electrolytes replacement through TPN. LFT are WNL so far. Fistula are not healing well. Not a candidate for enteric feeding or PEG placement because everything is coming out.   Will need TPN for at least 3-6 month depending the fistula healing.     12. D/C planning . Family meeting held on  5/24. [name]  Pt need extensive wound care, TPN, IV Pain meds which are hurdle to discharge.        Goal is set to d/c her with home care in first week of June.     13. Nausea.  Zofran.     14.  New JP drain was placed on 5/16. F/u IR for drain management as OP. Appointment with Dr. Rushie Chestnut on 6/6.     A/P  reviewed and updated.  Increased PO Oxycodone to 20 mg q 4 hr. decreased IV  Dilaudid to 1 mg q 4 hr PRN break through pain.    Pt is not taking her Plavix, Mirtazapine, Gabapentin, Metoprolol. Refusing to take Lovenox for DVT prevention. MAR reviewed.   Pt states that these are too many medication to take and she is not eating. It hurts her stomach.     Prophylaxis;  GI Prophylaxis: Pepcid  DVT Prophylaxis: Lovenox     Code Status: Full Code      Alieyah Spader MD.       09/11/2015

## 2015-09-11 NOTE — Unmapped (Signed)
[facility] Inpatient Medical Psychology Service  [place] Department of Neurology & Rehabilitation Medicine  Division of Neuropsychology & Medical Psychology     INPATIENT MEDICAL PSYCHOLOGY PROGRESS NOTE    Patient Name:  Linda Ponce          DOB:  Dec 25, 1970  Admit Date:  07/08/2015   CSN:  [id number]    Referring Provider:  Valda Lamb, MD  Location:  [name] Room N319/DN319-02     Current Date: 09/11/2015  Time and Duration of Service: 3:30p-4:20p = 50 minutes face to face time    CPT Code:  57846 - Health & Behavior Intervention - Individual - 3 units, 50 min face to face time.    Current Problem List:      Patient Active Problem List   Diagnosis   ??? Abdominal abscess   ??? Enterocutaneous fistula   ??? Malnutrition   ??? Fistula   ??? Adjustment disorder with mixed anxiety and depressed mood   ??? Pain of multiple sites   ??? Cognitive dysfunction-recent onset.       Current Medications:     Scheduled Medications:    ??? clopidogrel  75 mg Per NG tube Daily 0900   ??? enoxaparin  40 mg Subcutaneous Daily 0900   ??? famotidine (PF)  20 mg Intravenous BID   ??? fentaNYL  1 patch Transdermal Q72H   ??? gabapentin  150 mg Oral 3 times per day   ??? insulin regular  0-5 Units Subcutaneous Daily 0900   ??? metoprolol tartrate  50 mg Oral BID   ??? mirtazapine  15 mg Oral Nightly (2100)   ??? oxyCODONE  20 mg Oral Q4H2 SCH     PRN Medications:  ALPRAZolam, alteplase, dextrose, dextrose 50 % in water (D50W), diclofenac sodium, heparin lock flush, HYDROmorphone, ipratropium-albuterol, LORazepam, ondansetron      Objective Presentation/Subjective Concerns and Related Interventions:      ?? Pt was dosing when I entered; no pain behaviors until she woke.   ?? She reported being in severe pain; checked chart and her scheduled oxycodone was 2 hours late. This was unfortunate as pt's dilaudid was reduced to 1 mg today as well--she truly was in severe pain.   ?? She was also upset that she wasn't  warned of the decrease, begged me to ask her MD to increase it (which we discussed was undesireable). She also does not have enough time for an increase to 1.5 mg to wean her off by her discharge date of 09/17/15 and allow several days for oxycodone adjustments.  ?? She took late oxycodone while I was there, and tearfully reported burning and cramping which lasted about 5-10 min. I encouraged her to drink water to dilute it, but she would not do it.    ?? Interventions:   ?? Previously discussed dilaudid taper issue with Dr Cloretta Ned after he met with Edeline. Plan was to make future reductions by 0.5 mg. I suggest 2 days as each level.  ?? Assisted in informing charge RN of her late oxycodone who delivered it quickly.  ?? Provided support and institutional apology for her late oxycodone dose and the increased pain that delay caused.   ?? Discussed the dilaudid taper at length. Focused on the fact that there are not enough days to taper her off by discharge if Dr Cloretta Ned increases the dose to 1.5 mg first, assuming allowing 2 days per 0.5 mg reduction between  today and 6/6, including a two day allowance for oxycodone adjustments. It is important to externalize the reasons for reducing it as this is being done to reduce her personalizing the action.  ?? Offered to contact her MD to increase the oxycodone to 25 mg q4h scheduled as he offered to do. She declined (I can have that done when I leave--a maladaptive attitude which I pointed out to her). Informed her that she can have nursing page me if she changes her mind.    Psychiatric Diagnoses:      ?? Adjustment disorder with mixed anxiety and depressed mood.  ?? Related to prolonged hospitalization and inadequately controlled pain in abdomen, back, and legs.  ?? She has had NO prior treatment for anxiety or depression or substance use disorders.   ?? Started taking Remeron Soltab mg qhs on 09/05/15.    ?? Pain of Multiple Sites.    ?? 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 Pt reports severe pain ranging from 8-10/10 even with fentanyl 125 mcg patch. Dilaudid IV q3h prn reduces it to 5/10 but does not last.  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 No history of narcotic abuse, but she has been on narcotics for abdominal and radiation pain from 2011 cancer surgery and complications x 6 yrs. Best regimen per pt post op at [facility] was oxycodone 15 mg q4h scheduled and dilaudid 1 mg q3h prn which she tapered down before discharge.She also took neurontin 300 mg TID when home prior to surgery for neuropathic pain (low back).  Psychosocial losses (death of father 1 week before surgery, missing teen son who is in [place] w/her mother) reduce ability to cope with pain and hospitalization.     ?? Cognitive dysfunction--recent onset.    ?? Likely residual of significant delirium at [facility], pain medications, distraction from pain itself, and possibly IV ABX. Will monotor. Primarily involves orientation to time, and somewhat to place, short term recall, and working memory. Expected to improve as pt's wounds and infections improve.  ?? MMSE-2 score of 21/20 (T=21) indicates moderate cognitive impairment involving orientation, short term recall, and working memory.   ?? Clinical assessment of common sense reasoning skills indicated normal limits reasoning skills except when her pain gets severe, at which point she becomes fearful and suspicious about medications being withheld because she bother's the nurses too much.      RECOMMENDATIONS -- DR Cloretta Ned:    ?? Mckynlee wants you to warn her before future dilaudid reductions occur so she can get her mind ready for it.  Will likely result in tearful requests to not do it, but being warned is a  reasonable request.    ?? At present, pt refused to have me ask about increasing her oxycodone again as you told her you are willing to do.    ?? She continues to want dilaudid increased to 1.5 mg, however, if she leaves the 6th as planned, there will not be enough days to taper her down from 1.5 mg by 0.5 mg increments assuming 2 days at each level, and then to have a few days for oxycodone adjustments before discharge. Unfortunately, time does not permit an increase in dilaudid, and I recommend using this explanation which I have already used  this session.    ?? As she won't take medication for anxiety, please consider scheduling Zofran. She uses it 2-3x/day, but often asks later then necessary, resulting in more nausea and non-compliance with other medication.      NURSING:    ?? Please ensure Daveena's oxycodone is given every 4 hours as noted in Dr Nolene Ebbs order's administration instructions. This dilaudid taper is very difficult for Jacole, and her pain is significant without her scheduled oxycodone.       Med Psychology Plan:    ?? I will see her on Friday to again attempt to teach her cognitive restructuring strategies or at least cope with dilaudid taper.      Neima Lacross P. Minerva Ends, PhD  Licensed Insurance risk surveyor in Medical Psychology  [place] Department of Neurology & Rehabilitation Medicine  Division of Neuropsychology & Medical Psychology  Office Phone for non-urgent messages/requests:  [phone]  Pager--urgent issues only: [phone]    09/11/2015

## 2015-09-11 NOTE — Unmapped (Signed)
Occupational Therapy   Reason Patient Not Seen         Name: Linda Ponce  DOB: 09/22/1970  Attending Physician: Valda Lamb, MD  Admission Diagnosis: jewish 07/05/15 dx: sepsis  Date: 09/11/2015    Reviewed Pertinent hospital course: Yes    Unable to see patient due to :Pain     Patient supine in bed with towels over abdomen upon approach. Patient shook her head no and crying stated that she could not speak because it made her feel worse.     Amy Cherre Blanc, OTR/L [phone]  LTAC at the [facility]  Tues., Wed. and Friday 9:00-5:30  [address] [place] Therapy Office: [phone]

## 2015-09-11 NOTE — Unmapped (Signed)
Patient continuously complained of pain in abdominal area pain medication given; some relief obtained but patient requested more pain medication before the next dose was due; explanation given as of why pain medication frequency needs to be strictly followed. Patient wanted to have prn dilaudid after she woke up closer to scheduled Oxycodone; patient urged to take oxycodone as per the schedule and to have dilaudid after two hours in case oxycodone does not work

## 2015-09-11 NOTE — Unmapped (Signed)
Linda Lim, RN PICC Team asked me to discuss/review with the Pt. That her IJ line was inserted with the intent of being a temporary line.  I reviewed this information with the Patient and discussed the increased risk of infection.  Adding that we would like to D/C the line and start using her Pro A Cath.  The Patient began crying, stating [facility] doesn't work and it hasn't worked in a long time, I keep telling them that.  I don't want to take this line out and have everybody poking me.      I then attempted to comfort and calm her, changed her IJ line dressing.  I advised Linda Lim, RN PICC of the same.

## 2015-09-11 NOTE — Unmapped (Signed)
Problem: Knowledge Deficit  Goal: Patient/family/caregiver demonstrates understanding of disease process, treatment plan, medications, and discharge instructions  Complete learning assessment and assess knowledge base.   Intervention: Provide teaching at level of understanding  Patient educated about care of skin especially abdominal skin, to allow nurses to take care of the dressing and cleanse the drainage to prevent skin breakdown; patient appeared to understand the importance of the teaching but still would not allow nurse to take care of the abdominal dressings, colostomy and fistulas and nephrostomy

## 2015-09-11 NOTE — Unmapped (Signed)
[name]  Nutrition Progress Note    Nutrition Diagnosis:   1. Altered GI function - Continues    New Nutrition Diagnosis:  No    Diet: NPO with ice chips and sips of clears.   TPN: Total Volume 1800 ml/day rate 75 ml/hr:  AAcids 86g/day, Dextrose 293g/day, Lipid  35.1g/d per day to provide kcal  1,692/day  Weight: (!) 88 lb 3 oz (40.002 kg) (09/08/15 0701)     Skin Condition:   Left lateral abdomen fistula no change, Mid abdominal wound with fistula no change,   Right foot vascular wounds no significant change, Right toes pt refused assessment,  Right lateral thigh no significant change, coccyx stage 3 resolved  Edema: bilateral LE nonpitting to +1 noted per Nsg    Labs:   Lab Results   Component Value Date    PREALBUMIN 16.0* 07/09/2015     Lab Results   Component Value Date    ALBUMIN <1.5* 08/26/2015     Lab Results   Component Value Date    ALT 16 09/09/2015    AST 14 09/09/2015    ALKPHOS 206* 09/09/2015    BILITOT 0.2 09/09/2015     Lab Results   Component Value Date    TRIG 72 09/09/2015     Lab Results   Component Value Date    CREATININE 0.58* 09/09/2015    BUN 32* 09/09/2015    NA 138 09/09/2015    K 4.2 09/09/2015    CL 105 09/09/2015    CO2 26 09/09/2015     Lab Results   Component Value Date    OSMOLALITY 294 09/09/2015     Lab Results   Component Value Date    CALCIUM 8.0* 09/09/2015    PHOS 3.9 09/09/2015    GLUCOSE 116* 09/09/2015        Lab Results   Component Value Date    WBC 6.2 09/09/2015    HGB 7.1* 09/09/2015    HCT 23.0* 09/09/2015    MCV 90.0 09/09/2015    PLT 521* 09/09/2015         Component Value Date/Time    POCGMD 100 09/11/2015 0535    POCGMD 109* 09/10/2015 0628    POCGMD 115* 09/08/2015 0937    POCGMD 109* 09/07/2015 0803    POCGMD 103* 09/06/2015 0731     No results found for: VITD25H      Scheduled Meds:  ??? clopidogrel  75 mg Per NG tube Daily 0900   ??? enoxaparin  40 mg Subcutaneous Daily 0900   ??? famotidine (PF)  20 mg Intravenous BID   ??? fentaNYL  1 patch Transdermal  Q72H   ??? gabapentin  150 mg Oral 3 times per day   ??? insulin regular  0-5 Units Subcutaneous Daily 0900   ??? metoprolol tartrate  50 mg Oral BID   ??? mirtazapine  15 mg Oral Nightly (2100)   ??? oxyCODONE  20 mg Oral Q4H2 SCH     Continuous Infusions:  ??? TPN ADULT ([facility]) 75 mL/hr at 09/10/15 1752   ??? TPN ADULT ([facility])       PRN Meds:.ALPRAZolam, alteplase, dextrose, dextrose 50 % in water (D50W), diclofenac sodium, heparin lock flush, HYDROmorphone, ipratropium-albuterol, LORazepam, ondansetron    Note: Weight decreased further to 88lb (loss of 7% from last week) May be in part due to low fluid volume. Per discussion with Pharmacy this AM consider increase to 2000 ml/day. BUN remains elevated. Serum Na  and osmolality WNL. Calories increased to 1692 on 5/26 , previously on 4/21 r/t wt loss.  Pt with increased calorie needs at 42kcal/kg r/t hypermetabolism with hx colon cancer, nutrient losses per fistulas, and increased needs to aid wound healing. Weight has decreased from 100 lb in 3/17, and decreased from reported hx of UBW of 105lb. Weight last week was 95.1 lb, weight has fluctuated in 85-95 lb range over past 30 days.  Weight has decreased to low BMI of 15.9.  TPN infusing without any complications.   Alk Phos trending upward.   AST/ALT WNL. TBili and Triglycerides WNL.  Blood sugars in adequate control. blood sugar check changed to 1 x daily.  Ascorbic acid added to each bag and zinc increased r/t GI output and wound healing.  Albumin remains low at <1.5 likely r/t chronic inflammation. No new CRP level. Appears to have minimal intake of ice chips and clears.     Nutrition Prescription:   Food and Nutrition Therapy: continue current TPN formulation.  Monitoring: weights, hydration,  Labs, skin condition    Plan of Care:Update/revised    Follow Up: Other: 7-14 days    Mackey Birchwood RDN, LD  Pager [phone] Phone [phone]    09/11/2015 4:36 PM

## 2015-09-12 LAB — RENAL FUNCTION PANEL W/EGFR
Albumin: <1.5 g/dL (ref 3.5–5.7)
Anion Gap: 8 mmol/L (ref 3–16)
BUN: 38 mg/dL (ref 7–25)
CO2: 28 mmol/L (ref 21–33)
Calcium: 8.5 mg/dL (ref 8.6–10.3)
Chloride: 98 mmol/L (ref 98–110)
Creatinine: 0.63 mg/dL (ref 0.60–1.30)
Glucose: 89 mg/dL (ref 70–100)
Osmolality, Calculated: 287 mOsm/kg (ref 278–305)
Phosphorus: 3.6 mg/dL (ref 2.1–4.7)
Potassium: 4.7 mmol/L (ref 3.5–5.3)
Sodium: 134 mmol/L (ref 133–146)
eGFR AA CKD-EPI: >90 See note.
eGFR NONAA CKD-EPI: >90 See note.

## 2015-09-12 LAB — CBC
Hematocrit: 24.8 % (ref 35.0–45.0)
Hemoglobin: 7.8 g/dL (ref 11.7–15.5)
MCH: 27.5 pg (ref 27.0–33.0)
MCHC: 31.5 g/dL (ref 32.0–36.0)
MCV: 87.3 fL (ref 80.0–100.0)
MPV: 7 fL (ref 7.5–11.5)
Platelets: 642 10*3/uL (ref 140–400)
RBC: 2.84 10*6/uL (ref 3.80–5.10)
RDW: 16.9 % (ref 11.0–15.0)
WBC: 9.7 10*3/uL (ref 3.8–10.8)

## 2015-09-12 LAB — MAGNESIUM: Magnesium: 2 mg/dL (ref 1.5–2.5)

## 2015-09-12 LAB — POC GLU MONITORING DEVICE: POC Glucose Monitoring Device: 125 mg/dL (ref 70–100)

## 2015-09-12 MED ORDER — TPNADULTWCHDrake
INTRAVENOUS | Status: AC
Start: 2015-09-12 — End: 2015-09-12

## 2015-09-12 MED ORDER — TPN ADULT (WCH/Drake)
4 | INTRAMUSCULAR | Status: AC
Start: 2015-09-12 — End: 2015-09-13
  Administered 2015-09-12: 23:00:00 via INTRAVENOUS

## 2015-09-12 MED ORDER — sodium chloride 0.9 % infusion 500 mL
Freq: Once | INTRAVENOUS | Status: AC
Start: 2015-09-12 — End: 2015-09-12
  Administered 2015-09-12: 19:00:00 500 mL via INTRAVENOUS

## 2015-09-12 MED FILL — FAMOTIDINE (PF) 20 MG/2 ML INTRAVENOUS SOLUTION: 20 20 mg/2 mL | INTRAVENOUS | Qty: 2

## 2015-09-12 MED FILL — HYDROMORPHONE 1 MG/ML INJECTION SYRINGE: 1 1 mg/mL | INTRAMUSCULAR | Qty: 1

## 2015-09-12 MED FILL — GABAPENTIN 250 MG/5 ML ORAL SOLUTION: 50 50 mg/mL | ORAL | Qty: 5

## 2015-09-12 MED FILL — TRAVASOL 10 % INTRAVENOUS SOLUTION: 10 10 % | INTRAVENOUS | Qty: 860

## 2015-09-12 MED FILL — OXYCODONE 20 MG/ML ORAL CONCENTRATE: 20 20 mg/mL | ORAL | Qty: 1

## 2015-09-12 MED FILL — TRAVASOL 10 % INTRAVENOUS SOLUTION: 10 10 % | INTRAVENOUS | Qty: 956

## 2015-09-12 MED FILL — CLOPIDOGREL 75 MG TABLET: 75 75 mg | ORAL | Qty: 1

## 2015-09-12 MED FILL — METOPROLOL TARTRATE 50 MG TABLET: 50 50 MG | ORAL | Qty: 1

## 2015-09-12 MED FILL — ENOXAPARIN 40 MG/0.4 ML SUBCUTANEOUS SYRINGE: 40 40 mg/0.4 mL | SUBCUTANEOUS | Qty: 0.4

## 2015-09-12 MED FILL — ONDANSETRON HCL (PF) 4 MG/2 ML INJECTION SOLUTION: 4 4 mg/2 mL | INTRAMUSCULAR | Qty: 2

## 2015-09-12 NOTE — Unmapped (Signed)
Patient refusing to let nurse or wound care team assess her wounds.  HR 150+ when anxious; Dr. Cloretta Ned aware, 500cc bolus given.  HR 130, Dr. Cloretta Ned aware of post bolus vitals and stated to continue to monitor.  Patient began to calm down after bolus complete.  Still continuously requesting IV dilaudid only.

## 2015-09-12 NOTE — Unmapped (Addendum)
Patient refusing all medications, including oral oxycodone, except for IV dilaudid.  Says her stomach is in pain, and refusing IV medications as well. States that she needs more than just 1mg .  Patient began begging for pain medication two hours after last administration.  Refusing to let nurse see her abdomen or to reposition her.  Refusing to be seen by wound care.  Educated on importance of wound care and photo documentation of her wounds.  She states her sister can do it.

## 2015-09-12 NOTE — Unmapped (Signed)
Patient refused all her scheduled medication and refused care of her wounds and colostomy and nephrostomy; patient refused oxycodone stating that its causes her to get stomach upset but did not give any other reason for refusing to take evn the IV medication such as zofran. Explanation was given to the patient that she has to take oxycodone and in case pain does not alleviate to have dilaudid two hour later after oxycodone administration. Patient cried most of times asking to have more dilaudid within less than two hours of the previous dose administration. Patient could not allow for assessment of abdominal fistulas, the dressing and colostomy stating that her legs were hurting and did not want anybody to touch even her blanket

## 2015-09-12 NOTE — Unmapped (Addendum)
Patient requested to speak with me.  Complaining of excrutiating pain.  Said she had no forwarning of decrease of IV Dilaudid.  Requesting IV Dilaudid right now.  I spoke with unit nurse and charge nurse.  She refused the pain medication available to her (oxycodone), stating it would hurt her stomach.  I reviewed medical records and saw where IV Dilaudid was being decreased, while oxycodone was being increased.  Psychology is following case and make recommendations as well.  Dr. Cloretta Ned notified of complaints of pain.  Patient begged CM to stop discharge planning - states she is not ready and knows she will be right back in the hospital if she is discharged next week.  Will discuss with IDT and Dr.

## 2015-09-12 NOTE — Unmapped (Signed)
Problem: Compromised Skin Integrity  Goal: LTG - Patient will be free from infection  Outcome: Progressing  Monitor vital signs and lab values for signs of infection.

## 2015-09-12 NOTE — Unmapped (Signed)
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 Presented to [facility] with abdominal distention and fevers.?? Identified as having pelvic and intraabdominal abscess - drained by IR?? - mixed enteric flora with no predominance.?? Readmitted to [facility] with bleeding for wound.?? Course complicated by ischemia to right foot..??     Summary: Pt presents without oropharyngeal dysphagia with thin liquids; pt is on clear liquid diet thus no other PO trials completed.    Recommendation  No ST services at this time    Pain:  Pain Score:   8  Pain Location: Leg  Pain Descriptors: Aching  Pain Intervention(s): Medication (See eMAR)      Goals    n/a    Oral/Motor   lingual strength      Thin  Water: no overt s/s aspiration/penetration    Pt reported that months ago, she had to have endoscopy to remove meat that was stuck in throat. Since this event pt has felt cautious with eating. At this time she is on clear liquids only. Pt denies difficulty with liquids. Pt's only medication taken orally is liquid.      Time  Start Time: 1400  Stop Time: 1430  Time Calculation (min): 30 min    Charges   $Clinical Swallow: 1 Procedure     Azalia Bilis [facility] MA [facility] SLP [phone]  Speech-Language Pathologist  Pager# [phone]

## 2015-09-12 NOTE — Unmapped (Signed)
Patient has an appointment with her new PCP Dr. Everlena Cooper on June 28 at 11:30 am. This appointment is on the patient's AVS. Dr. Gerilyn Pilgrim office would like a discharge summary faxed to them at D/C. Fax [phone].

## 2015-09-12 NOTE — Unmapped (Signed)
Problem: Potential for Compromised Skin Integrity  Goal: Skin integrity is maintained or improved  Assess and monitor skin integrity. Identify patients at risk for skin breakdown on admission and per policy. Collaborate with interdisciplinary team and initiate plans and interventions as needed.   Outcome: Not Progressing  Patient refusing wound care treatment

## 2015-09-12 NOTE — Unmapped (Signed)
Problem: Daily Care  Goal: Daily care needs are met  Assess and monitor ability to perform self care and identify potential discharge needs.   Patient refusing daily care.  Refusing to allow nurse to assess wounds, and reposition.

## 2015-09-12 NOTE — Unmapped (Signed)
Clinical Pharmacy Note:          Per note from 09/11/15      Discussed with MD and RD, not clear how much fluid loss through Fistula but it is likely significant. MD and RD both agree to 2L total volume (50 L/kg). Increase total volume of TPN to 2000 mL, keep macros the same g/day. Do not recommend increasing total volume anymore then 2L this is the upper limit. Will notify nurse to report any changes patient has in breathing. Increased volume may decrease serum sodium slightly.           New total Volume 2000 ml - 83.33ml/hr    S:   TPN Dosing by Pharmacy   O:   Lab Results   Component Value Date/Time    SODIUM 134 09/12/2015 05:26 AM    POTASSIUM 4.7 09/12/2015 05:26 AM    CO2 28 09/12/2015 05:26 AM    CHLORIDE 98 09/12/2015 05:26 AM    GLUCOSE 89 09/12/2015 05:26 AM    MAGNESIUM 2.0 09/12/2015 05:26 AM    PHOSPHORUS 3.6 09/12/2015 05:26 AM    CALCIUM 8.5* 09/12/2015 05:26 AM    ALBUMIN <1.5* 09/12/2015 05:26 AM    TRIGLYCERIDES 72 09/09/2015 04:13 AM            A:   Daily Lipids, MVI, TE.   P:   Labs WNL   Thanks.    Linda Ponce Linda Ponce  09/12/2015  8:28 AM

## 2015-09-12 NOTE — Unmapped (Addendum)
I received a phone call from [facility] from [facility] that they are unable to accept the patient at this time due to the patient needs. Joann reports that the nursing staff does not fell comfortable with the patient's wound care needs and the only nurse feel comfortable doing the patient's colostomy care l;ives 45 mintues out of the way and is out of the patient service area. Additional referrals will be sent out to other home care companies to see if they are able to provide services to the patient. Joann asked that if I am unable to find a nursing company to please let her know to see if can talk to upper management to see if the one nurse can get special permission to see this patient.      [facility] [employer] reports that the patient does qualify for a hospital bed and wheelchair and that there is a 30.00 monthly rental for the hospital bed and 11.00 per month rental for the weelchair. Patient's sister April reports that she has already rented a hospital bed and wheelchair for the month of June and wanted to know if [employer] can deliver the hospital bed with a trapeze bar, and the wheelchair with elevated legs to her home on June 30. I called [employer] and spoke Judeth Cornfield who told me that they would need home care orders signed they were going to fax over the order form that the physician needs to sign and fax it back to them.

## 2015-09-12 NOTE — Unmapped (Signed)
[place]    Linda Ponce   Wound Care Assessment  09/12/2015       Linda Ponce is an 45 y.o. female.  DOB: 12-18-1970    Pressure ulcers on admission:  One STAGE: III on coccyx              One UNSTAGEABLE on right lateral hip    Diagnosis: jewish 07/05/15 dx: sepsis    Allergies   Allergen Reactions   ??? Compazine [Prochlorperazine Edisylate]    ??? Latex, Natural Rubber        History:    Past Medical History   Diagnosis Date   ??? Cancer    ??? Abscess of abdominal cavity    ??? Gangrene of foot    ??? AKI (acute kidney injury)    ??? Fistula    ??? Peritonitis and retroperitoneal infections    ??? Cachexia    ??? Hyperkalemia    ??? Dehydration    ??? Encephalopathies    ??? Ischemic foot    ??? 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 Dilaudid IV q3h prn reduces it to 5/10 but does not last.  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 No hist     Past Surgical History   Procedure Laterality Date   ??? Colon surgery       Social History   Substance Use Topics   ??? Smoking status: Never Smoker    ??? Smokeless tobacco: None   ??? Alcohol Use: None     Present admission history of illness/MD note.  41f with history of colorectal CA, initially admitted to [facility] operatively for continued wound care. Developed bleeding, sent back to [facility] where she was foundto have multiple pelvic abscesses, enterocutaneous fistulas, was kept NPO, started on IV abx, TPN, and returns for continued care.    Skin/Wound Care Assessment.      Photos from last assessment, patient refusing to have dressing changed unless given IV pain medication. Asked nurse to give her pain medications prior to dressing changes and after nurse gave her pain IV medication, refused wound care team to  change and assess wounds. Patient very anxious and even after IV pain medication given, continuing to refuse wound care.        Braden: 16     Pressure wounds:    Coccyx STAGE:???? III??   Wounds progress: RESOLVED (08/20/15)    _____________________________________________________________________  Surgical wounds:????????????????????????????????????????????????????????????  Mid Abdominal Wound with fistula???? Full Thickness. Proximal Measurements: 6 cm L x 2.8 cm W x  D (pt did not allow depth to be taken)                                                                                          Distal Measurements: 3 cm L x 2 cm W x  D (pt did not allow depth to be taken)  Wounds progress: Decline-development of new fistula.  Wound Assessment:?? Wound bed with moist bright pink tissue, granulation tissue with areas of mucosal/bowel tissue.  New area opened inferior to mid abdominal fistula Area is moist, pink tissue draining large amounts of light brown effluent. Well defined edges, with some epithelial tissue noted at edges. Overall area decreasing in size. Large amount liquid gold brown effluent draining into foley bag. Periwound skin with increased erythema to right peri-stomal skin, blanchable.   Treatment/Dressing: Mid abdominal and new inferior fistula being managed with Eakins pouches attached to gravity drainage bags.  Clean wounds and surrounding skin with soap and water, pat dry. Attach Eakins pouch using Eakins rings to help with adherence. Also apply Eakins rings on small area of intact skin in between mid abdominal and lower mid abdominal fistula. Use pouch [id number] for mid abdomen and new lower mid abdomen fistula and smaller pouch [id number] for left abdomen. Change pouches weekly and prn for leakages.       ____________________________________________________________________  Surgical wounds:??????  ????Left Lateral Abdomen Fistula???? Full Thickness. Measurements L 3?? cm X W 0.5 cm X?? D 0.3 cm  (from previous assessment)  Wounds progress: UTA-Pt  would not allow wound team to remove pouch  Wound Assessment:??UTA  Treatment/Dressing:?? Mid abdominal and left abdominal fistula/wounds being managed with Eakins pouches attached to gravity drainage bags. Clean wounds and surrounding skin with soap and water, pat dry. Attach Eakins pouch using Eakins rings to help with adherence. Use pouch [id number] for mid abdomen and smaller pouch [id number] for left abdomen. Change pouches weekly and prn for leakages.      ____________________________________________________________________  Surgical wounds:????  Drain of left groin ?? ??  Wounds progress: No change  Wound Assessment: JP drain to site, not sutured in place but has securing device, small amount of reddish-brown drainage in bulb, no drainage noted to be leaking around insertion site, peri-wound with erythema  Treatment/Dressing: Keep proximal end of drain secured at all times using foley statlock; also tape slack of tubing to leg to prevent pulling. To manage drainage around insertion site: cleanse skin with saline, pat dry, apply Cavilon No Sting spray. Use small Eakin's pouch [id number]. Cut pouch to fit around drain tubing and cut opening in anterior window of pouch, disconnect tubing from JP bulb and statlock but hold tubing to keep secure and prevent pulling out. Feed tubing through pouch from back to front, adhere pouch to skin, resecure tube with statlock and reattach to compressed JP bulb, re-tape tubing to leg, use hy tape to seal opening on anterior portion of pouch. Change weekly and prn. Assess pouch frequently for needing emptied or may connect to foley bag.       _____________________________________________________________________  Surgical wounds:??????  Bilateral nephrostomy drains,??   Wounds progress:??UTA-pt refused assessment   Wound Assessment: UTA  Treatment/Dressing: Both to drainage pouches. Dressing changes every 3 days and prn for dressing failure. New dressing order: after clean with CHG, apply a slit  Mepilex border, securing around tubing to make it occlusive, then apply another Mepilex border to insure it is occlusive and to position tubing so that patient is not lying on tubing.    *Due to MASD resultant from fistula leakage, used crusting technique to peri-wound, placed layered split gauze dressing and secured with medipore tape. Pt did not want tegaderm or mepilex due to skin irritation.    Pictures below from last week's assessment     ____________________________________________________________________  Vascular Wounds on Right Foot????  Wounds progress:??UTA-pt refused assessment  Wound Assessment:??UTA  Treatment/Dressing: Per Dr. Luan Pulling (pt had appt on 4/25) paint toes daily with betadine, keep dry and keep pressure off toes. Also per appt notes, place 2 layers of tubi-grip to left lower leg from toes to knees - explained this to pt but she refused to allow this nurse to apply.   *Covered with dry rolled gauze after betadine, leaving ends of toes exposed, per pt request due to pt stating she can't stand when the sheets touch her bare foot.      First two images below are from last week's assessment      _____________________________________________________________________  Surgical wounds:  Colostomy/mucsous fistula ????RLQ    Wound Progress:  No change, stable   Wound Assessment:??Moist red budded stoma, peri-wound intact but with increased erythema superior to stoma and to the right of stoma (from fistula leakage). Scant amount of mucous drainage from ostomy, and scant amount thin liquid brown drainage in pouch   Wound Treatment: Right lower quadrant colostomy currently only functioning as mucous fistula. Keep covered with small Myna Bright [id number]. Change weekly and prn dressing failure.        ______________________________________________________________  Right lateral thigh   Stage II  Measures: L 1.5 cm x W 1.5 cm x D 0.01 cm  Wounds progress:?? No significant change  Wound Assessment:?? partial  thickness, moist pink tissue, well-defined attached edges, no drainage  Wound treatment: cleanse with saline, pat dry, cover with mepilex border, change every 3 days and prn.    No photo taken. Photo below from last week's assessment      ______________________________________________________________    Treatment time: approximately 2hr with 2 RNs  ______________________________________________________________  Preventative measures.    Off loading and pressure relief as measures are using an accumax mattress, Turning Wedge, pillows, and waffle boots to aid in off load of pressure points.  Other measures include lotion's and cream's to Hydrate and moisturize dry skin.  Moisture barrier cream [employer] clear is provided to protect groin and peri anal area.. Currently has foley catheter with statlock in place.         Labs:   Lab Results   Component Value Date    GLUCOSE 89 09/12/2015    BUN 38* 09/12/2015    CO2 28 09/12/2015    CREATININE 0.63 09/12/2015    K 4.7 09/12/2015    NA 134 09/12/2015    CL 98 09/12/2015    CALCIUM 8.5* 09/12/2015     Lab Results   Component Value Date    WBC 9.7 09/12/2015    HGB 7.8* 09/12/2015    HCT 24.8* 09/12/2015    MCV 87.3 09/12/2015    PLT 642* 09/12/2015     No results found for: PTT, INR  Lab Results   Component Value Date    PREALBUMIN 16.0* 07/09/2015     Lab Results   Component Value Date    ALBUMIN <1.5* 09/12/2015     Lab Results   Component Value Date    ESR 15 08/04/2015    CRP 96.9* 08/04/2015

## 2015-09-12 NOTE — Unmapped (Signed)
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 Presented to [facility] with abdominal distention and fevers.?? Identified as having pelvic and intraabdominal abscess - drained by IR?? - mixed enteric flora with no predominance.?? Readmitted to [facility] with bleeding for wound.?? Course complicated by ischemia to right foot..??     Subjective & Interval History;    Looking withdrawn.  No new complains.  Denies any fever,chill, headache,N/V, CP ,SOB,Cough, Palpitation, diarrhea or constipation.Denies any joint pain, skin rash,anxiety or depression.        Review of Systems:  10 point ROS was negative except as above.   Denies any fever,chill, headache,N/V, CP ,SOB,Cough, Palpitation. Denies any joint pain, skin rash,anxiety or depression.      PMH: Unchanged from H&P.   PSH: Unchanged from H&P.   Family medical history: Unchanged from H&P.   Social history: Unchanged from H&P      Scheduled Meds:  ??? clopidogrel  75 mg Per NG tube Daily 0900   ??? enoxaparin  40 mg Subcutaneous Daily 0900   ??? famotidine (PF)  20 mg Intravenous BID   ??? fentaNYL  1 patch Transdermal Q72H   ??? gabapentin  150 mg Oral 3 times per day   ??? insulin regular  0-5 Units Subcutaneous Daily 0900   ??? metoprolol tartrate  50 mg Oral BID   ??? mirtazapine  15 mg Oral Nightly (2100)   ??? ondansetron  4 mg Intravenous Q6H   ??? oxyCODONE  20 mg Oral Q4H2 SCH     Continuous Infusions:  ??? TPN ADULT ([facility]) 75 mL/hr at 09/11/15 1802   ??? TPN ADULT ([facility])       PRN Meds:ALPRAZolam, alteplase, dextrose, dextrose 50 % in water (D50W), diclofenac sodium, heparin lock flush, HYDROmorphone, ipratropium-albuterol, LORazepam    Objective:    Vital signs in last 24 hours:  Temp:  [97.5 ??F (36.4 ??C)-98.8 ??F (37.1 ??C)] 98 ??F (36.7 ??C)  Heart Rate:  [87-196] 196  Resp:  [19-24] 24  BP: (99-117)/(65-75) 99/65 mmHg    Physical  Examination:    GENERAL: The patient is awake,  ,not in acute cardiorespiratory distress. Looking chronically sick.   HEENT:Atraumatic.Normocephalic. PERRLA, EOMI. No icterus, no pallor, no jugular venous distention..No ear or nose discharge.  NECK: Supple. Trachea is in midline. No Thyromegaly  CHEST: Symmetric. Clear to auscultation, bilateral equal air entry. No wheezing or rhonchi.  CARDIOVASCULAR: S1, S2, Regular pulse. No murmur or gallops. No JVD.  ABDOMEN: Whole anterior abdomen is covered with pouch  EXTREMITIES: There is no edema . Right foot has dressing in place.   Skin;No bruise,No rash.No petechiae.  CNS;  No focal neurological deficit.        Intake/Output last 3 shifts:       Recent Labs;                           Lab name 09/05/15  0643 09/09/15  0413 09/12/15  0526   WBC 8.7 6.2 9.7   HEMOGLOBIN 7.9* 7.1* 7.8*   HEMATOCRIT 25.0* 23.0* 24.8*   MEAN CORPUSCULAR VOLUME 89.1 90.0 87.3   PLATELETS 534* 521* 642*  Lab name 09/05/15  0643 09/09/15  0413 09/12/15  0526   SODIUM 136 138 134   POTASSIUM 4.7 4.2 4.7   CREATININE 0.59* 0.58* 0.63   BUN 33* 32* 38*   CHLORIDE 102 105 98   CO2 28 26 28    PHOSPHORUS 3.9 3.9 3.6   MAGNESIUM 2.1 2.0 2.0                         Lab name 08/19/15  0520 08/26/15  0521 09/02/15  0359 09/09/15  0413   AST 23 23 14 14    ALT 26 17 17 16    BILIRUBIN TOTAL 0.3 0.3 0.2 0.2       Lab name 07/05/15  0702  07/09/15  0544  09/05/15  0643 09/09/15  0413 09/12/15  0526   PREALBUMIN 14.0*  --  16.0*  --   --   --   --    ALBUMIN  --   < > 1.5*  < > <1.5* <1.5*   <1.5* <1.5*   < > = values in this interval not displayed.                                                                                                   Lab name 09/09/15  0413 09/12/15  0526   CALCIUM 8.0* 8.5*   PHOSPHORUS 3.9 3.6                          Lab name 09/02/15  0359 09/09/15  0413   TRIGLYCERIDES 89 72                        Lab name 08/05/15  0900  08/07/15  0850 08/10/15  0830   VANCOMYCIN TROUGH 9.3* 9.1* 17.4       CT abdomen 3/24  1. Multiple abscess, large pelvic abscess with surgical drain,  right issue rectal fossa.  2. 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  3. Granulation tissue enhancement or blood extravasation in the  lower abdominal wall as described  4. Proximity of the small bowel vasculature at the level of the  open wound on the right, side of active bleeding  5. Lower lobe bronchopneumonia and aspiration is also considered    Diet;    Diet Orders          TPN ADULT ([facility]) at 83.3 mL/hr starting at 06/01 1800    TPN ADULT ([facility]) at 75 mL/hr starting at 05/31 1800    Diet NPO effective now Except for: except ice chips, except sips of clears starting at 03/28 0959            Assessment/Plan:      1. Peritoneal abscess- Mixed flora, pigtail drain 06/26/15 . Vanco/Zosyn/Diflucan.           -Completed  6 week Abx on 08/12/15.      2. Enterocutaneous Fistula- wound care, bowel rest. Still has significant output.  3. Severe intractable acute on chronic pain syndrome- receiving Dilaudid IV every 3 hr  Prn,Fentanyl patch decreased to 25  from 50  mcg. Started PO Oxycodone scheduled as she had better response with it previously. Added Gabapentin as she was on previously.         Will gradually decrease the IV dilaudid dose. Decreased to 1 mg IV Q 4 hr  on 5/31 . Referred  to pain management.  Clinic. Increased       Oxycodone to 20 mg po Q4hr on 5/30.  . Will try to decrease IV Dilaudid to 1 mg IV Q4 hr tomorrow.         For depression, Remeron was added on 5/24. Will monitor her behavior. Pt is non compliant with most of the treatment offered to her.     4. Thrombocytosis. uncertain etiology- follow closely. Likely reactive.     5. Cachexia-   continue TPN, recheck PAB. Albumin stays less than 1.5.    6. Sinus tachycardia. Likely related to pain. Monitor.Contine  Metoprolol with  holding parameter. Pt refusing to take it.     7. Ischemic feet/arterial thromboemboli- The patient had a clot in her femoral artery right leg that was cleared by vascular surgery.  However the patient ended up with necrosis at her distal digits which may be a combination of lack of blood flow and the use of pressors. Refusing heparin, aspirin and Plavix. Refused all heparin injections previously as well. Canceled aspirin but continue to encourage Plavix..           Following podiatrist at [facility]. Getting better.     8. History of colon cancer s/p exenteration with bilateral nephrostomy tubes    9. Recurrent presacral abscesses. Completed the course of Abx.    10. Debility- PT/OT. Pt is not participating most of the times.   11. Dysphagia. Feel choking sensation on medication. SLP following. Pt with min oral dysphagia 2/2 anterior spillage with saliva, but none with PO. Pt was able to use a straw without difficulty. ??    On TPN. 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 Can not tolerate PO. Weekly LFT while on TPN. TPN being managed by pharmacy. Electrolytes replacement through TPN. LFT are WNL so far. Fistula are not healing well. Not a candidate for enteric feeding or PEG placement because everything is coming out.   Will need TPN for at least 3-6 month depending the fistula healing.     12. D/C planning . Family meeting held on  5/24. [name]  Pt need extensive wound care, TPN, IV Pain meds which are hurdle to discharge.        Goal is set to d/c her with home care in first week of June.     13. Nausea.  Zofran.     14.  New JP drain was placed on 5/16. F/u IR for drain management as OP. Appointment with Dr. Rushie Chestnut on 6/6.   15. Pt refusing to let us remove the [facility] catheter which is there now for more than one month.     A/P reviewed and updated.  Increased PO Oxycodone to 20 mg q 4 hr. decreased IV Dilaudid to 1 mg q 4 hr PRN break through pain.    Pt is not taking her Plavix, Mirtazapine, Gabapentin, Metoprolol. Refusing to take Lovenox  for DVT prevention. MAR reviewed.   Pt states that these are too many medication to take and she is not eating. It hurts her stomach.  Recent labs reviewed.    Prophylaxis;  GI Prophylaxis: Pepcid  DVT Prophylaxis: Lovenox     Code Status: Full Code      Eliya Geiman MD.       09/12/2015

## 2015-09-13 ENCOUNTER — Emergency Department: Primary: Internal Medicine

## 2015-09-13 ENCOUNTER — Inpatient Hospital Stay: Admit: 2015-09-13 | Discharge: 2015-10-12 | Disposition: E | Payer: MEDICARE | Attending: Emergency Medicine

## 2015-09-13 ENCOUNTER — Emergency Department: Admit: 2015-09-13 | Primary: Internal Medicine

## 2015-09-13 DIAGNOSIS — R58 Hemorrhage, not elsewhere classified: Secondary | ICD-10-CM

## 2015-09-13 LAB — POCT PH: POC pH, Arterial: 7.25 (ref 7.35–7.45)

## 2015-09-13 LAB — POCT CALCIUM, TOTAL: POC Ionized Calcium: 5 mg/dL (ref 4.5–5.3)

## 2015-09-13 LAB — POC PO2: POC pO2, Arterial: 368 mm Hg (ref 80–100)

## 2015-09-13 LAB — POCT GLUCOSE
POC Glucose, Arterial: 181 mg/dL (ref 65–99)
POC Glucose: 320 mg/dl — ABNORMAL HIGH (ref 70–99)

## 2015-09-13 LAB — POC BASE EXCESS: POC Base Excess, Arterial: -18 (ref <=3.0)

## 2015-09-13 LAB — POC O2 SAT: POC O2 Saturation, Arterial: 100 % (ref 95–98)

## 2015-09-13 LAB — POC HCO3: POC HCO3, Arterial: 7.8 mmol/L (ref 22.0–26.0)

## 2015-09-13 LAB — POC SODIUM: POC Sodium: 131 mmol/L (ref 135–146)

## 2015-09-13 LAB — HEMOGLOBIN, DRAKE STAT: Hemoglobin: 4.2 g/dL (ref 11.7–15.5)

## 2015-09-13 LAB — POC TCO2: POC TCO2, Arterial: 8 mmol/L (ref 23–27)

## 2015-09-13 LAB — POC PCO2: POC pCO2, Arterial: 18 mm Hg (ref 35–45)

## 2015-09-13 LAB — POC POTASSIUM: POC Potassium: 5.9 mmol/L (ref 3.5–5.3)

## 2015-09-13 LAB — PREPARE RBC (CROSSMATCH)
Dispense Status Blood Bank: TRANSFUSED
Dispense Status Blood Bank: TRANSFUSED
Dispense Status Blood Bank: TRANSFUSED

## 2015-09-13 LAB — EKG 12-LEAD
Atrial Rate: 133 {beats}/min
P Axis: 70 degrees
P-R Interval: 118 ms
Q-T Interval: 276 ms
QRS Duration: 66 ms
QTc Calculation (Bazett): 410 ms
R Axis: 66 degrees
T Axis: 58 degrees
Ventricular Rate: 133 {beats}/min

## 2015-09-13 LAB — PREPARE PLASMA
Dispense Status Blood Bank: TRANSFUSED
Dispense Status Blood Bank: TRANSFUSED

## 2015-09-13 LAB — BLOOD GAS, VENOUS
Base Excess, Ven: -22.8 mmol/L — ABNORMAL LOW (ref <=3.0)
Carboxyhemoglobin: 4.6 % — ABNORMAL HIGH (ref 0.0–1.5)
HCO3, Venous: 7 mmol/L — ABNORMAL LOW (ref 23.0–29.0)
MetHgb, Ven: 0.8 % (ref <1.5)
O2 Content, Ven: 10 VOL %
O2 Sat, Ven: 99 %
TC02 (Calc), Ven: 8 mmol/L
pCO2, Ven: 30.8 mmHg — ABNORMAL LOW (ref 40.0–50.0)
pH, Ven: 6.98 — CL (ref 7.35–7.45)
pO2, Ven: 198.9 mmHg — ABNORMAL HIGH (ref 25.0–40.0)

## 2015-09-13 LAB — ABO/RH: ABO/Rh: B POS

## 2015-09-13 LAB — PROTIME-INR
INR: 1.91 — ABNORMAL HIGH (ref 0.85–1.15)
Protime: 21.6 s — ABNORMAL HIGH (ref 9.6–13.0)

## 2015-09-13 LAB — CBC WITH AUTO DIFFERENTIAL
Bands Relative: 21 % — ABNORMAL HIGH (ref 0–7)
Basophils %: 0 %
Basophils Absolute: 0 10*3/uL (ref 0.0–0.2)
Eosinophils %: 0 %
Eosinophils Absolute: 0 10*3/uL (ref 0.0–0.6)
Hematocrit: 23.1 % — ABNORMAL LOW (ref 36.0–48.0)
Hemoglobin: 6.7 g/dL — CL (ref 12.0–16.0)
Lymphocytes %: 29 %
Lymphocytes Absolute: 10.2 10*3/uL — ABNORMAL HIGH (ref 1.0–5.1)
MCH: 28.8 pg (ref 26.0–34.0)
MCHC: 29 g/dL — ABNORMAL LOW (ref 31.0–36.0)
MCV: 99.3 fL (ref 80.0–100.0)
MPV: 8.3 fL (ref 5.0–10.5)
Metamyelocytes Relative: 9 % — AB
Monocytes %: 2 %
Monocytes Absolute: 0.7 10*3/uL (ref 0.0–1.3)
Myelocyte Percent: 5 % — AB
Neutrophils %: 34 %
Neutrophils Absolute: 24.3 10*3/uL — ABNORMAL HIGH (ref 1.7–7.7)
Platelets: 287 10*3/uL (ref 135–450)
RBC: 2.33 M/uL — ABNORMAL LOW (ref 4.00–5.20)
RDW: 15.3 % (ref 12.4–15.4)
WBC: 35.2 10*3/uL — ABNORMAL HIGH (ref 4.0–11.0)
nRBC: 4 /100 WBC — AB

## 2015-09-13 LAB — SPECIMEN REJECTION

## 2015-09-13 LAB — ANTIBODY SCREEN: Antibody Screen: NEGATIVE

## 2015-09-13 LAB — PREPARE PLATELETS: Dispense Status Blood Bank: TRANSFUSED

## 2015-09-13 LAB — APTT: aPTT: 70 s — ABNORMAL HIGH (ref 21.0–31.8)

## 2015-09-13 MED ORDER — EPINEPHRINE HCL 1 MG/ML IJ SOLN
1 mg/mL | INTRAMUSCULAR | Status: DC
Start: 2015-09-13 — End: 2015-09-13
  Administered 2015-09-13: 15:00:00 1 ug/min via INTRAVENOUS

## 2015-09-13 MED ORDER — SODIUM BICARBONATE 8.4 % IV SOLN
8.4 % | INTRAVENOUS | Status: DC
Start: 2015-09-13 — End: 2015-09-13

## 2015-09-13 MED ORDER — FENTANYL CITRATE (PF) 100 MCG/2ML IJ SOLN
100 MCG/2ML | INTRAMUSCULAR | Status: DC
Start: 2015-09-13 — End: 2015-09-13

## 2015-09-13 MED ORDER — NOREPINEPHRINE 16 MG IN DEXTROSE 5% 250 ML INFUSION
16 mg | INTRAVENOUS | Status: DC
Start: 2015-09-13 — End: 2015-09-13

## 2015-09-13 MED ORDER — SODIUM CHLORIDE 0.9% INTERMITTENT INFUSION
0.9 % | Freq: Once | INTRAVENOUS | Status: DC
Start: 2015-09-13 — End: 2015-09-13

## 2015-09-13 MED ORDER — MORPHINE SULFATE (PF) 10 MG/ML IV SOLN
10 MG/ML | Freq: Once | INTRAVENOUS | Status: DC
Start: 2015-09-13 — End: 2015-09-13

## 2015-09-13 MED ORDER — SODIUM CHLORIDE 0.9 % IV SOLN
0.9 % | INTRAVENOUS | Status: DC
Start: 2015-09-13 — End: 2015-09-13
  Administered 2015-09-13: 14:00:00 1 ug/kg/h via INTRAVENOUS

## 2015-09-13 MED ORDER — CALCIUM CHLORIDE 10 % IV SOLN
10 % | Freq: Every day | INTRAVENOUS | Status: AC | PRN
Start: 2015-09-13 — End: 2015-09-13
  Administered 2015-09-13: 13:00:00 1 via INTRAVENOUS

## 2015-09-13 MED ORDER — LORAZEPAM 2 MG/ML IJ SOLN
2 MG/ML | Freq: Once | INTRAMUSCULAR | Status: DC
Start: 2015-09-13 — End: 2015-09-13

## 2015-09-13 MED ORDER — SODIUM CHLORIDE 0.9 % IV BOLUS
0.9 % | Freq: Once | INTRAVENOUS | Status: DC
Start: 2015-09-13 — End: 2015-09-13

## 2015-09-13 MED ORDER — MIDAZOLAM HCL 2 MG/2ML IJ SOLN
2 MG/ML | INTRAMUSCULAR | Status: DC
Start: 2015-09-13 — End: 2015-09-13

## 2015-09-13 MED ORDER — LORAZEPAM 2 MG/ML IJ SOLN
2 MG/ML | INTRAMUSCULAR | Status: DC
Start: 2015-09-13 — End: 2015-09-13

## 2015-09-13 MED ORDER — EPINEPHRINE HCL 0.1 MG/ML IJ SOSY
0.1 mg/mL | Freq: Every day | INTRAMUSCULAR | Status: AC | PRN
Start: 2015-09-13 — End: 2015-09-13
  Administered 2015-09-13 (×3): 1 via INTRAVENOUS

## 2015-09-13 MED ORDER — SODIUM BICARBONATE 8.4 % IV SOLN
8.4 % | Freq: Every day | INTRAVENOUS | Status: AC | PRN
Start: 2015-09-13 — End: 2015-09-13
  Administered 2015-09-13 (×2): 50 via INTRAVENOUS

## 2015-09-13 MED ORDER — TPN ADULT (WCH/Drake)
500 | INTRAMUSCULAR | Status: AC
Start: 2015-09-13 — End: 2015-09-13

## 2015-09-13 MED ORDER — dextrose 5 % and 0.45 % NaCl infusion 1000 mL
INTRAVENOUS | Status: AC
Start: 2015-09-13 — End: 2015-09-13

## 2015-09-13 MED ORDER — sodium chloride 0.9 % infusion 1,000 mL
Freq: Once | INTRAVENOUS | Status: AC
Start: 2015-09-13 — End: 2015-09-13

## 2015-09-13 MED ORDER — LORazepam (ATIVAN) 2 mg/mL injection
2 | INTRAMUSCULAR | Status: AC
Start: 2015-09-13 — End: 2015-09-13

## 2015-09-13 MED ORDER — LORazepam (ATIVAN) 2 mg/mL injection
2 | Freq: Once | INTRAMUSCULAR | Status: AC
Start: 2015-09-13 — End: 2015-09-13
  Administered 2015-09-13: 12:00:00 0.5 mg via INTRAVENOUS

## 2015-09-13 MED FILL — TRAVASOL 10 % INTRAVENOUS SOLUTION: 10 10 % | INTRAVENOUS | Qty: 860

## 2015-09-13 MED FILL — OXYCODONE 20 MG/ML ORAL CONCENTRATE: 20 20 mg/mL | ORAL | Qty: 1

## 2015-09-13 MED FILL — HYDROMORPHONE 1 MG/ML INJECTION SYRINGE: 1 1 mg/mL | INTRAMUSCULAR | Qty: 1

## 2015-09-13 MED FILL — LORAZEPAM 2 MG/ML INJECTION SOLUTION: 2 2 mg/mL | INTRAMUSCULAR | Qty: 1

## 2015-09-13 MED FILL — ONDANSETRON HCL (PF) 4 MG/2 ML INJECTION SOLUTION: 4 4 mg/2 mL | INTRAMUSCULAR | Qty: 2

## 2015-09-13 MED FILL — FENTANYL CITRATE (PF) 100 MCG/2ML IJ SOLN: 100 MCG/2ML | INTRAMUSCULAR | Qty: 2

## 2015-09-13 MED FILL — LORAZEPAM 2 MG/ML IJ SOLN: 2 MG/ML | INTRAMUSCULAR | Qty: 2

## 2015-09-13 MED FILL — DEXTROSE 5 % IV SOLN: 5 % | INTRAVENOUS | Qty: 1000

## 2015-09-13 MED FILL — MORPHINE SULFATE (PF) 10 MG/ML IV SOLN: 10 MG/ML | INTRAVENOUS | Qty: 1

## 2015-09-13 MED FILL — MIDAZOLAM HCL 2 MG/2ML IJ SOLN: 2 MG/ML | INTRAMUSCULAR | Qty: 2

## 2015-09-13 MED FILL — NOREPINEPHRINE 16 MG IN DEXTROSE 5% 250 ML INFUSION: 16 mg | INTRAVENOUS | Qty: 250

## 2015-09-13 MED FILL — EPINEPHRINE HCL 1 MG/ML IJ SOLN: 1 mg/mL | INTRAMUSCULAR | Qty: 5

## 2015-09-13 MED FILL — FENTANYL CITRATE (PF) 1000 MCG/20ML IJ SOLN: 1000 MCG/20ML | INTRAMUSCULAR | Qty: 20

## 2015-09-13 NOTE — Unmapped (Signed)
Late Addendum- notified  10 minutes prior to physician change that there was some blood from the patient's fistula but vital signs stable and patient very alert and her primary concern was receiving pain medications. Requested and received assurance that repeat CBC would be performed this morning, and to notify oncoming physician if status changed. It appears that patient began bleeding much more briskly subsequently and oncoming physician notified of further status change, and responded immediately.

## 2015-09-13 NOTE — Unmapped (Signed)
Alert and oriented. Complaint of pain gave prn dilaudid and scheduled oxycodone. Continue to ask again and again for more pain meds. Blood clots noted from fistula. Notified Dr Lovey Newcomer. No new orders. Patient continue to refuse care and to have wounds redressed. Vital stable through night.

## 2015-09-13 NOTE — Unmapped (Signed)
Clinical Pharmacy Service - TPN Progress Note    Linda Ponce is a 45 y.o. female followed by the pharmacy department for monitoring and adjustment of TPN    Assessment/Plan:   1) Continue continuous TPN with electrolytes, macronutrients and additives from yesterday  2) New macro and total volume adjustments listed in chart below:  3) Routine labs Monday and Thursday  4) Pharmacy will continue to monitor electrolytes and make necessary adjustments.       NEW TPN Macronutrients 6/1   Volume:   Rate: 83.3 mL/hr   Amino Acids: 43  g/L (86gm per bag)   Dextrose: 147 g/L (294gm/bag)   Lipids: 18 g/L (36gm/bag) TPN Additives   MVI 10 mL   TE 1 mL         Thank you for the consult,  Tillie Fantasia, PHARMD   09/13/2015 7:36 AM

## 2015-09-13 NOTE — Unmapped (Signed)
Inpatient Discharge Summary    Patient: Linda Ponce  Age: 45 y.o.  MRN: [id number]  CSN: [id number]  Date of Admission: 07/08/2015  Date of Discharge: 09/17/2015  Attending Physician: No att. providers found  Primary Care Physician: Attending Provider Unknown     Diagnoses Present on Admission     Acute GI bleeding.  Acute on chronic lood loss anemia.  Acute respiratory failure.  DIC suspected due to bleeding from multiple sites and chronic ischemic foot.    Discharge Diagnosis     Active Hospital Problems    Adjustment disorder with mixed anxiety and depressed mood      Pain of multiple sites      Cognitive dysfunction-recent onset.      Enterocutaneous fistula      Malnutrition      *Fistula      Abdominal abscess        Other Procedures / Pertinent Imaging:  X-ray Portable Chest   Final Result   IMPRESSION:       Right IJ approach central venous catheter tip overlies the right atrium. No visible pneumothorax.      Hyperlucency in the left abdominal quadrant, not well characterized.      I have personally reviewed the images and I agree with this report.      Report Verified by: Baird Cancer, [name] at 07/08/2015 7:46 PM EDT            Consulting Services (include reason)      IP CONSULT TO NUTRITION SERVICES  IP PHARMACY REQUESTS  INPATIENT CONSULT TO PHARMACY FOR MEDICATION MANAGEMENT   IP CONSULT TO INFECTIOUS DISEASES  IP CONSULT TO INFECTIOUS DISEASES  IP CONSULT TO PSYCHOLOGY  IP CONSULT TO PSYCHIATRY    Allergies      Allergies   Allergen Reactions   ??? Compazine [Prochlorperazine Edisylate]    ??? Latex, Natural Rubber        Discharge Medications         Medication List      Notice     You have not been prescribed any medications.           Discharge Exam    Results:                       Lab name 09/05/15  0643 09/09/15  0413 09/12/15  0526 09/13/15  0805   WBC 8.7 6.2 9.7  --    HEMOGLOBIN 7.9* 7.1* 7.8* 4.2*   HEMATOCRIT 25.0* 23.0* 24.8*  --    MEAN CORPUSCULAR VOLUME 89.1 90.0 87.3  --    PLATELETS 534* 521*  642*  --                                                           Lab name 09/05/15  0643 09/09/15  0413 09/12/15  0526   SODIUM 136 138 134   POTASSIUM 4.7 4.2 4.7   CREATININE 0.59* 0.58* 0.63   BUN 33* 32* 38*   CHLORIDE 102 105 98   CO2 28 26 28    PHOSPHORUS 3.9 3.9 3.6   MAGNESIUM 2.1 2.0 2.0  Lab name 08/19/15  0520 08/26/15  0521 09/02/15  0359 09/09/15  0413   AST 23 23 14 14    ALT 26 17 17 16    BILIRUBIN TOTAL 0.3 0.3 0.2 0.2                          Lab name 08/04/15  1728   CRP 96.9*                          Lab name 08/04/15  1728   SED RATE 15                          Lab name 07/05/15  0702  07/09/15  0544  09/05/15  0643 09/09/15  0413 09/12/15  0526   PREALBUMIN 14.0*  --  16.0*  --   --   --   --    ALBUMIN  --   < > 1.5*  < > <1.5* <1.5*   <1.5* <1.5*   < > = values in this interval not displayed.                                                                                                 Lab name 09/09/15  0413 09/12/15  0526   CALCIUM 8.0* 8.5*   PHOSPHORUS 3.9 3.6                            Lab name 09/02/15  0359 09/09/15  0413   TRIGLYCERIDES 89 72                          Lab name 08/05/15  0900 08/07/15  0850 08/10/15  0830   VANCOMYCIN TROUGH 9.3* 9.1* 17.4                                                   Last POC Glucose:      Component Value Date/Time    POCGMD 125* 09/12/2015 0758    POCGMD 100 09/11/2015 0535    POCGMD 109* 09/10/2015 0628    POCGMD 115* 09/08/2015 0937    POCGMD 109* 09/07/2015 0803    POCGLU 181* 09/13/2015 0805     Last INR:  No results found for: INR, PROTIME  Last Prealbumin:  Lab Results   Component Value Date    PREALBUMIN 16.0* 07/09/2015    PREALBUMIN 14.0* 07/05/2015     O2 was down to 75%, b/p 103/42, HR 120    GENERAL: The patient is awake, pale, anxious,  Looking chronically sick.   HEENT:Atraumatic.Normocephalic. PERRLA, EOMI.   NECK: Supple. Trachea is in midline. No Thyromegaly  CHEST: Symmetric. Clear to auscultation,  bilateral equal air entry. No wheezing or rhonchi.  CARDIOVASCULAR: S1, S2, Regular pulse. No murmur  or gallops. No JVD.  ABDOMEN: multiple abdominal fistulas with bright red blood and clots in [place] pouch, colostomy bag,  and drainage bag, JP drain as well Foley  EXTREMITIES:  Right foot necrotic wound  CNS;?? No focal neurological deficit. 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 Presented to [facility] with abdominal distention and fevers.?? Identified as having pelvic and intraabdominal abscess - drained by IR?? - mixed enteric flora with no predominance.?? Readmitted to [facility] with bleeding for wound.?? Course complicated by ischemia to right foot..??     Hospital Course        1. Peritoneal abscess- Mixed flora, pigtail drain 06/26/15 . Vanco/Zosyn/Diflucan.  ?????????????? -Completed?? 6 week Abx.   ????  2. Enterocutaneous Fistula- wound care, bowel rest. Still has significant output.  ????  3. Severe intractable acute on chronic pain syndrome- receiving Dilaudid IV every 3 hr?? Prn,Fentanyl patch decreased to 25?? from 50?? mcg. Started PO Oxycodone scheduled as she had better response with it previously. Added Gabapentin as she was on previously.  ?????????? Will gradually decrease the IV dilaudid dose. Decreased to 1 mg IV Q 4 hr?? on 5/31 . Referred?? to pain management.?? Clinic. Increased???????????? Oxycodone to 20 mg po Q4hr on 5/30.?? . Will try to decrease IV Dilaudid to 1 mg IV Q4 hr tomorrow. ??  ?????????? For depression, Remeron was added on 5/24. Will monitor her behavior. Pt is non compliant with most of the treatment offered to her. ??  ????  4. Thrombocytosis. uncertain etiology- follow closely. Likely reactive.  ????  5. Cachexia-???? continue TPN, recheck PAB. Albumin stays less than 1.5.  ????  6. Sinus tachycardia. Likely related to pain. Monitor.Contine?? Metoprolol with holding parameter. Pt refusing to take  it.  ????  7. Ischemic feet/arterial thromboemboli- The patient had a clot in her femoral artery right leg that was cleared by vascular surgery.?? However the patient ended up with necrosis at her distal digits which may be a combination of lack of blood flow and the use of pressors  8. Code Blue was called around 8:00 am, please see Code  documentation. Upon assessment, patient found to be bleeding from abdominal fistulas as well as foley catheters. Hb 7.8->4.2. Patient suspected to have acute DIC- BP stable, fluid bolus and IV maintenance fluids ongoing, O2 sat came up with NC 3L. Patient remains FULL CODE, transferred to [facility] ED per her request.        Condition on Discharge      Functional Status: severely impaired  Describe limitations, if any: cachexia, wasting, generalized weakness    Mental Status: Alert/Oriented  Describe limitations, if any: none    Discharge specific orders:     Code Status: Full Code    Diet:        Diet Orders          None          Disposition      ED    Follow-Up Appointments    Future Appointments:  Future Appointments  Date Time Provider Department Center   09/24/2015 1:00 PM Chapman Fitch, MD [facility]             Signed:   Fredrich Birks, MD  09/17/2015  9:48 AM

## 2015-09-13 NOTE — Unmapped (Signed)
RT assisted in CODE BLUE/ Rapid Response SEE NOTE

## 2015-09-13 NOTE — Unmapped (Signed)
Received report from night shift nurse that pt was passing blood clots from abdominal fistulas, that the MD was aware, and that her vitals had been stable. No vitals were charted in EPIC since 8 pm on 6/1, so this nurse went back to get a set of vitals on patients. Upon assessment, patient found to be bleeding from abdominal fistulas as well as foley catheters. Pt was hypercapnic and pale. O2 was down to 75%, b/p 103/42, HR 120. O2 given via non re breather mask and rapid response called. Dr. Judieth Keens responded with orders to begin NS bolus, STAT labs and an acute out for the bleeding. Mobile care arrived about 15 minutes later to transport patient to [facility]. Sister was notified and voiced understanding.

## 2015-09-13 NOTE — ED Notes (Signed)
Sister at bedside with Chaplain, pt moaning comfort care provided.       Ellis Savage, RN  09/24/2015 [phone]

## 2015-09-13 NOTE — ED Notes (Signed)
Per sister pt has been in [place] for rehab she was preparing to take the pt home on 09/17/15.  Pt has been refusing care at [facility], per sister stating she is tired.   Pt is a full code, per sister her mother in [place] is her next of kin.  Mother has been notified and is making arrangements to come to [place].  Per sister in March pt was in a hospital in [facility] after surgery for colorectal cancer, per sister she was discharged in not good condition so she put her in a rental car and drove her to [place], she let her stay at her home a couple of days then when it appeared she was going to die she called 911 and had her brought here.  Pt is apparently well known to Dr Rodney Booze and Dr Roanna Raider in the ICU.       Larna Daughters Ahlani Wickes, RN  09/16/2015 1011

## 2015-09-13 NOTE — ED Notes (Signed)
I called the [facility] spoke with Lanette Hampshire, notified of pt's passing, Lanette Hampshire will speak with attending at [facility] to see if she is willing to sign the death certificate.       Ellis Savage, RN  10/05/2015 1314

## 2015-09-13 NOTE — Other (Signed)
Chaplain responded to code blue in ED and stayed with patient and her sister until death.  Patient's mother and teenage son live in [place] and cannot make it here.  Patient's sister is tearful and grieving but relying on her strong faith in 67.  Chaplain provided comfort, prayer, scripture for patient and her family before and after death.

## 2015-09-13 NOTE — ED Notes (Signed)
Coroner paged, chaplain remains at bedside.       Ellis Savage, RN  09/24/2015 [phone]

## 2015-09-13 NOTE — ED Notes (Signed)
Pt placed in body bag with identification.  Sister has left, [facility] notified that sister has left.  [facility] continues to have a hold on the pt.       Ellis Savage, RN  10/01/2015 1343

## 2015-09-13 NOTE — ED Notes (Signed)
Pt moving left arm.       Ellis Savage, RN  09/16/2015 [phone]

## 2015-09-13 NOTE — ED Notes (Signed)
Dr Suzan Garibaldi at [facility] has agreed to sign death certificate.     Ellis Savage, RN  09/14/2015 1325

## 2015-09-13 NOTE — ED Notes (Signed)
Pt moving both arms.  First unit of blood infused at this time.        Ellis Savage, RN  10/05/2015 [phone]

## 2015-09-13 NOTE — ED Notes (Signed)
Dr Roanna Raider not able to obtain ABG with [place].  Pt continues to bleed from all wounds, around central line.  At this time decision made to bring ER attending, Dr Roanna Raider and sister to bedside for plan of care.  Chaplain present.       Ellis Savage, RN  09/16/2015 [phone]

## 2015-09-13 NOTE — ED Notes (Signed)
Pt extubated.       Ellis Savage, RN  09/29/2015 [phone]

## 2015-09-13 NOTE — ED Provider Notes (Signed)
[facility]      Pt Name: Erica Harding  MRN: [id number]  [place] 1970-10-04  Date of evaluation: 10/02/2015  Provider: Uvaldo Rising, MD  CHIEF COMPLAINT  Chief Complaint   Patient presents with   ??? Cardiac Arrest     Pt arrived via [facility] from the [facility] for low H/H, pt became pulseless upon arrival.       HPI  Erica Harding is a 45 y.o. female who presents because of low hemoglobin level and bleeding.  She has multiple medical problems including recent admission for intra-abdominal abscesses.  She has multiple abdominal wounds and has been at a nursing home since release.  She is too ill to provide any history.  Medics tell me that her blood pressure was low on the scene and her hemoglobin was low this morning and her pH was low at 7.2 on morning labs.  According to the patient's sister, the patient has been refusing care.  She has been ill for a prolonged period of time and has a history of colorectal cancer.    REVIEW OF SYSTEMS:  See HPI for further details. Remainder of review of systems limited by patient ability to provide history.  Nursing notes reviewed.    PAST MEDICAL HISTORY  Past Medical History:   Diagnosis Date   ??? AKI (acute kidney injury) ([place])    ??? Colorectal cancer ([place])    ??? Encephalopathies    ??? Fistula    ??? 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 Peritonitis ([place])      SURGICAL HISTORY  Past Surgical History:   Procedure Laterality Date   ??? ABDOMEN SURGERY     ??? COLONOSCOPY     ??? HYSTERECTOMY       MEDICATIONS:  No current facility-administered medications on file prior to encounter.      Current Outpatient Prescriptions on File Prior to Encounter   Medication Sig Dispense Refill   ??? fentaNYL (DURAGESIC) 50 MCG/HR Place 1 patch onto the skin every 72 hours .     ??? gabapentin (NEURONTIN) 100 MG capsule Take 100 mg by mouth 3 times daily     ??? oxyCODONE (ROXICODONE INTENSOL) 100 MG/5ML concentrated solution Take 15 mg by mouth every 4 hours as needed for  Pain .     ??? vancomycin (VANCOCIN) 500 MG/100ML IVPB Infuse 150 mLs intravenously every 24 hours 2500 mL 0   ??? HYDROmorphone (DILAUDID) 1 MG/ML injection Infuse 1 mL intravenously every 2 hours as needed for Pain . (Patient taking differently: Infuse 1 mg intravenously every 3 hours  .) 1 mL 0   ??? acetaminophen (OFIRMEV) 10 MG/ML SOLN infusion Infuse 74.1 mLs intravenously every 6 hours as needed for Fever or Pain 4000 mL 0   ??? aspirin 81 MG chewable tablet 1 tablet by Per NG tube route daily 30 tablet 3   ??? LORazepam (ATIVAN) 2 MG/ML injection Infuse 0.5 mLs intravenously every 4 hours as needed (anxiety) 1 mL 0   ??? ipratropium-albuterol (DUONEB) 0.5-2.5 (3) MG/3ML SOLN nebulizer solution Inhale 3 mLs into the lungs every 4 hours as needed for Shortness of Breath 360 mL 0   ??? glucose (GLUTOSE) 40 % GEL Take 15 g by mouth as needed (glc<70) 45 g 1   ??? insulin lispro (HUMALOG) 100 UNIT/ML pen Inject 0-6 Units into the skin every 4 hours 5 Pen 3   ??? ondansetron (ZOFRAN) 4 MG/2ML injection Infuse  2 mLs intravenously every 6 hours as needed for Nausea 15 mL 0   ??? fluconazole (DIFLUCAN) 200MG /100ML IVPB Infuse 100 mLs intravenously every 24 hours 100 mL 0   ??? magnesium sulfate 10-5 MG/ML-% Infuse 100 mLs intravenously as needed (Per IV Magnesium Replacement Protocol) 100 mL 0   ??? potassium chloride 20 MEQ/50ML IVPB Infuse 50 mLs intravenously as needed (Per IV Potassium Replacement Protocol) 50 mL 0   ??? clopidogrel (PLAVIX) 75 MG tablet 1 tablet by Per NG tube route daily 30 tablet 3   ??? fat emulsion 20 % infusion Infuse 250 mLs intravenously twice a week 250 mL 0   ??? famotidine (PEPCID) 20 MG/2ML injection Infuse 2 mLs intravenously daily 2 mL 0     ALLERGIES  Latex and Compazine [prochlorperazine maleate]  FAMILY HISTORY  History reviewed. No pertinent family history.  SOCIAL HISTORY:  Social History   Substance Use Topics   ??? Smoking status: Never Smoker   ??? Smokeless tobacco: None   ??? Alcohol use No      IMMUNIZATIONS:  Noncontributory    PHYSICAL EXAM  VITAL SIGNS:  Blood pressure (!) 59/15, pulse 104, height 5' 2.21" (1.58 m), weight 89 lb (40.4 kg), SpO2 100 %.  Constitutional:  45 y.o. female unresponsive, pale, chronically ill and cachectic  HENT:  Atraumatic, mucous membranes moist  Eyes:   Conjunctiva clear, no icterus  Neck:  Supple, no JVD, no signs of injury  Cardiovascular:  Regular, no rubs, no discernible murmur  Thorax & Lungs:  No accessory muscle usage, clear, right central line clavicle area  Abdomen:  Soft, non distended, there is an open abdominal wound with a bag over it and blood in the bag, there is blood coming from her ostomy site, there is blood in her nephrostomy tube  Back:  No deformity  Genitalia:  Externally normal  Rectal:  Deferred  Extremities:  Cyanosis, no edema  Skin:  Warm, dry, pale, cyanotic  Neurologic:  Unresponsive, pupils are symmetric and unresponsive, initially no spontaneous movements  Psychiatric:  Unassessable    DIAGNOSTIC RESULTS:  Labs resulted at the time of this note reviewed.  Labs Reviewed   CBC WITH AUTO DIFFERENTIAL - Abnormal; Notable for the following:        Result Value    WBC 35.2 (*)     RBC 2.33 (*)     Hemoglobin 6.7 (*)     Hematocrit 23.1 (*)     MCHC 29.0 (*)     Neutrophils # 24.3 (*)     Lymphocytes # 10.2 (*)     Bands Relative 21 (*)     Metamyelocytes Relative 9 (*)     Myelocytes Relative 5 (*)     nRBC 4 (*)     Dohle Bodies Present (*)     All other components within normal limits    Narrative:     CALL  Ward  SFERF tel. [id number],  Hematology results called to and read back by lori jones, rn, 09/15/2015  10:19, by Rebecca Eaton   BLOOD GAS, VENOUS - Abnormal; Notable for the following:     pH, Ven 6.98 (*)     pCO2, Ven 30.8 (*)     pO2, Ven 198.9 (*)     HCO3, Venous 7.0 (*)     Base Excess, Ven -22.8 (*)     Carboxyhemoglobin 4.6 (*)     All other components within normal limits    Narrative:  CALL  Ward  SFERF Asher Muir [id number],  .,  09/24/2015 10:37, by Lang Snow  Chemistry results called to and read back by Kerri Perches RN., 10/11/2015  10:12, by COLLU   PROTIME-INR - Abnormal; Notable for the following:     Protime 21.6 (*)     INR 1.91 (*)     All other components within normal limits   APTT - Abnormal; Notable for the following:     aPTT 70.0 (*)     All other components within normal limits   POCT GLUCOSE - Abnormal; Notable for the following:     POC Glucose 320 (*)     All other components within normal limits   CULTURE BLOOD #1   SPECIMEN REJECTION   COMPREHENSIVE METABOLIC PANEL   LACTIC ACID, PLASMA   PREPARE RBC (CROSSMATCH)   PREPARE FRESH FROZEN PLASMA   PREPARE PLATELETS (CROSSMATCH)   ANTIBODY SCREEN   ABO/RH   TYPE AND SCREEN   PREPARE RBC (CROSSMATCH)   PREPARE FRESH FROZEN PLASMA     EKG:  Read by me in the absence of a cardiologist shows:  Sinus tachycardia, rate 133, intervals normal, axis 66??, nonspecific ST-T wave abnormality and motion artifact, prior EKG not available    RADIOLOGY:    Plain x-rays were viewed by me:   XR Chest Portable   Final Result   Supportive tubing projects in normal position.      No pneumonia or pulmonary edema is evident.           ED COURSE:    Medications administered:  Medications   0.9 % sodium chloride bolus (not administered)   EPINEPHrine 5 mg in dextrose 5 % 250 mL infusion (0 mcg/min Intravenous Stopped 09/17/2015 1055)   fentaNYL (SUBLIMAZE) 100 MCG/2ML injection (not administered)   midazolam (VERSED) 2 MG/2ML injection (not administered)   fentaNYL (SUBLIMAZE) 1,000 mcg in sodium chloride 0.9 % 100 mL infusion (0 mcg/kg/hr ?? 40.8 kg Intravenous Stopped 10/05/2015 1055)   norepinephrine (LEVOPHED) infusion (not administered)   sodium bicarbonate 150 mEq in dextrose 5 % 1,000 mL infusion (not administered)   0.9 % sodium chloride infusion 250 mL (not administered)   morphine (PF) injection 10 mg (not administered)   LORazepam (ATIVAN) injection 4 mg (not administered)   LORazepam (ATIVAN) 2 MG/ML  injection (not administered)   EPINEPHrine 0.1 mg/mL injection (1 mg Intravenous Given 09/22/2015 0917)   sodium bicarbonate 8.4 % injection (50 mEq Intravenous Given 10/05/2015 0918)   calcium chloride 10 % injection (1 g Intravenous Given 09/25/2015 0919)     PROCEDURES:  Intubation:  I visualized the vocal cords and passed a 7.5 ET tube through the vocal cords.  I confirmed tube placement with positive result on capnograph, bilateral breath sounds, no sounds over the epigastrium.    CRITICAL CARE:  Total critical care time of 60 minutes (excludes any time for procedures).  This includes but not limited to vital sign monitoring, telemetry monitoring, continuous pulse oximety, medication, clinical response to medications, interpretation of diagnostics, review of nursing notes, pertinent record review, discussions about patient condition, consultation time, documentation time.  Critical care due to the patient's death, hemorrhage.    CONSULTATIONS:  Dr. Roanna Raider    MEDICAL DECISION MAKING: Erica Harding is a 45 y.o. female who presented because of hemorrhage, low hemoglobin, DIC.  She has had multiple medical problems and has been refusing care.  We initially gave aggressive care with CPR, intubation, blood transfusion and blood products  IV with pressors.  She had minimal response with this.  Her sister says that they had planned on taking her out of the nursing home and giving her morphine to make her comfortable and help her with her passing.  They had not officially made her DNR comfort care with her resuscitation status.  After discussion with her sister and the intensivist, the decision was made to withdraw care.  The patient expired peacefully shortly thereafter and I confirmed no heart sounds, no pulse, no pupillary reaction or breathing effort and pronounced her dead while still in the emergency department.     FINAL IMPRESSION:    1. Bleeding    2. Cardiopulmonary arrest [facility])        (Please note that I used voice  recognition software to generate this note.  Occasionally words are mistranscribed despite my efforts to edit errors.)       Dionne Milo, MD  09/17/2015 1356

## 2015-09-13 NOTE — ED Notes (Signed)
Pt's color has improved.  Levophed remains wide open.  Epi infusion continues a 3 ml/hr.  FFP 3rd unit infusing per triple lumen.  Soft restraints remain in place.  Chaplain at bedside.  Pt remains on vent.       Ellis Savage, RN  10/11/2015 1014

## 2015-09-13 NOTE — ED Notes (Signed)
BP 163/86, levophed no longer wide open will now infuse at 6 mcg/kg/min.     Ellis Savage, RN  09/26/2015 [phone]

## 2015-09-13 NOTE — Code Documentation (Signed)
Levophed wide open, bagging pt.

## 2015-09-13 NOTE — ED Notes (Signed)
Ventilator turned off.       Ellis Savage, RN  10/02/2015 [phone]

## 2015-09-13 NOTE — ED Notes (Signed)
Unable to obtain bp.       Ellis Savage, RN  09/30/2015 1122

## 2015-09-13 NOTE — ED Notes (Signed)
Spoke with [employer], pt is not a coroners case.       Ellis Savage, RN  09/21/2015 [phone]

## 2015-09-13 NOTE — ED Notes (Signed)
Pt has a thready pulse rate of 130.       Ellis Savage, RN  10/09/2015 [phone]

## 2015-09-13 NOTE — ED Notes (Signed)
Care team at bedside discussing plan of care options.       Ellis Savage, RN  10/08/2015 [phone]

## 2015-09-13 NOTE — ED Notes (Signed)
BP decreased increasing levophed to 74mcg/kg/min.  Blood x 2 units continues to infuse per [employer]. Epi drip arrived.       Larna Daughters Jennie Bolar, RN  09/14/2015 0930

## 2015-09-13 NOTE — ED Notes (Signed)
Sister would like to use [facility].       Ellis Savage, RN  09/12/2015 1315

## 2015-09-13 NOTE — ED Notes (Signed)
SB rate of 38.       Ellis Savage, RN  09/24/2015 1122

## 2015-09-13 NOTE — ED Notes (Signed)
[facility] notified of pt's passing.  Referral [id number], pt currently on hold per [facility].       Ellis Savage, RN  10/11/2015 [phone]

## 2015-09-13 NOTE — ED Notes (Signed)
Pt moving her left arm up to her head.  Pt back on the vent with previously documented settings.       Ellis Savage, RN  10/05/2015 [phone]

## 2015-09-13 NOTE — Code Documentation (Signed)
Vent settings TV 400, peep of 5, rate of 14, 100%.  Pt is pale, cool to touch.  Bleeding from Foley and abd fistula site. Pt currently residing at [facility] for wound care.  Pt arrived with dextrose infusing per triple lumen.  BS 300.  Dextrose infusion stopped at this time.

## 2015-09-13 NOTE — Code Documentation (Signed)
CPR in progress, pt has blood coming from Foley, abd fistula. STAT O neg ordered from the lab.

## 2015-09-13 NOTE — ED Notes (Signed)
3 unit of FFP now infusing.       Ellis Savage, RN  09/16/2015 1004

## 2015-09-13 NOTE — ED Notes (Signed)
Post mortem care completed by sister at her request this Rn and Chaplain at bedside.       Ellis Savage, RN  09/16/2015 [phone]

## 2015-09-13 NOTE — ED Notes (Signed)
Ativan 4 mg given Iv push.  Sister at bedside with chaplain, agrees with plan.       Larna Daughters Tuere Nwosu, RN  09/19/2015 1110

## 2015-09-13 NOTE — ED Notes (Signed)
Blood x 1 unit infusing wide open, FFP infusing wide open, levophed infusing wide open, pt moving arms, pt grimacing.       Ellis Savage, RN  10/03/2015 [phone]

## 2015-09-13 NOTE — Code Documentation (Signed)
Pt intubated per Dr Sabra Heck, 7.0 ET tube 23 at the teeth, positive color change, bilateral breath sounds per Dr Sabra Heck.

## 2015-09-13 NOTE — ED Notes (Signed)
Dr Sabra Heck at bedside to confirm with this Rn that the pt has passed, no audible heart sounds per this Rn or Dr Sabra Heck.      TOD 1132 per Dr Sabra Heck.       Ellis Savage, RN  10/06/2015 [phone]

## 2015-09-13 NOTE — ED Notes (Signed)
BP further decreased, levophed now infusing wide open.  Epi drip started at 0.2 mcg/kg/min.       Ellis Savage, RN  10/06/2015 [phone]

## 2015-09-13 NOTE — Progress Notes (Signed)
Called for admission after cardiac arrest at ED  Pt is known to our service due to previous admission and has a complex medical hx   There is no work up done at this time and by ED physician report pt is unstable  I've discussed the case with Dr Sabra Heck, and I've requested to stabilize the pt and obtain proper work up before admission   Will go and evaluate pt shortly     Brigitte Pulse MD   10/09/2015   10:10 AM

## 2015-09-13 NOTE — ED Notes (Signed)
Dr Sabra Heck at bedside for update, sister arrived, Dr Sabra Heck to speak with pt's sister.  FFP x 1 unit infused, Levophed infusing wide open, epi infusion remains unchanged.  Sister at bedside.       Ellis Savage, RN  09/12/2015 [phone]

## 2015-09-13 NOTE — ED Notes (Signed)
Second unit of FFP now infusing, second unit of blood infusion now completed.       Ellis Savage, RN  09/16/2015 [phone]

## 2015-09-13 NOTE — ED Notes (Signed)
All infusions stopped at this time.  Verbal order from Dr Roanna Raider for ativan 4 mg IV push, Morphine 10 mg Iv push, then to stop the ventilator and extubate pt in that order.       Ellis Savage, RN  10/11/2015 [phone]

## 2015-09-13 NOTE — ED Notes (Signed)
Morphine 10mg  given Iv push per order.       Larna Daughters Elison Worrel, RN  10/03/2015 1110

## 2015-09-13 NOTE — ED Notes (Signed)
Chaplain, sister and this Rn at bedside.  SR rate of 64.  Agonal respirations.  Providing comfort care.       Larna Daughters Kayvon Mo, RN  10/03/2015 1120

## 2015-09-13 NOTE — Code Documentation (Signed)
Levophed 0.54mcg/kg/min infusion started.

## 2015-09-13 NOTE — ED Notes (Signed)
FFP x 3rd unit infusion completed.       Ellis Savage, RN  09/18/2015 1023

## 2015-09-13 NOTE — Code Documentation (Signed)
Pulse check pulse present rate of 137.  Pt arrives with Central line triple lumen, right IJ.

## 2015-09-13 NOTE — ED Notes (Signed)
Fentanyl 50 mcg given Iv for sedation per verbal order by Dr Sabra Heck.       Ellis Savage, RN  10/08/2015 [phone]

## 2015-09-13 NOTE — ED Notes (Signed)
Dr Roanna Raider at bedside for evaluation, unable to palpate peripheral pulses, even with doppler, audible heart tones per Dr Roanna Raider, will use [place] to obtain ABG.       Ellis Savage, RN  10/11/2015 [phone]

## 2015-09-13 NOTE — ED Notes (Signed)
Pt transported to the morgue by this RN.       Ellis Savage, RN  09/16/2015 [phone]

## 2015-09-13 NOTE — Code Documentation (Signed)
Blood Clot came out of fistula, followed by fluid.

## 2015-09-13 NOTE — ED Notes (Signed)
Versed 2 mg given Iv per verbal order by Dr Sabra Heck for sedation.       Ellis Savage, RN  09/15/2015 [phone]

## 2015-09-13 NOTE — Code Documentation (Signed)
O neg 2 units hung.  Infusing per triple lumen.

## 2015-09-13 NOTE — ED Notes (Signed)
BP improved, levophed wide open.  ST rate of 132.  Unable to obtain a pulse ox due to poor perfusion.  FFP x 1 infusing wide open per triple lumen.  Epi drip continues to infuse.       Ellis Savage, RN  09/17/2015 [phone]

## 2015-09-13 NOTE — ED Notes (Signed)
Pt pulling at ET tube, soft bilateral wrist restraints initiated.       Ellis Savage, RN  10/03/2015 [phone]

## 2015-09-13 NOTE — ED Notes (Signed)
Dr Roanna Raider at bedside with Dr Sabra Heck and sister, decision to withdraw plan of care.       Ellis Savage, RN  09/21/2015 [phone]

## 2015-09-13 NOTE — Code Documentation (Signed)
Blood arrived.  Pt has a pulse rate of 134.  Per EMS Hemoglobin of 4.0 this am.  ST rate of 142.

## 2015-09-14 LAB — CULTURE, BLOOD, PCR ID PANEL RESULTS REPORT

## 2015-09-16 LAB — CULTURE BLOOD #1
Blood Culture, Routine: POSITIVE
Organism: DETECTED — AB

## 2015-09-16 LAB — PERIPHERAL BLOOD SMEAR, PATH REVIEW

## 2015-09-17 ENCOUNTER — Encounter: Attending: Surgery | Primary: Internal Medicine

## 2015-09-24 ENCOUNTER — Ambulatory Visit: Payer: Medicare (Managed Care)

## 2015-10-09 ENCOUNTER — Encounter: Attending: Undersea and Hyperbaric Medicine | Primary: Internal Medicine

## 2015-10-12 DEATH — deceased
